# Patient Record
Sex: Female | Born: 1990 | Race: Black or African American | Hispanic: No | Marital: Single | Smoking: Former smoker
Health system: Southern US, Community
[De-identification: ages and names within clinical notes are randomized; demographics above are authoritative.]

## PROBLEM LIST (undated history)

## (undated) ENCOUNTER — Ambulatory Visit: Admission: EM | Payer: Self-pay

## (undated) DIAGNOSIS — R112 Nausea with vomiting, unspecified: Secondary | ICD-10-CM

## (undated) DIAGNOSIS — I10 Essential (primary) hypertension: Secondary | ICD-10-CM

## (undated) DIAGNOSIS — Z8489 Family history of other specified conditions: Secondary | ICD-10-CM

## (undated) DIAGNOSIS — T8859XA Other complications of anesthesia, initial encounter: Secondary | ICD-10-CM

## (undated) DIAGNOSIS — R51 Headache: Secondary | ICD-10-CM

## (undated) DIAGNOSIS — R9431 Abnormal electrocardiogram [ECG] [EKG]: Secondary | ICD-10-CM

## (undated) DIAGNOSIS — I4891 Unspecified atrial fibrillation: Secondary | ICD-10-CM

## (undated) DIAGNOSIS — Z803 Family history of malignant neoplasm of breast: Secondary | ICD-10-CM

## (undated) DIAGNOSIS — K589 Irritable bowel syndrome without diarrhea: Secondary | ICD-10-CM

## (undated) DIAGNOSIS — Z9889 Other specified postprocedural states: Secondary | ICD-10-CM

## (undated) HISTORY — DX: Morbid (severe) obesity due to excess calories: E66.01

## (undated) HISTORY — PX: TONSILLECTOMY: SUR1361

## (undated) HISTORY — DX: Family history of malignant neoplasm of breast: Z80.3

## (undated) HISTORY — PX: CHOLECYSTECTOMY: SHX55

## (undated) HISTORY — DX: Headache: R51

## (undated) HISTORY — PX: HERNIA REPAIR: SHX51

---

## 2016-12-30 ENCOUNTER — Emergency Department (HOSPITAL_COMMUNITY): Payer: Managed Care, Other (non HMO)

## 2016-12-30 ENCOUNTER — Emergency Department (HOSPITAL_COMMUNITY)
Admission: EM | Admit: 2016-12-30 | Discharge: 2016-12-30 | Disposition: A | Discharge status: Home / Self Care | Payer: Managed Care, Other (non HMO) | Attending: Emergency Medicine | Admitting: Emergency Medicine

## 2016-12-30 ENCOUNTER — Encounter (HOSPITAL_COMMUNITY): Payer: Self-pay | Admitting: *Deleted

## 2016-12-30 DIAGNOSIS — I10 Essential (primary) hypertension: Secondary | ICD-10-CM | POA: Diagnosis not present

## 2016-12-30 DIAGNOSIS — R072 Precordial pain: Secondary | ICD-10-CM | POA: Diagnosis present

## 2016-12-30 DIAGNOSIS — R0602 Shortness of breath: Secondary | ICD-10-CM

## 2016-12-30 DIAGNOSIS — F17200 Nicotine dependence, unspecified, uncomplicated: Secondary | ICD-10-CM | POA: Diagnosis not present

## 2016-12-30 HISTORY — DX: Essential (primary) hypertension: I10

## 2016-12-30 LAB — URINALYSIS, ROUTINE W REFLEX MICROSCOPIC
Bilirubin Urine: NEGATIVE
GLUCOSE, UA: NEGATIVE mg/dL
HGB URINE DIPSTICK: NEGATIVE
Ketones, ur: NEGATIVE mg/dL
LEUKOCYTES UA: NEGATIVE
Nitrite: NEGATIVE
PH: 6 (ref 5.0–8.0)
Protein, ur: NEGATIVE mg/dL
SPECIFIC GRAVITY, URINE: 1.023 (ref 1.005–1.030)

## 2016-12-30 LAB — COMPREHENSIVE METABOLIC PANEL
ALT: 19 U/L (ref 14–54)
AST: 22 U/L (ref 15–41)
Albumin: 3.4 g/dL — ABNORMAL LOW (ref 3.5–5.0)
Alkaline Phosphatase: 70 U/L (ref 38–126)
Anion gap: 9 (ref 5–15)
BILIRUBIN TOTAL: 0.4 mg/dL (ref 0.3–1.2)
BUN: 10 mg/dL (ref 6–20)
CHLORIDE: 104 mmol/L (ref 101–111)
CO2: 28 mmol/L (ref 22–32)
CREATININE: 0.76 mg/dL (ref 0.44–1.00)
Calcium: 9.1 mg/dL (ref 8.9–10.3)
GFR calc Af Amer: >60 mL/min (ref >60)
GFR calc non Af Amer: >60 mL/min (ref >60)
Glucose, Bld: 89 mg/dL (ref 65–99)
Potassium: 4.2 mmol/L (ref 3.5–5.1)
Sodium: 141 mmol/L (ref 135–145)
Total Protein: 7 g/dL (ref 6.5–8.1)

## 2016-12-30 LAB — CBC
HEMATOCRIT: 34.3 % — AB (ref 36.0–46.0)
HEMOGLOBIN: 10.8 g/dL — AB (ref 12.0–15.0)
MCH: 19.3 pg — AB (ref 26.0–34.0)
MCHC: 31.5 g/dL (ref 30.0–36.0)
MCV: 61.4 fL — AB (ref 78.0–100.0)
PLATELETS: 242 10*3/uL (ref 150–400)
RBC: 5.59 MIL/uL — AB (ref 3.87–5.11)
RDW: 16.7 % — ABNORMAL HIGH (ref 11.5–15.5)
WBC: 11.4 10*3/uL — ABNORMAL HIGH (ref 4.0–10.5)

## 2016-12-30 LAB — PROTIME-INR
INR: 1.16
PROTHROMBIN TIME: 14.9 s (ref 11.4–15.2)

## 2016-12-30 LAB — I-STAT TROPONIN, ED: Troponin i, poc: 0 ng/mL (ref 0.00–0.08)

## 2016-12-30 LAB — POC URINE PREG, ED: Preg Test, Ur: NEGATIVE

## 2016-12-30 LAB — APTT: APTT: 32 s (ref 24–36)

## 2016-12-30 LAB — D-DIMER, QUANTITATIVE: D-Dimer, Quant: 0.49 ug/mL-FEU (ref 0.00–0.50)

## 2016-12-30 MED ORDER — NAPROXEN 500 MG PO TABS: 500.0000 mg | ORAL_TABLET | Freq: Two times a day (BID) | ORAL | 0 refills | Status: DC

## 2016-12-30 MED ORDER — ACETAMINOPHEN 325 MG PO TABS
650.0000 mg | ORAL_TABLET | Freq: Once | ORAL | Status: AC
  Administered 2016-12-30: 650 mg via ORAL
  Filled 2016-12-30: qty 2

## 2016-12-30 NOTE — Discharge Instructions (Signed)
Please speak to your doctor about looking for other sources of your chest pain. Perhaps a rheumatologist..Marland Kitchen

## 2016-12-30 NOTE — ED Notes (Signed)
Her doctors are in [place]  No record here

## 2016-12-30 NOTE — ED Triage Notes (Signed)
The pt was sent here for a high d-dimer  That started one month ago  She is having chest pain and sob.  Leg pain one week ago.  She had an elevated d-dimer one month nago and earlier today  lmp last week

## 2016-12-30 NOTE — ED Provider Notes (Signed)
[facility] DEPT Provider Note   CSN: [id number] Arrival date & time: 12/30/16  1541     History   Chief Complaint Chief Complaint  Patient presents with  . Chest Pain    HPI Julie King is a 26 y.o. female.  Julie King is a 26 y.o. Female with a history of hypertension who presents to the emergency department complaining of 2 days of substernal chest pain and shortness of breath. She reports she has had this several times in the past and has been ongoing intermittently for at least a year. She had a d-dimer checked by her primary care provider yesterday and was told it was elevated. She was told to come to the emergency department. She tells me she's had elevated d-dimers before and has had previous CT angiograms of her chest. This was most recently done at an outside facility where we do not have records. She also had a CT angiogram and echo performed at [place] last year that showed no evidence of a PE. Denies ever being diagnosed with a pulmonary embolism. She seen a pulmonologist before and was told she has groundglass opacities. She is a smoker. She denies history of PE or DVT. She reports having some mild right calf pain 3 days ago that has since resolved. She denies history of blood clotting disorder such as factor V Leiden, protein C or S deficiency. She denies endogenous estrogen use, history of cancer or recent long travel. She denies recent surgeries. She denies fevers, hemoptysis, abdominal pain, nausea, vomiting, diarrhea, urinary symptoms, rashes, leg swelling, numbness, tingling or weakness.   The history is provided by the patient, medical records and a friend. No language interpreter was used.  Chest Pain   Associated symptoms include shortness of breath. Pertinent negatives include no abdominal pain, no back pain, no cough, no fever, no headaches, no nausea, no numbness, no palpitations, no vomiting and no weakness.    Past Medical History:    Diagnosis Date  . Hypertension     There are no active problems to display for this patient.   History reviewed. No pertinent surgical history.  OB History    No data available       Home Medications    Prior to Admission medications   Medication Sig Start Date End Date Taking? Authorizing Provider  naproxen (NAPROSYN) 500 MG tablet Take 1 tablet (500 mg total) by mouth 2 (two) times daily with a meal. 12/30/16   Waynetta Pean, PA-C    Family History No family history on file.  Social History Social History  Substance Use Topics  . Smoking status: Current Every Day Smoker  . Smokeless tobacco: Never Used  . Alcohol use No     Allergies   Patient has no known allergies.   Review of Systems Review of Systems  Constitutional: Negative for chills and fever.  HENT: Negative for congestion and sore throat.   Eyes: Negative for visual disturbance.  Respiratory: Positive for shortness of breath. Negative for cough, chest tightness and wheezing.   Cardiovascular: Positive for chest pain. Negative for palpitations and leg swelling.  Gastrointestinal: Negative for abdominal pain, diarrhea, nausea and vomiting.  Genitourinary: Negative for dysuria.  Musculoskeletal: Negative for back pain and neck pain.  Skin: Negative for rash.  Neurological: Negative for syncope, weakness, light-headedness, numbness and headaches.     Physical Exam Updated Vital Signs BP (!) 122/59 (BP Location: Right Arm)   Pulse 82   Temp 98.5 F (  36.9 C)   Resp 18   Ht 5' (1.524 m)   Wt 135.2 kg   LMP 12/26/2016   SpO2 100%   BMI 58.20 kg/m   Physical Exam  Constitutional: She is oriented to person, place, and time. She appears well-developed and well-nourished. No distress.  Nontoxic-appearing. Obese female.  HENT:  Head: Normocephalic and atraumatic.  Mouth/Throat: Oropharynx is clear and moist.  Eyes: Conjunctivae are normal. Pupils are equal, round, and reactive to light. Right  eye exhibits no discharge. Left eye exhibits no discharge.  Neck: Neck supple.  Cardiovascular: Normal rate, regular rhythm, normal heart sounds and intact distal pulses.  Exam reveals no gallop and no friction rub.   No murmur heard. Bilateral radial and posterior tibialis pulses are equal and intact.  Pulmonary/Chest: Effort normal and breath sounds normal. No respiratory distress. She has no wheezes. She has no rales. She exhibits tenderness.  Lungs are clear to ascultation bilaterally. Symmetric chest expansion bilaterally. No increased work of breathing. No rales or rhonchi.  Left chest wall is TTP and somewhat reproduces her chest pain.   Abdominal: Soft. There is no tenderness. There is no guarding.  Musculoskeletal: Normal range of motion. She exhibits no edema or tenderness.  No lower extremity edema or tenderness.  Lymphadenopathy:    She has no cervical adenopathy.  Neurological: She is alert and oriented to person, place, and time. No sensory deficit. Coordination normal.  Skin: Skin is warm and dry. Capillary refill takes less than 2 seconds. No rash noted. She is not diaphoretic. No erythema. No pallor.  Psychiatric: She has a normal mood and affect. Her behavior is normal.  Nursing note and vitals reviewed.    ED Treatments / Results  Labs (all labs ordered are listed, but only abnormal results are displayed) Labs Reviewed  COMPREHENSIVE METABOLIC PANEL - Abnormal; Notable for the following:       Result Value   Albumin 3.4 (*)    All other components within normal limits  CBC - Abnormal; Notable for the following:    WBC 11.4 (*)    RBC 5.59 (*)    Hemoglobin 10.8 (*)    HCT 34.3 (*)    MCV 61.4 (*)    MCH 19.3 (*)    RDW 16.7 (*)    All other components within normal limits  URINALYSIS, ROUTINE W REFLEX MICROSCOPIC  D-DIMER, QUANTITATIVE (NOT AT [facility])  PROTIME-INR  APTT  POC URINE PREG, ED  I-STAT TROPOININ, ED    EKG  EKG  Interpretation  Date/Time:  Friday Dec 30 2016 20:49:18 EDT Ventricular Rate:  91 PR Interval:    QRS Duration: 97 QT Interval:  364 QTC Calculation: 448 R Axis:   72 Text Interpretation:  Sinus rhythm Borderline T abnormalities, inferior leads No significant change since last tracing Confirmed by Winfred Leeds  MD, SAM [phone]) on 12/30/2016 10:43:03 PM       Radiology Dg Chest 2 View  Result Date: 12/30/2016 CLINICAL DATA:  Elevated D-dimer.  Shortness of breath and cough. EXAM: CHEST  2 VIEW COMPARISON:  None. FINDINGS: The heart size and mediastinal contours are within normal limits. Both lungs are clear. The visualized skeletal structures are unremarkable. IMPRESSION: No active cardiopulmonary disease. Electronically Signed   By: Dorise Bullion III M.D   On: 12/30/2016 21:24    Procedures Procedures (including critical care time)  Medications Ordered in ED Medications  acetaminophen (TYLENOL) tablet 650 mg (not administered)     Initial Impression /  Assessment and Plan / ED Course  I have reviewed the triage vital signs and the nursing notes.  Pertinent labs & imaging results that were available during my care of the patient were reviewed by me and considered in my medical decision making (see chart for details).    This  is a 26 y.o. Female with a history of hypertension who presents to the emergency department complaining of 2 days of substernal chest pain and shortness of breath. She reports she has had this several times in the past and has been ongoing intermittently for at least a year. She had a d-dimer checked by her primary care provider yesterday and was told it was elevated. She was told to come to the emergency department. She tells me she's had elevated d-dimers before and has had previous CT angiograms of her chest. This was most recently done at an outside facility where we do not have records. She also had a CT angiogram and echo performed at [place] last year that showed no evidence of a PE. Denies ever being diagnosed with a pulmonary embolism.  She denies any risk factors for a PE.  On exam the patient is afebrile nontoxic appearing. She has no tachypnea, hypoxia or tachycardia on exam. Lungs are clear to auscultation bilaterally. She has some left-sided chest wall tenderness to palpation which somewhat reproduces her pain. She has no lower extremity edema or tenderness. EKG is without acute findings. No evidence of STEMI. Troponin is not elevated. I see no need for repeat troponins patient has been having this pain for more than 24 hours. I also have low suspicion for ACS. Pregnancy test is negative. CMP is unremarkable. CBC is remarkable for a white count of 11,000 and a hgb of 10.8. D-dimer here is nonelevated. Chest x-ray is unremarkable. I reviewed records from [facility] from last year which showed a CT angiogram without evidence of PE. Patient also reports she had a CT Angiogram of her chest this past March which showed no evidence of a PE. Here her d-dimer is not elevated. She has no PE risk factors. She describes a pleuritic-like pain and has pain with palpation of her chest. I question if patient has a rheumatological disorder that has been causing her symptoms of chest pain and shortness of breath for over the past year. Patient reports she has not had workup for this in the past. I would expect this to be her next step for her primary care doctor. Possible referral to rheumatology. I discussed that I did not feel a CT angiogram of her chest was necessary at this time and that the risk with radiation outweighs its benefit. Patient agrees with this plan. Naproxen for pain at home. I discussed strict and specific return precautions with the patient. I advised the patient to follow-up with their primary care provider this week. I advised the patient to return to the emergency department with new or worsening symptoms or new  concerns. The patient verbalized understanding and agreement with plan.    This patient was discussed with Dr. Winfred Leeds who agrees with assessment and plan.   Final Clinical Impressions(s) / ED Diagnoses   Final diagnoses:  Precordial pain  Shortness of breath    New Prescriptions New Prescriptions   NAPROXEN (NAPROSYN) 500 MG TABLET    Take 1 tablet (500 mg total) by mouth 2 (two) times daily with a meal.     Waynetta Pean, PA-C 12/30/16 2243  Orlie Dakin, MD 12/31/16 [phone]

## 2016-12-30 NOTE — ED Notes (Signed)
PT states understanding of care given, follow up care, and medication prescribed. PT ambulated from ED to car with a steady gait. 

## 2017-01-22 ENCOUNTER — Emergency Department (HOSPITAL_COMMUNITY): Payer: Managed Care, Other (non HMO)

## 2017-01-22 ENCOUNTER — Encounter (HOSPITAL_COMMUNITY): Payer: Self-pay

## 2017-01-22 ENCOUNTER — Emergency Department (HOSPITAL_COMMUNITY)
Admission: EM | Admit: 2017-01-22 | Discharge: 2017-01-22 | Disposition: A | Discharge status: Home / Self Care | Payer: Managed Care, Other (non HMO) | Attending: Emergency Medicine | Admitting: Emergency Medicine

## 2017-01-22 DIAGNOSIS — F17210 Nicotine dependence, cigarettes, uncomplicated: Secondary | ICD-10-CM | POA: Insufficient documentation

## 2017-01-22 DIAGNOSIS — I10 Essential (primary) hypertension: Secondary | ICD-10-CM | POA: Diagnosis not present

## 2017-01-22 DIAGNOSIS — R51 Headache: Secondary | ICD-10-CM | POA: Insufficient documentation

## 2017-01-22 DIAGNOSIS — R519 Headache, unspecified: Secondary | ICD-10-CM

## 2017-01-22 LAB — CBC WITH DIFFERENTIAL/PLATELET
BASOS ABS: 0 10*3/uL (ref 0.0–0.1)
Basophils Relative: 0 %
EOS ABS: 0.5 10*3/uL (ref 0.0–0.7)
Eosinophils Relative: 4 %
HEMATOCRIT: 33.2 % — AB (ref 36.0–46.0)
Hemoglobin: 10.5 g/dL — ABNORMAL LOW (ref 12.0–15.0)
LYMPHS ABS: 3.5 10*3/uL (ref 0.7–4.0)
Lymphocytes Relative: 30 %
MCH: 19.4 pg — ABNORMAL LOW (ref 26.0–34.0)
MCHC: 31.6 g/dL (ref 30.0–36.0)
MCV: 61.5 fL — ABNORMAL LOW (ref 78.0–100.0)
MONO ABS: 0.6 10*3/uL (ref 0.1–1.0)
Monocytes Relative: 5 %
NEUTROS ABS: 7 10*3/uL (ref 1.7–7.7)
Neutrophils Relative %: 61 %
PLATELETS: 215 10*3/uL (ref 150–400)
RBC: 5.4 MIL/uL — AB (ref 3.87–5.11)
RDW: 16.3 % — AB (ref 11.5–15.5)
WBC: 11.6 10*3/uL — AB (ref 4.0–10.5)

## 2017-01-22 LAB — I-STAT BETA HCG BLOOD, ED (MC, WL, AP ONLY): I-stat hCG, quantitative: <5 m[IU]/mL (ref <5)

## 2017-01-22 LAB — BASIC METABOLIC PANEL
Anion gap: 5 (ref 5–15)
BUN: 10 mg/dL (ref 6–20)
CALCIUM: 9 mg/dL (ref 8.9–10.3)
CO2: 28 mmol/L (ref 22–32)
CREATININE: 0.72 mg/dL (ref 0.44–1.00)
Chloride: 106 mmol/L (ref 101–111)
GFR calc non Af Amer: >60 mL/min (ref >60)
Glucose, Bld: 102 mg/dL — ABNORMAL HIGH (ref 65–99)
Potassium: 4.3 mmol/L (ref 3.5–5.1)
SODIUM: 139 mmol/L (ref 135–145)

## 2017-01-22 MED ORDER — METOCLOPRAMIDE HCL 5 MG/ML IJ SOLN
10.0000 mg | Freq: Once | INTRAMUSCULAR | Status: AC
  Administered 2017-01-22: 10 mg via INTRAVENOUS
  Filled 2017-01-22: qty 2

## 2017-01-22 MED ORDER — DIPHENHYDRAMINE HCL 50 MG/ML IJ SOLN
25.0000 mg | Freq: Once | INTRAMUSCULAR | Status: AC
  Administered 2017-01-22: 25 mg via INTRAVENOUS
  Filled 2017-01-22: qty 1

## 2017-01-22 MED ORDER — SODIUM CHLORIDE 0.9 % IV BOLUS (SEPSIS)
1000.0000 mL | Freq: Once | INTRAVENOUS | Status: AC
  Administered 2017-01-22: 1000 mL via INTRAVENOUS

## 2017-01-22 NOTE — ED Provider Notes (Signed)
[facility] DEPT Provider Note   CSN: [id number] Arrival date & time: 01/22/17  0255     History   Chief Complaint Chief Complaint  Patient presents with  . Headache    HPI Julie King is a 26 y.o. female.  The history is provided by the patient and medical records.  Headache      26 y.o. F with hx of HTN Presenting to the ED with headache. Patient reports this is been ongoing for about 10 days now. States headache is localized to the top left of her head. States pain is pulsatile and throbbing in nature. She reports associated dizziness that is worse when she comes to complete stop after walking around or driving her car. She's not had any nausea or vomiting. She's not had any numbness or weakness of her arms or legs but does report some tingling in her hands and feet bilaterally. She also reports some "memory issues". States lately she has been very forgetful, specifically forgetting that she is cooking and will burn her food in the kitchen.  States sometimes when talking she is having trouble "getting her words out".  States she has had migraines in the past, but has never been fully diagnosed with them. States usually with headache she does not have these other associated issues. States she has been taking Excedrin, Tylenol, and Motrin which will "take the edge off" but her headache has never fully resolved.  She is not currently on anti-coagulation.  No recent head trauma.  Past Medical History:  Diagnosis Date  . Hypertension     There are no active problems to display for this patient.   History reviewed. No pertinent surgical history.  OB History    No data available       Home Medications    Prior to Admission medications   Medication Sig Start Date End Date Taking? Authorizing Provider  naproxen (NAPROSYN) 500 MG tablet Take 1 tablet (500 mg total) by mouth 2 (two) times daily with a meal. 12/30/16   Waynetta Pean, PA-C    Family History History  reviewed. No pertinent family history.  Social History Social History  Substance Use Topics  . Smoking status: Current Every Day Smoker  . Smokeless tobacco: Never Used  . Alcohol use No     Allergies   Patient has no known allergies.   Review of Systems Review of Systems  Neurological: Positive for dizziness and headaches.       +Memory issues  All other systems reviewed and are negative.    Physical Exam Updated Vital Signs BP 133/71   Pulse 74   Temp 98.7 F (37.1 C) (Oral)   Resp 18   Ht 5\' 7"  (1.702 m)   Wt 133.8 kg (295 lb)   LMP 12/26/2016   SpO2 100%   BMI 46.20 kg/m   Physical Exam  Constitutional: She is oriented to person, place, and time. She appears well-developed and well-nourished. No distress.  HENT:  Head: Normocephalic and atraumatic.  Right Ear: External ear normal.  Left Ear: External ear normal.  Mouth/Throat: Oropharynx is clear and moist.  Eyes: Conjunctivae and EOM are normal. Pupils are equal, round, and reactive to light.  Neck: Normal range of motion and full passive range of motion without pain. Neck supple. No neck rigidity.  No rigidity, no meningismus  Cardiovascular: Normal rate, regular rhythm and normal heart sounds.   No murmur heard. Pulmonary/Chest: Effort normal and breath sounds normal. No respiratory distress. She  has no wheezes. She has no rhonchi.  Abdominal: Soft. Bowel sounds are normal. There is no tenderness. There is no rebound and no guarding.  Musculoskeletal: Normal range of motion. She exhibits no edema.  Neurological: She is alert and oriented to person, place, and time. She has normal strength. She displays no tremor. No cranial nerve deficit or sensory deficit. She displays no seizure activity.  AAOx3, answering questions and following commands appropriately; equal strength UE and LE bilaterally; CN grossly intact; moves all extremities appropriately without ataxia; normal finger to nose and heel to shin  bilaterally; normal rapid alternating movements; normal speech, no apparent expressive aphasia, no facial droop, gait steady  Skin: Skin is warm and dry. No rash noted. She is not diaphoretic.  Psychiatric: She has a normal mood and affect. Her behavior is normal. Thought content normal.  Nursing note and vitals reviewed.    ED Treatments / Results  Labs (all labs ordered are listed, but only abnormal results are displayed) Labs Reviewed  CBC WITH DIFFERENTIAL/PLATELET - Abnormal; Notable for the following:       Result Value   WBC 11.6 (*)    RBC 5.40 (*)    Hemoglobin 10.5 (*)    HCT 33.2 (*)    MCV 61.5 (*)    MCH 19.4 (*)    RDW 16.3 (*)    All other components within normal limits  BASIC METABOLIC PANEL - Abnormal; Notable for the following:    Glucose, Bld 102 (*)    All other components within normal limits  I-STAT BETA HCG BLOOD, ED ([facility], [facility], [facility] ONLY)    EKG  EKG Interpretation None       Radiology Ct Head Wo Contrast  Result Date: 01/22/2017 CLINICAL DATA:  Headache, memory issues, dizziness x 1.5 weeks EXAM: CT HEAD WITHOUT CONTRAST TECHNIQUE: Contiguous axial images were obtained from the base of the skull through the vertex without intravenous contrast. COMPARISON:  None. FINDINGS: Brain: No evidence of acute infarction, hemorrhage, hydrocephalus, extra-axial collection or mass lesion/mass effect. Vascular: No hyperdense vessel or unexpected calcification. Skull: Normal. Negative for fracture or focal lesion. Sinuses/Orbits: The visualized paranasal sinuses are essentially clear. The mastoid air cells are unopacified. Other: None. IMPRESSION: Normal head CT. Electronically Signed   By: Julian Hy M.D.   On: 01/22/2017 07:24    Procedures Procedures (including critical care time)  Medications Ordered in ED Medications  diphenhydrAMINE (BENADRYL) injection 25 mg (not administered)  metoCLOPramide (REGLAN) injection 10 mg (not administered)  sodium  chloride 0.9 % bolus 1,000 mL (not administered)     Initial Impression / Assessment and Plan / ED Course  I have reviewed the triage vital signs and the nursing notes.  Pertinent labs & imaging results that were available during my care of the patient were reviewed by me and considered in my medical decision making (see chart for details).  26 year old female here with headache, dizziness, some memory issues. On exam she is afebrile and nontoxic. Her neurologic exam is nonfocal. She has no signs or symptoms concerning for meningitis. She has no apparent word finding difficulties or trouble with memory recall she is able to give quite an elaborate history. She has been ambulatory with steady gait here.  We'll plan for basic labs, CT head. Migraine cocktail given.  Labwork is overall reassuring. CT head negative for acute findings. Headache resolved after migraine cocktail. Suspect this may be a complicated migraine. She remains neurologically intact here. Will have her follow-up  with outpatient neurology.  Discussed plan with patient, she acknowledged understanding and agreed with plan of care.  Return precautions given for new or worsening symptoms.  Final Clinical Impressions(s) / ED Diagnoses   Final diagnoses:  Nonintractable headache, unspecified chronicity pattern, unspecified headache type    New Prescriptions Discharge Medication List as of 01/22/2017  8:05 AM       Larene Pickett, PA-C 01/22/17 [place], April, MD 01/23/17 0004

## 2017-01-22 NOTE — Discharge Instructions (Signed)
Your lab work and head CT did not show anything emergent or concerning today. Follow-up with neurology if you continue having ongoing symptoms-- call their office to make an appt. Return to the ED for new or worsening symptoms.

## 2017-01-22 NOTE — ED Triage Notes (Signed)
Pt complaining of headache x 2 weeks. Pt states dizziness and lightheadedness x 2 weeks. Pt denies any hx of migraines. Pt denies any chest pain/SOB. Pt a/o x 4 at triage, NAD.

## 2017-02-15 ENCOUNTER — Emergency Department (HOSPITAL_COMMUNITY)
Admission: EM | Admit: 2017-02-15 | Discharge: 2017-02-15 | Disposition: A | Discharge status: Home / Self Care | Payer: Managed Care, Other (non HMO) | Attending: Emergency Medicine | Admitting: Emergency Medicine

## 2017-02-15 ENCOUNTER — Emergency Department (HOSPITAL_COMMUNITY)
Admission: EM | Admit: 2017-02-15 | Discharge: 2017-02-15 | Disposition: A | Discharge status: Home / Self Care | Payer: Managed Care, Other (non HMO) | Source: Home / Self Care

## 2017-02-15 ENCOUNTER — Encounter (HOSPITAL_COMMUNITY): Payer: Self-pay | Admitting: Emergency Medicine

## 2017-02-15 DIAGNOSIS — K0889 Other specified disorders of teeth and supporting structures: Secondary | ICD-10-CM

## 2017-02-15 DIAGNOSIS — I10 Essential (primary) hypertension: Secondary | ICD-10-CM | POA: Insufficient documentation

## 2017-02-15 DIAGNOSIS — Z5321 Procedure and treatment not carried out due to patient leaving prior to being seen by health care provider: Secondary | ICD-10-CM

## 2017-02-15 DIAGNOSIS — F17200 Nicotine dependence, unspecified, uncomplicated: Secondary | ICD-10-CM | POA: Diagnosis not present

## 2017-02-15 MED ORDER — PENICILLIN V POTASSIUM 500 MG PO TABS: 500.0000 mg | ORAL_TABLET | Freq: Four times a day (QID) | ORAL | 0 refills | Status: AC

## 2017-02-15 MED ORDER — HYDROCODONE-ACETAMINOPHEN 5-325 MG PO TABS: 1.0000 | ORAL_TABLET | ORAL | 0 refills | Status: DC | PRN

## 2017-02-15 MED ORDER — PENICILLIN V POTASSIUM 500 MG PO TABS
500.0000 mg | ORAL_TABLET | Freq: Once | ORAL | Status: AC
  Administered 2017-02-15: 500 mg via ORAL
  Filled 2017-02-15: qty 1

## 2017-02-15 NOTE — ED Notes (Signed)
Pt refused vitals stating she was in a hurry and needed to go

## 2017-02-15 NOTE — ED Triage Notes (Signed)
Pt is c/o dental pain  Pt states she has an abscessed tooth on the upper left   Pt states she has taken OTC medication without relief  Pt states the pain goes into her neck and left ear  Pt states it is hard to hear out of her left ear

## 2017-02-15 NOTE — ED Triage Notes (Signed)
Patient with dental pain on left lower jaw.  Face is slightly swollen, she states she has had the same dental infection for the last 5 years.  Patient does have a dentist.  She has taken Excedrin with no help in the pain.

## 2017-02-15 NOTE — Discharge Instructions (Signed)
You have a dental injury/infection. It is very important that you get evaluated by a dentist as soon as possible. Call when they open today to schedule an appointment. Take your full course of antibiotics. Read the instructions below.  Eat a soft or liquid diet and rinse your mouth out after meals with warm water. You should see a dentist or return here at once if you have increased swelling, increased pain or uncontrolled bleeding from the site of your injury.  SEEK MEDICAL CARE IF:  You have increased pain not controlled with medicines.  You have swelling around your tooth, in your face or neck.  You have bleeding which starts, continues, or gets worse.  You have a fever >101 If you are unable to open your mouth

## 2017-02-15 NOTE — ED Provider Notes (Signed)
[facility] DEPT Provider Note   CSN: [id number] Arrival date & time: 02/15/17  0231     History   Chief Complaint Chief Complaint  Patient presents with  . Dental Pain    HPI Julie King is a 26 y.o. female.  The history is provided by the patient and medical records. No language interpreter was used.  Dental Pain      Julie King is a 26 y.o. female who presents to the Emergency Department complaining of  gradually worsening, left-sided, lower dental pain which has been occurring off-and-on for the last 5 years, but acutely worsened last night. Pt describes their pain as throbbing. Pt has been taking excederin at home with minimal relief of pain. They are currently followed by dentistry and plan to call them in the morning to schedule an appointment.  Pt denies facial swelling, fever, chills, difficulty breathing, difficulty swallowing.   Past Medical History:  Diagnosis Date  . Hypertension     There are no active problems to display for this patient.   Past Surgical History:  Procedure Laterality Date  . CESAREAN SECTION    . CHOLECYSTECTOMY    . HERNIA REPAIR    . TONSILLECTOMY      OB History    No data available       Home Medications    Prior to Admission medications   Medication Sig Start Date End Date Taking? Authorizing Provider  albuterol (PROVENTIL HFA;VENTOLIN HFA) 108 (90 Base) MCG/ACT inhaler Inhale 1-2 puffs into the lungs every 6 (six) hours as needed for wheezing or shortness of breath.    [provider]  cholecalciferol (VITAMIN D) 1000 units tablet Take 1,000 Units by mouth daily.    [provider]  HYDROcodone-acetaminophen (NORCO/VICODIN) 5-325 MG tablet Take 1 tablet by mouth every 4 (four) hours as needed. 02/15/17   Ward, Ozella Almond, PA-C  penicillin v potassium (VEETID) 500 MG tablet Take 1 tablet (500 mg total) by mouth 4 (four) times daily. 02/15/17 02/22/17  Ward, Ozella Almond, PA-C    Family  History Family History  Problem Relation Age of Onset  . Hypertension Other   . Diabetes Other     Social History Social History  Substance Use Topics  . Smoking status: Current Every Day Smoker  . Smokeless tobacco: Never Used  . Alcohol use No     Allergies   Patient has no known allergies.   Review of Systems Review of Systems  Constitutional: Negative for chills and fever.  HENT: Positive for dental problem. Negative for facial swelling and trouble swallowing.   Respiratory: Negative for shortness of breath.      Physical Exam Updated Vital Signs BP (!) 150/92 (BP Location: Right Arm)   Pulse 92   Temp 98.4 F (36.9 C) (Oral)   Resp 18   Ht 5\' 7"  (1.702 m)   Wt 127 kg (280 lb)   LMP 02/07/2017 (Exact Date)   SpO2 100%   BMI 43.85 kg/m   Physical Exam  Constitutional: She appears well-developed and well-nourished. No distress.  HENT:  Head: Normocephalic and atraumatic.  Mouth/Throat:    Dental cavities and poor oral dentition noted. Pain along tooth as depicted in image. No abscess noted. Midline uvula. No trismus. OP moist and clear. No oropharyngeal erythema or edema. Neck supple with no tenderness. No facial edema.  Neck: Neck supple.  Cardiovascular: Normal rate, regular rhythm and normal heart sounds.   No murmur heard. Pulmonary/Chest: Effort normal and  breath sounds normal. No respiratory distress. She has no wheezes. She has no rales.  Musculoskeletal: Normal range of motion.  Neurological: She is alert.  Skin: Skin is warm and dry.  Nursing note and vitals reviewed.    ED Treatments / Results  Labs (all labs ordered are listed, but only abnormal results are displayed) Labs Reviewed - No data to display  EKG  EKG Interpretation None       Radiology No results found.  Procedures Procedures (including critical care time)  Medications Ordered in ED Medications  penicillin v potassium (VEETID) tablet 500 mg (not administered)      Initial Impression / Assessment and Plan / ED Course  I have reviewed the triage vital signs and the nursing notes.  Pertinent labs & imaging results that were available during my care of the patient were reviewed by me and considered in my medical decision making (see chart for details).    Julie King is a 26 y.o. female who presents to ED for dental pain. She will be seeing dentistry tomorrow but was not able to tolerate pain through the night, prompting her to come to ED. No abscess requiring immediate incision and drainage. Patient is afebrile, non toxic appearing, and swallowing secretions well. Exam not concerning for Ludwig's angina or pharyngeal abscess. Will treat with VK. Offered dental block for temporary relief but patient declined. She is driving, therefore no pain medication was given. I did give her rx for #2 norco to take when she got home for pain relief until her dentist appointment in the morning. Stressed importance of keeping her appointment tomorrow as well. Patient voices understanding and is agreeable to plan.   Final Clinical Impressions(s) / ED Diagnoses   Final diagnoses:  Dentalgia    New Prescriptions New Prescriptions   HYDROCODONE-ACETAMINOPHEN (NORCO/VICODIN) 5-325 MG TABLET    Take 1 tablet by mouth every 4 (four) hours as needed.   PENICILLIN V POTASSIUM (VEETID) 500 MG TABLET    Take 1 tablet (500 mg total) by mouth 4 (four) times daily.     Ward, Ozella Almond, PA-C 79/48/01 6553    Delora Fuel, MD 74/82/70 [phone]

## 2017-02-16 ENCOUNTER — Emergency Department (HOSPITAL_COMMUNITY)
Admission: EM | Admit: 2017-02-16 | Discharge: 2017-02-16 | Disposition: A | Discharge status: Home / Self Care | Payer: Managed Care, Other (non HMO) | Attending: Emergency Medicine | Admitting: Emergency Medicine

## 2017-02-16 ENCOUNTER — Emergency Department (HOSPITAL_COMMUNITY): Payer: Managed Care, Other (non HMO)

## 2017-02-16 ENCOUNTER — Encounter (HOSPITAL_COMMUNITY): Payer: Self-pay | Admitting: Emergency Medicine

## 2017-02-16 DIAGNOSIS — K0889 Other specified disorders of teeth and supporting structures: Secondary | ICD-10-CM | POA: Diagnosis present

## 2017-02-16 DIAGNOSIS — I10 Essential (primary) hypertension: Secondary | ICD-10-CM | POA: Insufficient documentation

## 2017-02-16 DIAGNOSIS — F17210 Nicotine dependence, cigarettes, uncomplicated: Secondary | ICD-10-CM | POA: Diagnosis not present

## 2017-02-16 DIAGNOSIS — Z7951 Long term (current) use of inhaled steroids: Secondary | ICD-10-CM | POA: Diagnosis not present

## 2017-02-16 NOTE — ED Triage Notes (Signed)
Pt acuity 3 due to neck swelling and difficulty swallowing

## 2017-02-16 NOTE — ED Notes (Signed)
Pt. Transported to xray 

## 2017-02-16 NOTE — Discharge Instructions (Signed)
Please obtain a primary care provider.  Please follow up with your dentist regarding your symptoms.  It may take a day or two for the antibiotics to work.  If you aren't feeling better or get significantly worse please seek additional medical evaluation.    You may have diarrhea from the antibiotics.  Please stay well hydrated and consider probiotics as they may decrease the severity of your diarrhea.  Please be aware that if you take any hormonal contraception (birth control pills, nexplanon, the ring, etc) that your birth control will not work while you are taking antibiotics and you need to use back up protection as directed on the birth control medication information insert.

## 2017-02-16 NOTE — ED Notes (Signed)
PA at the bedside.

## 2017-02-16 NOTE — ED Triage Notes (Signed)
Pt states she has had a dental infection for several years. Went to dentist yesterday for ongoing pain. Dentist gave her antibiotic. Pt woke up today with increased swelling in left lower jaw and into her neck. Pt states it is getting harder to swallow. Airway intact, no difficulty breathing

## 2017-02-16 NOTE — ED Notes (Signed)
Pt. Returned from xray 

## 2017-02-16 NOTE — ED Provider Notes (Signed)
[facility] DEPT Provider Note   CSN: [id number] Arrival date & time: 02/16/17  1041     History   Chief Complaint Chief Complaint  Patient presents with  . Dental Problem    HPI Julie King is a 26 y.o. female Who presents with facial swelling.  She was seen in ED yesterday early morning for Dental pain in her left lower jaw.  She was started on PEN VK and saw a dentist yesterday afternoon.  She reports that they didn't do anything.  She went back to the dentist this morning after she noticed the left side of her face swelling.  She reports she has taken 6 doses of PCN so far.  The dentist "didn't do anything except change my antibiotics to clindamycin."  She has not yet started taking her clindamycin.  She is concerned that she feels like it is hard to swallow and that her swelling is below her jaw line.  She is not drooling, no fevers at home.  No SOB.  HPI  Past Medical History:  Diagnosis Date  . Hypertension     There are no active problems to display for this patient.   Past Surgical History:  Procedure Laterality Date  . CESAREAN SECTION    . CHOLECYSTECTOMY    . HERNIA REPAIR    . TONSILLECTOMY      OB History    No data available       Home Medications    Prior to Admission medications   Medication Sig Start Date End Date Taking? Authorizing Provider  albuterol (PROVENTIL HFA;VENTOLIN HFA) 108 (90 Base) MCG/ACT inhaler Inhale 1-2 puffs into the lungs every 6 (six) hours as needed for wheezing or shortness of breath.    [provider]  cholecalciferol (VITAMIN D) 1000 units tablet Take 1,000 Units by mouth daily.    [provider]  HYDROcodone-acetaminophen (NORCO/VICODIN) 5-325 MG tablet Take 1 tablet by mouth every 4 (four) hours as needed. 02/15/17   Ward, Ozella Almond, PA-C  penicillin v potassium (VEETID) 500 MG tablet Take 1 tablet (500 mg total) by mouth 4 (four) times daily. 02/15/17 02/22/17  Ward, Ozella Almond, PA-C     Family History Family History  Problem Relation Age of Onset  . Hypertension Other   . Diabetes Other     Social History Social History  Substance Use Topics  . Smoking status: Current Every Day Smoker  . Smokeless tobacco: Never Used  . Alcohol use Yes     Allergies   Patient has no known allergies.   Review of Systems Review of Systems  Constitutional: Positive for chills. Negative for fatigue and fever.  HENT: Positive for dental problem, facial swelling and trouble swallowing. Negative for congestion, drooling, sore throat and voice change.   Respiratory: Negative for cough, chest tightness and shortness of breath.   Cardiovascular: Negative for chest pain.  Gastrointestinal: Negative for abdominal pain, diarrhea, nausea and vomiting.  Genitourinary: Negative for difficulty urinating and dysuria.  Musculoskeletal: Negative for arthralgias and myalgias.  Skin: Negative for color change, rash and wound.  Neurological: Negative for dizziness, light-headedness, numbness and headaches.     Physical Exam Updated Vital Signs BP 111/69 (BP Location: Left Arm)   Pulse 73   Temp 98.9 F (37.2 C) (Oral)   Resp 16   Ht 5\' 7"  (1.702 m)   Wt 122.5 kg (270 lb)   LMP 02/07/2017 (Exact Date)   SpO2 100%   BMI 42.29 kg/m  Physical Exam  Constitutional: She appears well-developed and well-nourished. No distress.  HENT:  Head: Atraumatic.  Right Ear: External ear normal.  Left Ear: External ear normal.  Nose: Nose normal.  Mouth/Throat: Oropharynx is clear and moist. No oropharyngeal exudate.  Mild left sided facial swelling that is TTP to left sided face around mandible.  No obvious abscess.  Area is indurated, not fluctuant.  No elevation to floor of mouth.  Patient is speaking clearly, uvula is symmetric.  No neck rigidity.  Trachea is midline, non tender.    Eyes: Conjunctivae are normal. Right eye exhibits no discharge. Left eye exhibits no discharge.  Neck:  Normal range of motion. Neck supple. No JVD present. No tracheal deviation present.  Cardiovascular: Normal rate, regular rhythm, normal heart sounds and intact distal pulses.  Exam reveals no friction rub.   No murmur heard. Pulmonary/Chest: Effort normal and breath sounds normal. No stridor. No respiratory distress.  Abdominal: Soft. She exhibits no distension. There is no tenderness.  Lymphadenopathy:    She has cervical adenopathy (Mild left submandibular).  Neurological: She is alert. She exhibits normal muscle tone.  Skin: Skin is warm and dry. She is not diaphoretic.  Psychiatric: Her behavior is normal.  Nursing note and vitals reviewed.    ED Treatments / Results  Labs (all labs ordered are listed, but only abnormal results are displayed) Labs Reviewed - No data to display  EKG  EKG Interpretation None       Radiology Dg Neck Soft Tissue  Result Date: 02/16/2017 CLINICAL DATA:  Left mandibular mass.  Difficulty swallowing. EXAM: NECK SOFT TISSUES - 1+ VIEW COMPARISON:  None. FINDINGS: The epiglottis and area epiglottic folds appear normal. Slightly prominent lymphoid tissue noted at the base of tongue. No abnormal prevertebral soft tissue swelling or worrisome air collections. The trachea appears normal. The cervical vertebral bodies appear normal. The lung apices are clear. IMPRESSION: Unremarkable soft tissue examination of the neck. Electronically Signed   By: Marijo Sanes M.D.   On: 02/16/2017 12:43    Procedures Procedures (including critical care time)  Medications Ordered in ED Medications - No data to display   Initial Impression / Assessment and Plan / ED Course  I have reviewed the triage vital signs and the nursing notes.  Pertinent labs & imaging results that were available during my care of the patient were reviewed by me and considered in my medical decision making (see chart for details).    Patient with toothache.  No gross abscess.  Exam  unconcerning for Ludwig's angina or deep space infection. Superficial swelling to left face with normal x-ray soft tissue.  No evidence of airway compromise.  Dentist this morning changed antibiotics to clindamycin which I feel is appropriate.  Urged patient to follow-up with dentist.     Final Clinical Impressions(s) / ED Diagnoses   Final diagnoses:  Pain, dental    New Prescriptions Discharge Medication List as of 02/16/2017  1:00 PM       Lorin Glass, PA-C 02/16/17 1322    Lajean Saver, MD 02/16/17 1427

## 2017-02-16 NOTE — ED Notes (Signed)
Pt given [facility] for Follow-up Care

## 2017-03-26 ENCOUNTER — Emergency Department (HOSPITAL_COMMUNITY): Payer: Managed Care, Other (non HMO)

## 2017-03-26 ENCOUNTER — Encounter (HOSPITAL_COMMUNITY): Payer: Self-pay | Admitting: Emergency Medicine

## 2017-03-26 ENCOUNTER — Emergency Department (HOSPITAL_COMMUNITY)
Admission: EM | Admit: 2017-03-26 | Discharge: 2017-03-26 | Disposition: A | Discharge status: Home / Self Care | Payer: Managed Care, Other (non HMO) | Attending: Emergency Medicine | Admitting: Emergency Medicine

## 2017-03-26 DIAGNOSIS — F17210 Nicotine dependence, cigarettes, uncomplicated: Secondary | ICD-10-CM | POA: Insufficient documentation

## 2017-03-26 DIAGNOSIS — I10 Essential (primary) hypertension: Secondary | ICD-10-CM | POA: Insufficient documentation

## 2017-03-26 DIAGNOSIS — N83202 Unspecified ovarian cyst, left side: Secondary | ICD-10-CM | POA: Diagnosis not present

## 2017-03-26 DIAGNOSIS — R102 Pelvic and perineal pain: Secondary | ICD-10-CM | POA: Diagnosis present

## 2017-03-26 DIAGNOSIS — N83209 Unspecified ovarian cyst, unspecified side: Secondary | ICD-10-CM

## 2017-03-26 HISTORY — DX: Irritable bowel syndrome, unspecified: K58.9

## 2017-03-26 LAB — CBC
HEMATOCRIT: 33.7 % — AB (ref 36.0–46.0)
Hemoglobin: 10.8 g/dL — ABNORMAL LOW (ref 12.0–15.0)
MCH: 19.6 pg — ABNORMAL LOW (ref 26.0–34.0)
MCHC: 32 g/dL (ref 30.0–36.0)
MCV: 61.2 fL — AB (ref 78.0–100.0)
Platelets: 195 10*3/uL (ref 150–400)
RBC: 5.51 MIL/uL — AB (ref 3.87–5.11)
RDW: 16.2 % — AB (ref 11.5–15.5)
WBC: 14.2 10*3/uL — AB (ref 4.0–10.5)

## 2017-03-26 LAB — COMPREHENSIVE METABOLIC PANEL
ALBUMIN: 3.6 g/dL (ref 3.5–5.0)
ALT: 18 U/L (ref 14–54)
AST: 31 U/L (ref 15–41)
Alkaline Phosphatase: 63 U/L (ref 38–126)
Anion gap: 9 (ref 5–15)
BUN: 10 mg/dL (ref 6–20)
CHLORIDE: 105 mmol/L (ref 101–111)
CO2: 24 mmol/L (ref 22–32)
CREATININE: 0.83 mg/dL (ref 0.44–1.00)
Calcium: 8.8 mg/dL — ABNORMAL LOW (ref 8.9–10.3)
GFR calc Af Amer: >60 mL/min (ref >60)
GFR calc non Af Amer: >60 mL/min (ref >60)
GLUCOSE: 90 mg/dL (ref 65–99)
POTASSIUM: 5 mmol/L (ref 3.5–5.1)
SODIUM: 138 mmol/L (ref 135–145)
Total Bilirubin: 0.6 mg/dL (ref 0.3–1.2)
Total Protein: 7.2 g/dL (ref 6.5–8.1)

## 2017-03-26 LAB — URINALYSIS, ROUTINE W REFLEX MICROSCOPIC
BACTERIA UA: NONE SEEN
GLUCOSE, UA: NEGATIVE mg/dL
Hgb urine dipstick: NEGATIVE
KETONES UR: NEGATIVE mg/dL
Leukocytes, UA: NEGATIVE
Nitrite: NEGATIVE
Protein, ur: 30 mg/dL — AB
Specific Gravity, Urine: 1.031 — ABNORMAL HIGH (ref 1.005–1.030)
pH: 7 (ref 5.0–8.0)

## 2017-03-26 LAB — LIPASE, BLOOD: Lipase: 33 U/L (ref 11–51)

## 2017-03-26 LAB — PREGNANCY, URINE: Preg Test, Ur: NEGATIVE

## 2017-03-26 MED ORDER — IBUPROFEN 800 MG PO TABS: 800.0000 mg | ORAL_TABLET | Freq: Three times a day (TID) | ORAL | 0 refills | Status: DC | PRN

## 2017-03-26 MED ORDER — SODIUM CHLORIDE 0.9 % IV BOLUS (SEPSIS)
1000.0000 mL | Freq: Once | INTRAVENOUS | Status: AC
  Administered 2017-03-26: 1000 mL via INTRAVENOUS

## 2017-03-26 MED ORDER — SODIUM CHLORIDE 0.9 % IV BOLUS (SEPSIS)
500.0000 mL | Freq: Once | INTRAVENOUS | Status: AC
  Administered 2017-03-26: 500 mL via INTRAVENOUS

## 2017-03-26 MED ORDER — KETOROLAC TROMETHAMINE 30 MG/ML IJ SOLN
30.0000 mg | Freq: Once | INTRAMUSCULAR | Status: AC
  Administered 2017-03-26: 30 mg via INTRAVENOUS
  Filled 2017-03-26: qty 1

## 2017-03-26 MED ORDER — ONDANSETRON HCL 4 MG/2ML IJ SOLN
4.0000 mg | Freq: Once | INTRAMUSCULAR | Status: AC
  Administered 2017-03-26: 4 mg via INTRAVENOUS
  Filled 2017-03-26: qty 2

## 2017-03-26 MED ORDER — IOPAMIDOL (ISOVUE-300) INJECTION 61%
INTRAVENOUS | Status: AC
  Administered 2017-03-26: 100 mL via INTRAVENOUS
  Filled 2017-03-26: qty 100

## 2017-03-26 NOTE — ED Provider Notes (Signed)
[facility] DEPT Provider Note   CSN: [id number] Arrival date & time: 03/26/17  1742     History   Chief Complaint Chief Complaint  Patient presents with  . Rectal Pain  . Dysuria  . Diarrhea  . Pelvic Pain    HPI Jennessa Trigo is a 26 y.o. female.  Patient complains of lower abdominal discomfort since this morning.  No vomiting some nausea   The history is provided by the patient. No language interpreter was used.  Abdominal Pain   This is a new problem. The current episode started 6 to 12 hours ago. The problem occurs constantly. The problem has not changed since onset.The pain is associated with an unknown factor. The pain is located in the RLQ and LUQ. The quality of the pain is aching. The pain is at a severity of 5/10. The pain is moderate. Pertinent negatives include anorexia, diarrhea, frequency, hematuria and headaches. Nothing aggravates the symptoms. Nothing relieves the symptoms.    Past Medical History:  Diagnosis Date  . Hypertension   . Irritable bowel syndrome     There are no active problems to display for this patient.   Past Surgical History:  Procedure Laterality Date  . CESAREAN SECTION    . CHOLECYSTECTOMY    . HERNIA REPAIR    . TONSILLECTOMY      OB History    No data available       Home Medications    Prior to Admission medications   Medication Sig Start Date End Date Taking? Authorizing Provider  albuterol (PROVENTIL HFA;VENTOLIN HFA) 108 (90 Base) MCG/ACT inhaler Inhale 1-2 puffs into the lungs every 6 (six) hours as needed for wheezing or shortness of breath.   Yes [provider]  HYDROcodone-acetaminophen (NORCO/VICODIN) 5-325 MG tablet Take 1 tablet by mouth every 4 (four) hours as needed. Patient not taking: Reported on 03/26/2017 02/15/17   Ward, Ozella Almond, PA-C  ibuprofen (ADVIL,MOTRIN) 800 MG tablet Take 1 tablet (800 mg total) by mouth every 8 (eight) hours as needed for moderate pain. 03/26/17   Milton Ferguson, MD    Family History Family History  Problem Relation Age of Onset  . Hypertension Other   . Diabetes Other     Social History Social History  Substance Use Topics  . Smoking status: Current Every Day Smoker    Packs/day: 0.50    Types: Cigarettes  . Smokeless tobacco: Never Used  . Alcohol use Yes     Allergies   Percocet [oxycodone-acetaminophen]   Review of Systems Review of Systems  Constitutional: Negative for appetite change and fatigue.  HENT: Negative for congestion, ear discharge and sinus pressure.   Eyes: Negative for discharge.  Respiratory: Negative for cough.   Cardiovascular: Negative for chest pain.  Gastrointestinal: Positive for abdominal pain. Negative for anorexia and diarrhea.  Genitourinary: Negative for frequency and hematuria.  Musculoskeletal: Negative for back pain.  Skin: Negative for rash.  Neurological: Negative for seizures and headaches.  Psychiatric/Behavioral: Negative for hallucinations.     Physical Exam Updated Vital Signs BP 98/62 (BP Location: Left Arm)   Pulse 85   Temp 98 F (36.7 C) (Oral)   Resp 20   LMP 03/10/2017 (Exact Date)   SpO2 97%   Physical Exam  Constitutional: She is oriented to person, place, and time. She appears well-developed.  HENT:  Head: Normocephalic.  Eyes: Conjunctivae and EOM are normal. No scleral icterus.  Neck: Neck supple. No thyromegaly present.  Cardiovascular: Normal  rate and regular rhythm.  Exam reveals no gallop and no friction rub.   No murmur heard. Pulmonary/Chest: No stridor. She has no wheezes. She has no rales. She exhibits no tenderness.  Abdominal: She exhibits no distension. There is tenderness. There is no rebound.  Musculoskeletal: Normal range of motion. She exhibits no edema.  Lymphadenopathy:    She has no cervical adenopathy.  Neurological: She is oriented to person, place, and time. She exhibits normal muscle tone. Coordination normal.  Skin: No rash  noted. No erythema.  Psychiatric: She has a normal mood and affect. Her behavior is normal.     ED Treatments / Results  Labs (all labs ordered are listed, but only abnormal results are displayed) Labs Reviewed  COMPREHENSIVE METABOLIC PANEL - Abnormal; Notable for the following:       Result Value   Calcium 8.8 (*)    All other components within normal limits  URINALYSIS, ROUTINE W REFLEX MICROSCOPIC - Abnormal; Notable for the following:    Specific Gravity, Urine 1.031 (*)    Bilirubin Urine SMALL (*)    Protein, ur 30 (*)    Squamous Epithelial / LPF 0-5 (*)    All other components within normal limits  CBC - Abnormal; Notable for the following:    WBC 14.2 (*)    RBC 5.51 (*)    Hemoglobin 10.8 (*)    HCT 33.7 (*)    MCV 61.2 (*)    MCH 19.6 (*)    RDW 16.2 (*)    All other components within normal limits  LIPASE, BLOOD  PREGNANCY, URINE    EKG  EKG Interpretation None       Radiology Ct Abdomen Pelvis W Contrast  Result Date: 03/26/2017 CLINICAL DATA:  26 year female with rectal and pelvic pain. An episode of diarrhea. EXAM: CT ABDOMEN AND PELVIS WITH CONTRAST TECHNIQUE: Multidetector CT imaging of the abdomen and pelvis was performed using the standard protocol following bolus administration of intravenous contrast. CONTRAST:  <See Chart> ISOVUE-300 IOPAMIDOL (ISOVUE-300) INJECTION 61% COMPARISON:  None. FINDINGS: Lower chest: The visualized lung bases are clear. No intra-abdominal free air. Small amount of slightly high attenuating fluid within the pelvis, likely hemorrhagic fluid or blood product. Hepatobiliary: There is apparent mild diffuse fatty infiltration of the liver. There is mild intrahepatic biliary ductal dilatation, likely post cholecystectomy changes. Pancreas: Unremarkable. No pancreatic ductal dilatation or surrounding inflammatory changes. Spleen: Normal in size without focal abnormality. Adrenals/Urinary Tract: The adrenal glands, kidneys,  and visualized ureters are unremarkable. The urinary bladder is collapsed. Stomach/Bowel: Stomach is within normal limits. Appendix appears normal. No evidence of bowel wall thickening, distention, or inflammatory changes. Vascular/Lymphatic: No significant vascular findings are present. No enlarged abdominal or pelvic lymph nodes. Reproductive: The uterus is anteverted and grossly unremarkable. There is a 3.8 x 3.1 cm high attenuating ovoid lesion in the left hemipelvis (series 2, image 69) likely a hemorrhagic cyst or corpus luteum. Other etiologies are not excluded. Further evaluation with pelvic ultrasound is recommended. The patient has a negative pregnancy test and therefore an ectopic pregnancy is not a concern. Other: None Musculoskeletal: No acute or significant osseous findings. IMPRESSION: 1. Small complex fluid within the pelvis, likely related to rupture of a left ovarian hemorrhagic cyst/corpus luteum. Further evaluation with pelvic ultrasound recommended. 2. No bowel obstruction or active inflammation.  Normal appendix. Electronically Signed   By: Anner Crete M.D.   On: 03/26/2017 21:52    Procedures Procedures (including critical  care time)  Medications Ordered in ED Medications  ketorolac (TORADOL) 30 MG/ML injection 30 mg (30 mg Intravenous Given 03/26/17 1832)  ondansetron (ZOFRAN) injection 4 mg (4 mg Intravenous Given 03/26/17 1832)  sodium chloride 0.9 % bolus 500 mL (0 mLs Intravenous Stopped 03/26/17 2024)  sodium chloride 0.9 % bolus 1,000 mL (0 mLs Intravenous Stopped 03/26/17 2157)  iopamidol (ISOVUE-300) 61 % injection (100 mLs Intravenous Contrast Given 03/26/17 2109)     Initial Impression / Assessment and Plan / ED Course  I have reviewed the triage vital signs and the nursing notes.  Pertinent labs & imaging results that were available during my care of the patient were reviewed by me and considered in my medical decision making (see chart for details).      Patient has a ruptured ovarian cyst. She'll be sent home with Motrin and referred to OB/GYN  Final Clinical Impressions(s) / ED Diagnoses   Final diagnoses:  Cyst of ovary, unspecified laterality    New Prescriptions New Prescriptions   IBUPROFEN (ADVIL,MOTRIN) 800 MG TABLET    Take 1 tablet (800 mg total) by mouth every 8 (eight) hours as needed for moderate pain.     Milton Ferguson, MD 03/26/17 2217

## 2017-03-26 NOTE — ED Notes (Signed)
Patient transported to CT 

## 2017-03-26 NOTE — ED Triage Notes (Signed)
Pt c/o rectal and pelvic pain. Pt states she had an episode of diarrhea today. Recently dx with IBS and patient states she has had mucous in stools. Pt denies vomiting / nausea. Pt states she has pelvic burning with urination.

## 2017-03-26 NOTE — Discharge Instructions (Signed)
Follow up with the [facility] in 1-2 weeks

## 2017-03-28 ENCOUNTER — Encounter (HOSPITAL_COMMUNITY): Payer: Self-pay | Admitting: *Deleted

## 2017-03-28 ENCOUNTER — Inpatient Hospital Stay (HOSPITAL_COMMUNITY)
Admission: AD | Admit: 2017-03-28 | Discharge: 2017-03-28 | Disposition: A | Discharge status: Home / Self Care | Payer: Managed Care, Other (non HMO) | Source: Ambulatory Visit | Attending: Family Medicine | Admitting: Family Medicine

## 2017-03-28 DIAGNOSIS — I10 Essential (primary) hypertension: Secondary | ICD-10-CM | POA: Insufficient documentation

## 2017-03-28 DIAGNOSIS — K589 Irritable bowel syndrome without diarrhea: Secondary | ICD-10-CM | POA: Insufficient documentation

## 2017-03-28 DIAGNOSIS — R109 Unspecified abdominal pain: Secondary | ICD-10-CM | POA: Diagnosis present

## 2017-03-28 DIAGNOSIS — Z885 Allergy status to narcotic agent status: Secondary | ICD-10-CM | POA: Diagnosis not present

## 2017-03-28 DIAGNOSIS — N730 Acute parametritis and pelvic cellulitis: Secondary | ICD-10-CM | POA: Diagnosis not present

## 2017-03-28 DIAGNOSIS — F17210 Nicotine dependence, cigarettes, uncomplicated: Secondary | ICD-10-CM | POA: Insufficient documentation

## 2017-03-28 DIAGNOSIS — N739 Female pelvic inflammatory disease, unspecified: Secondary | ICD-10-CM | POA: Insufficient documentation

## 2017-03-28 LAB — URINALYSIS, ROUTINE W REFLEX MICROSCOPIC
BILIRUBIN URINE: NEGATIVE
Glucose, UA: NEGATIVE mg/dL
HGB URINE DIPSTICK: NEGATIVE
Ketones, ur: NEGATIVE mg/dL
LEUKOCYTES UA: NEGATIVE
NITRITE: NEGATIVE
PH: 5 (ref 5.0–8.0)
Protein, ur: NEGATIVE mg/dL
SPECIFIC GRAVITY, URINE: 1.025 (ref 1.005–1.030)

## 2017-03-28 LAB — WET PREP, GENITAL
Clue Cells Wet Prep HPF POC: NONE SEEN
SPERM: NONE SEEN
Trich, Wet Prep: NONE SEEN
WBC WET PREP: NONE SEEN

## 2017-03-28 MED ORDER — DOXYCYCLINE HYCLATE 100 MG PO CAPS: 100.0000 mg | ORAL_CAPSULE | Freq: Two times a day (BID) | ORAL | 0 refills | Status: DC

## 2017-03-28 MED ORDER — TRAMADOL HCL 50 MG PO TABS: 50.0000 mg | ORAL_TABLET | Freq: Four times a day (QID) | ORAL | 0 refills | Status: DC | PRN

## 2017-03-28 MED ORDER — METRONIDAZOLE 500 MG PO TABS: 500.0000 mg | ORAL_TABLET | Freq: Two times a day (BID) | ORAL | 0 refills | Status: DC

## 2017-03-28 MED ORDER — KETOROLAC TROMETHAMINE 30 MG/ML IJ SOLN
30.0000 mg | Freq: Once | INTRAMUSCULAR | Status: AC
  Administered 2017-03-28: 30 mg via INTRAMUSCULAR
  Filled 2017-03-28: qty 1

## 2017-03-28 MED ORDER — CEFTRIAXONE SODIUM 250 MG IJ SOLR
250.0000 mg | Freq: Once | INTRAMUSCULAR | Status: AC
  Administered 2017-03-28: 250 mg via INTRAMUSCULAR
  Filled 2017-03-28: qty 250

## 2017-03-28 NOTE — Progress Notes (Signed)
Pt wants to use the number [phone] for results/future reference.

## 2017-03-28 NOTE — Discharge Instructions (Signed)
Pelvic Inflammatory Disease Pelvic inflammatory disease (PID) refers to an infection in some or all of the female organs. The infection can be in the uterus, ovaries, fallopian tubes, or the surrounding tissues in the pelvis. PID can cause abdominal or pelvic pain that comes on suddenly (acute pelvic pain). PID is a serious infection because it can lead to lasting (chronic) pelvic pain or the inability to have children (infertility). What are the causes? This condition is most often caused by an infection that is spread during sexual contact. However, the infection can also be caused by the normal bacteria that are found in the vaginal tissues if these bacteria travel upward into the reproductive organs. PID can also occur following:  The birth of a baby.  A miscarriage.  An abortion.  Major pelvic surgery.  The use of an intrauterine device (IUD).  A sexual assault.  What increases the risk? This condition is more likely to develop in women who:  Are younger than 26 years of age.  Are sexually active at [facility] age.  Use nonbarrier contraception.  Have multiple sexual partners.  Have sex with someone who has symptoms of an STD (sexually transmitted disease).  Use oral contraception.  At times, certain behaviors can also increase the possibility of getting PID, such as:  Using a vaginal douche.  Having an IUD in place.  What are the signs or symptoms? Symptoms of this condition include:  Abdominal or pelvic pain.  Fever.  Chills.  Abnormal vaginal discharge.  Abnormal uterine bleeding.  Unusual pain shortly after the end of a menstrual period.  Painful urination.  Pain with sexual intercourse.  Nausea and vomiting.  How is this diagnosed? To diagnose this condition, your health care provider will do a physical exam and take your medical history. A pelvic exam typically reveals great tenderness in the uterus and the surrounding pelvic tissues. You may also  have tests, such as:  Lab tests, including a pregnancy test, blood tests, and urine test.  Culture tests of the vagina and cervix to check for an STD.  Ultrasound.  A laparoscopic procedure to look inside the pelvis.  Examining vaginal secretions under a microscope.  How is this treated? Treatment for this condition may involve one or more approaches.  Antibiotic medicines may be prescribed to be taken by mouth.  Sexual partners may need to be treated if the infection is caused by an STD.  For more severe cases, hospitalization may be needed to give antibiotics directly into a vein through an IV tube.  Surgery may be needed if other treatments do not help, but this is rare.  It may take weeks until you are completely well. If you are diagnosed with PID, you should also be checked for human immunodeficiency virus (HIV). Your health care provider may test you for infection again 3 months after treatment. You should not have unprotected sex. Follow these instructions at home:  Take over-the-counter and prescription medicines only as told by your health care provider.  If you were prescribed an antibiotic medicine, take it as told by your health care provider. Do not stop taking the antibiotic even if you start to feel better.  Do not have sexual intercourse until treatment is completed or as told by your health care provider. If PID is confirmed, your recent sexual partners will need treatment, especially if you had unprotected sex.  Keep all follow-up visits as told by your health care provider. This is important. Contact a health care  provider if: °· You have increased or abnormal vaginal discharge. °· Your pain does not improve. °· You vomit. °· You have a fever. °· You cannot tolerate your medicines. °· Your partner has an STD. °· You have pain when you urinate. °Get help right away if: °· You have increased abdominal or pelvic pain. °· You have chills. °· Your symptoms are not  better in 72 hours even with treatment. °This information is not intended to replace advice given to you by your health care provider. Make sure you discuss any questions you have with your health care provider. °Document Released: 08/15/2005 Document Revised: 01/21/2016 Document Reviewed: 09/22/2014 °Elsevier Interactive Patient Education © 2018 [employer] ° °

## 2017-03-28 NOTE — MAU Note (Addendum)
Pt was at the emergency room two days ago and states she has a ruptured cyst says it felt better yesterday but now it feels worse.  Pain on the top and bottom of abdomen. It hurts to drive and walk.  States her pain is a 7/10.  Feels like her abdomen is distended and firm.

## 2017-03-28 NOTE — MAU Note (Signed)
Urine in lab 

## 2017-03-28 NOTE — MAU Provider Note (Signed)
History     CSN: [id number]  Arrival date and time: 03/28/17 1539   First Provider Initiated Contact with Patient 03/28/17 1625      Chief Complaint  Patient presents with  . Abdominal Pain   Nonpregnant female here with abdominal pain. Pain started 3 days ago. At that time it was mostly in the upper right abdomen and right shoulder. One day later the pain moved to the lower abdomen and rectum. Describes as sharp and intermittent. Was seen at ED and CT of abdomen/pelvis showed 3 cm left hemorrhagic cyst or CLC. Pain improved yesterday then became worse today. She used Ibuprofen and had not relief. Denies vaginal discharge. No new partner in 2 years (same sex), however she reports they broke up once and the partner was going to have sex with another person but the pt is unsure if this occurred. No STD hx.    Past Medical History:  Diagnosis Date  . Hypertension   . Irritable bowel syndrome     Past Surgical History:  Procedure Laterality Date  . CESAREAN SECTION    . CHOLECYSTECTOMY    . HERNIA REPAIR    . TONSILLECTOMY      Family History  Problem Relation Age of Onset  . Hypertension Other   . Diabetes Other     Social History  Substance Use Topics  . Smoking status: Current Every Day Smoker    Packs/day: 0.50    Types: Cigarettes  . Smokeless tobacco: Never Used  . Alcohol use Yes    Allergies:  Allergies  Allergen Reactions  . Percocet [Oxycodone-Acetaminophen] Hives and Itching    Prescriptions Prior to Admission  Medication Sig Dispense Refill Last Dose  . albuterol (PROVENTIL HFA;VENTOLIN HFA) 108 (90 Base) MCG/ACT inhaler Inhale 1-2 puffs into the lungs every 6 (six) hours as needed for wheezing or shortness of breath.   Past Month at Unknown time  . Cholecalciferol (VITAMIN D PO) Take 2 capsules by mouth daily.   03/27/2017 at Unknown time  . ibuprofen (ADVIL,MOTRIN) 800 MG tablet Take 1 tablet (800 mg total) by mouth every 8 (eight) hours as needed for  moderate pain. 21 tablet 0 03/28/2017 at Unknown time  . ranitidine (ZANTAC) 150 MG tablet Take 150 mg by mouth 2 (two) times daily as needed for heartburn.   Past Week at Unknown time  . HYDROcodone-acetaminophen (NORCO/VICODIN) 5-325 MG tablet Take 1 tablet by mouth every 4 (four) hours as needed. (Patient not taking: Reported on 03/26/2017) 2 tablet 0 Not Taking at Unknown time    Review of Systems  Constitutional: Negative for fever.  Gastrointestinal: Positive for abdominal pain and nausea. Negative for constipation, diarrhea and vomiting.  Genitourinary: Negative for dysuria, hematuria, urgency and vaginal discharge.   Physical Exam   Blood pressure (!) 146/78, pulse 98, temperature 97.9 F (36.6 C), temperature source Oral, resp. rate 17, last menstrual period 03/10/2017, SpO2 100 %.  Physical Exam  Nursing note and vitals reviewed. Constitutional: She is oriented to person, place, and time. She appears well-developed and well-nourished. No distress.  HENT:  Head: Normocephalic and atraumatic.  Neck: Normal range of motion.  Respiratory: Effort normal. No respiratory distress.  GI: Soft. She exhibits no distension and no mass. There is generalized tenderness. There is no rigidity, no rebound and no guarding.  Genitourinary:  Genitourinary Comments: External: no lesions or erythema Vagina: rugated, pink, moist, scant thin white discharge Uterus: non enlarged, anteverted, + tender, ++ CMT Adnexae: no masses, +  tenderness left, + tenderness right   Musculoskeletal: Normal range of motion.  Neurological: She is alert and oriented to person, place, and time.  Skin: Skin is warm and dry.  Psychiatric: She has a normal mood and affect.   Results for orders placed or performed during the hospital encounter of 03/28/17 (from the past 24 hour(s))  Urinalysis, Routine w reflex microscopic     Status: Abnormal   Collection Time: 03/28/17  3:40 PM  Result Value Ref Range   Color, Urine  YELLOW YELLOW   APPearance HAZY (A) CLEAR   Specific Gravity, Urine 1.025 1.005 - 1.030   pH 5.0 5.0 - 8.0   Glucose, UA NEGATIVE NEGATIVE mg/dL   Hgb urine dipstick NEGATIVE NEGATIVE   Bilirubin Urine NEGATIVE NEGATIVE   Ketones, ur NEGATIVE NEGATIVE mg/dL   Protein, ur NEGATIVE NEGATIVE mg/dL   Nitrite NEGATIVE NEGATIVE   Leukocytes, UA NEGATIVE NEGATIVE   RBC / HPF 0-5 0 - 5 RBC/hpf   WBC, UA 0-5 0 - 5 WBC/hpf   Bacteria, UA RARE (A) NONE SEEN   Squamous Epithelial / LPF 0-5 (A) NONE SEEN   Mucous PRESENT   Wet prep, genital     Status: Abnormal   Collection Time: 03/28/17  4:40 PM  Result Value Ref Range   Yeast Wet Prep HPF POC PRESENT (A) NONE SEEN   Trich, Wet Prep NONE SEEN NONE SEEN   Clue Cells Wet Prep HPF POC NONE SEEN NONE SEEN   WBC, Wet Prep HPF POC NONE SEEN NONE SEEN   Sperm NONE SEEN     MAU Course  Procedures Toradol- declined narcotic (doesn't have ride) Rocephin  MDM Labs ordered and reviewed. Modest improvement of pain. No evidence of acute abdominal process. Will treat PID. Cultures pending. Stable for discharge home.  Assessment and Plan   1. PID (acute pelvic inflammatory disease)    Discharge home Rx Flagyl Rx Doxy Rx Ultram Follow with [facility] as previously scheduled  Allergies as of 03/28/2017      Reactions   Percocet [oxycodone-acetaminophen] Hives, Itching      Medication List    STOP taking these medications   HYDROcodone-acetaminophen 5-325 MG tablet Commonly known as:  NORCO/VICODIN     TAKE these medications   albuterol 108 (90 Base) MCG/ACT inhaler Commonly known as:  PROVENTIL HFA;VENTOLIN HFA Inhale 1-2 puffs into the lungs every 6 (six) hours as needed for wheezing or shortness of breath.   doxycycline 100 MG capsule Commonly known as:  VIBRAMYCIN Take 1 capsule (100 mg total) by mouth 2 (two) times daily.   ibuprofen 800 MG tablet Commonly known as:  ADVIL,MOTRIN Take 1 tablet (800 mg total) by mouth every 8 (eight)  hours as needed for moderate pain.   metroNIDAZOLE 500 MG tablet Commonly known as:  FLAGYL Take 1 tablet (500 mg total) by mouth 2 (two) times daily.   ranitidine 150 MG tablet Commonly known as:  ZANTAC Take 150 mg by mouth 2 (two) times daily as needed for heartburn.   traMADol 50 MG tablet Commonly known as:  ULTRAM Take 1 tablet (50 mg total) by mouth every 6 (six) hours as needed.   VITAMIN D PO Take 2 capsules by mouth daily.      Julianne Handler, CNM 03/28/2017, 4:33 PM

## 2017-03-31 LAB — GC/CHLAMYDIA PROBE AMP (~~LOC~~) NOT AT ARMC
Chlamydia: NEGATIVE
Neisseria Gonorrhea: NEGATIVE

## 2017-04-07 ENCOUNTER — Encounter: Payer: Managed Care, Other (non HMO) | Admitting: Family Medicine

## 2017-04-12 ENCOUNTER — Emergency Department (HOSPITAL_COMMUNITY): Payer: Managed Care, Other (non HMO)

## 2017-04-12 ENCOUNTER — Encounter (HOSPITAL_COMMUNITY): Payer: Self-pay

## 2017-04-12 ENCOUNTER — Emergency Department (HOSPITAL_COMMUNITY)
Admission: EM | Admit: 2017-04-12 | Discharge: 2017-04-12 | Disposition: A | Discharge status: Home / Self Care | Payer: Managed Care, Other (non HMO) | Attending: Emergency Medicine | Admitting: Emergency Medicine

## 2017-04-12 DIAGNOSIS — I10 Essential (primary) hypertension: Secondary | ICD-10-CM | POA: Diagnosis not present

## 2017-04-12 DIAGNOSIS — R079 Chest pain, unspecified: Secondary | ICD-10-CM | POA: Diagnosis not present

## 2017-04-12 DIAGNOSIS — F17210 Nicotine dependence, cigarettes, uncomplicated: Secondary | ICD-10-CM | POA: Insufficient documentation

## 2017-04-12 DIAGNOSIS — R002 Palpitations: Secondary | ICD-10-CM

## 2017-04-12 LAB — CBC WITH DIFFERENTIAL/PLATELET
BASOS PCT: 0 %
Basophils Absolute: 0 10*3/uL (ref 0.0–0.1)
Eosinophils Absolute: 0.5 10*3/uL (ref 0.0–0.7)
Eosinophils Relative: 4 %
HCT: 32.5 % — ABNORMAL LOW (ref 36.0–46.0)
Hemoglobin: 10.4 g/dL — ABNORMAL LOW (ref 12.0–15.0)
LYMPHS ABS: 3.1 10*3/uL (ref 0.7–4.0)
Lymphocytes Relative: 27 %
MCH: 19.4 pg — AB (ref 26.0–34.0)
MCHC: 32 g/dL (ref 30.0–36.0)
MCV: 60.6 fL — ABNORMAL LOW (ref 78.0–100.0)
MONO ABS: 0.6 10*3/uL (ref 0.1–1.0)
Monocytes Relative: 5 %
NEUTROS ABS: 7.2 10*3/uL (ref 1.7–7.7)
Neutrophils Relative %: 64 %
PLATELETS: 196 10*3/uL (ref 150–400)
RBC: 5.36 MIL/uL — ABNORMAL HIGH (ref 3.87–5.11)
RDW: 15.5 % (ref 11.5–15.5)
WBC: 11.4 10*3/uL — ABNORMAL HIGH (ref 4.0–10.5)

## 2017-04-12 LAB — BASIC METABOLIC PANEL
Anion gap: 8 (ref 5–15)
BUN: 8 mg/dL (ref 6–20)
CO2: 24 mmol/L (ref 22–32)
CREATININE: 0.69 mg/dL (ref 0.44–1.00)
Calcium: 8.8 mg/dL — ABNORMAL LOW (ref 8.9–10.3)
Chloride: 109 mmol/L (ref 101–111)
GFR calc Af Amer: >60 mL/min (ref >60)
Glucose, Bld: 112 mg/dL — ABNORMAL HIGH (ref 65–99)
Potassium: 3.6 mmol/L (ref 3.5–5.1)
Sodium: 141 mmol/L (ref 135–145)

## 2017-04-12 LAB — I-STAT TROPONIN, ED: TROPONIN I, POC: 0 ng/mL (ref 0.00–0.08)

## 2017-04-12 LAB — I-STAT BETA HCG BLOOD, ED (MC, WL, AP ONLY): I-stat hCG, quantitative: <5 m[IU]/mL (ref <5)

## 2017-04-12 LAB — D-DIMER, QUANTITATIVE: D-Dimer, Quant: 1.08 ug/mL-FEU — ABNORMAL HIGH (ref 0.00–0.50)

## 2017-04-12 MED ORDER — IOPAMIDOL (ISOVUE-370) INJECTION 76%
INTRAVENOUS | Status: AC
  Administered 2017-04-12: 75 mL via INTRAVENOUS
  Filled 2017-04-12: qty 100

## 2017-04-12 NOTE — ED Notes (Signed)
Patient transported to CT 

## 2017-04-12 NOTE — ED Triage Notes (Signed)
[facility]- pt coming from home c/o chest pressure since Monday. Pt had PICC line placed last Thursday for colonoscopy and began having palpitations. Pt reports CP started Monday and has gradually gotten worse. PICC line still in place.

## 2017-04-12 NOTE — Discharge Instructions (Addendum)
Your work-up today was negative. I recommend that you follow-up with your primary care doctor or cardiology.  Follow up for central line removal.  Return here for any new/worsening symptoms.

## 2017-04-12 NOTE — ED Provider Notes (Signed)
5:19 PM Patient signed out to me at shift change. Patient emergency department with chest pain, workup was initiated by PA Quincy Carnes. Patient had elevated d-dimer, CT angiogram was pending at time of sign out.  Patient CT angios negative. Discussed other possibilities for chest pain including musculoskeletal, pericarditis, costochondritis. She is low risk for ACS, EKG and negative troponin here in emergency department. In with a PICC line in the right arm, requesting it to be taken out. A To her that she will need to follow-up with her doctor to get that out. She was a guest under impression that we were going to pull it out here. I explained to her that we do not do that, and that I am not even sure if they will need to access it anymore. We will change the dressing which could appears to be old and dirty. I will have her call her doctor tomorrow. Patient agrees to the plan.  Vitals:   04/12/17 1545 04/12/17 1600 04/12/17 1615 04/12/17 1630  BP: 106/70 97/65 (!) 95/56 (!) 94/50  Pulse: 80 78 83 80  Resp: 19 13 (!) 21 20  Temp:      TempSrc:      SpO2: 99% 100% 100% 98%      Jeannett Senior, PA-C 04/12/17 1724    Lacretia Leigh, MD 04/13/17 2203

## 2017-04-12 NOTE — ED Provider Notes (Signed)
[facility] DEPT Provider Note   CSN: [id number] Arrival date & time: 04/12/17  1234     History   Chief Complaint Chief Complaint  Patient presents with  . Chest Pain    HPI Julie King is a 26 y.o. female.  The history is provided by the patient and medical records.  Chest Pain      26 year old female with history of hypertension and IBS, presenting to the ED with chest pain and palpitations. Patient reports last Thursday (6 days ago) she had a PICC line placed as she was due for colonoscopy.  Reports she gets too dehydrated from the prep so PICC line is needed for continued IVF during this period.  States immediately after the PICC line was placed she began having some palpitations in the parking lot. States she called the doctor on call and was told to go to the ED, but she decided to go home. States over the weekend she developed some chest pain. States is localized to the center of her chest with some radiation to the back. Reports increased pain with deep breathing, movement, coughing. No pain into the left arm or jaw. States pain is somewhat constant, associated with intermittent shortness of breath. No diaphoresis, nausea, or vomiting. Patient reports some cardiac "issues" in the past. States she has seen a cardiologist several years ago, has been trying to follow-up but has had to cancel her appointments. She does have family history on her maternal grandmother side. Patient is a daily smoker.  Past Medical History:  Diagnosis Date  . Hypertension   . Irritable bowel syndrome     There are no active problems to display for this patient.   Past Surgical History:  Procedure Laterality Date  . CESAREAN SECTION    . CHOLECYSTECTOMY    . HERNIA REPAIR    . TONSILLECTOMY      OB History    Gravida Para Term Preterm AB Living   1 1 1     1    SAB TAB Ectopic Multiple Live Births           1       Home Medications    Prior to Admission medications     Medication Sig Start Date End Date Taking? Authorizing Provider  albuterol (PROVENTIL HFA;VENTOLIN HFA) 108 (90 Base) MCG/ACT inhaler Inhale 1-2 puffs into the lungs every 6 (six) hours as needed for wheezing or shortness of breath.    [provider]  Cholecalciferol (VITAMIN D PO) Take 2 capsules by mouth daily.    [provider]  doxycycline (VIBRAMYCIN) 100 MG capsule Take 1 capsule (100 mg total) by mouth 2 (two) times daily. 03/28/17   Julianne Handler, CNM  ibuprofen (ADVIL,MOTRIN) 800 MG tablet Take 1 tablet (800 mg total) by mouth every 8 (eight) hours as needed for moderate pain. 03/26/17   Milton Ferguson, MD  metroNIDAZOLE (FLAGYL) 500 MG tablet Take 1 tablet (500 mg total) by mouth 2 (two) times daily. 03/28/17   Julianne Handler, CNM  ranitidine (ZANTAC) 150 MG tablet Take 150 mg by mouth 2 (two) times daily as needed for heartburn.    [provider]  traMADol (ULTRAM) 50 MG tablet Take 1 tablet (50 mg total) by mouth every 6 (six) hours as needed. 03/28/17 03/28/18  Julianne Handler, CNM    Family History Family History  Problem Relation Age of Onset  . Hypertension Other   . Diabetes Other     Social History Social History  Substance Use Topics  . Smoking status: Current Every Day Smoker    Packs/day: 0.50    Types: Cigarettes  . Smokeless tobacco: Never Used  . Alcohol use Yes     Allergies   Percocet [oxycodone-acetaminophen]   Review of Systems Review of Systems  Cardiovascular: Positive for chest pain.     Physical Exam Updated Vital Signs BP 115/62 (BP Location: Left Arm)   Pulse 98   Temp 98.6 F (37 C) (Oral)   Resp 16   LMP 04/09/2017 (Within Days)   SpO2 100%   Physical Exam  Constitutional: She is oriented to person, place, and time. She appears well-developed and well-nourished.  HENT:  Head: Normocephalic and atraumatic.  Mouth/Throat: Oropharynx is clear and moist.  Eyes: Pupils are equal, round, and reactive  to light. Conjunctivae and EOM are normal.  Neck: Normal range of motion.  Cardiovascular: Normal rate, regular rhythm and normal heart sounds.   Pulmonary/Chest: Effort normal and breath sounds normal.  No acute distress, speaking in full sentences, chest wall non-tender  Abdominal: Soft. Bowel sounds are normal.  Musculoskeletal: Normal range of motion.  Right upper arm with PICC line in place; bandage and dressing appears dirty (does not appear to have been changed since placement); no drainage or erythema noted, no warmth to touch  Neurological: She is alert and oriented to person, place, and time.  Skin: Skin is warm and dry.  Psychiatric: She has a normal mood and affect.  Nursing note and vitals reviewed.    ED Treatments / Results  Labs (all labs ordered are listed, but only abnormal results are displayed) Labs Reviewed  CBC WITH DIFFERENTIAL/PLATELET - Abnormal; Notable for the following:       Result Value   WBC 11.4 (*)    RBC 5.36 (*)    Hemoglobin 10.4 (*)    HCT 32.5 (*)    MCV 60.6 (*)    MCH 19.4 (*)    All other components within normal limits  BASIC METABOLIC PANEL - Abnormal; Notable for the following:    Glucose, Bld 112 (*)    Calcium 8.8 (*)    All other components within normal limits  D-DIMER, QUANTITATIVE (NOT AT [facility]) - Abnormal; Notable for the following:    D-Dimer, Quant 1.08 (*)    All other components within normal limits  I-STAT TROPONIN, ED  I-STAT BETA HCG BLOOD, ED ([facility], [facility], [facility] ONLY)    EKG  EKG Interpretation  Date/Time:  Wednesday April 12 2017 12:56:30 EDT Ventricular Rate:  95 PR Interval:    QRS Duration: 100 QT Interval:  340 QTC Calculation: 428 R Axis:   54 Text Interpretation:  Sinus rhythm Nonspecific T abnormalities, inferior leads similar to previous tracing from 5/18 Confirmed by Virgel Manifold [phone]) on 04/12/2017 1:35:20 PM       Radiology Dg Chest 2 View  Result Date: 04/12/2017 CLINICAL DATA:  26 y/o  F;  chest pain and shortness of breath. EXAM: CHEST  2 VIEW COMPARISON:  12/30/2016 chest radiograph FINDINGS: Stable heart size and mediastinal contours are within normal limits. Both lungs are clear. The visualized skeletal structures are unremarkable. Right PICC line catheter tip projects over lower SVC. IMPRESSION: No active cardiopulmonary disease. Electronically Signed   By: Kristine Garbe M.D.   On: 04/12/2017 14:54    Procedures Procedures (including critical care time)  Medications Ordered in ED Medications - No data to display   Initial Impression / Assessment and Plan /  ED Course  I have reviewed the triage vital signs and the nursing notes.  Pertinent labs & imaging results that were available during my care of the patient were reviewed by me and considered in my medical decision making (see chart for details).  26 year old female here with chest pain and palpitations. Recent PICC line placed for IV hydration during colonoscopy prep. Reports chest pain developed a few days after. She is afebrile and nontoxic. PICC line in right upper extremity with dirty bandage that does not appear to have been changed, however no drainage or erythema of the site.  Heart rate is within normal limits. Normal sinus rhythm. EKG nonischemic, some T-wave changes that are very similar to prior EKG.  Lab work overall reassuring, troponin negative.  D-dimer is elevated at 1.08. Chest x-ray is clear.  Will plan for CTA of chest to evaluate for PE.  4:02 PM CTA pending.  Care signed out to oncoming provider.  If scan is negative, feel patient can be discharged home to follow-up with PCP.  Final Clinical Impressions(s) / ED Diagnoses   Final diagnoses:  Chest pain, unspecified type  Palpitations    New Prescriptions New Prescriptions   No medications on file     Kathryne Hitch 04/12/17 1602    Virgel Manifold, MD 04/13/17 [phone]

## 2017-04-20 ENCOUNTER — Encounter (HOSPITAL_COMMUNITY): Payer: Self-pay | Admitting: Nurse Practitioner

## 2017-04-20 ENCOUNTER — Emergency Department (HOSPITAL_COMMUNITY)
Admission: EM | Admit: 2017-04-20 | Discharge: 2017-04-21 | Disposition: A | Discharge status: Home / Self Care | Payer: Managed Care, Other (non HMO) | Attending: Emergency Medicine | Admitting: Emergency Medicine

## 2017-04-20 ENCOUNTER — Emergency Department (HOSPITAL_COMMUNITY): Payer: Managed Care, Other (non HMO)

## 2017-04-20 DIAGNOSIS — I10 Essential (primary) hypertension: Secondary | ICD-10-CM | POA: Diagnosis not present

## 2017-04-20 DIAGNOSIS — R079 Chest pain, unspecified: Secondary | ICD-10-CM

## 2017-04-20 DIAGNOSIS — F17210 Nicotine dependence, cigarettes, uncomplicated: Secondary | ICD-10-CM | POA: Diagnosis not present

## 2017-04-20 DIAGNOSIS — Z79899 Other long term (current) drug therapy: Secondary | ICD-10-CM | POA: Insufficient documentation

## 2017-04-20 DIAGNOSIS — I4891 Unspecified atrial fibrillation: Secondary | ICD-10-CM | POA: Insufficient documentation

## 2017-04-20 DIAGNOSIS — R0789 Other chest pain: Secondary | ICD-10-CM | POA: Diagnosis present

## 2017-04-20 DIAGNOSIS — R42 Dizziness and giddiness: Secondary | ICD-10-CM | POA: Insufficient documentation

## 2017-04-20 DIAGNOSIS — Z7982 Long term (current) use of aspirin: Secondary | ICD-10-CM | POA: Diagnosis not present

## 2017-04-20 DIAGNOSIS — R002 Palpitations: Secondary | ICD-10-CM | POA: Insufficient documentation

## 2017-04-20 HISTORY — DX: Abnormal electrocardiogram (ECG) (EKG): R94.31

## 2017-04-20 LAB — CBC
HCT: 33.8 % — ABNORMAL LOW (ref 36.0–46.0)
Hemoglobin: 10.7 g/dL — ABNORMAL LOW (ref 12.0–15.0)
MCH: 19.1 pg — ABNORMAL LOW (ref 26.0–34.0)
MCHC: 31.7 g/dL (ref 30.0–36.0)
MCV: 60.2 fL — ABNORMAL LOW (ref 78.0–100.0)
Platelets: 189 10*3/uL (ref 150–400)
RBC: 5.61 MIL/uL — ABNORMAL HIGH (ref 3.87–5.11)
RDW: 15.7 % — ABNORMAL HIGH (ref 11.5–15.5)
WBC: 12.1 10*3/uL — ABNORMAL HIGH (ref 4.0–10.5)

## 2017-04-20 LAB — BASIC METABOLIC PANEL WITH GFR
Anion gap: 9 (ref 5–15)
BUN: 8 mg/dL (ref 6–20)
CO2: 22 mmol/L (ref 22–32)
Calcium: 8.7 mg/dL — ABNORMAL LOW (ref 8.9–10.3)
Chloride: 104 mmol/L (ref 101–111)
Creatinine, Ser: 0.65 mg/dL (ref 0.44–1.00)
GFR calc Af Amer: >60 mL/min
GFR calc non Af Amer: >60 mL/min
Glucose, Bld: 87 mg/dL (ref 65–99)
Potassium: 3.9 mmol/L (ref 3.5–5.1)
Sodium: 135 mmol/L (ref 135–145)

## 2017-04-20 LAB — RAPID URINE DRUG SCREEN, HOSP PERFORMED
Amphetamines: NOT DETECTED
BARBITURATES: NOT DETECTED
BENZODIAZEPINES: NOT DETECTED
Cocaine: NOT DETECTED
Opiates: NOT DETECTED
TETRAHYDROCANNABINOL: NOT DETECTED

## 2017-04-20 LAB — I-STAT TROPONIN, ED: TROPONIN I, POC: 0 ng/mL (ref 0.00–0.08)

## 2017-04-20 LAB — I-STAT BETA HCG BLOOD, ED (MC, WL, AP ONLY)

## 2017-04-20 LAB — MAGNESIUM: MAGNESIUM: 1.8 mg/dL (ref 1.7–2.4)

## 2017-04-20 MED ORDER — METOPROLOL TARTRATE 5 MG/5ML IV SOLN: 5.0000 mg | INTRAVENOUS | Status: DC | PRN

## 2017-04-20 MED ORDER — METOPROLOL TARTRATE 25 MG PO TABS
25.0000 mg | ORAL_TABLET | Freq: Once | ORAL | Status: AC
  Administered 2017-04-20: 25 mg via ORAL
  Filled 2017-04-20: qty 1

## 2017-04-20 MED ORDER — ASPIRIN 81 MG PO CHEW
324.0000 mg | CHEWABLE_TABLET | Freq: Once | ORAL | Status: AC
Start: 2017-04-20 — End: 2017-04-20
  Administered 2017-04-20: 324 mg via ORAL
  Filled 2017-04-20: qty 4

## 2017-04-20 NOTE — ED Provider Notes (Signed)
[facility] DEPT Provider Note   CSN: [id number] Arrival date & time: 04/20/17  2030     History   Chief Complaint Chief Complaint  Patient presents with  . Shortness of Breath    HPI Julie King is a 26 y.o. female.  HPI Pt comes in with cc of palpitations. Pt has hx of IBS, Abnormal EKG (prolonged QT, short PR, TWI). She reports that 2 hours ago she started having sudden palpitations. Pt is having chest pressure and feeling dizzy. Pt reports that she has had similar symptoms in the past on multiple occasion, but they typically resolve in a minute or sometimes seconds. The current  Symptoms are constant.  Pt has no hx of PE, DVT and denies any exogenous hormone (testosterone / estrogen) use, long distance travels or surgery in the past 6 weeks, active cancer, recent immobilization. Pt denies any drug use, heavy caffeine use. Pt has no family hx of arrhythmia.   Past Medical History:  Diagnosis Date  . Hypertension   . Irritable bowel syndrome   . QT prolongation   . T wave inversion in EKG     There are no active problems to display for this patient.   Past Surgical History:  Procedure Laterality Date  . CESAREAN SECTION    . CHOLECYSTECTOMY    . HERNIA REPAIR    . TONSILLECTOMY      OB History    Gravida Para Term Preterm AB Living   1 1 1     1    SAB TAB Ectopic Multiple Live Births           1       Home Medications    Prior to Admission medications   Medication Sig Start Date End Date Taking? Authorizing Provider  aspirin EC 325 MG tablet Take 1 tablet (325 mg total) by mouth daily. 04/21/17   Varney Biles, MD  doxycycline (VIBRAMYCIN) 100 MG capsule Take 1 capsule (100 mg total) by mouth 2 (two) times daily. Patient not taking: Reported on 04/12/2017 03/28/17   Julianne Handler, CNM  ibuprofen (ADVIL,MOTRIN) 800 MG tablet Take 1 tablet (800 mg total) by mouth every 8 (eight) hours as needed for moderate pain. Patient not taking: Reported on  04/20/2017 03/26/17   Milton Ferguson, MD  metoprolol tartrate (LOPRESSOR) 25 MG tablet Take 1 tablet (25 mg total) by mouth 2 (two) times daily. 04/21/17   Varney Biles, MD  metroNIDAZOLE (FLAGYL) 500 MG tablet Take 1 tablet (500 mg total) by mouth 2 (two) times daily. Patient not taking: Reported on 04/12/2017 03/28/17   Julianne Handler, CNM  traMADol (ULTRAM) 50 MG tablet Take 1 tablet (50 mg total) by mouth every 6 (six) hours as needed. Patient not taking: Reported on 04/20/2017 03/28/17 03/28/18  Julianne Handler, CNM    Family History Family History  Problem Relation Age of Onset  . Hypertension Other   . Diabetes Other     Social History Social History  Substance Use Topics  . Smoking status: Current Every Day Smoker    Packs/day: 0.50    Types: Cigarettes  . Smokeless tobacco: Never Used  . Alcohol use Yes     Comment: rare     Allergies   Percocet [oxycodone-acetaminophen]   Review of Systems Review of Systems  Constitutional: Positive for activity change.  Cardiovascular: Positive for chest pain and palpitations.  All other systems reviewed and are negative.    Physical Exam Updated Vital Signs BP 108/80  Pulse (!) 125   Temp 98.2 F (36.8 C) (Oral)   Resp (!) 24   Ht 5\' 7"  (1.702 m)   Wt 131.5 kg (290 lb)   LMP 04/09/2017 (Within Days)   SpO2 100%   BMI 45.42 kg/m   Physical Exam  Constitutional: She is oriented to person, place, and time. She appears well-developed and well-nourished.  HENT:  Head: Normocephalic and atraumatic.  Eyes: Pupils are equal, round, and reactive to light. EOM are normal.  Neck: Neck supple.  Cardiovascular: Normal heart sounds.   No murmur heard. Tachycardia, irregular  Pulmonary/Chest: Effort normal. No respiratory distress.  Abdominal: Soft. She exhibits no distension. There is no tenderness. There is no rebound and no guarding.  Musculoskeletal: She exhibits no edema or tenderness.  Neurological: She is alert and  oriented to person, place, and time.  Skin: Skin is warm and dry.  Nursing note and vitals reviewed.    ED Treatments / Results  Labs (all labs ordered are listed, but only abnormal results are displayed) Labs Reviewed  BASIC METABOLIC PANEL - Abnormal; Notable for the following:       Result Value   Calcium 8.7 (*)    All other components within normal limits  CBC - Abnormal; Notable for the following:    WBC 12.1 (*)    RBC 5.61 (*)    Hemoglobin 10.7 (*)    HCT 33.8 (*)    MCV 60.2 (*)    MCH 19.1 (*)    RDW 15.7 (*)    All other components within normal limits  MAGNESIUM  RAPID URINE DRUG SCREEN, HOSP PERFORMED  MAGNESIUM  TSH  I-STAT TROPONIN, ED  I-STAT BETA HCG BLOOD, ED ([facility], [facility], [facility] ONLY)    EKG  EKG Interpretation  Date/Time:  Thursday April 20 2017 20:44:05 EDT Ventricular Rate:  104 PR Interval:    QRS Duration: 88 QT Interval:  340 QTC Calculation: 447 R Axis:   73 Text Interpretation:  Atrial fibrillation with rapid ventricular response Nonspecific T wave abnormality Abnormal ECG afib is new Confirmed by Varney Biles [phone]) on 04/20/2017 8:47:55 PM       Radiology Dg Chest 2 View  Result Date: 04/20/2017 CLINICAL DATA:  27 y/o  F; 2 hours of shortness of breath. EXAM: CHEST  2 VIEW COMPARISON:  04/12/2017 chest radiograph FINDINGS: Stable heart size and mediastinal contours are within normal limits. Right PICC line tip projects over lower SVC. Both lungs are clear. The visualized skeletal structures are unremarkable. IMPRESSION: No active cardiopulmonary disease. Electronically Signed   By: Kristine Garbe M.D.   On: 04/20/2017 21:35    Procedures Procedures (including critical care time)  Medications Ordered in ED Medications  metoprolol tartrate (LOPRESSOR) tablet 25 mg (25 mg Oral Given 04/20/17 2245)  aspirin chewable tablet 324 mg (324 mg Oral Given 04/20/17 2245)     Initial Impression / Assessment and Plan / ED Course  I  have reviewed the triage vital signs and the nursing notes.  Pertinent labs & imaging results that were available during my care of the patient were reviewed by me and considered in my medical decision making (see chart for details).  Clinical Course as of Apr 21 34  Fri Apr 21, 2017  0031 Pt's HR is still fluctuating. At rest her HR is in the 80s and 90s, but when she walks, her heart rate jumps up to 120s - and that's when she is symptomatic. We gave oral metoprolol  already. We discussed with the patient that from medical perspective, we would like her to be admitted for symptomatic atrial fibrillation. Pt however has 3 kids, and doesn't think she can stay. She asked me the if it is safe for her to go home. I informed her that overall AF in a young and otherwise healthy patient doesn't pose any imminent danger, however, in her case the heart rate can still jump up and cause her to get dizzy or faint, or with new meds her BP can drop and she can faint - so there are some potential risks by going home.  Pt took 15 minutes to discuss her options with her significant other and she decided to go home.  I will start her on metoprolol 25 mg bid. Strict ER return precautions have been discussed, and patient is agreeing with the plan and is comfortable with the workup done and the recommendations from the ER.  Pt advised not to drive or engage in exertional activities.  [AN]    Clinical Course User Index [AN] Varney Biles, MD    Pt has symptomatic AF. She seems to have been having intermittent episodes for a long while now, but they are intermittent and short acting. I spoke with Cardiology, they dont think pt is a good candidate of EM cardioversion given the intermittent episodes.  No PE risk factors and recent neg CT PE. She has had some QTc prolongation and short PR interval hx - but doesn't seem that is directly related.. Labs ordered. Metoprolol ivp 5 mg ordered.   CHA2DS2/VAS Stroke  Risk Points: 0    Final Clinical Impressions(s) / ED Diagnoses   Final diagnoses:  New onset a-fib ([place])  Chest pain, unspecified type    New Prescriptions New Prescriptions   ASPIRIN EC 325 MG TABLET    Take 1 tablet (325 mg total) by mouth daily.   METOPROLOL TARTRATE (LOPRESSOR) 25 MG TABLET    Take 1 tablet (25 mg total) by mouth 2 (two) times daily.     Varney Biles, MD 04/21/17 [phone]

## 2017-04-20 NOTE — ED Notes (Signed)
IV team came and assessed PICC line states okay to use

## 2017-04-20 NOTE — ED Triage Notes (Signed)
Per EMS pt was sitting in car and had an episode of shortness of breath and palpitations- notes she has had this episode before but this was the longest ever lasting approximately 45 min so she called EMS. Pt noted to be in a.fib rate 90-160bpm- no history of such. Pt has appointment for holter monitor tomorrow. Pt denies pain, nausea.

## 2017-04-20 NOTE — ED Notes (Signed)
Pt to xray

## 2017-04-21 ENCOUNTER — Telehealth (HOSPITAL_COMMUNITY): Payer: Self-pay | Admitting: *Deleted

## 2017-04-21 MED ORDER — METOPROLOL TARTRATE 25 MG PO TABS: 25.0000 mg | ORAL_TABLET | Freq: Two times a day (BID) | ORAL | 0 refills | Status: DC

## 2017-04-21 MED ORDER — ASPIRIN EC 325 MG PO TBEC
325.0000 mg | DELAYED_RELEASE_TABLET | Freq: Every day | ORAL | 0 refills | Status: DC
Start: 2017-04-21 — End: 2018-05-21

## 2017-04-21 NOTE — Discharge Instructions (Signed)
Please see the afib team next week.  Start taking the medicine prescribed.  If you start having chest pain, dizziness, fainting, shortness of breath - return to the ER.

## 2017-04-21 NOTE — ED Notes (Addendum)
Pt verbalizes understanding of d/c instructions. Pt being taken home by family member. Encouraged pt to follow up and take prescribed medications. Pt A&O and ambulatory at discharge.

## 2017-04-21 NOTE — Telephone Encounter (Signed)
Attempted to contact pt x 2.  Phone line rings until silent, no ans. No machine.  Tried to contact pt mother since listed on contacts.  LM on vcml on mothers line

## 2017-04-21 NOTE — Telephone Encounter (Signed)
Pt cld back and was given an appt for Tuesday at 9:00 am. Pt asked if she should return to the ER over the weekend should her heart rate race like last night.  She had rates in the 170s per pt but recorded in ER at 125.  Pt was advised should she start to "race" become dizzy or feel tightness in her chest then she should return to ER over weekend.  Pt understood and will continue medications.  She was given directions to afib clinic.  Pt understood

## 2017-04-21 NOTE — ED Notes (Signed)
Pt ambulatory to restroom

## 2017-04-23 ENCOUNTER — Emergency Department (HOSPITAL_COMMUNITY)
Admission: EM | Admit: 2017-04-23 | Discharge: 2017-04-23 | Disposition: A | Discharge status: Home / Self Care | Payer: Managed Care, Other (non HMO) | Attending: Emergency Medicine | Admitting: Emergency Medicine

## 2017-04-23 ENCOUNTER — Telehealth: Payer: Self-pay | Admitting: Physician Assistant

## 2017-04-23 ENCOUNTER — Encounter (HOSPITAL_COMMUNITY): Payer: Self-pay | Admitting: Nurse Practitioner

## 2017-04-23 DIAGNOSIS — I10 Essential (primary) hypertension: Secondary | ICD-10-CM | POA: Diagnosis not present

## 2017-04-23 DIAGNOSIS — Z885 Allergy status to narcotic agent status: Secondary | ICD-10-CM | POA: Insufficient documentation

## 2017-04-23 DIAGNOSIS — Z79899 Other long term (current) drug therapy: Secondary | ICD-10-CM | POA: Diagnosis not present

## 2017-04-23 DIAGNOSIS — Z7982 Long term (current) use of aspirin: Secondary | ICD-10-CM | POA: Insufficient documentation

## 2017-04-23 DIAGNOSIS — F17210 Nicotine dependence, cigarettes, uncomplicated: Secondary | ICD-10-CM | POA: Diagnosis not present

## 2017-04-23 DIAGNOSIS — R002 Palpitations: Secondary | ICD-10-CM | POA: Diagnosis not present

## 2017-04-23 HISTORY — DX: Unspecified atrial fibrillation: I48.91

## 2017-04-23 NOTE — ED Notes (Signed)
Pt wants blood draw from pic line.

## 2017-04-23 NOTE — ED Triage Notes (Signed)
Pt presents with c/o palpitations. The palpitations have been intermittent since she was discharged from the emergency department 2 days ago for new onset atrial fibrillation. She reports weakness and chest pain. She has an appointment with a-fib clinic on Tuesday.

## 2017-04-23 NOTE — Telephone Encounter (Signed)
Pt called because she is feeling worse again. She was in the ER with afib 08/23, was referred to the Afib clinic. Has appt 08/28  Felt ok yesterday, but today feels worse. She is tired and weak. She is SOB with exertion. She is feeling  She has a PICC line, is supposed to get it out tomorrow. It was put in because she gets very dehydrated when she preps for a colonoscopy.  She has to give her daughter meds for seizures, so has constraints there also.  Advised pt that the afib is probably causing her sx, requested she go to the ER.  Pt reluctant, but says will probably go.  Rosaria Ferries, Hershal Coria 04/23/2017 10:57 AM Beeper [phone]

## 2017-04-23 NOTE — ED Notes (Signed)
Pt reports feeling weak and having palpitations at approximately 1000hrs this date. Pt states she is feeling better and does not want to stay. Spoke with EDP about seeing the pt prior to her leaving.

## 2017-04-23 NOTE — ED Notes (Signed)
Pt requests labs drawn from PICC line

## 2017-04-25 ENCOUNTER — Encounter (HOSPITAL_COMMUNITY): Payer: Self-pay | Admitting: Nurse Practitioner

## 2017-04-25 ENCOUNTER — Ambulatory Visit (HOSPITAL_COMMUNITY)
Admission: RE | Admit: 2017-04-25 | Discharge: 2017-04-25 | Disposition: A | Discharge status: Home / Self Care | Payer: Managed Care, Other (non HMO) | Source: Ambulatory Visit | Attending: Nurse Practitioner | Admitting: Nurse Practitioner

## 2017-04-25 VITALS — BP 124/70 | HR 113 | Ht 67.0 in | Wt 294.8 lb

## 2017-04-25 DIAGNOSIS — Z8249 Family history of ischemic heart disease and other diseases of the circulatory system: Secondary | ICD-10-CM | POA: Diagnosis not present

## 2017-04-25 DIAGNOSIS — I10 Essential (primary) hypertension: Secondary | ICD-10-CM | POA: Diagnosis not present

## 2017-04-25 DIAGNOSIS — F17210 Nicotine dependence, cigarettes, uncomplicated: Secondary | ICD-10-CM | POA: Insufficient documentation

## 2017-04-25 DIAGNOSIS — Z7982 Long term (current) use of aspirin: Secondary | ICD-10-CM | POA: Diagnosis not present

## 2017-04-25 DIAGNOSIS — Z885 Allergy status to narcotic agent status: Secondary | ICD-10-CM | POA: Insufficient documentation

## 2017-04-25 DIAGNOSIS — I480 Paroxysmal atrial fibrillation: Secondary | ICD-10-CM

## 2017-04-25 DIAGNOSIS — Z9049 Acquired absence of other specified parts of digestive tract: Secondary | ICD-10-CM | POA: Insufficient documentation

## 2017-04-25 DIAGNOSIS — Z833 Family history of diabetes mellitus: Secondary | ICD-10-CM | POA: Diagnosis not present

## 2017-04-25 DIAGNOSIS — Z9889 Other specified postprocedural states: Secondary | ICD-10-CM | POA: Insufficient documentation

## 2017-04-25 DIAGNOSIS — K589 Irritable bowel syndrome without diarrhea: Secondary | ICD-10-CM | POA: Diagnosis not present

## 2017-04-25 DIAGNOSIS — Z6841 Body Mass Index (BMI) 40.0 and over, adult: Secondary | ICD-10-CM | POA: Diagnosis not present

## 2017-04-25 MED ORDER — DILTIAZEM HCL 30 MG PO TABS: ORAL_TABLET | ORAL | 1 refills | Status: DC

## 2017-04-25 NOTE — Patient Instructions (Signed)
Your physician has recommended you make the following change in your medication:  1)Cardizem 30mg  -- take 1 tablet every 4 hours AS NEEDED for AFIB rapid heart rate over 100 as long as top number if blood pressure over 100.

## 2017-04-25 NOTE — Progress Notes (Signed)
Primary Care Physician: System, Pcp Not In Referring Physician: [facility] ER    Julie King is a 26 y.o. female with a h/o afib with visits to [facility] ER  8/23 and 8/26, found to be in afib. H/o HTN, IBS, morbid obesity, tobacco abuse. She is in SR today.   Gives h/o snoring, morning H/A's, daytime somnolence. Had rectal bleeding 3 weeks ago and due to easy  dehydration form prep has pic line inserted. Colonoscopy was benign without signs of bleeding.  Denies alcohol or excessive caffeine. Has chadsvasc score of 1 for female. On baby asa daily. On metoprolol since ER visit. Has felt a few fast beats since  then.  Today, she denies symptoms of palpitations, chest pain, shortness of breath, orthopnea, PND, lower extremity edema, dizziness, presyncope, syncope, or neurologic sequela. The patient is tolerating medications without difficulties and is otherwise without complaint today.   Past Medical History:  Diagnosis Date  . Atrial fibrillation ([place])   . Hypertension   . Irritable bowel syndrome   . QT prolongation   . T wave inversion in EKG    Past Surgical History:  Procedure Laterality Date  . CESAREAN SECTION    . CHOLECYSTECTOMY    . HERNIA REPAIR    . TONSILLECTOMY      Current Outpatient Prescriptions  Medication Sig Dispense Refill  . albuterol (PROVENTIL HFA;VENTOLIN HFA) 108 (90 Base) MCG/ACT inhaler Inhale into the lungs every 6 (six) hours as needed for wheezing or shortness of breath.    Marland Kitchen aspirin EC 325 MG tablet Take 1 tablet (325 mg total) by mouth daily. 30 tablet 0  . metoprolol tartrate (LOPRESSOR) 25 MG tablet Take 1 tablet (25 mg total) by mouth 2 (two) times daily. 20 tablet 0  . diltiazem (CARDIZEM) 30 MG tablet Take 1 tablet every 4 hours AS NEEDED for AFIB rapid heart rate 45 tablet 1  . ibuprofen (ADVIL,MOTRIN) 800 MG tablet Take 1 tablet (800 mg total) by mouth every 8 (eight) hours as needed for moderate pain. (Patient not taking: Reported on 04/25/2017) 21  tablet 0  . traMADol (ULTRAM) 50 MG tablet Take 1 tablet (50 mg total) by mouth every 6 (six) hours as needed. (Patient not taking: Reported on 04/25/2017) 20 tablet 0   No current facility-administered medications for this encounter.     Allergies  Allergen Reactions  . Percocet [Oxycodone-Acetaminophen] Hives and Itching    Social History   Social History  . Marital status: Single    Spouse name: N/A  . Number of children: N/A  . Years of education: N/A   Occupational History  . Not on file.   Social History Main Topics  . Smoking status: Current Every Day Smoker    Packs/day: 0.50    Types: Cigarettes  . Smokeless tobacco: Never Used  . Alcohol use Yes     Comment: rare  . Drug use: No  . Sexual activity: Not on file   Other Topics Concern  . Not on file   Social History Narrative  . No narrative on file    Family History  Problem Relation Age of Onset  . Hypertension Other   . Diabetes Other     ROS- All systems are reviewed and negative except as per the HPI above  Physical Exam: Vitals:   04/25/17 0922  BP: 124/70  Pulse: (!) 113  Weight: 294 lb 12.8 oz (133.7 kg)  Height: 5\' 7"  (1.702 m)   Wt Readings from  Last 3 Encounters:  04/25/17 294 lb 12.8 oz (133.7 kg)  04/23/17 280 lb (127 kg)  04/20/17 290 lb (131.5 kg)    Labs: Lab Results  Component Value Date   NA 135 04/20/2017   K 3.9 04/20/2017   CL 104 04/20/2017   CO2 22 04/20/2017   GLUCOSE 87 04/20/2017   BUN 8 04/20/2017   CREATININE 0.65 04/20/2017   CALCIUM 8.7 (L) 04/20/2017   MG 1.8 04/20/2017   Lab Results  Component Value Date   INR 1.16 12/30/2016   No results found for: CHOL, HDL, LDLCALC, TRIG   GEN- The patient is well appearing, alert and oriented x 3 today.   Head- normocephalic, atraumatic Eyes-  Sclera clear, conjunctiva pink Ears- hearing intact Oropharynx- clear Neck- supple, no JVP Lymph- no cervical lymphadenopathy Lungs- Clear to ausculation  bilaterally, normal work of breathing Heart- Regular rate and rhythm, no murmurs, rubs or gallops, PMI not laterally displaced GI- soft, NT, ND, + BS Extremities- no clubbing, cyanosis, or edema MS- no significant deformity or atrophy Skin- no rash or lesion Psych- euthymic mood, full affect Neuro- strength and sensation are intact  EKG- Sinus tach at 113 bpm, Pr int 122 ms, qrs int 88 ms, qtc 452 ms Epic records reviewed    Assessment and Plan: 1. Paroxymal afib General eduction re afib Continue metoprolol as prescribed Will rx 30 mg cardizem as needed for a HR above 100 in afib and a systolic BP of at least 865. Echo ordered  2. Lifestyle issues Pt states that she has gained to her current weight since pregnancy 5 years ago Weight loss encouraged Exercise encouraged  Snores with apnea spells Sleep study ordered Encouraged to stop smoking    F/u in afib clinic in 3 weeks  Butch Penny C. Carroll, [facility] [address] [place], [place] [address] [phone]

## 2017-04-25 NOTE — ED Provider Notes (Signed)
[facility] DEPT Provider Note   CSN: [id number] Arrival date & time: 04/23/17  1729     History   Chief Complaint Chief Complaint  Patient presents with  . Palpitations    HPI Julie King is a 26 y.o. female.  HPI Patient was seen on 04/20/17 and diagnosed with new onset atrial fibrillation. Patient was started on metoprolol and given follow-up atrial fibrillation clinic. The patient presents with episodic palpitations starting this morning. Associated with mild fatigue. She currently is asymptomatic. She denies any chest pain or shortness of breath. Neurologic signs remained stable. She is currently in normal sinus rhythm. She does not want any further workup and would like to be discharged to follow-up in the atrial fibrillation clinic in 2 days. Past Medical History:  Diagnosis Date  . Atrial fibrillation ([place])   . Hypertension   . Irritable bowel syndrome   . QT prolongation   . T wave inversion in EKG     There are no active problems to display for this patient.   Past Surgical History:  Procedure Laterality Date  . CESAREAN SECTION    . CHOLECYSTECTOMY    . HERNIA REPAIR    . TONSILLECTOMY      OB History    Gravida Para Term Preterm AB Living   1 1 1     1    SAB TAB Ectopic Multiple Live Births           1       Home Medications    Prior to Admission medications   Medication Sig Start Date End Date Taking? Authorizing Provider  albuterol (PROVENTIL HFA;VENTOLIN HFA) 108 (90 Base) MCG/ACT inhaler Inhale into the lungs every 6 (six) hours as needed for wheezing or shortness of breath.   Yes [provider]  aspirin EC 325 MG tablet Take 1 tablet (325 mg total) by mouth daily. 04/21/17  Yes Varney Biles, MD  metoprolol tartrate (LOPRESSOR) 25 MG tablet Take 1 tablet (25 mg total) by mouth 2 (two) times daily. 04/21/17  Yes Varney Biles, MD  diltiazem (CARDIZEM) 30 MG tablet Take 1 tablet every 4 hours AS NEEDED for AFIB rapid heart rate  04/25/17   Sherran Needs, NP  ibuprofen (ADVIL,MOTRIN) 800 MG tablet Take 1 tablet (800 mg total) by mouth every 8 (eight) hours as needed for moderate pain. Patient not taking: Reported on 04/25/2017 03/26/17   Milton Ferguson, MD  traMADol (ULTRAM) 50 MG tablet Take 1 tablet (50 mg total) by mouth every 6 (six) hours as needed. Patient not taking: Reported on 04/25/2017 03/28/17 03/28/18  Julianne Handler, CNM    Family History Family History  Problem Relation Age of Onset  . Hypertension Other   . Diabetes Other     Social History Social History  Substance Use Topics  . Smoking status: Current Every Day Smoker    Packs/day: 0.50    Types: Cigarettes  . Smokeless tobacco: Never Used  . Alcohol use Yes     Comment: rare     Allergies   Percocet [oxycodone-acetaminophen]   Review of Systems Review of Systems  Constitutional: Positive for fatigue. Negative for chills and fever.  Respiratory: Negative for cough and shortness of breath.   Cardiovascular: Positive for palpitations. Negative for chest pain and leg swelling.  Gastrointestinal: Negative for abdominal pain, diarrhea, nausea and vomiting.  Musculoskeletal: Negative for back pain, myalgias, neck pain and neck stiffness.  Skin: Negative for rash and wound.  Neurological: Negative for  dizziness, weakness, light-headedness, numbness and headaches.  All other systems reviewed and are negative.    Physical Exam Updated Vital Signs BP 122/81   Pulse 79   Temp 98.2 F (36.8 C) (Oral)   Resp 11   Ht 5\' 7"  (1.702 m)   Wt 127 kg (280 lb)   LMP 04/09/2017 (Within Days)   SpO2 100%   BMI 43.85 kg/m   Physical Exam  Constitutional: She is oriented to person, place, and time. She appears well-developed and well-nourished. No distress.  HENT:  Head: Normocephalic and atraumatic.  Mouth/Throat: Oropharynx is clear and moist. No oropharyngeal exudate.  Eyes: Pupils are equal, round, and reactive to light. EOM are  normal.  Neck: Normal range of motion. Neck supple. No thyromegaly present.  Cardiovascular: Normal rate and regular rhythm.  Exam reveals no gallop and no friction rub.   No murmur heard. Pulmonary/Chest: Effort normal and breath sounds normal. No respiratory distress. She has no wheezes. She has no rales. She exhibits no tenderness.  Abdominal: Soft. Bowel sounds are normal. There is no tenderness. There is no rebound and no guarding.  Musculoskeletal: Normal range of motion. She exhibits no edema or tenderness.  No lower extremity swelling, asymmetry or tenderness.  Neurological: She is alert and oriented to person, place, and time.  Skin: Skin is warm and dry. No rash noted. No erythema.  Psychiatric: She has a normal mood and affect. Her behavior is normal.  Nursing note and vitals reviewed.    ED Treatments / Results  Labs (all labs ordered are listed, but only abnormal results are displayed) Labs Reviewed - No data to display  EKG  EKG Interpretation  Date/Time:  Sunday April 23 2017 17:36:29 EDT Ventricular Rate:  98 PR Interval:  126 QRS Duration: 90 QT Interval:  352 QTC Calculation: 449 R Axis:   37 Text Interpretation:  Normal sinus rhythm Nonspecific T wave abnormality Abnormal ECG Confirmed by Lita Mains  MD, Kolton Kienle ([id number]) on 04/23/2017 7:33:41 PM Also confirmed by Lita Mains  MD, Nathanial Arrighi ([id number]), editor Radene Gunning [phone])  on 04/24/2017 8:02:43 AM       Radiology No results found.  Procedures Procedures (including critical care time)  Medications Ordered in ED Medications - No data to display   Initial Impression / Assessment and Plan / ED Course  I have reviewed the triage vital signs and the nursing notes.  Pertinent labs & imaging results that were available during my care of the patient were reviewed by me and considered in my medical decision making (see chart for details).     Patient with recent extensive workup for new onset atrial  fibrillation. She currently is asymptomatic. She does not want further workup at this time. Has close follow-up in atrial fibrillation clinic. She's been given strict return precautions has voiced understanding.  Final Clinical Impressions(s) / ED Diagnoses   Final diagnoses:  Palpitations    New Prescriptions Discharge Medication List as of 04/23/2017  7:57 PM       Julianne Rice, MD 04/25/17 1559

## 2017-04-28 ENCOUNTER — Telehealth: Payer: Self-pay | Admitting: *Deleted

## 2017-04-28 DIAGNOSIS — I4891 Unspecified atrial fibrillation: Secondary | ICD-10-CM

## 2017-04-28 NOTE — Telephone Encounter (Signed)
Informed patient of upcoming sleep study and patient understanding was verbalized. Patient understands her sleep study is scheduled for Tuesday June 20 2017. Patient understands her sleep study will be done at [facility] sleep lab. Patient understands she will receive a sleep packet in a week or so. Patient understands to call if she does not receive the sleep packet in a timely manner. Patient agrees with treatment and thanked me for call.

## 2017-04-28 NOTE — Telephone Encounter (Signed)
-----   Message from Juluis Mire, RN sent at 04/25/2017 11:06 AM EDT ----- Regarding: sleep study Pt needs sleep study for afib. Pt will be expecting call. Thanks Stacy :)

## 2017-05-02 ENCOUNTER — Ambulatory Visit (HOSPITAL_COMMUNITY)
Admission: RE | Admit: 2017-05-02 | Discharge: 2017-05-02 | Disposition: A | Discharge status: Home / Self Care | Payer: Managed Care, Other (non HMO) | Source: Ambulatory Visit | Attending: Nurse Practitioner | Admitting: Nurse Practitioner

## 2017-05-02 DIAGNOSIS — I480 Paroxysmal atrial fibrillation: Secondary | ICD-10-CM

## 2017-05-02 DIAGNOSIS — I4891 Unspecified atrial fibrillation: Secondary | ICD-10-CM | POA: Diagnosis present

## 2017-05-02 NOTE — Progress Notes (Signed)
  Echocardiogram 2D Echocardiogram has been performed.  Julie King 05/02/2017, 12:15 PM

## 2017-05-13 ENCOUNTER — Telehealth: Payer: Self-pay | Admitting: Physician Assistant

## 2017-05-13 NOTE — Telephone Encounter (Signed)
Patient called answering service. Pt reports feeling generally poor today, also has noticed increased palpitations like she is in atrial fib, HR 90s, BP also lower than usual 90/78. She checked this at [facility] so validity not clear. She reports feeling more SOB than usual as well. It is unclear to me why this 26 year old has developed paroxysmal atrial fib; I am concerned with her increased symptoms, recent abnormal CBC with microcytic anemia and leukocytosis that there is more significant underlying picture contributing to her increased arrhythmia burden. In light of increased sx I advised her to proceed to ER for further evaluation. She is currently near [facility] and plans to proceed there.  Serenity Batley PA-C

## 2017-05-16 ENCOUNTER — Ambulatory Visit (HOSPITAL_COMMUNITY)
Admission: RE | Admit: 2017-05-16 | Discharge: 2017-05-16 | Disposition: A | Discharge status: Home / Self Care | Payer: Managed Care, Other (non HMO) | Source: Ambulatory Visit | Attending: Nurse Practitioner | Admitting: Nurse Practitioner

## 2017-05-16 VITALS — BP 112/80 | HR 93 | Ht 67.0 in | Wt 292.4 lb

## 2017-05-16 DIAGNOSIS — Z79899 Other long term (current) drug therapy: Secondary | ICD-10-CM | POA: Diagnosis not present

## 2017-05-16 DIAGNOSIS — I10 Essential (primary) hypertension: Secondary | ICD-10-CM | POA: Insufficient documentation

## 2017-05-16 DIAGNOSIS — I480 Paroxysmal atrial fibrillation: Secondary | ICD-10-CM | POA: Diagnosis not present

## 2017-05-16 DIAGNOSIS — Z6841 Body Mass Index (BMI) 40.0 and over, adult: Secondary | ICD-10-CM | POA: Insufficient documentation

## 2017-05-16 DIAGNOSIS — K589 Irritable bowel syndrome without diarrhea: Secondary | ICD-10-CM | POA: Diagnosis not present

## 2017-05-16 DIAGNOSIS — Z885 Allergy status to narcotic agent status: Secondary | ICD-10-CM | POA: Insufficient documentation

## 2017-05-16 DIAGNOSIS — Z7982 Long term (current) use of aspirin: Secondary | ICD-10-CM | POA: Diagnosis not present

## 2017-05-16 DIAGNOSIS — I4891 Unspecified atrial fibrillation: Secondary | ICD-10-CM | POA: Diagnosis present

## 2017-05-16 DIAGNOSIS — F17210 Nicotine dependence, cigarettes, uncomplicated: Secondary | ICD-10-CM | POA: Insufficient documentation

## 2017-05-16 DIAGNOSIS — R0683 Snoring: Secondary | ICD-10-CM | POA: Insufficient documentation

## 2017-05-16 NOTE — Progress Notes (Signed)
Primary Care Physician: System, Pcp Not In Referring Physician: [facility]    Julie King is a 26 y.o. female with a h/o afib with visits to [facility] ER  8/23 and 8/26, found to be in afib. H/o HTN, IBS, morbid obesity, tobacco abuse. She is in SR today.   Gives h/o snoring, morning H/A's, daytime somnolence. Had rectal bleeding 3 weeks ago and due to easy  dehydration form prep has pic line inserted for Colonoscopy, which was benign without signs of bleeding.  Denies alcohol or excessive caffeine. Has chadsvasc score of 1 for female. On baby asa daily. On metoprolol since ER visit. Has felt a few fast beats since then.  Was seen initially in [facility] 8/26. Lifestyle measures discussed with pt, sleep study pending. Also strongly encouraged weight loss and exercise. Pt did get back to the gym and has altered her diet and has lost 6 lbs. She still has days that she does not feel well and may be due to afib. We discussed event monitor to help evaluate those symptoms. She is anemic and PCP is working that up. Echo done and essentially normal.  Today, she denies symptoms of palpitations, chest pain, shortness of breath, orthopnea, PND, lower extremity edema, dizziness, presyncope, syncope, or neurologic sequela. The patient is tolerating medications without difficulties and is otherwise without complaint today.   Past Medical History:  Diagnosis Date  . Atrial fibrillation ([place])   . Hypertension   . Irritable bowel syndrome   . QT prolongation   . T wave inversion in EKG    Past Surgical History:  Procedure Laterality Date  . CESAREAN SECTION    . CHOLECYSTECTOMY    . HERNIA REPAIR    . TONSILLECTOMY      Current Outpatient Prescriptions  Medication Sig Dispense Refill  . albuterol (PROVENTIL HFA;VENTOLIN HFA) 108 (90 Base) MCG/ACT inhaler Inhale into the lungs every 6 (six) hours as needed for wheezing or shortness of breath.    Marland Kitchen aspirin EC 325 MG tablet Take 1 tablet (325 mg total) by  mouth daily. 30 tablet 0  . diltiazem (CARDIZEM) 30 MG tablet Take 1 tablet every 4 hours AS NEEDED for AFIB rapid heart rate 45 tablet 1  . ibuprofen (ADVIL,MOTRIN) 800 MG tablet Take 1 tablet (800 mg total) by mouth every 8 (eight) hours as needed for moderate pain. 21 tablet 0  . metoprolol tartrate (LOPRESSOR) 25 MG tablet Take 1 tablet (25 mg total) by mouth 2 (two) times daily. 20 tablet 0  . traMADol (ULTRAM) 50 MG tablet Take 1 tablet (50 mg total) by mouth every 6 (six) hours as needed. 20 tablet 0   No current facility-administered medications for this encounter.     Allergies  Allergen Reactions  . Percocet [Oxycodone-Acetaminophen] Hives and Itching    Social History   Social History  . Marital status: Single    Spouse name: N/A  . Number of children: N/A  . Years of education: N/A   Occupational History  . Not on file.   Social History Main Topics  . Smoking status: Current Every Day Smoker    Packs/day: 0.50    Types: Cigarettes  . Smokeless tobacco: Never Used  . Alcohol use Yes     Comment: rare  . Drug use: No  . Sexual activity: Not on file   Other Topics Concern  . Not on file   Social History Narrative  . No narrative on file  Family History  Problem Relation Age of Onset  . Hypertension Other   . Diabetes Other     ROS- All systems are reviewed and negative except as per the HPI above  Physical Exam: Vitals:   05/16/17 0926  BP: 112/80  Pulse: 93  Weight: 292 lb 6.4 oz (132.6 kg)  Height: 5\' 7"  (1.702 m)   Wt Readings from Last 3 Encounters:  05/16/17 292 lb 6.4 oz (132.6 kg)  04/25/17 294 lb 12.8 oz (133.7 kg)  04/23/17 280 lb (127 kg)    Labs: Lab Results  Component Value Date   NA 135 04/20/2017   K 3.9 04/20/2017   CL 104 04/20/2017   CO2 22 04/20/2017   GLUCOSE 87 04/20/2017   BUN 8 04/20/2017   CREATININE 0.65 04/20/2017   CALCIUM 8.7 (L) 04/20/2017   MG 1.8 04/20/2017   Lab Results  Component Value Date   INR  1.16 12/30/2016   No results found for: CHOL, HDL, LDLCALC, TRIG   GEN- The patient is well appearing, alert and oriented x 3 today.   Head- normocephalic, atraumatic Eyes-  Sclera clear, conjunctiva pink Ears- hearing intact Oropharynx- clear Neck- supple, no JVP Lymph- no cervical lymphadenopathy Lungs- Clear to ausculation bilaterally, normal work of breathing Heart- Regular rate and rhythm, no murmurs, rubs or gallops, PMI not laterally displaced GI- soft, NT, ND, + BS Extremities- no clubbing, cyanosis, or edema MS- no significant deformity or atrophy Skin- no rash or lesion Psych- euthymic mood, full affect Neuro- strength and sensation are intact  EKG- NSR at 93 bpm, Pr int 128 ms, qrs int 88 ms, qtc 440 ms Epic records reviewed Echo-- Left ventricle: The cavity size was normal. Wall thickness was   normal. Systolic function was normal. The estimated ejection   fraction was in the range of 55% to 60%. Wall motion was normal;   there were no regional wall motion abnormalities. Left   ventricular diastolic function parameters were normal.  Impressions:  - Normal LV systolic and diastolic function.    Assessment and Plan: 1. Paroxymal afib General eduction re afib Continue metoprolol as prescribed 30 day event monitor for intermittent palpitations Will rx 30 mg cardizem as needed for a HR above 100 in afib and a systolic BP of at least 361. Echo reviewed with pt  2. Lifestyle issues Pt states that she has gained to her current weight since pregnancy 5 years ago Weight loss encouraged She has lost 6 lbs, congratulated pt on her weight loss efforts and encouraged her to continue efforts She is going to the gym several times a week Snores with apnea spells Sleep study pending Encouraged to stop smoking    F/u in afib clinic after monitor in 6-7 weks  Niralya Ohanian C. Daleisa Halperin, [facility] [address] [place], [place]  [address] [phone]

## 2017-05-31 ENCOUNTER — Other Ambulatory Visit (HOSPITAL_COMMUNITY): Payer: Self-pay | Admitting: Nurse Practitioner

## 2017-05-31 ENCOUNTER — Ambulatory Visit (INDEPENDENT_AMBULATORY_CARE_PROVIDER_SITE_OTHER): Payer: Managed Care, Other (non HMO)

## 2017-05-31 DIAGNOSIS — I480 Paroxysmal atrial fibrillation: Secondary | ICD-10-CM

## 2017-05-31 DIAGNOSIS — R9431 Abnormal electrocardiogram [ECG] [EKG]: Secondary | ICD-10-CM | POA: Diagnosis not present

## 2017-06-16 ENCOUNTER — Telehealth: Payer: Self-pay | Admitting: *Deleted

## 2017-06-16 NOTE — Telephone Encounter (Signed)
x

## 2017-06-19 ENCOUNTER — Emergency Department (HOSPITAL_COMMUNITY)
Admission: EM | Admit: 2017-06-19 | Discharge: 2017-06-19 | Disposition: A | Discharge status: Home / Self Care | Payer: Managed Care, Other (non HMO) | Attending: Emergency Medicine | Admitting: Emergency Medicine

## 2017-06-19 ENCOUNTER — Emergency Department (HOSPITAL_COMMUNITY): Payer: Managed Care, Other (non HMO)

## 2017-06-19 ENCOUNTER — Encounter (HOSPITAL_COMMUNITY): Payer: Self-pay

## 2017-06-19 DIAGNOSIS — F17210 Nicotine dependence, cigarettes, uncomplicated: Secondary | ICD-10-CM | POA: Diagnosis not present

## 2017-06-19 DIAGNOSIS — Z79899 Other long term (current) drug therapy: Secondary | ICD-10-CM | POA: Diagnosis not present

## 2017-06-19 DIAGNOSIS — R002 Palpitations: Secondary | ICD-10-CM | POA: Diagnosis present

## 2017-06-19 DIAGNOSIS — I10 Essential (primary) hypertension: Secondary | ICD-10-CM | POA: Diagnosis not present

## 2017-06-19 DIAGNOSIS — Z7982 Long term (current) use of aspirin: Secondary | ICD-10-CM | POA: Insufficient documentation

## 2017-06-19 LAB — CBC WITH DIFFERENTIAL/PLATELET
BASOS PCT: 0 %
Basophils Absolute: 0 10*3/uL (ref 0.0–0.1)
EOS ABS: 0.5 10*3/uL (ref 0.0–0.7)
Eosinophils Relative: 4 %
HCT: 35.9 % — ABNORMAL LOW (ref 36.0–46.0)
Hemoglobin: 11.4 g/dL — ABNORMAL LOW (ref 12.0–15.0)
Lymphocytes Relative: 23 %
Lymphs Abs: 2.7 10*3/uL (ref 0.7–4.0)
MCH: 19.5 pg — AB (ref 26.0–34.0)
MCHC: 31.8 g/dL (ref 30.0–36.0)
MCV: 61.5 fL — ABNORMAL LOW (ref 78.0–100.0)
MONO ABS: 0.6 10*3/uL (ref 0.1–1.0)
Monocytes Relative: 5 %
NEUTROS PCT: 68 %
Neutro Abs: 7.9 10*3/uL — ABNORMAL HIGH (ref 1.7–7.7)
PLATELETS: 206 10*3/uL (ref 150–400)
RBC: 5.84 MIL/uL — AB (ref 3.87–5.11)
RDW: 15.9 % — ABNORMAL HIGH (ref 11.5–15.5)
WBC: 11.7 10*3/uL — AB (ref 4.0–10.5)

## 2017-06-19 LAB — BASIC METABOLIC PANEL
Anion gap: 9 (ref 5–15)
BUN: 7 mg/dL (ref 6–20)
CO2: 24 mmol/L (ref 22–32)
CREATININE: 0.75 mg/dL (ref 0.44–1.00)
Calcium: 8.7 mg/dL — ABNORMAL LOW (ref 8.9–10.3)
Chloride: 102 mmol/L (ref 101–111)
Glucose, Bld: 86 mg/dL (ref 65–99)
Potassium: 4 mmol/L (ref 3.5–5.1)
Sodium: 135 mmol/L (ref 135–145)

## 2017-06-19 LAB — I-STAT TROPONIN, ED: TROPONIN I, POC: 0.01 ng/mL (ref 0.00–0.08)

## 2017-06-19 NOTE — ED Provider Notes (Addendum)
[facility] EMERGENCY DEPARTMENT Provider Note   CSN: [id number] Arrival date & time: 06/19/17  0732     History   Chief Complaint Chief Complaint  Patient presents with  . Palpitations    HPI Julie King is a 26 y.o. female.  HPI  26 year old female with a history of paroxysmal atrial fibrillation presents with palpitations and chest pressure.  She has been having the chest pressure for about 2 or 3 days.  This is constant.  She has on and off brief episodes of palpitations about every hour or so at home.  Sometimes these come with lightheadedness.  Sometimes there is lightheadedness other times.  She also has been having a mild headache that usually occurs about 3 PM over the last 2 days.  Goes away with Excedrin.  No shortness of breath, cough, fever, or weakness.  She has been taking her Toprol and aspirin as prescribed.  She is currently wearing a heart monitor and she is to follow back up in the A. fib clinic at the beginning of November.  No new leg swelling.  Past Medical History:  Diagnosis Date  . Atrial fibrillation ([place])   . Hypertension   . Irritable bowel syndrome   . QT prolongation   . T wave inversion in EKG     There are no active problems to display for this patient.   Past Surgical History:  Procedure Laterality Date  . CESAREAN SECTION    . CHOLECYSTECTOMY    . HERNIA REPAIR    . TONSILLECTOMY      OB History    Gravida Para Term Preterm AB Living   1 1 1     1    SAB TAB Ectopic Multiple Live Births           1       Home Medications    Prior to Admission medications   Medication Sig Start Date End Date Taking? Authorizing Provider  albuterol (PROVENTIL HFA;VENTOLIN HFA) 108 (90 Base) MCG/ACT inhaler Inhale into the lungs every 6 (six) hours as needed for wheezing or shortness of breath.    [provider]  aspirin EC 325 MG tablet Take 1 tablet (325 mg total) by mouth daily. 04/21/17   Varney Biles, MD    diltiazem (CARDIZEM) 30 MG tablet Take 1 tablet every 4 hours AS NEEDED for AFIB rapid heart rate 04/25/17   Sherran Needs, NP  ibuprofen (ADVIL,MOTRIN) 800 MG tablet Take 1 tablet (800 mg total) by mouth every 8 (eight) hours as needed for moderate pain. 03/26/17   Milton Ferguson, MD  metoprolol tartrate (LOPRESSOR) 25 MG tablet Take 1 tablet (25 mg total) by mouth 2 (two) times daily. 04/21/17   Varney Biles, MD  traMADol (ULTRAM) 50 MG tablet Take 1 tablet (50 mg total) by mouth every 6 (six) hours as needed. 03/28/17 03/28/18  Julianne Handler, CNM    Family History Family History  Problem Relation Age of Onset  . Hypertension Other   . Diabetes Other     Social History Social History  Substance Use Topics  . Smoking status: Current Every Day Smoker    Packs/day: 0.50    Types: Cigarettes  . Smokeless tobacco: Never Used  . Alcohol use Yes     Comment: rare     Allergies   Percocet [oxycodone-acetaminophen]   Review of Systems Review of Systems  Constitutional: Negative for fever.  Respiratory: Negative for shortness of breath.   Cardiovascular:  Positive for chest pain and palpitations.  Gastrointestinal: Negative for abdominal pain and vomiting.  Neurological: Positive for light-headedness and headaches.  All other systems reviewed and are negative.    Physical Exam Updated Vital Signs BP 131/69 (BP Location: Right Arm)   Pulse (!) 102   Temp 98.7 F (37.1 C) (Oral)   Resp 18   LMP 06/12/2017 (Within Days)   SpO2 100%   Physical Exam  Constitutional: She is oriented to person, place, and time. She appears well-developed and well-nourished. No distress.  obese  HENT:  Head: Normocephalic and atraumatic.  Right Ear: External ear normal.  Left Ear: External ear normal.  Nose: Nose normal.  Eyes: Right eye exhibits no discharge. Left eye exhibits no discharge.  Cardiovascular: Normal rate, regular rhythm and normal heart sounds.   Pulses:      Radial  pulses are 2+ on the right side, and 2+ on the left side.  Pulmonary/Chest: Effort normal and breath sounds normal.  Abdominal: Soft. There is no tenderness.  Musculoskeletal: She exhibits no edema.  Neurological: She is alert and oriented to person, place, and time.  Skin: Skin is warm and dry. She is not diaphoretic.  Nursing note and vitals reviewed.    ED Treatments / Results  Labs (all labs ordered are listed, but only abnormal results are displayed) Labs Reviewed  BASIC METABOLIC PANEL - Abnormal; Notable for the following:       Result Value   Calcium 8.7 (*)    All other components within normal limits  CBC WITH DIFFERENTIAL/PLATELET - Abnormal; Notable for the following:    WBC 11.7 (*)    RBC 5.84 (*)    Hemoglobin 11.4 (*)    HCT 35.9 (*)    MCV 61.5 (*)    MCH 19.5 (*)    RDW 15.9 (*)    All other components within normal limits  I-STAT TROPONIN, ED    EKG  EKG Interpretation  Date/Time:  Monday June 19 2017 07:39:24 EDT Ventricular Rate:  93 PR Interval:  130 QRS Duration: 86 QT Interval:  366 QTC Calculation: 455 R Axis:   54 Text Interpretation:  Normal sinus rhythm Nonspecific ST and T wave abnormality Abnormal ECG no significant change since Sept 2018 Confirmed by Sherwood Gambler [phone]) on 06/19/2017 8:09:02 AM       Radiology Dg Chest 2 View  Result Date: 06/19/2017 CLINICAL DATA:  Chest pressure. EXAM: CHEST  2 VIEW COMPARISON:  04/20/2017 FINDINGS: The heart size and mediastinal contours are within normal limits. Both lungs are clear. The visualized skeletal structures are unremarkable. IMPRESSION: Normal chest. Electronically Signed   By: Lorriane Shire M.D.   On: 06/19/2017 08:55    Procedures Procedures (including critical care time)  Medications Ordered in ED Medications - No data to display   Initial Impression / Assessment and Plan / ED Course  I have reviewed the triage vital signs and the nursing notes.  Pertinent labs &  imaging results that were available during my care of the patient were reviewed by me and considered in my medical decision making (see chart for details).     No significant arrhythmia while in the ED.  Her electrolytes are benign besides mildly low calcium.  Her white blood cell count is mildly elevated but this appears chronic.  I have discussed there are no emergent findings currently and she is to call her A. fib doctor today for further outpatient management.  I doubt ACS, PE,  dissection. Negative troponin after 48+ hours of chest pressure. Return precautions.  Final Clinical Impressions(s) / ED Diagnoses   Final diagnoses:  Heart palpitations    New Prescriptions New Prescriptions   No medications on file     Sherwood Gambler, MD 06/19/17 [place], MD 06/19/17 [phone]

## 2017-06-19 NOTE — ED Notes (Signed)
Patient transported to X-ray 

## 2017-06-19 NOTE — ED Notes (Signed)
Pt ambulated to waiting room without difficulty or c/o dizziness.

## 2017-06-19 NOTE — ED Notes (Signed)
ED Provider at bedside. 

## 2017-06-19 NOTE — ED Triage Notes (Signed)
Pt states she has hx of afib. Has monitor in place on her chest. She also reports chest pressure X2 days. EKG shows NSR. Vitals stable, skin warm and dry.

## 2017-06-20 ENCOUNTER — Emergency Department (HOSPITAL_COMMUNITY): Payer: Managed Care, Other (non HMO)

## 2017-06-20 ENCOUNTER — Encounter (HOSPITAL_BASED_OUTPATIENT_CLINIC_OR_DEPARTMENT_OTHER): Payer: Managed Care, Other (non HMO)

## 2017-06-20 ENCOUNTER — Emergency Department (HOSPITAL_COMMUNITY)
Admission: EM | Admit: 2017-06-20 | Discharge: 2017-06-20 | Disposition: A | Discharge status: Home / Self Care | Payer: Managed Care, Other (non HMO) | Attending: Emergency Medicine | Admitting: Emergency Medicine

## 2017-06-20 DIAGNOSIS — Z7982 Long term (current) use of aspirin: Secondary | ICD-10-CM | POA: Diagnosis not present

## 2017-06-20 DIAGNOSIS — Z9049 Acquired absence of other specified parts of digestive tract: Secondary | ICD-10-CM | POA: Diagnosis not present

## 2017-06-20 DIAGNOSIS — F17210 Nicotine dependence, cigarettes, uncomplicated: Secondary | ICD-10-CM | POA: Diagnosis not present

## 2017-06-20 DIAGNOSIS — I10 Essential (primary) hypertension: Secondary | ICD-10-CM | POA: Diagnosis not present

## 2017-06-20 DIAGNOSIS — R253 Fasciculation: Secondary | ICD-10-CM | POA: Insufficient documentation

## 2017-06-20 DIAGNOSIS — R079 Chest pain, unspecified: Secondary | ICD-10-CM | POA: Insufficient documentation

## 2017-06-20 DIAGNOSIS — G514 Facial myokymia: Secondary | ICD-10-CM

## 2017-06-20 DIAGNOSIS — Z79899 Other long term (current) drug therapy: Secondary | ICD-10-CM | POA: Insufficient documentation

## 2017-06-20 LAB — POCT I-STAT TROPONIN I
TROPONIN I, POC: 0 ng/mL (ref 0.00–0.08)
Troponin i, poc: 0 ng/mL (ref 0.00–0.08)

## 2017-06-20 LAB — BASIC METABOLIC PANEL
Anion gap: 8 (ref 5–15)
BUN: 9 mg/dL (ref 6–20)
CALCIUM: 9.4 mg/dL (ref 8.9–10.3)
CO2: 26 mmol/L (ref 22–32)
CREATININE: 0.79 mg/dL (ref 0.44–1.00)
Chloride: 104 mmol/L (ref 101–111)
GFR calc non Af Amer: >60 mL/min (ref >60)
GLUCOSE: 92 mg/dL (ref 65–99)
Potassium: 4.1 mmol/L (ref 3.5–5.1)
Sodium: 138 mmol/L (ref 135–145)

## 2017-06-20 LAB — CBC
HCT: 34.7 % — ABNORMAL LOW (ref 36.0–46.0)
Hemoglobin: 11 g/dL — ABNORMAL LOW (ref 12.0–15.0)
MCH: 19.4 pg — AB (ref 26.0–34.0)
MCHC: 31.7 g/dL (ref 30.0–36.0)
MCV: 61.1 fL — AB (ref 78.0–100.0)
PLATELETS: 227 10*3/uL (ref 150–400)
RBC: 5.68 MIL/uL — ABNORMAL HIGH (ref 3.87–5.11)
RDW: 16 % — AB (ref 11.5–15.5)
WBC: 13 10*3/uL — ABNORMAL HIGH (ref 4.0–10.5)

## 2017-06-20 NOTE — ED Provider Notes (Signed)
[facility] DEPT Provider Note   CSN: [id number] Arrival date & time: 06/20/17  1522     History   Chief Complaint Chief Complaint  Patient presents with  . Chest Pain  . Weakness    HPI Julie King is a 26 y.o. female.  HPI  Palpitations, chest heaviness yesterday Today had sharp pain 130PM under left breast and began to have mouth twitching that started around the same time, still having sharp chest pains and twitching of upper lip Lightheadedness for a few days Shortness of breath when laying down, no shortness of breath now Sharp chest pain lasts about 8sec then eases off then returns.  Is severe.  Catches off guard. On a heart monitor for last month.  Reports similar episodes of chest pain for the last few years, biggest concern and difference today is the lip twitching  Reports weakness, initially reports in both arms then reports worse in bilateral legs No numbness, but reports tingling in shoulder yesterday and now in face, around mouth with twitching of upper lip Earlier felt liek vision was blurry, n odouble vision or visual field changes Feeling weak all over  Haven't started any new medications Has had headaches, none today but has been having them more. They do not wake her from sleep. Feeling off for the last few days  Not on estrogen Not on blood thinners Grandma, great aunt, grandma had heart disease. Cousin had stent put in in 38s.    Past Medical History:  Diagnosis Date  . Atrial fibrillation ([place])   . Hypertension   . Irritable bowel syndrome   . QT prolongation   . T wave inversion in EKG     There are no active problems to display for this patient.   Past Surgical History:  Procedure Laterality Date  . CESAREAN SECTION    . CHOLECYSTECTOMY    . HERNIA REPAIR    . TONSILLECTOMY      OB History    Gravida Para Term Preterm AB Living   1 1 1     1    SAB TAB Ectopic Multiple Live Births           1        Home Medications    Prior to Admission medications   Medication Sig Start Date End Date Taking? Authorizing Provider  albuterol (PROVENTIL HFA;VENTOLIN HFA) 108 (90 Base) MCG/ACT inhaler Inhale into the lungs every 6 (six) hours as needed for wheezing or shortness of breath.   Yes [provider]  aspirin EC 325 MG tablet Take 1 tablet (325 mg total) by mouth daily. 04/21/17  Yes Varney Biles, MD  Aspirin-Acetaminophen-Caffeine (EXCEDRIN PO) Take 2 tablets by mouth 2 (two) times daily as needed.   Yes [provider]  diltiazem (CARDIZEM) 30 MG tablet Take 1 tablet every 4 hours AS NEEDED for AFIB rapid heart rate 04/25/17  Yes Sherran Needs, NP  ibuprofen (ADVIL,MOTRIN) 800 MG tablet Take 1 tablet (800 mg total) by mouth every 8 (eight) hours as needed for moderate pain. 03/26/17  Yes Milton Ferguson, MD  metoprolol tartrate (LOPRESSOR) 25 MG tablet Take 1 tablet (25 mg total) by mouth 2 (two) times daily. 04/21/17  Yes Nanavati, Ankit, MD  traMADol (ULTRAM) 50 MG tablet Take 1 tablet (50 mg total) by mouth every 6 (six) hours as needed. Patient not taking: Reported on 06/20/2017 03/28/17 03/28/18  Julianne Handler, CNM    Family History Family History  Problem  Relation Age of Onset  . Hypertension Other   . Diabetes Other     Social History Social History  Substance Use Topics  . Smoking status: Current Every Day Smoker    Packs/day: 0.50    Types: Cigarettes  . Smokeless tobacco: Never Used  . Alcohol use Yes     Comment: rare     Allergies   Percocet [oxycodone-acetaminophen]   Review of Systems Review of Systems  Constitutional: Positive for fatigue. Negative for fever.  Respiratory: Negative for cough and shortness of breath (for a few days when laying down).   Cardiovascular: Positive for chest pain.  Gastrointestinal: Negative for abdominal pain, nausea and vomiting.  Genitourinary: Negative for difficulty urinating and dysuria.    Musculoskeletal: Positive for arthralgias. Negative for back pain.  Skin: Negative for rash.  Neurological: Positive for weakness, numbness (tingling) and headaches. Negative for tremors, facial asymmetry and speech difficulty.     Physical Exam Updated Vital Signs BP 117/72 (BP Location: Left Arm)   Pulse 94   Temp 98.1 F (36.7 C) (Oral)   Resp 18   Ht 5\' 7"  (1.702 m)   Wt 127 kg (280 lb)   LMP 06/12/2017 (Within Days)   SpO2 100%   BMI 43.85 kg/m   Physical Exam  Constitutional: She is oriented to person, place, and time. She appears well-developed and well-nourished. No distress.  Very slight twitching of upper lip  HENT:  Head: Normocephalic and atraumatic.  Eyes: Conjunctivae and EOM are normal.  Neck: Normal range of motion.  Cardiovascular: Normal rate, regular rhythm, normal heart sounds and intact distal pulses.  Exam reveals no gallop and no friction rub.   No murmur heard. Pulmonary/Chest: Effort normal and breath sounds normal. No respiratory distress. She has no wheezes. She has no rales.  Abdominal: Soft. She exhibits no distension. There is no tenderness. There is no guarding.  Musculoskeletal: She exhibits no edema or tenderness.  Neurological: She is alert and oriented to person, place, and time. She has normal strength. No cranial nerve deficit or sensory deficit. Coordination (finger to nose and heel to shin) and gait normal. GCS eye subscore is 4. GCS verbal subscore is 5. GCS motor subscore is 6.  Skin: Skin is warm and dry. No rash noted. She is not diaphoretic. No erythema.  Nursing note and vitals reviewed.    ED Treatments / Results  Labs (all labs ordered are listed, but only abnormal results are displayed) Labs Reviewed  CBC - Abnormal; Notable for the following:       Result Value   WBC 13.0 (*)    RBC 5.68 (*)    Hemoglobin 11.0 (*)    HCT 34.7 (*)    MCV 61.1 (*)    MCH 19.4 (*)    RDW 16.0 (*)    All other components within normal  limits  BASIC METABOLIC PANEL  I-STAT TROPONIN, ED  POCT I-STAT TROPONIN I  I-STAT TROPONIN, ED  POCT I-STAT TROPONIN I    EKG  EKG Interpretation  Date/Time:  Tuesday June 20 2017 15:32:09 EDT Ventricular Rate:  89 PR Interval:    QRS Duration: 93 QT Interval:  354 QTC Calculation: 431 R Axis:   46 Text Interpretation:  Sinus rhythm Borderline T wave abnormalities No significant change since last tracing Confirmed by Gareth Morgan [phone]) on 06/21/2017 12:36:32 AM       Radiology Dg Chest 2 View  Result Date: 06/20/2017 CLINICAL DATA:  Left-sided chest  pain smoking history for 10 years EXAM: CHEST  2 VIEW COMPARISON:  Chest x-ray of 06/19/2017 FINDINGS: No active infiltrate or effusion is seen. Mediastinal and hilar contours are unremarkable. The heart is within normal limits in size. Battery pack overlies the left anterior chest. IMPRESSION: No active cardiopulmonary disease. Electronically Signed   By: Ivar Drape M.D.   On: 06/20/2017 16:01   Dg Chest 2 View  Result Date: 06/19/2017 CLINICAL DATA:  Chest pressure. EXAM: CHEST  2 VIEW COMPARISON:  04/20/2017 FINDINGS: The heart size and mediastinal contours are within normal limits. Both lungs are clear. The visualized skeletal structures are unremarkable. IMPRESSION: Normal chest. Electronically Signed   By: Lorriane Shire M.D.   On: 06/19/2017 08:55    Procedures Procedures (including critical care time)  Medications Ordered in ED Medications - No data to display   Initial Impression / Assessment and Plan / ED Course  I have reviewed the triage vital signs and the nursing notes.  Pertinent labs & imaging results that were available during my care of the patient were reviewed by me and considered in my medical decision making (see chart for details).     26 year old female with a history of atrial fibrillation presents with concern for chest pain and sensation of lip twitching.  Regarding chest pain, this is  not patient's largest concern today, reports she has had similar episodes over the last few years.  She was seen yesterday at [facility].  EKG shows no significant findings, chest patient in normal sinus rhythm.  Chest x-ray shows no evidence of pneumonia, new with pneumothorax or widened mediastinum.  Patient has good pulses bilaterally, have low suspicion for dissection by history, physical exam and imaging.  Patient is PERC negative and low risk wells and have low suspicion for pulmonary embolus.  Delta troponins negative and she is low risk heart score.  She has chest tenderness on exam, pain may represent musculoskeletal.  Recommend following up with cardiology regarding her chest pain and follow up regarding her monitor.    Regarding slight facial twitching noted on exam, she has no neurologic deficits, doubt CVA.  Reports headaches nearly daily but none today, doubt SAH, SDH, meningitis, no red flags of waking up with headaches/vomiting.  Recommend following up with primary care physician regarding frequent headaches for further outpatient evaluation.  The lip twitching is very slight, not consistent with significant dystonic reaction, or seizure like activity.  She has normal calcium, potassium.  Possibly secondary to caffeine use, stress. Recommend PCP follow up, stress reduction, follow up with Cardiology and discussed reasons to return to ED. Patient discharged in stable condition with understanding of reasons to return.   Final Clinical Impressions(s) / ED Diagnoses   Final diagnoses:  Chest pain, unspecified type  Facial twitching    New Prescriptions Discharge Medication List as of 06/20/2017  9:07 PM       Gareth Morgan, MD 06/21/17 0041

## 2017-06-20 NOTE — ED Triage Notes (Signed)
Pt c/o of "chest discomfort" that started two days ago on the left side. Also c/o mouth twitching, dizziness, lightheaded and "weak to stand". Pt states she goes in and out of has afib, a heart monitor is on her chest for about a month. VSS. Pt states she was at [facility] yesterday for chest discomfort but not the twitching.

## 2017-06-26 ENCOUNTER — Telehealth: Payer: Self-pay | Admitting: *Deleted

## 2017-06-26 NOTE — Telephone Encounter (Signed)
Patient rescheduled for December 10th.

## 2017-06-26 NOTE — Telephone Encounter (Signed)
-----   Message from Almyra Free sent at 06/19/2017  2:38 PM EDT ----- Regarding: Patient has not returned call-need to reschedule Patient has not returned my call to give the additional insurance info needed to submit for pre-cert. I would recommend rescheduling. Thank you, Amy

## 2017-07-12 ENCOUNTER — Ambulatory Visit (HOSPITAL_COMMUNITY): Payer: Managed Care, Other (non HMO) | Admitting: Nurse Practitioner

## 2017-07-14 NOTE — Telephone Encounter (Signed)
Late entry: Informed patient of upcoming sleep study and patient understanding was verbalized. Patient understands her sleep study is scheduled for Monday August 07 2017. Patient understands her sleep study will be done at [facility] sleep lab. Patient understands she will receive a sleep packet in a week or so. Patient understands to call if she does not receive the sleep packet in a timely manner. Patient agrees with treatment and thanked me for call.

## 2017-07-17 ENCOUNTER — Encounter (HOSPITAL_COMMUNITY): Payer: Self-pay | Admitting: *Deleted

## 2017-07-21 ENCOUNTER — Other Ambulatory Visit: Payer: Self-pay

## 2017-07-21 ENCOUNTER — Encounter (HOSPITAL_COMMUNITY): Payer: Self-pay

## 2017-07-21 ENCOUNTER — Emergency Department (HOSPITAL_COMMUNITY): Payer: Medicaid Other

## 2017-07-21 ENCOUNTER — Emergency Department (HOSPITAL_COMMUNITY)
Admission: EM | Admit: 2017-07-21 | Discharge: 2017-07-21 | Disposition: A | Discharge status: Home / Self Care | Payer: Medicaid Other | Attending: Emergency Medicine | Admitting: Emergency Medicine

## 2017-07-21 DIAGNOSIS — Z79899 Other long term (current) drug therapy: Secondary | ICD-10-CM | POA: Diagnosis not present

## 2017-07-21 DIAGNOSIS — Z7982 Long term (current) use of aspirin: Secondary | ICD-10-CM | POA: Insufficient documentation

## 2017-07-21 DIAGNOSIS — R51 Headache: Secondary | ICD-10-CM | POA: Diagnosis present

## 2017-07-21 DIAGNOSIS — R519 Headache, unspecified: Secondary | ICD-10-CM

## 2017-07-21 DIAGNOSIS — I10 Essential (primary) hypertension: Secondary | ICD-10-CM | POA: Diagnosis not present

## 2017-07-21 DIAGNOSIS — F17210 Nicotine dependence, cigarettes, uncomplicated: Secondary | ICD-10-CM | POA: Insufficient documentation

## 2017-07-21 LAB — I-STAT CHEM 8, ED
BUN: 11 mg/dL (ref 6–20)
CHLORIDE: 105 mmol/L (ref 101–111)
CREATININE: 0.7 mg/dL (ref 0.44–1.00)
Calcium, Ion: 1.24 mmol/L (ref 1.15–1.40)
GLUCOSE: 83 mg/dL (ref 65–99)
HCT: 38 % (ref 36.0–46.0)
Hemoglobin: 12.9 g/dL (ref 12.0–15.0)
POTASSIUM: 3.5 mmol/L (ref 3.5–5.1)
Sodium: 142 mmol/L (ref 135–145)
TCO2: 25 mmol/L (ref 22–32)

## 2017-07-21 LAB — I-STAT BETA HCG BLOOD, ED (MC, WL, AP ONLY): I-stat hCG, quantitative: <5 m[IU]/mL (ref <5)

## 2017-07-21 MED ORDER — IOPAMIDOL (ISOVUE-300) INJECTION 61%
75.0000 mL | Freq: Once | INTRAVENOUS | Status: AC | PRN
  Administered 2017-07-21: 75 mL via INTRAVENOUS

## 2017-07-21 MED ORDER — IOPAMIDOL (ISOVUE-300) INJECTION 61%
INTRAVENOUS | Status: AC
  Filled 2017-07-21: qty 75

## 2017-07-21 MED ORDER — MAGNESIUM SULFATE 2 GM/50ML IV SOLN
2.0000 g | Freq: Once | INTRAVENOUS | Status: AC
  Administered 2017-07-21: 2 g via INTRAVENOUS
  Filled 2017-07-21: qty 50

## 2017-07-21 MED ORDER — SODIUM CHLORIDE 0.9 % IV BOLUS (SEPSIS)
1000.0000 mL | Freq: Once | INTRAVENOUS | Status: AC
  Administered 2017-07-21: 1000 mL via INTRAVENOUS

## 2017-07-21 NOTE — ED Notes (Signed)
ED Provider at bedside. 

## 2017-07-21 NOTE — ED Provider Notes (Signed)
Patient care assumed from [facility], PA-C at shift change. Please see her note for further.  Briefly, patient presented to the emergency department complaining of headache ongoing for 1 month.  She was sent by primary care for possible neuro imaging. Plan at shift change was for the patient to receive a CT venogram and to follow-up on test results.  Previous provider team commended a lumbar puncture for the patient and she refused.  They question possible pseudotumor cerebri.  Outpatient referral to neurology was put in by previous provider prior to shift change. Plan is for follow up with outpatient neurology unless emergent findings on CT.   Later, I was called to bedside by RN staff who report the patient wanted to leave.  She just completed her CT scan and we are waiting this to be read by radiology at this time.  I explained that I would like her to wait until this has been read by the neuroradiologist so we can discuss findings and plan.  Initially, she is hesitant and tells me that she still needs to leave the hospital.  She tells me she needs to leave because her girlfriend is upset with her.  I again explained the importance of her staying so we can discuss test results and plan using all information that we have.  She agrees to stay for results of this head CT.    5:45 PM -About 15 minutes later I was informed by RN staff that the patient had left prior to discharge. I did not speak to her again and I was informed after she left. Outpatient neurology referral was placed by previous provider. At this time CT scan has not been read by radiology.   6:21 PM - CT V is unremarkable. Outpatient neurology referral was placed by previous provider. Patient left prior to results.      Waynetta Pean, PA-C 07/21/17 Newt Minion, MD 07/21/17 2213

## 2017-07-21 NOTE — ED Notes (Signed)
RN attempted IV access x1.

## 2017-07-21 NOTE — ED Provider Notes (Cosign Needed)
[place] DEPT Provider Note   CSN: [id number] Arrival date & time: 07/21/17  1337     History   Chief Complaint Chief Complaint  Patient presents with  . Headache    HPI   Blood pressure (!) 170/88, pulse 92, temperature 98.6 F (37 C), temperature source Oral, resp. rate 18, last menstrual period 07/03/2017, SpO2 100 %.  Julie King is a 26 y.o. female complaining of global and frontal headache onset 1 month ago associated with intermittently blurred vision, difficulty finding words, irritability and forgetfulness.  She has been taking ibuprofen and acetaminophen at home with some relief, she rates her pain at 6 out of 10, she states the headache has been constant with for 1 month but it waxes and wanes in severity.  It is not as good as it ever been today.  She was seen by her primary care physician and advised to present to the emergency department for possible neuro imaging.  She has a history of atrial fibrillation and she takes metoprolol for that regularly but she discloses that she does not take her aspirin at all for anticoagulation.  Past Medical History:  Diagnosis Date  . Atrial fibrillation ([place])   . Hypertension   . Irritable bowel syndrome   . QT prolongation   . T wave inversion in EKG     There are no active problems to display for this patient.   Past Surgical History:  Procedure Laterality Date  . CESAREAN SECTION    . CHOLECYSTECTOMY    . HERNIA REPAIR    . TONSILLECTOMY      OB History    Gravida Para Term Preterm AB Living   1 1 1     1    SAB TAB Ectopic Multiple Live Births           1       Home Medications    Prior to Admission medications   Medication Sig Start Date End Date Taking? Authorizing Provider  Aspirin-Acetaminophen-Caffeine (EXCEDRIN PO) Take 2 tablets by mouth 2 (two) times daily as needed (migraine).    Yes [provider]  diltiazem (CARDIZEM) 30 MG tablet Take 1 tablet  every 4 hours AS NEEDED for AFIB rapid heart rate 04/25/17  Yes Sherran Needs, NP  metoprolol tartrate (LOPRESSOR) 25 MG tablet Take 1 tablet (25 mg total) by mouth 2 (two) times daily. 04/21/17  Yes Varney Biles, MD  albuterol (PROVENTIL HFA;VENTOLIN HFA) 108 (90 Base) MCG/ACT inhaler Inhale into the lungs every 6 (six) hours as needed for wheezing or shortness of breath.    [provider]  aspirin EC 325 MG tablet Take 1 tablet (325 mg total) by mouth daily. Patient not taking: Reported on 07/21/2017 04/21/17   Varney Biles, MD  ibuprofen (ADVIL,MOTRIN) 800 MG tablet Take 1 tablet (800 mg total) by mouth every 8 (eight) hours as needed for moderate pain. Patient not taking: Reported on 07/21/2017 03/26/17   Milton Ferguson, MD  traMADol (ULTRAM) 50 MG tablet Take 1 tablet (50 mg total) by mouth every 6 (six) hours as needed. Patient not taking: Reported on 06/20/2017 03/28/17 03/28/18  Julianne Handler, CNM    Family History Family History  Problem Relation Age of Onset  . Hypertension Other   . Diabetes Other     Social History Social History   Tobacco Use  . Smoking status: Current Every Day Smoker    Packs/day: 0.50    Types: Cigarettes  .  Smokeless tobacco: Never Used  Substance Use Topics  . Alcohol use: Yes    Comment: rare  . Drug use: No     Allergies   Percocet [oxycodone-acetaminophen]   Review of Systems Review of Systems  A complete review of systems was obtained and all systems are negative except as noted in the HPI and PMH.   Physical Exam Updated Vital Signs BP (!) 146/86   Pulse 88   Temp 98.6 F (37 C) (Oral)   Resp 18   LMP 07/03/2017 (Exact Date)   SpO2 100%   Physical Exam  Constitutional: She is oriented to person, place, and time. She appears well-developed and well-nourished.  HENT:  Head: Normocephalic and atraumatic.  Mouth/Throat: Oropharynx is clear and moist.  Eyes: Conjunctivae and EOM are normal. Pupils are equal,  round, and reactive to light.  No TTP of maxillary or frontal sinuses  No TTP or induration of temporal arteries bilaterally  Neck: Normal range of motion. Neck supple.  FROM to C-spine. Pt can touch chin to chest without discomfort. No TTP of midline cervical spine.   Cardiovascular: Normal rate, regular rhythm and intact distal pulses.  Pulmonary/Chest: Effort normal and breath sounds normal. No respiratory distress. She has no wheezes. She has no rales. She exhibits no tenderness.  Abdominal: Soft. Bowel sounds are normal. There is no tenderness.  Musculoskeletal: Normal range of motion. She exhibits no edema or tenderness.  Neurological: She is alert and oriented to person, place, and time. No cranial nerve deficit.  II-Visual fields grossly intact. III/IV/VI-Extraocular movements intact.  Pupils reactive bilaterally. V/VII-Smile symmetric, equal eyebrow raise,  facial sensation intact VIII- Hearing grossly intact IX/X-Normal gag XI-bilateral shoulder shrug XII-midline tongue extension Motor: 5/5 bilaterally with normal tone and bulk Cerebellar: Normal finger-to-nose  and normal heel-to-shin test.   Romberg negative Ambulates with a coordinated gait   Nursing note and vitals reviewed.    ED Treatments / Results  Labs (all labs ordered are listed, but only abnormal results are displayed) Labs Reviewed  I-STAT CHEM 8, ED  I-STAT BETA HCG BLOOD, ED ([facility], [facility], [facility] ONLY)    EKG  EKG Interpretation None       Radiology No results found.  Procedures Procedures (including critical care time)  Medications Ordered in ED Medications  iopamidol (ISOVUE-300) 61 % injection (not administered)  sodium chloride 0.9 % bolus 1,000 mL (0 mLs Intravenous Stopped 07/21/17 1623)  magnesium sulfate IVPB 2 g 50 mL (0 g Intravenous Stopped 07/21/17 1623)     Initial Impression / Assessment and Plan / ED Course  I have reviewed the triage vital signs and the nursing  notes.  Pertinent labs & imaging results that were available during my care of the patient were reviewed by me and considered in my medical decision making (see chart for details).    Vitals:   07/21/17 1347 07/21/17 1400 07/21/17 1415 07/21/17 1430  BP: (!) 170/88 (!) 149/91  (!) 146/86  Pulse: 92 93 73 88  Resp: 18     Temp: 98.6 F (37 C)     TempSrc: Oral     SpO2: 100% 100% 100% 100%    Medications  iopamidol (ISOVUE-300) 61 % injection (not administered)  sodium chloride 0.9 % bolus 1,000 mL (0 mLs Intravenous Stopped 07/21/17 1623)  magnesium sulfate IVPB 2 g 50 mL (0 g Intravenous Stopped 07/21/17 1623)    Susanne Baumgarner is 26 y.o. female presenting with headache x1 month, nonfocal neurologic exam.  Patient is also reporting blurred vision and some confusion and dizziness with forgetfulness.  Will obtain CT and CT venogram for to evaluate for obesity would consider ideopathic intracranial hypertension.   I have counseled this patient that a LP is needed to assess her intracerebral pressure.  This patient is adamant that she does not want an LP she had a very negative experience with a LP in the remote past, I have had an extensive discussion with her on the risks and benefits of proceeding forward and that vision changes in pseudotumor are permanent.  I have offered her interventional radiology LP and she is declined this, she states that she would like her girlfriend to come to the ED but she cannot come here because she has a car, I tried to suggest that she take a cab or call a friend but that is not possible.  This patient is of sound mind, not clinically intoxicated and has capacity for medical decision making, she can meaningfully discussed the pros and cons of proceeding forward or foregoing LP and she understands the risks of not obtaining LP today.  I will try to expedite an appointment with neurology so they can evaluate her.    Case signed out to PA  Dansie at shift  change. Plan to f/u CTV  Final Clinical Impressions(s) / ED Diagnoses   Final diagnoses:  None    ED Discharge Orders        Ordered    Ambulatory referral to Neurology    Comments:  An appointment is requested in approximately: 72 hours. R/o pseudotumor cerebri, Pt refusing LP in ED   07/21/17 1615       Ridgely Anastacio, Charna Elizabeth 07/21/17 1617    Alvon Nygaard, Elmyra Ricks, PA-C 07/21/17 1624

## 2017-07-21 NOTE — ED Notes (Signed)
While RN was assisting another pt, Pt reported to another nurse that she was leaving. Rica Mote, RN removed pt's IV and informed her that she would be leaving AMA.  At 1700, when PA was at bedside, the PA had informed patient of the risks of leaving AMA and encouraged patient to stay until her results were available.

## 2017-07-21 NOTE — ED Triage Notes (Signed)
She c/o headache at top of head area x 1-2 months. She also reports random episodes of "my lips jerking; and I've been forgetful and stuff". "My doctor told me to come here probably for a CT scan".

## 2017-07-21 NOTE — ED Notes (Signed)
Pt is requesting to leave. RN stated she would talk to the PA. PA made aware

## 2017-07-23 ENCOUNTER — Emergency Department (HOSPITAL_COMMUNITY)
Admission: EM | Admit: 2017-07-23 | Discharge: 2017-07-23 | Disposition: A | Discharge status: Home / Self Care | Payer: Medicaid Other | Attending: Emergency Medicine | Admitting: Emergency Medicine

## 2017-07-23 ENCOUNTER — Encounter (HOSPITAL_COMMUNITY): Payer: Self-pay | Admitting: Emergency Medicine

## 2017-07-23 DIAGNOSIS — I10 Essential (primary) hypertension: Secondary | ICD-10-CM | POA: Diagnosis not present

## 2017-07-23 DIAGNOSIS — R51 Headache: Secondary | ICD-10-CM | POA: Insufficient documentation

## 2017-07-23 DIAGNOSIS — Z9049 Acquired absence of other specified parts of digestive tract: Secondary | ICD-10-CM | POA: Insufficient documentation

## 2017-07-23 DIAGNOSIS — F17210 Nicotine dependence, cigarettes, uncomplicated: Secondary | ICD-10-CM | POA: Diagnosis not present

## 2017-07-23 DIAGNOSIS — Z79899 Other long term (current) drug therapy: Secondary | ICD-10-CM | POA: Diagnosis not present

## 2017-07-23 DIAGNOSIS — R519 Headache, unspecified: Secondary | ICD-10-CM

## 2017-07-23 MED ORDER — KETOROLAC TROMETHAMINE 60 MG/2ML IM SOLN
30.0000 mg | Freq: Once | INTRAMUSCULAR | Status: AC
  Administered 2017-07-23: 30 mg via INTRAMUSCULAR
  Filled 2017-07-23: qty 2

## 2017-07-23 MED ORDER — ACETAZOLAMIDE ER 500 MG PO CP12: 500.0000 mg | ORAL_CAPSULE | Freq: Two times a day (BID) | ORAL | 0 refills | Status: DC

## 2017-07-23 NOTE — ED Triage Notes (Signed)
Patient c/o bad headache for couple days. States she was supposed to have a spinal tap done but refused and was told she could come back at any time.

## 2017-07-23 NOTE — ED Provider Notes (Signed)
[place] DEPT Provider Note  CSN: [id number] Arrival date & time: 07/23/17 6948  Chief Complaint(s) Headache  HPI Julie King is a 26 y.o. female with a history of A. fib on metoprolol who is noncompliant with her medication here for daily headaches for several weeks.  Patient was seen 2 days ago for same presentation and ruled out for venous sinus thrombosis or intracranial mass with a CT venogram.  At that time there was concern for idiopathic intracranial hypertension and recommended a lumbar puncture which the patient declined.  Patient was referred to neurology for further workup and evaluation.  She reports that headaches persist and are minimally alleviated by Excedrin.  No aggravating factors.  She does report prior history of migraine headaches as a child but these do not feel similar to those headaches.  She is denying any current visual loss but does endorse intermittent blurry vision.  No focal deficits.  No recent fevers or infections.  Onset has been gradual and throbbing in nature.  In addition patient is also endorsing feeling like her heart jumps every few minutes.  She denies any chest pain, shortness of breath, lower extremity swelling.  HPI  Past Medical History Past Medical History:  Diagnosis Date  . Atrial fibrillation ([place])   . Hypertension   . Irritable bowel syndrome   . QT prolongation   . T wave inversion in EKG    There are no active problems to display for this patient.  Home Medication(s) Prior to Admission medications   Medication Sig Start Date End Date Taking? Authorizing Provider  acetaZOLAMIDE (DIAMOX SEQUELS) 500 MG capsule Take 1 capsule (500 mg total) by mouth 2 (two) times daily. 07/23/17 08/22/17  Fatima Blank, MD  albuterol (PROVENTIL HFA;VENTOLIN HFA) 108 (90 Base) MCG/ACT inhaler Inhale into the lungs every 6 (six) hours as needed for wheezing or shortness of breath.    [provider]    aspirin EC 325 MG tablet Take 1 tablet (325 mg total) by mouth daily. Patient not taking: Reported on 07/21/2017 04/21/17   Varney Biles, MD  Aspirin-Acetaminophen-Caffeine (EXCEDRIN PO) Take 2 tablets by mouth 2 (two) times daily as needed (migraine).     [provider]  diltiazem (CARDIZEM) 30 MG tablet Take 1 tablet every 4 hours AS NEEDED for AFIB rapid heart rate 04/25/17   Sherran Needs, NP  ibuprofen (ADVIL,MOTRIN) 800 MG tablet Take 1 tablet (800 mg total) by mouth every 8 (eight) hours as needed for moderate pain. Patient not taking: Reported on 07/21/2017 03/26/17   Milton Ferguson, MD  metoprolol tartrate (LOPRESSOR) 25 MG tablet Take 1 tablet (25 mg total) by mouth 2 (two) times daily. 04/21/17   Varney Biles, MD  traMADol (ULTRAM) 50 MG tablet Take 1 tablet (50 mg total) by mouth every 6 (six) hours as needed. Patient not taking: Reported on 06/20/2017 03/28/17 03/28/18  Julianne Handler, CNM  Past Surgical History Past Surgical History:  Procedure Laterality Date  . CESAREAN SECTION    . CHOLECYSTECTOMY    . HERNIA REPAIR    . TONSILLECTOMY     Family History Family History  Problem Relation Age of Onset  . Hypertension Other   . Diabetes Other     Social History Social History   Tobacco Use  . Smoking status: Current Every Day Smoker    Packs/day: 0.50    Types: Cigarettes  . Smokeless tobacco: Never Used  Substance Use Topics  . Alcohol use: Yes    Comment: rare  . Drug use: No   Allergies Percocet [oxycodone-acetaminophen]  Review of Systems Review of Systems All other systems are reviewed and are negative for acute change except as noted in the HPI  Physical Exam Vital Signs  I have reviewed the triage vital signs BP 126/77 (BP Location: Right Arm)   Pulse 80   Temp 98.5 F (36.9 C) (Oral)   Resp 17   LMP  07/03/2017 (Exact Date) Comment: neg hcg  SpO2 95%   Physical Exam  Constitutional: She is oriented to person, place, and time. She appears well-developed and well-nourished. No distress.  Obese  HENT:  Head: Normocephalic and atraumatic.  Nose: Nose normal.  Eyes: Conjunctivae and EOM are normal. Pupils are equal, round, and reactive to light. Right eye exhibits no discharge. Left eye exhibits no discharge. No scleral icterus.  Fundoscopic exam:      The right eye shows no papilledema.       The left eye shows no papilledema.  Neck: Normal range of motion. Neck supple.  Cardiovascular: Normal rate and regular rhythm. Exam reveals no gallop and no friction rub.  No murmur heard. Pulmonary/Chest: Effort normal and breath sounds normal. No stridor. No respiratory distress. She has no rales.  Abdominal: Soft. She exhibits no distension. There is no tenderness.  Musculoskeletal: She exhibits no edema or tenderness.  Neurological: She is alert and oriented to person, place, and time.  Mental Status:  Alert and oriented to person, place, and time.  Attention and concentration normal.  Speech clear.  Recent memory is intact  Cranial Nerves:  II Visual Fields: Intact to confrontation. Visual fields intact. III, IV, VI: Pupils equal and reactive to light and near. Full eye movement without nystagmus  V Facial Sensation: Normal. No weakness of masticatory muscles  VII: No facial weakness or asymmetry  VIII Auditory Acuity: Grossly normal  IX/X: The uvula is midline; the palate elevates symmetrically  XI: Normal sternocleidomastoid and trapezius strength  XII: The tongue is midline. No atrophy or fasciculations.   Motor System: Muscle Strength: 5/5 and symmetric in the upper and lower extremities. No pronation or drift.  Muscle Tone: Tone and muscle bulk are normal in the upper and lower extremities.   Reflexes: DTRs: 1+ and symmetrical in all four extremities. No Clonus Coordination:  Intact finger-to-nose, heel-to-shin. No tremor.  Sensation: Intact to light touch, and pinprick. Negative Romberg test.  Gait: Routine gait normal.   Skin: Skin is warm and dry. No rash noted. She is not diaphoretic. No erythema.  Psychiatric: She has a normal mood and affect.  Vitals reviewed.   ED Results and Treatments Labs (all labs ordered are listed, but only abnormal results are displayed) Labs Reviewed - No data to display  EKG  EKG Interpretation  Date/Time:  "Sunday July 23 2017 09:47:45 EST Ventricular Rate:  68 PR Interval:    QRS Duration: 98 QT Interval:  376 QTC Calculation: 400 R Axis:   70 Text Interpretation:  Sinus rhythm No significant change since last tracing Confirmed by Cardama, Pedro ([id number]) on 07/23/2017 9:57:12 AM      Radiology No results found. Pertinent labs & imaging results that were available during my care of the patient were reviewed by me and considered in my medical decision making (see chart for details).  Medications Ordered in ED Medications  ketorolac (TORADOL) injection 30 mg (30 mg Intramuscular Given 07/23/17 0941)                                                                                                                                    Procedures Procedures  (including critical care time)  Medical Decision Making / ED Course I have reviewed the nursing notes for this encounter and the patient's prior records (if available in EHR or on provided paperwork).    Patient here with daily persistent headache.  She is a young female in obese so idiopathic intracranial hypertension is a possibility.  Patient is declining LP if interventional radiology is not able to perform it.  Do not feel that this is an emergent reason to have interventional radiology come in given her lack of vision loss or neurologic  deficits.  Will start the patient on Diamox.  She already has follow-up with neurology.  Recommend she keep the appointment.  Given the patient's palpitations, EKG was performed which revealed normal sinus rhythm.  No PVCs or PACs noted however with pulse check I did note a skipped beat.  EKG nonischemic.  The patient appears reasonably screened and/or stabilized for discharge and I doubt any other medical condition or other EMC requiring further screening, evaluation, or treatment in the ED at this time prior to discharge.  The patient is safe for discharge with strict return precautions.   Final Clinical Impression(s) / ED Diagnoses Final diagnoses:  Daily headache    Disposition: Discharge  Condition: Good  I have discussed the results, Dx and Tx plan with the patient who expressed understanding and agree(s) with the plan. Discharge instructions discussed at great length. The patient was given strict return precautions who verbalized understanding of the instructions. No further questions at time of discharge.    ED Discharge Orders        Ordered    acetaZOLAMIDE (DIAMOX SEQUELS) 500 MG capsule  2 times daily     11" /25/18 0957       Follow Up: Neurology  Go to  As scheduled to follow-up for evaluation of idiopathic intracranial hypertension     This chart was dictated using voice recognition software.  Despite best efforts to proofread,  errors can occur which can change the documentation meaning.   Fatima Blank, MD  07/23/17 0958  

## 2017-07-25 ENCOUNTER — Ambulatory Visit: Payer: Medicaid Other | Admitting: Neurology

## 2017-07-25 ENCOUNTER — Encounter: Payer: Self-pay | Admitting: Neurology

## 2017-07-25 ENCOUNTER — Other Ambulatory Visit: Payer: Self-pay

## 2017-07-25 DIAGNOSIS — R51 Headache: Secondary | ICD-10-CM

## 2017-07-25 DIAGNOSIS — R519 Headache, unspecified: Secondary | ICD-10-CM

## 2017-07-25 DIAGNOSIS — G441 Vascular headache, not elsewhere classified: Secondary | ICD-10-CM | POA: Diagnosis not present

## 2017-07-25 HISTORY — DX: Headache, unspecified: R51.9

## 2017-07-25 HISTORY — DX: Morbid (severe) obesity due to excess calories: E66.01

## 2017-07-25 NOTE — Progress Notes (Signed)
Reason for visit: Headache  Referring physician:   [name] is a 26 y.o. female  History of present illness:  Julie King is a 26 year old right-handed black female with a history of atrial fibrillation, prolonged QT interval, and headache.  The patient began having headaches about 1 year ago, the headaches were initially were intermittent, but the headaches have become daily over the last 1 month.  The headaches are on the top of the head associated with a pressure sensation, unassociated with photophobia or phonophobia.  The patient does note pulsatile tinnitus, she denies any significant neck pain or neck stiffness with exception of some discomfort in the right side of her neck.  The patient has some forgetfulness, dizziness, and blurring of vision.  The patient may have discomfort behind the eyes.  She feels weak all over, she denies any numbness of the extremities or body.  The patient denies any balance issues or difficulty controlling the bowels of the bladder.  She will occasionally have some double vision.  She has been seen through the emergency room on several occasions, she had a CT scan of the brain in May 2018 for a headache lasting about 10 days.  The patient was in the emergency room on 23rd and on 23 July 2017.  A CT venogram of the head was unremarkable.  The patient did not wish to undergo a lumbar puncture.  Pseudotumor cerebri was suspected.  The patient was placed on Diamox 500 mg twice daily, she just started the medication yesterday.  She returns to this office for an evaluation.  She denies a family history of headache.  Past Medical History:  Diagnosis Date  . Atrial fibrillation ([place])   . Hypertension   . Irritable bowel syndrome   . QT prolongation   . T wave inversion in EKG     Past Surgical History:  Procedure Laterality Date  . CESAREAN SECTION    . CHOLECYSTECTOMY    . HERNIA REPAIR    . TONSILLECTOMY      Family History  Problem  Relation Age of Onset  . Hypertension Other   . Diabetes Other     Social history:  reports that she has been smoking cigarettes.  She has been smoking about 0.50 packs per day. she has never used smokeless tobacco. She reports that she drinks alcohol. She reports that she does not use drugs.  Medications:  Prior to Admission medications   Medication Sig Start Date End Date Taking? Authorizing Provider  acetaZOLAMIDE (DIAMOX SEQUELS) 500 MG capsule Take 1 capsule (500 mg total) by mouth 2 (two) times daily. 07/23/17 08/22/17 Yes Cardama, Grayce Sessions, MD  albuterol (PROVENTIL HFA;VENTOLIN HFA) 108 (90 Base) MCG/ACT inhaler Inhale into the lungs every 6 (six) hours as needed for wheezing or shortness of breath.   Yes [provider]  aspirin EC 325 MG tablet Take 1 tablet (325 mg total) by mouth daily. 04/21/17  Yes Varney Biles, MD  Aspirin-Acetaminophen-Caffeine (EXCEDRIN PO) Take 2 tablets by mouth 2 (two) times daily as needed (migraine).    Yes [provider]  diltiazem (CARDIZEM) 30 MG tablet Take 1 tablet every 4 hours AS NEEDED for AFIB rapid heart rate 04/25/17  Yes Sherran Needs, NP  ibuprofen (ADVIL,MOTRIN) 800 MG tablet Take 1 tablet (800 mg total) by mouth every 8 (eight) hours as needed for moderate pain. 03/26/17  Yes Milton Ferguson, MD  metoprolol tartrate (LOPRESSOR) 25 MG tablet Take 1 tablet (25  mg total) by mouth 2 (two) times daily. 04/21/17  Yes Nanavati, Ankit, MD  traMADol (ULTRAM) 50 MG tablet Take 1 tablet (50 mg total) by mouth every 6 (six) hours as needed. 03/28/17 03/28/18 Yes Julianne Handler, CNM      Allergies  Allergen Reactions  . Percocet [Oxycodone-Acetaminophen] Hives and Itching    ROS:  Out of a complete 14 system review of symptoms, the patient complains only of the following symptoms, and all other reviewed systems are negative.  Weight gain, fatigue Chest pain, palpitations of the heart Ringing in the ears Moles Blurred  vision, eye pain Shortness of breath Anemia, lymph node enlargement Increased thirst Memory loss, confusion, headache, dizziness, tremor  Blood pressure (!) 145/86, pulse 99, height 5\' 7"  (1.702 m), weight 298 lb 8 oz (135.4 kg), last menstrual period 07/03/2017.  Physical Exam  General: The patient is alert and cooperative at the time of the examination.  The patient is morbidly obese.  Eyes: Pupils are equal, round, and reactive to light. Discs are flat bilaterally.  Venous pulsations were not observed.  Neck: The neck is supple, no carotid bruits are noted.  Respiratory: The respiratory examination is clear.  Cardiovascular: The cardiovascular examination reveals a regular rate and rhythm, no obvious murmurs or rubs are noted.   Neuromuscular: Range of movement of the cervical spine was full, no crepitus is noted in the temporomandibular joints.  Skin: Extremities are without significant edema.  Neurologic Exam  Mental status: The patient is alert and oriented x 3 at the time of the examination. The patient has apparent normal recent and remote memory, with an apparently normal attention span and concentration ability.  Cranial nerves: Facial symmetry is present. There is good sensation of the face to pinprick and soft touch bilaterally. The strength of the facial muscles and the muscles to head turning and shoulder shrug are normal bilaterally. Speech is well enunciated, no aphasia or dysarthria is noted. Extraocular movements are full. Visual fields are full. The tongue is midline, and the patient has symmetric elevation of the soft palate. No obvious hearing deficits are noted.  Motor: The motor testing reveals 5 over 5 strength of all 4 extremities. Good symmetric motor tone is noted throughout.  Sensory: Sensory testing is intact to pinprick, soft touch, vibration sensation, and position sense on all 4 extremities. No evidence of extinction is noted.  Coordination:  Cerebellar testing reveals good finger-nose-finger and heel-to-shin bilaterally.  Gait and station: Gait is normal. Tandem gait is normal. Romberg is negative. No drift is seen.  Reflexes: Deep tendon reflexes are symmetric and normal bilaterally. Toes are downgoing bilaterally.   CT head 01/22/17:  IMPRESSION: Normal head CT.   CT venogram head 07/21/17:  IMPRESSION: 1. Patent dural venous sinuses without evidence of thrombus. 2. Unremarkable CT appearance of the brain. No evidence of acute intracranial abnormality.  Assessment/Plan:  1.  Daily headache  2.  Morbid obesity  The patient has had headaches off and on for the last year, she now has daily headaches over the last month.  The clinical examination does not show evidence of papilledema, but the patient does report pulsatile tinnitus with her headache.  The patient will undergo MRI of the brain, we will consider lumbar puncture if this is unremarkable.  The patient is on Diamox at this time.  She will follow-up in 3 months.  Jill Alexanders MD 07/25/2017 8:54 AM  [facility] [address] [place], [place] [address]  Phone [phone] Fax [phone]  

## 2017-07-25 NOTE — Patient Instructions (Signed)
    We will check MRI of the brain. 

## 2017-07-26 ENCOUNTER — Telehealth: Payer: Self-pay | Admitting: Neurology

## 2017-07-26 ENCOUNTER — Emergency Department (HOSPITAL_COMMUNITY)
Admission: EM | Admit: 2017-07-26 | Discharge: 2017-07-26 | Disposition: A | Discharge status: Home / Self Care | Payer: Medicaid Other | Attending: Emergency Medicine | Admitting: Emergency Medicine

## 2017-07-26 ENCOUNTER — Encounter (HOSPITAL_COMMUNITY): Payer: Self-pay | Admitting: Emergency Medicine

## 2017-07-26 DIAGNOSIS — R55 Syncope and collapse: Secondary | ICD-10-CM | POA: Insufficient documentation

## 2017-07-26 DIAGNOSIS — Z5321 Procedure and treatment not carried out due to patient leaving prior to being seen by health care provider: Secondary | ICD-10-CM | POA: Insufficient documentation

## 2017-07-26 LAB — CBC
HCT: 36.6 % (ref 36.0–46.0)
Hemoglobin: 12.1 g/dL (ref 12.0–15.0)
MCH: 20 pg — AB (ref 26.0–34.0)
MCHC: 33.1 g/dL (ref 30.0–36.0)
MCV: 60.4 fL — AB (ref 78.0–100.0)
PLATELETS: 207 10*3/uL (ref 150–400)
RBC: 6.06 MIL/uL — AB (ref 3.87–5.11)
RDW: 16.5 % — ABNORMAL HIGH (ref 11.5–15.5)
WBC: 14.2 10*3/uL — ABNORMAL HIGH (ref 4.0–10.5)

## 2017-07-26 LAB — I-STAT BETA HCG BLOOD, ED (MC, WL, AP ONLY): I-stat hCG, quantitative: <5 m[IU]/mL (ref <5)

## 2017-07-26 LAB — BASIC METABOLIC PANEL
Anion gap: 8 (ref 5–15)
BUN: 15 mg/dL (ref 6–20)
CHLORIDE: 110 mmol/L (ref 101–111)
CO2: 19 mmol/L — AB (ref 22–32)
CREATININE: 0.76 mg/dL (ref 0.44–1.00)
Calcium: 9.3 mg/dL (ref 8.9–10.3)
GFR calc non Af Amer: >60 mL/min (ref >60)
Glucose, Bld: 102 mg/dL — ABNORMAL HIGH (ref 65–99)
Potassium: 3.8 mmol/L (ref 3.5–5.1)
Sodium: 137 mmol/L (ref 135–145)

## 2017-07-26 LAB — CBG MONITORING, ED: Glucose-Capillary: 115 mg/dL — ABNORMAL HIGH (ref 65–99)

## 2017-07-26 NOTE — ED Triage Notes (Signed)
patient reports that she remembers walking out of kitchen being dizzy and lightheaded then next thing she knows she woke up on the floor.  Patient sees neurologist for intracranial pressure and was told when called them to go to ED. Patient reports headache now with intermittent blurred vision, nausea without vomiting.

## 2017-07-26 NOTE — Telephone Encounter (Signed)
I called the patient.  The patient had an episode of dizziness and may have had a fainting event.  It is possible that the Diamox that she is on may have been part of the reason for this.  If she continues to have dizziness, we may need to stop the medication, MRI of the brain has been ordered and we may get a spinal tap after that.  The clinical examination did not show evidence of papilledema.

## 2017-07-26 NOTE — Telephone Encounter (Signed)
Pt called a moment ago asking if there were an RN available to speak with to determine if she should go to the hospital because she woke up on the floor and has been suffering from head aches.  Pt was advised to utilize her ED and that a message would be forwarded to the RN for Dr Jannifer Franklin, pt said she would be in route to the hospital shortly

## 2017-07-31 ENCOUNTER — Telehealth: Payer: Self-pay | Admitting: Neurology

## 2017-07-31 ENCOUNTER — Telehealth: Payer: Self-pay | Admitting: *Deleted

## 2017-07-31 NOTE — Telephone Encounter (Signed)
Reached out to patient to inform her that Per the pre-cert dept. her insurance [employer] will not pay for her in lab sleep study. Patient said she understands and that she will call back when she gets other insurance to schedule another study.

## 2017-07-31 NOTE — Telephone Encounter (Signed)
Patient called in this evening stating that she still has daily headache, and top of the head, pressure-like headache, some relief with hand pressure on the top of her head, intermittent Nausea, no vomiting, no photophobia, no phonophobia.  Excedrin and ibuprofen helped in the past, but now not helping anymore.  Patient also complains of forgetfulness, lightheadedness, blurred vision, tingling at the top of the head, sometimes mouth twitching.  She was recently took off Diamox 500 mg twice daily 5 days ago due to episode of dizziness and possible LOC.  However, she noticed some worsening of headache after taking off Diamox, and her headache seems got better after starting Diamox.    However, she found out today that her Medicaid insurance has been expired, and she has no insurance coverage for MRI or LP as previously scheduled by Dr. Jannifer Franklin.  She also has no money to buy medication at pharmacy at this moment.  I recommended her to restart Diamox on a low-dose 250 mg twice daily for headache.  Okay to take 500 mg tonight for first initial dose.  I asked patient to call office and discussed with Dr. Jannifer Franklin in a.m. regarding further headache workup and medication management.  She expressed understanding and appreciation.  Rosalin Hawking, MD PhD Stroke Neurology 07/31/2017 10:15 PM

## 2017-08-01 MED ORDER — TOPIRAMATE 25 MG PO TABS: ORAL_TABLET | ORAL | 3 refills | Status: DC

## 2017-08-01 NOTE — Telephone Encounter (Signed)
Called and spoke with pt. She would like me to cx rx sent to [facility] and resend to [facility]. She will print goodrx.com coupoun for rx topiramate 25mg  (90tabs) and bring to [facility] for cost less than 10 dollars per website.  Called and spoke with Dalend at [facility] and he will cx rx sent there.

## 2017-08-01 NOTE — Telephone Encounter (Signed)
I called the patient.  The patient no longer has any insurance, she indicates that she cannot afford the MRI, lumbar puncture, and cannot afford medication.  I will try to switch her to generic Topamax which may be cheaper than Diamox.  We will see if we can find other ways to get the medication cheaper.

## 2017-08-01 NOTE — Addendum Note (Signed)
Addended by: Rossie Muskrat L on: 08/01/2017 09:09 AM   Modules accepted: Orders

## 2017-08-01 NOTE — Addendum Note (Signed)
Addended by: Kathrynn Ducking on: 08/01/2017 08:30 AM   Modules accepted: Orders

## 2017-08-03 ENCOUNTER — Emergency Department (HOSPITAL_COMMUNITY)
Admission: EM | Admit: 2017-08-03 | Discharge: 2017-08-03 | Disposition: A | Discharge status: Home / Self Care | Payer: Medicaid Other | Attending: Emergency Medicine | Admitting: Emergency Medicine

## 2017-08-03 ENCOUNTER — Other Ambulatory Visit: Payer: Self-pay

## 2017-08-03 ENCOUNTER — Encounter (HOSPITAL_COMMUNITY): Payer: Self-pay | Admitting: Nurse Practitioner

## 2017-08-03 DIAGNOSIS — F17210 Nicotine dependence, cigarettes, uncomplicated: Secondary | ICD-10-CM | POA: Diagnosis not present

## 2017-08-03 DIAGNOSIS — I10 Essential (primary) hypertension: Secondary | ICD-10-CM | POA: Insufficient documentation

## 2017-08-03 DIAGNOSIS — Z79899 Other long term (current) drug therapy: Secondary | ICD-10-CM | POA: Diagnosis not present

## 2017-08-03 DIAGNOSIS — I480 Paroxysmal atrial fibrillation: Secondary | ICD-10-CM | POA: Diagnosis not present

## 2017-08-03 LAB — CBC
HEMATOCRIT: 34.2 % — AB (ref 36.0–46.0)
Hemoglobin: 11 g/dL — ABNORMAL LOW (ref 12.0–15.0)
MCH: 19.6 pg — AB (ref 26.0–34.0)
MCHC: 32.2 g/dL (ref 30.0–36.0)
MCV: 60.9 fL — AB (ref 78.0–100.0)
Platelets: 218 10*3/uL (ref 150–400)
RBC: 5.62 MIL/uL — AB (ref 3.87–5.11)
RDW: 16 % — ABNORMAL HIGH (ref 11.5–15.5)
WBC: 10.9 10*3/uL — ABNORMAL HIGH (ref 4.0–10.5)

## 2017-08-03 LAB — BASIC METABOLIC PANEL
Anion gap: 6 (ref 5–15)
BUN: 9 mg/dL (ref 6–20)
CHLORIDE: 107 mmol/L (ref 101–111)
CO2: 27 mmol/L (ref 22–32)
Calcium: 9.1 mg/dL (ref 8.9–10.3)
Creatinine, Ser: 0.68 mg/dL (ref 0.44–1.00)
GFR calc non Af Amer: >60 mL/min (ref >60)
Glucose, Bld: 94 mg/dL (ref 65–99)
POTASSIUM: 4.1 mmol/L (ref 3.5–5.1)
SODIUM: 140 mmol/L (ref 135–145)

## 2017-08-03 LAB — I-STAT BETA HCG BLOOD, ED (MC, WL, AP ONLY): I-stat hCG, quantitative: <5 m[IU]/mL (ref <5)

## 2017-08-03 LAB — I-STAT TROPONIN, ED: TROPONIN I, POC: 0 ng/mL (ref 0.00–0.08)

## 2017-08-03 MED ORDER — METOPROLOL TARTRATE 25 MG PO TABS
12.5000 mg | ORAL_TABLET | Freq: Once | ORAL | Status: AC
  Administered 2017-08-03: 12.5 mg via ORAL
  Filled 2017-08-03: qty 1

## 2017-08-03 NOTE — ED Notes (Signed)
Patient is being discharged home. Discharge instructions were given to the patient

## 2017-08-03 NOTE — Discharge Instructions (Addendum)
Take 12.5 mg (half of your current dose) metoprolol twice daily. Continue Cardizem as needed.  It is very important that you follow up with the cardiologist! I have sent a referral over. If you do not hear from them tomorrow, give the clinic a call to inquire about appointment.   Return to ER for new or worsening symptoms, any additional concerns.

## 2017-08-03 NOTE — ED Triage Notes (Signed)
Patient reports she is still feeling the heart palpitations and it has been 2 days. She was seen here on several occassions per patient and given the dx of a-fib. Per patient she was referred to the a-fib clinic however she has to renew her medicaid and have not contacted the clinic as she knew her medicaid was not active. She reports her medicaid is in the process of being renewed. Denies any shortness of breath or dizziness. Main concern is still feeling the palpitations. Patient is able to speak in complete sentences and is ambulatory to the room from triage without difficulty.

## 2017-08-03 NOTE — ED Provider Notes (Signed)
[facility] DEPT Provider Note   CSN: [id number] Arrival date & time: 08/03/17  4268     History   Chief Complaint Chief Complaint  Patient presents with  . Atrial Fibrillation    HPI Julie King is a 26 y.o. female.  The history is provided by the patient and medical records. No language interpreter was used.  Atrial Fibrillation  Associated symptoms include chest pain (Heaviness).   Julie King is a 26 y.o. female  with a PMH of atrial fibrillation who presents to the Emergency Department complaining of intermittent palpitations x 2 days. She was seen by a. Fib clinic shortly after diagnosis in August and started on Metoprolol BID and cardizem PRN. She states that she has been trying to take metoprolol, however when she takes it, she feels very tired and weak, therefore, she has not been taking it twice daily as she should, but does take it as least once daily, sometimes twice. She states that he BP has been running 120's/70's before taking medication. She endorses chest heaviness, denies shortness of breath. She states that she has been unable to schedule follow up appointment with cardiology as she is in the process of her medicaid being renewed, but she has been in touch with them and is working on Print production planner sorted out.    Past Medical History:  Diagnosis Date  . Atrial fibrillation ([place])   . Headache 07/25/2017  . Hypertension   . Irritable bowel syndrome   . Morbidly obese ([place]) 07/25/2017  . QT prolongation   . T wave inversion in EKG     Patient Active Problem List   Diagnosis Date Noted  . Headache 07/25/2017  . Morbidly obese ([place]) 07/25/2017    Past Surgical History:  Procedure Laterality Date  . CESAREAN SECTION    . CHOLECYSTECTOMY    . HERNIA REPAIR    . TONSILLECTOMY      OB History    Gravida Para Term Preterm AB Living   1 1 1     1    SAB TAB Ectopic Multiple Live Births           1       Home  Medications    Prior to Admission medications   Medication Sig Start Date End Date Taking? Authorizing Provider  albuterol (PROVENTIL HFA;VENTOLIN HFA) 108 (90 Base) MCG/ACT inhaler Inhale into the lungs every 6 (six) hours as needed for wheezing or shortness of breath.   Yes [provider]  aspirin EC 325 MG tablet Take 1 tablet (325 mg total) by mouth daily. 04/21/17  Yes Varney Biles, MD  aspirin-acetaminophen-caffeine (EXCEDRIN EXTRA STRENGTH) [phone] MG tablet Take 2 tablets by mouth every 4 (four) hours as needed for headache or migraine.   Yes [provider]  diltiazem (CARDIZEM) 30 MG tablet Take 1 tablet every 4 hours AS NEEDED for AFIB rapid heart rate 04/25/17  Yes Sherran Needs, NP  metoprolol tartrate (LOPRESSOR) 25 MG tablet Take 1 tablet (25 mg total) by mouth 2 (two) times daily. Patient taking differently: Take 25 mg by mouth daily.  04/21/17  Yes Varney Biles, MD  acetaZOLAMIDE (DIAMOX SEQUELS) 500 MG capsule Take 1 capsule (500 mg total) by mouth 2 (two) times daily. Patient not taking: Reported on 08/03/2017 07/23/17 08/22/17  Fatima Blank, MD  ibuprofen (ADVIL,MOTRIN) 800 MG tablet Take 1 tablet (800 mg total) by mouth every 8 (eight) hours as needed for moderate pain. Patient not taking:  Reported on 08/03/2017 03/26/17   Milton Ferguson, MD  topiramate (TOPAMAX) 25 MG tablet Take one tablet at night for one week, then take 2 tablets at night for one week, then take 3 tablets at night. Patient not taking: Reported on 08/03/2017 08/01/17   Kathrynn Ducking, MD  traMADol (ULTRAM) 50 MG tablet Take 1 tablet (50 mg total) by mouth every 6 (six) hours as needed. Patient not taking: Reported on 08/03/2017 03/28/17 03/28/18  Julianne Handler, CNM    Family History Family History  Problem Relation Age of Onset  . Hypertension Other   . Diabetes Other   . Crohn's disease Mother   . Hypertension Mother   . Thyroid disease Father   . Diabetes  Father     Social History Social History   Tobacco Use  . Smoking status: Current Every Day Smoker    Packs/day: 0.50    Types: Cigarettes  . Smokeless tobacco: Never Used  Substance Use Topics  . Alcohol use: Yes    Comment: rare  . Drug use: No     Allergies   Percocet [oxycodone-acetaminophen]   Review of Systems Review of Systems  Cardiovascular: Positive for chest pain (Heaviness) and palpitations. Negative for leg swelling.  All other systems reviewed and are negative.    Physical Exam Updated Vital Signs BP 127/82 (BP Location: Left Arm)   Pulse 74   Temp 98.5 F (36.9 C) (Oral)   Resp (!) 22   Ht 5\' 7"  (1.702 m)   Wt 135.2 kg (298 lb)   SpO2 100%   BMI 46.67 kg/m   Physical Exam  Constitutional: She is oriented to person, place, and time. She appears well-developed and well-nourished. No distress.  HENT:  Head: Normocephalic and atraumatic.  Cardiovascular: Normal heart sounds.  No murmur heard. Regular rate and rhythm on exam.  Pulmonary/Chest: Effort normal and breath sounds normal. No stridor. No respiratory distress. She has no wheezes. She has no rales.  Abdominal: Soft. She exhibits no distension. There is no tenderness.  Musculoskeletal: She exhibits no edema.  Neurological: She is alert and oriented to person, place, and time.  Skin: Skin is warm and dry.  Nursing note and vitals reviewed.    ED Treatments / Results  Labs (all labs ordered are listed, but only abnormal results are displayed) Labs Reviewed  CBC - Abnormal; Notable for the following components:      Result Value   WBC 10.9 (*)    RBC 5.62 (*)    Hemoglobin 11.0 (*)    HCT 34.2 (*)    MCV 60.9 (*)    MCH 19.6 (*)    RDW 16.0 (*)    All other components within normal limits  BASIC METABOLIC PANEL  I-STAT BETA HCG BLOOD, ED ([facility], [facility], [facility] ONLY)  I-STAT TROPONIN, ED    EKG  EKG Interpretation  Date/Time:  Thursday August 03 2017 07:57:26 EST Ventricular Rate:    81 PR Interval:    QRS Duration: 96 QT Interval:  371 QTC Calculation: 431 R Axis:   48 Text Interpretation:  Sinus rhythm Borderline T abnormalities, anterior leads No acute changes Confirmed by Jola Schmidt [phone]) on 08/03/2017 8:54:48 AM       Radiology No results found.  Procedures Procedures (including critical care time)  Medications Ordered in ED Medications  metoprolol tartrate (LOPRESSOR) tablet 12.5 mg (12.5 mg Oral Given 08/03/17 0930)     Initial Impression / Assessment and Plan / ED Course  I have reviewed the triage vital signs and the nursing notes.  Pertinent labs & imaging results that were available during my care of the patient were reviewed by me and considered in my medical decision making (see chart for details).    Julie King is a 26 y.o. female who presents to ED for palpitations x 2 days. Hx of a.fib recently diagnosed in August and had one follow up visit with cardiology clinic where she was started on 25 mg metoprolol BID and Cardizem PRN. Trop negative. Labs reassuring. EKG in NSR today. RRR on exam. Intermittently will have palpitations which correlate with ectopy noted on tele strip. Patient notes that she has had trouble with medication compliance. Her metoprolol has made her feel very weak and tired, therefore she has not been taking it BID. Will try to decrease dose to 12.5 mg BID to aid with this and continue Cardizem PRN. Patient in difficult position as she ultimately needs cardiology outpatient follow up for these issues, however recently lost her medicaid. Will place another referral to a.fib clinic and discussed the importance of follow up with patient at length. Evaluation does not show pathology that would require ongoing emergent intervention or inpatient treatment. Return precautions discussed and all questions answered.  Patient discussed with Dr. Venora Maples who agrees with treatment plan.   Final Clinical Impressions(s) / ED Diagnoses    Final diagnoses:  Paroxysmal atrial fibrillation [facility])    ED Discharge Orders        Ordered    Amb Referral to AFIB Clinic    Comments:  Ectopy and palpitations in ED today. Hx of a.fib. Possible medication management.   08/03/17 [place], Ozella Almond, PA-C 08/03/17 1055    Jola Schmidt, MD 08/03/17 [phone]

## 2017-08-05 ENCOUNTER — Ambulatory Visit
Admit: 2017-08-05 | Discharge: 2017-08-05 | Disposition: A | Discharge status: Home / Self Care | Payer: Medicaid Other | Attending: Neurology | Admitting: Neurology

## 2017-08-05 DIAGNOSIS — G441 Vascular headache, not elsewhere classified: Secondary | ICD-10-CM | POA: Diagnosis not present

## 2017-08-05 MED ORDER — GADOBENATE DIMEGLUMINE 529 MG/ML IV SOLN: 20.0000 mL | Freq: Once | INTRAVENOUS | Status: DC | PRN

## 2017-08-06 ENCOUNTER — Telehealth: Payer: Self-pay | Admitting: Neurology

## 2017-08-06 DIAGNOSIS — G4489 Other headache syndrome: Secondary | ICD-10-CM

## 2017-08-06 NOTE — Telephone Encounter (Signed)
  I called the patient.  MRI of the brain is unremarkable.  I will schedule the lumbar puncture as we discussed in the office visit.  The lumbar puncture will determine whether or not she has pseudotumor cerebri.  MRI brain 08/05/17:  IMPRESSION: Unremarkable MRI brain w/wo contrast. Incidental low lying cerebellar tonsils with slight crowding of craniovertebral junction noted.

## 2017-08-07 ENCOUNTER — Encounter (HOSPITAL_BASED_OUTPATIENT_CLINIC_OR_DEPARTMENT_OTHER): Payer: Managed Care, Other (non HMO)

## 2017-08-10 ENCOUNTER — Telehealth: Payer: Self-pay | Admitting: Neurology

## 2017-08-10 NOTE — Telephone Encounter (Signed)
I called the patient.  The MRI of the brain does show some slightly low-lying cerebellar tonsils, this is not likely to be clinically significant, likely not the cause of her headaches.

## 2017-08-10 NOTE — Telephone Encounter (Signed)
Pt is asking for a call back to discuss the results of her MRI from my chart, please call

## 2017-08-17 NOTE — Telephone Encounter (Signed)
error 

## 2017-08-25 ENCOUNTER — Inpatient Hospital Stay
Admission: RE | Admit: 2017-08-25 | Discharge: 2017-08-25 | Disposition: A | Discharge status: Home / Self Care | Payer: Medicaid Other | Source: Ambulatory Visit | Attending: Neurology | Admitting: Neurology

## 2017-08-25 NOTE — Discharge Instructions (Signed)

## 2017-09-01 ENCOUNTER — Other Ambulatory Visit (HOSPITAL_COMMUNITY): Payer: Self-pay | Admitting: *Deleted

## 2017-09-01 MED ORDER — METOPROLOL TARTRATE 25 MG PO TABS: 12.5000 mg | ORAL_TABLET | Freq: Two times a day (BID) | ORAL | 3 refills | Status: DC

## 2017-09-23 ENCOUNTER — Emergency Department (HOSPITAL_COMMUNITY): Payer: Medicaid Other

## 2017-09-23 ENCOUNTER — Encounter (HOSPITAL_COMMUNITY): Payer: Self-pay | Admitting: Emergency Medicine

## 2017-09-23 ENCOUNTER — Emergency Department (HOSPITAL_COMMUNITY)
Admission: EM | Admit: 2017-09-23 | Discharge: 2017-09-23 | Disposition: A | Discharge status: Home / Self Care | Payer: Medicaid Other | Attending: Emergency Medicine | Admitting: Emergency Medicine

## 2017-09-23 DIAGNOSIS — Z7982 Long term (current) use of aspirin: Secondary | ICD-10-CM | POA: Insufficient documentation

## 2017-09-23 DIAGNOSIS — J069 Acute upper respiratory infection, unspecified: Secondary | ICD-10-CM | POA: Insufficient documentation

## 2017-09-23 DIAGNOSIS — Z79899 Other long term (current) drug therapy: Secondary | ICD-10-CM | POA: Insufficient documentation

## 2017-09-23 DIAGNOSIS — I10 Essential (primary) hypertension: Secondary | ICD-10-CM | POA: Insufficient documentation

## 2017-09-23 DIAGNOSIS — F17210 Nicotine dependence, cigarettes, uncomplicated: Secondary | ICD-10-CM | POA: Insufficient documentation

## 2017-09-23 DIAGNOSIS — B9789 Other viral agents as the cause of diseases classified elsewhere: Secondary | ICD-10-CM | POA: Insufficient documentation

## 2017-09-23 NOTE — ED Triage Notes (Signed)
Patient c/o cough that is dry, congestion, and body aches since Monday. 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 Took Theraflu, and other OTC cold meds that arent helping.

## 2017-09-23 NOTE — ED Provider Notes (Signed)
[facility] DEPT Provider Note   CSN: [id number] Arrival date & time: 09/23/17  7124     History   Chief Complaint Chief Complaint  Patient presents with  . Cough  . Generalized Body Aches    HPI Julie King is a 27 y.o. female.  27 year old female presents with 3 days of URI symptoms consisting of nonproductive cough and congestion with mild sore throat as well as bilateral ear fullness.  Denies any fever or chills.  No vomiting or diarrhea.  Has been using over-the-counter medications without relief.  Does have positive sick exposures.  Nothing makes her symptoms better      Past Medical History:  Diagnosis Date  . Atrial fibrillation ([place])   . Headache 07/25/2017  . Hypertension   . Irritable bowel syndrome   . Morbidly obese ([place]) 07/25/2017  . QT prolongation   . T wave inversion in EKG     Patient Active Problem List   Diagnosis Date Noted  . Headache 07/25/2017  . Morbidly obese ([place]) 07/25/2017    Past Surgical History:  Procedure Laterality Date  . CESAREAN SECTION    . CHOLECYSTECTOMY    . HERNIA REPAIR    . TONSILLECTOMY      OB History    Gravida Para Term Preterm AB Living   1 1 1     1    SAB TAB Ectopic Multiple Live Births           1       Home Medications    Prior to Admission medications   Medication Sig Start Date End Date Taking? Authorizing Provider  acetaZOLAMIDE (DIAMOX SEQUELS) 500 MG capsule Take 1 capsule (500 mg total) by mouth 2 (two) times daily. Patient not taking: Reported on 08/03/2017 07/23/17 08/22/17  Fatima Blank, MD  albuterol (PROVENTIL HFA;VENTOLIN HFA) 108 (90 Base) MCG/ACT inhaler Inhale into the lungs every 6 (six) hours as needed for wheezing or shortness of breath.    [provider]  aspirin EC 325 MG tablet Take 1 tablet (325 mg total) by mouth daily. 04/21/17   Varney Biles, MD  aspirin-acetaminophen-caffeine (EXCEDRIN EXTRA STRENGTH) [phone] MG  tablet Take 2 tablets by mouth every 4 (four) hours as needed for headache or migraine.    [provider]  diltiazem (CARDIZEM) 30 MG tablet Take 1 tablet every 4 hours AS NEEDED for AFIB rapid heart rate 04/25/17   Sherran Needs, NP  ibuprofen (ADVIL,MOTRIN) 800 MG tablet Take 1 tablet (800 mg total) by mouth every 8 (eight) hours as needed for moderate pain. Patient not taking: Reported on 08/03/2017 03/26/17   Milton Ferguson, MD  metoprolol tartrate (LOPRESSOR) 25 MG tablet Take 0.5 tablets (12.5 mg total) by mouth 2 (two) times daily. 09/01/17   Sherran Needs, NP  topiramate (TOPAMAX) 25 MG tablet Take one tablet at night for one week, then take 2 tablets at night for one week, then take 3 tablets at night. Patient not taking: Reported on 08/03/2017 08/01/17   Kathrynn Ducking, MD  traMADol (ULTRAM) 50 MG tablet Take 1 tablet (50 mg total) by mouth every 6 (six) hours as needed. Patient not taking: Reported on 08/03/2017 03/28/17 03/28/18  Julianne Handler, CNM    Family History Family History  Problem Relation Age of Onset  . Hypertension Other   . Diabetes Other   . Crohn's disease Mother   . Hypertension Mother   . Thyroid disease Father   .  Diabetes Father     Social History Social History   Tobacco Use  . Smoking status: Current Every Day Smoker    Packs/day: 0.50    Types: Cigarettes  . Smokeless tobacco: Never Used  Substance Use Topics  . Alcohol use: Yes    Comment: rare  . Drug use: No     Allergies   Percocet [oxycodone-acetaminophen]   Review of Systems Review of Systems  All other systems reviewed and are negative.    Physical Exam Updated Vital Signs BP (!) 142/85 (BP Location: Left Arm)   Pulse 90   Temp 98.7 F (37.1 C) (Oral)   Resp 18   LMP 09/01/2017   SpO2 100%   Physical Exam  Constitutional: She is oriented to person, place, and time. She appears well-developed and well-nourished.  Non-toxic appearance. No distress.  HENT:    Head: Normocephalic and atraumatic.  Eyes: Conjunctivae, EOM and lids are normal. Pupils are equal, round, and reactive to light.  Neck: Normal range of motion. Neck supple. No tracheal deviation present. No thyroid mass present.  Cardiovascular: Normal rate, regular rhythm and normal heart sounds. Exam reveals no gallop.  No murmur heard. Pulmonary/Chest: Effort normal and breath sounds normal. No stridor. No respiratory distress. She has no decreased breath sounds. She has no wheezes. She has no rhonchi. She has no rales.  Abdominal: Soft. Normal appearance and bowel sounds are normal. She exhibits no distension. There is no tenderness. There is no rebound and no CVA tenderness.  Musculoskeletal: Normal range of motion. She exhibits no edema or tenderness.  Neurological: She is alert and oriented to person, place, and time. She has normal strength. No cranial nerve deficit or sensory deficit. GCS eye subscore is 4. GCS verbal subscore is 5. GCS motor subscore is 6.  Skin: Skin is warm and dry. No abrasion and no rash noted.  Psychiatric: She has a normal mood and affect. Her speech is normal and behavior is normal.  Nursing note and vitals reviewed.    ED Treatments / Results  Labs (all labs ordered are listed, but only abnormal results are displayed) Labs Reviewed - No data to display  EKG  EKG Interpretation None       Radiology No results found.  Procedures Procedures (including critical care time)  Medications Ordered in ED Medications - No data to display   Initial Impression / Assessment and Plan / ED Course  I have reviewed the triage vital signs and the nursing notes.  Pertinent labs & imaging results that were available during my care of the patient were reviewed by me and considered in my medical decision making (see chart for details).     Patient's chest x-ray per radiology without acute infiltrates.  Suspect URI and return precautions given  Final  Clinical Impressions(s) / ED Diagnoses   Final diagnoses:  None    ED Discharge Orders    None       Lacretia Leigh, MD 09/23/17 [phone]

## 2017-09-23 NOTE — Discharge Instructions (Signed)
Your chest x-ray today was negative.  Continue with over-the-counter medications and return here for any problems

## 2017-10-03 ENCOUNTER — Other Ambulatory Visit: Payer: Self-pay

## 2017-10-03 ENCOUNTER — Emergency Department (HOSPITAL_COMMUNITY)
Admission: EM | Admit: 2017-10-03 | Discharge: 2017-10-03 | Disposition: A | Discharge status: Home / Self Care | Payer: Self-pay | Attending: Emergency Medicine | Admitting: Emergency Medicine

## 2017-10-03 ENCOUNTER — Encounter (HOSPITAL_COMMUNITY): Payer: Self-pay | Admitting: Emergency Medicine

## 2017-10-03 ENCOUNTER — Emergency Department (HOSPITAL_COMMUNITY): Payer: Self-pay

## 2017-10-03 DIAGNOSIS — J069 Acute upper respiratory infection, unspecified: Secondary | ICD-10-CM | POA: Insufficient documentation

## 2017-10-03 DIAGNOSIS — Z7982 Long term (current) use of aspirin: Secondary | ICD-10-CM | POA: Insufficient documentation

## 2017-10-03 DIAGNOSIS — F17210 Nicotine dependence, cigarettes, uncomplicated: Secondary | ICD-10-CM | POA: Insufficient documentation

## 2017-10-03 DIAGNOSIS — B9789 Other viral agents as the cause of diseases classified elsewhere: Secondary | ICD-10-CM | POA: Insufficient documentation

## 2017-10-03 DIAGNOSIS — Z79899 Other long term (current) drug therapy: Secondary | ICD-10-CM | POA: Insufficient documentation

## 2017-10-03 DIAGNOSIS — I10 Essential (primary) hypertension: Secondary | ICD-10-CM | POA: Insufficient documentation

## 2017-10-03 DIAGNOSIS — R079 Chest pain, unspecified: Secondary | ICD-10-CM

## 2017-10-03 DIAGNOSIS — R0789 Other chest pain: Secondary | ICD-10-CM | POA: Insufficient documentation

## 2017-10-03 LAB — CBC WITH DIFFERENTIAL/PLATELET
Basophils Absolute: 0 10*3/uL (ref 0.0–0.1)
Basophils Relative: 0 %
EOS ABS: 0.4 10*3/uL (ref 0.0–0.7)
Eosinophils Relative: 3 %
HEMATOCRIT: 34 % — AB (ref 36.0–46.0)
HEMOGLOBIN: 10.8 g/dL — AB (ref 12.0–15.0)
LYMPHS ABS: 3.5 10*3/uL (ref 0.7–4.0)
Lymphocytes Relative: 25 %
MCH: 19.3 pg — AB (ref 26.0–34.0)
MCHC: 31.8 g/dL (ref 30.0–36.0)
MCV: 60.6 fL — ABNORMAL LOW (ref 78.0–100.0)
MONO ABS: 0.7 10*3/uL (ref 0.1–1.0)
MONOS PCT: 5 %
NEUTROS PCT: 67 %
Neutro Abs: 9.2 10*3/uL — ABNORMAL HIGH (ref 1.7–7.7)
Platelets: 219 10*3/uL (ref 150–400)
RBC: 5.61 MIL/uL — ABNORMAL HIGH (ref 3.87–5.11)
RDW: 16.2 % — AB (ref 11.5–15.5)
WBC: 13.8 10*3/uL — ABNORMAL HIGH (ref 4.0–10.5)

## 2017-10-03 LAB — BASIC METABOLIC PANEL
Anion gap: 11 (ref 5–15)
BUN: 11 mg/dL (ref 6–20)
CALCIUM: 8.9 mg/dL (ref 8.9–10.3)
CHLORIDE: 106 mmol/L (ref 101–111)
CO2: 22 mmol/L (ref 22–32)
CREATININE: 0.68 mg/dL (ref 0.44–1.00)
GFR calc non Af Amer: >60 mL/min (ref >60)
Glucose, Bld: 90 mg/dL (ref 65–99)
Potassium: 4.4 mmol/L (ref 3.5–5.1)
SODIUM: 139 mmol/L (ref 135–145)

## 2017-10-03 LAB — I-STAT TROPONIN, ED: Troponin i, poc: 0 ng/mL (ref 0.00–0.08)

## 2017-10-03 LAB — RAPID STREP SCREEN (MED CTR MEBANE ONLY): Streptococcus, Group A Screen (Direct): NEGATIVE

## 2017-10-03 LAB — INFLUENZA PANEL BY PCR (TYPE A & B)
Influenza A By PCR: NEGATIVE
Influenza B By PCR: NEGATIVE

## 2017-10-03 MED ORDER — OSELTAMIVIR PHOSPHATE 75 MG PO CAPS: 75.0000 mg | ORAL_CAPSULE | Freq: Two times a day (BID) | ORAL | 0 refills | Status: DC

## 2017-10-03 MED ORDER — IBUPROFEN 400 MG PO TABS
400.0000 mg | ORAL_TABLET | Freq: Once | ORAL | Status: AC | PRN
  Administered 2017-10-03: 400 mg via ORAL
  Filled 2017-10-03: qty 1

## 2017-10-03 NOTE — ED Notes (Signed)
E-signature is not working at this time

## 2017-10-03 NOTE — ED Provider Notes (Signed)
[facility] EMERGENCY DEPARTMENT Provider Note   CSN: [id number] Arrival date & time: 10/03/17  0865     History   Chief Complaint Chief Complaint  Patient presents with  . URI  . Chest Pain    HPI Julie King is a 27 y.o. female past medical history significant for A. fib, hypertension, morbid obesity, IBS, abnormal findings on EKG T wave inversions and QT prolongation presenting with sudden onset myalgias, fever high of 101.2 at home, chills, headache and sore throat.  She states that she had just gotten over an upper respiratory infection a week ago and had not been coughing for the past week. 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    She later reports sharp substernal chest pain that increased in intensity while she was in the emergency department started progressively last night.  Denies any radiation, pleuritic component, cough, hemoptysis, history of DVT/PE, prolonged immobilization, recent surgery, leg pain or swelling, estrogen use.  No nausea, vomiting, abdominal pain.  HPI  Past Medical History:  Diagnosis Date  . Atrial fibrillation ([place])   . Headache 07/25/2017  . Hypertension   . Irritable bowel syndrome   . Morbidly obese ([place]) 07/25/2017  . QT prolongation   . T wave inversion in EKG     Patient Active Problem List   Diagnosis Date Noted  . Headache 07/25/2017  . Morbidly obese ([place]) 07/25/2017    Past Surgical History:  Procedure Laterality Date  . CESAREAN SECTION    . CHOLECYSTECTOMY    . HERNIA REPAIR    . TONSILLECTOMY      OB History    Gravida Para Term Preterm AB Living   1 1 1     1    SAB TAB Ectopic Multiple Live Births           1       Home Medications    Prior to Admission medications   Medication Sig Start Date End Date Taking? Authorizing Provider  acetaZOLAMIDE (DIAMOX SEQUELS) 500 MG capsule Take 1 capsule (500 mg total) by mouth 2 (two) times daily. Patient not  taking: Reported on 08/03/2017 07/23/17 08/22/17  Fatima Blank, MD  albuterol (PROVENTIL HFA;VENTOLIN HFA) 108 (90 Base) MCG/ACT inhaler Inhale into the lungs every 6 (six) hours as needed for wheezing or shortness of breath.    [provider]  aspirin EC 325 MG tablet Take 1 tablet (325 mg total) by mouth daily. 04/21/17   Varney Biles, MD  aspirin-acetaminophen-caffeine (EXCEDRIN EXTRA STRENGTH) [phone] MG tablet Take 2 tablets by mouth every 4 (four) hours as needed for headache or migraine.    [provider]  diltiazem (CARDIZEM) 30 MG tablet Take 1 tablet every 4 hours AS NEEDED for AFIB rapid heart rate 04/25/17   Sherran Needs, NP  ibuprofen (ADVIL,MOTRIN) 800 MG tablet Take 1 tablet (800 mg total) by mouth every 8 (eight) hours as needed for moderate pain. Patient not taking: Reported on 08/03/2017 03/26/17   Milton Ferguson, MD  metoprolol tartrate (LOPRESSOR) 25 MG tablet Take 0.5 tablets (12.5 mg total) by mouth 2 (two) times daily. 09/01/17   Sherran Needs, NP  oseltamivir (TAMIFLU) 75 MG capsule Take 1 capsule (75 mg total) by mouth every 12 (twelve) hours. 10/03/17   Avie Echevaria B, PA-C  topiramate (TOPAMAX) 25 MG tablet Take one tablet at night for one week, then take 2 tablets at night for one week, then take  3 tablets at night. Patient not taking: Reported on 08/03/2017 08/01/17   Kathrynn Ducking, MD  traMADol (ULTRAM) 50 MG tablet Take 1 tablet (50 mg total) by mouth every 6 (six) hours as needed. Patient not taking: Reported on 08/03/2017 03/28/17 03/28/18  Julianne Handler, CNM    Family History Family History  Problem Relation Age of Onset  . Hypertension Other   . Diabetes Other   . Crohn's disease Mother   . Hypertension Mother   . Thyroid disease Father   . Diabetes Father     Social History Social History   Tobacco Use  . Smoking status: Current Every Day Smoker    Packs/day: 0.50    Types: Cigarettes  . Smokeless tobacco:  Never Used  Substance Use Topics  . Alcohol use: Yes    Comment: rare  . Drug use: No     Allergies   Percocet [oxycodone-acetaminophen]   Review of Systems Review of Systems  Constitutional: Positive for chills, fatigue and fever.  HENT: Positive for congestion and sore throat. Negative for tinnitus, trouble swallowing and voice change.   Eyes: Negative for photophobia, pain, redness and visual disturbance.  Respiratory: Negative for cough, choking, chest tightness, shortness of breath, wheezing and stridor.   Cardiovascular: Positive for chest pain. Negative for palpitations and leg swelling.  Gastrointestinal: Negative for abdominal distention, abdominal pain, diarrhea, nausea and vomiting.  Genitourinary: Negative for difficulty urinating, dysuria, flank pain, frequency, hematuria and pelvic pain.  Musculoskeletal: Positive for myalgias. Negative for arthralgias, back pain, gait problem, joint swelling, neck pain and neck stiffness.  Skin: Negative for color change, pallor, rash and wound.  Neurological: Positive for headaches. Negative for dizziness, facial asymmetry, speech difficulty, weakness, light-headedness and numbness.     Physical Exam Updated Vital Signs BP 117/75 (BP Location: Left Arm)   Pulse 89   Temp 98.2 F (36.8 C) (Oral)   Resp 19   LMP 09/29/2017   SpO2 99%   Physical Exam  Constitutional: She is oriented to person, place, and time. She appears well-developed and well-nourished.  Non-toxic appearance. She does not appear ill. No distress.  Afebrile, nontoxic-appearing, sitting comfortably in chair in no acute distress.  HENT:  Head: Normocephalic and atraumatic.  Eyes: Conjunctivae and EOM are normal.  Neck: Normal range of motion. Neck supple.  Cardiovascular: Normal rate and regular rhythm.  No murmur heard. Pulmonary/Chest: Effort normal and breath sounds normal. No accessory muscle usage or stridor. No tachypnea. No respiratory distress.    Abdominal: She exhibits no distension.  Musculoskeletal: Normal range of motion.       Right lower leg: Normal. She exhibits no tenderness and no edema.       Left lower leg: Normal. She exhibits no tenderness and no edema.  Lymphadenopathy:    She has cervical adenopathy.  Neurological: She is alert and oriented to person, place, and time.  Skin: Skin is warm and dry. No rash noted. She is not diaphoretic. No erythema. No pallor.  Psychiatric: She has a normal mood and affect.  Nursing note and vitals reviewed.    ED Treatments / Results  Labs (all labs ordered are listed, but only abnormal results are displayed) Labs Reviewed  CBC WITH DIFFERENTIAL/PLATELET - Abnormal; Notable for the following components:      Result Value   WBC 13.8 (*)    RBC 5.61 (*)    Hemoglobin 10.8 (*)    HCT 34.0 (*)    MCV 60.6 (*)  MCH 19.3 (*)    RDW 16.2 (*)    Neutro Abs 9.2 (*)    All other components within normal limits  RAPID STREP SCREEN (NOT AT [facility])  CULTURE, GROUP A STREP [facility])  BASIC METABOLIC PANEL  INFLUENZA PANEL BY PCR (TYPE A & B)  I-STAT TROPONIN, ED    EKG  EKG Interpretation None       Radiology Dg Chest 2 View  Result Date: 10/03/2017 CLINICAL DATA:  Chest pain, shortness of breath. EXAM: CHEST  2 VIEW COMPARISON:  Radiographs of September 23, 2017. FINDINGS: The heart size and mediastinal contours are within normal limits. Both lungs are clear. No pneumothorax or pleural effusion is noted. The visualized skeletal structures are unremarkable. IMPRESSION: No active cardiopulmonary disease. Electronically Signed   By: Marijo Conception, M.D.   On: 10/03/2017 13:58    Procedures Procedures (including critical care time)  Medications Ordered in ED Medications  ibuprofen (ADVIL,MOTRIN) tablet 400 mg (400 mg Oral Given 10/03/17 1028)     Initial Impression / Assessment and Plan / ED Course  I have reviewed the triage vital signs and the nursing notes.  Pertinent  labs & imaging results that were available during my care of the patient were reviewed by me and considered in my medical decision making (see chart for details).    In the setting of progressively worsening chest pain and no cough with previous abnormal findings on EKG and history of A. fib ordered EKG and cardiac workup.  EKG normal sinus rhythm and unremarkable, normal chest x-ray, negative troponin, leukocytosis, labs otherwise unremarkable.  Patient is well-appearing, nontoxic afebrile with normal vital signs and stable.  She improved while in the emergency department and was ready to go home.  Will discharge home with Tamiflu given sudden onset yesterday of flulike symptoms and known ill contacts. Influenza PCR pending.  Discharge home with symptomatic relief and close follow-up with PCP.  Discussed strict return precautions and advised to return to the emergency department if experiencing any new or worsening symptoms. Instructions were understood and patient agreed with discharge plan.  Final Clinical Impressions(s) / ED Diagnoses   Final diagnoses:  Viral upper respiratory tract infection  Chest pain, unspecified type     Emeline General, PA-C 10/03/17 1750    Long, Wonda [facility], MD 10/03/17 [phone]

## 2017-10-03 NOTE — Discharge Instructions (Signed)
As discussed, your chest x-ray did not show any signs of pneumonia today.  With your sudden onset of symptoms yesterday you are within the window for Tamiflu and have elected to get the prescription.  Start Tamiflu today for the next 5 days. Make sure that you drink plenty of fluids, keeping your urine clear and get some rest. Follow-up with your primary care provider.  Return if symptoms worsen, chest pain, shortness of breath, palpitations, nausea, vomiting or other new concerning symptoms in the meantime.

## 2017-10-03 NOTE — ED Notes (Addendum)
PT reports that a doctor in [place]  told her that she has URI and and told her to take some OTC beds. She reports that she does not feel better She denies cough Temp temp is 98.5

## 2017-10-03 NOTE — ED Notes (Signed)
Patient transported to X-ray 

## 2017-10-03 NOTE — ED Notes (Signed)
D/c reviewed with patient. No further questions at this time 

## 2017-10-05 ENCOUNTER — Emergency Department (HOSPITAL_COMMUNITY): Payer: Medicaid Other

## 2017-10-05 ENCOUNTER — Encounter (HOSPITAL_COMMUNITY): Payer: Self-pay | Admitting: Emergency Medicine

## 2017-10-05 ENCOUNTER — Emergency Department (HOSPITAL_COMMUNITY)
Admission: EM | Admit: 2017-10-05 | Discharge: 2017-10-05 | Disposition: A | Discharge status: Home / Self Care | Payer: Medicaid Other | Attending: Emergency Medicine | Admitting: Emergency Medicine

## 2017-10-05 ENCOUNTER — Other Ambulatory Visit: Payer: Self-pay

## 2017-10-05 DIAGNOSIS — Z5321 Procedure and treatment not carried out due to patient leaving prior to being seen by health care provider: Secondary | ICD-10-CM | POA: Insufficient documentation

## 2017-10-05 DIAGNOSIS — R0981 Nasal congestion: Secondary | ICD-10-CM | POA: Insufficient documentation

## 2017-10-05 DIAGNOSIS — Z79899 Other long term (current) drug therapy: Secondary | ICD-10-CM | POA: Insufficient documentation

## 2017-10-05 DIAGNOSIS — F17210 Nicotine dependence, cigarettes, uncomplicated: Secondary | ICD-10-CM | POA: Insufficient documentation

## 2017-10-05 DIAGNOSIS — Z7982 Long term (current) use of aspirin: Secondary | ICD-10-CM | POA: Insufficient documentation

## 2017-10-05 DIAGNOSIS — I10 Essential (primary) hypertension: Secondary | ICD-10-CM | POA: Insufficient documentation

## 2017-10-05 DIAGNOSIS — J069 Acute upper respiratory infection, unspecified: Secondary | ICD-10-CM | POA: Insufficient documentation

## 2017-10-05 LAB — COMPREHENSIVE METABOLIC PANEL
ALT: 13 U/L — AB (ref 14–54)
ANION GAP: 8 (ref 5–15)
AST: 16 U/L (ref 15–41)
Albumin: 3.5 g/dL (ref 3.5–5.0)
Alkaline Phosphatase: 70 U/L (ref 38–126)
BUN: 11 mg/dL (ref 6–20)
CHLORIDE: 107 mmol/L (ref 101–111)
CO2: 23 mmol/L (ref 22–32)
CREATININE: 0.7 mg/dL (ref 0.44–1.00)
Calcium: 8.8 mg/dL — ABNORMAL LOW (ref 8.9–10.3)
Glucose, Bld: 113 mg/dL — ABNORMAL HIGH (ref 65–99)
POTASSIUM: 3.9 mmol/L (ref 3.5–5.1)
Sodium: 138 mmol/L (ref 135–145)
Total Bilirubin: 0.2 mg/dL — ABNORMAL LOW (ref 0.3–1.2)
Total Protein: 7.3 g/dL (ref 6.5–8.1)

## 2017-10-05 LAB — INFLUENZA PANEL BY PCR (TYPE A & B)
INFLAPCR: NEGATIVE
INFLBPCR: NEGATIVE

## 2017-10-05 LAB — URINALYSIS, ROUTINE W REFLEX MICROSCOPIC
Bilirubin Urine: NEGATIVE
GLUCOSE, UA: NEGATIVE mg/dL
Hgb urine dipstick: NEGATIVE
Ketones, ur: NEGATIVE mg/dL
LEUKOCYTES UA: NEGATIVE
Nitrite: NEGATIVE
PROTEIN: NEGATIVE mg/dL
Specific Gravity, Urine: 1.027 (ref 1.005–1.030)
pH: 6 (ref 5.0–8.0)

## 2017-10-05 LAB — CBC
HCT: 34.3 % — ABNORMAL LOW (ref 36.0–46.0)
Hemoglobin: 10.8 g/dL — ABNORMAL LOW (ref 12.0–15.0)
MCH: 19.1 pg — ABNORMAL LOW (ref 26.0–34.0)
MCHC: 31.5 g/dL (ref 30.0–36.0)
MCV: 60.6 fL — AB (ref 78.0–100.0)
PLATELETS: 230 10*3/uL (ref 150–400)
RBC: 5.66 MIL/uL — ABNORMAL HIGH (ref 3.87–5.11)
RDW: 16.2 % — ABNORMAL HIGH (ref 11.5–15.5)
WBC: 12.1 10*3/uL — AB (ref 4.0–10.5)

## 2017-10-05 LAB — I-STAT BETA HCG BLOOD, ED (MC, WL, AP ONLY)

## 2017-10-05 LAB — CULTURE, GROUP A STREP (THRC)

## 2017-10-05 LAB — RAPID STREP SCREEN (MED CTR MEBANE ONLY): Streptococcus, Group A Screen (Direct): NEGATIVE

## 2017-10-05 MED ORDER — KETOROLAC TROMETHAMINE 60 MG/2ML IM SOLN
60.0000 mg | Freq: Once | INTRAMUSCULAR | Status: AC
  Administered 2017-10-05: 60 mg via INTRAMUSCULAR
  Filled 2017-10-05 (×2): qty 2

## 2017-10-05 NOTE — ED Notes (Signed)
Bed: WTR7 Expected date:  Expected time:  Means of arrival:  Comments: 

## 2017-10-05 NOTE — ED Provider Notes (Signed)
[facility] DEPT Provider Note   CSN: [id number] Arrival date & time: 10/05/17  0820     History   Chief Complaint Chief Complaint  Patient presents with  . Neck Pain  . Nasal Congestion  . Sore Throat  . Generalized Body Aches    HPI Julie King is a 27 y.o. female.  Patient is a 27 year old female with a prior history of obesity, hypertension, QT prolongation and atrial fibrillation who presents with flulike symptoms.  She states about 2 weeks ago she had URI symptoms which resolved.  Then 2-3 days ago she started having flulike symptoms with headache, nausea, runny nose and congestion and diffuse myalgias.  She has had a fever up to 101.  She was seen in the emergency department 2 days ago for the symptoms and started on Tamiflu.  She states yesterday she developed pain in her neck.  She states it hurts when she moves her neck around.  It does not radiate down her back.  There is no radiation to her arms.  She states it just feels sore.  She has some intermittent headaches mostly in the front part of her head.  No vision changes or photophobia.  She is been using over-the-counter medicines with some improvement in symptoms.      Past Medical History:  Diagnosis Date  . Atrial fibrillation ([place])   . Headache 07/25/2017  . Hypertension   . Irritable bowel syndrome   . Morbidly obese ([place]) 07/25/2017  . QT prolongation   . T wave inversion in EKG     Patient Active Problem List   Diagnosis Date Noted  . Headache 07/25/2017  . Morbidly obese ([place]) 07/25/2017    Past Surgical History:  Procedure Laterality Date  . CESAREAN SECTION    . CHOLECYSTECTOMY    . HERNIA REPAIR    . TONSILLECTOMY      OB History    Gravida Para Term Preterm AB Living   1 1 1     1    SAB TAB Ectopic Multiple Live Births           1       Home Medications    Prior to Admission medications   Medication Sig Start Date End Date Taking? Authorizing  Provider  acetaZOLAMIDE (DIAMOX SEQUELS) 500 MG capsule Take 1 capsule (500 mg total) by mouth 2 (two) times daily. Patient not taking: Reported on 08/03/2017 07/23/17 08/22/17  Fatima Blank, MD  albuterol (PROVENTIL HFA;VENTOLIN HFA) 108 (90 Base) MCG/ACT inhaler Inhale into the lungs every 6 (six) hours as needed for wheezing or shortness of breath.    [provider]  aspirin EC 325 MG tablet Take 1 tablet (325 mg total) by mouth daily. 04/21/17   Varney Biles, MD  aspirin-acetaminophen-caffeine (EXCEDRIN EXTRA STRENGTH) [phone] MG tablet Take 2 tablets by mouth every 4 (four) hours as needed for headache or migraine.    [provider]  diltiazem (CARDIZEM) 30 MG tablet Take 1 tablet every 4 hours AS NEEDED for AFIB rapid heart rate 04/25/17   Sherran Needs, NP  ibuprofen (ADVIL,MOTRIN) 800 MG tablet Take 1 tablet (800 mg total) by mouth every 8 (eight) hours as needed for moderate pain. Patient not taking: Reported on 08/03/2017 03/26/17   Milton Ferguson, MD  metoprolol tartrate (LOPRESSOR) 25 MG tablet Take 0.5 tablets (12.5 mg total) by mouth 2 (two) times daily. 09/01/17   Sherran Needs, NP  topiramate (TOPAMAX) 25 MG  tablet Take one tablet at night for one week, then take 2 tablets at night for one week, then take 3 tablets at night. Patient not taking: Reported on 08/03/2017 08/01/17   Kathrynn Ducking, MD  traMADol (ULTRAM) 50 MG tablet Take 1 tablet (50 mg total) by mouth every 6 (six) hours as needed. Patient not taking: Reported on 08/03/2017 03/28/17 03/28/18  Julianne Handler, CNM    Family History Family History  Problem Relation Age of Onset  . Hypertension Other   . Diabetes Other   . Crohn's disease Mother   . Hypertension Mother   . Thyroid disease Father   . Diabetes Father     Social History Social History   Tobacco Use  . Smoking status: Current Every Day Smoker    Packs/day: 0.50    Types: Cigarettes  . Smokeless tobacco: Never  Used  Substance Use Topics  . Alcohol use: Yes    Comment: rare  . Drug use: No     Allergies   Percocet [oxycodone-acetaminophen]   Review of Systems Review of Systems  Constitutional: Positive for chills, fatigue and fever. Negative for diaphoresis.  HENT: Positive for congestion and rhinorrhea. Negative for sneezing.   Eyes: Negative.   Respiratory: Positive for cough. Negative for chest tightness and shortness of breath.   Cardiovascular: Negative for chest pain and leg swelling.  Gastrointestinal: Positive for nausea. Negative for abdominal pain, blood in stool, diarrhea and vomiting.  Genitourinary: Negative for difficulty urinating, flank pain, frequency and hematuria.  Musculoskeletal: Positive for myalgias and neck pain. Negative for arthralgias and back pain.  Skin: Negative for rash.  Neurological: Positive for headaches. Negative for dizziness, speech difficulty, weakness and numbness.     Physical Exam Updated Vital Signs BP 129/79 (BP Location: Right Arm)   Pulse 87   Temp 98.5 F (36.9 C) (Oral)   Resp 18   Ht 5\' 7"  (1.702 m)   Wt 131.5 kg (290 lb)   LMP 10/02/2017   SpO2 100%   BMI 45.42 kg/m   Physical Exam  Constitutional: She is oriented to person, place, and time. She appears well-developed and well-nourished.  HENT:  Head: Normocephalic and atraumatic.  Right Ear: External ear normal.  Left Ear: External ear normal.  Nose: Nose normal.  Mouth/Throat: Oropharynx is clear and moist.  Eyes: Pupils are equal, round, and reactive to light.  Neck: Normal range of motion. Neck supple.  Patient has some tenderness over the anterior neck where the lymph nodes are slightly enlarged.  She also has some tenderness to the paraspinal muscles bilaterally, no meningismus  Cardiovascular: Normal rate, regular rhythm and normal heart sounds.  Pulmonary/Chest: Effort normal and breath sounds normal. No respiratory distress. She has no wheezes. She has no rales.  She exhibits no tenderness.  Abdominal: Soft. Bowel sounds are normal. There is no tenderness. There is no rebound and no guarding.  Musculoskeletal: Normal range of motion. She exhibits no edema.  Lymphadenopathy:    She has no cervical adenopathy.  Neurological: She is alert and oriented to person, place, and time.  Skin: Skin is warm and dry. No rash noted.  Psychiatric: She has a normal mood and affect.     ED Treatments / Results  Labs (all labs ordered are listed, but only abnormal results are displayed) Labs Reviewed  INFLUENZA PANEL BY PCR (TYPE A & B)    EKG  EKG Interpretation None       Radiology Dg Chest 2  View  Result Date: 10/05/2017 CLINICAL DATA:  Fever and chest pain EXAM: CHEST  2 VIEW COMPARISON:  10/03/2017 FINDINGS: The heart size and mediastinal contours are within normal limits. Both lungs are clear. The visualized skeletal structures are unremarkable. IMPRESSION: No active cardiopulmonary disease. Electronically Signed   By: Inez Catalina M.D.   On: 10/05/2017 09:51   Dg Chest 2 View  Result Date: 10/03/2017 CLINICAL DATA:  Chest pain, shortness of breath. EXAM: CHEST  2 VIEW COMPARISON:  Radiographs of September 23, 2017. FINDINGS: The heart size and mediastinal contours are within normal limits. Both lungs are clear. No pneumothorax or pleural effusion is noted. The visualized skeletal structures are unremarkable. IMPRESSION: No active cardiopulmonary disease. Electronically Signed   By: Marijo Conception, M.D.   On: 10/03/2017 13:58    Procedures Procedures (including critical care time)  Medications Ordered in ED Medications  ketorolac (TORADOL) injection 60 mg (60 mg Intramuscular Given 10/05/17 0947)     Initial Impression / Assessment and Plan / ED Course  I have reviewed the triage vital signs and the nursing notes.  Pertinent labs & imaging results that were available during my care of the patient were reviewed by me and considered in my medical  decision making (see chart for details).     Patient is a 27 year old who presents with URI symptoms.  She has some pain in her neck as well.  This vastly improved with Toradol.  She does not have any true meningismus.  I have a low suspicion for meningitis.  However I did tell the patient that the only way we could fully assess for meningitis is to do a lumbar puncture.  At this point she is refusing the lumbar puncture.  She is able to move her head around fully.  She does not have any pain going down her spine.  No neurologic deficits.  I feel this is likely musculoskeletal in nature.  She was discharged home in good condition.  She was advised in symptomatic care.  Return precautions were given.  Her influenza test was negative.  She has been given his prescription for Tamiflu 2 days ago but was unable to fill it due to cost.  I feel that she can use over-the-counter medicines for symptomatic relief.  Final Clinical Impressions(s) / ED Diagnoses   Final diagnoses:  Viral upper respiratory tract infection    ED Discharge Orders    None       Malvin Johns, MD 10/05/17 1105

## 2017-10-05 NOTE — ED Triage Notes (Signed)
Patient complaining of congestion, neck pain, general body aches, and sore throat. Patient was at the ED Saturday, Patient states that she could not afford her tamiflu and that she is bad.

## 2017-10-05 NOTE — ED Triage Notes (Signed)
Patient complaining of congestion, neck pain, general body aches, and sore throat. Patient was at the ED Saturday, Patient states that she could not afford her tamiflu and that she is miserable.

## 2017-10-05 NOTE — ED Notes (Signed)
Patient called and no answer. 

## 2017-10-07 LAB — CULTURE, GROUP A STREP (THRC)

## 2017-10-14 ENCOUNTER — Emergency Department (HOSPITAL_COMMUNITY)
Admission: EM | Admit: 2017-10-14 | Discharge: 2017-10-14 | Disposition: A | Discharge status: Home / Self Care | Payer: Medicaid Other | Attending: Emergency Medicine | Admitting: Emergency Medicine

## 2017-10-14 ENCOUNTER — Encounter (HOSPITAL_COMMUNITY): Payer: Self-pay | Admitting: Emergency Medicine

## 2017-10-14 ENCOUNTER — Emergency Department (HOSPITAL_COMMUNITY): Payer: Medicaid Other

## 2017-10-14 DIAGNOSIS — I10 Essential (primary) hypertension: Secondary | ICD-10-CM | POA: Insufficient documentation

## 2017-10-14 DIAGNOSIS — Z79899 Other long term (current) drug therapy: Secondary | ICD-10-CM | POA: Insufficient documentation

## 2017-10-14 DIAGNOSIS — D500 Iron deficiency anemia secondary to blood loss (chronic): Secondary | ICD-10-CM | POA: Insufficient documentation

## 2017-10-14 DIAGNOSIS — F17210 Nicotine dependence, cigarettes, uncomplicated: Secondary | ICD-10-CM | POA: Insufficient documentation

## 2017-10-14 DIAGNOSIS — Z7982 Long term (current) use of aspirin: Secondary | ICD-10-CM | POA: Insufficient documentation

## 2017-10-14 DIAGNOSIS — R102 Pelvic and perineal pain: Secondary | ICD-10-CM

## 2017-10-14 LAB — URINALYSIS, ROUTINE W REFLEX MICROSCOPIC
Bilirubin Urine: NEGATIVE
GLUCOSE, UA: NEGATIVE mg/dL
Hgb urine dipstick: NEGATIVE
Ketones, ur: NEGATIVE mg/dL
LEUKOCYTES UA: NEGATIVE
Nitrite: NEGATIVE
PROTEIN: NEGATIVE mg/dL
Specific Gravity, Urine: 1.013 (ref 1.005–1.030)
pH: 6 (ref 5.0–8.0)

## 2017-10-14 LAB — COMPREHENSIVE METABOLIC PANEL
ALBUMIN: 3.5 g/dL (ref 3.5–5.0)
ALT: 12 U/L — ABNORMAL LOW (ref 14–54)
ANION GAP: 10 (ref 5–15)
AST: 35 U/L (ref 15–41)
Alkaline Phosphatase: 76 U/L (ref 38–126)
BILIRUBIN TOTAL: 1.2 mg/dL (ref 0.3–1.2)
BUN: 8 mg/dL (ref 6–20)
CHLORIDE: 106 mmol/L (ref 101–111)
CO2: 25 mmol/L (ref 22–32)
Calcium: 9 mg/dL (ref 8.9–10.3)
Creatinine, Ser: 0.88 mg/dL (ref 0.44–1.00)
GFR calc Af Amer: >60 mL/min (ref >60)
GFR calc non Af Amer: >60 mL/min (ref >60)
GLUCOSE: 82 mg/dL (ref 65–99)
POTASSIUM: 4.8 mmol/L (ref 3.5–5.1)
Sodium: 141 mmol/L (ref 135–145)
TOTAL PROTEIN: 7.3 g/dL (ref 6.5–8.1)

## 2017-10-14 LAB — CBC
HCT: 33.2 % — ABNORMAL LOW (ref 36.0–46.0)
HEMOGLOBIN: 10.6 g/dL — AB (ref 12.0–15.0)
MCH: 19.3 pg — ABNORMAL LOW (ref 26.0–34.0)
MCHC: 31.9 g/dL (ref 30.0–36.0)
MCV: 60.5 fL — ABNORMAL LOW (ref 78.0–100.0)
PLATELETS: 261 10*3/uL (ref 150–400)
RBC: 5.49 MIL/uL — AB (ref 3.87–5.11)
RDW: 16 % — ABNORMAL HIGH (ref 11.5–15.5)
WBC: 13 10*3/uL — AB (ref 4.0–10.5)

## 2017-10-14 LAB — LIPASE, BLOOD: LIPASE: 27 U/L (ref 11–51)

## 2017-10-14 LAB — I-STAT BETA HCG BLOOD, ED (MC, WL, AP ONLY)

## 2017-10-14 MED ORDER — IBUPROFEN 200 MG PO TABS
400.0000 mg | ORAL_TABLET | Freq: Once | ORAL | Status: AC
  Administered 2017-10-14: 400 mg via ORAL
  Filled 2017-10-14: qty 2

## 2017-10-14 NOTE — ED Triage Notes (Signed)
Patient here from home with complaints of right lower abdominal pain for weeks. Reports increase in the last couple of days. Nausea no vomiting. Denies pregnancy. LMP 10/07/16.

## 2017-10-14 NOTE — Discharge Instructions (Signed)
The testing did not show any serious causes for pelvic discomfort.  Continue to treat your pain, with ibuprofen 400 mg 3 times a day with meals, as needed.  The testing indicated that you are anemic, with a low iron count.  This is probably because of heavy periods.  Try to eat foods which contain more iron, or take 1 or 2 iron pills a day for 1 month.  Anemia can explain weakness and tiredness.  If you continue to have pain follow-up with a gynecologist or primary care doctor for further evaluation and treatment.  It would be a good idea to see 1 of them for a blood count in 2-4 weeks as well.

## 2017-10-14 NOTE — ED Notes (Signed)
Pt provided with heat pack for pain relief

## 2017-10-14 NOTE — ED Notes (Signed)
Pt out of room for diagnostic procedure...will update vitals upon rtn. [employer]

## 2017-10-14 NOTE — ED Provider Notes (Signed)
[facility] DEPT Provider Note   CSN: [id number] Arrival date & time: 10/14/17  1002     History   Chief Complaint Chief Complaint  Patient presents with  . Abdominal Pain  . Nausea    HPI Julie King is a 27 y.o. female.  She complains of right lower pelvic pain, typical of usual pain with "ovarian cyst," for several weeks.  Her menses occur once a month, and she typically bleeds for 2 weeks.  Last menses began 10/02/17.  She has nausea without vomiting.  She denies dysuria urinary frequency, fever, chills, back pain, weakness or dizziness.  She does not recall when she has seen a gynecologist about ovarian cysts.  She was recently in the emergency department for upper respiratory infection, several times.  She has not seen her PCP, recently.  There are no other known modifying factors.  HPI  Past Medical History:  Diagnosis Date  . Atrial fibrillation ([place])   . Headache 07/25/2017  . Hypertension   . Irritable bowel syndrome   . Morbidly obese ([place]) 07/25/2017  . QT prolongation   . T wave inversion in EKG     Patient Active Problem List   Diagnosis Date Noted  . Headache 07/25/2017  . Morbidly obese ([place]) 07/25/2017    Past Surgical History:  Procedure Laterality Date  . CESAREAN SECTION    . CHOLECYSTECTOMY    . HERNIA REPAIR    . TONSILLECTOMY      OB History    Gravida Para Term Preterm AB Living   1 1 1     1    SAB TAB Ectopic Multiple Live Births           1       Home Medications    Prior to Admission medications   Medication Sig Start Date End Date Taking? Authorizing Provider  albuterol (PROVENTIL HFA;VENTOLIN HFA) 108 (90 Base) MCG/ACT inhaler Inhale into the lungs every 6 (six) hours as needed for wheezing or shortness of breath.   Yes [provider]  aspirin EC 325 MG tablet Take 1 tablet (325 mg total) by mouth daily. 04/21/17  Yes Varney Biles, MD  aspirin-acetaminophen-caffeine (EXCEDRIN EXTRA  STRENGTH) [phone] MG tablet Take 2 tablets by mouth every 4 (four) hours as needed for headache or migraine.   Yes [provider]  diltiazem (CARDIZEM) 30 MG tablet Take 1 tablet every 4 hours AS NEEDED for AFIB rapid heart rate 04/25/17  Yes Sherran Needs, NP  metoprolol tartrate (LOPRESSOR) 25 MG tablet Take 0.5 tablets (12.5 mg total) by mouth 2 (two) times daily. 09/01/17  Yes Sherran Needs, NP  acetaZOLAMIDE (DIAMOX SEQUELS) 500 MG capsule Take 1 capsule (500 mg total) by mouth 2 (two) times daily. Patient not taking: Reported on 08/03/2017 07/23/17 08/22/17  Fatima Blank, MD  ibuprofen (ADVIL,MOTRIN) 800 MG tablet Take 1 tablet (800 mg total) by mouth every 8 (eight) hours as needed for moderate pain. Patient not taking: Reported on 08/03/2017 03/26/17   Milton Ferguson, MD  topiramate (TOPAMAX) 25 MG tablet Take one tablet at night for one week, then take 2 tablets at night for one week, then take 3 tablets at night. Patient not taking: Reported on 08/03/2017 08/01/17   Kathrynn Ducking, MD  traMADol (ULTRAM) 50 MG tablet Take 1 tablet (50 mg total) by mouth every 6 (six) hours as needed. Patient not taking: Reported on 08/03/2017 03/28/17 03/28/18  Julianne Handler, CNM  Family History Family History  Problem Relation Age of Onset  . Hypertension Other   . Diabetes Other   . Crohn's disease Mother   . Hypertension Mother   . Thyroid disease Father   . Diabetes Father     Social History Social History   Tobacco Use  . Smoking status: Current Every Day Smoker    Packs/day: 0.50    Types: Cigarettes  . Smokeless tobacco: Never Used  Substance Use Topics  . Alcohol use: Yes    Comment: rare  . Drug use: No     Allergies   Percocet [oxycodone-acetaminophen]   Review of Systems Review of Systems  All other systems reviewed and are negative.    Physical Exam Updated Vital Signs BP 121/74 (BP Location: Left Arm)   Pulse 75   Temp 98.7 F (37.1  C) (Oral)   Resp 16   LMP 10/02/2017   SpO2 100%   Physical Exam  Constitutional: She is oriented to person, place, and time. She appears well-developed. She does not appear ill.  Morbidly obese  HENT:  Head: Normocephalic and atraumatic.  Eyes: Conjunctivae and EOM are normal. Pupils are equal, round, and reactive to light.  Neck: Normal range of motion and phonation normal. Neck supple.  Cardiovascular: Normal rate and regular rhythm.  Pulmonary/Chest: Effort normal and breath sounds normal. She exhibits no tenderness.  Abdominal: Soft. She exhibits no distension. There is no tenderness. There is no guarding.  Genitourinary:  Genitourinary Comments: No costovertebral angle tenderness with percussion  Musculoskeletal: Normal range of motion.  Neurological: She is alert and oriented to person, place, and time. She exhibits normal muscle tone.  Skin: Skin is warm and dry.  Psychiatric: She has a normal mood and affect. Her behavior is normal. Judgment and thought content normal.  Nursing note and vitals reviewed.    ED Treatments / Results  Labs (all labs ordered are listed, but only abnormal results are displayed) Labs Reviewed  COMPREHENSIVE METABOLIC PANEL - Abnormal; Notable for the following components:      Result Value   ALT 12 (*)    All other components within normal limits  CBC - Abnormal; Notable for the following components:   WBC 13.0 (*)    RBC 5.49 (*)    Hemoglobin 10.6 (*)    HCT 33.2 (*)    MCV 60.5 (*)    MCH 19.3 (*)    RDW 16.0 (*)    All other components within normal limits  LIPASE, BLOOD  URINALYSIS, ROUTINE W REFLEX MICROSCOPIC  I-STAT BETA HCG BLOOD, ED ([facility], [facility], [facility] ONLY)    EKG  EKG Interpretation None       Radiology US Transvaginal Non-ob  Result Date: 10/14/2017 CLINICAL DATA:  Right lower quadrant pain. EXAM: TRANSABDOMINAL AND TRANSVAGINAL ULTRASOUND OF PELVIS TECHNIQUE: Both transabdominal and transvaginal ultrasound  examinations of the pelvis were performed. Transabdominal technique was performed for global imaging of the pelvis including uterus, ovaries, adnexal regions, and pelvic cul-de-sac. It was necessary to proceed with endovaginal exam following the transabdominal exam to visualize the endometrium and ovaries. COMPARISON:  CT scan March 26, 2017 FINDINGS: Uterus Measurements: Uterus measures 11 x 5.4 x 6.9 cm. Two small masses are seen in the fibroid measuring 1.9 x 1.2 x 1.6 cm and 1.8 x 1.5 x 1.9 cm, consistent with fibroids. Endometrium Thickness: 13 mm.  No focal abnormality visualized. Right ovary Measurements: 5.5 x 3.8 x 3.5 cm. Contains a dominant follicle measuring  2.7 cm. Left ovary Measurements: 4.7 x 1.9 x 3.1 cm. Normal appearance/no adnexal mass. Other findings There is a paraovarian cyst on the left measuring up to 1.8 cm, simple in appearance. IMPRESSION: 1. No suspicious abnormalities identified to explain the patient's symptoms. 2. Two small fibroids in the uterus. 3. Dominant follicle measuring 2.7 cm on the right. 1.8 cm simple left paraovarian cyst of no significance. Electronically Signed   By: Dorise Bullion III M.D   On: 10/14/2017 13:24   US Pelvis Complete  Result Date: 10/14/2017 CLINICAL DATA:  Right lower quadrant pain. EXAM: TRANSABDOMINAL AND TRANSVAGINAL ULTRASOUND OF PELVIS TECHNIQUE: Both transabdominal and transvaginal ultrasound examinations of the pelvis were performed. Transabdominal technique was performed for global imaging of the pelvis including uterus, ovaries, adnexal regions, and pelvic cul-de-sac. It was necessary to proceed with endovaginal exam following the transabdominal exam to visualize the endometrium and ovaries. COMPARISON:  CT scan March 26, 2017 FINDINGS: Uterus Measurements: Uterus measures 11 x 5.4 x 6.9 cm. Two small masses are seen in the fibroid measuring 1.9 x 1.2 x 1.6 cm and 1.8 x 1.5 x 1.9 cm, consistent with fibroids. Endometrium Thickness: 13 mm.  No  focal abnormality visualized. Right ovary Measurements: 5.5 x 3.8 x 3.5 cm. Contains a dominant follicle measuring 2.7 cm. Left ovary Measurements: 4.7 x 1.9 x 3.1 cm. Normal appearance/no adnexal mass. Other findings There is a paraovarian cyst on the left measuring up to 1.8 cm, simple in appearance. IMPRESSION: 1. No suspicious abnormalities identified to explain the patient's symptoms. 2. Two small fibroids in the uterus. 3. Dominant follicle measuring 2.7 cm on the right. 1.8 cm simple left paraovarian cyst of no significance. Electronically Signed   By: Dorise Bullion III M.D   On: 10/14/2017 13:24    Procedures Procedures (including critical care time)  Medications Ordered in ED Medications  ibuprofen (ADVIL,MOTRIN) tablet 400 mg (400 mg Oral Given 10/14/17 1116)     Initial Impression / Assessment and Plan / ED Course  I have reviewed the triage vital signs and the nursing notes.  Pertinent labs & imaging results that were available during my care of the patient were reviewed by me and considered in my medical decision making (see chart for details).  Clinical Course as of Oct 14 1533  Sat Oct 14, 2017  1531 CO2: 25 [EW]    Clinical Course User Index [EW] Daleen Bo, MD     Patient Vitals for the past 24 hrs:  BP Temp Temp src Pulse Resp SpO2  10/14/17 1500 121/74 - - 75 16 100 %  10/14/17 1258 125/73 - - 79 18 98 %  10/14/17 1010 (!) 150/93 98.7 F (37.1 C) Oral 92 18 98 %    3:30 PM Reevaluation with update and discussion. After initial assessment and treatment, an updated evaluation reveals she remains comfortable and has no further complaints.  Findings discussed with patient and all questions were answered. Daleen Bo      Final Clinical Impressions(s) / ED Diagnoses   Final diagnoses:  Pelvic pain  Iron deficiency anemia due to chronic blood loss   Nonspecific pain.  Pelvic ultrasound shows small benign left ovarian cyst, and right follicular cyst.   Also present are small uterine fibroids which appear benign.  Screening labs including urinalysis and blood work are reassuring.  Nonspecific mild white blood cell count elevation, persistent low hemoglobin 52.8, normal metabolic panel.  Nursing Notes Reviewed/ Care Coordinated Applicable Imaging Reviewed Interpretation of  Laboratory Data incorporated into ED treatment  The patient appears reasonably screened and/or stabilized for discharge and I doubt any other medical condition or other [facility] requiring further screening, evaluation, or treatment in the ED at this time prior to discharge.  Plan: Home Medications-ibuprofen PRN pain; Home Treatments-rest, fluids; return here if the recommended treatment, does not improve the symptoms; Recommended follow up-PCP follow-up as needed and for hemoglobin testing in 2 weeks.   ED Discharge Orders    None       Daleen Bo, MD 10/14/17 1535

## 2017-10-16 ENCOUNTER — Emergency Department (HOSPITAL_COMMUNITY)
Admission: EM | Admit: 2017-10-16 | Discharge: 2017-10-16 | Discharge status: Absent without leave | Payer: Medicaid Other

## 2017-10-16 ENCOUNTER — Other Ambulatory Visit: Payer: Self-pay

## 2017-10-16 ENCOUNTER — Inpatient Hospital Stay (HOSPITAL_COMMUNITY)
Admission: AD | Admit: 2017-10-16 | Discharge: 2017-10-16 | Disposition: A | Discharge status: Home / Self Care | Payer: Medicaid Other | Source: Ambulatory Visit | Attending: Obstetrics and Gynecology | Admitting: Obstetrics and Gynecology

## 2017-10-16 ENCOUNTER — Encounter (HOSPITAL_COMMUNITY): Payer: Self-pay | Admitting: *Deleted

## 2017-10-16 DIAGNOSIS — R630 Anorexia: Secondary | ICD-10-CM | POA: Insufficient documentation

## 2017-10-16 DIAGNOSIS — F17210 Nicotine dependence, cigarettes, uncomplicated: Secondary | ICD-10-CM | POA: Insufficient documentation

## 2017-10-16 DIAGNOSIS — Z885 Allergy status to narcotic agent status: Secondary | ICD-10-CM | POA: Insufficient documentation

## 2017-10-16 DIAGNOSIS — Z7982 Long term (current) use of aspirin: Secondary | ICD-10-CM | POA: Insufficient documentation

## 2017-10-16 DIAGNOSIS — I10 Essential (primary) hypertension: Secondary | ICD-10-CM | POA: Insufficient documentation

## 2017-10-16 DIAGNOSIS — N83299 Other ovarian cyst, unspecified side: Secondary | ICD-10-CM | POA: Insufficient documentation

## 2017-10-16 DIAGNOSIS — D259 Leiomyoma of uterus, unspecified: Secondary | ICD-10-CM | POA: Insufficient documentation

## 2017-10-16 DIAGNOSIS — R1030 Lower abdominal pain, unspecified: Secondary | ICD-10-CM | POA: Insufficient documentation

## 2017-10-16 DIAGNOSIS — Z79899 Other long term (current) drug therapy: Secondary | ICD-10-CM | POA: Insufficient documentation

## 2017-10-16 DIAGNOSIS — K589 Irritable bowel syndrome without diarrhea: Secondary | ICD-10-CM | POA: Insufficient documentation

## 2017-10-16 DIAGNOSIS — I4891 Unspecified atrial fibrillation: Secondary | ICD-10-CM | POA: Insufficient documentation

## 2017-10-16 DIAGNOSIS — Z9049 Acquired absence of other specified parts of digestive tract: Secondary | ICD-10-CM | POA: Insufficient documentation

## 2017-10-16 LAB — URINALYSIS, ROUTINE W REFLEX MICROSCOPIC
BILIRUBIN URINE: NEGATIVE
Glucose, UA: NEGATIVE mg/dL
Hgb urine dipstick: NEGATIVE
KETONES UR: NEGATIVE mg/dL
Leukocytes, UA: NEGATIVE
NITRITE: NEGATIVE
PH: 6 (ref 5.0–8.0)
Protein, ur: NEGATIVE mg/dL
Specific Gravity, Urine: 1.014 (ref 1.005–1.030)

## 2017-10-16 NOTE — MAU Provider Note (Signed)
History     CSN: [id number]  Arrival date and time: 10/16/17 1832   None     Chief Complaint  Patient presents with  . Abdominal Pain  . Anorexia   27 yo non-pregnant patient presents with lower abdominal pain x 3 days, says pain is like ovarian cysts she has had in the past. Was seen for the same in Ed 2 days ago and discharged home in stable condition. [place] 2 days ago showed simple cysts and 2 small fibroids, does not fully explain symptoms. Patient has been constipated over the past 1 week and feels bloated.    Past Medical History:  Diagnosis Date  . Atrial fibrillation ([place])   . Headache 07/25/2017  . Hypertension   . Irritable bowel syndrome   . Morbidly obese ([place]) 07/25/2017  . QT prolongation   . T wave inversion in EKG     Past Surgical History:  Procedure Laterality Date  . CESAREAN SECTION    . CHOLECYSTECTOMY    . HERNIA REPAIR    . TONSILLECTOMY      Family History  Problem Relation Age of Onset  . Hypertension Other   . Diabetes Other   . Crohn's disease Mother   . Hypertension Mother   . Thyroid disease Father   . Diabetes Father     Social History   Tobacco Use  . Smoking status: Current Every Day Smoker    Packs/day: 0.50    Types: Cigarettes  . Smokeless tobacco: Never Used  Substance Use Topics  . Alcohol use: Yes    Comment: rare  . Drug use: No    Allergies:  Allergies  Allergen Reactions  . Percocet [Oxycodone-Acetaminophen] Hives and Itching    Medications Prior to Admission  Medication Sig Dispense Refill Last Dose  . acetaZOLAMIDE (DIAMOX SEQUELS) 500 MG capsule Take 1 capsule (500 mg total) by mouth 2 (two) times daily. (Patient not taking: Reported on 08/03/2017) 60 capsule 0 Not Taking at Unknown time  . albuterol (PROVENTIL HFA;VENTOLIN HFA) 108 (90 Base) MCG/ACT inhaler Inhale into the lungs every 6 (six) hours as needed for wheezing or shortness of breath.   Past Week at Unknown time  . aspirin EC 325 MG tablet Take 1  tablet (325 mg total) by mouth daily. 30 tablet 0 10/13/2017 at Unknown time  . aspirin-acetaminophen-caffeine (EXCEDRIN EXTRA STRENGTH) 250-250-65 MG tablet Take 2 tablets by mouth every 4 (four) hours as needed for headache or migraine.   10/13/2017 at Unknown time  . diltiazem (CARDIZEM) 30 MG tablet Take 1 tablet every 4 hours AS NEEDED for AFIB rapid heart rate 45 tablet 1 Past Month at Unknown time  . ibuprofen (ADVIL,MOTRIN) 800 MG tablet Take 1 tablet (800 mg total) by mouth every 8 (eight) hours as needed for moderate pain. (Patient not taking: Reported on 08/03/2017) 21 tablet 0 Not Taking at Unknown time  . metoprolol tartrate (LOPRESSOR) 25 MG tablet Take 0.5 tablets (12.5 mg total) by mouth 2 (two) times daily. 30 tablet 3 10/14/2017 at 0800  . topiramate (TOPAMAX) 25 MG tablet Take one tablet at night for one week, then take 2 tablets at night for one week, then take 3 tablets at night. (Patient not taking: Reported on 08/03/2017) 90 tablet 3 Not Taking at Unknown time  . traMADol (ULTRAM) 50 MG tablet Take 1 tablet (50 mg total) by mouth every 6 (six) hours as needed. (Patient not taking: Reported on 08/03/2017) 20 tablet 0 Not Taking at  Unknown time    Review of Systems  Constitutional: Negative for activity change and appetite change.  HENT: Negative for congestion and ear discharge.   Eyes: Negative for discharge and itching.  Respiratory: Negative for apnea and chest tightness.   Gastrointestinal: Positive for abdominal distention and abdominal pain.  Endocrine: Negative for cold intolerance and heat intolerance.  Genitourinary: Negative for difficulty urinating and dysuria.  Neurological: Negative for dizziness and facial asymmetry.  Hematological: Negative for adenopathy. Does not bruise/bleed easily.   Physical Exam   Blood pressure 136/73, pulse 91, temperature 99 F (37.2 C), temperature source Oral, resp. rate 19, weight (!) 139.4 kg (307 lb 4 oz), last menstrual period  10/02/2017, SpO2 100 %.  Physical Exam  Constitutional: She is oriented to person, place, and time. She appears well-developed and well-nourished.  HENT:  Head: Normocephalic and atraumatic.  Eyes: Conjunctivae are normal. Pupils are equal, round, and reactive to light.  Neck: Normal range of motion. Neck supple.  Cardiovascular: Normal rate and intact distal pulses.  Respiratory: Effort normal. No respiratory distress.  GI: Soft. She exhibits no distension. There is no tenderness. There is no rebound and no guarding.  Musculoskeletal: Normal range of motion. She exhibits no edema.  Neurological: She is alert and oriented to person, place, and time.  Skin: Skin is warm and dry.  Psychiatric: She has a normal mood and affect. Her behavior is normal.    MAU Course  Procedures  MDM Patient is very well appearing on exam. Her abdominal exam is benign. I suspect constipation is the culprit for her abdomina pain. I offered her miralax cleanout but she declined. She says her pain is like previous ovarian cysts. We have recent US that does show 2 small, simple cysts. I advised tylenol and ibuprofen for the pain.   Assessment and Plan  1. Abdominal pain- follow up outpatient. Discharge home in stable condition.  [employer] 10/16/2017, 7:04 PM

## 2017-10-16 NOTE — Discharge Instructions (Signed)

## 2017-10-16 NOTE — MAU Note (Signed)
Having really really bad severe abd pain.  Is getting significantly worse.  Stomach is bloated.  Not preg. Loss of appetite.  Constipated.  Was told the pain is coming from her cysts.

## 2017-11-02 ENCOUNTER — Other Ambulatory Visit: Payer: Self-pay | Admitting: Student

## 2017-11-02 DIAGNOSIS — R234 Changes in skin texture: Secondary | ICD-10-CM

## 2017-11-03 ENCOUNTER — Other Ambulatory Visit: Payer: Self-pay | Admitting: Student

## 2017-11-06 ENCOUNTER — Ambulatory Visit
Admission: RE | Admit: 2017-11-06 | Discharge: 2017-11-06 | Disposition: A | Discharge status: Home / Self Care | Payer: Self-pay | Source: Ambulatory Visit | Attending: Student | Admitting: Student

## 2017-11-06 ENCOUNTER — Other Ambulatory Visit: Payer: Self-pay | Admitting: Physician Assistant

## 2017-11-06 ENCOUNTER — Ambulatory Visit
Admission: RE | Admit: 2017-11-06 | Discharge: 2017-11-06 | Disposition: A | Discharge status: Home / Self Care | Payer: No Typology Code available for payment source | Source: Ambulatory Visit | Attending: Physician Assistant | Admitting: Physician Assistant

## 2017-11-06 ENCOUNTER — Ambulatory Visit
Admission: RE | Admit: 2017-11-06 | Discharge: 2017-11-06 | Disposition: A | Discharge status: Home / Self Care | Payer: Medicaid Other | Source: Ambulatory Visit | Attending: Student | Admitting: Student

## 2017-11-06 ENCOUNTER — Other Ambulatory Visit: Payer: Self-pay | Admitting: Family Medicine

## 2017-11-06 ENCOUNTER — Other Ambulatory Visit: Payer: Self-pay | Admitting: Student

## 2017-11-06 ENCOUNTER — Other Ambulatory Visit: Payer: Self-pay | Admitting: *Deleted

## 2017-11-06 DIAGNOSIS — R234 Changes in skin texture: Secondary | ICD-10-CM

## 2017-11-07 ENCOUNTER — Telehealth (HOSPITAL_COMMUNITY): Payer: Self-pay | Admitting: *Deleted

## 2017-11-07 NOTE — Telephone Encounter (Signed)
Telephoned patient at home number and left message to return call to [facility] 

## 2017-11-09 ENCOUNTER — Ambulatory Visit (HOSPITAL_COMMUNITY)
Admission: RE | Admit: 2017-11-09 | Discharge: 2017-11-09 | Disposition: A | Discharge status: Home / Self Care | Payer: No Typology Code available for payment source | Source: Ambulatory Visit | Attending: Obstetrics and Gynecology | Admitting: Obstetrics and Gynecology

## 2017-11-09 ENCOUNTER — Encounter (HOSPITAL_COMMUNITY): Payer: Self-pay

## 2017-11-09 VITALS — BP 112/80 | Ht 67.0 in

## 2017-11-09 DIAGNOSIS — N6323 Unspecified lump in the left breast, lower outer quadrant: Secondary | ICD-10-CM

## 2017-11-09 DIAGNOSIS — Z1239 Encounter for other screening for malignant neoplasm of breast: Secondary | ICD-10-CM

## 2017-11-09 DIAGNOSIS — N6311 Unspecified lump in the right breast, upper outer quadrant: Secondary | ICD-10-CM

## 2017-11-09 NOTE — Progress Notes (Signed)
Patient referred to [facility] by the [facility] due to recommending a right breast biopsy. Diagnostic mammogram completed 11/06/2017.  Patient complained of a right breast lump x 1 week that is painful. Patient states the pain comes and goes. Patient rates the pain at a 6 out of 10.   Patient complained of a constant left breast pain, redness, and an increase of left breast size over past week. Patient rates the pain at a 8 out of 10.  Pap Smear: Pap smear not completed today. Last Pap smear was in January 2019 at [facility] and normal per patient. Per patient has no history of an abnormal Pap smear. No Pap smear results are in Epic.  Physical exam: Breasts Breasts symmetrical. No skin abnormalities bilateral breasts. No nipple retraction bilateral breasts. No nipple discharge bilateral breasts. No lymphadenopathy. Palpated a lump within the left breast at 5 o'clock 4 cm from the nipple. Palpated a lump within the right breast at 10 o'clock 4 cm from the nipple. Complaints of left outer breast tenderness on exam. Referred patient to the [facility] for a right breast biopsy per recommendation. Appointment scheduled for Friday, November 09, 2017 at 1445.        Pelvic/Bimanual No Pap smear completed today since last Pap smear was in January 2019 per patient. Pap smear not indicated per [facility]CP guidelines.   Smoking History: Patient is a current smoker. Discussed smoking cessation with patient. Referred to the [facility] Quitline and gave resources to free smoking cessation classes at [facility].  Patient Navigation: Patient education provided. Access to services provided for patient through [facility] program.   Breast and Cervical Cancer Risk Assessment: Patient's maternal grandmother and maternal aunt have a history of  breast cancer. Patient has no known genetic mutations or history of radiation treatment to the chest before age 33. Patient  has no history of cervical dysplasia, immunocompromised, or DES exposure in-utero.

## 2017-11-09 NOTE — Patient Instructions (Signed)
Explained breast self awareness with [name]. Patient did not need a Pap smear today due to last Pap smear was in January 2019 per patient. Let her know [facility] will cover Pap smears every 3 years unless has a history of abnormal Pap smears. Referred patient to the [facility] for a right breast biopsy per recommendation. Appointment scheduled for Friday, November 09, 2017 at 1445. Discussed smoking cessation with patient. Referred to the [facility] Quitline and gave resources to free smoking cessation classes at [facility]. Julie King verbalized understanding.  Julie King, Arvil Ch[place], RN 9:18 AM

## 2017-11-10 ENCOUNTER — Encounter (HOSPITAL_COMMUNITY): Payer: Self-pay | Admitting: *Deleted

## 2017-11-10 ENCOUNTER — Ambulatory Visit
Admission: RE | Admit: 2017-11-10 | Discharge: 2017-11-10 | Disposition: A | Discharge status: Home / Self Care | Payer: No Typology Code available for payment source | Source: Ambulatory Visit | Attending: Physician Assistant | Admitting: Physician Assistant

## 2017-11-10 DIAGNOSIS — R234 Changes in skin texture: Secondary | ICD-10-CM

## 2017-11-14 ENCOUNTER — Ambulatory Visit: Payer: Medicaid Other | Admitting: Adult Health

## 2017-11-20 ENCOUNTER — Encounter (HOSPITAL_COMMUNITY): Payer: Self-pay

## 2017-11-20 ENCOUNTER — Other Ambulatory Visit: Payer: Self-pay

## 2017-11-20 ENCOUNTER — Emergency Department (HOSPITAL_COMMUNITY)
Admission: EM | Admit: 2017-11-20 | Discharge: 2017-11-20 | Disposition: A | Discharge status: Home / Self Care | Payer: No Typology Code available for payment source | Attending: Emergency Medicine | Admitting: Emergency Medicine

## 2017-11-20 DIAGNOSIS — Z7982 Long term (current) use of aspirin: Secondary | ICD-10-CM | POA: Insufficient documentation

## 2017-11-20 DIAGNOSIS — M79622 Pain in left upper arm: Secondary | ICD-10-CM | POA: Insufficient documentation

## 2017-11-20 DIAGNOSIS — Z79899 Other long term (current) drug therapy: Secondary | ICD-10-CM | POA: Insufficient documentation

## 2017-11-20 DIAGNOSIS — I10 Essential (primary) hypertension: Secondary | ICD-10-CM | POA: Insufficient documentation

## 2017-11-20 DIAGNOSIS — F17210 Nicotine dependence, cigarettes, uncomplicated: Secondary | ICD-10-CM | POA: Insufficient documentation

## 2017-11-20 DIAGNOSIS — I4891 Unspecified atrial fibrillation: Secondary | ICD-10-CM | POA: Insufficient documentation

## 2017-11-20 NOTE — ED Triage Notes (Signed)
Patient c/o left axillary swelling and left arm pain x 3-4 weeks. Patient states she had a mammogram completed because she saw dimpling of the left breast as well. Patient states mammogram just showed density. Patient also reports that her left breast is larger.

## 2017-11-20 NOTE — Discharge Instructions (Addendum)
You can take advil/ibuprofen every 6 hours as needed for pain. Continue applying warm compresses if this provides you with relief. Follow up with your primary care or the breast center if symptoms persist. Return to the ER for fever, or new or concerning symptoms.

## 2017-11-20 NOTE — ED Provider Notes (Signed)
[facility] DEPT Provider Note   CSN: [id number] Arrival date & time: 11/20/17  0801     History   Chief Complaint Chief Complaint  Patient presents with  . axillary swelling    left    HPI Julie King is a 27 y.o. female presenting to the ED with left axillary pain began a few weeks ago.  Patient was recently evaluated by her PCP, as well as had a bilateral mammogram done on 11/03/2017.  She then had follow-up biopsy done on 11/10/2017 revealed fibroadenoma of the right breast.  She reports today with left axillary pain and swelling that radiates down her left arm.  She states a couple weeks ago her PCP prescribed her some medication, which she is unable to identify, and recommendations for warm compresses.  She states the pain is been intermittent since that time, with improvement with warm compresses.  Denies fever or chills, drainage, color change, or other complaints.  The history is provided by the patient.    Past Medical History:  Diagnosis Date  . Atrial fibrillation ([place])   . Headache 07/25/2017  . Hypertension   . Irritable bowel syndrome   . Morbidly obese ([place]) 07/25/2017  . QT prolongation   . T wave inversion in EKG     Patient Active Problem List   Diagnosis Date Noted  . Headache 07/25/2017  . Morbidly obese ([place]) 07/25/2017    Past Surgical History:  Procedure Laterality Date  . CESAREAN SECTION    . CHOLECYSTECTOMY    . HERNIA REPAIR    . TONSILLECTOMY       OB History    Gravida  1   Para  1   Term  1   Preterm      AB      Living  1     SAB      TAB      Ectopic      Multiple      Live Births  1            Home Medications    Prior to Admission medications   Medication Sig Start Date End Date Taking? Authorizing Provider  albuterol (PROVENTIL HFA;VENTOLIN HFA) 108 (90 Base) MCG/ACT inhaler Inhale 2 puffs into the lungs every 6 (six) hours as needed for wheezing or shortness of breath.     Yes [provider]  aspirin EC 325 MG tablet Take 1 tablet (325 mg total) by mouth daily. 04/21/17  Yes Varney Biles, MD  aspirin-acetaminophen-caffeine (EXCEDRIN EXTRA STRENGTH) [phone] MG tablet Take 2 tablets by mouth every 4 (four) hours as needed for headache or migraine.   Yes [provider]  diltiazem (CARDIZEM) 30 MG tablet Take 1 tablet every 4 hours AS NEEDED for AFIB rapid heart rate Patient taking differently: Take 30 mg by mouth every 4 (four) hours as needed.  04/25/17  Yes Sherran Needs, NP  ibuprofen (ADVIL,MOTRIN) 800 MG tablet Take 1 tablet (800 mg total) by mouth every 8 (eight) hours as needed for moderate pain. 03/26/17  Yes Milton Ferguson, MD  metoprolol tartrate (LOPRESSOR) 25 MG tablet Take 0.5 tablets (12.5 mg total) by mouth 2 (two) times daily. 09/01/17  Yes Sherran Needs, NP    Family History Family History  Problem Relation Age of Onset  . Hypertension Other   . Diabetes Other   . Crohn's disease Mother   . Hypertension Mother   . Thyroid disease Father   .  Diabetes Father   . Breast cancer Maternal Aunt        late 20 early 36s  . Breast cancer Maternal Grandmother        late 22s early 25    Social History Social History   Tobacco Use  . Smoking status: Current Every Day Smoker    Packs/day: 0.25    Types: Cigarettes  . Smokeless tobacco: Never Used  Substance Use Topics  . Alcohol use: Yes    Comment: rare  . Drug use: No     Allergies   Percocet [oxycodone-acetaminophen] and Pineapple   Review of Systems Review of Systems  Constitutional: Negative for chills and fever.  Musculoskeletal: Positive for myalgias.  Skin: Negative for color change.  All other systems reviewed and are negative.    Physical Exam Updated Vital Signs BP 136/75 (BP Location: Right Arm)   Pulse 99   Temp 98.6 F (37 C) (Oral)   Resp 16   Ht 5\' 7"  (1.702 m)   Wt (!) 138.8 kg (306 lb)   LMP 10/26/2017 (Exact Date)   SpO2  99%   BMI 47.93 kg/m   Physical Exam  Constitutional: She appears well-developed and well-nourished. No distress.  Morbidly obese, well-appearing  HENT:  Head: Normocephalic and atraumatic.  Eyes: Conjunctivae are normal.  Cardiovascular: Normal rate, regular rhythm, normal heart sounds and intact distal pulses.  Pulmonary/Chest: Effort normal and breath sounds normal.  Abdominal: Soft.  Musculoskeletal:  Left axilla with mild tenderness.  No erythema, edema, or warmth appreciated.  No wounds or purulent drainage.  No Palpable lymphadenopathy. Normal ROM.  Neurological: She is alert.  Skin: Skin is warm.  Psychiatric: She has a normal mood and affect. Her behavior is normal.  Nursing note and vitals reviewed.    ED Treatments / Results  Labs (all labs ordered are listed, but only abnormal results are displayed) Labs Reviewed - No data to display  EKG None  Radiology No results found.  Procedures Procedures (including critical care time) EMERGENCY DEPARTMENT US SOFT TISSUE INTERPRETATION "Study: Limited Soft Tissue Ultrasound"  INDICATIONS: Pain Multiple views of the body part were obtained in real-time with a multi-frequency linear probe  PERFORMED BY: Myself IMAGES ARCHIVED?: Yes SIDE:Left BODY PART:Axilla INTERPRETATION:  Normal soft tissue ultrasound     Medications Ordered in ED Medications - No data to display   Initial Impression / Assessment and Plan / ED Course  I have reviewed the triage vital signs and the nursing notes.  Pertinent labs & imaging results that were available during my care of the patient were reviewed by me and considered in my medical decision making (see chart for details).     Pt presenting with left axillary pain, intermittent for multiple weeks.  Recent diagnostic mammogram performed, w biopsy of R breast showing fibroadenoma. Exam without skin changes, masses, or palpable adenopathy.  Bedside ultrasound performed, without  evidence of cellulitis or fluid collection. Recommend pt treat symptomatically, and follow up with PCP vs breast clinic if symptoms persist. Return precautions discussed.  Discussed results, findings, treatment and follow up. Patient advised of return precautions. Patient verbalized understanding and agreed with plan.  Final Clinical Impressions(s) / ED Diagnoses   Final diagnoses:  Left axillary pain    ED Discharge Orders    None       Robinson, [place] N, PA-C 11/20/17 1025    Dorie Rank, MD 12/04/17 [phone]

## 2017-11-20 NOTE — ED Notes (Signed)
This nurse attempted to vitalize patient and discharge patient, but patient was found not to be in the room. Patient is not in bathroom or lobby. No patient belongings found in room. Patient eloped. EDPA notified.

## 2017-11-22 ENCOUNTER — Encounter: Payer: Medicaid Other | Admitting: Obstetrics and Gynecology

## 2017-11-30 ENCOUNTER — Ambulatory Visit: Payer: Self-pay | Admitting: Interventional Cardiology

## 2017-12-01 ENCOUNTER — Encounter: Payer: Self-pay | Admitting: Interventional Cardiology

## 2017-12-14 ENCOUNTER — Encounter (HOSPITAL_COMMUNITY): Payer: Self-pay

## 2017-12-14 ENCOUNTER — Other Ambulatory Visit: Payer: Self-pay

## 2017-12-14 ENCOUNTER — Emergency Department (HOSPITAL_COMMUNITY)
Admission: EM | Admit: 2017-12-14 | Discharge: 2017-12-14 | Disposition: A | Discharge status: Home / Self Care | Payer: No Typology Code available for payment source | Attending: Emergency Medicine | Admitting: Emergency Medicine

## 2017-12-14 DIAGNOSIS — I10 Essential (primary) hypertension: Secondary | ICD-10-CM | POA: Insufficient documentation

## 2017-12-14 DIAGNOSIS — Z79899 Other long term (current) drug therapy: Secondary | ICD-10-CM | POA: Insufficient documentation

## 2017-12-14 DIAGNOSIS — Z8679 Personal history of other diseases of the circulatory system: Secondary | ICD-10-CM | POA: Insufficient documentation

## 2017-12-14 DIAGNOSIS — H9202 Otalgia, left ear: Secondary | ICD-10-CM | POA: Insufficient documentation

## 2017-12-14 DIAGNOSIS — K0889 Other specified disorders of teeth and supporting structures: Secondary | ICD-10-CM

## 2017-12-14 DIAGNOSIS — F17210 Nicotine dependence, cigarettes, uncomplicated: Secondary | ICD-10-CM | POA: Insufficient documentation

## 2017-12-14 DIAGNOSIS — Z7982 Long term (current) use of aspirin: Secondary | ICD-10-CM | POA: Insufficient documentation

## 2017-12-14 MED ORDER — CLINDAMYCIN HCL 150 MG PO CAPS: 450.0000 mg | ORAL_CAPSULE | Freq: Three times a day (TID) | ORAL | 0 refills | Status: DC

## 2017-12-14 MED ORDER — NAPROXEN 500 MG PO TABS: 500.0000 mg | ORAL_TABLET | Freq: Two times a day (BID) | ORAL | 0 refills | Status: DC

## 2017-12-14 NOTE — ED Provider Notes (Signed)
[facility] EMERGENCY DEPARTMENT Provider Note   CSN: [id number] Arrival date & time: 12/14/17  0800     History   Chief Complaint Chief Complaint  Patient presents with  . Dental Problem    HPI Julie King is a 27 y.o. female with a hx of tobacco abuse, afib, and HTN who presents to the ED with complaint of dental discomfort/facial swelling that started about 1 week ago. Patient states that she has discomfort in the left lower dental region which is radiating to her L ear. She reports she feels as if her left face is swollen, however her family members have informed her they have not noticed a change. She has had problems with her teeth intermittently for 5 years, due to this she had some leftover abx which she started. She has had about 7 days of Penicillin VK which has not seemed to improve her symptoms. Denies neck stiffness, change in her voice, inability to swallow, fever, or chills.   HPI  Past Medical History:  Diagnosis Date  . Atrial fibrillation ([place])   . Headache 07/25/2017  . Hypertension   . Irritable bowel syndrome   . Morbidly obese ([place]) 07/25/2017  . QT prolongation   . T wave inversion in EKG     Patient Active Problem List   Diagnosis Date Noted  . Headache 07/25/2017  . Morbidly obese ([place]) 07/25/2017    Past Surgical History:  Procedure Laterality Date  . CESAREAN SECTION    . CHOLECYSTECTOMY    . HERNIA REPAIR    . TONSILLECTOMY       OB History    Gravida  1   Para  1   Term  1   Preterm      AB      Living  1     SAB      TAB      Ectopic      Multiple      Live Births  1            Home Medications    Prior to Admission medications   Medication Sig Start Date End Date Taking? Authorizing Provider  albuterol (PROVENTIL HFA;VENTOLIN HFA) 108 (90 Base) MCG/ACT inhaler Inhale 2 puffs into the lungs every 6 (six) hours as needed for wheezing or shortness of breath.     [provider]    aspirin EC 325 MG tablet Take 1 tablet (325 mg total) by mouth daily. 04/21/17   Varney Biles, MD  aspirin-acetaminophen-caffeine (EXCEDRIN EXTRA STRENGTH) [phone] MG tablet Take 2 tablets by mouth every 4 (four) hours as needed for headache or migraine.    [provider]  diltiazem (CARDIZEM) 30 MG tablet Take 1 tablet every 4 hours AS NEEDED for AFIB rapid heart rate Patient taking differently: Take 30 mg by mouth every 4 (four) hours as needed.  04/25/17   Sherran Needs, NP  ibuprofen (ADVIL,MOTRIN) 800 MG tablet Take 1 tablet (800 mg total) by mouth every 8 (eight) hours as needed for moderate pain. 03/26/17   Milton Ferguson, MD  metoprolol tartrate (LOPRESSOR) 25 MG tablet Take 0.5 tablets (12.5 mg total) by mouth 2 (two) times daily. 09/01/17   Sherran Needs, NP    Family History Family History  Problem Relation Age of Onset  . Hypertension Other   . Diabetes Other   . Crohn's disease Mother   . Hypertension Mother   . Thyroid disease Father   .  Diabetes Father   . Breast cancer Maternal Aunt        late 82 early 41s  . Breast cancer Maternal Grandmother        late 64s early 16    Social History Social History   Tobacco Use  . Smoking status: Current Every Day Smoker    Packs/day: 0.25    Types: Cigarettes  . Smokeless tobacco: Never Used  Substance Use Topics  . Alcohol use: Yes    Comment: rare  . Drug use: No     Allergies   Percocet [oxycodone-acetaminophen] and Pineapple   Review of Systems Review of Systems  Constitutional: Negative for chills and fever.  HENT: Positive for dental problem, ear pain and facial swelling. Negative for drooling and voice change.   Respiratory: Negative for shortness of breath.      Physical Exam Updated Vital Signs BP (!) 147/78 (BP Location: Right Arm)   Pulse 85   Temp 98.2 F (36.8 C) (Oral)   Resp 20   Ht 5\' 7"  (1.702 m)   Wt 136.1 kg (300 lb)   SpO2 100%   BMI 46.99 kg/m   Physical Exam   Constitutional: She appears well-developed and well-nourished.  Non-toxic appearance. No distress.  HENT:  Head: Normocephalic and atraumatic.  Right Ear: Tympanic membrane is not perforated, not erythematous, not retracted and not bulging.  Left Ear: Tympanic membrane is not perforated, not erythematous, not retracted and not bulging.  Nose: Nose normal.  Mouth/Throat: Uvula is midline and oropharynx is clear and moist. No uvula swelling. No oropharyngeal exudate, posterior oropharyngeal erythema or tonsillar abscesses.    Patient is tolerating her own secretions without difficulty.  No trismus.  No drooling.  No hot potato voice.  Submandibular compartment is soft.  No appreciable facial swelling on exam.  No external induration or fluctuance.  No external erythema or warmth.  Eyes: Conjunctivae are normal. Right eye exhibits no discharge. Left eye exhibits no discharge.  Neck: Normal range of motion. Neck supple.  Cardiovascular: Normal rate and regular rhythm.  Pulmonary/Chest: Effort normal and breath sounds normal. No respiratory distress.  Lymphadenopathy:    She has no cervical adenopathy.  Neurological: She is alert.  Psychiatric: She has a normal mood and affect. Her behavior is normal. Thought content normal.  Nursing note and vitals reviewed.    ED Treatments / Results  Labs (all labs ordered are listed, but only abnormal results are displayed) Labs Reviewed - No data to display  EKG None  Radiology No results found.  Procedures Procedures (including critical care time)  Medications Ordered in ED Medications - No data to display   Initial Impression / Assessment and Plan / ED Course  I have reviewed the triage vital signs and the nursing notes.  Pertinent labs & imaging results that were available during my care of the patient were reviewed by me and considered in my medical decision making (see chart for details).    Patient presents with complaints of  dental pain and facial swelling. Patient is nontoxic appearing, vitals WNL other than elevated BP, do not suspect HTN emergency, patient aware of need for recheck.  No gross abscess.  Exam unconcerning for Ludwig's angina or spread of infection at this time. No appreciable facial swelling on my exam. Given patient has had persistent sxs despite leftover Pen VK will prescribe Clindamycin. Will additionally given prescription for Naproxen for pain/swelling. Urged patient to follow-up with dentist, dental resources were provided.  Discussed treatment plan and need for follow up as well as return precautions. Provided opportunity for questions, patient confirmed understanding and is agreeable to plan.  Findings and plan of care discussed with supervising physician Dr. Tamera Punt who is in agreement.    Final Clinical Impressions(s) / ED Diagnoses   Final diagnoses:  Pain, dental    ED Discharge Orders        Ordered    clindamycin (CLEOCIN) 150 MG capsule  3 times daily     12/14/17 1004    naproxen (NAPROSYN) 500 MG tablet  2 times daily     12/14/17 [address], [place] R, PA-C 12/14/17 1416    Malvin Johns, MD 12/14/17 1517

## 2017-12-14 NOTE — ED Notes (Signed)
Declined W/C at D/C and was escorted to lobby by RN. 

## 2017-12-14 NOTE — ED Triage Notes (Signed)
PT reports a 5 year HX of dental infections . Pt is currently taking PCN that she did not finish on nher last infection.

## 2017-12-14 NOTE — Discharge Instructions (Signed)
Call one of the dentists offices provided to schedule an appointment for re-evaluation and further management within the next 48 hours.   I have prescribed you Clindamycin which is an antibiotic to treat the infection and Naproxen which is an anti-inflammatory medicine to treat the pain.   Please take all of your antibiotics until finished. You may develop abdominal discomfort or diarrhea from the antibiotic.  You may help offset this with probiotics which you can buy at the store (ask your pharmacist if unable to find) or get probiotics in the form of eating yogurt. Do not eat or take the probiotics until 2 hours after your antibiotic. If you are unable to tolerate these side effects follow-up with your primary care provider or return to the emergency department.   If you begin to experience any blistering, rashes, swelling, or difficulty breathing seek medical care for evaluation of potentially more serious side effects.   Be sure to eat something when taking the Naproxen as it can cause stomach upset and at worst stomach bleeding. Do not take additional non steroidal anti-inflammatory medicines such as Ibuprofen, Aleve, or Advil while taking Naproxen. You may supplement with Tylenol per over the counter dosing.   Please be aware that this medications may interact with other medications you are taking, please be sure to discuss your medication list with your pharmacist. If you are taking birth control the antibiotic will deactivate your birth control for 2 weeks.   If you start to experience and new or worsening symptoms return to the emergency department. If you start to experience fever, chills, neck stiffness/pain, inability to open your mouth, change in your voice,or inability to move your neck come back to the emergency department immediately.    Additionally your blood pressure was elevated in the emergency department today.  Please have this rechecked by your primary care provider within 1  month. Vitals:   12/14/17 0857  BP: (!) 147/78  Pulse: 85  Resp: 20  Temp: 98.2 F (36.8 C)  SpO2: 100%

## 2017-12-14 NOTE — ED Triage Notes (Signed)
Pt reports left lower dental infection x 4-5 years. Taking old prescription at this time x several days but facial swelling is worse.

## 2017-12-20 ENCOUNTER — Emergency Department (HOSPITAL_COMMUNITY)
Admission: EM | Admit: 2017-12-20 | Discharge: 2017-12-20 | Discharge status: Against Medical Advice | Payer: Self-pay | Attending: Emergency Medicine | Admitting: Emergency Medicine

## 2017-12-20 DIAGNOSIS — Z5321 Procedure and treatment not carried out due to patient leaving prior to being seen by health care provider: Secondary | ICD-10-CM | POA: Insufficient documentation

## 2018-01-02 ENCOUNTER — Emergency Department (HOSPITAL_COMMUNITY)
Admission: EM | Admit: 2018-01-02 | Discharge: 2018-01-02 | Discharge status: Against Medical Advice | Payer: Self-pay | Attending: Emergency Medicine | Admitting: Emergency Medicine

## 2018-01-02 ENCOUNTER — Emergency Department (HOSPITAL_COMMUNITY): Payer: Self-pay

## 2018-01-02 ENCOUNTER — Other Ambulatory Visit: Payer: Self-pay

## 2018-01-02 ENCOUNTER — Encounter (HOSPITAL_COMMUNITY): Payer: Self-pay | Admitting: Emergency Medicine

## 2018-01-02 DIAGNOSIS — Z5321 Procedure and treatment not carried out due to patient leaving prior to being seen by health care provider: Secondary | ICD-10-CM | POA: Insufficient documentation

## 2018-01-02 DIAGNOSIS — R05 Cough: Secondary | ICD-10-CM | POA: Insufficient documentation

## 2018-01-02 LAB — COMPREHENSIVE METABOLIC PANEL
ALK PHOS: 72 U/L (ref 38–126)
ALT: 13 U/L — AB (ref 14–54)
AST: 21 U/L (ref 15–41)
Albumin: 3.5 g/dL (ref 3.5–5.0)
Anion gap: 10 (ref 5–15)
BUN: 11 mg/dL (ref 6–20)
CALCIUM: 9.3 mg/dL (ref 8.9–10.3)
CO2: 20 mmol/L — AB (ref 22–32)
CREATININE: 0.73 mg/dL (ref 0.44–1.00)
Chloride: 112 mmol/L — ABNORMAL HIGH (ref 101–111)
GFR calc non Af Amer: >60 mL/min (ref >60)
GLUCOSE: 81 mg/dL (ref 65–99)
Potassium: 4.9 mmol/L (ref 3.5–5.1)
SODIUM: 142 mmol/L (ref 135–145)
Total Bilirubin: 0.7 mg/dL (ref 0.3–1.2)
Total Protein: 6.9 g/dL (ref 6.5–8.1)

## 2018-01-02 LAB — CBC WITH DIFFERENTIAL/PLATELET
BASOS PCT: 0 %
Basophils Absolute: 0 10*3/uL (ref 0.0–0.1)
EOS ABS: 0.4 10*3/uL (ref 0.0–0.7)
Eosinophils Relative: 3 %
HEMATOCRIT: 37 % (ref 36.0–46.0)
Hemoglobin: 11.6 g/dL — ABNORMAL LOW (ref 12.0–15.0)
Lymphocytes Relative: 28 %
Lymphs Abs: 4 10*3/uL (ref 0.7–4.0)
MCH: 19.4 pg — ABNORMAL LOW (ref 26.0–34.0)
MCHC: 31.4 g/dL (ref 30.0–36.0)
MCV: 61.8 fL — ABNORMAL LOW (ref 78.0–100.0)
MONO ABS: 0.4 10*3/uL (ref 0.1–1.0)
Monocytes Relative: 3 %
NEUTROS ABS: 9.5 10*3/uL — AB (ref 1.7–7.7)
Neutrophils Relative %: 66 %
PLATELETS: 237 10*3/uL (ref 150–400)
RBC: 5.99 MIL/uL — AB (ref 3.87–5.11)
RDW: 16.2 % — ABNORMAL HIGH (ref 11.5–15.5)
WBC: 14.3 10*3/uL — ABNORMAL HIGH (ref 4.0–10.5)

## 2018-01-02 LAB — URINALYSIS, ROUTINE W REFLEX MICROSCOPIC
Bilirubin Urine: NEGATIVE
Glucose, UA: NEGATIVE mg/dL
Hgb urine dipstick: NEGATIVE
KETONES UR: NEGATIVE mg/dL
LEUKOCYTES UA: NEGATIVE
NITRITE: NEGATIVE
PROTEIN: NEGATIVE mg/dL
Specific Gravity, Urine: 1.021 (ref 1.005–1.030)
pH: 7 (ref 5.0–8.0)

## 2018-01-02 LAB — I-STAT BETA HCG BLOOD, ED (MC, WL, AP ONLY): I-stat hCG, quantitative: <5 m[IU]/mL (ref <5)

## 2018-01-02 LAB — I-STAT CG4 LACTIC ACID, ED: Lactic Acid, Venous: 1.63 mmol/L (ref 0.5–1.9)

## 2018-01-02 NOTE — ED Triage Notes (Signed)
Patient complains of sputum that is sometimes blood tinged and sometimes grey that started a few weeks ago. Patient states she went to [facility] but left due to wait time.

## 2018-01-02 NOTE — ED Notes (Signed)
Pt decided she does not want to stay. This tech encouraged her to stay.

## 2018-01-02 NOTE — ED Notes (Signed)
Patient transported to X-ray 

## 2018-02-18 ENCOUNTER — Other Ambulatory Visit: Payer: Self-pay

## 2018-02-18 ENCOUNTER — Emergency Department (HOSPITAL_COMMUNITY)
Admission: EM | Admit: 2018-02-18 | Discharge: 2018-02-18 | Disposition: A | Discharge status: Home / Self Care | Payer: Medicaid Other | Attending: Emergency Medicine | Admitting: Emergency Medicine

## 2018-02-18 ENCOUNTER — Encounter (HOSPITAL_COMMUNITY): Payer: Self-pay | Admitting: Emergency Medicine

## 2018-02-18 DIAGNOSIS — I10 Essential (primary) hypertension: Secondary | ICD-10-CM | POA: Insufficient documentation

## 2018-02-18 DIAGNOSIS — I4891 Unspecified atrial fibrillation: Secondary | ICD-10-CM | POA: Insufficient documentation

## 2018-02-18 DIAGNOSIS — K0889 Other specified disorders of teeth and supporting structures: Secondary | ICD-10-CM | POA: Insufficient documentation

## 2018-02-18 DIAGNOSIS — F17210 Nicotine dependence, cigarettes, uncomplicated: Secondary | ICD-10-CM | POA: Insufficient documentation

## 2018-02-18 DIAGNOSIS — K047 Periapical abscess without sinus: Secondary | ICD-10-CM

## 2018-02-18 DIAGNOSIS — Z79899 Other long term (current) drug therapy: Secondary | ICD-10-CM | POA: Insufficient documentation

## 2018-02-18 MED ORDER — CLINDAMYCIN HCL 150 MG PO CAPS: 300.0000 mg | ORAL_CAPSULE | Freq: Three times a day (TID) | ORAL | 0 refills | Status: AC

## 2018-02-18 MED ORDER — CLINDAMYCIN HCL 150 MG PO CAPS
300.0000 mg | ORAL_CAPSULE | Freq: Once | ORAL | Status: AC
  Administered 2018-02-18: 300 mg via ORAL
  Filled 2018-02-18: qty 2

## 2018-02-18 MED ORDER — CHLORHEXIDINE GLUCONATE 0.12 % MT SOLN: 15.0000 mL | Freq: Two times a day (BID) | OROMUCOSAL | 0 refills | Status: DC

## 2018-02-18 NOTE — ED Triage Notes (Signed)
Pt states left lower dental pain for several days with a  Lump. No pain up into the cheek.

## 2018-02-18 NOTE — ED Notes (Signed)
E-Signature not available. Patient verbalizes understanding of discharge instructions and new medications. Has no further questions at this time.

## 2018-02-18 NOTE — Discharge Instructions (Addendum)
Please take all of your antibiotics until finished!   You may develop abdominal discomfort or diarrhea from the antibiotic.  You may help offset this with probiotics which you can buy or get in yogurt. Do not eat  or take the probiotics until 2 hours after your antibiotic.   Apply warm compresses to jaw throughout the day. Alternate 600 mg of ibuprofen and [phone] mg of Tylenol every 3 hours as needed for pain. Do not exceed 4000 mg of Tylenol daily.  You may also use warm water salt gargles, Orajel, or other over-the-counter dental pain remedies. Use Peridex mouthwash twice daily. Followup with a dentist is very important for ongoing evaluation and management of recurrent dental pain. Return to emergency department for emergent changing or worsening symptoms such as fever, worsening facial swelling, difficulty breathing or swallowing, throat tightness, or vision changes.

## 2018-02-18 NOTE — ED Provider Notes (Signed)
[facility] EMERGENCY DEPARTMENT Provider Note   CSN: [id number] Arrival date & time: 02/18/18  1834     History   Chief Complaint Chief Complaint  Patient presents with  . Dental Pain    HPI Julie King is a 27 y.o. female with history of A. fib, headache, hypertension, IBS, obesity, and QT prolongation presents for evaluation of acute onset, progressively worsening left mandibular dental pain for 3 days. States she has a long-standing history of intermittent dental pain and has not been able to follow-up with a dentist due to cost.  Pain is constant, throbbing in nature and radiates up to the left side of the face to the ear and forehead.  Pain worsens with eating, exposure to hot or cold foods, or exposure to air.  Last night she noticed worsening pressure when she went to bed.  This morning she awoke to find left lower facial swelling.  Denies significant difficulty swallowing, no drooling.  She denies fevers or chills.  She has tried Excedrin with some relief of her pain.  The history is provided by the patient.    Past Medical History:  Diagnosis Date  . Atrial fibrillation ([place])   . Headache 07/25/2017  . Hypertension   . Irritable bowel syndrome   . Morbidly obese ([place]) 07/25/2017  . QT prolongation   . T wave inversion in EKG     Patient Active Problem List   Diagnosis Date Noted  . Headache 07/25/2017  . Morbidly obese ([place]) 07/25/2017    Past Surgical History:  Procedure Laterality Date  . CESAREAN SECTION    . CHOLECYSTECTOMY    . HERNIA REPAIR    . TONSILLECTOMY       OB History    Gravida  1   Para  1   Term  1   Preterm      AB      Living  1     SAB      TAB      Ectopic      Multiple      Live Births  1            Home Medications    Prior to Admission medications   Medication Sig Start Date End Date Taking? Authorizing Provider  albuterol (PROVENTIL HFA;VENTOLIN HFA) 108 (90 Base) MCG/ACT inhaler  Inhale 2 puffs into the lungs every 6 (six) hours as needed for wheezing or shortness of breath.     [provider]  aspirin EC 325 MG tablet Take 1 tablet (325 mg total) by mouth daily. 04/21/17   Varney Biles, MD  aspirin-acetaminophen-caffeine (EXCEDRIN EXTRA STRENGTH) [phone] MG tablet Take 2 tablets by mouth every 4 (four) hours as needed for headache or migraine.    [provider]  chlorhexidine (PERIDEX) 0.12 % solution Use as directed 15 mLs in the mouth or throat 2 (two) times daily. 02/18/18   Nils Flack, Paytin Ramakrishnan A, PA-C  clindamycin (CLEOCIN) 150 MG capsule Take 2 capsules (300 mg total) by mouth 3 (three) times daily for 7 days. 02/18/18 02/25/18  Rodell Perna A, PA-C  ibuprofen (ADVIL,MOTRIN) 800 MG tablet Take 1 tablet (800 mg total) by mouth every 8 (eight) hours as needed for moderate pain. 03/26/17   Milton Ferguson, MD  metoprolol tartrate (LOPRESSOR) 25 MG tablet Take 0.5 tablets (12.5 mg total) by mouth 2 (two) times daily. 09/01/17   Sherran Needs, NP  naproxen (NAPROSYN) 500 MG tablet Take 1 tablet (  500 mg total) by mouth 2 (two) times daily. 12/14/17   Petrucelli, Glynda Jaeger, PA-C    Family History Family History  Problem Relation Age of Onset  . Hypertension Other   . Diabetes Other   . Crohn's disease Mother   . Hypertension Mother   . Thyroid disease Father   . Diabetes Father   . Breast cancer Maternal Aunt        late 5 early 64s  . Breast cancer Maternal Grandmother        late 45s early 46    Social History Social History   Tobacco Use  . Smoking status: Current Every Day Smoker    Packs/day: 0.25    Types: Cigarettes  . Smokeless tobacco: Never Used  Substance Use Topics  . Alcohol use: Yes    Comment: rare  . Drug use: No     Allergies   Percocet [oxycodone-acetaminophen] and Pineapple   Review of Systems Review of Systems  Constitutional: Negative for chills and fever.  HENT: Positive for dental problem, ear pain and facial  swelling.      Physical Exam Updated Vital Signs BP (!) 153/82 (BP Location: Right Wrist)   Pulse 78   Temp 99 F (37.2 C)   Resp 17   LMP 01/18/2018   SpO2 100%   Physical Exam  Constitutional: She appears well-developed and well-nourished. No distress.  HENT:  Head: Normocephalic and atraumatic.  Left mandibular first and second molars with significant decay and dentin exposed.  There is very mild swelling of the vestibular mucosa.  There is tenderness to percussion of the aforementioned teeth and there is some fluctuance of the buccal mucosa.  No trismus.  Tolerating secretions without difficulty. Dentition appears to be stable. No trismus. Mouth opening to at least 3 finger widths. No swelling or tenderness to the submental or regions. No swelling or tenderness into the soft tissues of the neck.    Eyes: Pupils are equal, round, and reactive to light. Conjunctivae and EOM are normal. Right eye exhibits no discharge. Left eye exhibits no discharge.  Neck: Normal range of motion and full passive range of motion without pain. Neck supple. No JVD present. No tracheal deviation present.  Cardiovascular: Normal rate, regular rhythm and normal heart sounds.  Pulmonary/Chest: Effort normal and breath sounds normal.  Abdominal: Soft. Bowel sounds are normal. She exhibits no distension. There is no tenderness.  Musculoskeletal: She exhibits no edema.  Neurological: She is alert.  Skin: Skin is warm and dry. No erythema.  Psychiatric: She has a normal mood and affect. Her behavior is normal.  Nursing note and vitals reviewed.    ED Treatments / Results  Labs (all labs ordered are listed, but only abnormal results are displayed) Labs Reviewed - No data to display  EKG None  Radiology No results found.  Procedures Procedures (including critical care time)  Medications Ordered in ED Medications  clindamycin (CLEOCIN) capsule 300 mg (300 mg Oral Given 02/18/18 2010)      Initial Impression / Assessment and Plan / ED Course  I have reviewed the triage vital signs and the nursing notes.  Pertinent labs & imaging results that were available during my care of the patient were reviewed by me and considered in my medical decision making (see chart for details).     Patient presents for evaluation of dental pain for 3 days.  She is afebrile, hypertensive in the ED but appears to be at her baseline and  is in a fair amount of pain.  Did instruct the patient to follow-up with her PCP for reevaluation of her blood pressure remains elevated.  She does have mild left mandibular facial swelling but is tolerating secretions without difficulty.  No difficulty breathing or swallowing at this time.  No evidence of Ludwig's angina or spread of infection to soft tissue.  She does have an area of fluctuance that would be amenable to drainage but she refuses at this time.  I had a long discussion regarding the risks and benefits of drainage and she understands that she risks worsening condition by refusing this procedure.  She did have improvement with a course of clindamycin the last time she presented for dental pain.  Emphasized the importance of follow-up with a dentist for further evaluation.  Encouraged alternating ibuprofen and Tylenol for pain control and discussed appropriate use of these medications.  Will discharge with course of clindamycin.  Patient understands to return for any worsening signs or symptoms including worsening facial swelling, throat tightness, drooling, or shortness of breath.  Patient and patient's friend verbalized understanding of and agreement with plan and patient stable for discharge home at this time.  Final Clinical Impressions(s) / ED Diagnoses   Final diagnoses:  Pain, dental  Dental abscess    ED Discharge Orders        Ordered    clindamycin (CLEOCIN) 150 MG capsule  3 times daily     02/18/18 2004    chlorhexidine (PERIDEX) 0.12 %  solution  2 times daily     02/18/18 2004       Debroah Baller 02/18/18 2022    Quintella Reichert, MD 02/21/18 1238

## 2018-02-25 ENCOUNTER — Encounter (HOSPITAL_COMMUNITY): Payer: Self-pay | Admitting: Emergency Medicine

## 2018-02-25 ENCOUNTER — Emergency Department (HOSPITAL_COMMUNITY)
Admission: EM | Admit: 2018-02-25 | Discharge: 2018-02-26 | Disposition: A | Discharge status: Home / Self Care | Payer: Medicaid Other | Attending: Emergency Medicine | Admitting: Emergency Medicine

## 2018-02-25 DIAGNOSIS — R200 Anesthesia of skin: Secondary | ICD-10-CM | POA: Insufficient documentation

## 2018-02-25 DIAGNOSIS — F17210 Nicotine dependence, cigarettes, uncomplicated: Secondary | ICD-10-CM | POA: Insufficient documentation

## 2018-02-25 DIAGNOSIS — I10 Essential (primary) hypertension: Secondary | ICD-10-CM | POA: Insufficient documentation

## 2018-02-25 DIAGNOSIS — Z79899 Other long term (current) drug therapy: Secondary | ICD-10-CM | POA: Insufficient documentation

## 2018-02-25 DIAGNOSIS — Z7982 Long term (current) use of aspirin: Secondary | ICD-10-CM | POA: Insufficient documentation

## 2018-02-25 NOTE — ED Triage Notes (Signed)
Pt c/o numbness to lower jaw x 3 days, pt denies injury, no neuro deficits.Pt states prior to numbness area was "tingling"

## 2018-02-25 NOTE — ED Provider Notes (Signed)
[facility] EMERGENCY DEPARTMENT Provider Note   CSN: [id number] Arrival date & time: 02/25/18  2025     History   Chief Complaint Chief Complaint  Patient presents with  . Numbness    HPI Julie King is a 27 y.o. female.  Patient with a history of atrial fibrillation on Metoprolol and ASA, presents with concern for facial numbness that started 2-3 days ago. She states symptoms started out as a tingling sensation in the right chin and became more like a numbness that has been persistent. She also describes a periodic sharp, shooting, "electric shock" that goes through the area, sometimes extending to the right ear and sometimes extending to the anterolateral neck. No facial swelling, dental pain, fever, difficulty swallowing or difficulty speaking. No hearing changes. No headache. No recent fever or illness. No numbness to extremities.   The history is provided by the patient. No language interpreter was used.    Past Medical History:  Diagnosis Date  . Atrial fibrillation ([place])   . Headache 07/25/2017  . Hypertension   . Irritable bowel syndrome   . Morbidly obese ([place]) 07/25/2017  . QT prolongation   . T wave inversion in EKG     Patient Active Problem List   Diagnosis Date Noted  . Headache 07/25/2017  . Morbidly obese ([place]) 07/25/2017    Past Surgical History:  Procedure Laterality Date  . CESAREAN SECTION    . CHOLECYSTECTOMY    . HERNIA REPAIR    . TONSILLECTOMY       OB History    Gravida  1   Para  1   Term  1   Preterm      AB      Living  1     SAB      TAB      Ectopic      Multiple      Live Births  1            Home Medications    Prior to Admission medications   Medication Sig Start Date End Date Taking? Authorizing Provider  albuterol (PROVENTIL HFA;VENTOLIN HFA) 108 (90 Base) MCG/ACT inhaler Inhale 2 puffs into the lungs every 6 (six) hours as needed for wheezing or shortness of breath.      [provider]  aspirin EC 325 MG tablet Take 1 tablet (325 mg total) by mouth daily. 04/21/17   Varney Biles, MD  aspirin-acetaminophen-caffeine (EXCEDRIN EXTRA STRENGTH) [phone] MG tablet Take 2 tablets by mouth every 4 (four) hours as needed for headache or migraine.    [provider]  chlorhexidine (PERIDEX) 0.12 % solution Use as directed 15 mLs in the mouth or throat 2 (two) times daily. 02/18/18   Nils Flack, Mina A, PA-C  clindamycin (CLEOCIN) 150 MG capsule Take 2 capsules (300 mg total) by mouth 3 (three) times daily for 7 days. 02/18/18 02/25/18  Rodell Perna A, PA-C  ibuprofen (ADVIL,MOTRIN) 800 MG tablet Take 1 tablet (800 mg total) by mouth every 8 (eight) hours as needed for moderate pain. 03/26/17   Milton Ferguson, MD  metoprolol tartrate (LOPRESSOR) 25 MG tablet Take 0.5 tablets (12.5 mg total) by mouth 2 (two) times daily. 09/01/17   Sherran Needs, NP  naproxen (NAPROSYN) 500 MG tablet Take 1 tablet (500 mg total) by mouth 2 (two) times daily. 12/14/17   Petrucelli, Glynda Jaeger, PA-C    Family History Family History  Problem Relation Age of Onset  .  Hypertension Other   . Diabetes Other   . Crohn's disease Mother   . Hypertension Mother   . Thyroid disease Father   . Diabetes Father   . Breast cancer Maternal Aunt        late 69 early 76s  . Breast cancer Maternal Grandmother        late 51s early 37    Social History Social History   Tobacco Use  . Smoking status: Current Every Day Smoker    Packs/day: 0.25    Types: Cigarettes  . Smokeless tobacco: Never Used  Substance Use Topics  . Alcohol use: Yes    Comment: rare  . Drug use: No     Allergies   Percocet [oxycodone-acetaminophen] and Pineapple   Review of Systems Review of Systems  Constitutional: Negative for fever.  HENT: Negative.  Negative for dental problem, ear pain, facial swelling, hearing loss, sore throat, tinnitus and trouble swallowing.   Respiratory: Negative.     Cardiovascular: Negative.   Gastrointestinal: Negative.   Musculoskeletal: Negative.   Skin: Negative.   Neurological: Positive for numbness. Negative for facial asymmetry, speech difficulty, weakness and headaches.       See HPI.     Physical Exam Updated Vital Signs BP 133/82   Pulse 86   Temp 98.5 F (36.9 C) (Oral)   Resp 16   Ht 5\' 7"  (1.702 m)   Wt 136.1 kg (300 lb)   LMP 02/25/2018   SpO2 100%   BMI 46.99 kg/m   Physical Exam  Constitutional: She is oriented to person, place, and time. She appears well-developed and well-nourished.  HENT:  No facial swelling or discoloration. Numbness to small area of right chin. No dental abnormalities. TM's are clear bilaterally.   Eyes: Pupils are equal, round, and reactive to light. Conjunctivae are normal.  Neck: Normal range of motion.  Pulmonary/Chest: Effort normal.  Neurological: She is alert and oriented to person, place, and time. She has normal strength and normal reflexes. No sensory deficit. Coordination normal. GCS eye subscore is 4. GCS verbal subscore is 5. GCS motor subscore is 6.  CN's 3-12 intact.   Skin: Skin is warm and dry.     ED Treatments / Results  Labs (all labs ordered are listed, but only abnormal results are displayed) Labs Reviewed - No data to display  EKG None  Radiology No results found.  Procedures Procedures (including critical care time)  Medications Ordered in ED Medications - No data to display   Initial Impression / Assessment and Plan / ED Course  I have reviewed the triage vital signs and the nursing notes.  Pertinent labs & imaging results that were available during my care of the patient were reviewed by me and considered in my medical decision making (see chart for details).     Patient presents with concern for development of facial numbness without weakness for 2-3 days. The area involves a small 3 cm diameter area to right chin. No facial asymmetry. No neurologic  deficits with the exception of decreased sensation limited to this area.   Consider trigeminal neuralgia, however, the pain radiates to the neck which makes this less likely. Consider CVA, but there are no other neurologic deficits to suggest stroke.   She is felt appropriate for discharge home. She has been seen by Dr. Jannifer Franklin of neurology. She can follow up with him or with her primary care physician for recheck.  Final Clinical Impressions(s) / ED Diagnoses  Final diagnoses:  None   1. Facial numbness  ED Discharge Orders    None       Charlann Lange, PA-C 02/26/18 0006    Fatima Blank, MD 02/27/18 [phone]

## 2018-02-26 NOTE — Discharge Instructions (Addendum)
Your symptoms are not felt to represent stroke or other acute event. You can be discharged home and should follow up with either Dr. Jannifer Franklin or your primary care doctor for recheck and any further outpatient evaluation needed. Return here with any worsening symptoms or new concerns.

## 2018-02-27 ENCOUNTER — Emergency Department (HOSPITAL_COMMUNITY): Payer: Self-pay

## 2018-02-27 ENCOUNTER — Encounter (HOSPITAL_COMMUNITY): Payer: Self-pay | Admitting: Emergency Medicine

## 2018-02-27 ENCOUNTER — Emergency Department (HOSPITAL_COMMUNITY)
Admission: EM | Admit: 2018-02-27 | Discharge: 2018-02-27 | Disposition: A | Discharge status: Home / Self Care | Payer: Self-pay | Attending: Emergency Medicine | Admitting: Emergency Medicine

## 2018-02-27 DIAGNOSIS — Z79899 Other long term (current) drug therapy: Secondary | ICD-10-CM | POA: Insufficient documentation

## 2018-02-27 DIAGNOSIS — G4489 Other headache syndrome: Secondary | ICD-10-CM | POA: Insufficient documentation

## 2018-02-27 DIAGNOSIS — I4891 Unspecified atrial fibrillation: Secondary | ICD-10-CM | POA: Insufficient documentation

## 2018-02-27 DIAGNOSIS — I10 Essential (primary) hypertension: Secondary | ICD-10-CM | POA: Insufficient documentation

## 2018-02-27 DIAGNOSIS — Z7982 Long term (current) use of aspirin: Secondary | ICD-10-CM | POA: Insufficient documentation

## 2018-02-27 DIAGNOSIS — R200 Anesthesia of skin: Secondary | ICD-10-CM | POA: Insufficient documentation

## 2018-02-27 DIAGNOSIS — F17210 Nicotine dependence, cigarettes, uncomplicated: Secondary | ICD-10-CM | POA: Insufficient documentation

## 2018-02-27 LAB — COMPREHENSIVE METABOLIC PANEL
ALBUMIN: 3.6 g/dL (ref 3.5–5.0)
ALT: 15 U/L (ref 0–44)
AST: 15 U/L (ref 15–41)
Alkaline Phosphatase: 68 U/L (ref 38–126)
Anion gap: 8 (ref 5–15)
BUN: 10 mg/dL (ref 6–20)
CHLORIDE: 108 mmol/L (ref 98–111)
CO2: 26 mmol/L (ref 22–32)
CREATININE: 0.72 mg/dL (ref 0.44–1.00)
Calcium: 9 mg/dL (ref 8.9–10.3)
GFR calc Af Amer: >60 mL/min (ref >60)
GFR calc non Af Amer: >60 mL/min (ref >60)
GLUCOSE: 95 mg/dL (ref 70–99)
Potassium: 3.6 mmol/L (ref 3.5–5.1)
SODIUM: 142 mmol/L (ref 135–145)
Total Bilirubin: 0.2 mg/dL — ABNORMAL LOW (ref 0.3–1.2)
Total Protein: 7.1 g/dL (ref 6.5–8.1)

## 2018-02-27 LAB — CBC WITH DIFFERENTIAL/PLATELET
Basophils Absolute: 0 10*3/uL (ref 0.0–0.1)
Basophils Relative: 0 %
EOS ABS: 0.3 10*3/uL (ref 0.0–0.7)
Eosinophils Relative: 3 %
HCT: 33.7 % — ABNORMAL LOW (ref 36.0–46.0)
Hemoglobin: 10.5 g/dL — ABNORMAL LOW (ref 12.0–15.0)
LYMPHS ABS: 3.2 10*3/uL (ref 0.7–4.0)
Lymphocytes Relative: 30 %
MCH: 19.3 pg — ABNORMAL LOW (ref 26.0–34.0)
MCHC: 31.2 g/dL (ref 30.0–36.0)
MCV: 61.9 fL — AB (ref 78.0–100.0)
Monocytes Absolute: 0.5 10*3/uL (ref 0.1–1.0)
Monocytes Relative: 5 %
NEUTROS PCT: 62 %
Neutro Abs: 6.6 10*3/uL (ref 1.7–7.7)
PLATELETS: 230 10*3/uL (ref 150–400)
RBC: 5.44 MIL/uL — AB (ref 3.87–5.11)
RDW: 16.3 % — ABNORMAL HIGH (ref 11.5–15.5)
WBC: 10.7 10*3/uL — AB (ref 4.0–10.5)

## 2018-02-27 MED ORDER — KETOROLAC TROMETHAMINE 30 MG/ML IJ SOLN
30.0000 mg | Freq: Once | INTRAMUSCULAR | Status: AC
  Administered 2018-02-27: 30 mg via INTRAVENOUS
  Filled 2018-02-27: qty 1

## 2018-02-27 MED ORDER — METOCLOPRAMIDE HCL 5 MG/ML IJ SOLN
10.0000 mg | Freq: Once | INTRAMUSCULAR | Status: AC
  Administered 2018-02-27: 10 mg via INTRAVENOUS
  Filled 2018-02-27: qty 2

## 2018-02-27 MED ORDER — METHYLPREDNISOLONE SODIUM SUCC 125 MG IJ SOLR
125.0000 mg | Freq: Once | INTRAMUSCULAR | Status: AC
  Administered 2018-02-27: 125 mg via INTRAVENOUS
  Filled 2018-02-27: qty 2

## 2018-02-27 NOTE — ED Notes (Signed)
Pt told this writer she wanted to go home. This Probation officer let [place] know

## 2018-02-27 NOTE — ED Provider Notes (Signed)
[facility] DEPT Provider Note   CSN: [id number] Arrival date & time: 02/27/18  1809     History   Chief Complaint Chief Complaint  Patient presents with  . Numbness  . Headache    HPI Julie King is a 27 y.o. female.  HPI Patient with history of chronic headaches presents with right-sided headache and right lower facial numbness for the past 4 days.  No photophobia, nausea or vomiting.  No focal weakness.  No visual changes.  No speech changes.  Patient has a history of atrial fibrillation and is not on any anticoagulants.  States she has had episodic palpitations but denies current palpitations. Past Medical History:  Diagnosis Date  . Atrial fibrillation ([place])   . Headache 07/25/2017  . Hypertension   . Irritable bowel syndrome   . Morbidly obese ([place]) 07/25/2017  . QT prolongation   . T wave inversion in EKG     Patient Active Problem List   Diagnosis Date Noted  . Headache 07/25/2017  . Morbidly obese ([place]) 07/25/2017    Past Surgical History:  Procedure Laterality Date  . CESAREAN SECTION    . CHOLECYSTECTOMY    . HERNIA REPAIR    . TONSILLECTOMY       OB History    Gravida  1   Para  1   Term  1   Preterm      AB      Living  1     SAB      TAB      Ectopic      Multiple      Live Births  1            Home Medications    Prior to Admission medications   Medication Sig Start Date End Date Taking? Authorizing Provider  albuterol (PROVENTIL HFA;VENTOLIN HFA) 108 (90 Base) MCG/ACT inhaler Inhale 2 puffs into the lungs every 6 (six) hours as needed for wheezing or shortness of breath.     [provider]  aspirin EC 325 MG tablet Take 1 tablet (325 mg total) by mouth daily. 04/21/17   Varney Biles, MD  aspirin-acetaminophen-caffeine (EXCEDRIN EXTRA STRENGTH) [phone] MG tablet Take 2 tablets by mouth every 4 (four) hours as needed for headache or migraine.    [provider]    chlorhexidine (PERIDEX) 0.12 % solution Use as directed 15 mLs in the mouth or throat 2 (two) times daily. 02/18/18   Fawze, Mina A, PA-C  ibuprofen (ADVIL,MOTRIN) 800 MG tablet Take 1 tablet (800 mg total) by mouth every 8 (eight) hours as needed for moderate pain. 03/26/17   Milton Ferguson, MD  metoprolol tartrate (LOPRESSOR) 25 MG tablet Take 0.5 tablets (12.5 mg total) by mouth 2 (two) times daily. 09/01/17   Sherran Needs, NP  naproxen (NAPROSYN) 500 MG tablet Take 1 tablet (500 mg total) by mouth 2 (two) times daily. 12/14/17   Petrucelli, Glynda Jaeger, PA-C    Family History Family History  Problem Relation Age of Onset  . Hypertension Other   . Diabetes Other   . Crohn's disease Mother   . Hypertension Mother   . Thyroid disease Father   . Diabetes Father   . Breast cancer Maternal Aunt        late 23 early 70s  . Breast cancer Maternal Grandmother        late 74s early 25    Social History Social History   Tobacco Use  .  Smoking status: Current Every Day Smoker    Packs/day: 0.25    Types: Cigarettes  . Smokeless tobacco: Never Used  Substance Use Topics  . Alcohol use: Yes    Comment: rare  . Drug use: No     Allergies   Percocet [oxycodone-acetaminophen] and Pineapple   Review of Systems Review of Systems  Constitutional: Negative for chills and fever.  HENT: Negative for congestion, facial swelling, sinus pressure and trouble swallowing.   Eyes: Negative for photophobia and visual disturbance.  Respiratory: Negative for cough and shortness of breath.   Cardiovascular: Positive for palpitations. Negative for chest pain and leg swelling.  Gastrointestinal: Negative for abdominal pain, constipation, diarrhea, nausea and vomiting.  Genitourinary: Negative for dysuria and frequency.  Musculoskeletal: Negative for back pain, gait problem, myalgias, neck pain and neck stiffness.  Skin: Negative for rash and wound.  Neurological: Positive for numbness and  headaches. Negative for dizziness, syncope, speech difficulty, weakness and light-headedness.  All other systems reviewed and are negative.    Physical Exam Updated Vital Signs BP (!) 115/53   Pulse 75   Temp 98.5 F (36.9 C) (Oral)   Resp 18   LMP 02/25/2018   SpO2 99%   Physical Exam  Constitutional: She is oriented to person, place, and time. She appears well-developed and well-nourished. No distress.  HENT:  Head: Normocephalic and atraumatic.  Mouth/Throat: Oropharynx is clear and moist.  Patient with decreased sensation to light touch to the right lower face from the chin to the lower lip.  No facial asymmetry.  Eyes: Pupils are equal, round, and reactive to light. EOM are normal.  Neck: Normal range of motion. Neck supple.  No posterior midline cervical tenderness to palpation.  No meningismus.  Cardiovascular: Normal rate and regular rhythm. Exam reveals no gallop and no friction rub.  No murmur heard. Pulmonary/Chest: Effort normal and breath sounds normal. No stridor. No respiratory distress. She has no wheezes. She has no rales. She exhibits no tenderness.  Abdominal: Soft. Bowel sounds are normal. There is no tenderness. There is no rebound and no guarding.  Musculoskeletal: Normal range of motion. She exhibits no edema or tenderness.  No lower extremity swelling, asymmetry or tenderness.  Neurological: She is alert and oriented to person, place, and time.  Moves all extremities without focal deficit.  Sensation fully intact.  Skin: Skin is warm and dry. Capillary refill takes less than 2 seconds. No rash noted. She is not diaphoretic. No erythema.  Psychiatric: She has a normal mood and affect. Her behavior is normal.  Nursing note and vitals reviewed.    ED Treatments / Results  Labs (all labs ordered are listed, but only abnormal results are displayed) Labs Reviewed  CBC WITH DIFFERENTIAL/PLATELET - Abnormal; Notable for the following components:      Result  Value   WBC 10.7 (*)    RBC 5.44 (*)    Hemoglobin 10.5 (*)    HCT 33.7 (*)    MCV 61.9 (*)    MCH 19.3 (*)    RDW 16.3 (*)    All other components within normal limits  COMPREHENSIVE METABOLIC PANEL - Abnormal; Notable for the following components:   Total Bilirubin 0.2 (*)    All other components within normal limits    EKG EKG Interpretation  Date/Time:  Tuesday February 27 2018 20:05:32 EDT Ventricular Rate:  76 PR Interval:    QRS Duration: 89 QT Interval:  390 QTC Calculation: 439 R Axis:  56 Text Interpretation:  Sinus rhythm Confirmed by Julianne Rice [phone]) on 02/27/2018 9:21:33 PM   Radiology No results found.  Procedures Procedures (including critical care time)  Medications Ordered in ED Medications  ketorolac (TORADOL) 30 MG/ML injection 30 mg (30 mg Intravenous Given 02/27/18 2028)  metoCLOPramide (REGLAN) injection 10 mg (10 mg Intravenous Given 02/27/18 2028)  methylPREDNISolone sodium succinate (SOLU-MEDROL) 125 mg/2 mL injection 125 mg (125 mg Intravenous Given 02/27/18 2028)     Initial Impression / Assessment and Plan / ED Course  I have reviewed the triage vital signs and the nursing notes.  Pertinent labs & imaging results that were available during my care of the patient were reviewed by me and considered in my medical decision making (see chart for details).     Patient was unable to get MRI in the emergency department at [facility].  She is asking to be discharged home to follow-up with her neurologist or in the emergency department tomorrow.  EKG is normal sinus rhythm.  Patient's only neurologic symptoms are mild sensory decrease in the right lower face.  She understands need to return immediately for any worsening of her symptoms or concerns.  Final Clinical Impressions(s) / ED Diagnoses   Final diagnoses:  Numbness  Other headache syndrome    ED Discharge Orders    None       Julianne Rice, MD 02/27/18 2121

## 2018-02-27 NOTE — Discharge Instructions (Signed)
Make an appointment with your neurologist all up in the emergency department for persistent symptoms.

## 2018-02-27 NOTE — ED Triage Notes (Signed)
Patient here from home with complaints of numbness to right side of face. Headache.

## 2018-02-27 NOTE — ED Notes (Signed)
Patient verbalized understanding of discharge instructions, no questions. Patient ambulated out of ED with steady gait in no distress.  

## 2018-02-28 ENCOUNTER — Telehealth: Payer: Self-pay | Admitting: Neurology

## 2018-02-28 DIAGNOSIS — G4489 Other headache syndrome: Secondary | ICD-10-CM

## 2018-02-28 DIAGNOSIS — R200 Anesthesia of skin: Secondary | ICD-10-CM

## 2018-02-28 NOTE — Telephone Encounter (Signed)
Pt states she went to ER last night for numbness in her face and head aches.  She states they were able to perform a MRI there but she was told to contact her Doctor about having one scheduled.  Pt last saw Dr Jannifer Franklin November of 2018 .  Please call

## 2018-02-28 NOTE — Telephone Encounter (Signed)
The patient was seen in the emergency room yesterday for headache and visual numbness.  The patient will be set up for revisit sometime in the near future.  The patient did have MRI of the brain done in December 2018 which was unremarkable.

## 2018-03-05 ENCOUNTER — Telehealth: Payer: Self-pay | Admitting: Neurology

## 2018-03-05 NOTE — Telephone Encounter (Signed)
Called, LVM for pt offering 03/29/18 at 730am for work in appt with Dr. Jannifer Franklin. Did advise he was out of the office the next couple weeks and that was first available

## 2018-03-05 NOTE — Telephone Encounter (Signed)
Per Dr. Felecia Shelling- he reviewed ED d/c note and ok to order MRI brain w/o. Placed order in [place].

## 2018-03-05 NOTE — Telephone Encounter (Signed)
self pay order sent to gi. They will reach out to the pt to schedule.

## 2018-03-05 NOTE — Telephone Encounter (Signed)
Pt called back. She declined appt with Dr. Jannifer Franklin on 03/21/18 and 8/1/1 stating she needed appt asap. Advised Dr. Jannifer Franklin out of the office for two weeks. I will have to speak with WID.  She is having some chest discomfort this morning. She went to [facility] 02/27/18 and recommended she be transferred to [facility] but she declined at the time d/t having her daughter. She was told to follow up the next day with neurologist. They recommended she have MRI. She is wondering if MRI brain can be ordered without her being seen. She will have to pay out of pocket for this test. I advised I will speak with Dr. Felecia Shelling and call her back to let her know.

## 2018-03-05 NOTE — Telephone Encounter (Signed)
Noted  

## 2018-03-05 NOTE — Addendum Note (Signed)
Addended by: Rossie Muskrat L on: 03/05/2018 11:04 AM   Modules accepted: Orders

## 2018-03-10 ENCOUNTER — Other Ambulatory Visit: Payer: Medicaid Other

## 2018-03-13 ENCOUNTER — Emergency Department (HOSPITAL_COMMUNITY)
Admission: EM | Admit: 2018-03-13 | Discharge: 2018-03-13 | Disposition: A | Discharge status: Home / Self Care | Payer: Medicaid Other | Attending: Emergency Medicine | Admitting: Emergency Medicine

## 2018-03-13 ENCOUNTER — Encounter (HOSPITAL_COMMUNITY): Payer: Self-pay | Admitting: Family Medicine

## 2018-03-13 DIAGNOSIS — R600 Localized edema: Secondary | ICD-10-CM | POA: Insufficient documentation

## 2018-03-13 DIAGNOSIS — Z5321 Procedure and treatment not carried out due to patient leaving prior to being seen by health care provider: Secondary | ICD-10-CM | POA: Insufficient documentation

## 2018-03-13 NOTE — ED Notes (Signed)
Called Pt in lobby for vital recheck, no response x1.

## 2018-03-13 NOTE — ED Notes (Signed)
Called Pt in lobby no response in lobby x2.

## 2018-03-13 NOTE — ED Triage Notes (Signed)
Patient was seen on July 2, at [facility] for lower right sided facial numbness. Patient was unable to have an MRI at [facility], instead she was being going to transferred to [facility] ED for a MRI. Patient was discharged at her request due to having her daughter with her. Now, she has facial swelling to the area.

## 2018-03-15 ENCOUNTER — Other Ambulatory Visit: Payer: Self-pay

## 2018-03-15 ENCOUNTER — Encounter (HOSPITAL_COMMUNITY): Payer: Self-pay

## 2018-03-15 ENCOUNTER — Emergency Department (HOSPITAL_COMMUNITY)
Admission: EM | Admit: 2018-03-15 | Discharge: 2018-03-16 | Disposition: A | Discharge status: Home / Self Care | Payer: Medicaid Other | Attending: Emergency Medicine | Admitting: Emergency Medicine

## 2018-03-15 ENCOUNTER — Encounter (HOSPITAL_COMMUNITY): Payer: Self-pay | Admitting: Emergency Medicine

## 2018-03-15 ENCOUNTER — Emergency Department (HOSPITAL_COMMUNITY)
Admission: EM | Admit: 2018-03-15 | Discharge: 2018-03-15 | Disposition: A | Discharge status: Home / Self Care | Payer: Medicaid Other | Attending: Emergency Medicine | Admitting: Emergency Medicine

## 2018-03-15 DIAGNOSIS — Z7982 Long term (current) use of aspirin: Secondary | ICD-10-CM | POA: Insufficient documentation

## 2018-03-15 DIAGNOSIS — I10 Essential (primary) hypertension: Secondary | ICD-10-CM | POA: Insufficient documentation

## 2018-03-15 DIAGNOSIS — R600 Localized edema: Secondary | ICD-10-CM | POA: Insufficient documentation

## 2018-03-15 DIAGNOSIS — Z79899 Other long term (current) drug therapy: Secondary | ICD-10-CM | POA: Insufficient documentation

## 2018-03-15 DIAGNOSIS — R220 Localized swelling, mass and lump, head: Secondary | ICD-10-CM | POA: Insufficient documentation

## 2018-03-15 DIAGNOSIS — R202 Paresthesia of skin: Secondary | ICD-10-CM | POA: Insufficient documentation

## 2018-03-15 DIAGNOSIS — K0889 Other specified disorders of teeth and supporting structures: Secondary | ICD-10-CM | POA: Insufficient documentation

## 2018-03-15 DIAGNOSIS — F17210 Nicotine dependence, cigarettes, uncomplicated: Secondary | ICD-10-CM | POA: Insufficient documentation

## 2018-03-15 DIAGNOSIS — Z5321 Procedure and treatment not carried out due to patient leaving prior to being seen by health care provider: Secondary | ICD-10-CM | POA: Insufficient documentation

## 2018-03-15 DIAGNOSIS — R51 Headache: Secondary | ICD-10-CM | POA: Insufficient documentation

## 2018-03-15 DIAGNOSIS — R509 Fever, unspecified: Secondary | ICD-10-CM | POA: Insufficient documentation

## 2018-03-15 MED ORDER — CLINDAMYCIN HCL 150 MG PO CAPS: 450.0000 mg | ORAL_CAPSULE | Freq: Three times a day (TID) | ORAL | 0 refills | Status: DC

## 2018-03-15 MED ORDER — KETOROLAC TROMETHAMINE 15 MG/ML IJ SOLN
15.0000 mg | Freq: Once | INTRAMUSCULAR | Status: AC
  Administered 2018-03-15: 15 mg via INTRAMUSCULAR
  Filled 2018-03-15: qty 1

## 2018-03-15 NOTE — ED Triage Notes (Signed)
Pt reports facial swelling on the R lower side, states pain is in the gum line and not the tooth.

## 2018-03-15 NOTE — ED Provider Notes (Signed)
Patient placed in Quick Look pathway, seen and evaluated   Chief Complaint: facial swelling and pain  HPI:   Julie King is a 27 y.o. female who presents to the ED with right side facial pain and swelling that started 3 days ago and has gotten worse.   ROS: ENT, dental pain, facial swelling  Physical Exam:   Gen: No distress  Neuro: Awake and Alert  Skin: Warm and dry  ENT: swelling and tenderness to the gum surrounding the first lower right molar, facial swelling noted on the right.  Neck: anterior cervical nodes enlarged right.        Initiation of care has begun. The patient has been counseled on the process, plan, and necessity for staying for the completion/evaluation, and the remainder of the medical screening examination    Ashley Murrain, NP 03/15/18 1259    Carmin Muskrat, MD 03/15/18 [phone]

## 2018-03-15 NOTE — Discharge Instructions (Addendum)
You were seen in the emergency department for facial swelling and pain.  I suspect this is from an infection.  Take antibiotics as prescribed.  To maximize pain control he can take 600 mg of ibuprofen +500 to 1000 mg of acetaminophen (Tylenol) every 6-8 hours.  Apply moist heat and massage slightly to help with infection.  Return to the ER for worsening swelling, pain, fevers, chills, difficulty opening jaw, shortness of breath.  Follow-up with neurology as we discussed for outpatient MRI.

## 2018-03-15 NOTE — ED Triage Notes (Signed)
Pt c/o dental pain. Seen today for the same. Reports she can't afford the prescription that was given to her earlier, states "y'all need to do something about this pain".

## 2018-03-15 NOTE — ED Provider Notes (Signed)
[facility] EMERGENCY DEPARTMENT Provider Note   CSN: [id number] Arrival date & time: 03/15/18  1243     History   Chief Complaint Chief Complaint  Patient presents with  . Facial Swelling    HPI Julie King is a 27 y.o. female with history of atrial fibrillation on metoprolol, aspirin, headaches, hypertension, QT prolongation, T wave inversions, IBS is here for swelling and pain to the right lower jaw that began 2 days ago.  Associated with some tingling.  Has checked temperature and it has been 99.  Taking ibuprofen which helps temporarily.  No trauma, trismus.  States when triage PA palpated her gumline and inner cheek in this area it was also painful.  Chart review shows patient was here on 7/2 for headache and right face numbness.  States that the numbness has resolved since.  She was supposed to get an MRI but unable to be performed at [facility] due to size, recommended transfer to [facility] but she declined.  Patient states since she had a scheduled appointment with neurology for MRI but could not make it due to car issues.  She does plan on rescheduling.  Currently she denies headache, loss of sensation to face, vision changes, numbness or weakness to extremities. HPI  Past Medical History:  Diagnosis Date  . Atrial fibrillation ([place])   . Headache 07/25/2017  . Hypertension   . Irritable bowel syndrome   . Morbidly obese ([place]) 07/25/2017  . QT prolongation   . T wave inversion in EKG     Patient Active Problem List   Diagnosis Date Noted  . Headache 07/25/2017  . Morbidly obese ([place]) 07/25/2017    Past Surgical History:  Procedure Laterality Date  . CESAREAN SECTION    . CHOLECYSTECTOMY    . HERNIA REPAIR    . TONSILLECTOMY       OB History    Gravida  1   Para  1   Term  1   Preterm      AB      Living  1     SAB      TAB      Ectopic      Multiple      Live Births  1            Home Medications    Prior to  Admission medications   Medication Sig Start Date End Date Taking? Authorizing Provider  albuterol (PROVENTIL HFA;VENTOLIN HFA) 108 (90 Base) MCG/ACT inhaler Inhale 2 puffs into the lungs every 6 (six) hours as needed for wheezing or shortness of breath.     [provider]  aspirin EC 325 MG tablet Take 1 tablet (325 mg total) by mouth daily. 04/21/17   Varney Biles, MD  aspirin-acetaminophen-caffeine (EXCEDRIN EXTRA STRENGTH) [phone] MG tablet Take 2 tablets by mouth every 4 (four) hours as needed for headache or migraine.    [provider]  chlorhexidine (PERIDEX) 0.12 % solution Use as directed 15 mLs in the mouth or throat 2 (two) times daily. 02/18/18   Nils Flack, Mina A, PA-C  clindamycin (CLEOCIN) 150 MG capsule Take 3 capsules (450 mg total) by mouth 3 (three) times daily for 7 days. 03/15/18 03/22/18  Kinnie Feil, PA-C  ibuprofen (ADVIL,MOTRIN) 800 MG tablet Take 1 tablet (800 mg total) by mouth every 8 (eight) hours as needed for moderate pain. 03/26/17   Milton Ferguson, MD  metoprolol tartrate (LOPRESSOR) 25 MG tablet Take 0.5  tablets (12.5 mg total) by mouth 2 (two) times daily. 09/01/17   Sherran Needs, NP  naproxen (NAPROSYN) 500 MG tablet Take 1 tablet (500 mg total) by mouth 2 (two) times daily. 12/14/17   Petrucelli, Glynda Jaeger, PA-C    Family History Family History  Problem Relation Age of Onset  . Hypertension Other   . Diabetes Other   . Crohn's disease Mother   . Hypertension Mother   . Thyroid disease Father   . Diabetes Father   . Breast cancer Maternal Aunt        late 26 early 75s  . Breast cancer Maternal Grandmother        late 14s early 62    Social History Social History   Tobacco Use  . Smoking status: Current Every Day Smoker    Packs/day: 0.25    Types: Cigarettes  . Smokeless tobacco: Never Used  Substance Use Topics  . Alcohol use: Yes    Comment: Twice every 6 months   . Drug use: No     Allergies   Percocet  [oxycodone-acetaminophen] and Pineapple   Review of Systems Review of Systems  HENT: Positive for dental problem (gumline pain) and facial swelling.        Facial pain  All other systems reviewed and are negative.    Physical Exam Updated Vital Signs BP 137/87 (BP Location: Right Arm)   Pulse (!) 104   Temp 99 F (37.2 C) (Oral)   Resp 18   Ht 5\' 7"  (1.702 m)   Wt 131.5 kg (290 lb)   LMP 02/25/2018   SpO2 97%   BMI 45.42 kg/m   Physical Exam  Constitutional: She is oriented to person, place, and time. She appears well-developed and well-nourished. No distress.  NAD.  HENT:  Head: Normocephalic and atraumatic.  Right Ear: External ear normal.  Left Ear: External ear normal.  Nose: Nose normal.  Poor dentition, moderate plaque build up along gum line throughout. Mild focal area of edema, exquisite tenderness, minimal fluctuance to the right anterior lower cheek.  Gumline and inner cheek also tender and mildly erythematous.  No induration, erythema, trismus, dental abscess, sublingual edema or tenderness.  Normal tongue protrusion.  Oropharynx and tonsils and uvula easily visualized without edema, exudates.  Tolerating secretions.  Eyes: Conjunctivae and EOM are normal. No scleral icterus.  Neck: Normal range of motion. Neck supple.  Cardiovascular: Normal rate, regular rhythm and normal heart sounds.  No murmur heard. Pulmonary/Chest: Effort normal and breath sounds normal. She has no wheezes.  Musculoskeletal: Normal range of motion. She exhibits no deformity.  Neurological: She is alert and oriented to person, place, and time.  Skin: Skin is warm and dry. Capillary refill takes less than 2 seconds.  Psychiatric: She has a normal mood and affect. Her behavior is normal. Judgment and thought content normal.  Nursing note and vitals reviewed.    ED Treatments / Results  Labs (all labs ordered are listed, but only abnormal results are displayed) Labs Reviewed - No data  to display  EKG None  Radiology No results found.  Procedures Procedures (including critical care time)  Medications Ordered in ED Medications - No data to display   Initial Impression / Assessment and Plan / ED Course  I have reviewed the triage vital signs and the nursing notes.  Pertinent labs & imaging results that were available during my care of the patient were reviewed by me and considered in my medical  decision making (see chart for details).    Exam most consistent with early facial abscess versus cellulitis.  Area is warm, tender, swollen.  In setting of poor dental hygiene.  No associated constitutional symptoms, trismus, sublingual edema or tenderness.  Exam is non-concerning for Ludwig's, deep neck infection.  Will discharge with antibiotics, moist heat, anti-inflammatories.  Recommended follow-up with PCP in 2 to 3 days for recheck if symptoms not improving.  Discussed return precautions.  Encouraged patient to follow-up with neurology for outpatient MRI that was recommended to her 2 weeks ago.  Final Clinical Impressions(s) / ED Diagnoses   Final diagnoses:  Facial swelling    ED Discharge Orders        Ordered    clindamycin (CLEOCIN) 150 MG capsule  3 times daily     03/15/18 1351       Arlean Hopping 03/15/18 1408    Julianne Rice, MD 03/15/18 2134

## 2018-03-15 NOTE — ED Triage Notes (Signed)
Patient complains of right sided jaw pain and swelling x3 days. Denies dyspnea. Patient alert, oriented, and in no apparent distress at this time.

## 2018-03-15 NOTE — ED Notes (Signed)
Patient called 2x to reassess vital signs.

## 2018-03-15 NOTE — ED Notes (Signed)
Patient was seen leaving ED after asking about wait time. Unsure if patient would be returning patient didn't say anything to staff.

## 2018-03-16 ENCOUNTER — Emergency Department (HOSPITAL_COMMUNITY)
Admission: EM | Admit: 2018-03-16 | Discharge: 2018-03-16 | Disposition: A | Discharge status: Home / Self Care | Payer: Medicaid Other | Attending: Emergency Medicine | Admitting: Emergency Medicine

## 2018-03-16 ENCOUNTER — Encounter (HOSPITAL_COMMUNITY): Payer: Self-pay

## 2018-03-16 ENCOUNTER — Other Ambulatory Visit: Payer: Self-pay

## 2018-03-16 DIAGNOSIS — K05219 Aggressive periodontitis, localized, unspecified severity: Secondary | ICD-10-CM | POA: Insufficient documentation

## 2018-03-16 MED ORDER — CLINDAMYCIN HCL 300 MG PO CAPS: 300.0000 mg | ORAL_CAPSULE | Freq: Three times a day (TID) | ORAL | 0 refills | Status: DC

## 2018-03-16 MED ORDER — BUPIVACAINE HCL (PF) 0.25 % IJ SOLN
5.0000 mL | Freq: Once | INTRAMUSCULAR | Status: AC
  Administered 2018-03-16: 5 mL
  Filled 2018-03-16: qty 30

## 2018-03-16 MED ORDER — CLINDAMYCIN HCL 300 MG PO CAPS
300.0000 mg | ORAL_CAPSULE | Freq: Once | ORAL | Status: AC
  Administered 2018-03-16: 300 mg via ORAL
  Filled 2018-03-16: qty 1

## 2018-03-16 MED ORDER — HYDROCODONE-ACETAMINOPHEN 5-325 MG PO TABS
1.0000 | ORAL_TABLET | Freq: Once | ORAL | Status: AC
  Administered 2018-03-16: 1 via ORAL
  Filled 2018-03-16: qty 1

## 2018-03-16 MED ORDER — CLINDAMYCIN HCL 150 MG PO CAPS: 450.0000 mg | ORAL_CAPSULE | Freq: Three times a day (TID) | ORAL | 0 refills | Status: DC

## 2018-03-16 MED ORDER — NAPROXEN 500 MG PO TABS: 500.0000 mg | ORAL_TABLET | Freq: Two times a day (BID) | ORAL | 0 refills | Status: DC

## 2018-03-16 MED ORDER — CHLORHEXIDINE GLUCONATE 0.12 % MT SOLN: 15.0000 mL | Freq: Two times a day (BID) | OROMUCOSAL | 0 refills | Status: DC

## 2018-03-16 MED ORDER — DIPHENHYDRAMINE HCL 25 MG PO CAPS
25.0000 mg | ORAL_CAPSULE | Freq: Once | ORAL | Status: AC
  Administered 2018-03-16: 25 mg via ORAL
  Filled 2018-03-16: qty 1

## 2018-03-16 NOTE — ED Provider Notes (Signed)
[facility] DEPT Provider Note   CSN: [id number] Arrival date & time: 03/16/18  1105     History   Chief Complaint Chief Complaint  Patient presents with  . Dental Pain  . Facial Swelling    HPI Julie King is a 27 y.o. female who presents to the ED via EMS for facial swelling and pain. Patient was evaluated yesterday and given Rx for Clindamycin and ibuprofen. Patient went back to the ED early this am with increased pain but stated she had not started the Clindamycin. Patient states she is having continued pain of her right cheek.  She is having continued swelling.  She took to 800 mg tablets of ibuprofen without improvement of symptoms.  She has not had any of the clindamycin that was prescribed.  She denies any new changes since being evaluated earlier today, including difficulty breathing or shortness of breath.  She denies fevers, chills, nausea, vomiting, abdominal pain. Last took ibuprofen at 8 am. Patient states she can no afford the antibiotic.      HPI  Past Medical History:  Diagnosis Date  . Atrial fibrillation ([place])   . Headache 07/25/2017  . Hypertension   . Irritable bowel syndrome   . Morbidly obese ([place]) 07/25/2017  . QT prolongation   . T wave inversion in EKG     Patient Active Problem List   Diagnosis Date Noted  . Headache 07/25/2017  . Morbidly obese ([place]) 07/25/2017    Past Surgical History:  Procedure Laterality Date  . CESAREAN SECTION    . CHOLECYSTECTOMY    . HERNIA REPAIR    . TONSILLECTOMY       OB History    Gravida  1   Para  1   Term  1   Preterm      AB      Living  1     SAB      TAB      Ectopic      Multiple      Live Births  1            Home Medications    Prior to Admission medications   Medication Sig Start Date End Date Taking? Authorizing Provider  albuterol (PROVENTIL HFA;VENTOLIN HFA) 108 (90 Base) MCG/ACT inhaler Inhale 2 puffs into the lungs every 6  (six) hours as needed for wheezing or shortness of breath.    Yes [provider]  aspirin EC 325 MG tablet Take 1 tablet (325 mg total) by mouth daily. 04/21/17  Yes Varney Biles, MD  aspirin-acetaminophen-caffeine (EXCEDRIN EXTRA STRENGTH) [phone] MG tablet Take 2 tablets by mouth every 4 (four) hours as needed for headache or migraine.   Yes [provider]  metoprolol tartrate (LOPRESSOR) 25 MG tablet Take 0.5 tablets (12.5 mg total) by mouth 2 (two) times daily. 09/01/17  Yes Sherran Needs, NP  chlorhexidine (PERIDEX) 0.12 % solution Use as directed 15 mLs in the mouth or throat 2 (two) times daily. 03/16/18   Ashley Murrain, NP  clindamycin (CLEOCIN) 300 MG capsule Take 1 capsule (300 mg total) by mouth 3 (three) times daily. 03/16/18   Ashley Murrain, NP  naproxen (NAPROSYN) 500 MG tablet Take 1 tablet (500 mg total) by mouth 2 (two) times daily. 03/16/18   Ashley Murrain, NP    Family History Family History  Problem Relation Age of Onset  . Hypertension Other   . Diabetes Other   .  Crohn's disease Mother   . Hypertension Mother   . Thyroid disease Father   . Diabetes Father   . Breast cancer Maternal Aunt        late 59 early 45s  . Breast cancer Maternal Grandmother        late 61s early 39    Social History Social History   Tobacco Use  . Smoking status: Current Every Day Smoker    Packs/day: 0.25    Types: Cigarettes  . Smokeless tobacco: Never Used  Substance Use Topics  . Alcohol use: Yes    Comment: Twice every 6 months   . Drug use: No     Allergies   Percocet [oxycodone-acetaminophen] and Pineapple   Review of Systems Review of Systems  Constitutional: Negative for chills and fever.  HENT: Positive for dental problem and facial swelling.   Eyes: Negative for visual disturbance.  Respiratory: Negative for cough.   Cardiovascular: Negative for chest pain.  Gastrointestinal: Negative for abdominal pain, nausea and vomiting.    Neurological: Negative for headaches.  Hematological: Positive for adenopathy.  Psychiatric/Behavioral: Negative for confusion.     Physical Exam Updated Vital Signs BP (!) 169/109   Pulse (!) 102   Temp 99.4 F (37.4 C) (Oral)   Resp 15   Ht 5\' 7"  (1.702 m)   Wt 131.5 kg (290 lb)   LMP 02/25/2018   SpO2 98%   BMI 45.42 kg/m   Physical Exam  Constitutional: She is oriented to person, place, and time. She appears well-developed and well-nourished. No distress.  HENT:  Mouth/Throat: Uvula is midline.  Abscess to the gingival area surrounding the right lower first molar. Tender on exam.  Eyes: Conjunctivae and EOM are normal.  Neck: Normal range of motion. Neck supple.  Cardiovascular: Tachycardia present.  Pulmonary/Chest: Effort normal.  Abdominal: Soft. There is no tenderness.  Musculoskeletal: Normal range of motion.  Lymphadenopathy:    She has cervical adenopathy.  Neurological: She is alert and oriented to person, place, and time. No cranial nerve deficit.  Skin: Skin is warm and dry.  Psychiatric: Her mood appears anxious.  Nursing note and vitals reviewed.    ED Treatments / Results  Labs (all labs ordered are listed, but only abnormal results are displayed) Labs Reviewed - No data to display  Radiology No results found.  Procedures .Marland KitchenIncision and Drainage Date/Time: 03/16/2018 1:11 PM Performed by: Ashley Murrain, NP Authorized by: Ashley Murrain, NP   Consent:    Consent obtained:  Verbal   Consent given by:  Patient   Risks discussed:  Incomplete drainage and pain   Alternatives discussed:  Alternative treatment Location:    Type:  Abscess   Location:  Mouth   Mouth location:  Vestibule of mouth Anesthesia (see MAR for exact dosages):    Anesthesia method:  Topical application and local infiltration   Topical anesthetic:  Benzocaine gel   Local anesthetic:  Bupivacaine 0.25% w/o epi Procedure type:    Complexity:  Complex Procedure details:     Needle aspiration: no     Incision types:  Single straight   Incision depth:  Submucosal   Scalpel blade:  11   Wound management:  Probed and deloculated   Drainage:  Purulent   Drainage amount:  Moderate   Wound treatment:  Wound left open   Packing materials:  None Post-procedure details:    Patient tolerance of procedure:  Tolerated well, no immediate complications   (including critical  care time)  Medications Ordered in ED Medications  bupivacaine (PF) (MARCAINE) 0.25 % injection 5 mL (5 mLs Infiltration Given 03/16/18 1302)  clindamycin (CLEOCIN) capsule 300 mg (300 mg Oral Given 03/16/18 1307)     Initial Impression / Assessment and Plan / ED Course  I have reviewed the triage vital signs and the nursing notes. 27 y.o. female here with oral abscess stable for d/c with improvement with I&D. Will start antibiotics and she will f/u with dentist. Encouraged patient to brush with a soft tooth brush 3 times a day and use the medications as directed. Patient agrees with plan.  Final Clinical Impressions(s) / ED Diagnoses   Final diagnoses:  Gingival abscess    ED Discharge Orders        Ordered    clindamycin (CLEOCIN) 300 MG capsule  3 times daily     03/16/18 1306    naproxen (NAPROSYN) 500 MG tablet  2 times daily     03/16/18 1306    chlorhexidine (PERIDEX) 0.12 % solution  2 times daily     03/16/18 1306       Janit Bern [place], [place] 03/16/18 1321    Jola Schmidt, MD 03/16/18 1400

## 2018-03-16 NOTE — Discharge Instructions (Addendum)
Take antibiotics as prescribed.  Take the entire course, even if your symptoms improve. Continue using anti-inflammatories to help with pain and swelling. Use ice to help with swelling. You can apply for an orange card at the following website to help with medication assistance:   [email or web address] Follow-up with [place] and wellness as needed for follow-up.   Return to the emergency room with any new, worsening, or concerning symptoms.

## 2018-03-16 NOTE — ED Notes (Signed)
Pt ready for discharge VS documented 

## 2018-03-16 NOTE — ED Notes (Addendum)
Pt verbalizes understanding of d/c instructions. Pt received prescriptions. Pt ambulatory at d/c with all belongings and with family.  Pt asked to wait until 0100 to leave to monitor for adverse reaction of medication.

## 2018-03-16 NOTE — ED Triage Notes (Signed)
Per EMS- Patient c/o dental pain and facial swelling. Patient was seen at [facility] x 2 early this AM. Patient states she could not afford the antibiotics that were prescribed. Patient was given a prescription coupon card.

## 2018-03-16 NOTE — ED Notes (Signed)
Bed: WTR6 Expected date:  Expected time:  Means of arrival:  Comments: 

## 2018-03-16 NOTE — Discharge Instructions (Signed)
We have drained the abscess today. It is important to brush your teeth with a soft tooth brush 3 times a day and use the Peridex as directed. Follow up with a dentist for a check up and recheck of the infection in a couple weeks. Follow up sooner if symptoms are not improving.

## 2018-03-16 NOTE — ED Notes (Signed)
See EDP assessment 

## 2018-03-16 NOTE — ED Provider Notes (Signed)
[facility] EMERGENCY DEPARTMENT Provider Note   CSN: [id number] Arrival date & time: 03/15/18  2302     History   Chief Complaint Chief Complaint  Patient presents with  . Dental Pain    HPI Julie King is a 27 y.o. female presenting for evaluation of dental and facial pain.  Patient states she is having continued pain of her right cheek.  She is having continued swelling.  She was seen here earlier today, is unable to afford the medication.  She took to 800 mg tablets of ibuprofen without improvement of symptoms.  She has not had any of the clindamycin that was prescribed.  She denies any new changes since being evaluated earlier today, including difficulty breathing or shortness of breath.  She denies fevers, chills, nausea, vomiting, abdominal pain.  HPI  Past Medical History:  Diagnosis Date  . Atrial fibrillation ([place])   . Headache 07/25/2017  . Hypertension   . Irritable bowel syndrome   . Morbidly obese ([place]) 07/25/2017  . QT prolongation   . T wave inversion in EKG     Patient Active Problem List   Diagnosis Date Noted  . Headache 07/25/2017  . Morbidly obese ([place]) 07/25/2017    Past Surgical History:  Procedure Laterality Date  . CESAREAN SECTION    . CHOLECYSTECTOMY    . HERNIA REPAIR    . TONSILLECTOMY       OB History    Gravida  1   Para  1   Term  1   Preterm      AB      Living  1     SAB      TAB      Ectopic      Multiple      Live Births  1            Home Medications    Prior to Admission medications   Medication Sig Start Date End Date Taking? Authorizing Provider  albuterol (PROVENTIL HFA;VENTOLIN HFA) 108 (90 Base) MCG/ACT inhaler Inhale 2 puffs into the lungs every 6 (six) hours as needed for wheezing or shortness of breath.     [provider]  aspirin EC 325 MG tablet Take 1 tablet (325 mg total) by mouth daily. 04/21/17   Varney Biles, MD  aspirin-acetaminophen-caffeine  (EXCEDRIN EXTRA STRENGTH) [phone] MG tablet Take 2 tablets by mouth every 4 (four) hours as needed for headache or migraine.    [provider]  chlorhexidine (PERIDEX) 0.12 % solution Use as directed 15 mLs in the mouth or throat 2 (two) times daily. 02/18/18   Nils Flack, Mina A, PA-C  clindamycin (CLEOCIN) 150 MG capsule Take 3 capsules (450 mg total) by mouth 3 (three) times daily for 7 days. 03/16/18 03/23/18  Moussa Wiegand, PA-C  ibuprofen (ADVIL,MOTRIN) 800 MG tablet Take 1 tablet (800 mg total) by mouth every 8 (eight) hours as needed for moderate pain. 03/26/17   Milton Ferguson, MD  metoprolol tartrate (LOPRESSOR) 25 MG tablet Take 0.5 tablets (12.5 mg total) by mouth 2 (two) times daily. 09/01/17   Sherran Needs, NP  naproxen (NAPROSYN) 500 MG tablet Take 1 tablet (500 mg total) by mouth 2 (two) times daily. 12/14/17   Petrucelli, Glynda Jaeger, PA-C    Family History Family History  Problem Relation Age of Onset  . Hypertension Other   . Diabetes Other   . Crohn's disease Mother   . Hypertension Mother   .  Thyroid disease Father   . Diabetes Father   . Breast cancer Maternal Aunt        late 75 early 19s  . Breast cancer Maternal Grandmother        late 26s early 26    Social History Social History   Tobacco Use  . Smoking status: Current Every Day Smoker    Packs/day: 0.25    Types: Cigarettes  . Smokeless tobacco: Never Used  Substance Use Topics  . Alcohol use: Yes    Comment: Twice every 6 months   . Drug use: No     Allergies   Percocet [oxycodone-acetaminophen] and Pineapple   Review of Systems Review of Systems  HENT: Positive for dental problem and facial swelling.   All other systems reviewed and are negative.    Physical Exam Updated Vital Signs BP (!) 158/106 (BP Location: Right Arm)   Pulse 87   Temp 98.9 F (37.2 C) (Oral)   Resp 20   LMP 02/25/2018   SpO2 96%   Physical Exam  Constitutional: She is oriented to person, place, and  time. She appears well-developed and well-nourished. No distress.  Appears uncomfortable due to pain, in NAD  HENT:  Head: Normocephalic and atraumatic.  R sided facial swelling without extension past the mandible. Overall poor dentition and gum edema.  No pain under the tongue.  No trismus.  Handling secretions easily.  No obvious oral abscess.  Cheek is tender.  Eyes: Pupils are equal, round, and reactive to light. EOM are normal.  EOMI and PERRLA.  No pain behind the eye or bulging of the eye  Neck: Normal range of motion.  Cardiovascular: Normal rate, regular rhythm and intact distal pulses.  Pulmonary/Chest: Effort normal and breath sounds normal. No respiratory distress. She has no wheezes.  Abdominal: She exhibits no distension.  Musculoskeletal: Normal range of motion.  Neurological: She is alert and oriented to person, place, and time.  Skin: Skin is warm. No rash noted.  Psychiatric: She has a normal mood and affect.  Nursing note and vitals reviewed.    ED Treatments / Results  Labs (all labs ordered are listed, but only abnormal results are displayed) Labs Reviewed - No data to display  EKG None  Radiology No results found.  Procedures Procedures (including critical care time)  Medications Ordered in ED Medications  ketorolac (TORADOL) 15 MG/ML injection 15 mg (15 mg Intramuscular Given 03/15/18 2359)  HYDROcodone-acetaminophen (NORCO/VICODIN) 5-325 MG per tablet 1 tablet (1 tablet Oral Given 03/16/18 0036)  diphenhydrAMINE (BENADRYL) capsule 25 mg (25 mg Oral Given 03/16/18 0036)     Initial Impression / Assessment and Plan / ED Course  I have reviewed the triage vital signs and the nursing notes.  Pertinent labs & imaging results that were available during my care of the patient were reviewed by me and considered in my medical decision making (see chart for details).     Patient presenting for further evaluation of facial pain and swelling.  Physical exam  shows patient is afebrile not tachycardic.  She appears in comfortable due to pain, but otherwise no acute distress.  Per chart review, patient seen earlier today, discharged on clindamycin.  In reviewing physical exam, symptoms appear consistent with current exam.  Doubt Ludwig's.  Doubt orbital cellulitis.  Patient states she was unable to get the clindamycin filled due to cost, states the medication is going to cost over $200.  Patient states that she needs something for pain,  as it is hurting a lot.  Will try Toradol and reassess.  Patient reports little to no improvement with Toradol.  Will give single dose of Norco.  Benadryl given concurrently, as patient reports itching with Percocet.  Discussed risks of taking Norco if she had symptoms with Percocet, patient states she understands but wants to try this.  Patient provided a coupon to help afford medication.  Given resources to apply for an orange card.  Stressed importance of taking antibiotics.  Stressed continued use of NSAIDs for pain and swelling.  At this time, patient proceed for discharge.  Return precautions given.  Patient states she understands and agrees to plan.  Final Clinical Impressions(s) / ED Diagnoses   Final diagnoses:  Facial swelling    ED Discharge Orders        Ordered    clindamycin (CLEOCIN) 150 MG capsule  3 times daily     03/16/18 0029       Franchot Heidelberg, PA-C 03/16/18 0140    Ezequiel Essex, MD 03/16/18 0700

## 2018-04-04 ENCOUNTER — Ambulatory Visit (HOSPITAL_COMMUNITY)
Admission: EM | Admit: 2018-04-04 | Discharge: 2018-04-04 | Disposition: A | Discharge status: Home / Self Care | Payer: Self-pay | Attending: Family Medicine | Admitting: Family Medicine

## 2018-04-04 ENCOUNTER — Ambulatory Visit (INDEPENDENT_AMBULATORY_CARE_PROVIDER_SITE_OTHER): Payer: Self-pay

## 2018-04-04 ENCOUNTER — Encounter (HOSPITAL_COMMUNITY): Payer: Self-pay | Admitting: Emergency Medicine

## 2018-04-04 ENCOUNTER — Other Ambulatory Visit: Payer: Self-pay

## 2018-04-04 DIAGNOSIS — M791 Myalgia, unspecified site: Secondary | ICD-10-CM

## 2018-04-04 DIAGNOSIS — R0789 Other chest pain: Secondary | ICD-10-CM

## 2018-04-04 MED ORDER — NAPROXEN 500 MG PO TABS: 500.0000 mg | ORAL_TABLET | Freq: Two times a day (BID) | ORAL | 0 refills | Status: DC

## 2018-04-04 MED ORDER — CYCLOBENZAPRINE HCL 10 MG PO TABS: 10.0000 mg | ORAL_TABLET | Freq: Two times a day (BID) | ORAL | 0 refills | Status: DC | PRN

## 2018-04-04 NOTE — ED Provider Notes (Signed)
[facility]    CSN: [id number] Arrival date & time: 04/04/18  1122     History   Chief Complaint Chief Complaint  Patient presents with  . Muscle Pain    HPI Julie King is a 27 y.o. female.   Patient is a 27 year old female with past medical history of A. fib, hypertension, QT prolongation, T wave inversion, obesity.  She presents with 1 week of intermittent left chest heaviness with pain in left ribs radiating into back and left shoulder.  This pain is worse when she moves, breathes, laughs, coughs.  She denies any injury or heavy lifting.  She has had some shortness of breath with lying on her left side and on her stomach.  She denies any recent illness with cough, congestion, fever. She denies any palpitations.  She denies any calf pain or swelling.  She denies any recent long distance traveling or hormone replacement.   ROS per HPI     Past Medical History:  Diagnosis Date  . Atrial fibrillation ([place])   . Headache 07/25/2017  . Hypertension   . Irritable bowel syndrome   . Morbidly obese ([place] ) 07/25/2017  . QT prolongation   . T wave inversion in EKG     Patient Active Problem List   Diagnosis Date Noted  . Headache 07/25/2017  . Morbidly obese ([place]) 07/25/2017    Past Surgical History:  Procedure Laterality Date  . CESAREAN SECTION    . CHOLECYSTECTOMY    . HERNIA REPAIR    . TONSILLECTOMY      OB History    Gravida  1   Para  1   Term  1   Preterm      AB      Living  1     SAB      TAB      Ectopic      Multiple      Live Births  1            Home Medications    Prior to Admission medications   Medication Sig Start Date End Date Taking? Authorizing Provider  aspirin EC 325 MG tablet Take 1 tablet (325 mg total) by mouth daily. 04/21/17  Yes Varney Biles, MD  metoprolol tartrate (LOPRESSOR) 25 MG tablet Take 0.5 tablets (12.5 mg total) by mouth 2 (two) times daily. 09/01/17  Yes Sherran Needs, NP  albuterol  (PROVENTIL HFA;VENTOLIN HFA) 108 (90 Base) MCG/ACT inhaler Inhale 2 puffs into the lungs every 6 (six) hours as needed for wheezing or shortness of breath.     [provider]  aspirin-acetaminophen-caffeine (EXCEDRIN EXTRA STRENGTH) [phone] MG tablet Take 2 tablets by mouth every 4 (four) hours as needed for headache or migraine.    [provider]  chlorhexidine (PERIDEX) 0.12 % solution Use as directed 15 mLs in the mouth or throat 2 (two) times daily. 03/16/18   Ashley Murrain, NP  clindamycin (CLEOCIN) 300 MG capsule Take 1 capsule (300 mg total) by mouth 3 (three) times daily. 03/16/18   Ashley Murrain, NP  cyclobenzaprine (FLEXERIL) 10 MG tablet Take 1 tablet (10 mg total) by mouth 2 (two) times daily as needed for muscle spasms. 04/04/18   Loura Halt A, NP  naproxen (NAPROSYN) 500 MG tablet Take 1 tablet (500 mg total) by mouth 2 (two) times daily. 04/04/18   Orvan July, NP    Family History Family History  Problem Relation Age of Onset  .  Hypertension Other   . Diabetes Other   . Crohn's disease Mother   . Hypertension Mother   . Thyroid disease Father   . Diabetes Father   . Breast cancer Maternal Aunt        late 104 early 46s  . Breast cancer Maternal Grandmother        late 31s early 64    Social History Social History   Tobacco Use  . Smoking status: Current Every Day Smoker    Packs/day: 0.25    Types: Cigarettes  . Smokeless tobacco: Never Used  Substance Use Topics  . Alcohol use: Yes    Comment: Twice every 6 months   . Drug use: No     Allergies   Percocet [oxycodone-acetaminophen] and Pineapple   Review of Systems Review of Systems   Physical Exam Triage Vital Signs ED Triage Vitals [04/04/18 1128]  Enc Vitals Group     BP (!) 141/86     Pulse Rate 97     Resp      Temp 98.7 F (37.1 C)     Temp Source Oral     SpO2 100 %     Weight      Height      Head Circumference      Peak Flow      Pain Score 6     Pain Loc       Pain Edu?      Excl. in [place]?    No data found.  Updated Vital Signs BP (!) 141/86 (BP Location: Left Wrist)   Pulse 97   Temp 98.7 F (37.1 C) (Oral)   LMP 03/17/2018 (Approximate)   SpO2 100%   Visual Acuity Right Eye Distance:   Left Eye Distance:   Bilateral Distance:    Right Eye Near:   Left Eye Near:    Bilateral Near:     Physical Exam  Constitutional: She is oriented to person, place, and time. She appears well-developed and well-nourished.  HENT:  Head: Normocephalic and atraumatic.  Eyes: Pupils are equal, round, and reactive to light.  Neck: Normal range of motion.  Cardiovascular: Normal rate, regular rhythm and normal heart sounds.  Pulmonary/Chest: Effort normal and breath sounds normal. No respiratory distress.  Musculoskeletal: She exhibits tenderness. She exhibits no edema or deformity.  Tenderness to palpation of chest wall and trapezius muscle.  Limited range of motion of left arm due to pain and tightening of muscles.  Lymphadenopathy:    She has no cervical adenopathy.  Neurological: She is alert and oriented to person, place, and time.  Skin: Skin is warm and dry. Capillary refill takes less than 2 seconds.  Psychiatric: She has a normal mood and affect.  Nursing note and vitals reviewed.    UC Treatments / Results  Labs (all labs ordered are listed, but only abnormal results are displayed) Labs Reviewed - No data to display  EKG None  Radiology Dg Chest 2 View  Result Date: 04/04/2018 CLINICAL DATA:  Left-sided chest pain for the past week. EXAM: CHEST - 2 VIEW COMPARISON:  Chest x-ray dated Jan 02, 2018. FINDINGS: The heart size and mediastinal contours are within normal limits. Both lungs are clear. The visualized skeletal structures are unremarkable. IMPRESSION: No active cardiopulmonary disease. Electronically Signed   By: Titus Dubin M.D.   On: 04/04/2018 12:39    Procedures Procedures (including critical care time)  Medications  Ordered in UC Medications - No data to  display  Initial Impression / Assessment and Plan / UC Course  I have reviewed the triage vital signs and the nursing notes.  Pertinent labs & imaging results that were available during my care of the patient were reviewed by me and considered in my medical decision making (see chart for details).    Non concerning for PE, ACS, PNA.  EKG normal sinus rhythm.  Chest x-ray normal.  Physical exam reassuring.  Likely musculoskeletal chest pain will try muscle relaxer and naproxen to see if this helps.  Return precautions given.  She develops any worsening symptoms to include more severe chest pain, shortness of breath, dizziness she needs to go to the ER.  Final Clinical Impressions(s) / UC Diagnoses   Final diagnoses:  Muscle pain     Discharge Instructions     It was nice meeting you!!  Your x-ray and EKG were completely normal.  This is most likely musculoskeletal pain.  We will give you a small dose of muscle relaxant and Naproxen for pain and inflammation. If you develop any worsening or concerning symptoms please go to the ER.    ED Prescriptions    Medication Sig Dispense Auth. Provider   cyclobenzaprine (FLEXERIL) 10 MG tablet Take 1 tablet (10 mg total) by mouth 2 (two) times daily as needed for muscle spasms. 20 tablet Aleni Andrus A, NP   naproxen (NAPROSYN) 500 MG tablet Take 1 tablet (500 mg total) by mouth 2 (two) times daily. 30 tablet Loura Halt A, NP     Controlled Substance Prescriptions [place] Controlled Substance Registry consulted? Not Applicable   Orvan July, NP 04/04/18 1525

## 2018-04-04 NOTE — ED Triage Notes (Signed)
Pt complains of pain in her left lower chest, internally behind her ribs and in her left upper back and shoulder.  She states she only feels it when she laughs or coughs.

## 2018-04-04 NOTE — Discharge Instructions (Signed)
It was nice meeting you!!  Your x-ray and EKG were completely normal.  This is most likely musculoskeletal pain.  We will give you a small dose of muscle relaxant and Naproxen for pain and inflammation. If you develop any worsening or concerning symptoms please go to the ER.

## 2018-04-13 ENCOUNTER — Other Ambulatory Visit: Payer: Self-pay | Admitting: Hematology & Oncology

## 2018-04-13 DIAGNOSIS — R590 Localized enlarged lymph nodes: Secondary | ICD-10-CM

## 2018-04-16 ENCOUNTER — Ambulatory Visit
Admission: RE | Admit: 2018-04-16 | Discharge: 2018-04-16 | Disposition: A | Discharge status: Home / Self Care | Payer: Self-pay | Source: Ambulatory Visit | Attending: Hematology & Oncology | Admitting: Hematology & Oncology

## 2018-04-16 DIAGNOSIS — R590 Localized enlarged lymph nodes: Secondary | ICD-10-CM

## 2018-04-16 MED ORDER — IOPAMIDOL (ISOVUE-300) INJECTION 61%
125.0000 mL | Freq: Once | INTRAVENOUS | Status: AC | PRN
Start: 2018-04-16 — End: 2018-04-16
  Administered 2018-04-16: 125 mL via INTRAVENOUS

## 2018-05-14 ENCOUNTER — Emergency Department (HOSPITAL_COMMUNITY): Payer: Medicaid Other

## 2018-05-14 ENCOUNTER — Encounter (HOSPITAL_COMMUNITY): Payer: Self-pay | Admitting: Emergency Medicine

## 2018-05-14 ENCOUNTER — Emergency Department (HOSPITAL_COMMUNITY)
Admission: EM | Admit: 2018-05-14 | Discharge: 2018-05-14 | Disposition: A | Discharge status: Home / Self Care | Payer: Medicaid Other | Attending: Emergency Medicine | Admitting: Emergency Medicine

## 2018-05-14 ENCOUNTER — Other Ambulatory Visit: Payer: Self-pay

## 2018-05-14 DIAGNOSIS — M546 Pain in thoracic spine: Secondary | ICD-10-CM | POA: Insufficient documentation

## 2018-05-14 DIAGNOSIS — Z79899 Other long term (current) drug therapy: Secondary | ICD-10-CM | POA: Insufficient documentation

## 2018-05-14 DIAGNOSIS — F17210 Nicotine dependence, cigarettes, uncomplicated: Secondary | ICD-10-CM | POA: Insufficient documentation

## 2018-05-14 DIAGNOSIS — I10 Essential (primary) hypertension: Secondary | ICD-10-CM | POA: Insufficient documentation

## 2018-05-14 LAB — CBC
HEMATOCRIT: 37.2 % (ref 36.0–46.0)
Hemoglobin: 11.2 g/dL — ABNORMAL LOW (ref 12.0–15.0)
MCH: 19.2 pg — ABNORMAL LOW (ref 26.0–34.0)
MCHC: 30.1 g/dL (ref 30.0–36.0)
MCV: 63.8 fL — ABNORMAL LOW (ref 78.0–100.0)
Platelets: 220 10*3/uL (ref 150–400)
RBC: 5.83 MIL/uL — AB (ref 3.87–5.11)
RDW: 17.7 % — ABNORMAL HIGH (ref 11.5–15.5)
WBC: 13.9 10*3/uL — AB (ref 4.0–10.5)

## 2018-05-14 LAB — BASIC METABOLIC PANEL
Anion gap: 10 (ref 5–15)
BUN: 14 mg/dL (ref 6–20)
CHLORIDE: 107 mmol/L (ref 98–111)
CO2: 22 mmol/L (ref 22–32)
Calcium: 8.9 mg/dL (ref 8.9–10.3)
Creatinine, Ser: 0.73 mg/dL (ref 0.44–1.00)
GFR calc Af Amer: >60 mL/min (ref >60)
GFR calc non Af Amer: >60 mL/min (ref >60)
Glucose, Bld: 111 mg/dL — ABNORMAL HIGH (ref 70–99)
Potassium: 3.8 mmol/L (ref 3.5–5.1)
Sodium: 139 mmol/L (ref 135–145)

## 2018-05-14 LAB — I-STAT TROPONIN, ED: TROPONIN I, POC: 0 ng/mL (ref 0.00–0.08)

## 2018-05-14 LAB — I-STAT BETA HCG BLOOD, ED (MC, WL, AP ONLY): I-stat hCG, quantitative: <5 m[IU]/mL (ref <5)

## 2018-05-14 LAB — URINALYSIS, ROUTINE W REFLEX MICROSCOPIC
BILIRUBIN URINE: NEGATIVE
Glucose, UA: NEGATIVE mg/dL
Hgb urine dipstick: NEGATIVE
KETONES UR: NEGATIVE mg/dL
Leukocytes, UA: NEGATIVE
NITRITE: NEGATIVE
PH: 6 (ref 5.0–8.0)
PROTEIN: NEGATIVE mg/dL
Specific Gravity, Urine: 1.032 — ABNORMAL HIGH (ref 1.005–1.030)

## 2018-05-14 MED ORDER — LIDOCAINE 5 % EX PTCH: 1.0000 | MEDICATED_PATCH | CUTANEOUS | 0 refills | Status: DC

## 2018-05-14 MED ORDER — SODIUM CHLORIDE 0.9 % IV BOLUS: 1000.0000 mL | Freq: Once | INTRAVENOUS | Status: DC

## 2018-05-14 MED ORDER — METHOCARBAMOL 500 MG PO TABS: 500.0000 mg | ORAL_TABLET | Freq: Two times a day (BID) | ORAL | 0 refills | Status: DC

## 2018-05-14 NOTE — Discharge Instructions (Addendum)
Expect your soreness to increase over the next 2-3 days. Take it easy, but do not lay around too much as this may make any stiffness worse.  Antiinflammatory medications: Take 600 mg of ibuprofen every 6 hours or 440 mg (over the counter dose) to 500 mg (prescription dose) of naproxen every 12 hours for the next 3 days. After this time, these medications may be used as needed for pain. Take these medications with food to avoid upset stomach. Choose only one of these medications, do not take them together. Acetaminophen (generic for Tylenol): Should you continue to have additional pain while taking the ibuprofen or naproxen, you may add in acetaminophen as needed. Your daily total maximum amount of acetaminophen from all sources should be limited to 4000mg /day for persons without liver problems, or 2000mg /day for those with liver problems. Muscle relaxer: Robaxin is a muscle relaxer and may help loosen stiff muscles. Do not take the Robaxin while driving or performing other dangerous activities.  Lidocaine patches: These are available via either prescription or over-the-counter. The over-the-counter option may be more economical one and are likely just as effective. There are multiple over-the-counter brands, such as Salonpas. Exercises: Be sure to perform the attached exercises starting with three times a week and working up to performing them daily. This is an essential part of preventing long term problems.  Follow up: Follow up with a primary care provider for any future management of these complaints. Be sure to follow up within 7-10 days. Return: Return to the ED should symptoms worsen.  Due to circumstances, we were unable to finish your work-up.  You are welcome to return at any time.

## 2018-05-14 NOTE — ED Provider Notes (Signed)
[facility] EMERGENCY DEPARTMENT Provider Note   CSN: [id number] Arrival date & time: 05/14/18  0243     History   Chief Complaint Chief Complaint  Patient presents with  . Back Pain  . Rash  . Shortness of Breath    HPI Julie King is a 27 y.o. female.  HPI   Julie King is a 27 y.o. female, with a history of A. fib, HTN, and morbid obesity, presenting to the ED with right mid back pain noted upon waking the morning of September 15.  Pain is sharp, moderate to severe, nonradiating.  Worse with deep inspiration, palpation, and movement.  Accompanied by nausea and occasional shortness of breath due to pain.  Denies fever/chills, vomiting, diarrhea, urinary symptoms, abdominal pain, chest pain, syncope, known injury, or any other complaints.   Past Medical History:  Diagnosis Date  . Atrial fibrillation ([place])   . Headache 07/25/2017  . Hypertension   . Irritable bowel syndrome   . Morbidly obese ([place]) 07/25/2017  . QT prolongation   . T wave inversion in EKG     Patient Active Problem List   Diagnosis Date Noted  . Headache 07/25/2017  . Morbidly obese ([place]) 07/25/2017    Past Surgical History:  Procedure Laterality Date  . CESAREAN SECTION    . CHOLECYSTECTOMY    . HERNIA REPAIR    . TONSILLECTOMY       OB History    Gravida  1   Para  1   Term  1   Preterm      AB      Living  1     SAB      TAB      Ectopic      Multiple      Live Births  1            Home Medications    Prior to Admission medications   Medication Sig Start Date End Date Taking? Authorizing Provider  albuterol (PROVENTIL HFA;VENTOLIN HFA) 108 (90 Base) MCG/ACT inhaler Inhale 2 puffs into the lungs every 6 (six) hours as needed for wheezing or shortness of breath.     [provider]  aspirin EC 325 MG tablet Take 1 tablet (325 mg total) by mouth daily. 04/21/17   Varney Biles, MD  aspirin-acetaminophen-caffeine (EXCEDRIN  EXTRA STRENGTH) [phone] MG tablet Take 2 tablets by mouth every 4 (four) hours as needed for headache or migraine.    [provider]  chlorhexidine (PERIDEX) 0.12 % solution Use as directed 15 mLs in the mouth or throat 2 (two) times daily. 03/16/18   Ashley Murrain, NP  clindamycin (CLEOCIN) 300 MG capsule Take 1 capsule (300 mg total) by mouth 3 (three) times daily. 03/16/18   Ashley Murrain, NP  cyclobenzaprine (FLEXERIL) 10 MG tablet Take 1 tablet (10 mg total) by mouth 2 (two) times daily as needed for muscle spasms. 04/04/18   Bast, Tressia Miners A, NP  lidocaine (LIDODERM) 5 % Place 1 patch onto the skin daily. Remove & Discard patch within 12 hours or as directed by MD 05/14/18   Joy, Shawn C, PA-C  methocarbamol (ROBAXIN) 500 MG tablet Take 1 tablet (500 mg total) by mouth 2 (two) times daily. 05/14/18   Joy, Shawn C, PA-C  metoprolol tartrate (LOPRESSOR) 25 MG tablet Take 0.5 tablets (12.5 mg total) by mouth 2 (two) times daily. 09/01/17   Sherran Needs, NP  naproxen (NAPROSYN) 500 MG tablet  Take 1 tablet (500 mg total) by mouth 2 (two) times daily. 04/04/18   Orvan July, NP    Family History Family History  Problem Relation Age of Onset  . Hypertension Other   . Diabetes Other   . Crohn's disease Mother   . Hypertension Mother   . Thyroid disease Father   . Diabetes Father   . Breast cancer Maternal Aunt        late 33 early 32s  . Breast cancer Maternal Grandmother        late 66s early 85    Social History Social History   Tobacco Use  . Smoking status: Current Every Day Smoker    Packs/day: 0.25    Types: Cigarettes  . Smokeless tobacco: Never Used  Substance Use Topics  . Alcohol use: Yes    Comment: Twice every 6 months   . Drug use: No     Allergies   Percocet [oxycodone-acetaminophen] and Pineapple   Review of Systems Review of Systems  Constitutional: Negative for chills, diaphoresis and fever.  Respiratory: Positive for shortness of breath. Negative  for cough.   Cardiovascular: Negative for chest pain, palpitations and leg swelling.  Gastrointestinal: Positive for nausea. Negative for abdominal pain, blood in stool, diarrhea and vomiting.  Musculoskeletal: Positive for back pain.  Neurological: Negative for weakness.  All other systems reviewed and are negative.    Physical Exam Updated Vital Signs BP (!) 133/96   Pulse (!) 106   Temp 98.7 F (37.1 C)   Resp 18   LMP 04/17/2018   SpO2 98%   Physical Exam  Constitutional: She appears well-developed and well-nourished. No distress.  HENT:  Head: Normocephalic and atraumatic.  Eyes: Conjunctivae are normal.  Neck: Neck supple.  Cardiovascular: Normal rate, regular rhythm, normal heart sounds and intact distal pulses.  Not tachycardic upon my exam.  Pulmonary/Chest: Effort normal and breath sounds normal. No respiratory distress.      No increased work of breathing.  Speaks in full sentences without difficulty.  Abdominal: Soft. There is no tenderness. There is no guarding.  Musculoskeletal: She exhibits no edema.  Lymphadenopathy:    She has no cervical adenopathy.  Neurological: She is alert.  Skin: Skin is warm and dry. She is not diaphoretic.  Psychiatric: She has a normal mood and affect. Her behavior is normal.  Nursing note and vitals reviewed.    ED Treatments / Results  Labs (all labs ordered are listed, but only abnormal results are displayed) Labs Reviewed  BASIC METABOLIC PANEL - Abnormal; Notable for the following components:      Result Value   Glucose, Bld 111 (*)    All other components within normal limits  CBC - Abnormal; Notable for the following components:   WBC 13.9 (*)    RBC 5.83 (*)    Hemoglobin 11.2 (*)    MCV 63.8 (*)    MCH 19.2 (*)    RDW 17.7 (*)    All other components within normal limits  URINALYSIS, ROUTINE W REFLEX MICROSCOPIC - Abnormal; Notable for the following components:   Specific Gravity, Urine 1.032 (*)    All  other components within normal limits  I-STAT BETA HCG BLOOD, ED ([facility], [facility], [facility] ONLY)  I-STAT TROPONIN, ED  CBG MONITORING, ED    EKG EKG Interpretation  Date/Time:  Monday May 14 2018 02:54:18 EDT Ventricular Rate:  97 PR Interval:  130 QRS Duration: 90 QT Interval:  360 QTC Calculation:  457 R Axis:   63 Text Interpretation:  Normal sinus rhythm Septal infarct , age undetermined Abnormal ECG No significant change since last tracing Confirmed by Pryor Curia [phone]) on 05/14/2018 5:11:42 AM   Radiology Dg Chest 2 View  Result Date: 05/14/2018 CLINICAL DATA:  Shortness of breath and flank pain tonight. History of systemic lymphadenopathy. EXAM: CHEST - 2 VIEW COMPARISON:  CT chest April 16, 2018 FINDINGS: Cardiomediastinal silhouette is normal. No pleural effusions or focal consolidations. Trachea projects midline and there is no pneumothorax. Soft tissue planes and included osseous structures are non-suspicious. Mild RIGHT AC osteoarthrosis. Large body habitus. IMPRESSION: Negative. Electronically Signed   By: Elon Alas M.D.   On: 05/14/2018 03:26    Procedures Procedures (including critical care time)  Medications Ordered in ED Medications  sodium chloride 0.9 % bolus 1,000 mL (has no administration in time range)     Initial Impression / Assessment and Plan / ED Course  I have reviewed the triage vital signs and the nursing notes.  Pertinent labs & imaging results that were available during my care of the patient were reviewed by me and considered in my medical decision making (see chart for details).     Patient presents with right thoracic back pain.  Due to the way the patient described the onset of the pain, the way it feels, and the patient's initially documented tachycardia, I had a more in-depth work-up ordered.  However, during her ED course, patient stated, "I do not think I need all that.  I think I am fine."  She requested discharge.  Stated she would  follow-up with her PCP.  Patient is nontoxic appearing, afebrile, not tachycardic on my exam, not tachypneic, not hypotensive, maintains excellent SPO2 on room air, and is in no apparent distress.  The patient was given instructions for home care as well as return precautions. Patient voices understanding of these instructions, accepts the plan, and is comfortable with discharge.   Vitals:   05/14/18 0249 05/14/18 0457 05/14/18 0458  BP: (!) 133/96 109/70   Pulse: (!) 106  73  Resp: 18    Temp: 98.7 F (37.1 C)    SpO2: 98%  100%     Final Clinical Impressions(s) / ED Diagnoses   Final diagnoses:  Acute right-sided thoracic back pain    ED Discharge Orders         Ordered    methocarbamol (ROBAXIN) 500 MG tablet  2 times daily     05/14/18 0501    lidocaine (LIDODERM) 5 %  Every 24 hours     05/14/18 0501           Lorayne Bender, PA-C 05/14/18 [place], Delice Bison, DO 05/14/18 [phone]

## 2018-05-14 NOTE — ED Triage Notes (Addendum)
C/o R sided thoracic back pain since yesterday that is worse when lying down or deep inspiration.  Generalized rash x 1 month.  Pt also reports intermittent SOB, increased thirst, and decreased urination.

## 2018-05-14 NOTE — ED Notes (Signed)
Pt stated she had to go and get her child ready for school, provider notified and bedside.  Pt educated on risks and promised to come back should she need to.  Reviewed prescription.

## 2018-05-21 ENCOUNTER — Encounter (HOSPITAL_COMMUNITY): Payer: Self-pay | Admitting: Emergency Medicine

## 2018-05-21 ENCOUNTER — Ambulatory Visit (HOSPITAL_COMMUNITY)
Admission: EM | Admit: 2018-05-21 | Discharge: 2018-05-21 | Disposition: A | Discharge status: Home / Self Care | Payer: Medicaid Other | Attending: Family Medicine | Admitting: Family Medicine

## 2018-05-21 DIAGNOSIS — K0889 Other specified disorders of teeth and supporting structures: Secondary | ICD-10-CM

## 2018-05-21 MED ORDER — IBUPROFEN 800 MG PO TABS: 800.0000 mg | ORAL_TABLET | Freq: Three times a day (TID) | ORAL | 0 refills | Status: DC

## 2018-05-21 MED ORDER — AMOXICILLIN-POT CLAVULANATE 875-125 MG PO TABS: 1.0000 | ORAL_TABLET | Freq: Two times a day (BID) | ORAL | 0 refills | Status: AC

## 2018-05-21 NOTE — ED Triage Notes (Signed)
Pt c/o jaw and tooth pain on left side, states it comes and goes.

## 2018-05-21 NOTE — Discharge Instructions (Signed)
Ice application, ibuprofen for pain control.  Complete course of antibiotics.   Please follow up with dentist for definitive treatment.

## 2018-05-21 NOTE — ED Provider Notes (Signed)
[facility]    CSN: [id number] Arrival date & time: 05/21/18  [place]     History   Chief Complaint Chief Complaint  Patient presents with  . Dental Pain    HPI Julie King is a 27 y.o. female.   Julie King presents with complaints of left lower dental pain which has recurred, worse since last night. No sleep last night. No drainage or poor taste in mouth. States just got insurance again therefore will be able to see a dentist, has not seen yet. Pain 10/10. Has been taking excedrin which has not helped. Mild facial swelling. States had similar approximately a month ago and antibiotics helped. Hx of afib, htn, ibs, headache.      Past Medical History:  Diagnosis Date  . Atrial fibrillation ([place])   . Headache 07/25/2017  . Hypertension   . Irritable bowel syndrome   . Morbidly obese ([place]) 07/25/2017  . QT prolongation   . T wave inversion in EKG     Patient Active Problem List   Diagnosis Date Noted  . Headache 07/25/2017  . Morbidly obese ([place]) 07/25/2017    Past Surgical History:  Procedure Laterality Date  . CESAREAN SECTION    . CHOLECYSTECTOMY    . HERNIA REPAIR    . TONSILLECTOMY      OB History    Gravida  1   Para  1   Term  1   Preterm      AB      Living  1     SAB      TAB      Ectopic      Multiple      Live Births  1            Home Medications    Prior to Admission medications   Medication Sig Start Date End Date Taking? Authorizing Provider  albuterol (PROVENTIL HFA;VENTOLIN HFA) 108 (90 Base) MCG/ACT inhaler Inhale 2 puffs into the lungs every 6 (six) hours as needed for wheezing or shortness of breath.     [provider]  amoxicillin-clavulanate (AUGMENTIN) 875-125 MG tablet Take 1 tablet by mouth every 12 (twelve) hours for 7 days. 05/21/18 05/28/18  Zigmund Gottron, NP  aspirin-acetaminophen-caffeine (EXCEDRIN EXTRA STRENGTH) [phone] MG tablet Take 2 tablets by mouth every 4 (four) hours as  needed for headache or migraine.    [provider]  ibuprofen (ADVIL,MOTRIN) 800 MG tablet Take 1 tablet (800 mg total) by mouth 3 (three) times daily. 05/21/18   Zigmund Gottron, NP    Family History Family History  Problem Relation Age of Onset  . Hypertension Other   . Diabetes Other   . Crohn's disease Mother   . Hypertension Mother   . Thyroid disease Father   . Diabetes Father   . Breast cancer Maternal Aunt        late 73 early 58s  . Breast cancer Maternal Grandmother        late 56s early 48    Social History Social History   Tobacco Use  . Smoking status: Current Every Day Smoker    Packs/day: 0.25    Types: Cigarettes  . Smokeless tobacco: Never Used  Substance Use Topics  . Alcohol use: Yes    Comment: Twice every 6 months   . Drug use: No     Allergies   Percocet [oxycodone-acetaminophen] and Pineapple   Review of Systems Review of Systems   Physical Exam  Triage Vital Signs ED Triage Vitals [05/21/18 1817]  Enc Vitals Group     BP (!) 155/87     Pulse Rate 99     Resp 18     Temp 99.1 F (37.3 C)     Temp src      SpO2 100 %     Weight      Height      Head Circumference      Peak Flow      Pain Score      Pain Loc      Pain Edu?      Excl. in [place]?    No data found.  Updated Vital Signs BP (!) 155/87   Pulse 99   Temp 99.1 F (37.3 C)   Resp 18   LMP 04/20/2018   SpO2 100%    Physical Exam  Constitutional: She is oriented to person, place, and time. She appears well-developed and well-nourished. No distress.  HENT:  Mouth/Throat: No trismus in the jaw. No uvula swelling.  Broken tooth noted #18 with significant tenderness at jaw line underlying; no visible abscess  Cardiovascular: Normal rate, regular rhythm and normal heart sounds.  Pulmonary/Chest: Effort normal and breath sounds normal.  Neurological: She is alert and oriented to person, place, and time.  Skin: Skin is warm and dry.     UC Treatments /  Results  Labs (all labs ordered are listed, but only abnormal results are displayed) Labs Reviewed - No data to display  EKG None  Radiology No results found.  Procedures Procedures (including critical care time)  Medications Ordered in UC Medications - No data to display  Initial Impression / Assessment and Plan / UC Course  I have reviewed the triage vital signs and the nursing notes.  Pertinent labs & imaging results that were available during my care of the patient were reviewed by me and considered in my medical decision making (see chart for details).     Consistent with dental pain. No red flag findings. Non toxic. Antibiotics, nsaids provided. Follow up with dentist. Patient verbalized understanding and agreeable to plan.    Final Clinical Impressions(s) / UC Diagnoses   Final diagnoses:  Pain, dental     Discharge Instructions     Ice application, ibuprofen for pain control.  Complete course of antibiotics.   Please follow up with dentist for definitive treatment.    ED Prescriptions    Medication Sig Dispense Auth. Provider   amoxicillin-clavulanate (AUGMENTIN) 875-125 MG tablet Take 1 tablet by mouth every 12 (twelve) hours for 7 days. 14 tablet Augusto Gamble B, NP   ibuprofen (ADVIL,MOTRIN) 800 MG tablet Take 1 tablet (800 mg total) by mouth 3 (three) times daily. 21 tablet Zigmund Gottron, NP     Controlled Substance Prescriptions [place] Controlled Substance Registry consulted? Not Applicable   Zigmund Gottron, NP 05/21/18 [phone]

## 2018-08-29 DIAGNOSIS — I4891 Unspecified atrial fibrillation: Secondary | ICD-10-CM

## 2018-08-29 HISTORY — DX: Unspecified atrial fibrillation: I48.91

## 2018-09-05 ENCOUNTER — Ambulatory Visit (INDEPENDENT_AMBULATORY_CARE_PROVIDER_SITE_OTHER): Payer: Self-pay | Admitting: Pulmonary Disease

## 2018-09-05 ENCOUNTER — Encounter: Payer: Self-pay | Admitting: Pulmonary Disease

## 2018-09-05 VITALS — BP 126/80 | HR 115 | Temp 98.5°F | Ht 67.0 in | Wt 291.2 lb

## 2018-09-05 DIAGNOSIS — I4891 Unspecified atrial fibrillation: Secondary | ICD-10-CM

## 2018-09-05 DIAGNOSIS — J418 Mixed simple and mucopurulent chronic bronchitis: Secondary | ICD-10-CM

## 2018-09-05 MED ORDER — ALBUTEROL SULFATE HFA 108 (90 BASE) MCG/ACT IN AERS: 2.0000 | INHALATION_SPRAY | Freq: Four times a day (QID) | RESPIRATORY_TRACT | 6 refills | Status: DC | PRN

## 2018-09-05 NOTE — Patient Instructions (Addendum)
Obstructive Lung Disease May be related to undiagnosed asthma. --ORDER Pulmonary function tests to evaluate for obstructive and restrictive lung abnormalities --REFILL albuterol inhaler  Atrial fibrillation --Continue ASA daily --Will refer to Cardiology

## 2018-09-05 NOTE — Progress Notes (Signed)
Synopsis: Referred in 08/2018 for cough and hx abnormal CT with GGO  Subjective:   PATIENT ID: Julie King GENDER: female DOB: December 02, 1990, MRN: [id number]   HPI  Chief Complaint  Patient presents with  . Consult    persistent cough - hx ground glass    [name] is a 28 year old female with BMI 45.6, atrial fibrillation who presents as a new consultation with cough and history of abnormal chest imagingreportedly demonstrating groundglass opacities.  For the last few years, she has had a chronic cough that has gradually worsened in the last month. Cough occurs throughout the day and night.  She describes it as a dry, hacking cough. Sometimes productive thin secretions but had single episode of hemoptysis a few months ago.  Aggravated with episodes of bronchitis which she has three times a year, laying down and sometimes laughing. Has been treated with inhalers, steroids and antibiotics. Unsure if they help her cough.   Previously had PFT[facility] at [facility] which she "failed" and was prescribed inhalers but did not fill due to insurance.   No childhood asthma. Born to term.   Social History:  Started smoking 10 years ago.  Currently smoking 1/2 ppd. 5-pack-year smoking history  Environmental exposures: Exposed to outdoor and indoor grilling and smoker daily until winter  I have personally reviewed patient's past medical/family/social history, allergies, current medications.  Past Medical History:  Diagnosis Date  . Atrial fibrillation ([place])   . Headache 07/25/2017  . Hypertension   . Irritable bowel syndrome   . Morbidly obese ([place]) 07/25/2017  . QT prolongation   . T wave inversion in EKG      Family History  Problem Relation Age of Onset  . Hypertension Other   . Diabetes Other   . Crohn's disease Mother   . Hypertension Mother   . Thyroid disease Father   . Diabetes Father   . Breast cancer Maternal Aunt        late 12 early 16s  . Breast cancer Maternal  Grandmother        late 11s early 16     Social History   Occupational History  . Occupation: Barrister's clerk apartment  Tobacco Use  . Smoking status: Current Every Day Smoker    Packs/day: 0.50    Years: 10.00    Pack years: 5.00    Types: Cigarettes  . Smokeless tobacco: Never Used  . Tobacco comment: wants to quit but states very difficult Jan 2020  Substance and Sexual Activity  . Alcohol use: Yes    Comment: Twice every 6 months   . Drug use: No  . Sexual activity: Not Currently    Comment: with female    Allergies  Allergen Reactions  . Percocet [Oxycodone-Acetaminophen] Hives, Itching and Nausea Only  . Pineapple Itching    Fresh pineapples; pineapples from a can is okay     Outpatient Medications Prior to Visit  Medication Sig Dispense Refill  . aspirin-acetaminophen-caffeine (EXCEDRIN EXTRA STRENGTH) 250-250-65 MG tablet Take 2 tablets by mouth every 4 (four) hours as needed for headache or migraine. As needed for headaches    . ibuprofen (ADVIL,MOTRIN) 800 MG tablet Take 1 tablet (800 mg total) by mouth 3 (three) times daily. 21 tablet 0  . albuterol (PROVENTIL HFA;VENTOLIN HFA) 108 (90 Base) MCG/ACT inhaler Inhale 2 puffs into the lungs every 6 (six) hours as needed for wheezing or shortness of breath.      No facility-administered medications prior  to visit.     Review of Systems  Constitutional: Negative for chills, diaphoresis, fever, malaise/fatigue and weight loss.  HENT: Negative for congestion, sinus pain and sore throat.   Respiratory: Positive for cough, sputum production, shortness of breath and wheezing. Negative for hemoptysis.   Cardiovascular: Positive for leg swelling (Mild). Negative for chest pain, orthopnea and PND.  Gastrointestinal: Negative for abdominal pain, heartburn and nausea.  Genitourinary: Negative for frequency.  Musculoskeletal: Negative for myalgias.  Skin: Negative for rash.  Neurological: Negative for dizziness, weakness and  headaches.  Endo/Heme/Allergies: Does not bruise/bleed easily.     Objective:   Vitals:   09/05/18 1034  BP: 126/80  Pulse: (!) 115  Temp: 98.5 F (36.9 C)  SpO2: 100%  Weight: 291 lb 3.2 oz (132.1 kg)  Height: 5\' 7"  (1.702 m)   SpO2: 100 % O2 Device: None (Room air)  Physical Exam General: BMI 41.6, well-appearing, no acute distress HENT: [place], AT Eyes: EOMI, no scleral icterus Respiratory: Clear to auscultation bilaterally.  No crackles, wheezing or rales Cardiovascular: RRR, -M/R/G, no JVD GI: BS+, soft, nontender Extremities:-Edema,-tenderness Neuro: AAO x4, CNII-XII grossly intact Skin: Intact, no rashes or bruising Psych: Normal mood, normal affect  Chest imaging: CT chest 04/16/2018-normal lung fields without evidence of groundglass opacity, infiltrate, effusion or edema.  No adenopathy noted  CXR 05/14/2018-normal x-ray.  No acute cardio abnormality  PFT: PFT 2017- Ratio 77, FEV1 3.72 (88%), FVC 3.72 (88%)  Imaging, labs and test noted above have been reviewed independently by me.    Assessment & Plan:   Chronic bronchitis Obstructive lung disease Atrial fibrillation  Discussion: 28 year old female with obstructive lung disease likely asthma who presents for asthma management.  Has had difficulty filling her prescriptions due to financial hardship.  She has a child with special needs with seizures and often does not prioritize her own health issues.  Obstructive Lung Disease May be related to undiagnosed asthma. --ORDER Pulmonary function tests to evaluate for obstructive and restrictive lung abnormalities --REFILL albuterol inhaler  Atrial fibrillation --Continue ASA daily --Will refer to Cardiology    Orders Placed This Encounter  Procedures  . Ambulatory referral to Cardiology    Referral Priority:   Routine    Referral Type:   Consultation    Referral Reason:   Specialty Services Required    Requested Specialty:   Cardiology    Number of  Visits Requested:   1  . AMB Referral to [facility]    Referral Priority:   Routine    Referral Type:   Consultation    Referral Reason:   [facility]    Number of Visits Requested:   1  . Pulmonary Function Test    Standing Status:   Future    Standing Expiration Date:   09/06/2019    Scheduling Instructions:     Schedule as soon as possible    Order Specific Question:   Where should this test be performed?    Answer:   [facility]    Order Specific Question:   Full PFT: includes the following: basic spirometry, spirometry pre & post bronchodilator, diffusion capacity (DLCO), lung volumes    Answer:   Full PFT    Order Specific Question:   MIP/MEP    Answer:   No    Order Specific Question:   6 minute walk    Answer:   No    Order Specific Question:   ABG    Answer:  No    Order Specific Question:   Diffusion capacity (DLCO)    Answer:   Yes    Order Specific Question:   Lung volumes    Answer:   Yes    Order Specific Question:   Methacholine challenge    Answer:   No   Meds ordered this encounter  Medications  . albuterol (PROVENTIL HFA;VENTOLIN HFA) 108 (90 Base) MCG/ACT inhaler    Sig: Inhale 2 puffs into the lungs every 6 (six) hours as needed for wheezing or shortness of breath.    Dispense:  1 Inhaler    Refill:  6    Return in about 3 months (around 12/05/2018).  Sarai January Rodman Pickle, MD [facility] 09/05/2018 8:10 PM  Personal pager: [phone] If unanswered, please page CCM On-call: [phone]

## 2018-09-06 ENCOUNTER — Telehealth: Payer: Self-pay | Admitting: Pulmonary Disease

## 2018-09-06 MED ORDER — PREDNISONE 10 MG PO TABS: ORAL_TABLET | ORAL | 0 refills | Status: DC

## 2018-09-06 NOTE — Telephone Encounter (Signed)
Pulmonary Telephone Encounter I called and spoke with patient. Symptoms concerning for asthma exacerbation. On the phone she is speaking in full sentences with intermittent cough and wheeze.  Asthma exacerbation --ORDER Prednisone as follows: 60 x3days >>50mg  x3d >>40mg  x3d >>30mg  x3d >>20mg  x3d >>10mg  x3d >>STOP  --RESCHEDULE PFTs WITH FOLLOW-UP with NP in 2 weeks  Patient was advised to present to ED for evaluation if symptoms progressed despite therapy.  Rodman Pickle, M.D. [facility] Pulmonary/Critical Care Medicine Pager: [phone] After hours pager: [phone]

## 2018-09-06 NOTE — Telephone Encounter (Signed)
Dr Loanne Drilling is in the office today , can you touch base with her

## 2018-09-06 NOTE — Telephone Encounter (Signed)
Previous message routed to staff.

## 2018-09-06 NOTE — Telephone Encounter (Signed)
Called and left message for Patient to call back to reschedule her PFT (scheduled for 09/06/18) and NP appointment for 2 weeks.  Prednisone taper sent to requested pharmacy. Patient previously talk with Dr. Loanne Drilling.  Per Dr. Loanne Drilling       09/06/18 11:48 AM  Note    Pulmonary Telephone Encounter I called and spoke with patient. Symptoms concerning for asthma exacerbation. On the phone she is speaking in full sentences with intermittent cough and wheeze.  Asthma exacerbation --ORDER Prednisone as follows: 60 x3days >>50mg  x3d >>40mg  x3d >>30mg  x3d >>20mg  x3d >>10mg  x3d >>STOP  --RESCHEDULE PFTs WITH FOLLOW-UP with NP in 2 weeks  Patient was advised to present to ED for evaluation if symptoms progressed despite therapy.  Rodman Pickle, M.D.

## 2018-09-06 NOTE — Telephone Encounter (Signed)
Called and spoke to pt.  Pt states this morning she developed increased sob with exertion and laying flat.  Pt states she has taken 4 puffs of albuterol with 20 minutes with no relief in sob.  Pt also reports of wheezing & dry cough at times prod with yellow mucus- cough and wheezing were addressed with Dr. Loanne Drilling yesterday at [place] per pt. Pt also reports of left shoulder pain that developed last night. Pt states pain increases with sob.  Pt has declined appt. Pt is requesting recommendations.   TP please advise. Thanks

## 2018-09-06 NOTE — Addendum Note (Signed)
Addended by: Elton Sin on: 09/06/2018 12:30 PM   Modules accepted: Orders

## 2018-09-06 NOTE — Telephone Encounter (Signed)
Dr. Ellison - please advise. Thanks. 

## 2018-09-10 ENCOUNTER — Telehealth: Payer: Self-pay

## 2018-09-10 ENCOUNTER — Telehealth: Payer: Self-pay | Admitting: Pulmonary Disease

## 2018-09-10 ENCOUNTER — Ambulatory Visit (INDEPENDENT_AMBULATORY_CARE_PROVIDER_SITE_OTHER): Payer: Self-pay | Admitting: Pulmonary Disease

## 2018-09-10 DIAGNOSIS — J418 Mixed simple and mucopurulent chronic bronchitis: Secondary | ICD-10-CM

## 2018-09-10 LAB — PULMONARY FUNCTION TEST
DL/VA % PRED: 101 %
DL/VA: 5.23 ml/min/mmHg/L
DLCO UNC % PRED: 96 %
DLCO UNC: 27.28 ml/min/mmHg
FEF 25-75 POST: 1.43 L/s
FEF 25-75 Pre: 2.65 L/sec
FEF2575-%Change-Post: -45 %
FEF2575-%Pred-Post: 40 %
FEF2575-%Pred-Pre: 74 %
FEV1-%Change-Post: -11 %
FEV1-%Pred-Post: 90 %
FEV1-%Pred-Pre: 102 %
FEV1-Post: 2.78 L
FEV1-Pre: 3.14 L
FEV1FVC-%CHANGE-POST: 7 %
FEV1FVC-%Pred-Pre: 89 %
FEV6-%CHANGE-POST: -17 %
FEV6-%PRED-PRE: 114 %
FEV6-%Pred-Post: 94 %
FEV6-Post: 3.36 L
FEV6-Pre: 4.07 L
FEV6FVC-%PRED-POST: 100 %
FEV6FVC-%Pred-Pre: 100 %
FVC-%Change-Post: -17 %
FVC-%Pred-Post: 93 %
FVC-%Pred-Pre: 113 %
FVC-Post: 3.36 L
FVC-Pre: 4.07 L
POST FEV6/FVC RATIO: 100 %
Post FEV1/FVC ratio: 83 %
Pre FEV1/FVC ratio: 77 %
Pre FEV6/FVC Ratio: 100 %
RV % pred: 288 %
RV: 4.25 L
TLC % PRED: 148 %
TLC: 8.15 L

## 2018-09-10 NOTE — Progress Notes (Signed)
Please contact patient regarding results: noted in telephone encounter.

## 2018-09-10 NOTE — Telephone Encounter (Signed)
Attempted to contact patient re: requested referral to [facility] for financial assistance was denied.  Patient was inquiring if there was any financial assistance available to assist with CT imaging or other costly testing that may be needed.  Will attempt to call her back tomorrow.  Left message that we called.  The referral denial info states:  Unfortunately, our Care Management Team will not be able to assist but she may call [phone] if she needs further clarification.

## 2018-09-10 NOTE — Telephone Encounter (Signed)
Staff - please contact patient regarding the following results and order methacholine challenge:  PFTs demonstrate mild obstructive lung defect with no significant bronchodilator effect. No evidence of asthma on this test however this can sometimes be missed especially since albuterol was taken the day of the test.   Recommend: Methacholine challenge Continue albuterol inhaler as needed  Delani Kohli Rodman Pickle, MD [place] [facility] Critical [facility] 09/10/2018 10:21 PM  Personal pager: [phone] If unanswered, please page CCM On-call: [phone]

## 2018-09-10 NOTE — Progress Notes (Signed)
Patient completed PFT today.   Pt had a hard time with pre spiro. Pt had hard time blowing her breath all the way out to meet the requirements. Pt was able to pass two tests out of multiply attempts for pre spiro, and only one test for pleth and DLCO. Patient stated she had chest tightness, SOB and dizziness during the test.

## 2018-09-11 ENCOUNTER — Telehealth: Payer: Self-pay

## 2018-09-11 ENCOUNTER — Other Ambulatory Visit: Payer: Self-pay

## 2018-09-11 DIAGNOSIS — J418 Mixed simple and mucopurulent chronic bronchitis: Secondary | ICD-10-CM

## 2018-09-11 NOTE — Telephone Encounter (Signed)
Contacted patient per Dr. Cordelia Pen instructions as follows:  Staff - please contact patient regarding the following results and order methacholine challenge:  PFTs demonstrate mild obstructive lung defect with no significant bronchodilator effect. No evidence of asthma on this test however this can sometimes be missed especially since albuterol was taken the day of the test.  Recommend: Methacholine challenge Continue albuterol inhaler as needed  Patient notified of above information and agreed to ordering methacholine challenge.  Order placed and hospital to contact patient to schedule.  Patient is still having symptoms, using albuterol.  Advised to call us if she needs sooner follow up.  Also, patient has 'lost insurance' and [facility] request for financial assistance was placed.  Response from [facility] was that they did not accept patient for financial assistance. Advised patient when getting tests at hospital to talk with them about financial arrangements and self-pay status.  Patient acknowledged understanding of above information.

## 2018-09-12 NOTE — Progress Notes (Signed)
See phone note dated 09/11/2018  

## 2018-09-25 ENCOUNTER — Encounter (HOSPITAL_COMMUNITY): Payer: Medicaid Other

## 2018-10-01 ENCOUNTER — Encounter: Payer: Self-pay | Admitting: Cardiology

## 2018-10-02 ENCOUNTER — Ambulatory Visit: Payer: Self-pay | Admitting: Cardiology

## 2018-10-03 ENCOUNTER — Encounter: Payer: Self-pay | Admitting: Cardiology

## 2018-10-29 ENCOUNTER — Encounter (HOSPITAL_COMMUNITY): Payer: Self-pay

## 2018-10-29 ENCOUNTER — Other Ambulatory Visit: Payer: Self-pay

## 2018-10-29 ENCOUNTER — Ambulatory Visit (HOSPITAL_COMMUNITY)
Admission: EM | Admit: 2018-10-29 | Discharge: 2018-10-29 | Disposition: A | Discharge status: Home / Self Care | Payer: Medicaid Other | Attending: Internal Medicine | Admitting: Internal Medicine

## 2018-10-29 DIAGNOSIS — R05 Cough: Secondary | ICD-10-CM

## 2018-10-29 DIAGNOSIS — B9789 Other viral agents as the cause of diseases classified elsewhere: Secondary | ICD-10-CM

## 2018-10-29 DIAGNOSIS — J069 Acute upper respiratory infection, unspecified: Secondary | ICD-10-CM

## 2018-10-29 MED ORDER — PSEUDOEPH-BROMPHEN-DM 30-2-10 MG/5ML PO SYRP: 5.0000 mL | ORAL_SOLUTION | Freq: Four times a day (QID) | ORAL | 0 refills | Status: DC | PRN

## 2018-10-29 MED ORDER — CETIRIZINE HCL 10 MG PO CAPS: 10.0000 mg | ORAL_CAPSULE | Freq: Every day | ORAL | 0 refills | Status: DC

## 2018-10-29 MED ORDER — IBUPROFEN 600 MG PO TABS: 600.0000 mg | ORAL_TABLET | Freq: Four times a day (QID) | ORAL | 0 refills | Status: DC | PRN

## 2018-10-29 NOTE — Discharge Instructions (Signed)
You likely having a viral upper respiratory infection. We recommended symptom control. I expect your symptoms to start improving in the next 1-2 weeks.   1. Take a daily allergy pill/anti-histamine like Zyrtec, Claritin, or Store brand consistently for 2 weeks  2. For congestion you may try an oral decongestant like Mucinex or sudafed. You may also try intranasal flonase nasal spray or saline irrigations (neti pot, sinus cleanse)  3. For your sore throat you may try cepacol lozenges, salt water gargles, throat spray. Treatment of congestion may also help your sore throat.  4. For cough you may try Cough syrup provided or over the counter delsym, Robitussen, Mucinex DM  5. Take Tylenol or Ibuprofen to help with pain/inflammation  6. Stay hydrated, drink plenty of fluids to keep throat coated and less irritated  Honey Tea For cough/sore throat try using a honey-based tea. Use 3 teaspoons of honey with juice squeezed from half lemon. Place shaved pieces of ginger into 1/2-1 cup of water and warm over stove top. Then mix the ingredients and repeat every 4 hours as needed.

## 2018-10-29 NOTE — ED Triage Notes (Signed)
Pt coughing , headaches, chest congestion, chills, and body aches and fever. X 3 days

## 2018-10-31 NOTE — ED Provider Notes (Signed)
[place]    CSN: [id number] Arrival date & time: 10/29/18  1500     History   Chief Complaint Chief Complaint  Patient presents with  . Influenza    HPI Julie King is a 28 y.o. female history of A. fib, previous tonsillectomy, hypertension presenting today for evaluation of URI symptoms.  Patient states that she has had cough, congestion, chills, body aches and fevers.  She is also had some sneezing.  Symptoms initially began with sore throat but this has improved.  She has tried Alka-Seltzer cold and flu over-the-counter with minimal relief.  Fevers have mainly been subjective.  Denies any nausea vomiting or diarrhea.  HPI  Past Medical History:  Diagnosis Date  . Atrial fibrillation ([place])   . Headache 07/25/2017  . Hypertension   . Irritable bowel syndrome   . Morbidly obese ([place]) 07/25/2017  . QT prolongation   . T wave inversion in EKG     Patient Active Problem List   Diagnosis Date Noted  . Headache 07/25/2017  . Morbidly obese ([place]) 07/25/2017    Past Surgical History:  Procedure Laterality Date  . CESAREAN SECTION    . CHOLECYSTECTOMY    . HERNIA REPAIR    . TONSILLECTOMY      OB History    Gravida  1   Para  1   Term  1   Preterm      AB      Living  1     SAB      TAB      Ectopic      Multiple      Live Births  1            Home Medications    Prior to Admission medications   Medication Sig Start Date End Date Taking? Authorizing Provider  albuterol (PROVENTIL HFA;VENTOLIN HFA) 108 (90 Base) MCG/ACT inhaler Inhale 2 puffs into the lungs every 6 (six) hours as needed for wheezing or shortness of breath. 09/05/18   Margaretha Seeds, MD  aspirin-acetaminophen-caffeine (EXCEDRIN EXTRA STRENGTH) [phone] MG tablet Take 2 tablets by mouth every 4 (four) hours as needed for headache or migraine. As needed for headaches    [provider]  brompheniramine-pseudoephedrine-DM 30-2-10 MG/5ML syrup Take 5 mLs  by mouth 4 (four) times daily as needed. 10/29/18   Bebe Moncure C, PA-C  Cetirizine HCl 10 MG CAPS Take 1 capsule (10 mg total) by mouth daily for 10 days. 10/29/18 11/08/18  Odester Nilson C, PA-C  ibuprofen (ADVIL,MOTRIN) 600 MG tablet Take 1 tablet (600 mg total) by mouth every 6 (six) hours as needed. 10/29/18   Tysheka Fanguy C, PA-C  predniSONE (DELTASONE) 10 MG tablet 60mg x3day,50mg x3day,40mg x3day,30mg x3day,20mg x3day,10mg x3day,stop 09/06/18   Margaretha Seeds, MD    Family History Family History  Problem Relation Age of Onset  . Hypertension Other   . Diabetes Other   . Crohn's disease Mother   . Hypertension Mother   . Thyroid disease Father   . Diabetes Father   . Breast cancer Maternal Aunt        late 24 early 48s  . Breast cancer Maternal Grandmother        late 48s early 64    Social History Social History   Tobacco Use  . Smoking status: Current Every Day Smoker    Packs/day: 0.50    Years: 10.00    Pack years: 5.00    Types: Cigarettes  . Smokeless tobacco: Never  Used  . Tobacco comment: wants to quit but states very difficult Jan 2020  Substance Use Topics  . Alcohol use: Yes    Comment: Twice every 6 months   . Drug use: No     Allergies   Percocet [oxycodone-acetaminophen] and Pineapple   Review of Systems Review of Systems  Constitutional: Positive for chills and fever. Negative for activity change, appetite change and fatigue.  HENT: Positive for congestion, rhinorrhea and sore throat. Negative for ear pain, sinus pressure and trouble swallowing.   Eyes: Negative for discharge and redness.  Respiratory: Positive for cough and shortness of breath. Negative for chest tightness.   Cardiovascular: Negative for chest pain.  Gastrointestinal: Negative for abdominal pain, diarrhea, nausea and vomiting.  Musculoskeletal: Negative for myalgias.  Skin: Negative for rash.  Neurological: Positive for headaches. Negative for dizziness and light-headedness.      Physical Exam Triage Vital Signs ED Triage Vitals  Enc Vitals Group     BP 10/29/18 1537 129/73     Pulse Rate 10/29/18 1537 (!) 102     Resp 10/29/18 1537 18     Temp 10/29/18 1537 98.2 F (36.8 C)     Temp src --      SpO2 10/29/18 1537 100 %     Weight 10/29/18 1540 275 lb (124.7 kg)     Height --      Head Circumference --      Peak Flow --      Pain Score 10/29/18 1540 8     Pain Loc --      Pain Edu? --      Excl. in [place]? --    No data found.  Updated Vital Signs BP 129/73 (BP Location: Right Arm)   Pulse (!) 102   Temp 98.2 F (36.8 C)   Resp 18   Wt 275 lb (124.7 kg)   SpO2 100%   BMI 43.07 kg/m   Visual Acuity Right Eye Distance:   Left Eye Distance:   Bilateral Distance:    Right Eye Near:   Left Eye Near:    Bilateral Near:     Physical Exam Vitals signs and nursing note reviewed.  Constitutional:      General: She is not in acute distress.    Appearance: She is well-developed.  HENT:     Head: Normocephalic and atraumatic.     Ears:     Comments: Bilateral ears without tenderness to palpation of external auricle, tragus and mastoid, EAC's without erythema or swelling, TM's with good bony landmarks and cone of light. Non erythematous.     Mouth/Throat:     Comments: Oral mucosa pink and moist, no tonsillar enlargement or exudate. Posterior pharynx patent and nonerythematous, no uvula deviation or swelling. Normal phonation.  Eyes:     Conjunctiva/sclera: Conjunctivae normal.  Neck:     Musculoskeletal: Neck supple.  Cardiovascular:     Rate and Rhythm: Normal rate and regular rhythm.     Heart sounds: No murmur.  Pulmonary:     Effort: Pulmonary effort is normal. No respiratory distress.     Breath sounds: Normal breath sounds.     Comments: Breathing comfortably at rest, CTABL, no wheezing, rales or other adventitious sounds auscultated  Abdominal:     Palpations: Abdomen is soft.     Tenderness: There is no abdominal tenderness.   Skin:    General: Skin is warm and dry.  Neurological:     Mental Status: She  is alert.      UC Treatments / Results  Labs (all labs ordered are listed, but only abnormal results are displayed) Labs Reviewed - No data to display  EKG None  Radiology No results found.  Procedures Procedures (including critical care time)  Medications Ordered in UC Medications - No data to display  Initial Impression / Assessment and Plan / UC Course  I have reviewed the triage vital signs and the nursing notes.  Pertinent labs & imaging results that were available during my care of the patient were reviewed by me and considered in my medical decision making (see chart for details).     URI symptoms x3 days, vital signs stable in clinic today, lungs clear, likely viral URI, possible influenza fevers earlier in the course, outside of Tamiflu window.  Will recommend symptomatic and supportive care with continued monitoring.  Recommendations below, rest, push fluids.Discussed strict return precautions. Patient verbalized understanding and is agreeable with plan.  Final Clinical Impressions(s) / UC Diagnoses   Final diagnoses:  Viral URI with cough     Discharge Instructions     You likely having a viral upper respiratory infection. We recommended symptom control. I expect your symptoms to start improving in the next 1-2 weeks.   1. Take a daily allergy pill/anti-histamine like Zyrtec, Claritin, or Store brand consistently for 2 weeks  2. For congestion you may try an oral decongestant like Mucinex or sudafed. You may also try intranasal flonase nasal spray or saline irrigations (neti pot, sinus cleanse)  3. For your sore throat you may try cepacol lozenges, salt water gargles, throat spray. Treatment of congestion may also help your sore throat.  4. For cough you may try Cough syrup provided or over the counter delsym, Robitussen, Mucinex DM  5. Take Tylenol or Ibuprofen to help with  pain/inflammation  6. Stay hydrated, drink plenty of fluids to keep throat coated and less irritated  Honey Tea For cough/sore throat try using a honey-based tea. Use 3 teaspoons of honey with juice squeezed from half lemon. Place shaved pieces of ginger into 1/2-1 cup of water and warm over stove top. Then mix the ingredients and repeat every 4 hours as needed.   ED Prescriptions    Medication Sig Dispense Auth. Provider   brompheniramine-pseudoephedrine-DM 30-2-10 MG/5ML syrup Take 5 mLs by mouth 4 (four) times daily as needed. 120 mL Sheena Simonis C, PA-C   Cetirizine HCl 10 MG CAPS Take 1 capsule (10 mg total) by mouth daily for 10 days. 10 capsule Niza Soderholm C, PA-C   ibuprofen (ADVIL,MOTRIN) 600 MG tablet Take 1 tablet (600 mg total) by mouth every 6 (six) hours as needed. 30 tablet Haleemah Buckalew, [place] C, PA-C     Controlled Substance Prescriptions [place] Controlled Substance Registry consulted? Not Applicable   Janith Lima, [place] 10/31/18 [phone]

## 2018-12-05 ENCOUNTER — Ambulatory Visit (INDEPENDENT_AMBULATORY_CARE_PROVIDER_SITE_OTHER): Payer: Medicaid Other | Admitting: Primary Care

## 2018-12-05 ENCOUNTER — Other Ambulatory Visit: Payer: Self-pay

## 2018-12-05 ENCOUNTER — Encounter: Payer: Self-pay | Admitting: Primary Care

## 2018-12-05 ENCOUNTER — Ambulatory Visit: Payer: Medicaid Other | Admitting: Pulmonary Disease

## 2018-12-05 DIAGNOSIS — R0609 Other forms of dyspnea: Secondary | ICD-10-CM

## 2018-12-05 MED ORDER — ALBUTEROL SULFATE HFA 108 (90 BASE) MCG/ACT IN AERS: 2.0000 | INHALATION_SPRAY | Freq: Four times a day (QID) | RESPIRATORY_TRACT | 5 refills | Status: DC | PRN

## 2018-12-05 NOTE — Progress Notes (Signed)
Virtual Visit via Telephone Note  I connected with Julie King on 12/05/18 at 10:00 AM EDT by telephone and verified that I am speaking with the correct person using two identifiers.   I discussed the limitations, risks, security and privacy concerns of performing an evaluation and management service by telephone and the availability of in person appointments. I also discussed with the patient that there may be a patient responsible charge related to this service. The patient expressed understanding and agreed to proceed.   History of Present Illness: 28 year old female, current smoker 0.5 ppd (5 pack year hx). PMH significant for morbid obesity (BMI 45), afib, obstructive lung disease. Patient of Dr. Loanne Drilling, seen for initial consult on 09/05/18 for cough. Ordered for PFTs. Continues Ventolin hfa and given RX for prednisone taper.   12/05/2018 Called patient today for 3 month follow up visit. Per Dr. Loanne Drilling, PFTs showed mild obstructive lung defect with no BD response. No evidence of asthma but patient took Albuterol hfa morning of test. Ordered for methacholine change. Gets short of breath with activity. Using rescue inaher as needed. She has been trying to do some conditioning at home but finds it hard. Hasn't had the urge to smoke in the last two days. Unsure if she is pregnant. OTC test negative. Plans on calling primary care.   Observations/Objective:  Observations: - No shortness of breath, wheezing or cough  Testing Reviewed: > PFT 2020- FVC 3.36 (93), FEV1 2.78 (90%), ratio 83. Negative BD response. Normal DLCO. Normal flow volume loop, slight curvature > PFT 2017- Ratio 77, FEV1 3.72 (88%), FVC 3.72 (88%) > CT chest 04/16/2018-normal lung fields without evidence of groundglass opacity, infiltrate, effusion or edema.  No adenopathy noted > CXR 05/14/2018-normal x-ray.  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  - Needs methacholine challenge - Continue Albuterol hfa q6 hours prn sob/wheezing  - Discussed weight loss and increasing physical activity - Strongly encouraged smoking cessation - Consider FENO and respiratory allergy panel at next visit   Menstrual change: - OTC pregnancy test negative - She plans on following up with PCP - If positive will need to reconsider methacholine test  - Ok to continue Albuterol rescue inhaler   Follow Up Instructions:   3-4 months with Dr. Loanne Drilling  I discussed the assessment and treatment plan with the patient. The patient was provided an opportunity to ask questions and all were answered. The patient agreed with the plan and demonstrated an understanding of the instructions.   The patient was advised to call back or seek an in-person evaluation if the symptoms worsen or if the condition fails to improve as anticipated.  I provided 20 minutes of non-face-to-face time during this encounter.   Martyn Ehrich, NP

## 2018-12-05 NOTE — Patient Instructions (Signed)
  Awaiting additional testing: Needs methacholine challenge to rule in/out asthma  Recommendations: - STOP smoking!!! :)) - Ok to continue to use Albuterol rescue inahler - take 2 puffs every 6 hours as needed for shortness of breath/wheezing - Work on weight loss and increasing physical exercise - Consider attending either weight watchers or [facility] weight management program (bothg great options!!) - Lets [place] know if you're testing with your primary care comes back positive  Follow up: 3-4 months with Dr. Loanne Drilling or sooner if symptoms worsen

## 2019-02-17 IMAGING — CR DG NECK SOFT TISSUE
2 series · 2 of 2 positions shown · non-contrast
Comparison: None.

CLINICAL DATA: Left mandibular mass.  Difficulty swallowing.

EXAM:
NECK SOFT TISSUES - 1+ VIEW

[neck lat]
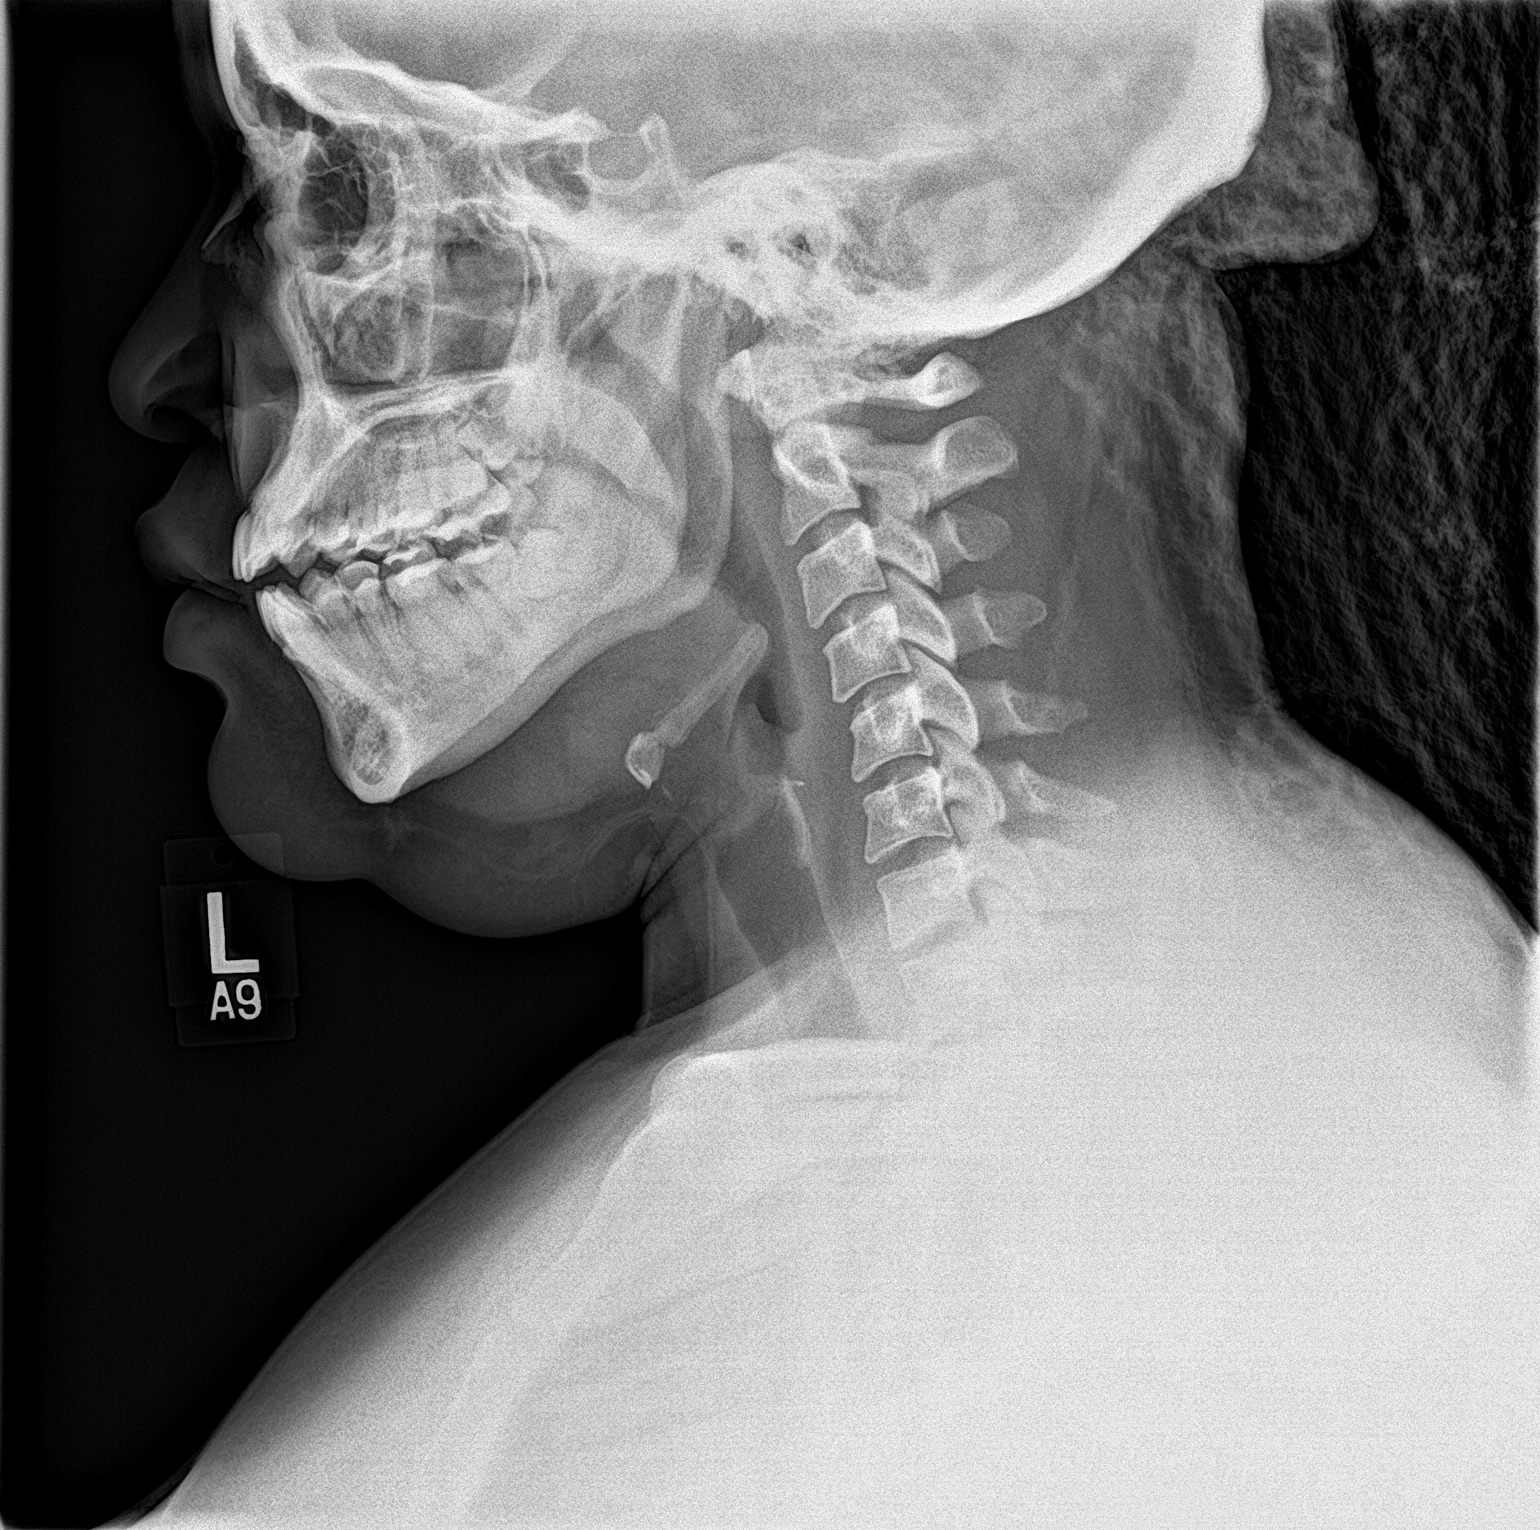

[neck ap]
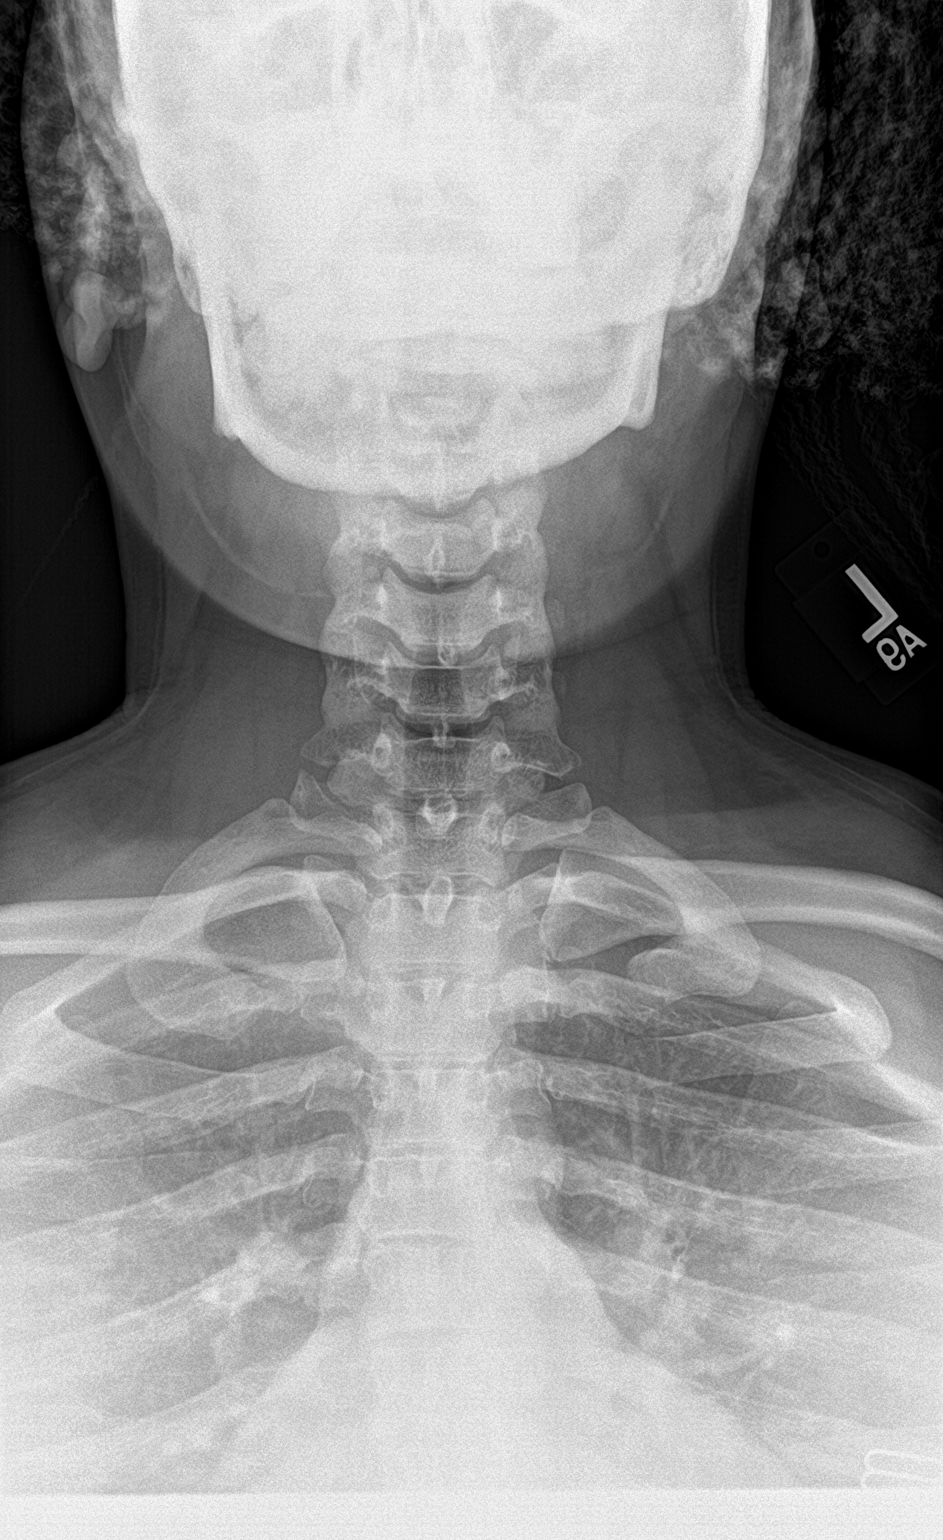

[2 of 2 positions shown; findings below may reference images not displayed]

FINDINGS: The epiglottis and area epiglottic folds appear normal. Slightly
prominent lymphoid tissue noted at the base of tongue. No abnormal
prevertebral soft tissue swelling or worrisome air collections. The
trachea appears normal. The cervical vertebral bodies appear normal.
The lung apices are clear.
IMPRESSION: Unremarkable soft tissue examination of the neck.

## 2019-04-28 NOTE — ED Provider Notes (Signed)
 ------------------------------------------------------------------------------- Attestation signed by Cindy Alm Fess Sr., DO at 04/29/2019 12:06 AM I have personally viewed the imaging studies performed. I have reviewed the nursing documentation on past medical history, family history, and social history.  I have personally seen and examined the patient, and discussed the plan of care with the resident.  I have reviewed the documentation of the resident and agree.   28 yo obese AA female with h/o Afib presents with chest fluttering.  Onset this 1230.  Pt reports intermittent brief fluttering.  No f/c, cough, SOB, n/v/d, abd pain.  She states she has Afib, but hasn't taken Metoprolol  or ASA for 1 yr due to financial issues.  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     PE reassuring.  HR 80s regular.  Lungs CTAB.  Abd soft, NT, ND, no g/r/m.  Ext: no c/c/e, no ttp.  No lightheadedness or presyncope.    EKG with sinus rhythm at 83.  Axis normal.  Intervals normal (QTc is 446).  Chambers ok. Nonspecific TW changes.     Pt has HEAR of 1.  Plan for HEAR pathway.  Doubt PE - PERC neg, AD, pneumothorax, pna or other emergent issue.  Signout given to APP in CDU.     Electronically signed by: Alm Fess Cindy Sr., DO 04/28/19 1556 -------------------------------------------------------------------------------   History   Chief Complaint  Patient presents with  . Chest Pain   Pt is a 28 y/o W  With a past medical history of asthma, gall stones, MDD,  and atrial fibrillation, that presented to the ED for chest discomfort. Patient stated discomfort in the chest started the morning of her arrival to the ED when she was cooking. Pt stated she felt her heart pop a couple of times and shortly after felt dizzy, weak, and nausea. She called her primary care physician at the [facility] who suggested that she present to the ED for evaluation. She does not endorse a fall nor LOC. Pt stated  she had a previous episode similar last year in which she was diagnosed with atrial fibrillation and given metoprolol  and aspirin  for rate control and prophylaxis. She states she has not been able to be compliant with medications because she is no longer on Medicaid. Patient currently states she's in no pain but feels chest discomfort with no alleviating or exacerbating factors. She states that she has not taken anything for the discomfort.  She reports having mild shortness of breath.     Past Medical History:  Diagnosis Date  . Asthma    patient is not aware of this  . Gallstones   . MDD (major depressive disorder)   . Pulmonary infiltrate   . QT prolongation   . T wave inversion in EKG     Past Surgical History:  Procedure Laterality Date  . CESAREAN SECTION  11/25/2011  . HERNIA REPAIR  2000  . LAPAROSCOPIC CHOLECYSTECTOMY W/ CHOLANGIOGRAPHY N/A 11/08/2014   Procedure: CHOLECYSTECTOMY LAPAROSCOPIC W/ CHOLANGIOGRAM;  Surgeon: Lynwood Leigh Rosabel MADISON, MD;  Location: [facility] MAIN OR;  Service: General;  Laterality: N/A;  add-on    Family History  Problem Relation Age of Onset  . Diabetes type II Mother   . Hypertension Mother   . Inflammatory bowel disease Mother        Crohn's  . Diabetes type II Father   . Hypertension Father   . Thyroid  cancer Father   . Breast cancer Other   . Rheum arthritis Neg Hx   .  Lupus Neg Hx   . Ankylosing spondylitis Neg Hx   . Clotting disorder Neg Hx   . Psoriasis Neg Hx     Social History   Tobacco Use  . Smoking status: Current Some Day Smoker    Packs/day: 0.50    Types: Cigarettes  . Smokeless tobacco: Current User  Substance Use Topics  . Alcohol use: Yes    Comment: socially  . Drug use: No     Review of Systems  Constitutional: Negative.   HENT: Negative.   Eyes: Negative.   Respiratory: Positive for shortness of breath.        Mild shortness of breath. Patient SpO2 sat at 97%  Cardiovascular: Positive for chest pain.        Chest discomfort  Gastrointestinal: Negative.   Endocrine: Negative.   Genitourinary: Negative.   Musculoskeletal: Negative.   Skin: Negative.   Allergic/Immunologic: Negative.   Neurological: Negative.   Hematological: Negative.   Psychiatric/Behavioral: Negative.      Physical Exam   ED Triage Vitals  BP 04/28/19 1355 134/89  MAP (mmHg) 04/28/19 1355 100  Pulse 04/28/19 1355 74  Resp 04/28/19 1355 15  Temp 04/28/19 1400 99.2 F (37.3 C)  SpO2 04/28/19 1355 97 %  Weight --     Physical Exam Constitutional:      Appearance: She is obese.  HENT:     Head: Normocephalic and atraumatic.  Cardiovascular:     Rate and Rhythm: Normal rate and regular rhythm.  Neurological:     Mental Status: She is alert.       ED Course  Procedures   MDM MDM Pt is a 28 y/o woman with a past medical history of asthma, MDD, and atrial fibrilation that presented to the ED for chest discomfort.  Pt BMP, CBC, and troponin were obtained. Please note the results below. Pt had a HEAR score of one.  Labs Reviewed  CBC - Abnormal; Notable for the following components:      Result Value   WBC 11.8 (*)    RBC 5.76 (*)    Hemoglobin 11.0 (*)    Hematocrit 35.8 (*)    MCV 62.3 (*)    MCH 19.1 (*)    MCHC 30.6 (*)    RDW 16.3 (*)    All other components within normal limits  BASIC METABOLIC PANEL - Abnormal; Notable for the following components:   Glucose 100 (*)    All other components within normal limits  DIFF ONLY - Abnormal; Notable for the following components:   WBC 13.0 (*)    Seg Absolute 7.9 (*)    Eosinophil Absolute 0.8 (*)    All other components within normal limits  TROPONIN I - Normal  TROPONIN I   I had a low suspicion of DKA given the patients clinical presentation and blood glucose.   AP of the chest revealed the following. A PA and lateral chest XR was ordered for further evaluation XR CHEST AP  PORTABLE  Preliminary Result    Mild streaky opacities of the  right base which may represent atelectasis or aspiration/pneumonia. Upright PA and lateral chest radiographs could further evaluate.       XR CHEST PA AND LATERAL    (Results Pending)   CDU was consulted. Patient currently observed by CDU.   Plan of care was discussed with the CDU . Please review their notes for further MDM  Clinical Impression:  1. Chest discomfort  ED Disposition    None       Electronically signed by: Angela Reginia Cumins, MD Resident 04/28/19 [phone]    Electronically signed by: Angela Reginia Cumins, MD Resident 04/28/19 1744    Electronically signed by: Alm Fess Masneri Sr., DO 04/29/19 0006

## 2019-08-26 ENCOUNTER — Ambulatory Visit (HOSPITAL_COMMUNITY)
Admission: EM | Admit: 2019-08-26 | Discharge: 2019-08-26 | Disposition: A | Discharge status: Home / Self Care | Payer: Medicaid Other | Attending: Family Medicine | Admitting: Family Medicine

## 2019-08-26 ENCOUNTER — Encounter (HOSPITAL_COMMUNITY): Payer: Self-pay | Admitting: Emergency Medicine

## 2019-08-26 ENCOUNTER — Other Ambulatory Visit: Payer: Self-pay

## 2019-08-26 DIAGNOSIS — Z3A15 15 weeks gestation of pregnancy: Secondary | ICD-10-CM

## 2019-08-26 DIAGNOSIS — J069 Acute upper respiratory infection, unspecified: Secondary | ICD-10-CM | POA: Insufficient documentation

## 2019-08-26 DIAGNOSIS — Z20822 Contact with and (suspected) exposure to covid-19: Secondary | ICD-10-CM

## 2019-08-26 DIAGNOSIS — U071 COVID-19: Secondary | ICD-10-CM | POA: Diagnosis not present

## 2019-08-26 DIAGNOSIS — Z20828 Contact with and (suspected) exposure to other viral communicable diseases: Secondary | ICD-10-CM

## 2019-08-26 NOTE — Discharge Instructions (Addendum)
Your Covid test will be available within the next couple of days.  Check for results on MyChart Rest and drink plenty of fluids Be careful not to spread infection to your family.  Wear a mask.  Wash hands frequently Call your OB/GYN when you get your Covid test results

## 2019-08-26 NOTE — ED Triage Notes (Signed)
PT reports headache, congestion, cough. She has been exposed to someone who is COVID positive. Headache has been present for 3-4 days.   She is [redacted] weeks pregnant.

## 2019-08-26 NOTE — ED Provider Notes (Signed)
[place]    CSN: [id number] Arrival date & time: 08/26/19  [id number]      History   Chief Complaint Chief Complaint  Patient presents with  . Headache  . Nasal Congestion    HPI Julie King is a 28 y.o. female.   HPI  Patient states she is [redacted] weeks pregnant.  She states that she is sick with an upper respiratory infection.  She was exposed to somebody who had COVID-19.  She is worried about having COVID-19.  She has been having headache, congestion, and cough.  No fever or chills.  No body aches.  No nausea vomiting or diarrhea.  No shortness of breath.  No chest pain.  No rash.  No loss of taste or smell.  Her appetite is good.  Her weight is stable.  The baby is moving.  Past Medical History:  Diagnosis Date  . Atrial fibrillation ([place])   . Headache 07/25/2017  . Hypertension   . Irritable bowel syndrome   . Morbidly obese ([place]) 07/25/2017  . QT prolongation   . T wave inversion in EKG     Patient Active Problem List   Diagnosis Date Noted  . Headache 07/25/2017  . Morbidly obese ([place]) 07/25/2017    Past Surgical History:  Procedure Laterality Date  . CESAREAN SECTION    . CHOLECYSTECTOMY    . HERNIA REPAIR    . TONSILLECTOMY      OB History    Gravida  2   Para  1   Term  1   Preterm      AB      Living  1     SAB      TAB      Ectopic      Multiple      Live Births  1            Home Medications    Prior to Admission medications   Medication Sig Start Date End Date Taking? Authorizing Provider  albuterol (PROVENTIL HFA;VENTOLIN HFA) 108 (90 Base) MCG/ACT inhaler Inhale 2 puffs into the lungs every 6 (six) hours as needed for wheezing or shortness of breath. 12/05/18   Martyn Ehrich, NP  Cetirizine HCl 10 MG CAPS Take 1 capsule (10 mg total) by mouth daily for 10 days. 10/29/18 11/08/18  Wieters, Elesa Hacker, PA-C    Family History Family History  Problem Relation Age of Onset  . Hypertension Other   . Diabetes  Other   . Crohn's disease Mother   . Hypertension Mother   . Thyroid disease Father   . Diabetes Father   . Breast cancer Maternal Aunt        late 46 early 50s  . Breast cancer Maternal Grandmother        late 58s early 52    Social History Social History   Tobacco Use  . Smoking status: Current Every Day Smoker    Packs/day: 0.50    Years: 10.00    Pack years: 5.00    Types: Cigarettes  . Smokeless tobacco: Never Used  . Tobacco comment: wants to quit but states very difficult Jan 2020  Substance Use Topics  . Alcohol use: Yes    Comment: Twice every 6 months   . Drug use: No     Allergies   Percocet [oxycodone-acetaminophen] and Pineapple   Review of Systems Review of Systems  Constitutional: Negative for chills and fever.  HENT: Positive for congestion.  Negative for hearing loss.   Eyes: Negative for pain.  Respiratory: Positive for cough. Negative for shortness of breath.   Cardiovascular: Negative for chest pain and leg swelling.  Gastrointestinal: Negative for abdominal pain, constipation and diarrhea.  Genitourinary: Negative for dysuria and frequency.  Musculoskeletal: Negative for myalgias.  Neurological: Positive for headaches. Negative for dizziness and seizures.  Psychiatric/Behavioral: The patient is not nervous/anxious.      Physical Exam Triage Vital Signs ED Triage Vitals  Enc Vitals Group     BP 08/26/19 1032 (!) 116/52     Pulse Rate 08/26/19 1032 88     Resp 08/26/19 1032 18     Temp 08/26/19 1032 98.5 F (36.9 C)     Temp Source 08/26/19 1032 Oral     SpO2 08/26/19 1032 100 %     Weight --      Height --      Head Circumference --      Peak Flow --      Pain Score 08/26/19 1035 3     Pain Loc --      Pain Edu? --      Excl. in [place]? --    No data found.  Updated Vital Signs BP (!) 116/52   Pulse 88   Temp 98.5 F (36.9 C) (Oral)   Resp 18   SpO2 100%       Physical Exam Constitutional:      General: She is not in acute  distress.    Appearance: She is well-developed.     Comments: Overweight  HENT:     Head: Normocephalic and atraumatic.     Nose: Nose normal.     Mouth/Throat:     Mouth: Mucous membranes are moist.  Eyes:     Extraocular Movements: Extraocular movements intact.     Conjunctiva/sclera: Conjunctivae normal.     Pupils: Pupils are equal, round, and reactive to light.  Cardiovascular:     Rate and Rhythm: Normal rate and regular rhythm.     Heart sounds: Murmur present.  Pulmonary:     Effort: Pulmonary effort is normal. No respiratory distress.     Breath sounds: Normal breath sounds.     Comments: Lungs are clear Abdominal:     General: There is no distension.     Palpations: Abdomen is soft.     Tenderness: There is no abdominal tenderness.  Musculoskeletal:        General: Normal range of motion.     Cervical back: Normal range of motion and neck supple.  Skin:    General: Skin is warm and dry.  Neurological:     General: No focal deficit present.     Mental Status: She is alert.  Psychiatric:        Mood and Affect: Mood normal.        Behavior: Behavior normal.      UC Treatments / Results  Labs (all labs ordered are listed, but only abnormal results are displayed) Labs Reviewed  NOVEL CORONAVIRUS, NAA (HOSP ORDER, SEND-OUT TO REF LAB; TAT 18-24 HRS)    EKG   Radiology No results found.  Procedures Procedures (including critical care time)  Medications Ordered in UC Medications - No data to display  Initial Impression / Assessment and Plan / UC Course  I have reviewed the triage vital signs and the nursing notes.  Pertinent labs & imaging results that were available during my care of the patient were reviewed  by me and considered in my medical decision making (see chart for details).     Discussed coronavirus.  Treatment.  Over-the-counter medications.  Spread.  How to get test results. Final Clinical Impressions(s) / UC Diagnoses   Final  diagnoses:  Viral upper respiratory tract infection  Close exposure to COVID-19 virus     Discharge Instructions     Your Covid test will be available within the next couple of days.  Check for results on MyChart Rest and drink plenty of fluids Be careful not to spread infection to your family.  Wear a mask.  Wash hands frequently Call your OB/GYN when you get your Covid test results    ED Prescriptions    None     PDMP not reviewed this encounter.   Raylene Everts, MD 08/26/19 2156

## 2019-08-27 LAB — NOVEL CORONAVIRUS, NAA (HOSP ORDER, SEND-OUT TO REF LAB; TAT 18-24 HRS): SARS-CoV-2, NAA: DETECTED — AB

## 2019-08-28 ENCOUNTER — Telehealth: Payer: Self-pay | Admitting: Emergency Medicine

## 2019-08-28 NOTE — Telephone Encounter (Signed)

## 2019-08-29 ENCOUNTER — Encounter (HOSPITAL_COMMUNITY): Payer: Self-pay

## 2019-08-29 ENCOUNTER — Encounter (HOSPITAL_COMMUNITY): Payer: Self-pay | Admitting: Emergency Medicine

## 2019-08-29 ENCOUNTER — Emergency Department (HOSPITAL_COMMUNITY)
Admission: EM | Admit: 2019-08-29 | Discharge: 2019-08-29 | Discharge status: Against Medical Advice | Payer: Medicaid Other | Attending: Emergency Medicine | Admitting: Emergency Medicine

## 2019-08-29 ENCOUNTER — Other Ambulatory Visit: Payer: Self-pay

## 2019-08-29 ENCOUNTER — Ambulatory Visit (HOSPITAL_COMMUNITY)
Admission: EM | Admit: 2019-08-29 | Discharge: 2019-08-29 | Disposition: A | Discharge status: Home / Self Care | Payer: Medicaid Other | Attending: Family Medicine | Admitting: Family Medicine

## 2019-08-29 DIAGNOSIS — U071 COVID-19: Secondary | ICD-10-CM

## 2019-08-29 DIAGNOSIS — Z5321 Procedure and treatment not carried out due to patient leaving prior to being seen by health care provider: Secondary | ICD-10-CM | POA: Diagnosis not present

## 2019-08-29 DIAGNOSIS — Z3A00 Weeks of gestation of pregnancy not specified: Secondary | ICD-10-CM

## 2019-08-29 DIAGNOSIS — R0602 Shortness of breath: Secondary | ICD-10-CM

## 2019-08-29 NOTE — ED Triage Notes (Signed)
Pt states she is having SOB and pressure in her chest x 1 day. Pt states she needs to sleep sitting down because of the SOB. Pt states her PCP told her to have an xray done to ruled out pneumonia. Pt is [redacted] weeks pregnant.

## 2019-08-29 NOTE — Discharge Instructions (Addendum)
If you become more short of breath, or feel more weak or dizzy you MUST go to the ER Drink lots of fluids Tylenol for pain or fever COVID causes shortness of breath.  Cough.  Body aches.  This is expected Call for worsening symptoms A chest x ray will only be done if you need to be admitted, have low oxygen, or worsening problems

## 2019-08-29 NOTE — ED Triage Notes (Signed)
Pt. Stated, I have chest pain and SOB  And Im also COVID . COVID test was positive yesterday. I started having symptoms last Saturday, went to UC on Monday.  Im [redacted] weeks pregnant. Pregnancy has been confirmed Dr. In [place] Allegra

## 2019-08-29 NOTE — ED Provider Notes (Addendum)
[place]    CSN: [id number] Arrival date & time: 08/29/19  1721      History   Chief Complaint Chief Complaint  Patient presents with  . Shortness of Breath    + COVID    HPI Julie King is a 28 y.o. female.   HPI  Patient is here for shortness of breath.  She was seen on 08/26/2019 for headache and upper respiratory infection.  Coronavirus test was positive.  She is back because she is pregnant, positive coronavirus, and having increasing shortness of breath.  This has her feeling anxious.  She states that she called to her primary care office today.  They told her to go to the emergency room.  She went to the emergency room and there was too long to wait.  She decided to come here instead.  She states that she was told by her primary care provider that she needed a chest x-ray.  This is not documented in the primary care note.  She spoke with an LPN at the office.  She states she had to sleep sitting up last night because she was unable to lie flat with the shortness of breath. On arrival the patient is in no acute distress.  Her vital signs are stable.  Oxygenation level is 100%.  She is afebrile.  Normal pulse, respiration, and blood pressure. She states pregnancy is going well.  Baby is moving.  Past Medical History:  Diagnosis Date  . Atrial fibrillation ([place])   . Headache 07/25/2017  . Hypertension   . Irritable bowel syndrome   . Morbidly obese ([place]) 07/25/2017  . QT prolongation   . T wave inversion in EKG     Patient Active Problem List   Diagnosis Date Noted  . Headache 07/25/2017  . Morbidly obese ([place]) 07/25/2017    Past Surgical History:  Procedure Laterality Date  . CESAREAN SECTION    . CHOLECYSTECTOMY    . HERNIA REPAIR    . TONSILLECTOMY      OB History    Gravida  2   Para  1   Term  1   Preterm      AB      Living  1     SAB      TAB      Ectopic      Multiple      Live Births  1            Home  Medications    Prior to Admission medications   Medication Sig Start Date End Date Taking? Authorizing Provider  albuterol (PROVENTIL HFA;VENTOLIN HFA) 108 (90 Base) MCG/ACT inhaler Inhale 2 puffs into the lungs every 6 (six) hours as needed for wheezing or shortness of breath. 12/05/18   Martyn Ehrich, NP  Cetirizine HCl 10 MG CAPS Take 1 capsule (10 mg total) by mouth daily for 10 days. 10/29/18 11/08/18  Wieters, Elesa Hacker, PA-C    Family History Family History  Problem Relation Age of Onset  . Hypertension Other   . Diabetes Other   . Crohn's disease Mother   . Hypertension Mother   . Thyroid disease Father   . Diabetes Father   . Breast cancer Maternal Aunt        late 37 early 65s  . Breast cancer Maternal Grandmother        late 72s early 70    Social History Social History   Tobacco Use  .  Smoking status: Current Every Day Smoker    Packs/day: 0.50    Years: 10.00    Pack years: 5.00    Types: Cigarettes  . Smokeless tobacco: Never Used  . Tobacco comment: wants to quit but states very difficult Jan 2020  Substance Use Topics  . Alcohol use: Yes    Comment: Twice every 6 months   . Drug use: No     Allergies   Percocet [oxycodone-acetaminophen] and Pineapple   Review of Systems Review of Systems  Constitutional: Negative for chills and fever.  HENT: Negative for congestion and hearing loss.   Eyes: Negative for pain.  Respiratory: Positive for cough and shortness of breath.   Cardiovascular: Negative for chest pain and leg swelling.  Gastrointestinal: Negative for abdominal pain, constipation and diarrhea.  Genitourinary: Negative for dysuria and frequency.  Musculoskeletal: Negative for myalgias.  Neurological: Negative for dizziness, seizures and headaches.  Psychiatric/Behavioral: Positive for sleep disturbance. The patient is not nervous/anxious.      Physical Exam Triage Vital Signs ED Triage Vitals  Enc Vitals Group     BP 08/29/19 1822  122/67     Pulse Rate 08/29/19 1822 86     Resp 08/29/19 1822 18     Temp 08/29/19 1822 98.1 F (36.7 C)     Temp Source 08/29/19 1822 Oral     SpO2 08/29/19 1822 100 %     Weight --      Height --      Head Circumference --      Peak Flow --      Pain Score 08/29/19 1819 8     Pain Loc --      Pain Edu? --      Excl. in [place]? --    No data found.  Updated Vital Signs BP 122/67 (BP Location: Left Arm)   Pulse 86   Temp 98.1 F (36.7 C) (Oral)   Resp 18   LMP 06/02/2019   SpO2 100%       Physical Exam Constitutional:      General: She is not in acute distress.    Appearance: She is well-developed. She is obese.     Comments: Speaks in full sentences  HENT:     Head: Normocephalic and atraumatic.  Eyes:     Conjunctiva/sclera: Conjunctivae normal.     Pupils: Pupils are equal, round, and reactive to light.  Cardiovascular:     Rate and Rhythm: Normal rate and regular rhythm.     Heart sounds: Normal heart sounds.  Pulmonary:     Effort: Pulmonary effort is normal. No respiratory distress.     Breath sounds: No decreased breath sounds, wheezing, rhonchi or rales.  Chest:     Chest wall: No tenderness.  Abdominal:     General: There is no distension.     Palpations: Abdomen is soft.  Musculoskeletal:        General: Normal range of motion.     Cervical back: Normal range of motion.  Skin:    General: Skin is warm and dry.  Neurological:     General: No focal deficit present.     Mental Status: She is alert.      UC Treatments / Results  Labs (all labs ordered are listed, but only abnormal results are displayed) Labs Reviewed - No data to display  EKG- sinus tachycardia, no ST or T wave changes, normal intervals: NO STEMI   Radiology No results found.  Procedures Procedures (including critical care time)  Medications Ordered in UC Medications - No data to display  Initial Impression / Assessment and Plan / UC Course  I have reviewed the triage  vital signs and the nursing notes.  Pertinent labs & imaging results that were available during my care of the patient were reviewed by me and considered in my medical decision making (see chart for details).     I explained to the patient that she was not going to be getting an x-ray.  It is not medically indicated.  Her vital signs are stable.  Oxygen is good.  She does get more short of breath when she gets animated with conversation, but is not frankly dyspneic.  I explained to her that she has coronavirus, shortness of breath is part of the disease process and is to be expected.  We discussed that if she becomes more short of breath to the point where she is having difficulty breathing, weak, or dizzy, she would need to go to the emergency room. Final Clinical Impressions(s) / UC Diagnoses   Final diagnoses:  [id number] virus infection     Discharge Instructions     If you become more short of breath, or feel more weak or dizzy you MUST go to the ER Drink lots of fluids Tylenol for pain or fever COVID causes shortness of breath.  Cough.  Body aches.  This is expected Call for worsening symptoms A chest x ray will only be done if you need to be admitted, have low oxygen, or worsening problems   ED Prescriptions    None     PDMP not reviewed this encounter.   Raylene Everts, MD 08/29/19 1929    Raylene Everts, MD 09/26/19 [phone]

## 2019-09-24 ENCOUNTER — Other Ambulatory Visit: Payer: Self-pay

## 2019-09-24 ENCOUNTER — Inpatient Hospital Stay (HOSPITAL_COMMUNITY)
Admission: EM | Admit: 2019-09-24 | Discharge: 2019-09-24 | Disposition: A | Discharge status: Home / Self Care | Payer: BC Managed Care – PPO | Attending: Obstetrics & Gynecology | Admitting: Obstetrics & Gynecology

## 2019-09-24 ENCOUNTER — Encounter (HOSPITAL_COMMUNITY): Payer: Self-pay | Admitting: Obstetrics & Gynecology

## 2019-09-24 DIAGNOSIS — Z885 Allergy status to narcotic agent status: Secondary | ICD-10-CM | POA: Diagnosis not present

## 2019-09-24 DIAGNOSIS — Z87891 Personal history of nicotine dependence: Secondary | ICD-10-CM | POA: Diagnosis not present

## 2019-09-24 DIAGNOSIS — O26892 Other specified pregnancy related conditions, second trimester: Secondary | ICD-10-CM | POA: Insufficient documentation

## 2019-09-24 DIAGNOSIS — R319 Hematuria, unspecified: Secondary | ICD-10-CM | POA: Insufficient documentation

## 2019-09-24 DIAGNOSIS — O99212 Obesity complicating pregnancy, second trimester: Secondary | ICD-10-CM | POA: Insufficient documentation

## 2019-09-24 DIAGNOSIS — Z3492 Encounter for supervision of normal pregnancy, unspecified, second trimester: Secondary | ICD-10-CM

## 2019-09-24 DIAGNOSIS — R109 Unspecified abdominal pain: Secondary | ICD-10-CM | POA: Insufficient documentation

## 2019-09-24 DIAGNOSIS — Z3A18 18 weeks gestation of pregnancy: Secondary | ICD-10-CM | POA: Diagnosis not present

## 2019-09-24 LAB — WET PREP, GENITAL
Clue Cells Wet Prep HPF POC: NONE SEEN
Sperm: NONE SEEN
Trich, Wet Prep: NONE SEEN
WBC, Wet Prep HPF POC: NONE SEEN
Yeast Wet Prep HPF POC: NONE SEEN

## 2019-09-24 LAB — URINALYSIS, ROUTINE W REFLEX MICROSCOPIC
Bacteria, UA: NONE SEEN
Bilirubin Urine: NEGATIVE
Glucose, UA: NEGATIVE mg/dL
Hgb urine dipstick: NEGATIVE
Ketones, ur: NEGATIVE mg/dL
Leukocytes,Ua: NEGATIVE
Nitrite: NEGATIVE
Protein, ur: 30 mg/dL — AB
Specific Gravity, Urine: 1.03 (ref 1.005–1.030)
pH: 6 (ref 5.0–8.0)

## 2019-09-24 NOTE — MAU Provider Note (Signed)
History     CSN: [id number]  Arrival date and time: 09/24/19 [id number]   First Provider Initiated Contact with Patient 09/24/19 [phone]      Chief Complaint  Patient presents with  . Abdominal Pain   HPI Julie King is a 29 y.o. G2P1001 at [redacted]w[redacted]d who presents to MAU with chief complaint of abdominal pain. This is a new problem, onset 09/23/2019. Her pain is suprapubic, does not radiate and she rates her pain as 7/10. She denies aggravating or alleviating factors. She has not taken medication or tried other treatments for this complaint. She declines pain medication in MAU as she reports that Tylenol consistently fails to alleviate her pain.She denies vaginal bleeding, abdominal tenderness, abnormal vaginal discharge, fever or recent illness.  Patient also endorses tailbone pain, which she rates as 4/10. This is a new problem. She denies history of low back pain, injury, recent fall.  OB History    Gravida  2   Para  1   Term  1   Preterm      AB      Living  1     SAB      TAB      Ectopic      Multiple      Live Births  1           Past Medical History:  Diagnosis Date  . Atrial fibrillation ([place])   . Headache 07/25/2017  . Hypertension   . Irritable bowel syndrome   . Morbidly obese ([place]) 07/25/2017  . QT prolongation   . T wave inversion in EKG     Past Surgical History:  Procedure Laterality Date  . CESAREAN SECTION    . CHOLECYSTECTOMY    . HERNIA REPAIR    . TONSILLECTOMY      Family History  Problem Relation Age of Onset  . Hypertension Other   . Diabetes Other   . Crohn's disease Mother   . Hypertension Mother   . Thyroid disease Father   . Diabetes Father   . Breast cancer Maternal Aunt        late 21 early 78s  . Breast cancer Maternal Grandmother        late 40s early 32    Social History   Tobacco Use  . Smoking status: Former Smoker    Packs/day: 0.50    Years: 10.00    Pack years: 5.00    Types: Cigarettes  . Smokeless  tobacco: Never Used  . Tobacco comment: wants to quit but states very difficult Jan 2020  Substance Use Topics  . Alcohol use: Not Currently    Comment: Twice every 6 months   . Drug use: No    Allergies:  Allergies  Allergen Reactions  . Percocet [Oxycodone-Acetaminophen] Hives, Itching and Nausea Only  . Pineapple Itching    Fresh pineapples; pineapples from a can is okay    Medications Prior to Admission  Medication Sig Dispense Refill Last Dose  . albuterol (PROVENTIL HFA;VENTOLIN HFA) 108 (90 Base) MCG/ACT inhaler Inhale 2 puffs into the lungs every 6 (six) hours as needed for wheezing or shortness of breath. 1 Inhaler 5 Past Month at Unknown time  . progesterone (ENDOMETRIN) 100 MG vaginal insert Place 100 mg vaginally 2 (two) times daily.   09/23/2019 at Unknown time  . progesterone 200 MG SUPP Place 200 mg vaginally at bedtime.     . Progesterone Micronized (PROGESTERONE PO) Take by mouth.   09/23/2019  at Unknown time  . Cetirizine HCl 10 MG CAPS Take 1 capsule (10 mg total) by mouth daily for 10 days. 10 capsule 0     Review of Systems  Constitutional: Negative for chills, fatigue and fever.  Respiratory: Negative for shortness of breath.   Gastrointestinal: Positive for abdominal pain.  Genitourinary: Negative for difficulty urinating, dysuria, flank pain, vaginal bleeding, vaginal discharge and vaginal pain.  Musculoskeletal: Negative for back pain.  Neurological: Negative for dizziness.  All other systems reviewed and are negative.  Physical Exam   Blood pressure (!) 101/55, pulse 88, temperature 98.7 F (37.1 C), temperature source Oral, resp. rate 19, height 5\' 7"  (1.702 m), weight 136.1 kg, last menstrual period 06/02/2019, SpO2 100 %.  Physical Exam  Nursing note and vitals reviewed. Constitutional: She is oriented to person, place, and time. She appears well-developed and well-nourished.  Cardiovascular: Normal rate.  Respiratory: Effort normal and breath  sounds normal.  GI: Soft. Bowel sounds are normal. She exhibits no distension. There is no abdominal tenderness. There is no rebound, no guarding and no CVA tenderness.  Genitourinary:    Genitourinary Comments: Cultures collected via blind swab   Musculoskeletal:        General: Normal range of motion.  Neurological: She is alert and oriented to person, place, and time.  Skin: Skin is warm and dry.  Psychiatric: She has a normal mood and affect. Her behavior is normal. Judgment and thought content normal.    MAU Course/MDM  Procedures  Patient Vitals for the past 24 hrs:  BP Temp Temp src Pulse Resp SpO2 Height Weight  09/24/19 0750 (!) 127/47 97.9 F (36.6 C) Oral 100 18 100 % -- --  09/24/19 0631 (!) 101/55 -- -- 88 -- -- -- --  09/24/19 0619 (!) 104/46 98.7 F (37.1 C) Oral 91 19 100 % -- --  09/24/19 0538 (!) 117/56 97.7 F (36.5 C) Oral (!) 108 -- 100 % 5\' 7"  (1.702 m) 136.1 kg   Results for orders placed or performed during the hospital encounter of 09/24/19 (from the past 24 hour(s))  Urinalysis, Routine w reflex microscopic     Status: Abnormal   Collection Time: 09/24/19  6:52 AM  Result Value Ref Range   Color, Urine AMBER (A) YELLOW   APPearance CLEAR CLEAR   Specific Gravity, Urine 1.030 1.005 - 1.030   pH 6.0 5.0 - 8.0   Glucose, UA NEGATIVE NEGATIVE mg/dL   Hgb urine dipstick NEGATIVE NEGATIVE   Bilirubin Urine NEGATIVE NEGATIVE   Ketones, ur NEGATIVE NEGATIVE mg/dL   Protein, ur 30 (A) NEGATIVE mg/dL   Nitrite NEGATIVE NEGATIVE   Leukocytes,Ua NEGATIVE NEGATIVE   RBC / HPF 11-20 0 - 5 RBC/hpf   WBC, UA 0-5 0 - 5 WBC/hpf   Bacteria, UA NONE SEEN NONE SEEN   Squamous Epithelial / LPF 0-5 0 - 5   Mucus PRESENT   Wet prep, genital     Status: None   Collection Time: 09/24/19  7:39 AM   Specimen: PATH Cytology Cervicovaginal Ancillary Only  Result Value Ref Range   Yeast Wet Prep HPF POC NONE SEEN NONE SEEN   Trich, Wet Prep NONE SEEN NONE SEEN   Clue  Cells Wet Prep HPF POC NONE SEEN NONE SEEN   WBC, Wet Prep HPF POC NONE SEEN NONE SEEN   Sperm NONE SEEN    Assessment and Plan  --29 y.o. G2P1001 at [redacted]w[redacted]d  --FHT 145 by Doppler --Hematuria, urine  culture in work --Discharge home in stable condition   Darlina Rumpf, [place] 09/24/2019, 9:18 AM

## 2019-09-24 NOTE — MAU Note (Addendum)
. .  Julie King is a 29 y.o. at [redacted]w[redacted]d here in MAU reporting: Intermittent abdominal cramping and tightening since 09/23/19. No vaginal bleeding no abnormal discharge. Pt also reports tailbone pain.   Pain score: abdominal:7/10;   Tailbone 4/10  FHR: Doppler 145

## 2019-09-24 NOTE — Discharge Instructions (Signed)
Abdominal Pain During Pregnancy  Belly (abdominal) pain is common during pregnancy. There are many possible causes. Most of the time, it is not a serious problem. Other times, it can be a sign that something is wrong with the pregnancy. Always tell your doctor if you have belly pain. Follow these instructions at home:  Do not have sex or put anything in your vagina until your pain goes away completely.  Get plenty of rest until your pain gets better.  Drink enough fluid to keep your pee (urine) pale yellow.  Take over-the-counter and prescription medicines only as told by your doctor.  Keep all follow-up visits as told by your doctor. This is important. Contact a doctor if:  Your pain continues or gets worse after resting.  You have lower belly pain that: ? Comes and goes at regular times. ? Spreads to your back. ? Feels like menstrual cramps.  You have pain or burning when you pee (urinate). Get help right away if:  You have a fever or chills.  You have vaginal bleeding.  You are leaking fluid from your vagina.  You are passing tissue from your vagina.  You throw up (vomit) for more than 24 hours.  You have watery poop (diarrhea) for more than 24 hours.  Your baby is moving less than usual.  You feel very weak or faint.  You have shortness of breath.  You have very bad pain in your upper belly. Summary  Belly (abdominal) pain is common during pregnancy. There are many possible causes.  If you have belly pain during pregnancy, tell your doctor right away.  Keep all follow-up visits as told by your doctor. This is important. This information is not intended to replace advice given to you by your health care provider. Make sure you discuss any questions you have with your health care provider. Document Revised: 12/03/2018 Document Reviewed: 11/17/2016 Elsevier Patient Education  2020 Elsevier Inc.  

## 2019-09-25 LAB — GC/CHLAMYDIA PROBE AMP (~~LOC~~) NOT AT ARMC
Chlamydia: NEGATIVE
Comment: NEGATIVE
Comment: NORMAL
Neisseria Gonorrhea: NEGATIVE

## 2019-09-25 LAB — CULTURE, OB URINE: Culture: NO GROWTH

## 2019-10-08 ENCOUNTER — Inpatient Hospital Stay (HOSPITAL_COMMUNITY)
Admission: AD | Admit: 2019-10-08 | Discharge: 2019-10-08 | Disposition: A | Discharge status: Home / Self Care | Payer: BC Managed Care – PPO | Attending: Obstetrics and Gynecology | Admitting: Obstetrics and Gynecology

## 2019-10-08 ENCOUNTER — Other Ambulatory Visit: Payer: Self-pay

## 2019-10-08 ENCOUNTER — Encounter (HOSPITAL_COMMUNITY): Payer: Self-pay | Admitting: Obstetrics and Gynecology

## 2019-10-08 DIAGNOSIS — O26892 Other specified pregnancy related conditions, second trimester: Secondary | ICD-10-CM | POA: Insufficient documentation

## 2019-10-08 DIAGNOSIS — Z3A20 20 weeks gestation of pregnancy: Secondary | ICD-10-CM | POA: Diagnosis not present

## 2019-10-08 DIAGNOSIS — O99412 Diseases of the circulatory system complicating pregnancy, second trimester: Secondary | ICD-10-CM | POA: Insufficient documentation

## 2019-10-08 DIAGNOSIS — Z0371 Encounter for suspected problem with amniotic cavity and membrane ruled out: Secondary | ICD-10-CM

## 2019-10-08 DIAGNOSIS — K589 Irritable bowel syndrome without diarrhea: Secondary | ICD-10-CM | POA: Diagnosis not present

## 2019-10-08 DIAGNOSIS — O99212 Obesity complicating pregnancy, second trimester: Secondary | ICD-10-CM | POA: Insufficient documentation

## 2019-10-08 DIAGNOSIS — Z79899 Other long term (current) drug therapy: Secondary | ICD-10-CM | POA: Insufficient documentation

## 2019-10-08 DIAGNOSIS — Z8249 Family history of ischemic heart disease and other diseases of the circulatory system: Secondary | ICD-10-CM | POA: Insufficient documentation

## 2019-10-08 DIAGNOSIS — Z79890 Hormone replacement therapy (postmenopausal): Secondary | ICD-10-CM | POA: Diagnosis not present

## 2019-10-08 DIAGNOSIS — Z87891 Personal history of nicotine dependence: Secondary | ICD-10-CM | POA: Insufficient documentation

## 2019-10-08 DIAGNOSIS — I4891 Unspecified atrial fibrillation: Secondary | ICD-10-CM | POA: Diagnosis not present

## 2019-10-08 DIAGNOSIS — O10912 Unspecified pre-existing hypertension complicating pregnancy, second trimester: Secondary | ICD-10-CM | POA: Diagnosis not present

## 2019-10-08 DIAGNOSIS — N898 Other specified noninflammatory disorders of vagina: Secondary | ICD-10-CM | POA: Insufficient documentation

## 2019-10-08 LAB — WET PREP, GENITAL
Clue Cells Wet Prep HPF POC: NONE SEEN
Sperm: NONE SEEN
Trich, Wet Prep: NONE SEEN
WBC, Wet Prep HPF POC: NONE SEEN
Yeast Wet Prep HPF POC: NONE SEEN

## 2019-10-08 LAB — URINALYSIS, ROUTINE W REFLEX MICROSCOPIC
Bacteria, UA: NONE SEEN
Bilirubin Urine: NEGATIVE
Glucose, UA: NEGATIVE mg/dL
Hgb urine dipstick: NEGATIVE
Ketones, ur: NEGATIVE mg/dL
Leukocytes,Ua: NEGATIVE
Nitrite: NEGATIVE
Protein, ur: 30 mg/dL — AB
Specific Gravity, Urine: 1.032 — ABNORMAL HIGH (ref 1.005–1.030)
pH: 5 (ref 5.0–8.0)

## 2019-10-08 LAB — POCT FERN TEST: POCT Fern Test: NEGATIVE

## 2019-10-08 MED ORDER — TERCONAZOLE 0.4 % VA CREA: 1.0000 | TOPICAL_CREAM | Freq: Every day | VAGINAL | 0 refills | Status: AC

## 2019-10-08 NOTE — MAU Note (Signed)
Has had some wet spots  In underwear since last night.  Has had period cramping.

## 2019-10-08 NOTE — MAU Provider Note (Addendum)
History     CSN: [id number]  Arrival date and time: 10/08/19 1656   First Provider Initiated Contact with Patient 10/08/19 1847      Chief Complaint  Patient presents with  . Abdominal Pain  . Rupture of Membranes   HPI  Ms.  Julie King is a 29 y.o. year old G34P1001 female at [redacted]w[redacted]d weeks gestation who presents to MAU reporting wet spots in her underwear last night and period like cramping. She reports when she take Tylenol, the cramping gets better. She denies VB. She is a [facility] pregnancy being managed by [facility]. She is trying to transfer her care to [facility], but has not been called by [facility] to get scheduled. She uses vaginal and oral progesterone daily. She denies urinating on herself.  Past Medical History:  Diagnosis Date  . Atrial fibrillation ([place])   . Headache 07/25/2017  . Hypertension   . Irritable bowel syndrome   . Morbidly obese ([place]) 07/25/2017  . QT prolongation   . T wave inversion in EKG     Past Surgical History:  Procedure Laterality Date  . CESAREAN SECTION    . CHOLECYSTECTOMY    . HERNIA REPAIR    . TONSILLECTOMY      Family History  Problem Relation Age of Onset  . Hypertension Other   . Diabetes Other   . Crohn's disease Mother   . Hypertension Mother   . Thyroid disease Father   . Diabetes Father   . Breast cancer Maternal Aunt        late 89 early 58s  . Breast cancer Maternal Grandmother        late 17s early 55    Social History   Tobacco Use  . Smoking status: Former Smoker    Packs/day: 0.50    Years: 10.00    Pack years: 5.00    Types: Cigarettes  . Smokeless tobacco: Never Used  . Tobacco comment: wants to quit but states very difficult Jan 2020  Substance Use Topics  . Alcohol use: Not Currently  . Drug use: No    Allergies:  Allergies  Allergen Reactions  . Percocet [Oxycodone-Acetaminophen] Hives, Itching and Nausea Only  . Pineapple Itching    Fresh pineapples; pineapples from a can is okay    Medications  Prior to Admission  Medication Sig Dispense Refill Last Dose  . Prenatal Vit-Fe Fumarate-FA (PRENATAL MULTIVITAMIN) TABS tablet Take 1 tablet by mouth daily at 12 noon.     . progesterone 200 MG SUPP Place 200 mg vaginally at bedtime.   10/07/2019 at Unknown time  . Progesterone Micronized (PROGESTERONE PO) Take by mouth.   10/07/2019 at Unknown time  . albuterol (PROVENTIL HFA;VENTOLIN HFA) 108 (90 Base) MCG/ACT inhaler Inhale 2 puffs into the lungs every 6 (six) hours as needed for wheezing or shortness of breath. 1 Inhaler 5 More than a month at Unknown time  . Cetirizine HCl 10 MG CAPS Take 1 capsule (10 mg total) by mouth daily for 10 days. 10 capsule 0   . progesterone (ENDOMETRIN) 100 MG vaginal insert Place 100 mg vaginally 2 (two) times daily.       Review of Systems  Constitutional: Negative.   HENT: Negative.   Eyes: Negative.   Respiratory: Negative.   Cardiovascular: Negative.   Gastrointestinal: Negative.   Endocrine: Negative.   Genitourinary: Positive for pelvic pain (abdominal cramping; "like a period") and vaginal discharge.  Musculoskeletal: Negative.   Skin: Negative.   Allergic/Immunologic:  Negative.   Neurological: Negative.   Hematological: Negative.   Psychiatric/Behavioral: Negative.    Physical Exam   Blood pressure 124/66, pulse (!) 105, temperature 98.7 F (37.1 C), temperature source Oral, resp. rate 18, height 5\' 7"  (1.702 m), weight 135 kg, last menstrual period 06/02/2019, SpO2 100 %.  Physical Exam  Nursing note and vitals reviewed. Constitutional: She is oriented to person, place, and time. She appears well-developed and well-nourished.  HENT:  Head: Normocephalic and atraumatic.  Eyes: Pupils are equal, round, and reactive to light.  Cardiovascular: Normal rate.  Respiratory: Effort normal.  GI: Soft.  Genitourinary:    Genitourinary Comments: Uterus: gravid, S>D, SE: cervix is smooth, pink, no lesions, small amt of watery and chunky, white  vaginal d/c -- WP, GC/CT done, closed/long/firm, no CMT or friability, no adnexal tenderness    Musculoskeletal:        General: Normal range of motion.     Cervical back: Normal range of motion.  Neurological: She is alert and oriented to person, place, and time.  Skin: Skin is warm and dry.  Psychiatric: She has a normal mood and affect. Her behavior is normal. Judgment and thought content normal.   FHTs by doppler: 150 bpm  MAU Course  Procedures  MDM CCUA Wet Prep GC/CT -- Results pending   Results for orders placed or performed during the hospital encounter of 10/08/19 (from the past 24 hour(s))  Urinalysis, Routine w reflex microscopic     Status: Abnormal   Collection Time: 10/08/19  5:31 PM  Result Value Ref Range   Color, Urine YELLOW YELLOW   APPearance CLEAR CLEAR   Specific Gravity, Urine 1.032 (H) 1.005 - 1.030   pH 5.0 5.0 - 8.0   Glucose, UA NEGATIVE NEGATIVE mg/dL   Hgb urine dipstick NEGATIVE NEGATIVE   Bilirubin Urine NEGATIVE NEGATIVE   Ketones, ur NEGATIVE NEGATIVE mg/dL   Protein, ur 30 (A) NEGATIVE mg/dL   Nitrite NEGATIVE NEGATIVE   Leukocytes,Ua NEGATIVE NEGATIVE   RBC / HPF 0-5 0 - 5 RBC/hpf   WBC, UA 0-5 0 - 5 WBC/hpf   Bacteria, UA NONE SEEN NONE SEEN   Squamous Epithelial / LPF 0-5 0 - 5   Mucus PRESENT     Assessment and Plan  No leakage of amniotic fluid into vagina - Reassurance that the leaking she felt was not from leaking amniotic fluid - Advised to return to MAU, if she thinks she leaks again  Vaginal discharge  - Rx for Terazol cream vaginally hs x 7 days - Advised that d/c could possibly be from progesterone nightly inserts, but exam is inconclusive - Information provided on vaginal yeast   - Discharge patient - Keep scheduled appt on 10/15/2019 with [facility] dept for [facility] - Patient verbalized an understanding of the plan of care and agrees.    Laury Deep, MSN,CNM 10/08/2019, 6:47 PM

## 2019-10-09 LAB — GC/CHLAMYDIA PROBE AMP (~~LOC~~) NOT AT ARMC
Chlamydia: NEGATIVE
Comment: NEGATIVE
Comment: NORMAL
Neisseria Gonorrhea: NEGATIVE

## 2019-11-01 ENCOUNTER — Inpatient Hospital Stay (HOSPITAL_COMMUNITY)
Admission: AD | Admit: 2019-11-01 | Discharge: 2019-11-01 | Disposition: A | Discharge status: Home / Self Care | Payer: BC Managed Care – PPO | Attending: Obstetrics & Gynecology | Admitting: Obstetrics & Gynecology

## 2019-11-01 ENCOUNTER — Other Ambulatory Visit: Payer: Self-pay

## 2019-11-01 ENCOUNTER — Encounter (HOSPITAL_COMMUNITY): Payer: Self-pay | Admitting: Obstetrics & Gynecology

## 2019-11-01 DIAGNOSIS — R030 Elevated blood-pressure reading, without diagnosis of hypertension: Secondary | ICD-10-CM | POA: Diagnosis present

## 2019-11-01 DIAGNOSIS — O368120 Decreased fetal movements, second trimester, not applicable or unspecified: Secondary | ICD-10-CM | POA: Insufficient documentation

## 2019-11-01 DIAGNOSIS — Z3A23 23 weeks gestation of pregnancy: Secondary | ICD-10-CM | POA: Diagnosis not present

## 2019-11-01 DIAGNOSIS — O10912 Unspecified pre-existing hypertension complicating pregnancy, second trimester: Secondary | ICD-10-CM

## 2019-11-01 DIAGNOSIS — O10012 Pre-existing essential hypertension complicating pregnancy, second trimester: Secondary | ICD-10-CM | POA: Diagnosis not present

## 2019-11-01 DIAGNOSIS — Z87891 Personal history of nicotine dependence: Secondary | ICD-10-CM | POA: Insufficient documentation

## 2019-11-01 DIAGNOSIS — Z885 Allergy status to narcotic agent status: Secondary | ICD-10-CM | POA: Diagnosis not present

## 2019-11-01 LAB — CBC WITH DIFFERENTIAL/PLATELET
Abs Immature Granulocytes: 0.06 10*3/uL (ref 0.00–0.07)
Basophils Absolute: 0.1 10*3/uL (ref 0.0–0.1)
Basophils Relative: 0 %
Eosinophils Absolute: 0.2 10*3/uL (ref 0.0–0.5)
Eosinophils Relative: 2 %
HCT: 31.5 % — ABNORMAL LOW (ref 36.0–46.0)
Hemoglobin: 10 g/dL — ABNORMAL LOW (ref 12.0–15.0)
Immature Granulocytes: 1 %
Lymphocytes Relative: 24 %
Lymphs Abs: 2.8 10*3/uL (ref 0.7–4.0)
MCH: 20.2 pg — ABNORMAL LOW (ref 26.0–34.0)
MCHC: 31.7 g/dL (ref 30.0–36.0)
MCV: 63.5 fL — ABNORMAL LOW (ref 80.0–100.0)
Monocytes Absolute: 0.7 10*3/uL (ref 0.1–1.0)
Monocytes Relative: 6 %
Neutro Abs: 7.7 10*3/uL (ref 1.7–7.7)
Neutrophils Relative %: 67 %
Platelets: 217 10*3/uL (ref 150–400)
RBC: 4.96 MIL/uL (ref 3.87–5.11)
RDW: 16.6 % — ABNORMAL HIGH (ref 11.5–15.5)
WBC Morphology: >10
WBC: 11.5 10*3/uL — ABNORMAL HIGH (ref 4.0–10.5)
nRBC: 0.4 % — ABNORMAL HIGH (ref 0.0–0.2)

## 2019-11-01 LAB — URINALYSIS, ROUTINE W REFLEX MICROSCOPIC
Bilirubin Urine: NEGATIVE
Glucose, UA: NEGATIVE mg/dL
Hgb urine dipstick: NEGATIVE
Ketones, ur: NEGATIVE mg/dL
Leukocytes,Ua: NEGATIVE
Nitrite: NEGATIVE
Protein, ur: NEGATIVE mg/dL
Specific Gravity, Urine: 1.02 (ref 1.005–1.030)
pH: 6 (ref 5.0–8.0)

## 2019-11-01 LAB — COMPREHENSIVE METABOLIC PANEL
ALT: 24 U/L (ref 0–44)
AST: 16 U/L (ref 15–41)
Albumin: 2.8 g/dL — ABNORMAL LOW (ref 3.5–5.0)
Alkaline Phosphatase: 61 U/L (ref 38–126)
Anion gap: 8 (ref 5–15)
BUN: <5 mg/dL — ABNORMAL LOW (ref 6–20)
CO2: 21 mmol/L — ABNORMAL LOW (ref 22–32)
Calcium: 9 mg/dL (ref 8.9–10.3)
Chloride: 108 mmol/L (ref 98–111)
Creatinine, Ser: 0.51 mg/dL (ref 0.44–1.00)
GFR calc Af Amer: >60 mL/min (ref >60)
GFR calc non Af Amer: >60 mL/min (ref >60)
Glucose, Bld: 81 mg/dL (ref 70–99)
Potassium: 3.7 mmol/L (ref 3.5–5.1)
Sodium: 137 mmol/L (ref 135–145)
Total Bilirubin: 0.5 mg/dL (ref 0.3–1.2)
Total Protein: 6.5 g/dL (ref 6.5–8.1)

## 2019-11-01 LAB — PROTEIN / CREATININE RATIO, URINE
Creatinine, Urine: 227.91 mg/dL
Protein Creatinine Ratio: 0.08 mg/mg{Cre} (ref 0.00–0.15)
Total Protein, Urine: 18 mg/dL

## 2019-11-01 MED ORDER — LABETALOL HCL 100 MG PO TABS: 200.0000 mg | ORAL_TABLET | Freq: Two times a day (BID) | ORAL | Status: DC

## 2019-11-01 MED ORDER — ACETAMINOPHEN 325 MG PO TABS
650.0000 mg | ORAL_TABLET | Freq: Four times a day (QID) | ORAL | Status: DC | PRN
  Administered 2019-11-01: 16:00:00 650 mg via ORAL
  Filled 2019-11-01: qty 2

## 2019-11-01 MED ORDER — ASPIRIN 81 MG PO CHEW: 81.0000 mg | CHEWABLE_TABLET | Freq: Every day | ORAL | Status: DC

## 2019-11-01 NOTE — Discharge Instructions (Signed)

## 2019-11-01 NOTE — MAU Provider Note (Signed)
Patient Julie King is a 29 y.o. G2P1001 at [redacted]w[redacted]d here with complaints of elevated BP at home (160/110), swelling of her feet and decreased fetal movements. She called the OB practice she goes to in [place]- and they said to come to MAU for evaluation and check fetal movements. When she did not feel 10 movements in 2 hours, she came in for evaluation.   She denies vaginal bleeding, LOF, vaginal discharge, contractions or other OB-GYN complaints.   She has A-fib and QT inversion and QT prolongation. She has had these problems for a while. She is not on medicine. Per patient, cardiologist does not want her to start medicine, and she is waiting for a call for Holter monitor and echo. She denies any tachycardia, SOB, palpitations or other concerns.   History     CSN: [id number]  Arrival date and time: 11/01/19 1254   First Provider Initiated Contact with Patient 11/01/19 1401      Chief Complaint  Patient presents with  . Decreased Fetal Movement  . Headache  . Hypertension  . Foot Swelling   Headache  This is a new problem. The current episode started today. The pain does not radiate. The quality of the pain is described as throbbing. The pain is at a severity of 3/10. Pertinent negatives include no abdominal pain, blurred vision, coughing, dizziness, vomiting or weakness.   Patient reports that she has had swelling in her feet; she does not have any RUQ pain, blurry vision, floating spots. She has a small HA which was not bothering her this morning.  She initially checked her blood pressure this morning after she noticed that her feet were swelling. She used two different cuffs, one wrist cuff (165/102). She waited 25 minutes and then she took it again: 152/89    OB History    Gravida  2   Para  1   Term  1   Preterm      AB      Living  1     SAB      TAB      Ectopic      Multiple      Live Births  1           Past Medical History:  Diagnosis Date   . Atrial fibrillation ([place])   . Headache 07/25/2017  . Hypertension   . Irritable bowel syndrome   . Morbidly obese ([place]) 07/25/2017  . QT prolongation   . T wave inversion in EKG     Past Surgical History:  Procedure Laterality Date  . CESAREAN SECTION    . CHOLECYSTECTOMY    . HERNIA REPAIR    . TONSILLECTOMY      Family History  Problem Relation Age of Onset  . Hypertension Other   . Diabetes Other   . Crohn's disease Mother   . Hypertension Mother   . Thyroid disease Father   . Diabetes Father   . Breast cancer Maternal Aunt        late 23 early 69s  . Breast cancer Maternal Grandmother        late 64s early 25    Social History   Tobacco Use  . Smoking status: Former Smoker    Packs/day: 0.50    Years: 10.00    Pack years: 5.00    Types: Cigarettes  . Smokeless tobacco: Never Used  . Tobacco comment: wants to quit but states very difficult Jan 2020  Substance Use Topics  . Alcohol use: Not Currently  . Drug use: No    Allergies:  Allergies  Allergen Reactions  . Percocet [Oxycodone-Acetaminophen] Hives, Itching and Nausea Only  . Pineapple Itching    Fresh pineapples; pineapples from a can is okay    Medications Prior to Admission  Medication Sig Dispense Refill Last Dose  . Prenatal Vit-Fe Fumarate-FA (PRENATAL MULTIVITAMIN) TABS tablet Take 1 tablet by mouth daily at 12 noon.   11/01/2019 at Unknown time  . progesterone (ENDOMETRIN) 100 MG vaginal insert Place 100 mg vaginally 2 (two) times daily.   10/31/2019 at Unknown time  . progesterone 200 MG SUPP Place 200 mg vaginally at bedtime.   10/31/2019 at Unknown time  . Progesterone Micronized (PROGESTERONE PO) Take by mouth.   10/31/2019 at Unknown time  . albuterol (PROVENTIL HFA;VENTOLIN HFA) 108 (90 Base) MCG/ACT inhaler Inhale 2 puffs into the lungs every 6 (six) hours as needed for wheezing or shortness of breath. 1 Inhaler 5 More than a month at Unknown time  . Cetirizine HCl 10 MG CAPS Take 1  capsule (10 mg total) by mouth daily for 10 days. 10 capsule 0     Review of Systems  Constitutional: Negative.   HENT: Negative.   Eyes: Negative for blurred vision.  Respiratory: Negative.  Negative for cough.   Cardiovascular: Negative.   Gastrointestinal: Negative.  Negative for abdominal pain and vomiting.  Genitourinary: Negative.   Musculoskeletal: Negative.   Neurological: Positive for headaches. Negative for dizziness and weakness.  Hematological: Negative.   Psychiatric/Behavioral: Negative.    Physical Exam   Blood pressure 134/68, pulse 95, temperature 99.1 F (37.3 C), temperature source Oral, resp. rate 18, weight 133.9 kg, last menstrual period 06/02/2019, SpO2 98 %.  Physical Exam  Constitutional: She is oriented to person, place, and time. She appears well-developed and well-nourished.  HENT:  Head: Normocephalic.  Respiratory: Effort normal.  GI: Soft.  Musculoskeletal:        General: Normal range of motion.     Cervical back: Normal range of motion.  Neurological: She is alert and oriented to person, place, and time.    MAU Course  Procedures  MDM -NST: 140 bpm, mod var, present acel, neg decels, no contractions. She feels strong movements while in MAU.  -CBC and CMP are normal.  -PCR is normal  1556: HA now a 6/10; will try tylenol 650 (patient request) and PO hydration.   HA is resolved; patient desires discharge.    Assessment and Plan   1. Chronic hypertension in obstetric context in second trimester   -discussed with Dr. Harolyn Rutherford, who recommends discharge and start on 200 mg BID of labetalol.  -keep appt at [facility] on 11-05-2019 -will send message to [name] (patient's preferred site) to start high risk prenatal care.  -follow up with cardiologist as scheduled.   Julie King 11/01/2019, 2:06 PM

## 2019-11-01 NOTE — MAU Note (Signed)
Sent in by DR, BP was elevated. Feet have swelling a little bit.  Was instructed to do kick counts, only getting 4/hr instead of the 10, now he only moves when "she makes him move". Small HA, denies visual changes, epigastric pain.

## 2019-11-02 ENCOUNTER — Other Ambulatory Visit: Payer: Self-pay | Admitting: Certified Nurse Midwife

## 2019-11-02 DIAGNOSIS — O10912 Unspecified pre-existing hypertension complicating pregnancy, second trimester: Secondary | ICD-10-CM

## 2019-11-02 MED ORDER — LABETALOL HCL 200 MG PO TABS: 200.0000 mg | ORAL_TABLET | Freq: Two times a day (BID) | ORAL | 3 refills | Status: DC

## 2019-11-28 ENCOUNTER — Ambulatory Visit (INDEPENDENT_AMBULATORY_CARE_PROVIDER_SITE_OTHER): Payer: BC Managed Care – PPO | Admitting: Obstetrics and Gynecology

## 2019-11-28 ENCOUNTER — Encounter: Payer: Self-pay | Admitting: Obstetrics and Gynecology

## 2019-11-28 ENCOUNTER — Other Ambulatory Visit (HOSPITAL_COMMUNITY)
Admission: RE | Admit: 2019-11-28 | Discharge: 2019-11-28 | Disposition: A | Discharge status: Home / Self Care | Payer: BC Managed Care – PPO | Source: Ambulatory Visit | Attending: Obstetrics and Gynecology | Admitting: Obstetrics and Gynecology

## 2019-11-28 ENCOUNTER — Other Ambulatory Visit: Payer: Self-pay

## 2019-11-28 DIAGNOSIS — Z3482 Encounter for supervision of other normal pregnancy, second trimester: Secondary | ICD-10-CM | POA: Diagnosis not present

## 2019-11-28 DIAGNOSIS — Z3480 Encounter for supervision of other normal pregnancy, unspecified trimester: Secondary | ICD-10-CM

## 2019-11-28 DIAGNOSIS — O34219 Maternal care for unspecified type scar from previous cesarean delivery: Secondary | ICD-10-CM | POA: Insufficient documentation

## 2019-11-28 DIAGNOSIS — Z3481 Encounter for supervision of other normal pregnancy, first trimester: Secondary | ICD-10-CM | POA: Diagnosis not present

## 2019-11-28 DIAGNOSIS — O99013 Anemia complicating pregnancy, third trimester: Secondary | ICD-10-CM | POA: Insufficient documentation

## 2019-11-28 DIAGNOSIS — O10919 Unspecified pre-existing hypertension complicating pregnancy, unspecified trimester: Secondary | ICD-10-CM

## 2019-11-28 DIAGNOSIS — Z8679 Personal history of other diseases of the circulatory system: Secondary | ICD-10-CM

## 2019-11-28 DIAGNOSIS — O99210 Obesity complicating pregnancy, unspecified trimester: Secondary | ICD-10-CM | POA: Insufficient documentation

## 2019-11-28 NOTE — Patient Instructions (Signed)
Third Trimester of Pregnancy The third trimester is from week 28 through week 40 (months 7 through 9). The third trimester is a time when the unborn baby (fetus) is growing rapidly. At the end of the ninth month, the fetus is about 20 inches in length and weighs 6-10 pounds. Body changes during your third trimester Your body will continue to go through many changes during pregnancy. The changes vary from woman to woman. During the third trimester:  Your weight will continue to increase. You can expect to gain 25-35 pounds (11-16 kg) by the end of the pregnancy.  You may begin to get stretch marks on your hips, abdomen, and breasts.  You may urinate more often because the fetus is moving lower into your pelvis and pressing on your bladder.  You may develop or continue to have heartburn. This is caused by increased hormones that slow down muscles in the digestive tract.  You may develop or continue to have constipation because increased hormones slow digestion and cause the muscles that push waste through your intestines to relax.  You may develop hemorrhoids. These are swollen veins (varicose veins) in the rectum that can itch or be painful.  You may develop swollen, bulging veins (varicose veins) in your legs.  You may have increased body aches in the pelvis, back, or thighs. This is due to weight gain and increased hormones that are relaxing your joints.  You may have changes in your hair. These can include thickening of your hair, rapid growth, and changes in texture. Some women also have hair loss during or after pregnancy, or hair that feels dry or thin. Your hair will most likely return to normal after your baby is born.  Your breasts will continue to grow and they will continue to become tender. A yellow fluid (colostrum) may leak from your breasts. This is the first milk you are producing for your baby.  Your belly button may stick out.  You may notice more swelling in your  hands, face, or ankles.  You may have increased tingling or numbness in your hands, arms, and legs. The skin on your belly may also feel numb.  You may feel short of breath because of your expanding uterus.  You may have more problems sleeping. This can be caused by the size of your belly, increased need to urinate, and an increase in your body's metabolism.  You may notice the fetus "dropping," or moving lower in your abdomen (lightening).  You may have increased vaginal discharge.  You may notice your joints feel loose and you may have pain around your pelvic bone. What to expect at prenatal visits You will have prenatal exams every 2 weeks until week 36. Then you will have weekly prenatal exams. During a routine prenatal visit:  You will be weighed to make sure you and the baby are growing normally.  Your blood pressure will be taken.  Your abdomen will be measured to track your baby's growth.  The fetal heartbeat will be listened to.  Any test results from the previous visit will be discussed.  You may have a cervical check near your due date to see if your cervix has softened or thinned (effaced).  You will be tested for Group B streptococcus. This happens between 35 and 37 weeks. Your health care provider may ask you:  What your birth plan is.  How you are feeling.  If you are feeling the baby move.  If you have had any  abnormal symptoms, such as leaking fluid, bleeding, severe headaches, or abdominal cramping.  If you are using any tobacco products, including cigarettes, chewing tobacco, and electronic cigarettes.  If you have any questions. Other tests or screenings that may be performed during your third trimester include:  Blood tests that check for low iron levels (anemia).  Fetal testing to check the health, activity level, and growth of the fetus. Testing is done if you have certain medical conditions or if there are problems during the  pregnancy.  Nonstress test (NST). This test checks the health of your baby to make sure there are no signs of problems, such as the baby not getting enough oxygen. During this test, a belt is placed around your belly. The baby is made to move, and its heart rate is monitored during movement. What is false labor? False labor is a condition in which you feel small, irregular tightenings of the muscles in the womb (contractions) that usually go away with rest, changing position, or drinking water. These are called Braxton Hicks contractions. Contractions may last for hours, days, or even weeks before true labor sets in. If contractions come at regular intervals, become more frequent, increase in intensity, or become painful, you should see your health care provider. What are the signs of labor?  Abdominal cramps.  Regular contractions that start at 10 minutes apart and become stronger and more frequent with time.  Contractions that start on the top of the uterus and spread down to the lower abdomen and back.  Increased pelvic pressure and dull back pain.  A watery or bloody mucus discharge that comes from the vagina.  Leaking of amniotic fluid. This is also known as your "water breaking." It could be a slow trickle or a gush. Let your health care provider know if it has a color or strange odor. If you have any of these signs, call your health care provider right away, even if it is before your due date. Follow these instructions at home: Medicines  Follow your health care provider's instructions regarding medicine use. Specific medicines may be either safe or unsafe to take during pregnancy.  Take a prenatal vitamin that contains at least 600 micrograms (mcg) of folic acid.  If you develop constipation, try taking a stool softener if your health care provider approves. Eating and drinking   Eat a balanced diet that includes fresh fruits and vegetables, whole grains, good sources of protein  such as meat, eggs, or tofu, and low-fat dairy. Your health care provider will help you determine the amount of weight gain that is right for you.  Avoid raw meat and uncooked cheese. These carry germs that can cause birth defects in the baby.  If you have low calcium intake from food, talk to your health care provider about whether you should take a daily calcium supplement.  Eat four or five small meals rather than three large meals a day.  Limit foods that are high in fat and processed sugars, such as fried and sweet foods.  To prevent constipation: ? Drink enough fluid to keep your urine clear or pale yellow. ? Eat foods that are high in fiber, such as fresh fruits and vegetables, whole grains, and beans. Activity  Exercise only as directed by your health care provider. Most women can continue their usual exercise routine during pregnancy. Try to exercise for 30 minutes at least 5 days a week. Stop exercising if you experience uterine contractions.  Avoid heavy lifting.    Do not exercise in extreme heat or humidity, or at high altitudes.  Wear low-heel, comfortable shoes.  Practice good posture.  You may continue to have sex unless your health care provider tells you otherwise. Relieving pain and discomfort  Take frequent breaks and rest with your legs elevated if you have leg cramps or low back pain.  Take warm sitz baths to soothe any pain or discomfort caused by hemorrhoids. Use hemorrhoid cream if your health care provider approves.  Wear a good support bra to prevent discomfort from breast tenderness.  If you develop varicose veins: ? Wear support pantyhose or compression stockings as told by your healthcare provider. ? Elevate your feet for 15 minutes, 3-4 times a day. Prenatal care  Write down your questions. Take them to your prenatal visits.  Keep all your prenatal visits as told by your health care provider. This is important. Safety  Wear your seat belt at  all times when driving.  Make a list of emergency phone numbers, including numbers for family, friends, the hospital, and police and fire departments. General instructions  Avoid cat litter boxes and soil used by cats. These carry germs that can cause birth defects in the baby. If you have a cat, ask someone to clean the litter box for you.  Do not travel far distances unless it is absolutely necessary and only with the approval of your health care provider.  Do not use hot tubs, steam rooms, or saunas.  Do not drink alcohol.  Do not use any products that contain nicotine or tobacco, such as cigarettes and e-cigarettes. If you need help quitting, ask your health care provider.  Do not use any medicinal herbs or unprescribed drugs. These chemicals affect the formation and growth of the baby.  Do not douche or use tampons or scented sanitary pads.  Do not cross your legs for long periods of time.  To prepare for the arrival of your baby: ? Take prenatal classes to understand, practice, and ask questions about labor and delivery. ? Make a trial run to the hospital. ? Visit the hospital and tour the maternity area. ? Arrange for maternity or paternity leave through employers. ? Arrange for family and friends to take care of pets while you are in the hospital. ? Purchase a rear-facing car seat and make sure you know how to install it in your car. ? Pack your hospital bag. ? Prepare the baby's nursery. Make sure to remove all pillows and stuffed animals from the baby's crib to prevent suffocation.  Visit your dentist if you have not gone during your pregnancy. Use a soft toothbrush to brush your teeth and be gentle when you floss. Contact a health care provider if:  You are unsure if you are in labor or if your water has broken.  You become dizzy.  You have mild pelvic cramps, pelvic pressure, or nagging pain in your abdominal area.  You have lower back pain.  You have persistent  nausea, vomiting, or diarrhea.  You have an unusual or bad smelling vaginal discharge.  You have pain when you urinate. Get help right away if:  Your water breaks before 37 weeks.  You have regular contractions less than 5 minutes apart before 37 weeks.  You have a fever.  You are leaking fluid from your vagina.  You have spotting or bleeding from your vagina.  You have severe abdominal pain or cramping.  You have rapid weight loss or weight gain.  You   have shortness of breath with chest pain.  You notice sudden or extreme swelling of your face, hands, ankles, feet, or legs.  Your baby makes fewer than 10 movements in 2 hours.  You have severe headaches that do not go away when you take medicine.  You have vision changes. Summary  The third trimester is from week 28 through week 40, months 7 through 9. The third trimester is a time when the unborn baby (fetus) is growing rapidly.  During the third trimester, your discomfort may increase as you and your baby continue to gain weight. You may have abdominal, leg, and back pain, sleeping problems, and an increased need to urinate.  During the third trimester your breasts will keep growing and they will continue to become tender. A yellow fluid (colostrum) may leak from your breasts. This is the first milk you are producing for your baby.  False labor is a condition in which you feel small, irregular tightenings of the muscles in the womb (contractions) that eventually go away. These are called Braxton Hicks contractions. Contractions may last for hours, days, or even weeks before true labor sets in.  Signs of labor can include: abdominal cramps; regular contractions that start at 10 minutes apart and become stronger and more frequent with time; watery or bloody mucus discharge that comes from the vagina; increased pelvic pressure and dull back pain; and leaking of amniotic fluid. This information is not intended to replace advice  given to you by your health care provider. Make sure you discuss any questions you have with your health care provider. Document Revised: 12/06/2018 Document Reviewed: 09/20/2016 Elsevier Patient Education  2020 [employer]   Contraception Choices Contraception, also called birth control, refers to methods or devices that prevent pregnancy. Hormonal methods Contraceptive implant  A contraceptive implant is a thin, plastic tube that contains a hormone. It is inserted into the upper part of the arm. It can remain in place for up to 3 years. Progestin-only injections Progestin-only injections are injections of progestin, a synthetic form of the hormone progesterone. They are given every 3 months by a health care provider. Birth control pills  Birth control pills are pills that contain hormones that prevent pregnancy. They must be taken once a day, preferably at the same time each day. Birth control patch  The birth control patch contains hormones that prevent pregnancy. It is placed on the skin and must be changed once a week for three weeks and removed on the fourth week. A prescription is needed to use this method of contraception. Vaginal ring  A vaginal ring contains hormones that prevent pregnancy. It is placed in the vagina for three weeks and removed on the fourth week. After that, the process is repeated with a new ring. A prescription is needed to use this method of contraception. Emergency contraceptive Emergency contraceptives prevent pregnancy after unprotected sex. They come in pill form and can be taken up to 5 days after sex. They work best the sooner they are taken after having sex. Most emergency contraceptives are available without a prescription. This method should not be used as your only form of birth control. Barrier methods Female condom  A female condom is a thin sheath that is worn over the penis during sex. Condoms keep sperm from going inside a woman's body. They can  be used with a spermicide to increase their effectiveness. They should be disposed after a single use. Female condom  A female condom is a soft,   loose-fitting sheath that is put into the vagina before sex. The condom keeps sperm from going inside a woman's body. They should be disposed after a single use. Diaphragm  A diaphragm is a soft, dome-shaped barrier. It is inserted into the vagina before sex, along with a spermicide. The diaphragm blocks sperm from entering the uterus, and the spermicide kills sperm. A diaphragm should be left in the vagina for 6-8 hours after sex and removed within 24 hours. A diaphragm is prescribed and fitted by a health care provider. A diaphragm should be replaced every 1-2 years, after giving birth, after gaining more than 15 lb (6.8 kg), and after pelvic surgery. Cervical cap  A cervical cap is a round, soft latex or plastic cup that fits over the cervix. It is inserted into the vagina before sex, along with spermicide. It blocks sperm from entering the uterus. The cap should be left in place for 6-8 hours after sex and removed within 48 hours. A cervical cap must be prescribed and fitted by a health care provider. It should be replaced every 2 years. Sponge  A sponge is a soft, circular piece of polyurethane foam with spermicide on it. The sponge helps block sperm from entering the uterus, and the spermicide kills sperm. To use it, you make it wet and then insert it into the vagina. It should be inserted before sex, left in for at least 6 hours after sex, and removed and thrown away within 30 hours. Spermicides Spermicides are chemicals that kill or block sperm from entering the cervix and uterus. They can come as a cream, jelly, suppository, foam, or tablet. A spermicide should be inserted into the vagina with an applicator at least 10-15 minutes before sex to allow time for it to work. The process must be repeated every time you have sex. Spermicides do not require  a prescription. Intrauterine contraception Intrauterine device (IUD) An IUD is a T-shaped device that is put in a woman's uterus. There are two types:  Hormone IUD.This type contains progestin, a synthetic form of the hormone progesterone. This type can stay in place for 3-5 years.  Copper IUD.This type is wrapped in copper wire. It can stay in place for 10 years.  Permanent methods of contraception Female tubal ligation In this method, a woman's fallopian tubes are sealed, tied, or blocked during surgery to prevent eggs from traveling to the uterus. Hysteroscopic sterilization In this method, a small, flexible insert is placed into each fallopian tube. The inserts cause scar tissue to form in the fallopian tubes and block them, so sperm cannot reach an egg. The procedure takes about 3 months to be effective. Another form of birth control must be used during those 3 months. Female sterilization This is a procedure to tie off the tubes that carry sperm (vasectomy). After the procedure, the man can still ejaculate fluid (semen). Natural planning methods Natural family planning In this method, a couple does not have sex on days when the woman could become pregnant. Calendar method This means keeping track of the length of each menstrual cycle, identifying the days when pregnancy can happen, and not having sex on those days. Ovulation method In this method, a couple avoids sex during ovulation. Symptothermal method This method involves not having sex during ovulation. The woman typically checks for ovulation by watching changes in her temperature and in the consistency of cervical mucus. Post-ovulation method In this method, a couple waits to have sex until after ovulation. Summary    Contraception, also called birth control, means methods or devices that prevent pregnancy.  Hormonal methods of contraception include implants, injections, pills, patches, vaginal rings, and emergency  contraceptives.  Barrier methods of contraception can include female condoms, female condoms, diaphragms, cervical caps, sponges, and spermicides.  There are two types of IUDs (intrauterine devices). An IUD can be put in a woman's uterus to prevent pregnancy for 3-5 years.  Permanent sterilization can be done through a procedure for males, females, or both.  Natural family planning methods involve not having sex on days when the woman could become pregnant. This information is not intended to replace advice given to you by your health care provider. Make sure you discuss any questions you have with your health care provider. Document Revised: 08/17/2017 Document Reviewed: 09/17/2016 Elsevier Patient Education  2020 [employer].   Postpartum Baby Blues The postpartum period begins right after the birth of a baby. During this time, there is often a lot of joy and excitement. It is also a time of many changes in the life of the parents. No matter how many times a mother gives birth, each child brings new challenges to the family, including different ways of relating to one another. It is common to have feelings of excitement along with confusing changes in moods, emotions, and thoughts. You may feel happy one minute and sad or stressed the next. These feelings of sadness usually happen in the period right after you have your baby, and they go away within a week or two. This is called the "baby blues." What are the causes? There is no known cause of baby blues. It is likely caused by a combination of factors. However, changes in hormone levels after childbirth are believed to trigger some of the symptoms. Other factors that can play a role in these mood changes include:  Lack of sleep.  Stressful life events, such as poverty, caring for a loved one, or death of a loved one.  Genetics. What are the signs or symptoms? Symptoms of this condition include:  Brief changes in mood, such as going  from extreme happiness to sadness.  Decreased concentration.  Difficulty sleeping.  Crying spells and tearfulness.  Loss of appetite.  Irritability.  Anxiety. If the symptoms of baby blues last for more than 2 weeks or become more severe, you may have postpartum depression. How is this diagnosed? This condition is diagnosed based on an evaluation of your symptoms. There are no medical or lab tests that lead to a diagnosis, but there are various questionnaires that a health care provider may use to identify women with the baby blues or postpartum depression. How is this treated? Treatment is not needed for this condition. The baby blues usually go away on their own in 1-2 weeks. Social support is often all that is needed. You will be encouraged to get adequate sleep and rest. Follow these instructions at home: Lifestyle      Get as much rest as you can. Take a nap when the baby sleeps.  Exercise regularly as told by your health care provider. Some women find yoga and walking to be helpful.  Eat a balanced and nourishing diet. This includes plenty of fruits and vegetables, whole grains, and lean proteins.  Do little things that you enjoy. Have a cup of tea, take a bubble bath, read your favorite magazine, or listen to your favorite music.  Avoid alcohol.  Ask for help with household chores, cooking, grocery shopping, or running errands.  Do not try to do everything yourself. Consider hiring a postpartum doula to help. This is a professional who specializes in providing support to new mothers.  Try not to make any major life changes during pregnancy or right after giving birth. This can add stress. General instructions  Talk to people close to you about how you are feeling. Get support from your partner, family members, friends, or other new moms. You may want to join a support group.  Find ways to cope with stress. This may include: ? Writing your thoughts and feelings in a  journal. ? Spending time outside. ? Spending time with people who make you laugh.  Try to stay positive in how you think. Think about the things you are grateful for.  Take over-the-counter and prescription medicines only as told by your health care provider.  Let your health care provider know if you have any concerns.  Keep all postpartum visits as told by your health care provider. This is important. Contact a health care provider if:  Your baby blues do not go away after 2 weeks. Get help right away if:  You have thoughts of taking your own life (suicidal thoughts).  You think you may harm the baby or other people.  You see or hear things that are not there (hallucinations). Summary  After giving birth, you may feel happy one minute and sad or stressed the next. Feelings of sadness that happen right after the baby is born and go away after a week or two are called the "baby blues."  You can manage the baby blues by getting enough rest, eating a healthy diet, exercising, spending time with supportive people, and finding ways to cope with stress.  If feelings of sadness and stress last longer than 2 weeks or get in the way of caring for your baby, talk to your health care provider. This may mean you have postpartum depression. This information is not intended to replace advice given to you by your health care provider. Make sure you discuss any questions you have with your health care provider. Document Revised: 12/07/2018 Document Reviewed: 10/11/2016 Elsevier Patient Education  [employer].   Breastfeeding  Choosing to breastfeed is one of the best decisions you can make for yourself and your baby. A change in hormones during pregnancy causes your breasts to make breast milk in your milk-producing glands. Hormones prevent breast milk from being released before your baby is born. They also prompt milk flow after birth. Once breastfeeding has begun, thoughts of your baby,  as well as his or her sucking or crying, can stimulate the release of milk from your milk-producing glands. Benefits of breastfeeding Research shows that breastfeeding offers many health benefits for infants and mothers. It also offers a cost-free and convenient way to feed your baby. For your baby  Your first milk (colostrum) helps your baby's digestive system to function better.  Special cells in your milk (antibodies) help your baby to fight off infections.  Breastfed babies are less likely to develop asthma, allergies, obesity, or type 2 diabetes. They are also at lower risk for sudden infant death syndrome (SIDS).  Nutrients in breast milk are better able to meet your baby's needs compared to infant formula.  Breast milk improves your baby's brain development. For you  Breastfeeding helps to create a very special bond between you and your baby.  Breastfeeding is convenient. Breast milk costs nothing and is always available at the correct temperature.  Breastfeeding  helps to burn calories. It helps you to lose the weight that you gained during pregnancy.  Breastfeeding makes your uterus return faster to its size before pregnancy. It also slows bleeding (lochia) after you give birth.  Breastfeeding helps to lower your risk of developing type 2 diabetes, osteoporosis, rheumatoid arthritis, cardiovascular disease, and breast, ovarian, uterine, and endometrial cancer later in life. Breastfeeding basics Starting breastfeeding  Find a comfortable place to sit or lie down, with your neck and back well-supported.  Place a pillow or a rolled-up blanket under your baby to bring him or her to the level of your breast (if you are seated). Nursing pillows are specially designed to help support your arms and your baby while you breastfeed.  Make sure that your baby's tummy (abdomen) is facing your abdomen.  Gently massage your breast. With your fingertips, massage from the outer edges of your  breast inward toward the nipple. This encourages milk flow. If your milk flows slowly, you may need to continue this action during the feeding.  Support your breast with 4 fingers underneath and your thumb above your nipple (make the letter "C" with your hand). Make sure your fingers are well away from your nipple and your baby's mouth.  Stroke your baby's lips gently with your finger or nipple.  When your baby's mouth is open wide enough, quickly bring your baby to your breast, placing your entire nipple and as much of the areola as possible into your baby's mouth. The areola is the colored area around your nipple. ? More areola should be visible above your baby's upper lip than below the lower lip. ? Your baby's lips should be opened and extended outward (flanged) to ensure an adequate, comfortable latch. ? Your baby's tongue should be between his or her lower gum and your breast.  Make sure that your baby's mouth is correctly positioned around your nipple (latched). Your baby's lips should create a seal on your breast and be turned out (everted).  It is common for your baby to suck about 2-3 minutes in order to start the flow of breast milk. Latching Teaching your baby how to latch onto your breast properly is very important. An improper latch can cause nipple pain, decreased milk supply, and poor weight gain in your baby. Also, if your baby is not latched onto your nipple properly, he or she may swallow some air during feeding. This can make your baby fussy. Burping your baby when you switch breasts during the feeding can help to get rid of the air. However, teaching your baby to latch on properly is still the best way to prevent fussiness from swallowing air while breastfeeding. Signs that your baby has successfully latched onto your nipple  Silent tugging or silent sucking, without causing you pain. Infant's lips should be extended outward (flanged).  Swallowing heard between every 3-4  sucks once your milk has started to flow (after your let-down milk reflex occurs).  Muscle movement above and in front of his or her ears while sucking. Signs that your baby has not successfully latched onto your nipple  Sucking sounds or smacking sounds from your baby while breastfeeding.  Nipple pain. If you think your baby has not latched on correctly, slip your finger into the corner of your baby's mouth to break the suction and place it between your baby's gums. Attempt to start breastfeeding again. Signs of successful breastfeeding Signs from your baby  Your baby will gradually decrease the number of sucks  or will completely stop sucking.  Your baby will fall asleep.  Your baby's body will relax.  Your baby will retain a small amount of milk in his or her mouth.  Your baby will let go of your breast by himself or herself. Signs from you  Breasts that have increased in firmness, weight, and size 1-3 hours after feeding.  Breasts that are softer immediately after breastfeeding.  Increased milk volume, as well as a change in milk consistency and color by the fifth day of breastfeeding.  Nipples that are not sore, cracked, or bleeding. Signs that your baby is getting enough milk  Wetting at least 1-2 diapers during the first 24 hours after birth.  Wetting at least 5-6 diapers every 24 hours for the first week after birth. The urine should be clear or pale yellow by the age of 5 days.  Wetting 6-8 diapers every 24 hours as your baby continues to grow and develop.  At least 3 stools in a 24-hour period by the age of 5 days. The stool should be soft and yellow.  At least 3 stools in a 24-hour period by the age of 7 days. The stool should be seedy and yellow.  No loss of weight greater than 10% of birth weight during the first 3 days of life.  Average weight gain of 4-7 oz (113-198 g) per week after the age of 4 days.  Consistent daily weight gain by the age of 5 days,  without weight loss after the age of 2 weeks. After a feeding, your baby may spit up a small amount of milk. This is normal. Breastfeeding frequency and duration Frequent feeding will help you make more milk and can prevent sore nipples and extremely full breasts (breast engorgement). Breastfeed when you feel the need to reduce the fullness of your breasts or when your baby shows signs of hunger. This is called "breastfeeding on demand." Signs that your baby is hungry include:  Increased alertness, activity, or restlessness.  Movement of the head from side to side.  Opening of the mouth when the corner of the mouth or cheek is stroked (rooting).  Increased sucking sounds, smacking lips, cooing, sighing, or squeaking.  Hand-to-mouth movements and sucking on fingers or hands.  Fussing or crying. Avoid introducing a pacifier to your baby in the first 4-6 weeks after your baby is born. After this time, you may choose to use a pacifier. Research has shown that pacifier use during the first year of a baby's life decreases the risk of sudden infant death syndrome (SIDS). Allow your baby to feed on each breast as long as he or she wants. When your baby unlatches or falls asleep while feeding from the first breast, offer the second breast. Because newborns are often sleepy in the first few weeks of life, you may need to awaken your baby to get him or her to feed. Breastfeeding times will vary from baby to baby. However, the following rules can serve as a guide to help you make sure that your baby is properly fed:  Newborns (babies 61 weeks of age or younger) may breastfeed every 1-3 hours.  Newborns should not go without breastfeeding for longer than 3 hours during the day or 5 hours during the night.  You should breastfeed your baby a minimum of 8 times in a 24-hour period. Breast milk pumping     Pumping and storing breast milk allows you to make sure that your baby is exclusively fed  your  breast milk, even at times when you are unable to breastfeed. This is especially important if you go back to work while you are still breastfeeding, or if you are not able to be present during feedings. Your lactation consultant can help you find a method of pumping that works best for you and give you guidelines about how long it is safe to store breast milk. Caring for your breasts while you breastfeed Nipples can become dry, cracked, and sore while breastfeeding. The following recommendations can help keep your breasts moisturized and healthy:  Avoid using soap on your nipples.  Wear a supportive bra designed especially for nursing. Avoid wearing underwire-style bras or extremely tight bras (sports bras).  Air-dry your nipples for 3-4 minutes after each feeding.  Use only cotton bra pads to absorb leaked breast milk. Leaking of breast milk between feedings is normal.  Use lanolin on your nipples after breastfeeding. Lanolin helps to maintain your skin's normal moisture barrier. Pure lanolin is not harmful (not toxic) to your baby. You may also hand express a few drops of breast milk and gently massage that milk into your nipples and allow the milk to air-dry. In the first few weeks after giving birth, some women experience breast engorgement. Engorgement can make your breasts feel heavy, warm, and tender to the touch. Engorgement peaks within 3-5 days after you give birth. The following recommendations can help to ease engorgement:  Completely empty your breasts while breastfeeding or pumping. You may want to start by applying warm, moist heat (in the shower or with warm, water-soaked hand towels) just before feeding or pumping. This increases circulation and helps the milk flow. If your baby does not completely empty your breasts while breastfeeding, pump any extra milk after he or she is finished.  Apply ice packs to your breasts immediately after breastfeeding or pumping, unless this is too  uncomfortable for you. To do this: ? Put ice in a plastic bag. ? Place a towel between your skin and the bag. ? Leave the ice on for 20 minutes, 2-3 times a day.  Make sure that your baby is latched on and positioned properly while breastfeeding. If engorgement persists after 48 hours of following these recommendations, contact your health care provider or a Science writer. Overall health care recommendations while breastfeeding  Eat 3 healthy meals and 3 snacks every day. Well-nourished mothers who are breastfeeding need an additional 450-500 calories a day. You can meet this requirement by increasing the amount of a balanced diet that you eat.  Drink enough water to keep your urine pale yellow or clear.  Rest often, relax, and continue to take your prenatal vitamins to prevent fatigue, stress, and low vitamin and mineral levels in your body (nutrient deficiencies).  Do not use any products that contain nicotine or tobacco, such as cigarettes and e-cigarettes. Your baby may be harmed by chemicals from cigarettes that pass into breast milk and exposure to secondhand smoke. If you need help quitting, ask your health care provider.  Avoid alcohol.  Do not use illegal drugs or marijuana.  Talk with your health care provider before taking any medicines. These include over-the-counter and prescription medicines as well as vitamins and herbal supplements. Some medicines that may be harmful to your baby can pass through breast milk.  It is possible to become pregnant while breastfeeding. If birth control is desired, ask your health care provider about options that will be safe while breastfeeding your baby. Where to  find more information: [employer]: www.llli.org Contact a health care provider if:  You feel like you want to stop breastfeeding or have become frustrated with breastfeeding.  Your nipples are cracked or bleeding.  Your breasts are red, tender, or  warm.  You have: ? Painful breasts or nipples. ? A swollen area on either breast. ? A fever or chills. ? Nausea or vomiting. ? Drainage other than breast milk from your nipples.  Your breasts do not become full before feedings by the fifth day after you give birth.  You feel sad and depressed.  Your baby is: ? Too sleepy to eat well. ? Having trouble sleeping. ? More than 42 week old and wetting fewer than 6 diapers in a 24-hour period. ? Not gaining weight by 49 days of age.  Your baby has fewer than 3 stools in a 24-hour period.  Your baby's skin or the white parts of his or her eyes become yellow. Get help right away if:  Your baby is overly tired (lethargic) and does not want to wake up and feed.  Your baby develops an unexplained fever. Summary  Breastfeeding offers many health benefits for infant and mothers.  Try to breastfeed your infant when he or she shows early signs of hunger.  Gently tickle or stroke your baby's lips with your finger or nipple to allow the baby to open his or her mouth. Bring the baby to your breast. Make sure that much of the areola is in your baby's mouth. Offer one side and burp the baby before you offer the other side.  Talk with your health care provider or lactation consultant if you have questions or you face problems as you breastfeed. This information is not intended to replace advice given to you by your health care provider. Make sure you discuss any questions you have with your health care provider. Document Revised: 11/09/2017 Document Reviewed: 09/16/2016 Elsevier Patient Education  [place].

## 2019-11-28 NOTE — Progress Notes (Signed)
   Subjective:    Julie King is a G2P1001 [redacted]w[redacted]d being seen today for her first obstetrical visit.  Patient is transferring care from [facility]. Some record in Care everywhere. Her obstetrical history is significant for obesity and CHTN and previous c-section. Patient with history of A-fib not currently on medication Patient does intend to breast feed. Pregnancy history fully reviewed.  Patient reports no complaints.  Vitals:   11/28/19 1021  BP: 120/73  Pulse: 98  Weight: 297 lb 1.6 oz (134.8 kg)    HISTORY: OB History  Gravida Para Term Preterm AB Living  2 1 1     1   SAB TAB Ectopic Multiple Live Births          1    # Outcome Date GA Lbr Len/2nd Weight Sex Delivery Anes PTL Lv  2 Current           1 Term 2013    F CS-LTranv   LIV   Past Medical History:  Diagnosis Date  . Atrial fibrillation ([place])   . Headache 07/25/2017  . Hypertension   . Irritable bowel syndrome   . Morbidly obese ([place]) 07/25/2017  . QT prolongation   . T wave inversion in EKG    Past Surgical History:  Procedure Laterality Date  . CESAREAN SECTION    . CHOLECYSTECTOMY    . HERNIA REPAIR    . TONSILLECTOMY     Family History  Problem Relation Age of Onset  . Hypertension Other   . Diabetes Other   . Crohn's disease Mother   . Hypertension Mother   . Thyroid disease Father   . Diabetes Father   . Breast cancer Maternal Aunt        late 76 early 19s  . Breast cancer Maternal Grandmother        late 21s early 36     Exam    Uterus:  Fundal Height: 35 cm  Pelvic Exam:       Assessment:    Pregnancy: G2P1001 Patient Active Problem List   Diagnosis Date Noted  . Supervision of other normal pregnancy, antepartum 11/28/2019  . Anemia in pregnancy, third trimester 11/28/2019  . Previous cesarean delivery affecting pregnancy 11/28/2019  . Chronic hypertension affecting pregnancy 11/28/2019  . History of atrial fibrillation 11/28/2019  . Maternal obesity affecting  pregnancy, antepartum 11/28/2019  . No leakage of amniotic fluid into vagina 10/08/2019  . Vaginal discharge 10/08/2019  . Headache 07/25/2017  . Morbidly obese ([place]) 07/25/2017        Plan:     Initial labs and third trimester labs reviewed. Prenatal vitamins. Problem list reviewed and updated.  Ultrasound discussed; fetal survey: results reviewed. Follow up growth ordered Continue ASA Continue monitoring BP- current not on any antihypertensive Patient with previous elevative c-section due to suspected macrosomia. Patient desires TOLAC- consent signed Patient is in a same-sex relationship and declines contraception Patient scheduled to meet cardiologist next week due to history of A-fib  Follow up in 2 weeks. 50% of 30 min visit spent on counseling and coordination of care.     Aloura Matsuoka 11/28/2019

## 2019-11-28 NOTE — Progress Notes (Signed)
NOB transferred from [facility] in [place]  This is a surrogate pregnancy for a friend  Pt c/o BH's and watery vaginal discharge.

## 2019-12-02 LAB — CERVICOVAGINAL ANCILLARY ONLY
Bacterial Vaginitis (gardnerella): NEGATIVE
Candida Glabrata: NEGATIVE
Candida Vaginitis: NEGATIVE
Comment: NEGATIVE
Comment: NEGATIVE
Comment: NEGATIVE

## 2019-12-12 ENCOUNTER — Encounter: Payer: Self-pay | Admitting: Obstetrics and Gynecology

## 2019-12-12 ENCOUNTER — Telehealth (INDEPENDENT_AMBULATORY_CARE_PROVIDER_SITE_OTHER): Payer: BC Managed Care – PPO | Admitting: Obstetrics and Gynecology

## 2019-12-12 DIAGNOSIS — Z3480 Encounter for supervision of other normal pregnancy, unspecified trimester: Secondary | ICD-10-CM

## 2019-12-12 DIAGNOSIS — D649 Anemia, unspecified: Secondary | ICD-10-CM

## 2019-12-12 DIAGNOSIS — Z3A29 29 weeks gestation of pregnancy: Secondary | ICD-10-CM

## 2019-12-12 DIAGNOSIS — O99213 Obesity complicating pregnancy, third trimester: Secondary | ICD-10-CM

## 2019-12-12 DIAGNOSIS — E669 Obesity, unspecified: Secondary | ICD-10-CM

## 2019-12-12 DIAGNOSIS — O10913 Unspecified pre-existing hypertension complicating pregnancy, third trimester: Secondary | ICD-10-CM

## 2019-12-12 DIAGNOSIS — O99210 Obesity complicating pregnancy, unspecified trimester: Secondary | ICD-10-CM

## 2019-12-12 DIAGNOSIS — O10919 Unspecified pre-existing hypertension complicating pregnancy, unspecified trimester: Secondary | ICD-10-CM

## 2019-12-12 DIAGNOSIS — O99013 Anemia complicating pregnancy, third trimester: Secondary | ICD-10-CM

## 2019-12-12 DIAGNOSIS — Z8679 Personal history of other diseases of the circulatory system: Secondary | ICD-10-CM

## 2019-12-12 DIAGNOSIS — O34219 Maternal care for unspecified type scar from previous cesarean delivery: Secondary | ICD-10-CM

## 2019-12-12 NOTE — Progress Notes (Signed)
   OBSTETRICS PRENATAL VIRTUAL VISIT ENCOUNTER NOTE  Provider location: [facility] for [place] at [place]   I connected with Julie King on 12/12/19 at  9:15 AM EDT by MyChart Video Encounter at home and verified that I am speaking with the correct person using two identifiers.   I discussed the limitations, risks, security and privacy concerns of performing an evaluation and management service virtually and the availability of in person appointments. I also discussed with the patient that there may be a patient responsible charge related to this service. The patient expressed understanding and agreed to proceed. Subjective:  Julie King is a 29 y.o. G2P1001 at [redacted]w[redacted]d being seen today for ongoing prenatal care.  She is currently monitored for the following issues for this high-risk pregnancy and has Headache; Morbidly obese ([place]); No leakage of amniotic fluid into vagina; Vaginal discharge; Supervision of other normal pregnancy, antepartum; Anemia in pregnancy, third trimester; Previous cesarean delivery affecting pregnancy; Chronic hypertension affecting pregnancy; History of atrial fibrillation; and Maternal obesity affecting pregnancy, antepartum on their problem list.  Patient reports no complaints.  Contractions: Irritability. Vag. Bleeding: None.  Movement: Present. Denies any leaking of fluid.   The following portions of the patient's history were reviewed and updated as appropriate: allergies, current medications, past family history, past medical history, past social history, past surgical history and problem list.   Objective:  There were no vitals filed for this visit.  Fetal Status:     Movement: Present     General:  Alert, oriented and cooperative. Patient is in no acute distress.  Respiratory: Normal respiratory effort, no problems with respiration noted  Mental Status: Normal mood and affect. Normal behavior. Normal judgment and thought content.  Rest of physical  exam deferred due to type of encounter  Imaging: No results found.  Assessment and Plan:  Pregnancy: G2P1001 at [redacted]w[redacted]d 1. Supervision of other normal pregnancy, antepartum Patient is doing well without complaints  2. Chronic hypertension affecting pregnancy Continue ASA Patient states she has 2 BP cuff at home which are inacurate. Will have patient come in for cuff calibration at next visit  3. Previous cesarean delivery affecting pregnancy Desires TOLAC  4. Maternal obesity affecting pregnancy, antepartum   5. Anemia in pregnancy, third trimester Continue iron supplements  6. History of atrial fibrillation Patient seen by cardiologist. Awaiting holter monitor results  Preterm labor symptoms and general obstetric precautions including but not limited to vaginal bleeding, contractions, leaking of fluid and fetal movement were reviewed in detail with the patient. I discussed the assessment and treatment plan with the patient. The patient was provided an opportunity to ask questions and all were answered. The patient agreed with the plan and demonstrated an understanding of the instructions. The patient was advised to call back or seek an in-person office evaluation/go to [facility] at [facility] for any urgent or concerning symptoms. Please refer to After Visit Summary for other counseling recommendations.   I provided 11 minutes of face-to-face time during this encounter.  No follow-ups on file.  Future Appointments  Date Time Provider [place]  12/13/2019 10:30 AM WH-MFC [place] 5 WH-MFCUS MFC-US    Lauretta Sallas, MD Center for [employer], [place]

## 2019-12-12 NOTE — Progress Notes (Signed)
Virtual ROB   CC: None

## 2019-12-13 ENCOUNTER — Other Ambulatory Visit: Payer: Self-pay

## 2019-12-13 ENCOUNTER — Other Ambulatory Visit (HOSPITAL_COMMUNITY): Payer: Self-pay | Admitting: *Deleted

## 2019-12-13 ENCOUNTER — Other Ambulatory Visit: Payer: Self-pay | Admitting: Obstetrics and Gynecology

## 2019-12-13 ENCOUNTER — Ambulatory Visit (HOSPITAL_COMMUNITY)
Admission: RE | Admit: 2019-12-13 | Discharge: 2019-12-13 | Disposition: A | Discharge status: Home / Self Care | Payer: BC Managed Care – PPO | Source: Ambulatory Visit | Attending: Obstetrics and Gynecology | Admitting: Obstetrics and Gynecology

## 2019-12-13 DIAGNOSIS — E669 Obesity, unspecified: Secondary | ICD-10-CM

## 2019-12-13 DIAGNOSIS — O10012 Pre-existing essential hypertension complicating pregnancy, second trimester: Secondary | ICD-10-CM | POA: Diagnosis not present

## 2019-12-13 DIAGNOSIS — Z363 Encounter for antenatal screening for malformations: Secondary | ICD-10-CM

## 2019-12-13 DIAGNOSIS — Z3480 Encounter for supervision of other normal pregnancy, unspecified trimester: Secondary | ICD-10-CM

## 2019-12-13 DIAGNOSIS — O99212 Obesity complicating pregnancy, second trimester: Secondary | ICD-10-CM

## 2019-12-13 DIAGNOSIS — O10919 Unspecified pre-existing hypertension complicating pregnancy, unspecified trimester: Secondary | ICD-10-CM

## 2019-12-13 DIAGNOSIS — Z3A29 29 weeks gestation of pregnancy: Secondary | ICD-10-CM

## 2019-12-16 ENCOUNTER — Telehealth: Payer: Self-pay

## 2019-12-16 NOTE — Telephone Encounter (Signed)
Pt called c/o discomfort possible contractions /BH contractions Pt notes since 10:30am 15-20 mins apart not consistent No VB or leaking Pt made aware of signs of labor and comfort measures and to monitor sx's and if no relief to contact the office.  Pt agreeable and voiced understanding I also will send pt fetal kick count sheet in mychart.

## 2019-12-18 ENCOUNTER — Telehealth: Payer: Self-pay

## 2019-12-18 ENCOUNTER — Other Ambulatory Visit: Payer: Self-pay

## 2019-12-18 DIAGNOSIS — R110 Nausea: Secondary | ICD-10-CM

## 2019-12-18 MED ORDER — PROMETHAZINE HCL 25 MG PO TABS: 25.0000 mg | ORAL_TABLET | Freq: Four times a day (QID) | ORAL | 0 refills | Status: DC | PRN

## 2019-12-18 NOTE — Telephone Encounter (Signed)
Return call to pt regarding Nausea and fatigue/ shaky. Pt reports sx's past few days now. Pt made aware of nausea comfort measures  Made pt aware HGB was a little low and she should take her iron as directed  Pt notes she has not been consistent with iron tablets.  Pt noted feeling a little better after a meal.  Pt does not complain of cold sx's or COVID-19 sx's Pt made aware to eat small frequent meals, stop PNV until she is feeling better Pt asked if she wanted Rx to help with nausea. Pt would like Rx Pt made aware to monitor sx's and if no relief she may report to MAU.

## 2019-12-26 ENCOUNTER — Ambulatory Visit (INDEPENDENT_AMBULATORY_CARE_PROVIDER_SITE_OTHER): Payer: BC Managed Care – PPO | Admitting: Obstetrics and Gynecology

## 2019-12-26 ENCOUNTER — Other Ambulatory Visit: Payer: Self-pay

## 2019-12-26 ENCOUNTER — Encounter: Payer: Self-pay | Admitting: Obstetrics and Gynecology

## 2019-12-26 VITALS — BP 113/76 | HR 80 | Wt 297.0 lb

## 2019-12-26 DIAGNOSIS — Z3A31 31 weeks gestation of pregnancy: Secondary | ICD-10-CM

## 2019-12-26 DIAGNOSIS — Z23 Encounter for immunization: Secondary | ICD-10-CM

## 2019-12-26 DIAGNOSIS — O99210 Obesity complicating pregnancy, unspecified trimester: Secondary | ICD-10-CM

## 2019-12-26 DIAGNOSIS — O99013 Anemia complicating pregnancy, third trimester: Secondary | ICD-10-CM

## 2019-12-26 DIAGNOSIS — Z3480 Encounter for supervision of other normal pregnancy, unspecified trimester: Secondary | ICD-10-CM

## 2019-12-26 DIAGNOSIS — O99213 Obesity complicating pregnancy, third trimester: Secondary | ICD-10-CM

## 2019-12-26 DIAGNOSIS — O10913 Unspecified pre-existing hypertension complicating pregnancy, third trimester: Secondary | ICD-10-CM

## 2019-12-26 DIAGNOSIS — O34219 Maternal care for unspecified type scar from previous cesarean delivery: Secondary | ICD-10-CM

## 2019-12-26 DIAGNOSIS — O10919 Unspecified pre-existing hypertension complicating pregnancy, unspecified trimester: Secondary | ICD-10-CM

## 2019-12-26 NOTE — Progress Notes (Signed)
Pt presents for ROB c/o expelling mucus "glob."

## 2019-12-26 NOTE — Progress Notes (Signed)
   PRENATAL VISIT NOTE  Subjective:  Julie King is a 29 y.o. G2P1001 at [redacted]w[redacted]d being seen today for ongoing prenatal care.  She is currently monitored for the following issues for this high-risk pregnancy and has Headache; Morbidly obese ([place]); No leakage of amniotic fluid into vagina; Vaginal discharge; Supervision of other normal pregnancy, antepartum; Anemia in pregnancy, third trimester; Previous cesarean delivery affecting pregnancy; Chronic hypertension affecting pregnancy; History of atrial fibrillation; and Maternal obesity affecting pregnancy, antepartum on their problem list.  Patient reports no complaints.  Contractions: Irregular. Vag. Bleeding: None.  Movement: Present. Denies leaking of fluid.   The following portions of the patient's history were reviewed and updated as appropriate: allergies, current medications, past family history, past medical history, past social history, past surgical history and problem list.   Objective:   Vitals:   12/26/19 1006  BP: 113/76  Pulse: 80  Weight: 297 lb (134.7 kg)    Fetal Status: Fetal Heart Rate (bpm): 137 Fundal Height: 35 cm Movement: Present     General:  Alert, oriented and cooperative. Patient is in no acute distress.  Skin: Skin is warm and dry. No rash noted.   Cardiovascular: Normal heart rate noted  Respiratory: Normal respiratory effort, no problems with respiration noted  Abdomen: Soft, gravid, appropriate for gestational age.  Pain/Pressure: Present     Pelvic: Cervical exam deferred        Extremities: Normal range of motion.  Edema: None  Mental Status: Normal mood and affect. Normal behavior. Normal judgment and thought content.   Assessment and Plan:  Pregnancy: G2P1001 at [redacted]w[redacted]d 1. Supervision of other normal pregnancy, antepartum Patient is doing well without complaints Tdap today  2. Chronic hypertension affecting pregnancy Continue ASA FOllow up growth ultrasound 5/14  3. Anemia in pregnancy, third  trimester Continue iron supplement  4. Previous cesarean delivery affecting pregnancy Desires TOLAC  5. Maternal obesity affecting pregnancy, antepartum   Preterm labor symptoms and general obstetric precautions including but not limited to vaginal bleeding, contractions, leaking of fluid and fetal movement were reviewed in detail with the patient. Please refer to After Visit Summary for other counseling recommendations.   No follow-ups on file.  Future Appointments  Date Time Provider [place]  01/10/2020 12:45 PM [place] MFC-US  01/10/2020 12:45 PM [place] 5 WH-MFCUS MFC-US    Mora Bellman, MD

## 2019-12-26 NOTE — Patient Instructions (Signed)
AREA PEDIATRIC/FAMILY PRACTICE PHYSICIANS  [place] ([address]) . [facility] o Chambliss, MD; Eniola, MD; Hale, MD; Hensel, MD; McDiarmid, MD; McIntyer, MD; Neal, MD; Walden, MD o [address] [place], [place] [address] o [phone] o Mon-Fri 8:30-12:30, 1:30-5:00 o Providers come to see babies at [facility] o Accepting Medicaid . [facility] o Limited providers who accept newborns: Koirala, MD; Morrow, MD; Wolters, MD o [address], [place], [place] [address] o [phone] o Mon-Fri 8:00-5:30 o Babies seen by providers at [facility] o Does NOT accept Medicaid o Please call early in hospitalization for appointment (limited availability)  . [facility] o Mulberry, MD o [address] [place], [place] [address] o [phone] o Mon, Tue, Thur, Fri 8:30-5:00, Wed 10:00-7:00 (closed 1-2pm) o Babies seen by [facility] providers o Accepting Medicaid . [facility] o Rubin, MD o [address], [place], [place] [address] o [phone] o Mon-Fri 8:30-5:00, Sat 8:30-12:00 o Provider comes to see babies at [facility] o Accepting Medicaid o Must have been referred from current patients or contacted office prior to delivery . [facility] ([facility]) o Brown, MD; Chandler, MD; Ettefagh, MD; Grant, MD; Lester, MD; McCormick, MD; McQueen, MD; Prose, MD; Simha, MD; Stanley, MD; Stryffeler, NP; Tebben, NP o [address], [place], [place] [address] o [phone] o Mon, Tue, Thur, Fri 8:30-5:30, Wed 9:30-5:30, Sat 8:30-12:30 o Babies seen by [facility] providers o Accepting Medicaid o Only accepting infants of first-time parents or siblings of current patients o Hospital discharge coordinator will make follow-up appointment . Jack Amos o [address],  [place], [place]  [address] o [phone]   Fax - [phone] . [facility] o [address], [place], [place]  [address] o Phone - [phone]   Fax - [phone] . Shilpa Gosrani o [address], [place], [place]  [address] o [phone]  [place] ([address]) . [facility] o Bates, MD; Brassfield, MD; Cooper, Cox, MD; MD; Raczynski, MD; Dovico, MD; Ettefaugh, MD; Little, MD; Lowe, MD; Keiffer, MD; Melvin, MD; Sumner, MD; Williams, MD o [address], [place], [place] [address] o [phone] o Mon-Fri 8:30-5:00 (extended evenings Mon-Thur as needed), Sat-Sun 10:00-1:00 o Providers come to see babies at [facility] o Accepting Medicaid for families of first-time babies and families with all children in the household age 3 and under. Must register with office prior to making appointment (M-F only). . [facility] o Henson, NP; Knapp, MD; Lalonde, MD; Tysinger, PA o [address] [place], [place] [address] o [phone] o Mon-Fri 8:00-5:00 o Babies seen by providers at [facility] o Does NOT accept Medicaid/Commercial Insurance Only . [facility] ([facility]  o Artis, MD; Barnes, MD; Bratton, MD; Coccaro, MD; [place] Gardner, MD; Kramer, MD; [name], MD; Netherton, MD; Poleto, MD; Skinner, MD o [address] [place], [place] [address] o [phone] o Mon-Fri 8:30-5:30, Sat (Oct.-Mar.) 9:00-1:00 o Babies seen by providers at [facility] o Accepting Medicaid  [place] ([address]) . [facility] o Reid, MD; Warner, MD o [address], , [place] [address] o [phone] o Mon-Fri 8:30-5:00, Sat 8:30-12:00 o Providers come to see babies at [facility] o Does NOT accept Medicaid . [facility] o Becker, PA; Hagler, MD; Scifres, PA; Sun, MD; Swayne, MD o [address],  [place], [place] [address] o [phone] o Mon-Fri 8:00-5:00 o Babies seen by providers at [facility] o Does NOT accept Medicaid o Only accepting babies of parents who are patients o Please call early in hospitalization for appointment (limited availability) . [facility] o Clark, MD; Frye, MD; Kelleher, MD; Mack, NP; Miller, MD; O'Keller, MD; Patterson, NP; Pudlo, MD; Puzio, MD; Thomas, MD; Tucker, MD; Twiselton, MD o [address], [place], [place] [address] o [phone] o Mon-Fri 8:00-5:00, Sat 9:00-12:00 o Providers come to see babies at [facility] o Does NOT accept Medicaid  [place] ([address]) . [facility] o Limited providers accepting new patients: Brake, NP; Wharton, PA o [address], [place], [place] [address] o [phone] o Mon-Fri 8:00-5:00 o Babies seen by providers at [facility] o Does NOT accept Medicaid o Only accepting babies of parents who are patients o Please call early in hospitalization for appointment (limited availability) . [facility] o Gay, MD; Quinlan, MD o [address] [place], [place] [address] o [phone] (press 1 to schedule appointment) o Mon-Fri 8:00-5:00 o Providers come to see babies at [facility] o Does NOT accept Medicaid . [facility] o Mazer, MD o [address] [place], [place] [address] o [phone] o Mon-Fri 8:30-5:00 (lunch 12:30-1:00), extended hours by appointment only Wed 5:00-6:30 o Babies seen by [facility] providers o Accepting Medicaid . [facility] o Banks, MD; Jordan, MD; Koberlein, MD o [address], [place], [place] [address] o [phone] o Mon-Fri 8:00-5:00 o Babies seen by [facility] providers o Does NOT accept Medicaid . [facility] o Parker, MD; Hunter, MD; Wallace, DO o [address] [place], [place]  [address] o [phone] o Mon-Fri 8:00-5:00 o Babies seen by [facility] providers o Does NOT accept Medicaid . [facility] o Brandon, PA; Brecken, PA; Christy, NP; Dees, MD; DeClaire, MD; DeWeese, MD; Hansen, NP; Mills, NP; Parrish, NP; Smoot, NP; Summer, MD; Vapne, MD o [address] [place], [place] [address] o [phone] o Mon-Fri 8:30-5:00, Sat 10:00-1:00 o Providers come to see babies at [facility] o Does NOT accept Medicaid o Free prenatal information session Tuesdays at 4:45pm . [facility] o Bouska, MD; Gordon, PA; Jeffery, PA; Weber, PA o [address] [place] [address] o [phone] o Mon-Fri 7:30-5:30 o Babies seen by [facility] providers . [facility] o [address], [place], [place]  [address] o [phone]   Fax - [phone]  [place] ([address]) . [facility] o [place], [place] o [address] [place], [place] [address] o [phone] o Mon-Thur 8:00-6:00 o Providers come to see babies at [facility] o Accepting Medicaid . [facility] o Anderson, NP; Badger, MD; Beal, PA; Spencer, PA o [address] [place], [place] [address] o [phone] o Mon-Thur 7:30-7:30, Fri 7:30-4:30 o Babies seen by [facility] providers o Accepting Medicaid . [facility] o Agbuya, MD; Klett, NP; Romgoolam, MD o [address], [place], [place] [address] o [phone] o Mon-Fri 8:30-5:00, Sat 8:30-12:00 o Providers come to see babies at [facility] o Accepting Medicaid o Must have "Meet & Greet" appointment at office prior to delivery . [facility] o McCord,   MD; Wallace, MD; Wood, MD o [address], [place], [place] [address] o [phone] o Mon-Wed 8:00-6:00, Thur-Fri 8:00-5:00, Sat 9:00-12:00 o Providers come to  see babies at [facility] o Does NOT accept Medicaid o Only accepting siblings of current patients . [facility]  o [address], [place], [place]  [address] o [phone]   Fax - [phone] . [facility] o [address], [place], [place]  [address] o [phone]   Fax - [phone]  [place] ([address] & [address]) . [facility] o Cirigliano, DO; Matthews, DO o [address] [place], [place] [address] o [phone] o Mon-Fri 7:00-5:00 o Babies seen by [facility] providers o Does NOT accept Medicaid . [facility] o Briscoe, MD; Howley, PA; [place], PA o [address], [place], [place] [address] o [phone] o Mon-Fri 8:00-5:00 o Babies seen by [facility] providers o Accepting Medicaid . [facility] o Boyd, MD; Church, PA; Jones, NP; Osborn, PA o [address], [place], [place] [address] o [phone] o Mon-Fri 8:00-5:00 o Babies seen by providers at [facility] o Accepting Medicaid  [place] ([address]) . [facility] [place] o Wendling, DO o [address] [place], [place] [address] o [phone] o Mon-Fri 8:00-5:00 o Babies seen by [facility] providers o Does NOT accept Medicaid o Limited availability, please call early in hospitalization to schedule follow-up . [facility] o Calderon, PA; Cummings, MD; Dillard, MD; Martin, PA; Olson, MD; VanDeven, PA o [address], [place], [place] [address] o [phone] o Mon-Fri 8:30-5:00, Sat 9:00-12:00 o Babies seen by providers at [facility] o Accepting Medicaid o Please register online then schedule online or call office o [email or web address] . [facility] o Hunter, NP; Kumar, MD; Martin Rogers, PA o [address], [place], [place] [address] o [phone] o Mon-Fri 8:00-5:00 o Babies seen by providers at [facility] o Accepting Medicaid . [facility] o [place], MD; Kristi Fleenor, NP; West, MD o [address], [place], [place] [address] o [phone] o Mon-Fri 8:00-5:30, Sat&Sun by appointment (phones open at 8:30) o Babies seen by [facility] providers o Accepting Medicaid o Must be a first-time baby or sibling of current patient . [facility] [place]  o [address], [place], [place]  [address] o [phone]   Fax - [phone]  [place] ([address]) . [facility] o Brown, PA; Cowen, PA; Rice, MD; Helton, PA; Spry, MD o [address] [place], [place] [address] o [phone] o Mon-Thur 8:00-7:00, Fri 8:00-5:00, Sat 8:00-12:00, Sun 9:00-12:00 o Babies seen by [facility] providers o Accepting Medicaid . [facility] o Coe-Goins, MD; [name], MD; Pierre-Louis, MD o [address] [place], [place], [place] [address] o [phone] o Mon-Thur 8:00-5:00 o Babies seen by providers at [facility] o Accepting Medicaid . [facility] o Bratton, MD; Coe-Goins, MD; Hayes, MD; Lewis, MD; List, MD; Lott, MD; [name], MD; Moran, MD; O'Neal, MD; Pierre-Louis, MD; Pitonzo, MD; Scholer, MD; Spangle, MD o [address] [place],    [address] o [phone] o Mon-Fri 8:00-5:30, Sat (Oct.-Mar.) 9:00-1:00 o Babies seen by providers at [facility] o Accepting Medicaid o Must fill out new patient packet, available online at [email or web address] . [facility] ([facility] o Friddle, NP; Harris, NP; Kelly, NP; Logan, MD;  Melvin, PA; Poth, MD; Ramadoss, MD; Stanton, NP o [address], [place], [place] [address] o [phone] o Mon-Thur 8:00-5:30, Fri 8:00-5:00 o Babies seen by providers at [facility] o Accepting Medicaid  [place] ([address]) . [facility] o Dixon, PA; Idanha, MD; Pickard, MD; Tapia, PA o [address], [place], [place] [address] o [phone] o Mon-Fri 8:00-5:00 o Babies seen by providers at [facility] o Accepting Medicaid   [place] ([address]) . [facility] [place] o Masneri, DO; Meyers, MD; Nelson, PA o [address], [place], [place] [address] o [phone] o Mon-Fri 8:00-5:00 o Babies seen by providers at [facility] o Does NOT accept Medicaid o Limited appointment availability, please call early in hospitalization  . [facility] [place] o Kunedd, DO; McGowen, MD o [address], [place], [place] [address] o [phone] o Mon-Fri 8:00-5:00 o Babies seen by [facility] providers o Does NOT accept Medicaid . [facility] [place] o Cameron, MD; MacDonald, MD; Michaels, PA; Nayak, MD o [address] [place] [address], [place], [place] [address] o [phone] o Mon-Fri 8:00-5:00 o After hours clinic ([address] [place], [place] [address]) [phone] Mon-Fri 5:00-8:00, Sat 12:00-6:00, Sun 10:00-4:00 o Babies seen by [facility] providers o Accepting Medicaid . [facility] [place] o [address], [place], [place]  [address] o [phone]   Fax - [phone]  [place] ([address]) . [facility] [place] [facility] o Andy, MD o [address], [place], [place] [address] o [phone] o Mon-Fri 8:00-5:00 o Babies seen by [facility] providers o Does NOT accept Medicaid . [facility] [place] ([facility] [place]) o Eksir, MD o [address], [place], [place]  [address] o [phone] o Mon-Thur 8:00-7:00, Fri 8:00-5:00, Sat 8:00-12:00 o Babies seen by providers at [facility] o Accepting Medicaid - but does not have vaccinations in office (must be received elsewhere) o Limited availability, please call early in hospitalization  [place] ([address]) . [facility]  o Charlene Flemming, MD o [address], [place] [address] o [phone]  Fax [phone]   

## 2019-12-27 ENCOUNTER — Encounter (HOSPITAL_COMMUNITY): Payer: Self-pay | Admitting: Obstetrics & Gynecology

## 2019-12-27 ENCOUNTER — Other Ambulatory Visit: Payer: Self-pay

## 2019-12-27 ENCOUNTER — Inpatient Hospital Stay (HOSPITAL_COMMUNITY)
Admission: AD | Admit: 2019-12-27 | Discharge: 2019-12-27 | Disposition: A | Discharge status: Home / Self Care | Payer: BC Managed Care – PPO | Source: Ambulatory Visit | Attending: Obstetrics & Gynecology | Admitting: Obstetrics & Gynecology

## 2019-12-27 DIAGNOSIS — Z885 Allergy status to narcotic agent status: Secondary | ICD-10-CM | POA: Diagnosis not present

## 2019-12-27 DIAGNOSIS — R102 Pelvic and perineal pain: Secondary | ICD-10-CM | POA: Diagnosis not present

## 2019-12-27 DIAGNOSIS — O99413 Diseases of the circulatory system complicating pregnancy, third trimester: Secondary | ICD-10-CM | POA: Diagnosis not present

## 2019-12-27 DIAGNOSIS — O99013 Anemia complicating pregnancy, third trimester: Secondary | ICD-10-CM | POA: Insufficient documentation

## 2019-12-27 DIAGNOSIS — Z8249 Family history of ischemic heart disease and other diseases of the circulatory system: Secondary | ICD-10-CM | POA: Diagnosis not present

## 2019-12-27 DIAGNOSIS — Z8349 Family history of other endocrine, nutritional and metabolic diseases: Secondary | ICD-10-CM | POA: Insufficient documentation

## 2019-12-27 DIAGNOSIS — N898 Other specified noninflammatory disorders of vagina: Secondary | ICD-10-CM | POA: Diagnosis not present

## 2019-12-27 DIAGNOSIS — I4891 Unspecified atrial fibrillation: Secondary | ICD-10-CM | POA: Insufficient documentation

## 2019-12-27 DIAGNOSIS — Z91018 Allergy to other foods: Secondary | ICD-10-CM | POA: Insufficient documentation

## 2019-12-27 DIAGNOSIS — O26893 Other specified pregnancy related conditions, third trimester: Secondary | ICD-10-CM | POA: Insufficient documentation

## 2019-12-27 DIAGNOSIS — O99213 Obesity complicating pregnancy, third trimester: Secondary | ICD-10-CM | POA: Insufficient documentation

## 2019-12-27 DIAGNOSIS — Z87891 Personal history of nicotine dependence: Secondary | ICD-10-CM | POA: Insufficient documentation

## 2019-12-27 DIAGNOSIS — Z7982 Long term (current) use of aspirin: Secondary | ICD-10-CM | POA: Insufficient documentation

## 2019-12-27 DIAGNOSIS — O10913 Unspecified pre-existing hypertension complicating pregnancy, third trimester: Secondary | ICD-10-CM | POA: Diagnosis not present

## 2019-12-27 DIAGNOSIS — O10912 Unspecified pre-existing hypertension complicating pregnancy, second trimester: Secondary | ICD-10-CM

## 2019-12-27 DIAGNOSIS — O99210 Obesity complicating pregnancy, unspecified trimester: Secondary | ICD-10-CM

## 2019-12-27 DIAGNOSIS — Z8379 Family history of other diseases of the digestive system: Secondary | ICD-10-CM | POA: Insufficient documentation

## 2019-12-27 DIAGNOSIS — Z3A31 31 weeks gestation of pregnancy: Secondary | ICD-10-CM | POA: Diagnosis not present

## 2019-12-27 DIAGNOSIS — D649 Anemia, unspecified: Secondary | ICD-10-CM | POA: Insufficient documentation

## 2019-12-27 DIAGNOSIS — R109 Unspecified abdominal pain: Secondary | ICD-10-CM | POA: Diagnosis present

## 2019-12-27 DIAGNOSIS — Z833 Family history of diabetes mellitus: Secondary | ICD-10-CM | POA: Insufficient documentation

## 2019-12-27 DIAGNOSIS — O26891 Other specified pregnancy related conditions, first trimester: Secondary | ICD-10-CM

## 2019-12-27 DIAGNOSIS — O26899 Other specified pregnancy related conditions, unspecified trimester: Secondary | ICD-10-CM

## 2019-12-27 DIAGNOSIS — Z0371 Encounter for suspected problem with amniotic cavity and membrane ruled out: Secondary | ICD-10-CM

## 2019-12-27 DIAGNOSIS — O10919 Unspecified pre-existing hypertension complicating pregnancy, unspecified trimester: Secondary | ICD-10-CM

## 2019-12-27 DIAGNOSIS — O34219 Maternal care for unspecified type scar from previous cesarean delivery: Secondary | ICD-10-CM | POA: Diagnosis not present

## 2019-12-27 DIAGNOSIS — Z3480 Encounter for supervision of other normal pregnancy, unspecified trimester: Secondary | ICD-10-CM

## 2019-12-27 DIAGNOSIS — Z8679 Personal history of other diseases of the circulatory system: Secondary | ICD-10-CM

## 2019-12-27 DIAGNOSIS — O163 Unspecified maternal hypertension, third trimester: Secondary | ICD-10-CM

## 2019-12-27 LAB — COMPREHENSIVE METABOLIC PANEL
ALT: 15 U/L (ref 0–44)
AST: 14 U/L — ABNORMAL LOW (ref 15–41)
Albumin: 2.7 g/dL — ABNORMAL LOW (ref 3.5–5.0)
Alkaline Phosphatase: 72 U/L (ref 38–126)
Anion gap: 9 (ref 5–15)
BUN: 6 mg/dL (ref 6–20)
CO2: 21 mmol/L — ABNORMAL LOW (ref 22–32)
Calcium: 8.9 mg/dL (ref 8.9–10.3)
Chloride: 108 mmol/L (ref 98–111)
Creatinine, Ser: 0.51 mg/dL (ref 0.44–1.00)
GFR calc Af Amer: >60 mL/min (ref >60)
GFR calc non Af Amer: >60 mL/min (ref >60)
Glucose, Bld: 79 mg/dL (ref 70–99)
Potassium: 4.6 mmol/L (ref 3.5–5.1)
Sodium: 138 mmol/L (ref 135–145)
Total Bilirubin: 0.2 mg/dL — ABNORMAL LOW (ref 0.3–1.2)
Total Protein: 6.3 g/dL — ABNORMAL LOW (ref 6.5–8.1)

## 2019-12-27 LAB — URINALYSIS, ROUTINE W REFLEX MICROSCOPIC
Bilirubin Urine: NEGATIVE
Glucose, UA: NEGATIVE mg/dL
Hgb urine dipstick: NEGATIVE
Ketones, ur: NEGATIVE mg/dL
Leukocytes,Ua: NEGATIVE
Nitrite: NEGATIVE
Protein, ur: 30 mg/dL — AB
Specific Gravity, Urine: 1.034 — ABNORMAL HIGH (ref 1.005–1.030)
pH: 6 (ref 5.0–8.0)

## 2019-12-27 LAB — CBC
HCT: 31.2 % — ABNORMAL LOW (ref 36.0–46.0)
Hemoglobin: 9.7 g/dL — ABNORMAL LOW (ref 12.0–15.0)
MCH: 20 pg — ABNORMAL LOW (ref 26.0–34.0)
MCHC: 31.1 g/dL (ref 30.0–36.0)
MCV: 64.2 fL — ABNORMAL LOW (ref 80.0–100.0)
Platelets: 243 10*3/uL (ref 150–400)
RBC: 4.86 MIL/uL (ref 3.87–5.11)
RDW: 16.3 % — ABNORMAL HIGH (ref 11.5–15.5)
WBC: 10.2 10*3/uL (ref 4.0–10.5)
nRBC: 0 % (ref 0.0–0.2)

## 2019-12-27 LAB — PROTEIN / CREATININE RATIO, URINE
Creatinine, Urine: 286.31 mg/dL
Protein Creatinine Ratio: 0.18 mg/mg{Cre} — ABNORMAL HIGH (ref 0.00–0.15)
Total Protein, Urine: 52 mg/dL

## 2019-12-27 MED ORDER — LABETALOL HCL 5 MG/ML IV SOLN: 40.0000 mg | INTRAVENOUS | Status: DC | PRN

## 2019-12-27 MED ORDER — LABETALOL HCL 200 MG PO TABS: 200.0000 mg | ORAL_TABLET | Freq: Two times a day (BID) | ORAL | 0 refills | Status: DC

## 2019-12-27 MED ORDER — CYCLOBENZAPRINE HCL 10 MG PO TABS
10.0000 mg | ORAL_TABLET | Freq: Every day | ORAL | 0 refills | Status: DC | PRN
Start: 2019-12-27 — End: 2020-02-03

## 2019-12-27 MED ORDER — LABETALOL HCL 5 MG/ML IV SOLN
20.0000 mg | INTRAVENOUS | Status: DC | PRN
  Administered 2019-12-27: 20 mg via INTRAVENOUS
  Filled 2019-12-27: qty 4

## 2019-12-27 MED ORDER — HYDRALAZINE HCL 20 MG/ML IJ SOLN: 10.0000 mg | INTRAMUSCULAR | Status: DC | PRN

## 2019-12-27 MED ORDER — LABETALOL HCL 5 MG/ML IV SOLN: 80.0000 mg | INTRAVENOUS | Status: DC | PRN

## 2019-12-27 MED ORDER — FERROUS SULFATE 325 (65 FE) MG PO TABS: 325.0000 mg | ORAL_TABLET | Freq: Every day | ORAL | 3 refills | Status: DC

## 2019-12-27 MED ORDER — CYCLOBENZAPRINE HCL 5 MG PO TABS
10.0000 mg | ORAL_TABLET | Freq: Once | ORAL | Status: AC
  Administered 2019-12-27: 10 mg via ORAL
  Filled 2019-12-27: qty 2

## 2019-12-27 NOTE — MAU Provider Note (Signed)
Chief Complaint:  Hypertension and Abdominal Pain   First Provider Initiated Contact with Patient 12/27/19 1215      HPI: Julie King is a 29 y.o. G2P1001 at [redacted]w[redacted]d who presents to maternity admissions reporting what feels like menstrual cramps. Cramping started a few days ago. At first, cramping would come and go every few hours. Cramping became more consistent today and reports feeling this every 3-10 minutes. Reports bright red blood with wiping yesterday and not sure if this was from her urine or vagina. Reports some low back pain. Rates pain with cramping at 6/10. No upper abdominal pain. Denies vision changes, SOB, cough. Denies vaginal discharge or concern for STDs. She has taken Tylenol which has not helped with pain. Eating and drinking normally. She was previously prescribed Labetalol 200 mg BID but was told to stop taking this as her pressures were low normal in clinic. She called clinic this morning and was told to take 100 mg Labetalol and come to MAU for evaluation. She reports good fetal movement, denies LOF, vaginal bleeding, vaginal itching/burning, urinary symptoms, h/a, dizziness, n/v, or fever/chills.    Past Medical History: Past Medical History:  Diagnosis Date  . Atrial fibrillation ([place])   . Headache 07/25/2017  . Hypertension   . Irritable bowel syndrome   . Morbidly obese ([place]) 07/25/2017  . QT prolongation   . T wave inversion in EKG     Past obstetric history: OB History  Gravida Para Term Preterm AB Living  2 1 1     1   SAB TAB Ectopic Multiple Live Births          1    # Outcome Date GA Lbr Len/2nd Weight Sex Delivery Anes PTL Lv  2 Current           1 Term 2013    F CS-LTranv   LIV    Past Surgical History: Past Surgical History:  Procedure Laterality Date  . CESAREAN SECTION    . CHOLECYSTECTOMY    . HERNIA REPAIR    . TONSILLECTOMY      Family History: Family History  Problem Relation Age of Onset  . Hypertension Other   . Diabetes  Other   . Crohn's disease Mother   . Hypertension Mother   . Thyroid disease Father   . Diabetes Father   . Breast cancer Maternal Aunt        late 49 early 23s  . Breast cancer Maternal Grandmother        late 19s early 62    Social History: Social History   Tobacco Use  . Smoking status: Former Smoker    Packs/day: 0.50    Years: 10.00    Pack years: 5.00    Types: Cigarettes  . Smokeless tobacco: Never Used  . Tobacco comment: wants to quit but states very difficult Jan 2020  Substance Use Topics  . Alcohol use: Not Currently  . Drug use: No    Allergies:  Allergies  Allergen Reactions  . Percocet [Oxycodone-Acetaminophen] Hives, Itching and Nausea Only  . Pineapple Itching    Fresh pineapples; pineapples from a can is okay    Meds:  Medications Prior to Admission  Medication Sig Dispense Refill Last Dose  . aspirin EC 81 MG tablet Take 81 mg by mouth daily.   12/27/2019 at Unknown time  . Ferrous Sulfate (IRON PO) Take by mouth.   12/27/2019 at Unknown time  . Prenatal Vit-Fe Fumarate-FA (PRENATAL MULTIVITAMIN) TABS tablet  Take 1 tablet by mouth daily at 12 noon.   12/27/2019 at Unknown time  . progesterone 200 MG SUPP Place 200 mg vaginally at bedtime.   12/26/2019 at Unknown time  . Progesterone Micronized (PROGESTERONE PO) Take by mouth.   12/26/2019 at Unknown time  . [DISCONTINUED] labetalol (NORMODYNE) 200 MG tablet Take 1 tablet (200 mg total) by mouth 2 (two) times daily. (Patient taking differently: Take 200 mg by mouth 2 (two) times daily. Took 1/2 tab today per MD) 60 tablet 3 12/27/2019 at Unknown time  . albuterol (PROVENTIL HFA;VENTOLIN HFA) 108 (90 Base) MCG/ACT inhaler Inhale 2 puffs into the lungs every 6 (six) hours as needed for wheezing or shortness of breath. 1 Inhaler 5 More than a month at Unknown time  . Cetirizine HCl 10 MG CAPS Take 1 capsule (10 mg total) by mouth daily for 10 days. 10 capsule 0   . progesterone (ENDOMETRIN) 100 MG vaginal insert  Place 100 mg vaginally 2 (two) times daily.     . promethazine (PHENERGAN) 25 MG tablet Take 1 tablet (25 mg total) by mouth every 6 (six) hours as needed for nausea or vomiting. (Patient not taking: Reported on 12/26/2019) 30 tablet 0     ROS:  Review of Systems All other systems negative unless noted above in HPI.   I have reviewed patient's Past Medical Hx, Surgical Hx, Family Hx, Social Hx, medications and allergies.   Physical Exam   Patient Vitals for the past 24 hrs:  BP Temp Temp src Pulse Resp SpO2 Weight  12/27/19 1400 130/63 -- -- 93 -- -- --  12/27/19 1345 140/74 -- -- 89 -- -- --  12/27/19 1330 132/74 -- -- 95 -- -- --  12/27/19 1319 136/70 -- -- 93 -- -- --  12/27/19 1301 (!) 163/100 -- -- 91 -- -- --  12/27/19 1245 (!) 165/91 -- -- 94 -- 97 % --  12/27/19 1241 -- -- -- -- -- 100 % --  12/27/19 1235 -- -- -- -- -- 100 % --  12/27/19 1230 (!) 153/79 -- -- 95 -- 97 % --  12/27/19 1216 131/76 -- -- 95 -- 98 % --  12/27/19 1204 (!) 146/74 98.3 F (36.8 C) Oral 99 17 98 % --  12/27/19 1157 -- -- -- -- -- -- (!) 136.3 kg   Constitutional: Well-developed, well-nourished female in no acute distress.  Cardiovascular: normal rate Respiratory: normal effort GI: Abd soft, non-tender, gravid appropriate for gestational age.  MS: Extremities nontender, no edema, normal ROM Neurologic: Alert and oriented x 4.  GU: Neg CVAT. PELVIC EXAM: Cervix pink, visually closed, without lesion, scant white creamy discharge, vaginal walls and external genitalia normal, no vaginal bleeding noted Bimanual exam: Cervix 0/long/high, firm, anterior, neg CMT, uterus nontender, nonenlarged, adnexa without tenderness, enlargement, or mass  Dilation: Closed Effacement (%): Thick Station: -3 Presentation: Undeterminable Exam by:: Dr. Marice Potter BSUS: Vertex  FHT:  Baseline 125, moderate variability, accelerations present, no decelerations Contractions: None   Labs: Results for orders placed or  performed during the hospital encounter of 12/27/19 (from the past 24 hour(s))  Comprehensive metabolic panel     Status: Abnormal   Collection Time: 12/27/19 12:45 PM  Result Value Ref Range   Sodium 138 135 - 145 mmol/L   Potassium 4.6 3.5 - 5.1 mmol/L   Chloride 108 98 - 111 mmol/L   CO2 21 (L) 22 - 32 mmol/L   Glucose, Bld 79 70 - 99 mg/dL  BUN 6 6 - 20 mg/dL   Creatinine, Ser 0.51 0.44 - 1.00 mg/dL   Calcium 8.9 8.9 - 10.3 mg/dL   Total Protein 6.3 (L) 6.5 - 8.1 g/dL   Albumin 2.7 (L) 3.5 - 5.0 g/dL   AST 14 (L) 15 - 41 U/L   ALT 15 0 - 44 U/L   Alkaline Phosphatase 72 38 - 126 U/L   Total Bilirubin 0.2 (L) 0.3 - 1.2 mg/dL   GFR calc non Af Amer >60 >60 mL/min   GFR calc Af Amer >60 >60 mL/min   Anion gap 9 5 - 15  CBC     Status: Abnormal   Collection Time: 12/27/19 12:45 PM  Result Value Ref Range   WBC 10.2 4.0 - 10.5 K/uL   RBC 4.86 3.87 - 5.11 MIL/uL   Hemoglobin 9.7 (L) 12.0 - 15.0 g/dL   HCT 31.2 (L) 36.0 - 46.0 %   MCV 64.2 (L) 80.0 - 100.0 fL   MCH 20.0 (L) 26.0 - 34.0 pg   MCHC 31.1 30.0 - 36.0 g/dL   RDW 16.3 (H) 11.5 - 15.5 %   Platelets 243 150 - 400 K/uL   nRBC 0.0 0.0 - 0.2 %  Protein / creatinine ratio, urine     Status: Abnormal   Collection Time: 12/27/19 12:48 PM  Result Value Ref Range   Creatinine, Urine 286.31 mg/dL   Total Protein, Urine 52 mg/dL   Protein Creatinine Ratio 0.18 (H) 0.00 - 0.15 mg/mg[Cre]  Urinalysis, Routine w reflex microscopic     Status: Abnormal   Collection Time: 12/27/19 12:48 PM  Result Value Ref Range   Color, Urine YELLOW YELLOW   APPearance HAZY (A) CLEAR   Specific Gravity, Urine 1.034 (H) 1.005 - 1.030   pH 6.0 5.0 - 8.0   Glucose, UA NEGATIVE NEGATIVE mg/dL   Hgb urine dipstick NEGATIVE NEGATIVE   Bilirubin Urine NEGATIVE NEGATIVE   Ketones, ur NEGATIVE NEGATIVE mg/dL   Protein, ur 30 (A) NEGATIVE mg/dL   Nitrite NEGATIVE NEGATIVE   Leukocytes,Ua NEGATIVE NEGATIVE   RBC / HPF 0-5 0 - 5 RBC/hpf   WBC, UA  0-5 0 - 5 WBC/hpf   Bacteria, UA FEW (A) NONE SEEN   Squamous Epithelial / LPF 11-20 0 - 5   Mucus PRESENT    Ca Oxalate Crys, UA PRESENT       Imaging:  [place] MFM OB DETAIL +14 WK  Result Date: 12/13/2019 ----------------------------------------------------------------------  OBSTETRICS REPORT                       (Signed Final 12/13/2019 02:21 pm) ---------------------------------------------------------------------- Patient Info  ID #:       [id number]                          D.O.B.:  Jun 08, 1991 (28 yrs)  Name:       [facility]                 Visit Date: 12/13/2019 10:36 am ---------------------------------------------------------------------- Performed By  Performed By:     Hubert Azure          Ref. Address:     Faculty                    RDMS  Attending:        Johnell Comings MD         [facility]  Referred By:      Vickii Chafe                    CONSTANT MD ---------------------------------------------------------------------- Orders   #  Description                          Code         Ordered By   1  [place] MFM OB DETAIL +14 [place]              76811.01     PEGGY CONSTANT  ----------------------------------------------------------------------   #  Order #                    Accession #                 Episode #   1  [id number]                  [id number]                  [id number]  ---------------------------------------------------------------------- Indications   Hypertension - Chronic/Pre-existing (not       O10.019   taking prescribed labetalol)   Obesity complicating pregnancy, second         O99.212   trimester   [redacted] weeks gestation of pregnancy                Z3A.29   Encounter for antenatal screening for          Z36.3   malformations   History of cesarean delivery, currently        O44.219   pregnant  ---------------------------------------------------------------------- Vital Signs  Weight (lb): 297                                Height:        5'7"  BMI:         46.51 ---------------------------------------------------------------------- Fetal Evaluation  Num Of Fetuses:         1  Fetal Heart Rate(bpm):  154  Cardiac Activity:       Observed  Presentation:           Cephalic  Placenta:               Anterior  P. Cord Insertion:      Visualized, central  Amniotic Fluid  AFI FV:      Within normal limits  AFI Sum(cm)     %Tile       Largest Pocket(cm)  10.94           20          3.54  RUQ(cm)       RLQ(cm)       LUQ(cm)        LLQ(cm)  3.54          2.99          1.04           3.37 ---------------------------------------------------------------------- Biometry  BPD:      73.7  mm     G. Age:  29w 4d         42  %    CI:        69.71   %    70 - 86                                                          FL/HC:      18.7   %    19.6 - 20.8  HC:      281.7  mm     G. Age:  30w 6d         59  %    HC/AC:      1.01        0.99 - 1.21  AC:      277.8  mm     G. Age:  31w 6d         96  %    FL/BPD:     71.4   %    71 - 87  FL:       52.6  mm     G. Age:  28w 0d          7  %    FL/AC:      18.9   %    20 - 24  HUM:      49.8  mm     G. Age:  29w 2d         43  %  CER:      36.8  mm     G. Age:  31w 6d         83  %  LV:        3.9  mm  Est. FW:    1563  gm      3 lb 7 oz     72  % ---------------------------------------------------------------------- OB History  Gravidity:    2         Term:   1 ---------------------------------------------------------------------- Gestational Age  LMP:           27w 5d        Date:  06/02/19                 EDD:   03/08/20  Clinical EDD:  29w 3d                                        EDD:   02/25/20  U/S Today:     30w 1d                                        EDD:   02/20/20  Best:          29w 3d     Det. By:  Clinical EDD             EDD:   02/25/20 ---------------------------------------------------------------------- Anatomy  Cranium:               Appears normal  Aortic  Arch:            Appears normal  Cavum:                 Appears normal         Ductal Arch:            Not well visualized  Ventricles:            Appears normal         Diaphragm:              Appears normal  Choroid Plexus:        Not well visualized    Stomach:                Appears normal, left                                                                        sided  Cerebellum:            Appears normal         Abdomen:                Appears normal  Posterior Fossa:       Not well visualized    Abdominal Wall:         Not well visualized  Nuchal Fold:           Not applicable ([id number]    Cord Vessels:           Not well visualized                         wks GA)  Face:                  Appears normal         Kidneys:                Appear normal                         (orbits and profile)  Lips:                  Appears normal         Bladder:                Appears normal  Thoracic:              Appears normal         Spine:                  Appears normal  Heart:                 Appears normal         Upper Extremities:      Not well visualized                         (4CH, axis, and                         situs)  RVOT:  Appears normal         Lower Extremities:      Appears normal  LVOT:                  Appears normal  Other:  Fetus appears to be a female. Nasal bone visualized. Technically          difficult due to maternal habitus and fetal position. ---------------------------------------------------------------------- Comments  This patient was seen for a detailed fetal anatomy scan due  to maternal obesity and history of chronic hypertension that is  not currently treated with any medications.  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  She  had her prior prenatal ultrasounds performed with MFM at  [facility].  She denies any other significant past medical history and  denies any problems in her current pregnancy.  She has declined all screening tests for  fetal aneuploidy in  her current pregnancy.  She was informed that the fetal growth and amniotic fluid  level were appropriate for her gestational age.  The views of the fetal anatomy were limited today due to her  advanced gestational age.  The patient was informed that anomalies may be missed due  to technical limitations. If the fetus is in a suboptimal position  or maternal habitus is increased, visualization of the fetus in  the maternal uterus may be impaired.  Due to her history of chronic hypertension, a follow-up growth  scan was scheduled in 4 weeks. ----------------------------------------------------------------------                   Johnell Comings, MD Electronically Signed Final Report   12/13/2019 02:21 pm ----------------------------------------------------------------------   MAU Course/MDM: Orders Placed This Encounter  Procedures  . Comprehensive metabolic panel  . CBC  . Protein / creatinine ratio, urine  . Urinalysis, Routine w reflex microscopic  . Notify Physician  . Measure blood pressure  . Discharge patient    Meds ordered this encounter  Medications  . cyclobenzaprine (FLEXERIL) tablet 10 mg  . AND Linked Order Group   . labetalol (NORMODYNE) injection 20 mg   . labetalol (NORMODYNE) injection 40 mg   . labetalol (NORMODYNE) injection 80 mg   . hydrALAZINE (APRESOLINE) injection 10 mg  . labetalol (NORMODYNE) 200 MG tablet    Sig: Take 1 tablet (200 mg total) by mouth 2 (two) times daily.    Dispense:  90 tablet    Refill:  0  . cyclobenzaprine (FLEXERIL) 10 MG tablet    Sig: Take 1 tablet (10 mg total) by mouth daily as needed for muscle spasms (cramping).    Dispense:  30 tablet    Refill:  0  . ferrous sulfate 325 (65 FE) MG tablet    Sig: Take 1 tablet (325 mg total) by mouth daily.    Dispense:  30 tablet    Refill:  3     NST reviewed: Reactive as stated above. No ctx noted on TOCO. Patient presented for cramping and elevated blood pressure in third  trimester. Questionable history of cHTN and previously on Labetalol but not currently as described above. Did not have severe-range pressures at home but noted SBP's in 140-150 range. No symptoms of Pre-E and labs stable. Patient was given Flexeril for low back pain and mild cramping and reported improvement in symptoms. Cervix closed/thick/hi and vertex by BSUS. Patient did have 2 severe-range BP's 15 minutes apart in MAU and was given one dose  of IV Labetalol with improvement. D/w Dr. Harolyn Rutherford who reviewed chart as well. Likely cHTN as labs normal and patient asymptomatic. Will have patient restart Labetalol 200 mg BID and BP check/visit requested for Monday or Tuesday in clinic. Patient also with anemia of pregnancy and had iron on her PTA medications but reports she has not really been taking this. Discussed anemia and importance of increasing Hgb level. Flexeril prescribed to take PRN for low back pain and cramping.   Pt discharged with strict return precautions.  Assessment: 1. Elevated blood pressure affecting pregnancy in third trimester, antepartum   2. Anemia in pregnancy, third trimester   3. Previous cesarean delivery affecting pregnancy   4. Chronic hypertension affecting pregnancy   5. History of atrial fibrillation   6. Maternal obesity affecting pregnancy, antepartum   7. No leakage of amniotic fluid into vagina   8. Vaginal discharge   9. Pelvic cramping in antepartum period   10. Chronic hypertension in obstetric context in second trimester     Plan: Discharge home Labor precautions and fetal kick counts Follow-up [place] Follow up.   Specialty: Obstetrics and Gynecology Contact information: [address] [place] [phone]         Allergies as of 12/27/2019      Reactions   Percocet [oxycodone-acetaminophen] Hives, Itching, Nausea Only   Pineapple Itching   Fresh pineapples;  pineapples from a can is okay      Medication List    TAKE these medications   albuterol 108 (90 Base) MCG/ACT inhaler Commonly known as: VENTOLIN HFA Inhale 2 puffs into the lungs every 6 (six) hours as needed for wheezing or shortness of breath.   aspirin EC 81 MG tablet Take 81 mg by mouth daily.   Cetirizine HCl 10 MG Caps Take 1 capsule (10 mg total) by mouth daily for 10 days.   cyclobenzaprine 10 MG tablet Commonly known as: FLEXERIL Take 1 tablet (10 mg total) by mouth daily as needed for muscle spasms (cramping).   ferrous sulfate 325 (65 FE) MG tablet Take 1 tablet (325 mg total) by mouth daily. What changed:   medication strength  how much to take  when to take this   labetalol 200 MG tablet Commonly known as: NORMODYNE Take 1 tablet (200 mg total) by mouth 2 (two) times daily. What changed: additional instructions   prenatal multivitamin Tabs tablet Take 1 tablet by mouth daily at 12 noon.   progesterone 200 MG Supp Place 200 mg vaginally at bedtime.   progesterone 100 MG vaginal insert Commonly known as: ENDOMETRIN Place 100 mg vaginally 2 (two) times daily.   PROGESTERONE PO Take by mouth.   promethazine 25 MG tablet Commonly known as: PHENERGAN Take 1 tablet (25 mg total) by mouth every 6 (six) hours as needed for nausea or vomiting.       Barrington Ellison, MD [facility] Family Medicine Fellow, [facility] [place] [facility] 12/27/2019 2:07 PM

## 2019-12-27 NOTE — MAU Note (Signed)
Patient presents to MAU with complaints of abdominal cramping for past 4 days; now feels like they are 3-10 min apart. Got a new BP cuff yesterday and today it read 140s/90s.  Had a HA and seeing spots earlier today, but not now.  Pt also reports some bleeding last night when she wiped, but unsure of the source.  Pt questioned possible hemorrhoid? Reports good fetal movement.  Denies LOF.

## 2019-12-31 ENCOUNTER — Ambulatory Visit (INDEPENDENT_AMBULATORY_CARE_PROVIDER_SITE_OTHER): Payer: BC Managed Care – PPO

## 2019-12-31 ENCOUNTER — Other Ambulatory Visit: Payer: Self-pay

## 2019-12-31 VITALS — BP 117/75 | HR 93 | Wt 299.9 lb

## 2019-12-31 DIAGNOSIS — Z0130 Encounter for examination of blood pressure without abnormal findings: Secondary | ICD-10-CM | POA: Diagnosis not present

## 2019-12-31 NOTE — Progress Notes (Signed)
Subjective:  Julie King is a 29 y.o. female here for BP check.   Hypertension ROS: C/o intermittent headaches 4/10, auroras, cramping 6/10 and heaviness between her breast. taking medications as instructed, no medication side effects noted, no TIA's, no chest pain on exertion, no dyspnea on exertion and no swelling of ankles.    Objective:  BP 117/75   Pulse 93   Wt 299 lb 14.4 oz (136 kg)   LMP 06/02/2019   BMI 46.97 kg/m   Appearance alert, well appearing, and in no distress. General exam BP noted to be well controlled today in office.    Assessment:   Blood Pressure stable.   Plan: Per DR. Fair Current treatment plan is effective, no change in therapy. Patient to continue taking Flexeril, labetalol, Tylenol and go to MAU if sx gets worse or does not improve

## 2019-12-31 NOTE — Progress Notes (Addendum)
Patient seen and assessed by nursing staff during this encounter. I have reviewed the chart and agree with the documentation and plan. I have also made any necessary editorial changes. I actually saw patient in MAU on 4/30 and Pre-E labs negative. She was restarted on Labetalol which she has not yet taken today and BP is normal today regardless. She has not yet taken medication for headache as was recommended. Try Tylenol and Flexeril and if not improved, go to MAU. Take Labetalol as prescribed.   Chauncey Mann, MD 12/31/2019 10:37 AM

## 2020-01-09 ENCOUNTER — Encounter: Payer: Self-pay | Admitting: Obstetrics and Gynecology

## 2020-01-09 ENCOUNTER — Telehealth (INDEPENDENT_AMBULATORY_CARE_PROVIDER_SITE_OTHER): Payer: BC Managed Care – PPO | Admitting: Obstetrics and Gynecology

## 2020-01-09 DIAGNOSIS — O99213 Obesity complicating pregnancy, third trimester: Secondary | ICD-10-CM

## 2020-01-09 DIAGNOSIS — Z3480 Encounter for supervision of other normal pregnancy, unspecified trimester: Secondary | ICD-10-CM

## 2020-01-09 DIAGNOSIS — Z3A33 33 weeks gestation of pregnancy: Secondary | ICD-10-CM

## 2020-01-09 DIAGNOSIS — Z8679 Personal history of other diseases of the circulatory system: Secondary | ICD-10-CM

## 2020-01-09 DIAGNOSIS — O99013 Anemia complicating pregnancy, third trimester: Secondary | ICD-10-CM

## 2020-01-09 DIAGNOSIS — O34219 Maternal care for unspecified type scar from previous cesarean delivery: Secondary | ICD-10-CM

## 2020-01-09 DIAGNOSIS — O10919 Unspecified pre-existing hypertension complicating pregnancy, unspecified trimester: Secondary | ICD-10-CM

## 2020-01-09 DIAGNOSIS — O99210 Obesity complicating pregnancy, unspecified trimester: Secondary | ICD-10-CM

## 2020-01-09 DIAGNOSIS — O10913 Unspecified pre-existing hypertension complicating pregnancy, third trimester: Secondary | ICD-10-CM

## 2020-01-09 NOTE — Progress Notes (Signed)
   OBSTETRICS PRENATAL VIRTUAL VISIT ENCOUNTER NOTE  Provider location: [facility] [place]   I connected with Julie King on 01/09/20 at  8:45 AM EDT by MyChart Video Encounter at home and verified that I am speaking with the correct person using two identifiers.   I discussed the limitations, risks, security and privacy concerns of performing an evaluation and management service virtually and the availability of in person appointments. I also discussed with the patient that there may be a patient responsible charge related to this service. The patient expressed understanding and agreed to proceed. Subjective:  Julie King is a 29 y.o. G2P1001 at [redacted]w[redacted]d being seen today for ongoing prenatal care.  She is currently monitored for the following issues for this high-risk pregnancy and has Headache; Morbidly obese ([place]); No leakage of amniotic fluid into vagina; Vaginal discharge; Supervision of other normal pregnancy, antepartum; Anemia in pregnancy, third trimester; Previous cesarean delivery affecting pregnancy; Chronic hypertension affecting pregnancy; History of atrial fibrillation; and Maternal obesity affecting pregnancy, antepartum on their problem list.  Patient reports charlie horses, some discomfort in chest that feels like "she has smoked a pack of cigarettes", strong kicks from baby, concerned about low iron.. Discomfort in chest feels "heavy", not painful or stabbing. Occasionally has palpitations with this.  Contractions: Not present. Vag. Bleeding: None.  Movement: Present. Denies any leaking of fluid.   The following portions of the patient's history were reviewed and updated as appropriate: allergies, current medications, past family history, past medical history, past social history, past surgical history and problem list.   Objective:  There were no vitals filed for this visit.  Fetal Status:     Movement: Present     General:  Alert, oriented and  cooperative. Patient is in no acute distress.  Respiratory: Normal respiratory effort, no problems with respiration noted  Mental Status: Normal mood and affect. Normal behavior. Normal judgment and thought content.  Rest of physical exam deferred due to type of encounter  Imaging:   Assessment and Plan:  Pregnancy: G2P1001 at [redacted]w[redacted]d  1. Anemia in pregnancy, third trimester Cont PO iron   2. Previous cesarean delivery affecting pregnancy For TOLAC  3. Chronic hypertension affecting pregnancy Cont baby ASA On labetalol 200 mg BID  4. History of atrial fibrillation Sees cardiology soon Asked her to mention heaviness in chest to cardiologist, go to hospital if she has pain or shortness of breath  5. Maternal obesity affecting pregnancy, antepartum  6. Supervision of other normal pregnancy, antepartum  Preterm labor symptoms and general obstetric precautions including but not limited to vaginal bleeding, contractions, leaking of fluid and fetal movement were reviewed in detail with the patient. I discussed the assessment and treatment plan with the patient. The patient was provided an opportunity to ask questions and all were answered. The patient agreed with the plan and demonstrated an understanding of the instructions. The patient was advised to call back or seek an in-person office evaluation/go to MAU at [facility] for any urgent or concerning symptoms. Please refer to After Visit Summary for other counseling recommendations.   I provided 15 minutes of face-to-face time during this encounter.  Return in about 2 weeks (around 01/23/2020) for high OB, virtual.  Future Appointments  Date Time Provider [place]  01/10/2020 12:45 PM [facility] NURSE [facility]  01/10/2020 12:45 PM [facility] US5 [facility] [place] for [place], [place]

## 2020-01-09 NOTE — Progress Notes (Signed)
I connected with  Julie King on 01/09/20 by a video enabled telemedicine application and verified that I am speaking with the correct person using two identifiers.   I discussed the limitations of evaluation and management by telemedicine. The patient expressed understanding and agreed to proceed.  [facility], c/o intermittent heavy pressure and pain in stomach.

## 2020-01-10 ENCOUNTER — Other Ambulatory Visit: Payer: Self-pay

## 2020-01-10 ENCOUNTER — Ambulatory Visit: Payer: BC Managed Care – PPO | Admitting: *Deleted

## 2020-01-10 ENCOUNTER — Other Ambulatory Visit: Payer: Self-pay | Admitting: *Deleted

## 2020-01-10 ENCOUNTER — Ambulatory Visit (HOSPITAL_COMMUNITY): Payer: BC Managed Care – PPO | Attending: Obstetrics and Gynecology

## 2020-01-10 ENCOUNTER — Encounter: Payer: Self-pay | Admitting: *Deleted

## 2020-01-10 DIAGNOSIS — O34219 Maternal care for unspecified type scar from previous cesarean delivery: Secondary | ICD-10-CM

## 2020-01-10 DIAGNOSIS — Z0371 Encounter for suspected problem with amniotic cavity and membrane ruled out: Secondary | ICD-10-CM | POA: Insufficient documentation

## 2020-01-10 DIAGNOSIS — O99213 Obesity complicating pregnancy, third trimester: Secondary | ICD-10-CM | POA: Diagnosis not present

## 2020-01-10 DIAGNOSIS — E669 Obesity, unspecified: Secondary | ICD-10-CM

## 2020-01-10 DIAGNOSIS — O99210 Obesity complicating pregnancy, unspecified trimester: Secondary | ICD-10-CM | POA: Diagnosis present

## 2020-01-10 DIAGNOSIS — Z362 Encounter for other antenatal screening follow-up: Secondary | ICD-10-CM | POA: Diagnosis not present

## 2020-01-10 DIAGNOSIS — O10919 Unspecified pre-existing hypertension complicating pregnancy, unspecified trimester: Secondary | ICD-10-CM

## 2020-01-10 DIAGNOSIS — N898 Other specified noninflammatory disorders of vagina: Secondary | ICD-10-CM

## 2020-01-10 DIAGNOSIS — Z8679 Personal history of other diseases of the circulatory system: Secondary | ICD-10-CM | POA: Insufficient documentation

## 2020-01-10 DIAGNOSIS — O10013 Pre-existing essential hypertension complicating pregnancy, third trimester: Secondary | ICD-10-CM | POA: Diagnosis not present

## 2020-01-10 DIAGNOSIS — O99013 Anemia complicating pregnancy, third trimester: Secondary | ICD-10-CM

## 2020-01-10 DIAGNOSIS — Z3A33 33 weeks gestation of pregnancy: Secondary | ICD-10-CM

## 2020-01-20 ENCOUNTER — Inpatient Hospital Stay (HOSPITAL_COMMUNITY)
Admission: AD | Admit: 2020-01-20 | Discharge: 2020-01-20 | Disposition: A | Discharge status: Home / Self Care | Payer: BC Managed Care – PPO | Attending: Obstetrics and Gynecology | Admitting: Obstetrics and Gynecology

## 2020-01-20 ENCOUNTER — Other Ambulatory Visit: Payer: Self-pay

## 2020-01-20 ENCOUNTER — Encounter (HOSPITAL_COMMUNITY): Payer: Self-pay | Admitting: Obstetrics and Gynecology

## 2020-01-20 DIAGNOSIS — Z8249 Family history of ischemic heart disease and other diseases of the circulatory system: Secondary | ICD-10-CM | POA: Diagnosis not present

## 2020-01-20 DIAGNOSIS — Z7982 Long term (current) use of aspirin: Secondary | ICD-10-CM | POA: Insufficient documentation

## 2020-01-20 DIAGNOSIS — O99613 Diseases of the digestive system complicating pregnancy, third trimester: Secondary | ICD-10-CM | POA: Insufficient documentation

## 2020-01-20 DIAGNOSIS — O99413 Diseases of the circulatory system complicating pregnancy, third trimester: Secondary | ICD-10-CM | POA: Insufficient documentation

## 2020-01-20 DIAGNOSIS — O10913 Unspecified pre-existing hypertension complicating pregnancy, third trimester: Secondary | ICD-10-CM | POA: Insufficient documentation

## 2020-01-20 DIAGNOSIS — Z8379 Family history of other diseases of the digestive system: Secondary | ICD-10-CM | POA: Insufficient documentation

## 2020-01-20 DIAGNOSIS — O4703 False labor before 37 completed weeks of gestation, third trimester: Secondary | ICD-10-CM | POA: Diagnosis not present

## 2020-01-20 DIAGNOSIS — K589 Irritable bowel syndrome without diarrhea: Secondary | ICD-10-CM | POA: Insufficient documentation

## 2020-01-20 DIAGNOSIS — Z98891 History of uterine scar from previous surgery: Secondary | ICD-10-CM | POA: Diagnosis not present

## 2020-01-20 DIAGNOSIS — O479 False labor, unspecified: Secondary | ICD-10-CM

## 2020-01-20 DIAGNOSIS — O26893 Other specified pregnancy related conditions, third trimester: Secondary | ICD-10-CM | POA: Insufficient documentation

## 2020-01-20 DIAGNOSIS — I4891 Unspecified atrial fibrillation: Secondary | ICD-10-CM | POA: Insufficient documentation

## 2020-01-20 DIAGNOSIS — O99213 Obesity complicating pregnancy, third trimester: Secondary | ICD-10-CM | POA: Insufficient documentation

## 2020-01-20 DIAGNOSIS — Z87891 Personal history of nicotine dependence: Secondary | ICD-10-CM | POA: Insufficient documentation

## 2020-01-20 DIAGNOSIS — Z885 Allergy status to narcotic agent status: Secondary | ICD-10-CM | POA: Diagnosis not present

## 2020-01-20 DIAGNOSIS — Z79899 Other long term (current) drug therapy: Secondary | ICD-10-CM | POA: Insufficient documentation

## 2020-01-20 DIAGNOSIS — Z3A34 34 weeks gestation of pregnancy: Secondary | ICD-10-CM | POA: Diagnosis not present

## 2020-01-20 LAB — URINALYSIS, ROUTINE W REFLEX MICROSCOPIC
Bilirubin Urine: NEGATIVE
Glucose, UA: NEGATIVE mg/dL
Hgb urine dipstick: NEGATIVE
Ketones, ur: NEGATIVE mg/dL
Leukocytes,Ua: NEGATIVE
Nitrite: NEGATIVE
Protein, ur: 30 mg/dL — AB
Specific Gravity, Urine: 1.029 (ref 1.005–1.030)
pH: 6 (ref 5.0–8.0)

## 2020-01-20 NOTE — Progress Notes (Signed)
Discharge AVS reviewed with patinet, pt instructed to return for VB, LOF, more than 5 cxt/hr, decreased FM. Patient verbalized understanding and d/c home ambulatory.

## 2020-01-20 NOTE — MAU Note (Signed)
Patient c/o abdominal pain starting on Saturday and slowly progressing and causing her to be unable to sleep last night. Vaginal pressure starting yesterday. Denies VB and LOF, but does state that she has had increased vaginal discharge.

## 2020-01-20 NOTE — MAU Provider Note (Signed)
Chief Complaint:  Abdominal Pain and Pelvic Pain (pressure )   First Provider Initiated Contact with Patient 01/20/20 1059     HPI: Julie King is a 29 y.o. G2P1001 at [redacted]w[redacted]d who presents to maternity admissions reporting contractions and pelvic pressure.  Symptoms started yesterday.  Reports tightening of her abdomen every few minutes that have decreased since arriving to MAU.  Also reports pelvic pressure when she stands.  Has had some increased vaginal discharge.  Discharge is mucoid and white.  No odor to discharge and no itching or irritation.  Denies dysuria, vaginal bleeding, or leaking of fluid.  Good fetal movement.  Location: abdomen Quality: tightening Severity: 6/10 in pain scale Duration: 2 days Timing: intermittent Modifying factors: none Associated signs and symptoms: vaginal discharge  Pregnancy Course: [facility], wants TOLAC  Past Medical History:  Diagnosis Date  . Atrial fibrillation ([place])   . Headache 07/25/2017  . Hypertension   . Irritable bowel syndrome   . Morbidly obese ([place]) 07/25/2017  . QT prolongation   . T wave inversion in EKG    OB History  Gravida Para Term Preterm AB Living  2 1 1     1   SAB TAB Ectopic Multiple Live Births          1    # Outcome Date GA Lbr Len/2nd Weight Sex Delivery Anes PTL Lv  2 Current           1 Term 2013    F CS-LTranv   LIV   Past Surgical History:  Procedure Laterality Date  . CESAREAN SECTION    . CHOLECYSTECTOMY    . HERNIA REPAIR    . TONSILLECTOMY     Family History  Problem Relation Age of Onset  . Hypertension Other   . Diabetes Other   . Crohn's disease Mother   . Hypertension Mother   . Thyroid disease Father   . Diabetes Father   . Breast cancer Maternal Aunt        late 48 early 29s  . Breast cancer Maternal Grandmother        late 77s early 67   Social History   Tobacco Use  . Smoking status: Former Smoker    Packs/day: 0.50    Years: 10.00    Pack years: 5.00    Types: Cigarettes   . Smokeless tobacco: Never Used  . Tobacco comment: wants to quit but states very difficult Jan 2020  Substance Use Topics  . Alcohol use: Not Currently  . Drug use: No   Allergies  Allergen Reactions  . Percocet [Oxycodone-Acetaminophen] Hives, Itching and Nausea Only  . Pineapple Itching    Fresh pineapples; pineapples from a can is okay   Medications Prior to Admission  Medication Sig Dispense Refill Last Dose  . albuterol (PROVENTIL HFA;VENTOLIN HFA) 108 (90 Base) MCG/ACT inhaler Inhale 2 puffs into the lungs every 6 (six) hours as needed for wheezing or shortness of breath. 1 Inhaler 5   . aspirin EC 81 MG tablet Take 81 mg by mouth daily.     . Cetirizine HCl 10 MG CAPS Take 1 capsule (10 mg total) by mouth daily for 10 days. 10 capsule 0   . cyclobenzaprine (FLEXERIL) 10 MG tablet Take 1 tablet (10 mg total) by mouth daily as needed for muscle spasms (cramping). 30 tablet 0   . ferrous sulfate 325 (65 FE) MG tablet Take 1 tablet (325 mg total) by mouth daily. 30 tablet 3   .  labetalol (NORMODYNE) 200 MG tablet Take 1 tablet (200 mg total) by mouth 2 (two) times daily. 90 tablet 0   . Prenatal Vit-Fe Fumarate-FA (PRENATAL MULTIVITAMIN) TABS tablet Take 1 tablet by mouth daily at 12 noon.     . progesterone (ENDOMETRIN) 100 MG vaginal insert Place 100 mg vaginally 2 (two) times daily.     . progesterone 200 MG SUPP Place 200 mg vaginally at bedtime.     . Progesterone Micronized (PROGESTERONE PO) Take by mouth.     . promethazine (PHENERGAN) 25 MG tablet Take 1 tablet (25 mg total) by mouth every 6 (six) hours as needed for nausea or vomiting. (Patient not taking: Reported on 12/26/2019) 30 tablet 0     I have reviewed patient's Past Medical Hx, Surgical Hx, Family Hx, Social Hx, medications and allergies.   ROS:  Review of Systems  Constitutional: Negative.   Gastrointestinal: Positive for abdominal pain. Negative for constipation, diarrhea, nausea and vomiting.   Genitourinary: Positive for vaginal discharge. Negative for dysuria and vaginal bleeding.  Musculoskeletal: Positive for back pain.    Physical Exam   Patient Vitals for the past 24 hrs:  BP Temp Temp src Pulse Resp SpO2 Height Weight  01/20/20 0959 -- 98.5 F (36.9 C) Oral (!) 102 19 99 % 5\' 7"  (1.702 m) 134.3 kg  01/20/20 0955 126/67 -- -- (!) 107 -- 99 % -- --    Constitutional: Well-developed, well-nourished female in no acute distress.  Cardiovascular: normal rate & rhythm, no murmur Respiratory: normal effort, lung sounds clear throughout GI: Abd soft, non-tender, gravid appropriate for gestational age. Pos BS x 4 MS: Extremities nontender, no edema, normal ROM Neurologic: Alert and oriented x 4.  GU:   NEFG, no blood, no abnormal discharge   Dilation: Closed Effacement (%): Thick Cervical Position: Posterior Station: -3 Exam by:: Jorje Guild NP  NST:  Baseline: 130 bpm, Variability: Good {> 6 bpm), Accelerations: Reactive and Decelerations: Absent   Labs: Results for orders placed or performed during the hospital encounter of 01/20/20 (from the past 24 hour(s))  Urinalysis, Routine w reflex microscopic     Status: Abnormal   Collection Time: 01/20/20 10:34 AM  Result Value Ref Range   Color, Urine AMBER (A) YELLOW   APPearance CLEAR CLEAR   Specific Gravity, Urine 1.029 1.005 - 1.030   pH 6.0 5.0 - 8.0   Glucose, UA NEGATIVE NEGATIVE mg/dL   Hgb urine dipstick NEGATIVE NEGATIVE   Bilirubin Urine NEGATIVE NEGATIVE   Ketones, ur NEGATIVE NEGATIVE mg/dL   Protein, ur 30 (A) NEGATIVE mg/dL   Nitrite NEGATIVE NEGATIVE   Leukocytes,Ua NEGATIVE NEGATIVE   RBC / HPF 0-5 0 - 5 RBC/hpf   WBC, UA 0-5 0 - 5 WBC/hpf   Bacteria, UA RARE (A) NONE SEEN   Squamous Epithelial / LPF 0-5 0 - 5   Mucus PRESENT     Imaging:  No results found.  MAU Course: Orders Placed This Encounter  Procedures  . Urinalysis, Routine w reflex microscopic  . Discharge patient   No  orders of the defined types were placed in this encounter.   MDM: Reactive fetal tracing  No regular contractions on monitor. No abnormal discharge on exam. Cervix closed/thick.   U/a negative for signs of infection  Assessment: 1. Braxton Hicks contractions   2. [redacted] weeks gestation of pregnancy     Plan: Discharge home in stable condition.  PTL precautions    Allergies as of 01/20/2020  Reactions   Percocet [oxycodone-acetaminophen] Hives, Itching, Nausea Only   Pineapple Itching   Fresh pineapples; pineapples from a can is okay      Medication List    STOP taking these medications   promethazine 25 MG tablet Commonly known as: PHENERGAN     TAKE these medications   albuterol 108 (90 Base) MCG/ACT inhaler Commonly known as: VENTOLIN HFA Inhale 2 puffs into the lungs every 6 (six) hours as needed for wheezing or shortness of breath.   aspirin EC 81 MG tablet Take 81 mg by mouth daily.   Cetirizine HCl 10 MG Caps Take 1 capsule (10 mg total) by mouth daily for 10 days.   cyclobenzaprine 10 MG tablet Commonly known as: FLEXERIL Take 1 tablet (10 mg total) by mouth daily as needed for muscle spasms (cramping).   ferrous sulfate 325 (65 FE) MG tablet Take 1 tablet (325 mg total) by mouth daily.   labetalol 200 MG tablet Commonly known as: NORMODYNE Take 1 tablet (200 mg total) by mouth 2 (two) times daily.   prenatal multivitamin Tabs tablet Take 1 tablet by mouth daily at 12 noon.   progesterone 200 MG Supp Place 200 mg vaginally at bedtime.   progesterone 100 MG vaginal insert Commonly known as: ENDOMETRIN Place 100 mg vaginally 2 (two) times daily.   PROGESTERONE PO Take by mouth.       Jorje Guild, NP 01/20/2020 11:39 AM

## 2020-01-20 NOTE — Discharge Instructions (Signed)
Braxton Hicks Contractions °Contractions of the uterus can occur throughout pregnancy, but they are not always a sign that you are in labor. You may have practice contractions called Braxton Hicks contractions. These false labor contractions are sometimes confused with true labor. °What are Braxton Hicks contractions? °Braxton Hicks contractions are tightening movements that occur in the muscles of the uterus before labor. Unlike true labor contractions, these contractions do not result in opening (dilation) and thinning of the cervix. Toward the end of pregnancy (32-34 weeks), Braxton Hicks contractions can happen more often and may become stronger. These contractions are sometimes difficult to tell apart from true labor because they can be very uncomfortable. You should not feel embarrassed if you go to the hospital with false labor. °Sometimes, the only way to tell if you are in true labor is for your health care provider to look for changes in the cervix. The health care provider will do a physical exam and may monitor your contractions. If you are not in true labor, the exam should show that your cervix is not dilating and your water has not broken. °If there are no other health problems associated with your pregnancy, it is completely safe for you to be sent home with false labor. You may continue to have Braxton Hicks contractions until you go into true labor. °How to tell the difference between true labor and false labor °True labor °· Contractions last 30-70 seconds. °· Contractions become very regular. °· Discomfort is usually felt in the top of the uterus, and it spreads to the lower abdomen and low back. °· Contractions do not go away with walking. °· Contractions usually become more intense and increase in frequency. °· The cervix dilates and gets thinner. °False labor °· Contractions are usually shorter and not as strong as true labor contractions. °· Contractions are usually irregular. °· Contractions  are often felt in the front of the lower abdomen and in the groin. °· Contractions may go away when you walk around or change positions while lying down. °· Contractions get weaker and are shorter-lasting as time goes on. °· The cervix usually does not dilate or become thin. °Follow these instructions at home: ° °· Take over-the-counter and prescription medicines only as told by your health care provider. °· Keep up with your usual exercises and follow other instructions from your health care provider. °· Eat and drink lightly if you think you are going into labor. °· If Braxton Hicks contractions are making you uncomfortable: °? Change your position from lying down or resting to walking, or change from walking to resting. °? Sit and rest in a tub of warm water. °? Drink enough fluid to keep your urine pale yellow. Dehydration may cause these contractions. °? Do slow and deep breathing several times an hour. °· Keep all follow-up prenatal visits as told by your health care provider. This is important. °Contact a health care provider if: °· You have a fever. °· You have continuous pain in your abdomen. °Get help right away if: °· Your contractions become stronger, more regular, and closer together. °· You have fluid leaking or gushing from your vagina. °· You pass blood-tinged mucus (bloody show). °· You have bleeding from your vagina. °· You have low back pain that you never had before. °· You feel your baby’s head pushing down and causing pelvic pressure. °· Your baby is not moving inside you as much as it used to. °Summary °· Contractions that occur before labor are   called Braxton Hicks contractions, false labor, or practice contractions. °· Braxton Hicks contractions are usually shorter, weaker, farther apart, and less regular than true labor contractions. True labor contractions usually become progressively stronger and regular, and they become more frequent. °· Manage discomfort from Braxton Hicks contractions  by changing position, resting in a warm bath, drinking plenty of water, or practicing deep breathing. °This information is not intended to replace advice given to you by your health care provider. Make sure you discuss any questions you have with your health care provider. °Document Revised: 07/28/2017 Document Reviewed: 12/29/2016 °Elsevier Patient Education © 2020 [employer] ° °

## 2020-01-23 ENCOUNTER — Ambulatory Visit: Payer: BC Managed Care – PPO | Admitting: *Deleted

## 2020-01-23 ENCOUNTER — Ambulatory Visit: Payer: BC Managed Care – PPO

## 2020-01-23 ENCOUNTER — Encounter: Payer: Self-pay | Admitting: *Deleted

## 2020-01-23 ENCOUNTER — Ambulatory Visit (INDEPENDENT_AMBULATORY_CARE_PROVIDER_SITE_OTHER): Payer: BC Managed Care – PPO | Admitting: Obstetrics and Gynecology

## 2020-01-23 ENCOUNTER — Other Ambulatory Visit: Payer: Self-pay

## 2020-01-23 ENCOUNTER — Ambulatory Visit: Payer: BC Managed Care – PPO | Attending: Obstetrics and Gynecology

## 2020-01-23 ENCOUNTER — Encounter: Payer: Self-pay | Admitting: Obstetrics and Gynecology

## 2020-01-23 VITALS — BP 127/81 | HR 102 | Wt 303.0 lb

## 2020-01-23 DIAGNOSIS — O10919 Unspecified pre-existing hypertension complicating pregnancy, unspecified trimester: Secondary | ICD-10-CM | POA: Diagnosis not present

## 2020-01-23 DIAGNOSIS — Z3480 Encounter for supervision of other normal pregnancy, unspecified trimester: Secondary | ICD-10-CM

## 2020-01-23 DIAGNOSIS — Z8679 Personal history of other diseases of the circulatory system: Secondary | ICD-10-CM | POA: Insufficient documentation

## 2020-01-23 DIAGNOSIS — O34219 Maternal care for unspecified type scar from previous cesarean delivery: Secondary | ICD-10-CM

## 2020-01-23 DIAGNOSIS — Z3A33 33 weeks gestation of pregnancy: Secondary | ICD-10-CM

## 2020-01-23 DIAGNOSIS — Z3A35 35 weeks gestation of pregnancy: Secondary | ICD-10-CM | POA: Diagnosis not present

## 2020-01-23 DIAGNOSIS — O99013 Anemia complicating pregnancy, third trimester: Secondary | ICD-10-CM

## 2020-01-23 DIAGNOSIS — O99210 Obesity complicating pregnancy, unspecified trimester: Secondary | ICD-10-CM

## 2020-01-23 DIAGNOSIS — O10913 Unspecified pre-existing hypertension complicating pregnancy, third trimester: Secondary | ICD-10-CM

## 2020-01-23 DIAGNOSIS — O10013 Pre-existing essential hypertension complicating pregnancy, third trimester: Secondary | ICD-10-CM

## 2020-01-23 MED ORDER — LABETALOL HCL 200 MG PO TABS: 200.0000 mg | ORAL_TABLET | Freq: Three times a day (TID) | ORAL | 3 refills | Status: DC

## 2020-01-23 NOTE — Patient Instructions (Signed)
Third Trimester of Pregnancy The third trimester is from week 28 through week 40 (months 7 through 9). The third trimester is a time when the unborn baby (fetus) is growing rapidly. At the end of the ninth month, the fetus is about 20 inches in length and weighs 6-10 pounds. Body changes during your third trimester Your body will continue to go through many changes during pregnancy. The changes vary from woman to woman. During the third trimester:  Your weight will continue to increase. You can expect to gain 25-35 pounds (11-16 kg) by the end of the pregnancy.  You may begin to get stretch marks on your hips, abdomen, and breasts.  You may urinate more often because the fetus is moving lower into your pelvis and pressing on your bladder.  You may develop or continue to have heartburn. This is caused by increased hormones that slow down muscles in the digestive tract.  You may develop or continue to have constipation because increased hormones slow digestion and cause the muscles that push waste through your intestines to relax.  You may develop hemorrhoids. These are swollen veins (varicose veins) in the rectum that can itch or be painful.  You may develop swollen, bulging veins (varicose veins) in your legs.  You may have increased body aches in the pelvis, back, or thighs. This is due to weight gain and increased hormones that are relaxing your joints.  You may have changes in your hair. These can include thickening of your hair, rapid growth, and changes in texture. Some women also have hair loss during or after pregnancy, or hair that feels dry or thin. Your hair will most likely return to normal after your baby is born.  Your breasts will continue to grow and they will continue to become tender. A yellow fluid (colostrum) may leak from your breasts. This is the first milk you are producing for your baby.  Your belly button may stick out.  You may notice more swelling in your hands,  face, or ankles.  You may have increased tingling or numbness in your hands, arms, and legs. The skin on your belly may also feel numb.  You may feel short of breath because of your expanding uterus.  You may have more problems sleeping. This can be caused by the size of your belly, increased need to urinate, and an increase in your body's metabolism.  You may notice the fetus "dropping," or moving lower in your abdomen (lightening).  You may have increased vaginal discharge.  You may notice your joints feel loose and you may have pain around your pelvic bone. What to expect at prenatal visits You will have prenatal exams every 2 weeks until week 36. Then you will have weekly prenatal exams. During a routine prenatal visit:  You will be weighed to make sure you and the baby are growing normally.  Your blood pressure will be taken.  Your abdomen will be measured to track your baby's growth.  The fetal heartbeat will be listened to.  Any test results from the previous visit will be discussed.  You may have a cervical check near your due date to see if your cervix has softened or thinned (effaced).  You will be tested for Group B streptococcus. This happens between 35 and 37 weeks. Your health care provider may ask you:  What your birth plan is.  How you are feeling.  If you are feeling the baby move.  If you have had any abnormal   symptoms, such as leaking fluid, bleeding, severe headaches, or abdominal cramping.  If you are using any tobacco products, including cigarettes, chewing tobacco, and electronic cigarettes.  If you have any questions. Other tests or screenings that may be performed during your third trimester include:  Blood tests that check for low iron levels (anemia).  Fetal testing to check the health, activity level, and growth of the fetus. Testing is done if you have certain medical conditions or if there are problems during the pregnancy.  Nonstress test  (NST). This test checks the health of your baby to make sure there are no signs of problems, such as the baby not getting enough oxygen. During this test, a belt is placed around your belly. The baby is made to move, and its heart rate is monitored during movement. What is false labor? False labor is a condition in which you feel small, irregular tightenings of the muscles in the womb (contractions) that usually go away with rest, changing position, or drinking water. These are called Braxton Hicks contractions. Contractions may last for hours, days, or even weeks before true labor sets in. If contractions come at regular intervals, become more frequent, increase in intensity, or become painful, you should see your health care provider. What are the signs of labor?  Abdominal cramps.  Regular contractions that start at 10 minutes apart and become stronger and more frequent with time.  Contractions that start on the top of the uterus and spread down to the lower abdomen and back.  Increased pelvic pressure and dull back pain.  A watery or bloody mucus discharge that comes from the vagina.  Leaking of amniotic fluid. This is also known as your "water breaking." It could be a slow trickle or a gush. Let your health care provider know if it has a color or strange odor. If you have any of these signs, call your health care provider right away, even if it is before your due date. Follow these instructions at home: Medicines  Follow your health care provider's instructions regarding medicine use. Specific medicines may be either safe or unsafe to take during pregnancy.  Take a prenatal vitamin that contains at least 600 micrograms (mcg) of folic acid.  If you develop constipation, try taking a stool softener if your health care provider approves. Eating and drinking   Eat a balanced diet that includes fresh fruits and vegetables, whole grains, good sources of protein such as meat, eggs, or tofu,  and low-fat dairy. Your health care provider will help you determine the amount of weight gain that is right for you.  Avoid raw meat and uncooked cheese. These carry germs that can cause birth defects in the baby.  If you have low calcium intake from food, talk to your health care provider about whether you should take a daily calcium supplement.  Eat four or five small meals rather than three large meals a day.  Limit foods that are high in fat and processed sugars, such as fried and sweet foods.  To prevent constipation: ? Drink enough fluid to keep your urine clear or pale yellow. ? Eat foods that are high in fiber, such as fresh fruits and vegetables, whole grains, and beans. Activity  Exercise only as directed by your health care provider. Most women can continue their usual exercise routine during pregnancy. Try to exercise for 30 minutes at least 5 days a week. Stop exercising if you experience uterine contractions.  Avoid heavy lifting.  Do   not exercise in extreme heat or humidity, or at high altitudes.  Wear low-heel, comfortable shoes.  Practice good posture.  You may continue to have sex unless your health care provider tells you otherwise. Relieving pain and discomfort  Take frequent breaks and rest with your legs elevated if you have leg cramps or low back pain.  Take warm sitz baths to soothe any pain or discomfort caused by hemorrhoids. Use hemorrhoid cream if your health care provider approves.  Wear a good support bra to prevent discomfort from breast tenderness.  If you develop varicose veins: ? Wear support pantyhose or compression stockings as told by your healthcare provider. ? Elevate your feet for 15 minutes, 3-4 times a day. Prenatal care  Write down your questions. Take them to your prenatal visits.  Keep all your prenatal visits as told by your health care provider. This is important. Safety  Wear your seat belt at all times when driving.  Make  a list of emergency phone numbers, including numbers for family, friends, the hospital, and police and fire departments. General instructions  Avoid cat litter boxes and soil used by cats. These carry germs that can cause birth defects in the baby. If you have a cat, ask someone to clean the litter box for you.  Do not travel far distances unless it is absolutely necessary and only with the approval of your health care provider.  Do not use hot tubs, steam rooms, or saunas.  Do not drink alcohol.  Do not use any products that contain nicotine or tobacco, such as cigarettes and e-cigarettes. If you need help quitting, ask your health care provider.  Do not use any medicinal herbs or unprescribed drugs. These chemicals affect the formation and growth of the baby.  Do not douche or use tampons or scented sanitary pads.  Do not cross your legs for long periods of time.  To prepare for the arrival of your baby: ? Take prenatal classes to understand, practice, and ask questions about labor and delivery. ? Make a trial run to the hospital. ? Visit the hospital and tour the maternity area. ? Arrange for maternity or paternity leave through employers. ? Arrange for family and friends to take care of pets while you are in the hospital. ? Purchase a rear-facing car seat and make sure you know how to install it in your car. ? Pack your hospital bag. ? Prepare the baby's nursery. Make sure to remove all pillows and stuffed animals from the baby's crib to prevent suffocation.  Visit your dentist if you have not gone during your pregnancy. Use a soft toothbrush to brush your teeth and be gentle when you floss. Contact a health care provider if:  You are unsure if you are in labor or if your water has broken.  You become dizzy.  You have mild pelvic cramps, pelvic pressure, or nagging pain in your abdominal area.  You have lower back pain.  You have persistent nausea, vomiting, or  diarrhea.  You have an unusual or bad smelling vaginal discharge.  You have pain when you urinate. Get help right away if:  Your water breaks before 37 weeks.  You have regular contractions less than 5 minutes apart before 37 weeks.  You have a fever.  You are leaking fluid from your vagina.  You have spotting or bleeding from your vagina.  You have severe abdominal pain or cramping.  You have rapid weight loss or weight gain.  You have   shortness of breath with chest pain.  You notice sudden or extreme swelling of your face, hands, ankles, feet, or legs.  Your baby makes fewer than 10 movements in 2 hours.  You have severe headaches that do not go away when you take medicine.  You have vision changes. Summary  The third trimester is from week 28 through week 40, months 7 through 9. The third trimester is a time when the unborn baby (fetus) is growing rapidly.  During the third trimester, your discomfort may increase as you and your baby continue to gain weight. You may have abdominal, leg, and back pain, sleeping problems, and an increased need to urinate.  During the third trimester your breasts will keep growing and they will continue to become tender. A yellow fluid (colostrum) may leak from your breasts. This is the first milk you are producing for your baby.  False labor is a condition in which you feel small, irregular tightenings of the muscles in the womb (contractions) that eventually go away. These are called Braxton Hicks contractions. Contractions may last for hours, days, or even weeks before true labor sets in.  Signs of labor can include: abdominal cramps; regular contractions that start at 10 minutes apart and become stronger and more frequent with time; watery or bloody mucus discharge that comes from the vagina; increased pelvic pressure and dull back pain; and leaking of amniotic fluid. This information is not intended to replace advice given to you by your  health care provider. Make sure you discuss any questions you have with your health care provider. Document Revised: 12/06/2018 Document Reviewed: 09/20/2016 Elsevier Patient Education  2020 [employer]  

## 2020-01-23 NOTE — Progress Notes (Signed)
Decreased fetal movement and elevated bp starting yesterday pt stated that bp meds aren't working for her bp seemed to be ok after taking med but was very high this am before coming
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
## 2020-01-23 NOTE — Progress Notes (Signed)
Subjective:  Julie King is a 29 y.o. G2P1001 at [redacted]w[redacted]d being seen today for ongoing prenatal care.  She is currently monitored for the following issues for this high-risk pregnancy and has Headache; Morbidly obese ([place]); Supervision of other normal pregnancy, antepartum; Anemia in pregnancy, third trimester; Previous cesarean delivery affecting pregnancy; Chronic hypertension affecting pregnancy; History of atrial fibrillation; and Maternal obesity affecting pregnancy, antepartum on their problem list.  Patient reports elevated BP the other day at home. Denies HA or visual changes today..  Contractions: Irregular. Vag. Bleeding: Scant.  Movement: Present. Denies leaking of fluid.   The following portions of the patient's history were reviewed and updated as appropriate: allergies, current medications, past family history, past medical history, past social history, past surgical history and problem list. Problem list updated.  Objective:   Vitals:   01/23/20 0823  BP: 127/81  Pulse: (!) 102  Weight: (!) 303 lb (137.4 kg)    Fetal Status: Fetal Heart Rate (bpm): 155   Movement: Present     General:  Alert, oriented and cooperative. Patient is in no acute distress.  Skin: Skin is warm and dry. No rash noted.   Cardiovascular: Normal heart rate noted  Respiratory: Normal respiratory effort, no problems with respiration noted  Abdomen: Soft, gravid, appropriate for gestational age. Pain/Pressure: Present     Pelvic:  Cervical exam performed        Extremities: Normal range of motion.  Edema: Trace  Mental Status: Normal mood and affect. Normal behavior. Normal judgment and thought content.   Urinalysis:      Assessment and Plan:  Pregnancy: G2P1001 at [redacted]w[redacted]d  1. Supervision of other normal pregnancy, antepartum Stable GBS next visit  2. Chronic hypertension affecting pregnancy BP at home 130-140/90-100's Previously 120-130's/80's Will increase Labetalol to 200 tid BPP today Pt  instructed to continue to monitor her BP at home Will reevaluate response to increase in Labetalol at next OB visit Schedule IOL at that visit as well - labetalol (NORMODYNE) 200 MG tablet; Take 1 tablet (200 mg total) by mouth 3 (three) times daily.  Dispense: 90 tablet; Refill: 3  3. Previous cesarean delivery affecting pregnancy For TOLAC, consent already signed  Preterm labor symptoms and general obstetric precautions including but not limited to vaginal bleeding, contractions, leaking of fluid and fetal movement were reviewed in detail with the patient. Please refer to After Visit Summary for other counseling recommendations.  Return in about 1 week (around 01/30/2020) for face to face, MD only, OB visit.   Chancy Milroy, MD

## 2020-01-24 ENCOUNTER — Telehealth: Payer: Self-pay | Admitting: *Deleted

## 2020-01-24 DIAGNOSIS — Z3480 Encounter for supervision of other normal pregnancy, unspecified trimester: Secondary | ICD-10-CM

## 2020-01-24 NOTE — Telephone Encounter (Signed)
Pt called to office, LM stating her BP today is 142/91. Pt states that she has taken her first dose of medication.    Return call to pt. Pt states her retake is 139/81. Pt denies any symptoms at this time.  Pt advised to continue medication as prescribed and Monitor BP. If BP remains elevated and/or she becomes symptomatic she should be seen at the hospital for eval.  Pt states understanding.

## 2020-01-25 ENCOUNTER — Inpatient Hospital Stay (HOSPITAL_BASED_OUTPATIENT_CLINIC_OR_DEPARTMENT_OTHER): Payer: BC Managed Care – PPO

## 2020-01-25 ENCOUNTER — Inpatient Hospital Stay (HOSPITAL_COMMUNITY)
Admission: AD | Admit: 2020-01-25 | Discharge: 2020-01-25 | Disposition: A | Discharge status: Home / Self Care | Payer: BC Managed Care – PPO | Attending: Obstetrics and Gynecology | Admitting: Obstetrics and Gynecology

## 2020-01-25 ENCOUNTER — Encounter (HOSPITAL_COMMUNITY): Payer: Self-pay | Admitting: Obstetrics and Gynecology

## 2020-01-25 ENCOUNTER — Other Ambulatory Visit: Payer: Self-pay

## 2020-01-25 DIAGNOSIS — Z3689 Encounter for other specified antenatal screening: Secondary | ICD-10-CM | POA: Diagnosis not present

## 2020-01-25 DIAGNOSIS — N898 Other specified noninflammatory disorders of vagina: Secondary | ICD-10-CM

## 2020-01-25 DIAGNOSIS — O368190 Decreased fetal movements, unspecified trimester, not applicable or unspecified: Secondary | ICD-10-CM

## 2020-01-25 DIAGNOSIS — O99210 Obesity complicating pregnancy, unspecified trimester: Secondary | ICD-10-CM

## 2020-01-25 DIAGNOSIS — Z3A35 35 weeks gestation of pregnancy: Secondary | ICD-10-CM

## 2020-01-25 DIAGNOSIS — O368130 Decreased fetal movements, third trimester, not applicable or unspecified: Secondary | ICD-10-CM

## 2020-01-25 DIAGNOSIS — Z8349 Family history of other endocrine, nutritional and metabolic diseases: Secondary | ICD-10-CM | POA: Diagnosis not present

## 2020-01-25 DIAGNOSIS — Z8249 Family history of ischemic heart disease and other diseases of the circulatory system: Secondary | ICD-10-CM | POA: Insufficient documentation

## 2020-01-25 DIAGNOSIS — Z7982 Long term (current) use of aspirin: Secondary | ICD-10-CM | POA: Insufficient documentation

## 2020-01-25 DIAGNOSIS — O99213 Obesity complicating pregnancy, third trimester: Secondary | ICD-10-CM | POA: Insufficient documentation

## 2020-01-25 DIAGNOSIS — Z87891 Personal history of nicotine dependence: Secondary | ICD-10-CM | POA: Insufficient documentation

## 2020-01-25 DIAGNOSIS — O10919 Unspecified pre-existing hypertension complicating pregnancy, unspecified trimester: Secondary | ICD-10-CM

## 2020-01-25 DIAGNOSIS — Z79899 Other long term (current) drug therapy: Secondary | ICD-10-CM | POA: Insufficient documentation

## 2020-01-25 DIAGNOSIS — Z885 Allergy status to narcotic agent status: Secondary | ICD-10-CM | POA: Diagnosis not present

## 2020-01-25 DIAGNOSIS — O26893 Other specified pregnancy related conditions, third trimester: Secondary | ICD-10-CM | POA: Diagnosis not present

## 2020-01-25 DIAGNOSIS — Z833 Family history of diabetes mellitus: Secondary | ICD-10-CM | POA: Diagnosis not present

## 2020-01-25 DIAGNOSIS — O34219 Maternal care for unspecified type scar from previous cesarean delivery: Secondary | ICD-10-CM

## 2020-01-25 DIAGNOSIS — Z8379 Family history of other diseases of the digestive system: Secondary | ICD-10-CM | POA: Diagnosis not present

## 2020-01-25 DIAGNOSIS — O99013 Anemia complicating pregnancy, third trimester: Secondary | ICD-10-CM

## 2020-01-25 DIAGNOSIS — Z8679 Personal history of other diseases of the circulatory system: Secondary | ICD-10-CM

## 2020-01-25 LAB — POCT FERN TEST: POCT Fern Test: NEGATIVE

## 2020-01-25 LAB — AMNISURE RUPTURE OF MEMBRANE (ROM) NOT AT ARMC: Amnisure ROM: NEGATIVE

## 2020-01-25 NOTE — MAU Provider Note (Signed)
History   [id number]   Chief Complaint  Patient presents with  . Decreased Fetal Movement  . Rupture of Membranes    HPI Julie King is a 29 y.o. female  G2P1001 @35 .4 wks here with report of leaking clear fluid x3 today.  Leaking of fluid has continued. Pt reports no contractions. She denies vaginal bleeding. Last intercourse was not recent. She reports + fetal movement but less today. All other systems negative.    Patient's last menstrual period was 06/02/2019.  OB History  Gravida Para Term Preterm AB Living  2 1 1     1   SAB TAB Ectopic Multiple Live Births          1    # Outcome Date GA Lbr Len/2nd Weight Sex Delivery Anes PTL Lv  2 Current           1 Term 2013    F CS-LTranv   LIV    Past Medical History:  Diagnosis Date  . Atrial fibrillation ([place])   . Headache 07/25/2017  . Hypertension   . Irritable bowel syndrome   . Morbidly obese ([place]) 07/25/2017  . QT prolongation   . T wave inversion in EKG     Family History  Problem Relation Age of Onset  . Hypertension Other   . Diabetes Other   . Crohn's disease Mother   . Hypertension Mother   . Thyroid disease Father   . Diabetes Father   . Breast cancer Maternal Aunt        late 26 early 59s  . Breast cancer Maternal Grandmother        late 60s early 12    Social History   Socioeconomic History  . Marital status: Single    Spouse name: Not on file  . Number of children: Not on file  . Years of education: Not on file  . Highest education level: Not on file  Occupational History  . Occupation: Barrister's clerk apartment  Tobacco Use  . Smoking status: Former Smoker    Packs/day: 0.50    Years: 10.00    Pack years: 5.00    Types: Cigarettes  . Smokeless tobacco: Never Used  . Tobacco comment: wants to quit but states very difficult Jan 2020  Substance and Sexual Activity  . Alcohol use: Not Currently  . Drug use: No  . Sexual activity: Not Currently    Comment: with female  Other  Topics Concern  . Not on file  Social History Narrative   Lives with partner   Caffeine use: Coffee daily   Soda sometimes   Right handed    Has one daughter   Social Determinants of Health   Financial Resource Strain:   . Difficulty of Paying Living Expenses:   Food Insecurity:   . Worried About Charity fundraiser in the Last Year:   . Arboriculturist in the Last Year:   Transportation Needs:   . Film/video editor (Medical):   Marland Kitchen Lack of Transportation (Non-Medical):   Physical Activity:   . Days of Exercise per Week:   . Minutes of Exercise per Session:   Stress:   . Feeling of Stress :   Social Connections:   . Frequency of Communication with Friends and Family:   . Frequency of Social Gatherings with Friends and Family:   . Attends Religious Services:   . Active Member of Clubs or Organizations:   . Attends Archivist  Meetings:   Marland Kitchen Marital Status:     Allergies  Allergen Reactions  . Percocet [Oxycodone-Acetaminophen] Hives, Itching and Nausea Only  . Pineapple Itching    Fresh pineapples; pineapples from a can is okay    No current facility-administered medications on file prior to encounter.   Current Outpatient Medications on File Prior to Encounter  Medication Sig Dispense Refill  . albuterol (PROVENTIL HFA;VENTOLIN HFA) 108 (90 Base) MCG/ACT inhaler Inhale 2 puffs into the lungs every 6 (six) hours as needed for wheezing or shortness of breath. 1 Inhaler 5  . aspirin EC 81 MG tablet Take 81 mg by mouth daily.    . Cetirizine HCl 10 MG CAPS Take 1 capsule (10 mg total) by mouth daily for 10 days. 10 capsule 0  . cyclobenzaprine (FLEXERIL) 10 MG tablet Take 1 tablet (10 mg total) by mouth daily as needed for muscle spasms (cramping). 30 tablet 0  . ferrous sulfate 325 (65 FE) MG tablet Take 1 tablet (325 mg total) by mouth daily. 30 tablet 3  . labetalol (NORMODYNE) 200 MG tablet Take 1 tablet (200 mg total) by mouth 3 (three) times daily. 90  tablet 3  . Prenatal Vit-Fe Fumarate-FA (PRENATAL MULTIVITAMIN) TABS tablet Take 1 tablet by mouth daily at 12 noon.       Review of Systems  Gastrointestinal: Negative for abdominal pain.  Genitourinary: Positive for vaginal discharge. Negative for vaginal bleeding.     Physical Exam   Vitals:   01/25/20 1038 01/25/20 1047  BP:  118/60  Pulse:  (!) 102  Resp:  16  Temp:  98.8 F (37.1 C)  TempSrc:  Oral  SpO2:  98%  Weight: (!) 137.7 kg   Height: 5\' 7"  (1.702 m)     Physical Exam  Nursing note and vitals reviewed. Constitutional: She is oriented to person, place, and time. She appears well-developed and well-nourished. No distress.  HENT:  Head: Normocephalic and atraumatic.  Cardiovascular: Normal rate.  Respiratory: Effort normal. No respiratory distress.  GI: Soft. She exhibits no distension. There is no abdominal tenderness.  gravid  Genitourinary:    Genitourinary Comments: SSE: no pool, fern neg; thick white discharge   Musculoskeletal:        General: Normal range of motion.     Cervical back: Normal range of motion.  Neurological: She is alert and oriented to person, place, and time.  Skin: Skin is warm and dry.  Psychiatric: She has a normal mood and affect.  EFM: 125 bpm, mod variability, + accels, no decels Toco: rare  Results for orders placed or performed during the hospital encounter of 01/25/20 (from the past 24 hour(s))  Amnisure rupture of membrane (rom)not at [facility]     Status: None   Collection Time: 01/25/20 11:45 AM  Result Value Ref Range   Amnisure ROM NEGATIVE   POCT fern test     Status: None   Collection Time: 01/25/20 11:45 AM  Result Value Ref Range   POCT Fern Test Negative = intact amniotic membranes    MAU Course  Procedures  MDM Labs ordered and reviewed. No evidence of SROM. Only felt 3 FMs in almost 2 hrs while on EFM, NST reactive but will order BPP. BPP 8/8>10/10, AFI 15.5. Pt feeling good FM since [place], reassured on fetal  status. Stable for discharge home.   Assessment and Plan  [redacted] weeks gestation Reactive NST Vaginal discharge in pregnancy Discharge home Follow up at [facility] as scheduled PTL  precautions FMCs  Allergies as of 01/25/2020      Reactions   Percocet [oxycodone-acetaminophen] Hives, Itching, Nausea Only   Pineapple Itching   Fresh pineapples; pineapples from a can is okay      Medication List    TAKE these medications   albuterol 108 (90 Base) MCG/ACT inhaler Commonly known as: VENTOLIN HFA Inhale 2 puffs into the lungs every 6 (six) hours as needed for wheezing or shortness of breath.   aspirin EC 81 MG tablet Take 81 mg by mouth daily.   Cetirizine HCl 10 MG Caps Take 1 capsule (10 mg total) by mouth daily for 10 days.   cyclobenzaprine 10 MG tablet Commonly known as: FLEXERIL Take 1 tablet (10 mg total) by mouth daily as needed for muscle spasms (cramping).   ferrous sulfate 325 (65 FE) MG tablet Take 1 tablet (325 mg total) by mouth daily.   labetalol 200 MG tablet Commonly known as: NORMODYNE Take 1 tablet (200 mg total) by mouth 3 (three) times daily.   prenatal multivitamin Tabs tablet Take 1 tablet by mouth daily at 12 noon.      Julianne Handler, CNM 01/25/2020 11:28 AM

## 2020-01-25 NOTE — MAU Note (Signed)
Julie King is a 29 y.o. at [redacted]w[redacted]d here in MAU reporting: some LOF this AM, has been clear. Felt the fluid down towards her bottom and is still feeling leaking. States this happened a few weeks ago and they said her fluid was a little low on u/s. Denies pain or bleeding. Some DFM, felt a little bit earlier.   Onset of complaint: today  Pain score: 0/10  Vitals:   01/25/20 1047  BP: 118/60  Pulse: (!) 102  Resp: 16  Temp: 98.8 F (37.1 C)  SpO2: 98%     FHT: 133  Lab orders placed from triage: none

## 2020-01-25 NOTE — Discharge Instructions (Signed)
Fetal Movement Counts Patient Name: ________________________________________________ Patient Due Date: ____________________ What is a fetal movement count?  A fetal movement count is the number of times that you feel your baby move during a certain amount of time. This may also be called a fetal kick count. A fetal movement count is recommended for every pregnant woman. You may be asked to start counting fetal movements as early as week 28 of your pregnancy. Pay attention to when your baby is most active. You may notice your baby's sleep and wake cycles. You may also notice things that make your baby move more. You should do a fetal movement count:  When your baby is normally most active.  At the same time each day. A good time to count movements is while you are resting, after having something to eat and drink. How do I count fetal movements? 1. Find a quiet, comfortable area. Sit, or lie down on your side. 2. Write down the date, the start time and stop time, and the number of movements that you felt between those two times. Take this information with you to your health care visits. 3. Write down your start time when you feel the first movement. 4. Count kicks, flutters, swishes, rolls, and jabs. You should feel at least 10 movements. 5. You may stop counting after you have felt 10 movements, or if you have been counting for 2 hours. Write down the stop time. 6. If you do not feel 10 movements in 2 hours, contact your health care provider for further instructions. Your health care provider may want to do additional tests to assess your baby's well-being. Contact a health care provider if:  You feel fewer than 10 movements in 2 hours.  Your baby is not moving like he or she usually does. Date: ____________ Start time: ____________ Stop time: ____________ Movements: ____________ Date: ____________ Start time: ____________ Stop time: ____________ Movements: ____________ Date: ____________  Start time: ____________ Stop time: ____________ Movements: ____________ Date: ____________ Start time: ____________ Stop time: ____________ Movements: ____________ Date: ____________ Start time: ____________ Stop time: ____________ Movements: ____________ Date: ____________ Start time: ____________ Stop time: ____________ Movements: ____________ Date: ____________ Start time: ____________ Stop time: ____________ Movements: ____________ Date: ____________ Start time: ____________ Stop time: ____________ Movements: ____________ Date: ____________ Start time: ____________ Stop time: ____________ Movements: ____________ This information is not intended to replace advice given to you by your health care provider. Make sure you discuss any questions you have with your health care provider. Document Revised: 04/04/2019 Document Reviewed: 04/04/2019 Elsevier Patient Education  2020 Elsevier Inc. Braxton Hicks Contractions Contractions of the uterus can occur throughout pregnancy, but they are not always a sign that you are in labor. You may have practice contractions called Braxton Hicks contractions. These false labor contractions are sometimes confused with true labor. What are Braxton Hicks contractions? Braxton Hicks contractions are tightening movements that occur in the muscles of the uterus before labor. Unlike true labor contractions, these contractions do not result in opening (dilation) and thinning of the cervix. Toward the end of pregnancy (32-34 weeks), Braxton Hicks contractions can happen more often and may become stronger. These contractions are sometimes difficult to tell apart from true labor because they can be very uncomfortable. You should not feel embarrassed if you go to the hospital with false labor. Sometimes, the only way to tell if you are in true labor is for your health care provider to look for changes in the cervix. The health care provider   will do a physical exam and may  monitor your contractions. If you are not in true labor, the exam should show that your cervix is not dilating and your water has not broken. If there are no other health problems associated with your pregnancy, it is completely safe for you to be sent home with false labor. You may continue to have Braxton Hicks contractions until you go into true labor. How to tell the difference between true labor and false labor True labor  Contractions last 30-70 seconds.  Contractions become very regular.  Discomfort is usually felt in the top of the uterus, and it spreads to the lower abdomen and low back.  Contractions do not go away with walking.  Contractions usually become more intense and increase in frequency.  The cervix dilates and gets thinner. False labor  Contractions are usually shorter and not as strong as true labor contractions.  Contractions are usually irregular.  Contractions are often felt in the front of the lower abdomen and in the groin.  Contractions may go away when you walk around or change positions while lying down.  Contractions get weaker and are shorter-lasting as time goes on.  The cervix usually does not dilate or become thin. Follow these instructions at home:   Take over-the-counter and prescription medicines only as told by your health care provider.  Keep up with your usual exercises and follow other instructions from your health care provider.  Eat and drink lightly if you think you are going into labor.  If Braxton Hicks contractions are making you uncomfortable: ? Change your position from lying down or resting to walking, or change from walking to resting. ? Sit and rest in a tub of warm water. ? Drink enough fluid to keep your urine pale yellow. Dehydration may cause these contractions. ? Do slow and deep breathing several times an hour.  Keep all follow-up prenatal visits as told by your health care provider. This is important. Contact a  health care provider if:  You have a fever.  You have continuous pain in your abdomen. Get help right away if:  Your contractions become stronger, more regular, and closer together.  You have fluid leaking or gushing from your vagina.  You pass blood-tinged mucus (bloody show).  You have bleeding from your vagina.  You have low back pain that you never had before.  You feel your baby's head pushing down and causing pelvic pressure.  Your baby is not moving inside you as much as it used to. Summary  Contractions that occur before labor are called Braxton Hicks contractions, false labor, or practice contractions.  Braxton Hicks contractions are usually shorter, weaker, farther apart, and less regular than true labor contractions. True labor contractions usually become progressively stronger and regular, and they become more frequent.  Manage discomfort from Braxton Hicks contractions by changing position, resting in a warm bath, drinking plenty of water, or practicing deep breathing. This information is not intended to replace advice given to you by your health care provider. Make sure you discuss any questions you have with your health care provider. Document Revised: 07/28/2017 Document Reviewed: 12/29/2016 Elsevier Patient Education  2020 Elsevier Inc.  

## 2020-01-30 ENCOUNTER — Inpatient Hospital Stay (EMERGENCY_DEPARTMENT_HOSPITAL)
Admission: AD | Admit: 2020-01-30 | Discharge: 2020-01-30 | Disposition: A | Discharge status: Home / Self Care | Payer: BC Managed Care – PPO | Source: Home / Self Care | Attending: Obstetrics & Gynecology | Admitting: Obstetrics & Gynecology

## 2020-01-30 ENCOUNTER — Encounter: Payer: BC Managed Care – PPO | Admitting: Obstetrics and Gynecology

## 2020-01-30 ENCOUNTER — Encounter (HOSPITAL_COMMUNITY): Payer: Self-pay | Admitting: Obstetrics & Gynecology

## 2020-01-30 ENCOUNTER — Other Ambulatory Visit: Payer: Self-pay | Admitting: Advanced Practice Midwife

## 2020-01-30 ENCOUNTER — Other Ambulatory Visit: Payer: Self-pay

## 2020-01-30 ENCOUNTER — Telehealth: Payer: Self-pay

## 2020-01-30 DIAGNOSIS — O1403 Mild to moderate pre-eclampsia, third trimester: Secondary | ICD-10-CM | POA: Insufficient documentation

## 2020-01-30 DIAGNOSIS — Z3A36 36 weeks gestation of pregnancy: Secondary | ICD-10-CM

## 2020-01-30 DIAGNOSIS — O10913 Unspecified pre-existing hypertension complicating pregnancy, third trimester: Secondary | ICD-10-CM | POA: Insufficient documentation

## 2020-01-30 DIAGNOSIS — O113 Pre-existing hypertension with pre-eclampsia, third trimester: Secondary | ICD-10-CM

## 2020-01-30 DIAGNOSIS — R5383 Other fatigue: Secondary | ICD-10-CM | POA: Insufficient documentation

## 2020-01-30 DIAGNOSIS — O119 Pre-existing hypertension with pre-eclampsia, unspecified trimester: Secondary | ICD-10-CM

## 2020-01-30 DIAGNOSIS — R110 Nausea: Secondary | ICD-10-CM | POA: Insufficient documentation

## 2020-01-30 DIAGNOSIS — O368130 Decreased fetal movements, third trimester, not applicable or unspecified: Secondary | ICD-10-CM | POA: Insufficient documentation

## 2020-01-30 DIAGNOSIS — Z7982 Long term (current) use of aspirin: Secondary | ICD-10-CM | POA: Insufficient documentation

## 2020-01-30 DIAGNOSIS — Z79899 Other long term (current) drug therapy: Secondary | ICD-10-CM | POA: Insufficient documentation

## 2020-01-30 DIAGNOSIS — Z3689 Encounter for other specified antenatal screening: Secondary | ICD-10-CM

## 2020-01-30 DIAGNOSIS — Z885 Allergy status to narcotic agent status: Secondary | ICD-10-CM | POA: Insufficient documentation

## 2020-01-30 LAB — COMPREHENSIVE METABOLIC PANEL
ALT: 19 U/L (ref 0–44)
AST: 20 U/L (ref 15–41)
Albumin: 2.6 g/dL — ABNORMAL LOW (ref 3.5–5.0)
Alkaline Phosphatase: 106 U/L (ref 38–126)
Anion gap: 12 (ref 5–15)
BUN: 5 mg/dL — ABNORMAL LOW (ref 6–20)
CO2: 19 mmol/L — ABNORMAL LOW (ref 22–32)
Calcium: 8.7 mg/dL — ABNORMAL LOW (ref 8.9–10.3)
Chloride: 105 mmol/L (ref 98–111)
Creatinine, Ser: 0.6 mg/dL (ref 0.44–1.00)
GFR calc Af Amer: >60 mL/min (ref >60)
GFR calc non Af Amer: >60 mL/min (ref >60)
Glucose, Bld: 123 mg/dL — ABNORMAL HIGH (ref 70–99)
Potassium: 3.6 mmol/L (ref 3.5–5.1)
Sodium: 136 mmol/L (ref 135–145)
Total Bilirubin: 0.6 mg/dL (ref 0.3–1.2)
Total Protein: 6.5 g/dL (ref 6.5–8.1)

## 2020-01-30 LAB — CBC
HCT: 31.7 % — ABNORMAL LOW (ref 36.0–46.0)
Hemoglobin: 10 g/dL — ABNORMAL LOW (ref 12.0–15.0)
MCH: 19.8 pg — ABNORMAL LOW (ref 26.0–34.0)
MCHC: 31.5 g/dL (ref 30.0–36.0)
MCV: 62.9 fL — ABNORMAL LOW (ref 80.0–100.0)
Platelets: 284 10*3/uL (ref 150–400)
RBC: 5.04 MIL/uL (ref 3.87–5.11)
RDW: 15.5 % (ref 11.5–15.5)
WBC: 11.3 10*3/uL — ABNORMAL HIGH (ref 4.0–10.5)
nRBC: 0.2 % (ref 0.0–0.2)

## 2020-01-30 LAB — PROTEIN / CREATININE RATIO, URINE
Creatinine, Urine: 239.52 mg/dL
Protein Creatinine Ratio: 0.53 mg/mg{Cre} — ABNORMAL HIGH (ref 0.00–0.15)
Total Protein, Urine: 126 mg/dL

## 2020-01-30 MED ORDER — LABETALOL HCL 5 MG/ML IV SOLN: 80.0000 mg | INTRAVENOUS | Status: DC | PRN

## 2020-01-30 MED ORDER — LABETALOL HCL 200 MG PO TABS: 400.0000 mg | ORAL_TABLET | Freq: Two times a day (BID) | ORAL | 3 refills | Status: DC

## 2020-01-30 MED ORDER — HYDRALAZINE HCL 20 MG/ML IJ SOLN: 10.0000 mg | INTRAMUSCULAR | Status: DC | PRN

## 2020-01-30 MED ORDER — LABETALOL HCL 5 MG/ML IV SOLN: 20.0000 mg | INTRAVENOUS | Status: DC | PRN

## 2020-01-30 MED ORDER — LABETALOL HCL 5 MG/ML IV SOLN: 40.0000 mg | INTRAVENOUS | Status: DC | PRN

## 2020-01-30 NOTE — MAU Provider Note (Signed)
History     CSN: [id number]  Arrival date and time: 01/30/20 1010  First Provider Initiated Contact with Patient 01/30/20 1039      Chief Complaint  Patient presents with  . Hypertension  . Fatigue  . Decreased Fetal Movement   HPI Julie King is a 29 y.o. G2P1001 at [redacted]w[redacted]d who presents to MAU for evaluation of severe range blood pressures on her home cuff. She endorses BP of 167/74 last night. She called [facility] this morning and obtained a BP reading of 184/83 while on the phone with clinic staff and was advised to present to MAU.  Patient also reports fatigue, nausea and "feeling not like myself". She denies headache, visual disturbances, RUQ/epigastric pain, new onset swelling or weight gain. She denies vaginal bleeding, leaking of fluid, decreased fetal movement, fever, falls, or recent illness.   Patient is a gestational surrogate. The Intended Parents (IPs) are located in [place] and are engaged in her care.  She receives care with [facility].  OB History    Gravida  2   Para  1   Term  1   Preterm      AB      Living  1     SAB      TAB      Ectopic      Multiple      Live Births  1           Past Medical History:  Diagnosis Date  . Atrial fibrillation ([place])   . Headache 07/25/2017  . Hypertension   . Irritable bowel syndrome   . Morbidly obese ([place]) 07/25/2017  . QT prolongation   . T wave inversion in EKG     Past Surgical History:  Procedure Laterality Date  . CESAREAN SECTION    . CHOLECYSTECTOMY    . HERNIA REPAIR    . TONSILLECTOMY      Family History  Problem Relation Age of Onset  . Hypertension Other   . Diabetes Other   . Crohn's disease Mother   . Hypertension Mother   . Thyroid disease Father   . Diabetes Father   . Breast cancer Maternal Aunt        late 34 early 62s  . Breast cancer Maternal Grandmother        late 46s early 83    Social History   Tobacco Use  . Smoking status: Former Smoker     Packs/day: 0.50    Years: 10.00    Pack years: 5.00    Types: Cigarettes  . Smokeless tobacco: Never Used  . Tobacco comment: wants to quit but states very difficult Jan 2020  Substance Use Topics  . Alcohol use: Not Currently  . Drug use: No    Allergies:  Allergies  Allergen Reactions  . Percocet [Oxycodone-Acetaminophen] Hives, Itching and Nausea Only  . Pineapple Itching    Fresh pineapples; pineapples from a can is okay    Medications Prior to Admission  Medication Sig Dispense Refill Last Dose  . aspirin EC 81 MG tablet Take 81 mg by mouth daily.   01/30/2020 at Unknown time  . cyclobenzaprine (FLEXERIL) 10 MG tablet Take 1 tablet (10 mg total) by mouth daily as needed for muscle spasms (cramping). 30 tablet 0 Past Month at Unknown time  . ferrous sulfate 325 (65 FE) MG tablet Take 1 tablet (325 mg total) by mouth daily. 30 tablet 3 01/30/2020 at Unknown time  .  labetalol (NORMODYNE) 200 MG tablet Take 1 tablet (200 mg total) by mouth 3 (three) times daily. 90 tablet 3 01/30/2020 at Unknown time  . Prenatal Vit-Fe Fumarate-FA (PRENATAL MULTIVITAMIN) TABS tablet Take 1 tablet by mouth daily at 12 noon.   01/30/2020 at Unknown time  . albuterol (PROVENTIL HFA;VENTOLIN HFA) 108 (90 Base) MCG/ACT inhaler Inhale 2 puffs into the lungs every 6 (six) hours as needed for wheezing or shortness of breath. 1 Inhaler 5 More than a month at Unknown time  . Cetirizine HCl 10 MG CAPS Take 1 capsule (10 mg total) by mouth daily for 10 days. 10 capsule 0     Review of Systems  Constitutional: Positive for fatigue. Negative for chills and fever.  Eyes: Negative for photophobia and visual disturbance.  Gastrointestinal: Positive for nausea. Negative for abdominal pain.  Genitourinary: Negative for vaginal discharge.  Musculoskeletal: Negative for back pain.  Neurological: Negative for headaches.  All other systems reviewed and are negative.  Physical Exam   Blood pressure (!) 145/100, pulse  (!) 136, temperature 98.4 F (36.9 C), temperature source Oral, resp. rate 18, last menstrual period 06/02/2019, SpO2 100 %.  Physical Exam  Nursing note and vitals reviewed. Constitutional: She is oriented to person, place, and time. She appears well-developed and well-nourished.  Cardiovascular: Normal rate and normal heart sounds.  Respiratory: Effort normal and breath sounds normal. No respiratory distress. She has no decreased breath sounds.  GI: Soft. She exhibits no distension. There is no abdominal tenderness. There is no rebound, no guarding and no CVA tenderness.  Gravid  Neurological: She is alert and oriented to person, place, and time.  Skin: Skin is warm and dry.  Psychiatric: She has a normal mood and affect. Her behavior is normal. Judgment and thought content normal.    MAU Course/MDM  Procedures  --Reactive tracing: baseline 130, mod variability, positive accels, no decels --Toco: rare contractions --SVE: Closed/thick/anterior --No severe range blood pressures or severe features --P:Cr 0.53: Meets criteria for Superimposed Preeclampsia without Severe Features --15 min spent at bedside discussing criteria for return to MAU --HPI, treatments, lab results reviewed with Dr. Roselie Awkward. Plan made for IOL at 37.0 weeks on 02/04/20. Consistent with MFM guidance from their evaluation 01/23/2020. Desires TOLAC. Orders placed for low dose Pitocin until foley balloon can be placed.  Orders Placed This Encounter  Procedures  . Culture, beta strep (group b only)  . Protein / creatinine ratio, urine  . CBC  . Comprehensive metabolic panel  . Diet NPO time specified  . Measure blood pressure  . Notify Physician  . Measure blood pressure   Patient Vitals for the past 24 hrs:  BP Temp Temp src Pulse Resp SpO2 Weight  01/30/20 1230 121/63 -- -- 100 -- 99 % --  01/30/20 1215 111/67 -- -- 100 -- 99 % --  01/30/20 1200 (!) 104/55 -- -- (!) 106 -- 100 % --  01/30/20 1145 118/78 --  -- (!) 109 -- 100 % --  01/30/20 1130 (!) 103/56 -- -- (!) 124 -- 99 % --  01/30/20 1115 (!) 116/56 -- -- (!) 122 -- 99 % --  01/30/20 1100 127/72 -- -- (!) 122 -- 100 % --  01/30/20 1045 129/70 -- -- (!) 129 -- 99 % --  01/30/20 1041 (!) 145/100 98.4 F (36.9 C) Oral (!) 136 18 100 % --  01/30/20 1032 -- -- -- -- -- -- (!) 136.5 kg  01/30/20 1030 (!) 145/100 -- -- Marland Kitchen  122 -- 100 % --   Results for orders placed or performed during the hospital encounter of 01/30/20 (from the past 24 hour(s))  CBC     Status: Abnormal   Collection Time: 01/30/20 10:47 AM  Result Value Ref Range   WBC 11.3 (H) 4.0 - 10.5 K/uL   RBC 5.04 3.87 - 5.11 MIL/uL   Hemoglobin 10.0 (L) 12.0 - 15.0 g/dL   HCT 31.7 (L) 36.0 - 46.0 %   MCV 62.9 (L) 80.0 - 100.0 fL   MCH 19.8 (L) 26.0 - 34.0 pg   MCHC 31.5 30.0 - 36.0 g/dL   RDW 15.5 11.5 - 15.5 %   Platelets 284 150 - 400 K/uL   nRBC 0.2 0.0 - 0.2 %  Comprehensive metabolic panel     Status: Abnormal   Collection Time: 01/30/20 10:47 AM  Result Value Ref Range   Sodium 136 135 - 145 mmol/L   Potassium 3.6 3.5 - 5.1 mmol/L   Chloride 105 98 - 111 mmol/L   CO2 19 (L) 22 - 32 mmol/L   Glucose, Bld 123 (H) 70 - 99 mg/dL   BUN 5 (L) 6 - 20 mg/dL   Creatinine, Ser 0.60 0.44 - 1.00 mg/dL   Calcium 8.7 (L) 8.9 - 10.3 mg/dL   Total Protein 6.5 6.5 - 8.1 g/dL   Albumin 2.6 (L) 3.5 - 5.0 g/dL   AST 20 15 - 41 U/L   ALT 19 0 - 44 U/L   Alkaline Phosphatase 106 38 - 126 U/L   Total Bilirubin 0.6 0.3 - 1.2 mg/dL   GFR calc non Af Amer >60 >60 mL/min   GFR calc Af Amer >60 >60 mL/min   Anion gap 12 5 - 15  Protein / creatinine ratio, urine     Status: Abnormal   Collection Time: 01/30/20 10:52 AM  Result Value Ref Range   Creatinine, Urine 239.52 mg/dL   Total Protein, Urine 126 mg/dL   Protein Creatinine Ratio 0.53 (H) 0.00 - 0.15 mg/mg[Cre]   Meds ordered this encounter  Medications  . labetalol (NORMODYNE) 200 MG tablet    Sig: Take 2 tablets (400 mg  total) by mouth 2 (two) times daily.    Dispense:  30 tablet    Refill:  3    Order Specific Question:   Supervising Provider    Answer:   [place] Mode [id number]   Assessment and Plan  --29 y.o. G2P1001 at [redacted]w[redacted]d  --Superimposed Preeclampsia w/o Severe Features --New timing and dosage of Labetalol per Dr. Roselie Awkward --Discharge home in stable condition with strict precautions r/t severe features  F/U: --MFM BPP tomorrow --Message sent to scheduled IOL 02/04/20 at 37.0 weeks, orders placed  Darlina Rumpf, CNM 01/30/2020, 1:26 PM

## 2020-01-30 NOTE — MAU Note (Signed)
Pt presents to MAU due to elevated blood pressure at home. She has felt weak and tired since yesterday. Pt denies HA, epigastric pain, and visual changes. Pt has not felt baby move as much today.

## 2020-01-30 NOTE — Discharge Instructions (Signed)
Preeclampsia and Eclampsia Preeclampsia is a serious condition that may develop during pregnancy. This condition causes high blood pressure and increased protein in your urine along with other symptoms, such as headaches and vision changes. These symptoms may develop as the condition gets worse. Preeclampsia may occur at 20 weeks of pregnancy or later. Diagnosing and treating preeclampsia early is very important. If not treated early, it can cause serious problems for you and your baby. One problem it can lead to is eclampsia. Eclampsia is a condition that causes muscle jerking or shaking (convulsions or seizures) and other serious problems for the mother. During pregnancy, delivering your baby may be the best treatment for preeclampsia or eclampsia. For most women, preeclampsia and eclampsia symptoms go away after giving birth. In rare cases, a woman may develop preeclampsia after giving birth (postpartum preeclampsia). This usually occurs within 48 hours after childbirth but may occur up to 6 weeks after giving birth. What are the causes? The cause of preeclampsia is not known. What increases the risk? The following risk factors make you more likely to develop preeclampsia:  Being pregnant for the first time.  Having had preeclampsia during a past pregnancy.  Having a family history of preeclampsia.  Having high blood pressure.  Being pregnant with more than one baby.  Being 35 or older.  Being African-American.  Having kidney disease or diabetes.  Having medical conditions such as lupus or blood diseases.  Being very overweight (obese). What are the signs or symptoms? The most common symptoms are:  Severe headaches.  Vision problems, such as blurred or double vision.  Abdominal pain, especially upper abdominal pain. Other symptoms that may develop as the condition gets worse include:  Sudden weight gain.  Sudden swelling of the hands, face, legs, and feet.  Severe nausea  and vomiting.  Numbness in the face, arms, legs, and feet.  Dizziness.  Urinating less than usual.  Slurred speech.  Convulsions or seizures. How is this diagnosed? There are no screening tests for preeclampsia. Your health care provider will ask you about symptoms and check for signs of preeclampsia during your prenatal visits. You may also have tests that include:  Checking your blood pressure.  Urine tests to check for protein. Your health care provider will check for this at every prenatal visit.  Blood tests.  Monitoring your baby's heart rate.  Ultrasound. How is this treated? You and your health care provider will determine the treatment approach that is best for you. Treatment may include:  Having more frequent prenatal exams to check for signs of preeclampsia, if you have an increased risk for preeclampsia.  Medicine to lower your blood pressure.  Staying in the hospital, if your condition is severe. There, treatment will focus on controlling your blood pressure and the amount of fluids in your body (fluid retention).  Taking medicine (magnesium sulfate) to prevent seizures. This may be given as an injection or through an IV.  Taking a low-dose aspirin during your pregnancy.  Delivering your baby early. You may have your labor started with medicine (induced), or you may have a cesarean delivery. Follow these instructions at home: Eating and drinking   Drink enough fluid to keep your urine pale yellow.  Avoid caffeine. Lifestyle  Do not use any products that contain nicotine or tobacco, such as cigarettes and e-cigarettes. If you need help quitting, ask your health care provider.  Do not use alcohol or drugs.  Avoid stress as much as possible. Rest and get   plenty of sleep. General instructions  Take over-the-counter and prescription medicines only as told by your health care provider.  When lying down, lie on your left side. This keeps pressure off your  major blood vessels.  When sitting or lying down, raise (elevate) your feet. Try putting some pillows underneath your lower legs.  Exercise regularly. Ask your health care provider what kinds of exercise are best for you.  Keep all follow-up and prenatal visits as told by your health care provider. This is important. How is this prevented? There is no known way of preventing preeclampsia or eclampsia from developing. However, to lower your risk of complications and detect problems early:  Get regular prenatal care. Your health care provider may be able to diagnose and treat the condition early.  Maintain a healthy weight. Ask your health care provider for help managing weight gain during pregnancy.  Work with your health care provider to manage any long-term (chronic) health conditions you have, such as diabetes or kidney problems.  You may have tests of your blood pressure and kidney function after giving birth.  Your health care provider may have you take low-dose aspirin during your next pregnancy. Contact a health care provider if:  You have symptoms that your health care provider told you may require more treatment or monitoring, such as: ? Headaches. ? Nausea or vomiting. ? Abdominal pain. ? Dizziness. ? Light-headedness. Get help right away if:  You have severe: ? Abdominal pain. ? Headaches that do not get better. ? Dizziness. ? Vision problems. ? Confusion. ? Nausea or vomiting.  You have any of the following: ? A seizure. ? Sudden, rapid weight gain. ? Sudden swelling in your hands, ankles, or face. ? Trouble moving any part of your body. ? Numbness in any part of your body. ? Trouble speaking. ? Abnormal bleeding.  You faint. Summary  Preeclampsia is a serious condition that may develop during pregnancy.  This condition causes high blood pressure and increased protein in your urine along with other symptoms, such as headaches and vision  changes.  Diagnosing and treating preeclampsia early is very important. If not treated early, it can cause serious problems for you and your baby.  Get help right away if you have symptoms that your health care provider told you to watch for. This information is not intended to replace advice given to you by your health care provider. Make sure you discuss any questions you have with your health care provider. Document Revised: 04/17/2018 Document Reviewed: 03/21/2016 Elsevier Patient Education  2020 Elsevier Inc.  

## 2020-01-30 NOTE — Telephone Encounter (Signed)
TC to pt regarding elevated B/P reported from babyscripts of 167/74 last night.  Pt states she is not feeling well and having nausea  Denies any swelling, no visual changes and No HA's  Pt taking Aspirin 81 mg and Labetalol 200 mg.. Pt repeated B/P while on phone B/P was 184/83 P:104  Consulted with Dr.Ervin here in the office advised that pt report to MAU.   Pt made aware and voiced understanding.

## 2020-01-30 NOTE — Progress Notes (Signed)
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

## 2020-01-30 NOTE — Progress Notes (Signed)
Induction Assessment Scheduling Form: Fax to [facility]:  [phone] Route to [place]                                                                                   DOB:  05/31/1991                                                            MRN:  [id number]  Phone:  Home Phone [phone]  Mobile [phone]    Provider:  [facility] (Faculty Practice)  Admission Date/Time:  02/04/2020 GP:  [id number]     Gestational age on admission:  37.0                                               Estimated Date of Delivery: 02/25/20  Dating Criteria: [place] at 9 weeks  Filed Weights   01/30/20 1032  Weight: (!) 136.5 kg    GBS:  Unknown (Collected in MAU 01/30/2020) HIV:   Neg  Reason for induction:  Chronic Hypertension with Superimposed Preeclampsia (no severe features) Scheduling Provider Signature:  Darlina Rumpf, CNM     Method of induction(proposed):  Pitocin (Desires TOLAC)  Scheduling Provider Signature:  Darlina Rumpf, CNM                                            Today's Date:  01/30/2020

## 2020-01-31 ENCOUNTER — Ambulatory Visit: Payer: BC Managed Care – PPO

## 2020-01-31 ENCOUNTER — Inpatient Hospital Stay (HOSPITAL_COMMUNITY)
Admission: AD | Admit: 2020-01-31 | Discharge: 2020-02-03 | DRG: 788 | Disposition: A | Discharge status: Home / Self Care | Payer: BC Managed Care – PPO | Attending: Obstetrics and Gynecology | Admitting: Obstetrics and Gynecology

## 2020-01-31 ENCOUNTER — Inpatient Hospital Stay (HOSPITAL_COMMUNITY): Payer: BC Managed Care – PPO | Admitting: Anesthesiology

## 2020-01-31 ENCOUNTER — Encounter (HOSPITAL_COMMUNITY)
Admission: AD | Disposition: A | Discharge status: Home / Self Care | Payer: Self-pay | Source: Home / Self Care | Attending: Obstetrics and Gynecology

## 2020-01-31 ENCOUNTER — Encounter (HOSPITAL_COMMUNITY): Payer: Self-pay | Admitting: Obstetrics & Gynecology

## 2020-01-31 DIAGNOSIS — O328XX0 Maternal care for other malpresentation of fetus, not applicable or unspecified: Secondary | ICD-10-CM | POA: Diagnosis not present

## 2020-01-31 DIAGNOSIS — O9902 Anemia complicating childbirth: Secondary | ICD-10-CM | POA: Diagnosis present

## 2020-01-31 DIAGNOSIS — O1410 Severe pre-eclampsia, unspecified trimester: Secondary | ICD-10-CM | POA: Diagnosis present

## 2020-01-31 DIAGNOSIS — O10919 Unspecified pre-existing hypertension complicating pregnancy, unspecified trimester: Secondary | ICD-10-CM | POA: Diagnosis present

## 2020-01-31 DIAGNOSIS — O99214 Obesity complicating childbirth: Secondary | ICD-10-CM | POA: Diagnosis present

## 2020-01-31 DIAGNOSIS — Z3A36 36 weeks gestation of pregnancy: Secondary | ICD-10-CM

## 2020-01-31 DIAGNOSIS — O34219 Maternal care for unspecified type scar from previous cesarean delivery: Secondary | ICD-10-CM | POA: Diagnosis present

## 2020-01-31 DIAGNOSIS — D509 Iron deficiency anemia, unspecified: Secondary | ICD-10-CM | POA: Diagnosis present

## 2020-01-31 DIAGNOSIS — O1002 Pre-existing essential hypertension complicating childbirth: Secondary | ICD-10-CM | POA: Diagnosis present

## 2020-01-31 DIAGNOSIS — O99210 Obesity complicating pregnancy, unspecified trimester: Secondary | ICD-10-CM | POA: Diagnosis present

## 2020-01-31 DIAGNOSIS — Z87891 Personal history of nicotine dependence: Secondary | ICD-10-CM

## 2020-01-31 DIAGNOSIS — O34211 Maternal care for low transverse scar from previous cesarean delivery: Secondary | ICD-10-CM

## 2020-01-31 DIAGNOSIS — O119 Pre-existing hypertension with pre-eclampsia, unspecified trimester: Secondary | ICD-10-CM | POA: Diagnosis present

## 2020-01-31 DIAGNOSIS — O114 Pre-existing hypertension with pre-eclampsia, complicating childbirth: Principal | ICD-10-CM | POA: Diagnosis present

## 2020-01-31 DIAGNOSIS — Z20822 Contact with and (suspected) exposure to covid-19: Secondary | ICD-10-CM | POA: Diagnosis present

## 2020-01-31 DIAGNOSIS — O321XX0 Maternal care for breech presentation, not applicable or unspecified: Secondary | ICD-10-CM | POA: Diagnosis present

## 2020-01-31 DIAGNOSIS — Z3480 Encounter for supervision of other normal pregnancy, unspecified trimester: Secondary | ICD-10-CM

## 2020-01-31 DIAGNOSIS — O1414 Severe pre-eclampsia complicating childbirth: Secondary | ICD-10-CM

## 2020-01-31 DIAGNOSIS — O99013 Anemia complicating pregnancy, third trimester: Secondary | ICD-10-CM | POA: Diagnosis present

## 2020-01-31 DIAGNOSIS — O113 Pre-existing hypertension with pre-eclampsia, third trimester: Secondary | ICD-10-CM | POA: Diagnosis not present

## 2020-01-31 DIAGNOSIS — O10913 Unspecified pre-existing hypertension complicating pregnancy, third trimester: Secondary | ICD-10-CM | POA: Diagnosis not present

## 2020-01-31 LAB — PROTEIN / CREATININE RATIO, URINE
Creatinine, Urine: 307.96 mg/dL
Protein Creatinine Ratio: 0.32 mg/mg{Cre} — ABNORMAL HIGH (ref 0.00–0.15)
Total Protein, Urine: 99 mg/dL

## 2020-01-31 LAB — COMPREHENSIVE METABOLIC PANEL
ALT: 19 U/L (ref 0–44)
AST: 17 U/L (ref 15–41)
Albumin: 2.5 g/dL — ABNORMAL LOW (ref 3.5–5.0)
Alkaline Phosphatase: 117 U/L (ref 38–126)
Anion gap: 11 (ref 5–15)
BUN: 5 mg/dL — ABNORMAL LOW (ref 6–20)
CO2: 19 mmol/L — ABNORMAL LOW (ref 22–32)
Calcium: 8.7 mg/dL — ABNORMAL LOW (ref 8.9–10.3)
Chloride: 106 mmol/L (ref 98–111)
Creatinine, Ser: 0.67 mg/dL (ref 0.44–1.00)
GFR calc Af Amer: >60 mL/min (ref >60)
GFR calc non Af Amer: >60 mL/min (ref >60)
Glucose, Bld: 102 mg/dL — ABNORMAL HIGH (ref 70–99)
Potassium: 3.5 mmol/L (ref 3.5–5.1)
Sodium: 136 mmol/L (ref 135–145)
Total Bilirubin: 0.7 mg/dL (ref 0.3–1.2)
Total Protein: 6.5 g/dL (ref 6.5–8.1)

## 2020-01-31 LAB — CBC
HCT: 32 % — ABNORMAL LOW (ref 36.0–46.0)
Hemoglobin: 9.9 g/dL — ABNORMAL LOW (ref 12.0–15.0)
MCH: 19.6 pg — ABNORMAL LOW (ref 26.0–34.0)
MCHC: 30.9 g/dL (ref 30.0–36.0)
MCV: 63.5 fL — ABNORMAL LOW (ref 80.0–100.0)
Platelets: 296 10*3/uL (ref 150–400)
RBC: 5.04 MIL/uL (ref 3.87–5.11)
RDW: 15.5 % (ref 11.5–15.5)
WBC: 10.7 10*3/uL — ABNORMAL HIGH (ref 4.0–10.5)
nRBC: 0.2 % (ref 0.0–0.2)

## 2020-01-31 LAB — URINALYSIS, ROUTINE W REFLEX MICROSCOPIC
Bacteria, UA: NONE SEEN
Bilirubin Urine: NEGATIVE
Glucose, UA: NEGATIVE mg/dL
Hgb urine dipstick: NEGATIVE
Ketones, ur: NEGATIVE mg/dL
Leukocytes,Ua: NEGATIVE
Nitrite: NEGATIVE
Protein, ur: 100 mg/dL — AB
Specific Gravity, Urine: 1.027 (ref 1.005–1.030)
pH: 5 (ref 5.0–8.0)

## 2020-01-31 LAB — TYPE AND SCREEN
ABO/RH(D): A POS
Antibody Screen: NEGATIVE

## 2020-01-31 LAB — ABO/RH: ABO/RH(D): A POS

## 2020-01-31 LAB — SARS CORONAVIRUS 2 (TAT 6-24 HRS): SARS Coronavirus 2: NEGATIVE

## 2020-01-31 SURGERY — Surgical Case
Anesthesia: Spinal | Wound class: Clean Contaminated

## 2020-01-31 MED ORDER — LABETALOL HCL 5 MG/ML IV SOLN: 80.0000 mg | INTRAVENOUS | Status: DC | PRN

## 2020-01-31 MED ORDER — DIPHENHYDRAMINE HCL 50 MG/ML IJ SOLN
INTRAMUSCULAR | Status: AC
  Filled 2020-01-31: qty 1

## 2020-01-31 MED ORDER — HYDROMORPHONE HCL 2 MG PO TABS
2.0000 mg | ORAL_TABLET | Freq: Once | ORAL | Status: AC
  Administered 2020-01-31: 2 mg via ORAL
  Filled 2020-01-31 (×2): qty 1

## 2020-01-31 MED ORDER — OXYTOCIN-SODIUM CHLORIDE 30-0.9 UT/500ML-% IV SOLN
1.0000 m[IU]/min | INTRAVENOUS | Status: DC
  Administered 2020-01-31: 2 m[IU]/min via INTRAVENOUS
  Filled 2020-01-31: qty 500

## 2020-01-31 MED ORDER — LIDOCAINE HCL (PF) 1 % IJ SOLN: 30.0000 mL | INTRAMUSCULAR | Status: DC | PRN

## 2020-01-31 MED ORDER — LABETALOL HCL 5 MG/ML IV SOLN: 20.0000 mg | INTRAVENOUS | Status: DC | PRN

## 2020-01-31 MED ORDER — OXYTOCIN-SODIUM CHLORIDE 30-0.9 UT/500ML-% IV SOLN: 2.5000 [IU]/h | INTRAVENOUS | Status: DC

## 2020-01-31 MED ORDER — LABETALOL HCL 200 MG PO TABS
400.0000 mg | ORAL_TABLET | Freq: Two times a day (BID) | ORAL | Status: DC
  Administered 2020-01-31 – 2020-02-01 (×4): 400 mg via ORAL
  Filled 2020-01-31 (×3): qty 2
  Filled 2020-01-31: qty 4
  Filled 2020-01-31: qty 2

## 2020-01-31 MED ORDER — MAGNESIUM SULFATE 40 GM/1000ML IV SOLN
2.0000 g/h | INTRAVENOUS | Status: DC
  Administered 2020-01-31: 2 g/h via INTRAVENOUS
  Filled 2020-01-31: qty 1000

## 2020-01-31 MED ORDER — BUTALBITAL-APAP-CAFFEINE 50-325-40 MG PO TABS
1.0000 | ORAL_TABLET | ORAL | Status: DC | PRN
  Administered 2020-01-31: 1 via ORAL
  Filled 2020-01-31: qty 1

## 2020-01-31 MED ORDER — CALCIUM CARBONATE ANTACID 500 MG PO CHEW: 2.0000 | CHEWABLE_TABLET | ORAL | Status: DC | PRN

## 2020-01-31 MED ORDER — LACTATED RINGERS IV SOLN: 500.0000 mL | INTRAVENOUS | Status: DC | PRN

## 2020-01-31 MED ORDER — CYCLOBENZAPRINE HCL 10 MG PO TABS
10.0000 mg | ORAL_TABLET | Freq: Three times a day (TID) | ORAL | Status: DC | PRN
  Administered 2020-01-31: 10 mg via ORAL
  Filled 2020-01-31: qty 2

## 2020-01-31 MED ORDER — DEXAMETHASONE SODIUM PHOSPHATE 4 MG/ML IJ SOLN
INTRAMUSCULAR | Status: DC | PRN
  Administered 2020-01-31: 4 mg via INTRAVENOUS

## 2020-01-31 MED ORDER — DEXTROSE 5 % IV SOLN
INTRAVENOUS | Status: AC
  Filled 2020-01-31: qty 3000

## 2020-01-31 MED ORDER — ONDANSETRON HCL 4 MG/2ML IJ SOLN
INTRAMUSCULAR | Status: AC
  Filled 2020-01-31: qty 2

## 2020-01-31 MED ORDER — ONDANSETRON HCL 4 MG/2ML IJ SOLN: 4.0000 mg | Freq: Four times a day (QID) | INTRAMUSCULAR | Status: DC | PRN

## 2020-01-31 MED ORDER — DEXTROSE 5 % IV SOLN
3.0000 g | INTRAVENOUS | Status: AC
  Administered 2020-01-31: 3 g via INTRAVENOUS
  Filled 2020-01-31: qty 3000

## 2020-01-31 MED ORDER — SOD CITRATE-CITRIC ACID 500-334 MG/5ML PO SOLN
30.0000 mL | ORAL | Status: AC
  Administered 2020-01-31: 30 mL via ORAL

## 2020-01-31 MED ORDER — PHENYLEPHRINE 40 MCG/ML (10ML) SYRINGE FOR IV PUSH (FOR BLOOD PRESSURE SUPPORT)
PREFILLED_SYRINGE | INTRAVENOUS | Status: DC | PRN
  Administered 2020-01-31: 200 ug via INTRAVENOUS
  Administered 2020-01-31: 300 ug via INTRAVENOUS
  Administered 2020-01-31: 200 ug via INTRAVENOUS
  Administered 2020-01-31: 300 ug via INTRAVENOUS
  Administered 2020-01-31 (×2): 200 ug via INTRAVENOUS

## 2020-01-31 MED ORDER — HYDRALAZINE HCL 20 MG/ML IJ SOLN: 10.0000 mg | INTRAMUSCULAR | Status: DC | PRN

## 2020-01-31 MED ORDER — ONDANSETRON HCL 4 MG/2ML IJ SOLN
INTRAMUSCULAR | Status: DC | PRN
  Administered 2020-01-31: 4 mg via INTRAVENOUS

## 2020-01-31 MED ORDER — PRENATAL MULTIVITAMIN CH
1.0000 | ORAL_TABLET | Freq: Every day | ORAL | Status: DC
  Administered 2020-01-31: 1 via ORAL
  Filled 2020-01-31: qty 1

## 2020-01-31 MED ORDER — PHENYLEPHRINE HCL-NACL 20-0.9 MG/250ML-% IV SOLN
INTRAVENOUS | Status: DC | PRN
  Administered 2020-01-31: 60 ug/min via INTRAVENOUS

## 2020-01-31 MED ORDER — OXYTOCIN-SODIUM CHLORIDE 30-0.9 UT/500ML-% IV SOLN
INTRAVENOUS | Status: DC | PRN
  Administered 2020-01-31: 300 mL via INTRAVENOUS

## 2020-01-31 MED ORDER — DIPHENHYDRAMINE HCL 50 MG/ML IJ SOLN
INTRAMUSCULAR | Status: DC | PRN
  Administered 2020-01-31: 12.5 mg via INTRAVENOUS

## 2020-01-31 MED ORDER — SOD CITRATE-CITRIC ACID 500-334 MG/5ML PO SOLN
30.0000 mL | ORAL | Status: DC | PRN
  Filled 2020-01-31: qty 30

## 2020-01-31 MED ORDER — DEXAMETHASONE SODIUM PHOSPHATE 4 MG/ML IJ SOLN
INTRAMUSCULAR | Status: AC
  Filled 2020-01-31: qty 1

## 2020-01-31 MED ORDER — ZOLPIDEM TARTRATE 5 MG PO TABS
5.0000 mg | ORAL_TABLET | Freq: Every evening | ORAL | Status: DC | PRN
  Filled 2020-01-31: qty 1

## 2020-01-31 MED ORDER — TERBUTALINE SULFATE 1 MG/ML IJ SOLN: 0.2500 mg | Freq: Once | INTRAMUSCULAR | Status: DC | PRN

## 2020-01-31 MED ORDER — FENTANYL CITRATE (PF) 100 MCG/2ML IJ SOLN
INTRAMUSCULAR | Status: AC
  Filled 2020-01-31: qty 2

## 2020-01-31 MED ORDER — METOCLOPRAMIDE HCL 5 MG/ML IJ SOLN
10.0000 mg | Freq: Once | INTRAMUSCULAR | Status: AC
  Administered 2020-01-31: 10 mg via INTRAVENOUS
  Filled 2020-01-31: qty 2

## 2020-01-31 MED ORDER — MORPHINE SULFATE (PF) 0.5 MG/ML IJ SOLN
INTRAMUSCULAR | Status: AC
  Filled 2020-01-31: qty 10

## 2020-01-31 MED ORDER — LACTATED RINGERS IV SOLN: INTRAVENOUS | Status: DC

## 2020-01-31 MED ORDER — DOCUSATE SODIUM 100 MG PO CAPS
100.0000 mg | ORAL_CAPSULE | Freq: Every day | ORAL | Status: DC
  Administered 2020-01-31: 100 mg via ORAL
  Filled 2020-01-31: qty 1

## 2020-01-31 MED ORDER — PHENYLEPHRINE 40 MCG/ML (10ML) SYRINGE FOR IV PUSH (FOR BLOOD PRESSURE SUPPORT)
PREFILLED_SYRINGE | INTRAVENOUS | Status: AC
  Filled 2020-01-31: qty 20

## 2020-01-31 MED ORDER — PHENYLEPHRINE HCL-NACL 20-0.9 MG/250ML-% IV SOLN
INTRAVENOUS | Status: AC
  Filled 2020-01-31: qty 250

## 2020-01-31 MED ORDER — ZOLPIDEM TARTRATE 5 MG PO TABS: 5.0000 mg | ORAL_TABLET | Freq: Once | ORAL | Status: DC

## 2020-01-31 MED ORDER — OXYTOCIN-SODIUM CHLORIDE 30-0.9 UT/500ML-% IV SOLN
INTRAVENOUS | Status: AC
  Filled 2020-01-31: qty 1000

## 2020-01-31 MED ORDER — OXYTOCIN BOLUS FROM INFUSION: 500.0000 mL | Freq: Once | INTRAVENOUS | Status: DC

## 2020-01-31 MED ORDER — ACETAMINOPHEN 500 MG PO TABS
1000.0000 mg | ORAL_TABLET | Freq: Once | ORAL | Status: AC
  Administered 2020-01-31: 1000 mg via ORAL
  Filled 2020-01-31: qty 2

## 2020-01-31 MED ORDER — ONDANSETRON 4 MG PO TBDP
8.0000 mg | ORAL_TABLET | Freq: Once | ORAL | Status: AC
  Administered 2020-01-31: 8 mg via ORAL
  Filled 2020-01-31: qty 2

## 2020-01-31 MED ORDER — LABETALOL HCL 5 MG/ML IV SOLN: 40.0000 mg | INTRAVENOUS | Status: DC | PRN

## 2020-01-31 MED ORDER — MAGNESIUM SULFATE BOLUS VIA INFUSION
4.0000 g | Freq: Once | INTRAVENOUS | Status: AC
  Administered 2020-01-31: 4 g via INTRAVENOUS
  Filled 2020-01-31: qty 1000

## 2020-01-31 SURGICAL SUPPLY — 45 items
BENZOIN TINCTURE PRP APPL 2/3 (GAUZE/BANDAGES/DRESSINGS) ×3 IMPLANT
CHLORAPREP W/TINT 26ML (MISCELLANEOUS) ×3 IMPLANT
CLAMP CORD UMBIL (MISCELLANEOUS) IMPLANT
CLOSURE STERI STRIP 1/2 X4 (GAUZE/BANDAGES/DRESSINGS) ×2 IMPLANT
CLOSURE WOUND 1/2 X4 (GAUZE/BANDAGES/DRESSINGS) ×1
CLOTH BEACON ORANGE TIMEOUT ST (SAFETY) ×3 IMPLANT
DRAPE C SECTION CLR SCREEN (DRAPES) IMPLANT
DRSG OPSITE POSTOP 4X10 (GAUZE/BANDAGES/DRESSINGS) ×3 IMPLANT
ELECT REM PT RETURN 9FT ADLT (ELECTROSURGICAL) ×3
ELECTRODE REM PT RTRN 9FT ADLT (ELECTROSURGICAL) ×1 IMPLANT
EXTENDER TRAXI PANNICULUS (MISCELLANEOUS) ×1 IMPLANT
EXTRACTOR VACUUM M CUP 4 TUBE (SUCTIONS) IMPLANT
EXTRACTOR VACUUM M CUP 4' TUBE (SUCTIONS)
GAUZE SPONGE 4X4 12PLY STRL LF (GAUZE/BANDAGES/DRESSINGS) ×6 IMPLANT
GLOVE BIO SURGEON STRL SZ7.5 (GLOVE) ×3 IMPLANT
GLOVE BIOGEL PI IND STRL 7.0 (GLOVE) ×1 IMPLANT
GLOVE BIOGEL PI INDICATOR 7.0 (GLOVE) ×2
GOWN STRL REUS W/TWL 2XL LVL3 (GOWN DISPOSABLE) ×3 IMPLANT
GOWN STRL REUS W/TWL LRG LVL3 (GOWN DISPOSABLE) ×6 IMPLANT
HOVERMATT SINGLE USE (MISCELLANEOUS) ×3 IMPLANT
KIT ABG SYR 3ML LUER SLIP (SYRINGE) IMPLANT
NEEDLE HYPO 22GX1.5 SAFETY (NEEDLE) ×3 IMPLANT
NEEDLE HYPO 25X5/8 SAFETYGLIDE (NEEDLE) IMPLANT
NS IRRIG 1000ML POUR BTL (IV SOLUTION) ×3 IMPLANT
PACK C SECTION WH (CUSTOM PROCEDURE TRAY) ×3 IMPLANT
PAD ABD 7.5X8 STRL (GAUZE/BANDAGES/DRESSINGS) ×3 IMPLANT
PAD OB MATERNITY 4.3X12.25 (PERSONAL CARE ITEMS) ×3 IMPLANT
PENCIL SMOKE EVAC W/HOLSTER (ELECTROSURGICAL) ×3 IMPLANT
RTRCTR C-SECT PINK 25CM LRG (MISCELLANEOUS) ×3 IMPLANT
STRIP CLOSURE SKIN 1/2X4 (GAUZE/BANDAGES/DRESSINGS) ×2 IMPLANT
SUT CHROMIC 1 CTX 36 (SUTURE) ×6 IMPLANT
SUT VIC AB 1 CT1 36 (SUTURE) ×6 IMPLANT
SUT VIC AB 2-0 CT1 (SUTURE) ×3 IMPLANT
SUT VIC AB 2-0 CT1 27 (SUTURE) ×2
SUT VIC AB 2-0 CT1 TAPERPNT 27 (SUTURE) ×1 IMPLANT
SUT VIC AB 3-0 CT1 27 (SUTURE) ×4
SUT VIC AB 3-0 CT1 TAPERPNT 27 (SUTURE) ×2 IMPLANT
SUT VIC AB 3-0 SH 27 (SUTURE)
SUT VIC AB 3-0 SH 27X BRD (SUTURE) IMPLANT
SUT VIC AB 4-0 KS 27 (SUTURE) ×3 IMPLANT
SYR BULB IRRIGATION 50ML (SYRINGE) IMPLANT
TOWEL OR 17X24 6PK STRL BLUE (TOWEL DISPOSABLE) ×3 IMPLANT
TRAXI PANNICULUS EXTENDER (MISCELLANEOUS) ×2
TRAY FOLEY W/BAG SLVR 14FR LF (SET/KITS/TRAYS/PACK) ×3 IMPLANT
WATER STERILE IRR 1000ML POUR (IV SOLUTION) ×3 IMPLANT

## 2020-01-31 NOTE — Progress Notes (Signed)
Came to bedside to assess as RN having difficulty finding heart tones.  Requested [place] and baby was found to be in breech presentation. Discussed with patient and reviewed options of ECV vs repeat cesarean. She deliberated with her support persons and ultimately chose to have a repeat cesarean.   The risks of cesarean section discussed with the patient included but were not limited to: bleeding which may require transfusion or reoperation; infection which may require antibiotics; injury to bowel, bladder, ureters or other surrounding organs; injury to the fetus; need for additional procedures including hysterectomy in the event of a life-threatening hemorrhage; placental abnormalities with subsequent pregnancies, incisional problems, thromboembolic phenomenon and other postoperative/anesthesia complications. The patient concurred with the proposed plan, giving informed written consent for the procedure. Patient last ate at approximately 2 PM and has been on clear liquids since then. Anesthesia and OR aware. Preoperative prophylactic antibiotics and SCDs ordered on call to the OR. To OR when ready.   Dr. Rip Harbour notified and in agreement with plan.

## 2020-01-31 NOTE — MAU Provider Note (Addendum)
History     [id number]  Arrival date and time: 01/31/20 0547    Chief Complaint  Patient presents with  . Abdominal Pain     HPI Julie King is a 29 y.o. at [redacted]w[redacted]d by 13wk [place] with PMHx notable for recent diagnosis of PreE w/o SF, who presents for RUQ pain.   Seen in MAU yesterday afternoon for elevated BPs Diagnosed with cHTN with superimposed PreE w/o SF, plan made for IOL at 37w on 02/04/2020 Labetalol increased from 200>400mg  BID  Today reports she continues to not feel well Feels nausea, weak, shaky Also started to have a sharp pain in her RUQ that woke her from sleep The pain is sharp, shooting, and intermittent, not constant Lasts a few seconds and then goes away, happens maybe ever 10-15 minutes Has not noticed anything that makes it better or worse She took tylenol around 4:45 AM for the pain, does not feel like it helped at all  Denies headache, vision changes, chest pain, SOB, LE edema Has been compliant with increased labetalol dosing Denies vaginal bleeding, loss of fluid, contractions She is feeling baby move      OB History    Gravida  2   Para  1   Term  1   Preterm      AB      Living  1     SAB      TAB      Ectopic      Multiple      Live Births  1           Past Medical History:  Diagnosis Date  . Atrial fibrillation ([place])   . Headache 07/25/2017  . Hypertension   . Irritable bowel syndrome   . Morbidly obese ([place]) 07/25/2017  . QT prolongation   . T wave inversion in EKG     Past Surgical History:  Procedure Laterality Date  . CESAREAN SECTION    . CHOLECYSTECTOMY    . HERNIA REPAIR    . TONSILLECTOMY      Family History  Problem Relation Age of Onset  . Hypertension Other   . Diabetes Other   . Crohn's disease Mother   . Hypertension Mother   . Thyroid disease Father   . Diabetes Father   . Breast cancer Maternal Aunt        late 26 early 87s  . Breast cancer Maternal Grandmother        late 35s early  24    Social History   Socioeconomic History  . Marital status: Single    Spouse name: Not on file  . Number of children: Not on file  . Years of education: Not on file  . Highest education level: Not on file  Occupational History  . Occupation: Barrister's clerk apartment  Tobacco Use  . Smoking status: Former Smoker    Packs/day: 0.50    Years: 10.00    Pack years: 5.00    Types: Cigarettes  . Smokeless tobacco: Never Used  . Tobacco comment: wants to quit but states very difficult Jan 2020  Substance and Sexual Activity  . Alcohol use: Not Currently  . Drug use: No  . Sexual activity: Not Currently    Comment: with female  Other Topics Concern  . Not on file  Social History Narrative   Lives with partner   Caffeine use: Coffee daily   Soda sometimes   Right handed    Has one  daughter   Social Determinants of Health   Financial Resource Strain:   . Difficulty of Paying Living Expenses:   Food Insecurity:   . Worried About Charity fundraiser in the Last Year:   . Arboriculturist in the Last Year:   Transportation Needs:   . Film/video editor (Medical):   Marland Kitchen Lack of Transportation (Non-Medical):   Physical Activity:   . Days of Exercise per Week:   . Minutes of Exercise per Session:   Stress:   . Feeling of Stress :   Social Connections:   . Frequency of Communication with Friends and Family:   . Frequency of Social Gatherings with Friends and Family:   . Attends Religious Services:   . Active Member of Clubs or Organizations:   . Attends Archivist Meetings:   Marland Kitchen Marital Status:   Intimate Partner Violence:   . Fear of Current or Ex-Partner:   . Emotionally Abused:   Marland Kitchen Physically Abused:   . Sexually Abused:     Allergies  Allergen Reactions  . Percocet [Oxycodone-Acetaminophen] Hives, Itching and Nausea Only  . Pineapple Itching    Fresh pineapples; pineapples from a can is okay    No current facility-administered medications on file  prior to encounter.   Current Outpatient Medications on File Prior to Encounter  Medication Sig Dispense Refill  . aspirin EC 81 MG tablet Take 81 mg by mouth daily.    . cyclobenzaprine (FLEXERIL) 10 MG tablet Take 1 tablet (10 mg total) by mouth daily as needed for muscle spasms (cramping). 30 tablet 0  . ferrous sulfate 325 (65 FE) MG tablet Take 1 tablet (325 mg total) by mouth daily. 30 tablet 3  . labetalol (NORMODYNE) 200 MG tablet Take 2 tablets (400 mg total) by mouth 2 (two) times daily. 30 tablet 3  . Prenatal Vit-Fe Fumarate-FA (PRENATAL MULTIVITAMIN) TABS tablet Take 1 tablet by mouth daily at 12 noon.    Marland Kitchen albuterol (PROVENTIL HFA;VENTOLIN HFA) 108 (90 Base) MCG/ACT inhaler Inhale 2 puffs into the lungs every 6 (six) hours as needed for wheezing or shortness of breath. 1 Inhaler 5  . Cetirizine HCl 10 MG CAPS Take 1 capsule (10 mg total) by mouth daily for 10 days. 10 capsule 0     ROS Pertinent positives and negative per HPI, all others reviewed and negative  Physical Exam   BP 140/60   Pulse (!) 110   Temp 98.6 F (37 C)   Resp 18   Ht 5\' 7"  (1.702 m)   Wt 135.2 kg   LMP 06/02/2019   SpO2 100%   BMI 46.67 kg/m   Physical Exam  Vitals reviewed. Constitutional: She appears well-developed and well-nourished. No distress.  Eyes: No scleral icterus.  Respiratory: Effort normal. No respiratory distress.  GI: Soft. She exhibits no distension. There is abdominal tenderness. There is no rebound and no guarding.  Mild ttp of RUQ  Musculoskeletal:        General: No edema.  Neurological: She is alert. Coordination normal.  Skin: Skin is warm and dry. She is not diaphoretic.  Psychiatric: She has a normal mood and affect.    Cervical Exam  Deferred  Bedside Ultrasound Not done  FHT Baseline 125, moderate variability, +accels, -decels Toco: no ctx Cat: Cat I  Labs Results for orders placed or performed during the hospital encounter of 01/31/20 (from the  past 24 hour(s))  Urinalysis, Routine w reflex microscopic  Status: Abnormal   Collection Time: 01/31/20  6:10 AM  Result Value Ref Range   Color, Urine AMBER (A) YELLOW   APPearance HAZY (A) CLEAR   Specific Gravity, Urine 1.027 1.005 - 1.030   pH 5.0 5.0 - 8.0   Glucose, UA NEGATIVE NEGATIVE mg/dL   Hgb urine dipstick NEGATIVE NEGATIVE   Bilirubin Urine NEGATIVE NEGATIVE   Ketones, ur NEGATIVE NEGATIVE mg/dL   Protein, ur 100 (A) NEGATIVE mg/dL   Nitrite NEGATIVE NEGATIVE   Leukocytes,Ua NEGATIVE NEGATIVE   RBC / HPF 6-10 0 - 5 RBC/hpf   WBC, UA 0-5 0 - 5 WBC/hpf   Bacteria, UA NONE SEEN NONE SEEN   Squamous Epithelial / LPF 6-10 0 - 5   Mucus PRESENT   Protein / creatinine ratio, urine     Status: Abnormal   Collection Time: 01/31/20  6:10 AM  Result Value Ref Range   Creatinine, Urine 307.96 mg/dL   Total Protein, Urine 99 mg/dL   Protein Creatinine Ratio 0.32 (H) 0.00 - 0.15 mg/mg[Cre]  CBC     Status: Abnormal   Collection Time: 01/31/20  6:48 AM  Result Value Ref Range   WBC 10.7 (H) 4.0 - 10.5 K/uL   RBC 5.04 3.87 - 5.11 MIL/uL   Hemoglobin 9.9 (L) 12.0 - 15.0 g/dL   HCT 32.0 (L) 36.0 - 46.0 %   MCV 63.5 (L) 80.0 - 100.0 fL   MCH 19.6 (L) 26.0 - 34.0 pg   MCHC 30.9 30.0 - 36.0 g/dL   RDW 15.5 11.5 - 15.5 %   Platelets 296 150 - 400 K/uL   nRBC 0.2 0.0 - 0.2 %  Comprehensive metabolic panel     Status: Abnormal   Collection Time: 01/31/20  6:48 AM  Result Value Ref Range   Sodium 136 135 - 145 mmol/L   Potassium 3.5 3.5 - 5.1 mmol/L   Chloride 106 98 - 111 mmol/L   CO2 19 (L) 22 - 32 mmol/L   Glucose, Bld 102 (H) 70 - 99 mg/dL   BUN 5 (L) 6 - 20 mg/dL   Creatinine, Ser 0.67 0.44 - 1.00 mg/dL   Calcium 8.7 (L) 8.9 - 10.3 mg/dL   Total Protein 6.5 6.5 - 8.1 g/dL   Albumin 2.5 (L) 3.5 - 5.0 g/dL   AST 17 15 - 41 U/L   ALT 19 0 - 44 U/L   Alkaline Phosphatase 117 38 - 126 U/L   Total Bilirubin 0.7 0.3 - 1.2 mg/dL   GFR calc non Af Amer >60 >60 mL/min    GFR calc Af Amer >60 >60 mL/min   Anion gap 11 5 - 15    Imaging No results found.  MAU Course  Procedures  Lab Orders     CBC     Comprehensive metabolic panel     Urinalysis, Routine w reflex microscopic No orders of the defined types were placed in this encounter.  Imaging Orders  No imaging studies ordered today    MDM moderate  Assessment and Plan  #RUQ pain #CHTN w SI PreE  Patient representing for atypical but concerning symptom of RUQ pain. Grimacing in pain during my interview intermittently. Possible sign of severe PreE. BP's have been normal to mild range. Will repeat labs and treat pain with PO meds, pending these interventions will reassess.  #FWB FHT Cat I NST: Reactive  Signed out to Jorje Guild at change of shift.  8:03 AM   Clarnce Flock  Patient Vitals for the past 24 hrs:  BP Temp Pulse Resp SpO2 Height Weight  01/31/20 1055 -- -- 92 -- -- -- --  01/31/20 1054 (!) 148/68 -- 92 -- -- -- --  01/31/20 0846 121/74 -- 91 -- -- -- --  01/31/20 0830 137/78 -- 93 -- -- -- --  01/31/20 0816 125/70 -- 99 -- -- -- --  01/31/20 0801 (!) 147/90 -- (!) 107 -- -- -- --  01/31/20 0746 (!) 145/85 -- 96 -- -- -- --  01/31/20 0731 (!) 151/86 -- 100 -- -- -- --  01/31/20 0720 -- -- -- -- 99 % -- --  01/31/20 0716 140/72 -- (!) 107 -- -- -- --  01/31/20 0715 -- -- -- -- 100 % -- --  01/31/20 0700 130/81 -- (!) 113 -- -- -- --  01/31/20 0650 -- -- -- -- 100 % -- --  01/31/20 0645 122/80 -- (!) 130 -- 98 % -- --  01/31/20 0640 -- -- -- -- 99 % -- --  01/31/20 0629 140/60 -- (!) 110 -- -- -- --  01/31/20 0605 134/79 -- (!) 110 -- 100 % -- --  01/31/20 0558 -- 98.6 F (37 C) -- 18 -- 5\' 7"  (1.702 m) 135.2 kg     Patient reports continued RUQ pain after dilaudid. BPs stable & PEC labs normal.  Reviewed patient with Dr. Roselie Awkward who spoke with patient. Will give flexeril 10 mg PO.  Pt reports no improvement in pain after 1 hour & is no complaining of  headache of 6/10. Tylenol 1 gm PO ordered & Dr. Roselie Awkward updated with status.    A: 1. Chronic hypertension with superimposed preeclampsia   2. [redacted] weeks gestation of pregnancy    P: Observe on [facility] unit Dr. Roselie Awkward to place orders  Jorje Guild, NP

## 2020-01-31 NOTE — Progress Notes (Signed)
Labor Progress Note Julie King is a 29 y.o. G2P1001 at 26w3dpresented for IOL for cHTN with superimposed severe Pre-E> S: Met patient and discussed plan. Still has a headache.   O:  BP 138/74   Pulse 84   Temp 98.4 F (36.9 C) (Oral)   Resp 18   Ht _0  (1.702 m)   Wt 135.2 kg   LMP 06/02/2019   SpO2 99%   BMI 46.67 kg/m  EFM: 135, moderate variability, pos accels, no decels, reactive TOCO: None  CVE: Dilation: Closed Effacement (%): Thick Presentation: Vertex Exam by:: dr. fMarice Potter  A&P: 29y.o. G2P1001 391w3dere for IOL for cHTN with superimposed severe Pre-E. #Labor: Vertex by BSUS. Foley bulb placed manually and filled with 60 mL water; patient tolerated well. Will start Pit when patient finishes eating; max 6 while FB in place then max 20 unless IUPC in place. AROM PRN. Anticipate VBAC. #Pain: per patient request #FWB: Cat I #GBS pending; will follow and start PCN if making progress and close to delivery and still no GBS cx back  #cHTN with superimposed Severe Pre-E: Cont Labetalol 400 mg BID. Treating headache. Cont Mag. Pr/Cr 0.32, CMP WNL, Plt 296.  ChChauncey MannMD 2:34 PM

## 2020-01-31 NOTE — Progress Notes (Signed)
Labor Progress Note Julie King is a 29 y.o. G2P1001 at 66w3dpresented for IOL for cHTN with superimposed severe Pre-E> S: Met patient and discussed plan. Still has a headache; plans to sleep.   O:  BP (!) 141/67   Pulse 89   Temp 97.9 F (36.6 C) (Axillary)   Resp 16   Ht '5\' 7"'  (1.702 m)   Wt 135.2 kg   LMP 06/02/2019   SpO2 100%   BMI 46.67 kg/m  EFM: 135, moderate variability, pos accels, no decels, reactive TOCO: None   CVE: Dilation: Closed Effacement (%): Thick Presentation: Vertex Exam by:: k. jones RN    A&P: 29y.o. G2P1001 365w3dere for IOL for cHTN with superimposed severe Pre-E. Plan to give Ambien 19m14mor sleep.   #Labor: Vertex by BSUS. Foley bulb in place. Pit at 6, plan to increase to 20 once foley bulb removed. AROM PRN. Anticipate VBAC. #Pain: per patient request #FWB: Cat I #GBS pending; will follow and start PCN if making progress and close to delivery and still no GBS cx back  #cHTN with superimposed Severe Pre-E: Cont Labetalol 400 mg BID. Treating headache. Cont Mag. Pr/Cr 0.32, CMP WNL, Plt 296.  Julie King 8:59 PM

## 2020-01-31 NOTE — H&P (Signed)
Julie King is a 29 y.o. female presenting for admission for IOL with preeclampsia with severe features.G2P1001 [redacted]w[redacted]d She presented with acute RUQ pain and now has right side headache. Preeclampsia was diagnosed and IOL was for 37 weeks OB History    Gravida  2   Para  1   Term  1   Preterm      AB      Living  1     SAB      TAB      Ectopic      Multiple      Live Births  1          Past Medical History:  Diagnosis Date  . Atrial fibrillation ([place])   . Headache 07/25/2017  . Hypertension   . Irritable bowel syndrome   . Morbidly obese ([place]) 07/25/2017  . QT prolongation   . T wave inversion in EKG    Past Surgical History:  Procedure Laterality Date  . CESAREAN SECTION    . CHOLECYSTECTOMY    . HERNIA REPAIR    . TONSILLECTOMY     Family History: family history includes Breast cancer in her maternal aunt and maternal grandmother; Crohn's disease in her mother; Diabetes in her father and another family member; Hypertension in her mother and another family member; Thyroid disease in her father. Social History:  reports that she has quit smoking. Her smoking use included cigarettes. She has a 5.00 pack-year smoking history. She has never used smokeless tobacco. She reports previous alcohol use. She reports that she does not use drugs.     Maternal Diabetes: No Genetic Screening: Declined Maternal Ultrasounds/Referrals: Normal Fetal Ultrasounds or other Referrals:  None Maternal Substance Abuse:  No Significant Maternal Medications:  Meds include: Other: Labetalol Significant Maternal Lab Results:  None and Other:  Other Comments:  GBS pending  Review of Systems  Gastrointestinal: Positive for abdominal pain (RUQ).  Neurological: Positive for headaches.   History   Blood pressure 138/65, pulse 92, temperature 98.4 F (36.9 C), temperature source Oral, resp. rate 20, height 5\' 7"  (1.702 m), weight 135.2 kg, last menstrual period 06/02/2019, SpO2  99 %. Exam Physical Exam  Constitutional: She appears well-developed. No distress.  HENT:  Head: Normocephalic.  Eyes: Pupils are equal, round, and reactive to light.  Cardiovascular: Normal rate.  Respiratory: Effort normal.  GI:  Obese, Gravid  Musculoskeletal:        General: Normal range of motion.     Cervical back: Normal range of motion.  Neurological: She is alert.  Skin: Skin is warm and dry.  Psychiatric: She has a normal mood and affect. Her behavior is normal.    Prenatal labs: ABO, Rh:    Blood Type   A pos  Antibody  neg  Rubella  Immune  RPR   NR  HBsAg   Neg  HCVAb   HIV     NR  GBS  (For PCN allergy, check sensitivities)   Pap   Hgb Electro  AA  CF   SMA   Waterbirth  [ ]  Class [ ]  Consent [ ]  CNM visit   Antibody:   Rubella:   RPR:    HBsAg:    HIV:    GBS:     Assessment/Plan: CHTN, superimposed preeclampsia with severe features due to headache. Transfer to L&D for IOL, Dr. Rip Harbour aware.   Emeterio Reeve 01/31/2020, 1:07 PM

## 2020-01-31 NOTE — Anesthesia Preprocedure Evaluation (Signed)
Anesthesia Evaluation  Patient identified by MRN, date of birth, ID band Patient awake    Reviewed: Allergy & Precautions, NPO status , Patient's Chart, lab work & pertinent test results, reviewed documented beta blocker date and time   Airway Mallampati: III  TM Distance: >3 FB Neck ROM: Full    Dental no notable dental hx.    Pulmonary asthma , former smoker,    Pulmonary exam normal breath sounds clear to auscultation       Cardiovascular hypertension, Pt. on medications and Pt. on home beta blockers Normal cardiovascular exam+ dysrhythmias (not on anticoagulation) Atrial Fibrillation  Rhythm:Regular Rate:Normal     Neuro/Psych  Headaches, negative psych ROS   GI/Hepatic negative GI ROS, Neg liver ROS,   Endo/Other  Morbid obesity (BMI 47)  Renal/GU negative Renal ROS  negative genitourinary   Musculoskeletal negative musculoskeletal ROS (+)   Abdominal   Peds  Hematology  (+) Blood dyscrasia (Hgb 9.9), anemia ,   Anesthesia Other Findings IOL for cHTN with superimposed preE; however baby found to be transverse/breech, so presenting to OR for repeat C/S  Reproductive/Obstetrics (+) Pregnancy                             Anesthesia Physical Anesthesia Plan  ASA: III  Anesthesia Plan: Spinal   Post-op Pain Management:    Induction:   PONV Risk Score and Plan: Treatment may vary due to age or medical condition  Airway Management Planned: Natural Airway  Additional Equipment:   Intra-op Plan:   Post-operative Plan:   Informed Consent: I have reviewed the patients History and Physical, chart, labs and discussed the procedure including the risks, benefits and alternatives for the proposed anesthesia with the patient or authorized representative who has indicated his/her understanding and acceptance.     Dental advisory given  Plan Discussed with: CRNA  Anesthesia Plan  Comments:         Anesthesia Quick Evaluation

## 2020-01-31 NOTE — MAU Note (Addendum)
I was here yesterday and told I had preeclampsia. For IOL on Tues. This am awoke at 0400 with pain in RUOQ. Pain is sharp and intermittent.  Still feel like I did yesterday with weakness, nausea and just do not feel well. Denies VB or LOF

## 2020-02-01 ENCOUNTER — Encounter (HOSPITAL_COMMUNITY): Payer: Self-pay | Admitting: Obstetrics & Gynecology

## 2020-02-01 LAB — CULTURE, BETA STREP (GROUP B ONLY)

## 2020-02-01 LAB — CBC
HCT: 31.9 % — ABNORMAL LOW (ref 36.0–46.0)
Hemoglobin: 9.9 g/dL — ABNORMAL LOW (ref 12.0–15.0)
MCH: 19.8 pg — ABNORMAL LOW (ref 26.0–34.0)
MCHC: 31 g/dL (ref 30.0–36.0)
MCV: 63.9 fL — ABNORMAL LOW (ref 80.0–100.0)
Platelets: 266 10*3/uL (ref 150–400)
RBC: 4.99 MIL/uL (ref 3.87–5.11)
RDW: 15.5 % (ref 11.5–15.5)
WBC: 15.5 10*3/uL — ABNORMAL HIGH (ref 4.0–10.5)
nRBC: 0 % (ref 0.0–0.2)

## 2020-02-01 MED ORDER — KETOROLAC TROMETHAMINE 30 MG/ML IJ SOLN
30.0000 mg | Freq: Four times a day (QID) | INTRAMUSCULAR | Status: AC
  Administered 2020-02-01 (×4): 30 mg via INTRAVENOUS
  Filled 2020-02-01 (×4): qty 1

## 2020-02-01 MED ORDER — MISOPROSTOL 200 MCG PO TABS
400.0000 ug | ORAL_TABLET | Freq: Once | ORAL | Status: AC
  Administered 2020-02-01: 400 ug via BUCCAL

## 2020-02-01 MED ORDER — MISOPROSTOL 200 MCG PO TABS
ORAL_TABLET | ORAL | Status: AC
  Filled 2020-02-01: qty 2

## 2020-02-01 MED ORDER — MAGNESIUM SULFATE 40 GM/1000ML IV SOLN
2.0000 g/h | INTRAVENOUS | Status: AC
  Administered 2020-02-01 (×2): 2 g/h via INTRAVENOUS
  Filled 2020-02-01: qty 1000

## 2020-02-01 MED ORDER — SIMETHICONE 80 MG PO CHEW
80.0000 mg | CHEWABLE_TABLET | Freq: Three times a day (TID) | ORAL | Status: DC
  Administered 2020-02-01 – 2020-02-03 (×6): 80 mg via ORAL
  Filled 2020-02-01 (×5): qty 1

## 2020-02-01 MED ORDER — HYDROCODONE-ACETAMINOPHEN 5-325 MG PO TABS: 1.0000 | ORAL_TABLET | ORAL | Status: DC | PRN

## 2020-02-01 MED ORDER — ACETAMINOPHEN 325 MG PO TABS
650.0000 mg | ORAL_TABLET | Freq: Four times a day (QID) | ORAL | Status: DC | PRN
  Administered 2020-02-01 – 2020-02-03 (×5): 650 mg via ORAL
  Filled 2020-02-01 (×5): qty 2

## 2020-02-01 MED ORDER — SIMETHICONE 80 MG PO CHEW
80.0000 mg | CHEWABLE_TABLET | ORAL | Status: DC
  Administered 2020-02-01 – 2020-02-02 (×2): 80 mg via ORAL
  Filled 2020-02-01 (×2): qty 1

## 2020-02-01 MED ORDER — BUPIVACAINE IN DEXTROSE 0.75-8.25 % IT SOLN
INTRATHECAL | Status: DC | PRN
  Administered 2020-01-31: 1.6 mL via INTRATHECAL

## 2020-02-01 MED ORDER — OXYTOCIN-SODIUM CHLORIDE 30-0.9 UT/500ML-% IV SOLN: 2.5000 [IU]/h | INTRAVENOUS | Status: AC

## 2020-02-01 MED ORDER — FENTANYL CITRATE (PF) 100 MCG/2ML IJ SOLN
INTRAMUSCULAR | Status: AC
  Filled 2020-02-01: qty 2

## 2020-02-01 MED ORDER — FENTANYL CITRATE (PF) 100 MCG/2ML IJ SOLN
100.0000 ug | Freq: Once | INTRAMUSCULAR | Status: AC
  Administered 2020-02-01: 100 ug via INTRAVENOUS

## 2020-02-01 MED ORDER — LACTATED RINGERS IV SOLN: INTRAVENOUS | Status: DC

## 2020-02-01 MED ORDER — MENTHOL 3 MG MT LOZG: 1.0000 | LOZENGE | OROMUCOSAL | Status: DC | PRN

## 2020-02-01 MED ORDER — WITCH HAZEL-GLYCERIN EX PADS: 1.0000 "application " | MEDICATED_PAD | CUTANEOUS | Status: DC | PRN

## 2020-02-01 MED ORDER — KETOROLAC TROMETHAMINE 30 MG/ML IJ SOLN
INTRAMUSCULAR | Status: DC | PRN
Start: 2020-02-01 — End: 2020-02-01
  Administered 2020-02-01: 30 mg via INTRAVENOUS

## 2020-02-01 MED ORDER — DIBUCAINE (PERIANAL) 1 % EX OINT: 1.0000 "application " | TOPICAL_OINTMENT | CUTANEOUS | Status: DC | PRN

## 2020-02-01 MED ORDER — SIMETHICONE 80 MG PO CHEW: 80.0000 mg | CHEWABLE_TABLET | ORAL | Status: DC | PRN

## 2020-02-01 MED ORDER — METHYLERGONOVINE MALEATE 0.2 MG/ML IJ SOLN
INTRAMUSCULAR | Status: AC
  Filled 2020-02-01: qty 1

## 2020-02-01 MED ORDER — METHYLERGONOVINE MALEATE 0.2 MG/ML IJ SOLN
0.2000 mg | Freq: Once | INTRAMUSCULAR | Status: AC
  Administered 2020-02-01: 0.2 mg via INTRAMUSCULAR

## 2020-02-01 MED ORDER — SENNOSIDES-DOCUSATE SODIUM 8.6-50 MG PO TABS
2.0000 | ORAL_TABLET | ORAL | Status: DC
  Administered 2020-02-01 – 2020-02-02 (×2): 2 via ORAL
  Filled 2020-02-01 (×2): qty 2

## 2020-02-01 MED ORDER — MORPHINE SULFATE (PF) 0.5 MG/ML IJ SOLN
INTRAMUSCULAR | Status: DC | PRN
  Administered 2020-01-31: .15 mg via INTRATHECAL

## 2020-02-01 MED ORDER — FENTANYL CITRATE (PF) 100 MCG/2ML IJ SOLN
INTRAMUSCULAR | Status: DC | PRN
  Administered 2020-01-31: 15 ug via INTRATHECAL

## 2020-02-01 MED ORDER — KETOROLAC TROMETHAMINE 30 MG/ML IJ SOLN
INTRAMUSCULAR | Status: AC
  Filled 2020-02-01: qty 1

## 2020-02-01 MED ORDER — PRENATAL MULTIVITAMIN CH
1.0000 | ORAL_TABLET | Freq: Every day | ORAL | Status: DC
  Administered 2020-02-02: 1 via ORAL
  Filled 2020-02-01: qty 1

## 2020-02-01 MED ORDER — COCONUT OIL OIL: 1.0000 "application " | TOPICAL_OIL | Status: DC | PRN

## 2020-02-01 MED ORDER — DIPHENHYDRAMINE HCL 25 MG PO CAPS: 25.0000 mg | ORAL_CAPSULE | Freq: Four times a day (QID) | ORAL | Status: DC | PRN

## 2020-02-01 MED ORDER — ENOXAPARIN SODIUM 80 MG/0.8ML ~~LOC~~ SOLN
0.5000 mg/kg | SUBCUTANEOUS | Status: DC
  Administered 2020-02-02 – 2020-02-03 (×2): 70 mg via SUBCUTANEOUS
  Filled 2020-02-01 (×2): qty 0.8

## 2020-02-01 MED ORDER — IBUPROFEN 800 MG PO TABS
800.0000 mg | ORAL_TABLET | Freq: Three times a day (TID) | ORAL | Status: DC
  Administered 2020-02-02: 800 mg via ORAL
  Filled 2020-02-01: qty 1

## 2020-02-01 NOTE — Anesthesia Postprocedure Evaluation (Signed)
Anesthesia Post Note  Patient: Julie King  Procedure(s) Performed: CESAREAN SECTION (N/A )     Patient location during evaluation: PACU Anesthesia Type: Spinal Level of consciousness: oriented and awake and alert Pain management: pain level controlled Vital Signs Assessment: post-procedure vital signs reviewed and stable Respiratory status: spontaneous breathing, respiratory function stable and patient connected to nasal cannula oxygen Cardiovascular status: blood pressure returned to baseline and stable Postop Assessment: no headache, no backache and no apparent nausea or vomiting Anesthetic complications: no    Last Vitals:  Vitals:   02/01/20 0200 02/01/20 0215  BP: 112/77 113/64  Pulse: 84 90  Resp: 16 16  Temp:  37.2 C  SpO2: 100% 97%    Last Pain:  Vitals:   02/01/20 0215  TempSrc:   PainSc: 3    Pain Goal: Patients Stated Pain Goal: 0 (01/31/20 0602)  LLE Motor Response: Purposeful movement (02/01/20 0215) LLE Sensation: Increased (02/01/20 0215) RLE Motor Response: Purposeful movement (02/01/20 0215) RLE Sensation: Increased (02/01/20 0215)     Epidural/Spinal Function Cutaneous sensation: Tingles (02/01/20 0215), Patient able to flex knees: Yes (02/01/20 0215), Patient able to lift hips off bed: Yes (02/01/20 0215), Back pain beyond tenderness at insertion site: No (02/01/20 0215), Progressively worsening motor and/or sensory loss: No (02/01/20 0215), Bowel and/or bladder incontinence post epidural: No (02/01/20 0215)  [place]

## 2020-02-01 NOTE — Transfer of Care (Signed)
Immediate Anesthesia Transfer of Care Note  Patient: Julie King  Procedure(s) Performed: CESAREAN SECTION (N/A )  Patient Location: PACU  Anesthesia Type:Spinal  Level of Consciousness: awake, alert  and oriented  Airway & Oxygen Therapy: Patient Spontanous Breathing  Post-op Assessment: Report given to RN and Post -op Vital signs reviewed and stable  Post vital signs: Reviewed and stable  Last Vitals:  Vitals Value Taken Time  BP    Temp 36.6 C 02/01/20 0050  Pulse    Resp    SpO2      Last Pain:  Vitals:   02/01/20 0050  TempSrc: Oral  PainSc:       Patients Stated Pain Goal: 0 (53/74/82 7078)  Complications: No apparent anesthesia complications

## 2020-02-01 NOTE — Anesthesia Procedure Notes (Signed)
Spinal  Patient location during procedure: OR Start time: 02/01/2020 11:15 PM End time: 02/01/2020 11:22 PM Staffing Performed: anesthesiologist  Anesthesiologist: Freddrick March, MD Preanesthetic Checklist Completed: patient identified, IV checked, risks and benefits discussed, surgical consent, monitors and equipment checked, pre-op evaluation and timeout performed Spinal Block Patient position: sitting Prep: DuraPrep and site prepped and draped Patient monitoring: cardiac monitor, continuous pulse ox and blood pressure Approach: midline Location: L3-4 Injection technique: single-shot Needle Needle type: Pencan  Needle gauge: 24 G Needle length: 9 cm Assessment Sensory level: T6 Additional Notes Functioning IV was confirmed and monitors were applied. Sterile prep and drape, including hand hygiene and sterile gloves were used. The patient was positioned and the spine was prepped. The skin was anesthetized with lidocaine.  Free flow of clear CSF was obtained prior to injecting local anesthetic into the CSF.  The spinal needle aspirated freely following injection.  The needle was carefully withdrawn.  The patient tolerated the procedure well.

## 2020-02-01 NOTE — Progress Notes (Signed)
Called to bedside to assess bleeding.  Had previously been called by RN with note of some clots, given 437mcg buccal misoprostol.  On my exam had small clot on pad, lower uterine segment assessed as best I could with cervix only 1.5cm dilated but no significant clot burden felt. Soft BP's at present, will give dose of IM methergine. Total additional loss from clots 200cc at present. Also given dose of 150mcg IV fentanyl for pain after these procedures.

## 2020-02-01 NOTE — Discharge Summary (Signed)
Postpartum Discharge Summary      Patient Name: Julie King DOB: [birth year] MRN: [id number]  Date of admission: 01/31/2020 Delivery date:01/31/2020  Delivering provider: Chancy Milroy  Date of discharge: 02/03/2020  Admitting diagnosis: Preeclampsia, third trimester [O14.93] Chronic hypertension with superimposed pre-eclampsia [O11.9] Intrauterine pregnancy: [redacted]w[redacted]d    Secondary diagnosis:  Active Problems:   Morbidly obese ([place]   Supervision of other normal pregnancy, antepartum   Anemia in pregnancy, third trimester   Previous cesarean delivery affecting pregnancy   Chronic hypertension affecting pregnancy   Maternal obesity affecting pregnancy, antepartum   Chronic hypertension with superimposed preeclampsia   Severe preeclampsia   Chronic hypertension with superimposed pre-eclampsia  Additional problems: n/a    Discharge diagnosis: Preterm Pregnancy Delivered and CHTN with superimposed preeclampsia                                              Post partum procedures:n/a Augmentation: Pitocin and IP Foley Complications: None  Hospital course: Induction of Labor With Cesarean Section   29y.o. yo G2P1001 at [id number] admitted to the hospital 01/31/2020 for induction of labor. Patient had a labor course significant for: Patient initially admitted for induction and TOLAC secondary to pre-eclampsia with severe features by neurological symptoms. Induction started with foley bulb and pitocin due to history of prior cesarean. During induction patient was found to be breech by ultrasound, and after deliberation decided to have a repeat cesarean.. The patient went for cesarean section due to Malpresentation. Delivery details are as follows: Membrane Rupture Time/Date: 11:49 PM ,01/31/2020   Delivery Method:C-Section, Low Transverse  Details of operation can be found in separate operative Note.  Patient had an uncomplicated postpartum course. BP's postpartum were normal. She is  ambulating, tolerating a regular diet, passing flatus, and urinating well.  Patient is discharged home in stable condition on 02/03/20.      Newborn Data: Birth date:01/31/2020  Birth time:11:52 PM  Gender:Female  Living status:Living  Apgars:1 ,9  Weight:2950 g                                 Magnesium Sulfate received: Yes: Seizure prophylaxis BMZ received: No Rhophylac:N/A MMR:N/A T-DaP:Given prenatally Flu: No Transfusion:No  Physical exam  Vitals:   02/02/20 1604 02/02/20 1952 02/02/20 2303 02/03/20 0507  BP: 120/63 (!) 119/57 126/64 137/83  Pulse: 91 98 90 (!) 105  Resp: _0 Temp: 97.9 F (36.6 C)  98.7 F (37.1 C) 98.4 F (36.9 C)  TempSrc: Oral  Oral Oral  SpO2: 100% 99% 99% 100%  Weight:      Height:       General: alert, cooperative and no distress Lochia: appropriate Uterine Fundus: firm Incision: Dressing is clean, dry, and intact DVT Evaluation: No evidence of DVT seen on physical exam. Labs: Lab Results  Component Value Date   WBC 15.5 (H) 02/01/2020   HGB 9.9 (L) 02/01/2020   HCT 31.9 (L) 02/01/2020   MCV 63.9 (L) 02/01/2020   PLT 266 02/01/2020   CMP Latest Ref Rng & Units 01/31/2020  Glucose 70 - 99 mg/dL 102(H)  BUN 6 - 20 mg/dL 5(L)  Creatinine 0.44 - 1.00 mg/dL 0.67  Sodium 135 - 145 mmol/L 136  Potassium 3.5 - 5.1 mmol/L  3.5  Chloride 98 - 111 mmol/L 106  CO2 22 - 32 mmol/L 19(L)  Calcium 8.9 - 10.3 mg/dL 8.7(L)  Total Protein 6.5 - 8.1 g/dL 6.5  Total Bilirubin 0.3 - 1.2 mg/dL 0.7  Alkaline Phos 38 - 126 U/L 117  AST 15 - 41 U/L 17  ALT 0 - 44 U/L 19   Edinburgh Score: No flowsheet data found.   After visit meds:  Allergies as of 02/03/2020      Reactions   Percocet [oxycodone-acetaminophen] Hives, Itching, Nausea Only   Pineapple Itching   Fresh pineapples; pineapples from a can is okay      Medication List    STOP taking these medications   aspirin EC 81 MG tablet   Cetirizine HCl 10 MG Caps   cyclobenzaprine  10 MG tablet Commonly known as: FLEXERIL   ferrous sulfate 325 (65 FE) MG tablet   labetalol 200 MG tablet Commonly known as: NORMODYNE     TAKE these medications   albuterol 108 (90 Base) MCG/ACT inhaler Commonly known as: VENTOLIN HFA Inhale 2 puffs into the lungs every 6 (six) hours as needed for wheezing or shortness of breath.   ibuprofen 600 MG tablet Commonly known as: ADVIL Take 1 tablet (600 mg total) by mouth every 6 (six) hours as needed.   prenatal multivitamin Tabs tablet Take 1 tablet by mouth daily at 12 noon.   traMADol 50 MG tablet Commonly known as: ULTRAM Take 1 tablet (50 mg total) by mouth every 6 (six) hours.        Discharge home in stable condition Infant Feeding: No evidence of DVT seen on physical exam. Infant Disposition:Surrogate pregnancy, d/c to adoptive parents Discharge instruction: per After Visit Summary and Postpartum booklet. Activity: Advance as tolerated. Pelvic rest for 6 weeks.  Diet: routine diet Future Appointments: Future Appointments  Date Time Provider [place]  02/06/2020 11:00 AM [facility] NURSE [facility]  02/06/2020 11:00 AM [facility] US1 [facility] [place]   Follow up Visit: Follow-up [place]. Schedule an appointment as soon as possible for a visit in 1 week(s).   Specialty: Obstetrics and Gynecology Contact information: [address] [place] [phone]           Please schedule this patient for a In person postpartum visit in 6 weeks with the following provider: Any provider. Additional Postpartum F/U:Incision check 1 week and BP check 1 week  High risk pregnancy complicated by: HTN Delivery mode:  C-Section, Low Transverse  Anticipated Birth Control:  None   02/03/2020 Sloan Leiter, MD

## 2020-02-01 NOTE — Progress Notes (Addendum)
Subjective: Postpartum Day 1: Cesarean Delivery Patient reports incisional pain and tolerating PO.    Objective: Vital signs in last 24 hours: Temp:  [97.7 F (36.5 C)-99 F (37.2 C)] 99 F (37.2 C) (06/05 0235) Pulse Rate:  [81-109] 84 (06/05 0900) Resp:  [16-20] 17 (06/05 1059) BP: (82-142)/(53-77) 119/58 (06/05 1011) SpO2:  [96 %-100 %] 97 % (06/05 0900)  Physical Exam:  General: alert, cooperative and appears stated age Lochia: appropriate Uterine Fundus: firm Incision: healing well DVT Evaluation: No evidence of DVT seen on physical exam.  Recent Labs    01/31/20 0648 02/01/20 0628  HGB 9.9* 9.9*  HCT 32.0* 31.9*    Assessment/Plan: Status post Cesarean section. Doing well postoperatively.  Magnesium x 24 hours BP is well controlled today Discontinue foley Continue current care.  Julie King 02/01/2020, 11:41 AM

## 2020-02-01 NOTE — Op Note (Signed)
Cesarean Section Procedure Note  01/31/2020 - 02/01/2020  12:34 AM  PATIENT:  Julie King  29 y.o. female  PRE-OPERATIVE DIAGNOSIS:  Unscheduled Cesarean Section, pregnancy at [redacted] weeks gestation, pre-eclampsia with severe features, malpresentation: breech, history of one prior cesarean section  POST-OPERATIVE DIAGNOSIS: The same  PROCEDURE:  Procedure(s): CESAREAN SECTION (N/A)  SURGEON:  Surgeon(s) and Role:    * Chancy Milroy, MD - Primary    * Clarnce Flock, MD - Fellow  ASSISTANTS: Nile Dear, MS3   ANESTHESIA:   spinal  EBL:  Total I/O In: 1591.8 [P.O.:580; I.V.:1011.8] Out: 1829 [Urine:750; Blood:618]  BLOOD ADMINISTERED:none  DRAINS: none   LOCAL MEDICATIONS USED:  NONE  SPECIMEN:  No Specimen  DISPOSITION OF SPECIMEN:  N/A   Procedure Details  Patient initially admitted for induction and TOLAC secondary to pre-eclampsia with severe features by neurological symptoms. Induction started with foley bulb and pitocin due to history of prior cesarean. During induction patient was found to be breech by ultrasound, and after deliberation decided to have a repeat cesarean.  The risks, benefits, complications, treatment options, and expected outcomes were discussed with the patient.  The patient concurred with the proposed plan, giving informed consent.  The site of surgery properly noted/marked. The patient was taken to the operating room, identified as Julie King and the procedure verified as C-Section Delivery. A Time Out was held and the above information confirmed.  After induction of anesthesia, the patient was draped and prepped in the usual sterile manner. A Pfannenstiel incision was made and carried down through the subcutaneous tissue to the fascia. Fascial incision was made and extended transversely. The fascia was separated from the underlying rectus tissue superiorly and inferiorly. The peritoneum was identified and entered. Peritoneal incision  was extended longitudinally. A low transverse uterine incision was made. Initially the R leg was delivered and a hand was also protruding. The hand was replaced and multiple attempts made to delivery the L leg which were unsuccessful at which point the hysterotomy was extended with bandage scissors to the L side while still remaining in the lower uterine segment. Subsequently the second leg was delivered followed the rest of the infant in usual fashion. Delivered from breech back down presentation was a 2950 gram Female with Apgar scores of 1 at one minute and 9 at five minutes. After the umbilical cord was clamped and cut cord blood was obtained for evaluation. The placenta was removed intact and appeared normal. The uterine outline, tubes and ovaries appeared normal. The uterine incision was closed with running locked sutures of Chromic. An imbricating layer was placed, and several figure of eight stitches. Hemostasis was observed. Lavage was carried out until clear. The peritoneum and rectus muscles were then reapproximated with running sutures of Vicryl. The fascia was then reapproximated with running sutures of Vicryl. The subcuticular space was closed with running sutures of Vicryl. The skin was reapproximated with Vicryl.  Instrument, sponge, and needle counts were correct prior the abdominal closure and at the conclusion of the case.   Complications:  None; patient tolerated the procedure well.  COUNTS:  Correct  PLAN OF CARE: Routine post partum care  PATIENT DISPOSITION:  PACU - hemodynamically stable.   Delay start of Pharmacological VTE agent (>24hrs) due to surgical blood loss or risk of bleeding: no   Clarnce Flock, MD 02/01/2020 12:34 AM

## 2020-02-02 ENCOUNTER — Encounter (HOSPITAL_COMMUNITY): Payer: Self-pay | Admitting: Obstetrics & Gynecology

## 2020-02-02 LAB — RPR: RPR Ser Ql: NONREACTIVE

## 2020-02-02 MED ORDER — CALCIUM CARBONATE ANTACID 500 MG PO CHEW
1.0000 | CHEWABLE_TABLET | ORAL | Status: DC | PRN
  Administered 2020-02-02: 200 mg via ORAL
  Filled 2020-02-02: qty 1

## 2020-02-02 MED ORDER — TRAMADOL HCL 50 MG PO TABS
50.0000 mg | ORAL_TABLET | Freq: Four times a day (QID) | ORAL | Status: DC
  Administered 2020-02-02 – 2020-02-03 (×3): 50 mg via ORAL
  Filled 2020-02-02 (×3): qty 1

## 2020-02-02 MED ORDER — KETOROLAC TROMETHAMINE 30 MG/ML IJ SOLN: 30.0000 mg | Freq: Four times a day (QID) | INTRAMUSCULAR | Status: DC | PRN

## 2020-02-02 MED ORDER — PANTOPRAZOLE SODIUM 40 MG PO TBEC
40.0000 mg | DELAYED_RELEASE_TABLET | Freq: Every day | ORAL | Status: DC
  Administered 2020-02-02 – 2020-02-03 (×2): 40 mg via ORAL
  Filled 2020-02-02 (×2): qty 1

## 2020-02-02 MED ORDER — IBUPROFEN 800 MG PO TABS
800.0000 mg | ORAL_TABLET | Freq: Three times a day (TID) | ORAL | Status: DC
  Administered 2020-02-02 – 2020-02-03 (×3): 800 mg via ORAL
  Filled 2020-02-02 (×3): qty 1

## 2020-02-02 NOTE — Progress Notes (Signed)
Subjective: Postpartum Day 1: Cesarean Delivery Patient reports nausea.  Also with no flatus and diffuse abdominal pain and headache. BP are very low on Labetalol 400 bid  Objective: Vital signs in last 24 hours: Temp:  [98.6 F (37 C)] 98.6 F (37 C) (06/06 0026) Pulse Rate:  [78-99] 78 (06/06 0410) Resp:  [16-20] 20 (06/06 0410) BP: (93-119)/(45-67) 99/55 (06/06 0410) SpO2:  [96 %-100 %] 98 % (06/06 0410)  Physical Exam:  General: alert, cooperative and appears stated age Lochia: appropriate Uterine Fundus: firm Incision: no significant drainage DVT Evaluation: No evidence of DVT seen on physical exam.  Recent Labs    01/31/20 0648 02/01/20 0628  HGB 9.9* 9.9*  HCT 32.0* 31.9*    Assessment/Plan: Status post Cesarean section. Doing well postoperatively.  Up and walking today. Hold labetalol Add tramadol for pain--she does not like how the narcotics make her feel.  Julie King 02/02/2020, 8:04 AM

## 2020-02-03 ENCOUNTER — Encounter (HOSPITAL_COMMUNITY): Payer: Self-pay | Admitting: Obstetrics & Gynecology

## 2020-02-03 MED ORDER — IBUPROFEN 600 MG PO TABS
600.0000 mg | ORAL_TABLET | Freq: Four times a day (QID) | ORAL | 1 refills | Status: DC | PRN
Start: 2020-02-03 — End: 2020-11-11

## 2020-02-03 MED ORDER — TRAMADOL HCL 50 MG PO TABS: 50.0000 mg | ORAL_TABLET | Freq: Four times a day (QID) | ORAL | 0 refills | Status: DC

## 2020-02-03 NOTE — Discharge Instructions (Signed)
Cesarean Delivery, Care After This sheet gives you information about how to care for yourself after your procedure. Your health care provider may also give you more specific instructions. If you have problems or questions, contact your health care provider. What can I expect after the procedure? After the procedure, it is common to have:  A small amount of blood or clear fluid coming from the incision.  Some redness, swelling, and pain in your incision area.  Some abdominal pain and soreness.  Vaginal bleeding (lochia). Even though you did not have a vaginal delivery, you will still have vaginal bleeding and discharge.  Pelvic cramps.  Fatigue. You may have pain, swelling, and discomfort in the tissue between your vagina and your anus (perineum) if:  Your C-section was unplanned, and you were allowed to labor and push.  An incision was made in the area (episiotomy) or the tissue tore during attempted vaginal delivery. Follow these instructions at home: Incision care   Follow instructions from your health care provider about how to take care of your incision. Make sure you: ? Wash your hands with soap and water before you change your bandage (dressing). If soap and water are not available, use hand sanitizer. ? If you have a dressing, change it or remove it as told by your health care provider. ? Leave stitches (sutures), skin staples, skin glue, or adhesive strips in place. These skin closures may need to stay in place for 2 weeks or longer. If adhesive strip edges start to loosen and curl up, you may trim the loose edges. Do not remove adhesive strips completely unless your health care provider tells you to do that.  Check your incision area every day for signs of infection. Check for: ? More redness, swelling, or pain. ? More fluid or blood. ? Warmth. ? Pus or a bad smell.  Do not take baths, swim, or use a hot tub until your health care provider says it's okay. Ask your health  care provider if you can take showers.  When you cough or sneeze, hug a pillow. This helps with pain and decreases the chance of your incision opening up (dehiscing). Do this until your incision heals. Medicines  Take over-the-counter and prescription medicines only as told by your health care provider.  If you were prescribed an antibiotic medicine, take it as told by your health care provider. Do not stop taking the antibiotic even if you start to feel better.  Do not drive or use heavy machinery while taking prescription pain medicine. Lifestyle  Do not drink alcohol. This is especially important if you are breastfeeding or taking pain medicine.  Do not use any products that contain nicotine or tobacco, such as cigarettes, e-cigarettes, and chewing tobacco. If you need help quitting, ask your health care provider. Eating and drinking  Drink at least 8 eight-ounce glasses of water every day unless told not to by your health care provider. If you breastfeed, you may need to drink even more water.  Eat high-fiber foods every day. These foods may help prevent or relieve constipation. High-fiber foods include: ? Whole grain cereals and breads. ? Brown rice. ? Beans. ? Fresh fruits and vegetables. Activity   If possible, have someone help you care for your baby and help with household activities for at least a few days after you leave the hospital.  Return to your normal activities as told by your health care provider. Ask your health care provider what activities are safe for   you.  Rest as much as possible. Try to rest or take a nap while your baby is sleeping.  Do not lift anything that is heavier than 10 lbs (4.5 kg), or the limit that you were told, until your health care provider says that it is safe.  Talk with your health care provider about when you can engage in sexual activity. This may depend on your: ? Risk of infection. ? How fast you heal. ? Comfort and desire to  engage in sexual activity. General instructions  Do not use tampons or douches until your health care provider approves.  Wear loose, comfortable clothing and a supportive and well-fitting bra.  Keep your perineum clean and dry. Wipe from front to back when you use the toilet.  If you pass a blood clot, save it and call your health care provider to discuss. Do not flush blood clots down the toilet before you get instructions from your health care provider.  Keep all follow-up visits for you and your baby as told by your health care provider. This is important. Contact a health care provider if:  You have: ? A fever. ? Bad-smelling vaginal discharge. ? Pus or a bad smell coming from your incision. ? Difficulty or pain when urinating. ? A sudden increase or decrease in the frequency of your bowel movements. ? More redness, swelling, or pain around your incision. ? More fluid or blood coming from your incision. ? A rash. ? Nausea. ? Little or no interest in activities you used to enjoy. ? Questions about caring for yourself or your baby.  Your incision feels warm to the touch.  Your breasts turn red or become painful or hard.  You feel unusually sad or worried.  You vomit.  You pass a blood clot from your vagina.  You urinate more than usual.  You are dizzy or light-headed. Get help right away if:  You have: ? Pain that does not go away or get better with medicine. ? Chest pain. ? Difficulty breathing. ? Blurred vision or spots in your vision. ? Thoughts about hurting yourself or your baby. ? New pain in your abdomen or in one of your legs. ? A severe headache.  You faint.  You bleed from your vagina so much that you fill more than one sanitary pad in one hour. Bleeding should not be heavier than your heaviest period. Summary  After the procedure, it is common to have pain at your incision site, abdominal cramping, and slight bleeding from your vagina.  Check  your incision area every day for signs of infection.  Tell your health care provider about any unusual symptoms.  Keep all follow-up visits for you and your baby as told by your health care provider. This information is not intended to replace advice given to you by your health care provider. Make sure you discuss any questions you have with your health care provider. Document Revised: 02/21/2018 Document Reviewed: 02/21/2018 Elsevier Patient Education  2020 [employer] Inc[employer]  

## 2020-02-04 ENCOUNTER — Other Ambulatory Visit: Payer: Self-pay

## 2020-02-04 ENCOUNTER — Telehealth: Payer: Self-pay

## 2020-02-04 ENCOUNTER — Inpatient Hospital Stay (HOSPITAL_COMMUNITY)
Admission: AD | Admit: 2020-02-04 | Discharge: 2020-02-04 | Disposition: A | Discharge status: Home / Self Care | Payer: BC Managed Care – PPO | Attending: Family Medicine | Admitting: Family Medicine

## 2020-02-04 DIAGNOSIS — Z8249 Family history of ischemic heart disease and other diseases of the circulatory system: Secondary | ICD-10-CM | POA: Diagnosis not present

## 2020-02-04 DIAGNOSIS — O99893 Other specified diseases and conditions complicating puerperium: Secondary | ICD-10-CM | POA: Diagnosis present

## 2020-02-04 DIAGNOSIS — R109 Unspecified abdominal pain: Secondary | ICD-10-CM | POA: Diagnosis not present

## 2020-02-04 DIAGNOSIS — I10 Essential (primary) hypertension: Secondary | ICD-10-CM | POA: Diagnosis not present

## 2020-02-04 DIAGNOSIS — N3289 Other specified disorders of bladder: Secondary | ICD-10-CM

## 2020-02-04 DIAGNOSIS — Z98891 History of uterine scar from previous surgery: Secondary | ICD-10-CM

## 2020-02-04 DIAGNOSIS — R319 Hematuria, unspecified: Secondary | ICD-10-CM | POA: Insufficient documentation

## 2020-02-04 DIAGNOSIS — I4891 Unspecified atrial fibrillation: Secondary | ICD-10-CM | POA: Insufficient documentation

## 2020-02-04 DIAGNOSIS — Z87891 Personal history of nicotine dependence: Secondary | ICD-10-CM | POA: Diagnosis not present

## 2020-02-04 DIAGNOSIS — M549 Dorsalgia, unspecified: Secondary | ICD-10-CM | POA: Insufficient documentation

## 2020-02-04 DIAGNOSIS — Z885 Allergy status to narcotic agent status: Secondary | ICD-10-CM | POA: Insufficient documentation

## 2020-02-04 DIAGNOSIS — M7989 Other specified soft tissue disorders: Secondary | ICD-10-CM | POA: Insufficient documentation

## 2020-02-04 LAB — SURGICAL PATHOLOGY

## 2020-02-04 LAB — COMPREHENSIVE METABOLIC PANEL
ALT: 18 U/L (ref 0–44)
AST: 18 U/L (ref 15–41)
Albumin: 2.5 g/dL — ABNORMAL LOW (ref 3.5–5.0)
Alkaline Phosphatase: 97 U/L (ref 38–126)
Anion gap: 8 (ref 5–15)
BUN: 8 mg/dL (ref 6–20)
CO2: 24 mmol/L (ref 22–32)
Calcium: 9 mg/dL (ref 8.9–10.3)
Chloride: 109 mmol/L (ref 98–111)
Creatinine, Ser: 0.66 mg/dL (ref 0.44–1.00)
GFR calc Af Amer: >60 mL/min (ref >60)
GFR calc non Af Amer: >60 mL/min (ref >60)
Glucose, Bld: 87 mg/dL (ref 70–99)
Potassium: 4.8 mmol/L (ref 3.5–5.1)
Sodium: 141 mmol/L (ref 135–145)
Total Bilirubin: 0.2 mg/dL — ABNORMAL LOW (ref 0.3–1.2)
Total Protein: 6.6 g/dL (ref 6.5–8.1)

## 2020-02-04 LAB — URINALYSIS, ROUTINE W REFLEX MICROSCOPIC
Bacteria, UA: NONE SEEN
Bilirubin Urine: NEGATIVE
Glucose, UA: NEGATIVE mg/dL
Ketones, ur: NEGATIVE mg/dL
Leukocytes,Ua: NEGATIVE
Nitrite: NEGATIVE
Protein, ur: 30 mg/dL — AB
Specific Gravity, Urine: 1.038 — ABNORMAL HIGH (ref 1.005–1.030)
pH: 5 (ref 5.0–8.0)

## 2020-02-04 LAB — CBC
HCT: 29.2 % — ABNORMAL LOW (ref 36.0–46.0)
Hemoglobin: 9.1 g/dL — ABNORMAL LOW (ref 12.0–15.0)
MCH: 20 pg — ABNORMAL LOW (ref 26.0–34.0)
MCHC: 31.2 g/dL (ref 30.0–36.0)
MCV: 64.2 fL — ABNORMAL LOW (ref 80.0–100.0)
Platelets: 332 10*3/uL (ref 150–400)
RBC: 4.55 MIL/uL (ref 3.87–5.11)
RDW: 15.3 % (ref 11.5–15.5)
WBC: 9.4 10*3/uL (ref 4.0–10.5)
nRBC: 0.2 % (ref 0.0–0.2)

## 2020-02-04 MED ORDER — PHENAZOPYRIDINE HCL 100 MG PO TABS
100.0000 mg | ORAL_TABLET | Freq: Three times a day (TID) | ORAL | 0 refills | Status: DC | PRN
Start: 2020-02-04 — End: 2020-03-20

## 2020-02-04 NOTE — Telephone Encounter (Signed)
Pt called and reports her feet are swollen and she is experiencing hematuria and lower back pain that is getting worse. Pt denies HA or abdominal pain. I advised pt go to the hospital for evaluation, pt voices understanding.

## 2020-02-04 NOTE — MAU Provider Note (Addendum)
History     CSN: [id number]  Arrival date and time: 02/04/20 1741   None     Chief Complaint  Patient presents with  . Foot Swelling  . Flank Pain  . Dysuria  . Hematuria   HPI Patient 3 days postpartum from a cesarean section.  She presents today with increased swelling in her feet still since yesterday, along with pain in her back.  She also states that she does not have the sensation to urinate.  She has been making herself in the bathroom on a regular schedule.  No palliating or provoking factors.  She has been taking tramadol and Tylenol.  OB History    Gravida  2   Para  2   Term  1   Preterm  1   AB      Living  2     SAB      TAB      Ectopic      Multiple  0   Live Births  2           Past Medical History:  Diagnosis Date  . Atrial fibrillation ([place])   . Headache 07/25/2017  . Hypertension   . Irritable bowel syndrome   . Morbidly obese ([place]) 07/25/2017  . QT prolongation   . T wave inversion in EKG     Past Surgical History:  Procedure Laterality Date  . CESAREAN SECTION    . CESAREAN SECTION N/A 01/31/2020   Procedure: CESAREAN SECTION;  Surgeon: Chancy Milroy, MD;  Location: [facility] LD ORS;  Service: Obstetrics;  Laterality: N/A;  . CHOLECYSTECTOMY    . HERNIA REPAIR    . TONSILLECTOMY      Family History  Problem Relation Age of Onset  . Hypertension Other   . Diabetes Other   . Crohn's disease Mother   . Hypertension Mother   . Thyroid disease Father   . Diabetes Father   . Breast cancer Maternal Aunt        late 76 early 74s  . Breast cancer Maternal Grandmother        late 75s early 38    Social History   Tobacco Use  . Smoking status: Former Smoker    Packs/day: 0.50    Years: 10.00    Pack years: 5.00    Types: Cigarettes  . Smokeless tobacco: Never Used  . Tobacco comment: wants to quit but states very difficult Jan 2020  Substance Use Topics  . Alcohol use: Not Currently  . Drug use: No    Allergies:   Allergies  Allergen Reactions  . Percocet [Oxycodone-Acetaminophen] Hives, Itching and Nausea Only  . Pineapple Itching    Fresh pineapples; pineapples from a can is okay    Medications Prior to Admission  Medication Sig Dispense Refill Last Dose  . albuterol (PROVENTIL HFA;VENTOLIN HFA) 108 (90 Base) MCG/ACT inhaler Inhale 2 puffs into the lungs every 6 (six) hours as needed for wheezing or shortness of breath. 1 Inhaler 5   . ibuprofen (ADVIL) 600 MG tablet Take 1 tablet (600 mg total) by mouth every 6 (six) hours as needed. 30 tablet 1   . Prenatal Vit-Fe Fumarate-FA (PRENATAL MULTIVITAMIN) TABS tablet Take 1 tablet by mouth daily at 12 noon.     . traMADol (ULTRAM) 50 MG tablet Take 1 tablet (50 mg total) by mouth every 6 (six) hours. 30 tablet 0     Review of Systems Physical Exam   Blood  pressure 136/77, pulse 87, temperature 99 F (37.2 C), temperature source Oral, resp. rate 20, weight 132.9 kg, SpO2 99 %, currently breastfeeding.  Physical Exam  Nursing note and vitals reviewed. Constitutional: She appears well-developed and well-nourished.  Cardiovascular: Normal rate.  Respiratory: Effort normal and breath sounds normal.  GI: Soft. Bowel sounds are normal. She exhibits no distension. There is abdominal tenderness. There is no rebound and no guarding.  Honeycomb dressing on incision. Otherwise clean dry and intact.  Skin: Skin is warm and dry.  Psychiatric: She has a normal mood and affect. Her behavior is normal. Judgment and thought content normal.   Results for orders placed or performed during the hospital encounter of 02/04/20 (from the past 24 hour(s))  Comprehensive metabolic panel     Status: Abnormal   Collection Time: 02/04/20  6:20 PM  Result Value Ref Range   Sodium 141 135 - 145 mmol/L   Potassium 4.8 3.5 - 5.1 mmol/L   Chloride 109 98 - 111 mmol/L   CO2 24 22 - 32 mmol/L   Glucose, Bld 87 70 - 99 mg/dL   BUN 8 6 - 20 mg/dL   Creatinine, Ser 0.66  0.44 - 1.00 mg/dL   Calcium 9.0 8.9 - 10.3 mg/dL   Total Protein 6.6 6.5 - 8.1 g/dL   Albumin 2.5 (L) 3.5 - 5.0 g/dL   AST 18 15 - 41 U/L   ALT 18 0 - 44 U/L   Alkaline Phosphatase 97 38 - 126 U/L   Total Bilirubin 0.2 (L) 0.3 - 1.2 mg/dL   GFR calc non Af Amer >60 >60 mL/min   GFR calc Af Amer >60 >60 mL/min   Anion gap 8 5 - 15  CBC     Status: Abnormal   Collection Time: 02/04/20  6:20 PM  Result Value Ref Range   WBC 9.4 4.0 - 10.5 K/uL   RBC 4.55 3.87 - 5.11 MIL/uL   Hemoglobin 9.1 (L) 12.0 - 15.0 g/dL   HCT 29.2 (L) 36.0 - 46.0 %   MCV 64.2 (L) 80.0 - 100.0 fL   MCH 20.0 (L) 26.0 - 34.0 pg   MCHC 31.2 30.0 - 36.0 g/dL   RDW 15.3 11.5 - 15.5 %   Platelets 332 150 - 400 K/uL   nRBC 0.2 0.0 - 0.2 %  Urinalysis, Routine w reflex microscopic     Status: Abnormal   Collection Time: 02/04/20  6:46 PM  Result Value Ref Range   Color, Urine YELLOW YELLOW   APPearance HAZY (A) CLEAR   Specific Gravity, Urine 1.038 (H) 1.005 - 1.030   pH 5.0 5.0 - 8.0   Glucose, UA NEGATIVE NEGATIVE mg/dL   Hgb urine dipstick LARGE (A) NEGATIVE   Bilirubin Urine NEGATIVE NEGATIVE   Ketones, ur NEGATIVE NEGATIVE mg/dL   Protein, ur 30 (A) NEGATIVE mg/dL   Nitrite NEGATIVE NEGATIVE   Leukocytes,Ua NEGATIVE NEGATIVE   RBC / HPF 21-50 0 - 5 RBC/hpf   WBC, UA 6-10 0 - 5 WBC/hpf   Bacteria, UA NONE SEEN NONE SEEN   Squamous Epithelial / LPF 0-5 0 - 5   Mucus PRESENT     MAU Course  Procedures   Assessment and Plan  1. Bladder Spasm 2. CHTN  No evidence of UTI or other infection.  Pyridium for bladder spasm.  Recommended frequent and scheduled urination.  Start labatalol for hypertension.  Follow-up in office  Truett Mainland 02/04/2020, 7:25 PM

## 2020-02-04 NOTE — MAU Note (Signed)
Emergency c/s on 6/4 for pre- eclampsia.  Noted blood in her urine when she pees, having on the inside when she pees. Started having pain in her kidney yesterday, this morning it was really bad, esp with movement. Noted increased swelling in her feet.  Not feeling urge to pee since catheter removed.

## 2020-02-06 ENCOUNTER — Ambulatory Visit: Payer: BC Managed Care – PPO

## 2020-02-06 ENCOUNTER — Encounter (HOSPITAL_COMMUNITY): Payer: Self-pay | Admitting: Obstetrics & Gynecology

## 2020-02-10 ENCOUNTER — Ambulatory Visit: Payer: BC Managed Care – PPO

## 2020-03-11 ENCOUNTER — Ambulatory Visit: Payer: BC Managed Care – PPO | Admitting: Obstetrics & Gynecology

## 2020-03-16 ENCOUNTER — Telehealth (HOSPITAL_BASED_OUTPATIENT_CLINIC_OR_DEPARTMENT_OTHER): Payer: BC Managed Care – PPO | Admitting: Advanced Practice Midwife

## 2020-03-16 ENCOUNTER — Telehealth: Payer: Self-pay

## 2020-03-16 DIAGNOSIS — F53 Postpartum depression: Secondary | ICD-10-CM

## 2020-03-16 DIAGNOSIS — Z333 Pregnant state, gestational carrier: Secondary | ICD-10-CM

## 2020-03-16 DIAGNOSIS — N3941 Urge incontinence: Secondary | ICD-10-CM

## 2020-03-16 DIAGNOSIS — O99893 Other specified diseases and conditions complicating puerperium: Secondary | ICD-10-CM

## 2020-03-16 DIAGNOSIS — O99345 Other mental disorders complicating the puerperium: Secondary | ICD-10-CM

## 2020-03-16 DIAGNOSIS — T8149XA Infection following a procedure, other surgical site, initial encounter: Secondary | ICD-10-CM

## 2020-03-16 DIAGNOSIS — O8600 Infection of obstetric surgical wound, unspecified: Secondary | ICD-10-CM

## 2020-03-16 DIAGNOSIS — I10 Essential (primary) hypertension: Secondary | ICD-10-CM | POA: Insufficient documentation

## 2020-03-16 NOTE — Progress Notes (Signed)
Post Partum Visit Note Virtual/Video Visit  Provider location: [facility] [place]   I connected with [name] on 03/16/20 at  2:20 PM EDT by MyChart Video Encounter at home and verified that I am speaking with the correct person using two identifiers.   I discussed the limitations, risks, security and privacy concerns of performing an evaluation and management service virtually and the availability of in person appointments. I also discussed with the patient that there may be a patient responsible charge related to this service. The patient expressed understanding and agreed to proceed.  Julie King is a 29 y.o. [id number] female who presents for a postpartum visit. She is 6 weeks postpartum following a repeat cesarean section.  I have fully reviewed the prenatal and intrapartum course. The delivery was at 23 gestational weeks.  Anesthesia: spinal. Postpartum course has been doing well. Baby is doing well. Baby is feeding by breast-pumping. Bleeding thick, heavy lochia. Bowel function is normal. Bladder function is abnormal: pt can't feel when she needs to urinate. . Patient is sexually active. Contraception method is none. Please discuss BC options.    Postpartum depression screening: positive, score 10. Pt is currently being seen by [facility].  The following portions of the patient's history were reviewed and updated as appropriate: allergies, current medications, past family history, past medical history, past social history, past surgical history and problem list.  Review of Systems Pertinent items noted in HPI and remainder of comprehensive ROS otherwise negative.    Objective:  currently breastfeeding.   General:  Alert, oriented and cooperative. Patient appears to be in no acute distress.  Mental Status: Normal mood and affect. Normal behavior. Normal judgment and thought content.   Respiratory: Normal respiratory effort, no problems with  respiration noted  Rest of physical exam deferred due to type of encounter        Assessment:   1. Urge incontinence --Pt without sense that she needs to urinate until it is too late then she has to go suddenly and is having some incontinence.  - Ambulatory referral to Physical Therapy  2. Delayed postpartum hemorrhage --Pt with bleeding x 4 weeks, 3 days without bleeding then onset of heavy bleeding with clots, soaking through pads and onto her clothes  - US PELVIC COMPLETE WITH TRANSVAGINAL; Future  3. Pregnancy in person acting as gestational surrogate --Pregnancy was planned surrogate pregnancy and infant is doing well and formula fed with adoptive parents. --Pt is pumping breastmilk and donating to another family.    4. Infected incision --Pt with occasional chills, no fever and reports odor at incision site that appears to be healing well otherwise. --Difficult to assess in virtual visit, pt scheduled for incision check in office   Plan:   Essential components of care per ACOG recommendations:  1.  Mood and well being: Patient with positive depression screening today. Reviewed local resources for support. Pt is receiving counseling at [facility]. - Patient does not use tobacco.  - hx of drug use? No    2. Infant care and feeding:  -Patient currently breastmilk feeding? Yes  Pt infant is formula fed with adoptive parents.  Pt is donating pumped breastmilk to another family in need. -Social determinants of health (SDOH) reviewed in EPIC. No concerns  3. Sexuality, contraception and birth spacing - Patient does not want a pregnancy in the next year.  - Pt in same sex relationship and does not desire contraception.  4. Sleep  and fatigue -Encouraged family/partner/community support of 4 hrs of uninterrupted sleep to help with mood and fatigue  5. Physical Recovery  - Discussed patients delivery and complications - Patient had a repeat C/S after IOL started and fetal lie  unstable/breech position noted. - Patient has urinary incontinence? Yes  Patient was referred to pelvic floor PT  - Patient is safe to resume physical and sexual activity     I discussed the assessment and treatment plan with the patient. The patient was provided an opportunity to ask questions and all were answered. The patient agreed with the plan and demonstrated an understanding of the instructions.   The patient was advised to call back or seek an in-person evaluation/go to the ED if the symptoms worsen or if the condition fails to improve as anticipated.  I provided 10 minutes of face-to-face time during this encounter.  Fatima Blank, [place] for [employer], [place]

## 2020-03-16 NOTE — Telephone Encounter (Signed)
Patient called and left message on triage vm stating that she delivered on 6/4 and that she has now started back bleeding and is filling 2 pads an hour. Returned patient call this am x2. The first attempt a young child answered the phone and was not able to give phone to patient when asked, and then the phone hung up. The second attempt the phone was picked up and hung up.   Will attempt to call patient back again.

## 2020-03-20 ENCOUNTER — Other Ambulatory Visit: Payer: Self-pay

## 2020-03-20 ENCOUNTER — Ambulatory Visit (HOSPITAL_COMMUNITY)
Admission: EM | Admit: 2020-03-20 | Discharge: 2020-03-20 | Disposition: A | Discharge status: Home / Self Care | Payer: BC Managed Care – PPO | Attending: Family Medicine | Admitting: Family Medicine

## 2020-03-20 DIAGNOSIS — R05 Cough: Secondary | ICD-10-CM | POA: Diagnosis present

## 2020-03-20 DIAGNOSIS — J069 Acute upper respiratory infection, unspecified: Secondary | ICD-10-CM | POA: Diagnosis not present

## 2020-03-20 DIAGNOSIS — I4891 Unspecified atrial fibrillation: Secondary | ICD-10-CM | POA: Insufficient documentation

## 2020-03-20 DIAGNOSIS — I10 Essential (primary) hypertension: Secondary | ICD-10-CM | POA: Insufficient documentation

## 2020-03-20 DIAGNOSIS — Z20822 Contact with and (suspected) exposure to covid-19: Secondary | ICD-10-CM | POA: Insufficient documentation

## 2020-03-20 DIAGNOSIS — Z87891 Personal history of nicotine dependence: Secondary | ICD-10-CM | POA: Insufficient documentation

## 2020-03-20 MED ORDER — CETIRIZINE HCL 10 MG PO CAPS
10.0000 mg | ORAL_CAPSULE | Freq: Every day | ORAL | 0 refills | Status: DC
Start: 2020-03-20 — End: 2020-04-22

## 2020-03-20 MED ORDER — FLUTICASONE PROPIONATE 50 MCG/ACT NA SUSP: 1.0000 | Freq: Every day | NASAL | 0 refills | Status: DC

## 2020-03-20 MED ORDER — BENZONATATE 200 MG PO CAPS
200.0000 mg | ORAL_CAPSULE | Freq: Three times a day (TID) | ORAL | 0 refills | Status: AC | PRN
Start: 2020-03-20 — End: 2020-03-27

## 2020-03-20 NOTE — Discharge Instructions (Signed)
Covid test pending, monitor my chart for results Begin Flonase nasal spray 1 to 2 spray in each nostril daily Daily cetirizine/Zyrtec or loratadine/Claritin-may get over-the-counter Tessalon/benzonatate every 8 hours for cough Rest and fluids Tylenol and ibuprofen as needed for body aches, headaches and fever  Please follow-up if any symptoms not improving or worsening despite use of the above and resting over the next week

## 2020-03-20 NOTE — ED Triage Notes (Signed)
Pt c/o headache x 2 weeks and nasal congestion and cough x 2 days. Had COVID in December, states she feels the same

## 2020-03-21 LAB — SARS CORONAVIRUS 2 (TAT 6-24 HRS): SARS Coronavirus 2: NEGATIVE

## 2020-03-23 NOTE — ED Provider Notes (Signed)
[facility]    CSN: [id number] Arrival date & time: 03/20/20  1810      History   Chief Complaint Chief Complaint  Patient presents with  . Headache  . Cough    HPI Julie King is a 30 y.o. female presenting today for evaluation URI symptoms.  Patient reports that over the past 2 days she has developed cough congestion and body aches.  She has had subjective fevers.  She reports she has had a headache which has been going on for approximately 2 weeks.  Had Covid 07/2019 and reports feeling very similar today.  She is not vaccinated.  She is approximately 1 month postpartum from surrogacy and is currently pumping and donating breastmilk to baby.  HPI  Past Medical History:  Diagnosis Date  . Atrial fibrillation ([place])   . Headache 07/25/2017  . Hypertension   . Irritable bowel syndrome   . Morbidly obese ([place]) 07/25/2017  . QT prolongation   . T wave inversion in EKG     Patient Active Problem List   Diagnosis Date Noted  . Essential hypertension 03/16/2020  . History of atrial fibrillation 11/28/2019  . Headache 07/25/2017  . Morbidly obese ([place]) 07/25/2017    Past Surgical History:  Procedure Laterality Date  . CESAREAN SECTION    . CESAREAN SECTION N/A 01/31/2020   Procedure: CESAREAN SECTION;  Surgeon: Chancy Milroy, MD;  Location: [facility];  Service: Obstetrics;  Laterality: N/A;  . CHOLECYSTECTOMY    . HERNIA REPAIR    . TONSILLECTOMY      OB History    Gravida  2   Para  2   Term  1   Preterm  1   AB      Living  2     SAB      TAB      Ectopic      Multiple  0   Live Births  2            Home Medications    Prior to Admission medications   Medication Sig Start Date End Date Taking? Authorizing Provider  albuterol (PROVENTIL HFA;VENTOLIN HFA) 108 (90 Base) MCG/ACT inhaler Inhale 2 puffs into the lungs every 6 (six) hours as needed for wheezing or shortness of breath. 12/05/18   Martyn Ehrich, NP    benzonatate (TESSALON) 200 MG capsule Take 1 capsule (200 mg total) by mouth 3 (three) times daily as needed for up to 7 days for cough. 03/20/20 03/27/20  Jarelle Ates C, PA-C  Cetirizine HCl 10 MG CAPS Take 1 capsule (10 mg total) by mouth daily for 10 days. 03/20/20 03/30/20  Majour Frei C, PA-C  fluticasone (FLONASE) 50 MCG/ACT nasal spray Place 1-2 sprays into both nostrils daily for 7 days. 03/20/20 03/27/20  Jairen Goldfarb C, PA-C  ibuprofen (ADVIL) 600 MG tablet Take 1 tablet (600 mg total) by mouth every 6 (six) hours as needed. 02/03/20   Sloan Leiter, MD    Family History Family History  Problem Relation Age of Onset  . Hypertension Other   . Diabetes Other   . Crohn's disease Mother   . Hypertension Mother   . Thyroid disease Father   . Diabetes Father   . Breast cancer Maternal Aunt        late 53 early 36s  . Breast cancer Maternal Grandmother        late 35s early 48    Social History  Social History   Tobacco Use  . Smoking status: Former Smoker    Packs/day: 0.50    Years: 10.00    Pack years: 5.00    Types: Cigarettes  . Smokeless tobacco: Never Used  . Tobacco comment: wants to quit but states very difficult Jan 2020  Vaping Use  . Vaping Use: Never used  Substance Use Topics  . Alcohol use: Not Currently  . Drug use: No     Allergies   Percocet [oxycodone-acetaminophen] and Pineapple   Review of Systems Review of Systems  Constitutional: Positive for fatigue and fever. Negative for activity change, appetite change and chills.  HENT: Positive for congestion, rhinorrhea and sore throat. Negative for ear pain, sinus pressure and trouble swallowing.   Eyes: Negative for discharge and redness.  Respiratory: Positive for cough and shortness of breath. Negative for chest tightness.   Cardiovascular: Negative for chest pain.  Gastrointestinal: Negative for abdominal pain, diarrhea, nausea and vomiting.  Musculoskeletal: Positive for myalgias.  Skin:  Negative for rash.  Neurological: Positive for headaches. Negative for dizziness and light-headedness.     Physical Exam Triage Vital Signs ED Triage Vitals  Enc Vitals Group     BP 03/20/20 1854 (!) 155/86     Pulse Rate 03/20/20 1854 95     Resp 03/20/20 1854 16     Temp 03/20/20 1854 99.5 F (37.5 C)     Temp src --      SpO2 03/20/20 1854 100 %     Weight --      Height --      Head Circumference --      Peak Flow --      Pain Score 03/20/20 1909 6     Pain Loc --      Pain Edu? --      Excl. in [place]? --    No data found.  Updated Vital Signs BP (!) 155/86   Pulse 95   Temp 99.5 F (37.5 C)   Resp 16   LMP  (LMP Unknown)   SpO2 100%   Visual Acuity Right Eye Distance:   Left Eye Distance:   Bilateral Distance:    Right Eye Near:   Left Eye Near:    Bilateral Near:     Physical Exam Vitals and nursing note reviewed.  Constitutional:      Appearance: She is well-developed.     Comments: No acute distress  HENT:     Head: Normocephalic and atraumatic.     Ears:     Comments: Bilateral ears without tenderness to palpation of external auricle, tragus and mastoid, EAC's without erythema or swelling, TM's with good bony landmarks and cone of light. Non erythematous.     Nose: Nose normal.     Mouth/Throat:     Comments: Oral mucosa pink and moist, no tonsillar enlargement or exudate. Posterior pharynx patent and nonerythematous, no uvula deviation or swelling. Normal phonation.  Eyes:     Conjunctiva/sclera: Conjunctivae normal.  Cardiovascular:     Rate and Rhythm: Normal rate.  Pulmonary:     Effort: Pulmonary effort is normal. No respiratory distress.     Comments: Breathing comfortably at rest, CTABL, no wheezing, rales or other adventitious sounds auscultated  Abdominal:     General: There is no distension.  Musculoskeletal:        General: Normal range of motion.     Cervical back: Neck supple.  Skin:    General: Skin is  warm and dry.    Neurological:     Mental Status: She is alert and oriented to person, place, and time.      UC Treatments / Results  Labs (all labs ordered are listed, but only abnormal results are displayed) Labs Reviewed  SARS CORONAVIRUS 2 (TAT 6-24 HRS)    EKG   Radiology No results found.  Procedures Procedures (including critical care time)  Medications Ordered in UC Medications - No data to display  Initial Impression / Assessment and Plan / UC Course  I have reviewed the triage vital signs and the nursing notes.  Pertinent labs & imaging results that were available during my care of the patient were reviewed by me and considered in my medical decision making (see chart for details).    2 days of URI symptoms, lungs clear, exam unremarkable, vital signs stable, suspect likely viral etiology.  Covid PCR pending.  Recommending symptomatic and supportive care with close monitoring, rest and fluids.  Discussed strict return precautions. Patient verbalized understanding and is agreeable with plan.  Final Clinical Impressions(s) / UC Diagnoses   Final diagnoses:  Viral URI with cough     Discharge Instructions     Covid test pending, monitor my chart for results Begin Flonase nasal spray 1 to 2 spray in each nostril daily Daily cetirizine/Zyrtec or loratadine/Claritin-may get over-the-counter Tessalon/benzonatate every 8 hours for cough Rest and fluids Tylenol and ibuprofen as needed for body aches, headaches and fever  Please follow-up if any symptoms not improving or worsening despite use of the above and resting over the next week   ED Prescriptions    Medication Sig Dispense Auth. Provider   fluticasone (FLONASE) 50 MCG/ACT nasal spray Place 1-2 sprays into both nostrils daily for 7 days. 1 g Iceis Knab C, PA-C   Cetirizine HCl 10 MG CAPS Take 1 capsule (10 mg total) by mouth daily for 10 days. 10 capsule Kessler Kopinski C, PA-C   benzonatate (TESSALON) 200 MG  capsule Take 1 capsule (200 mg total) by mouth 3 (three) times daily as needed for up to 7 days for cough. 28 capsule Annette Liotta, [place] C, PA-C     PDMP not reviewed this encounter.   Joneen Caraway Antlers C, PA-C 03/23/20 1025

## 2020-04-16 IMAGING — CT CT ABD-PELV W/ CM
1 of 2 series · 15 of 32 positions shown, 19 images · IV contrast (APPLIED)
Comparison: Chest CT 04/12/2017. Abdominal and pelvic CT 03/26/2017

CLINICAL DATA: Workup for possible lymphoma. Systemic
lymphadenopathy

EXAM:
CT CHEST, ABDOMEN, AND PELVIS WITH CONTRAST
TECHNIQUE: Multidetector CT imaging of the chest, abdomen and pelvis was
performed following the standard protocol during bolus
administration of intravenous contrast.
CONTRAST:  125mL JWCIGD-SDD IOPAMIDOL (JWCIGD-SDD) INJECTION 61%

[Series 6: lung · axial · 0.73mm/px · z∈[-344,-70]mm · 15 of 157 slices shown, 19 images]
[im 10/157  soft-tissue]
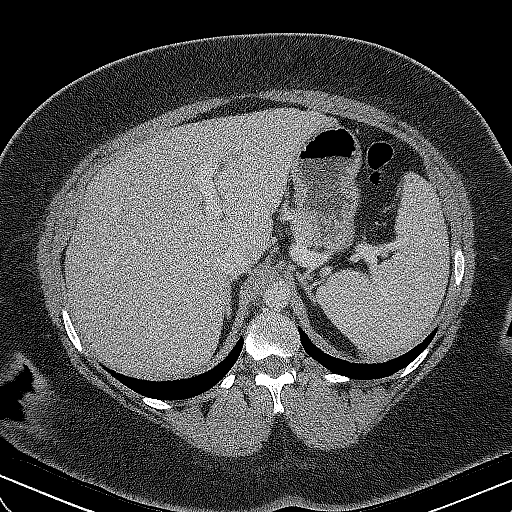
[im 10/157  lung]
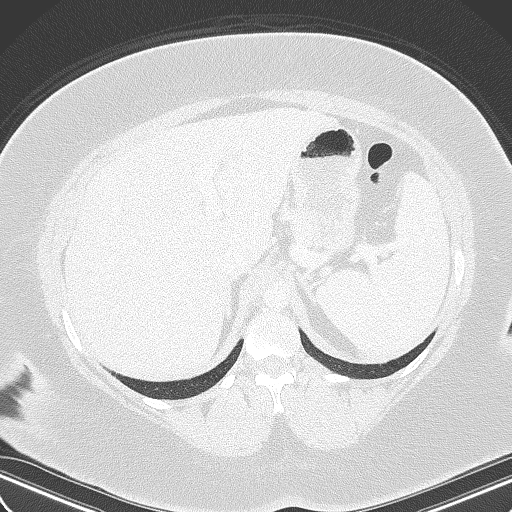
[im 19/157  lung]
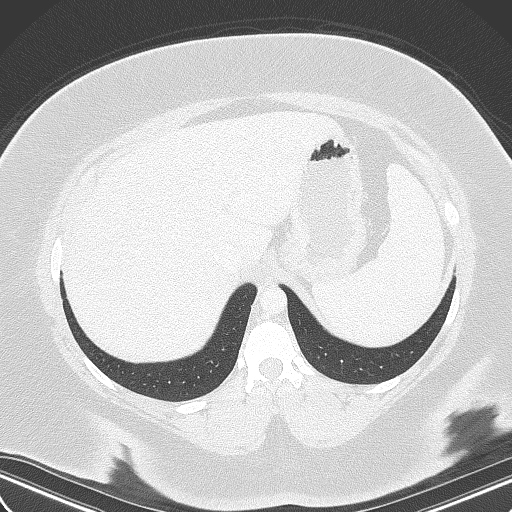
[im 28/157  lung]
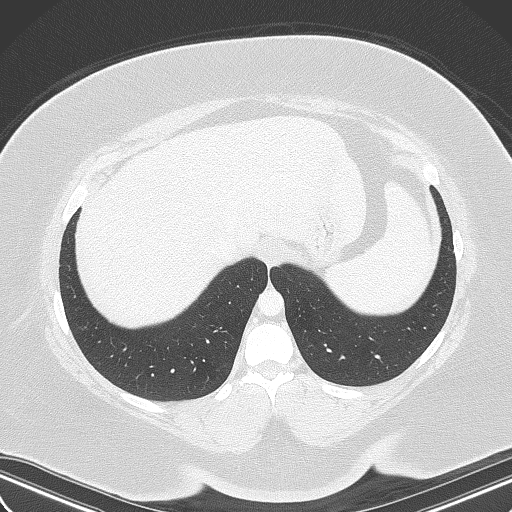
[im 37/157  lung]
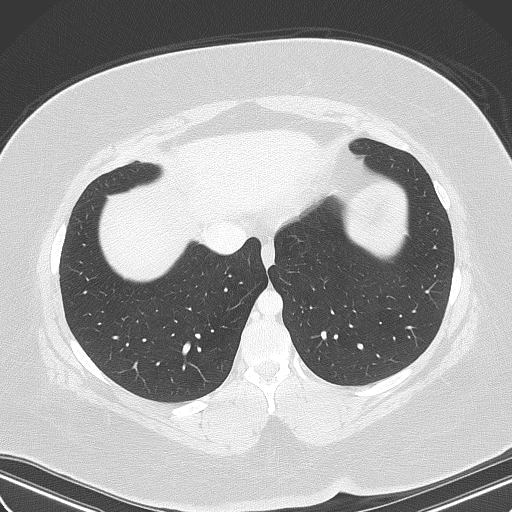
[im 46/157  soft-tissue]
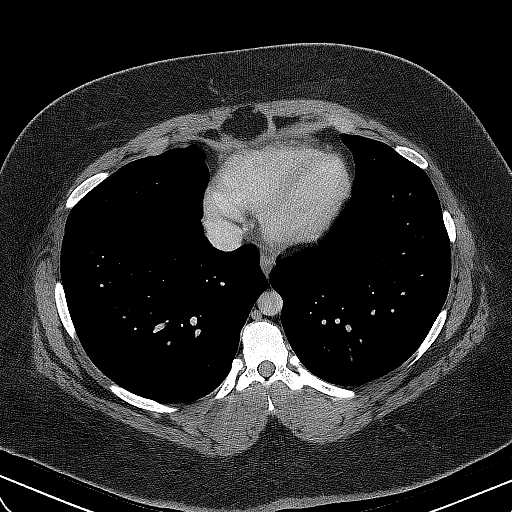
[im 46/157  lung]
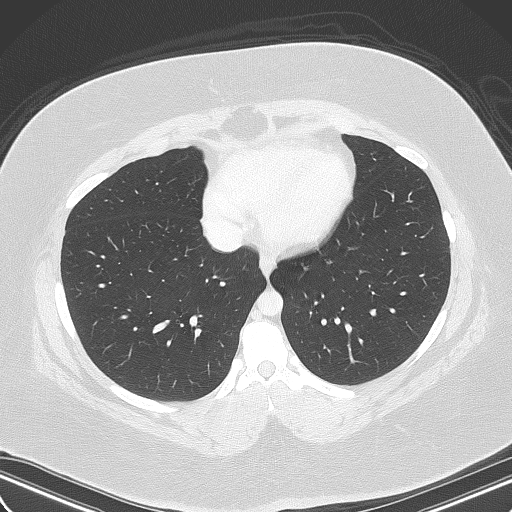
[im 56/157  lung]
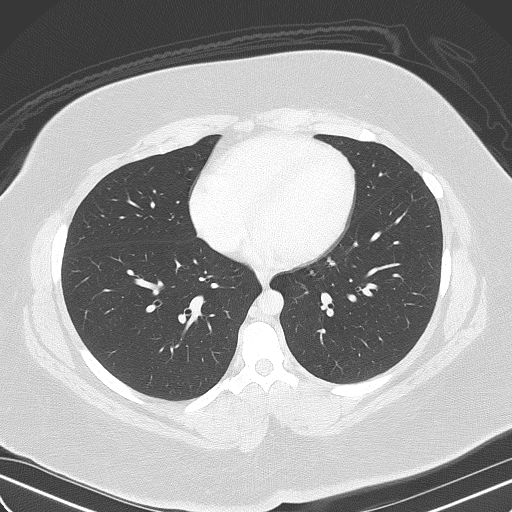
[im 65/157  lung]
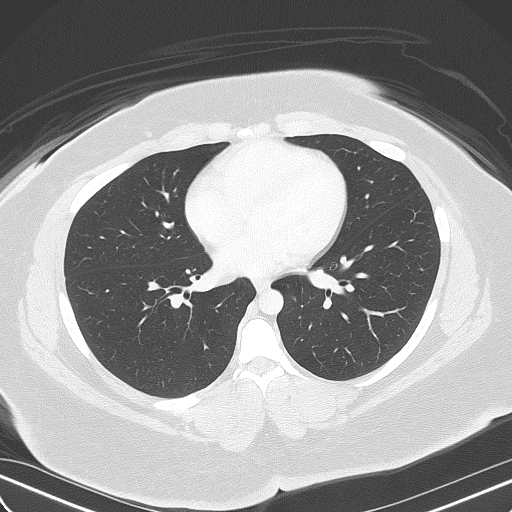
[im 83/157  lung]
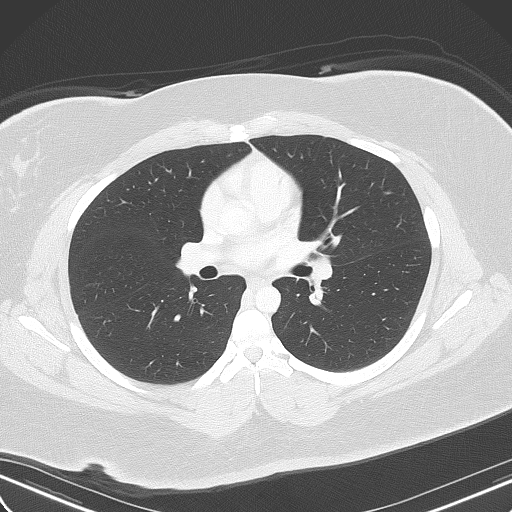
[im 92/157  soft-tissue]
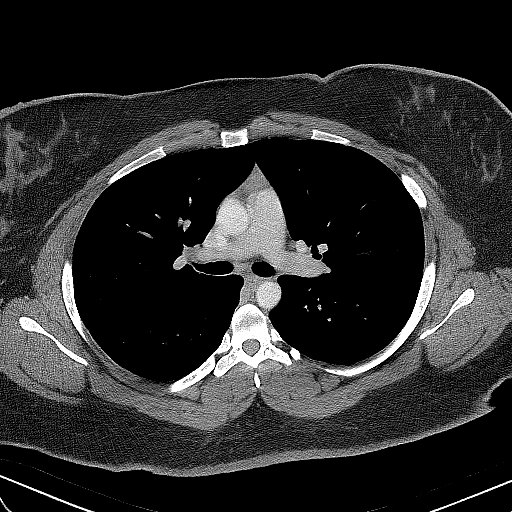
[im 92/157  lung]
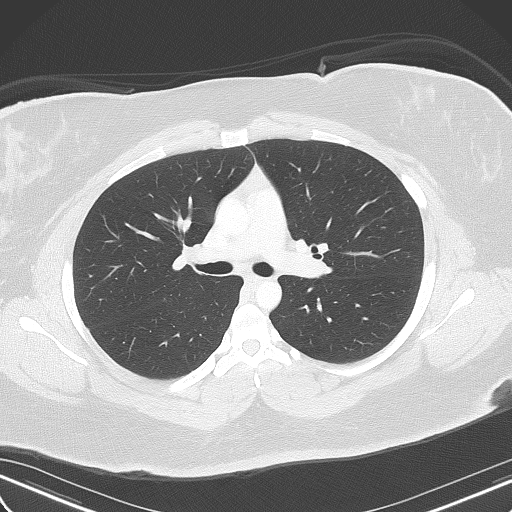
[im 101/157  lung]
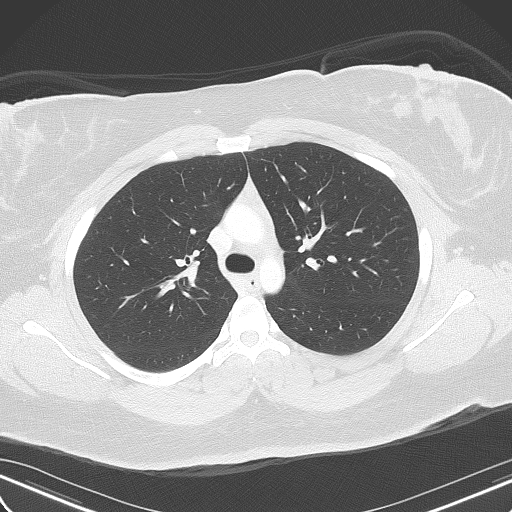
[im 111/157  lung]
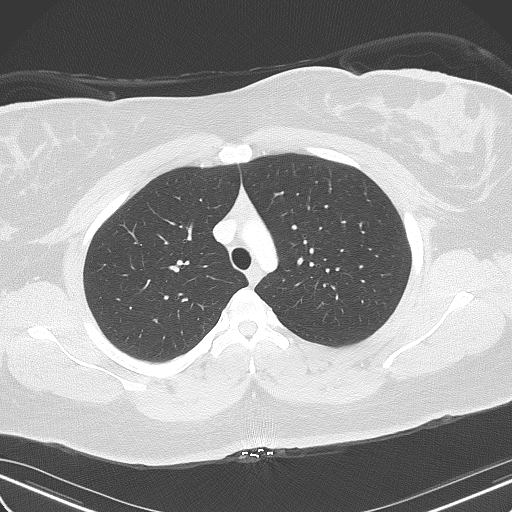
[im 120/157  lung]
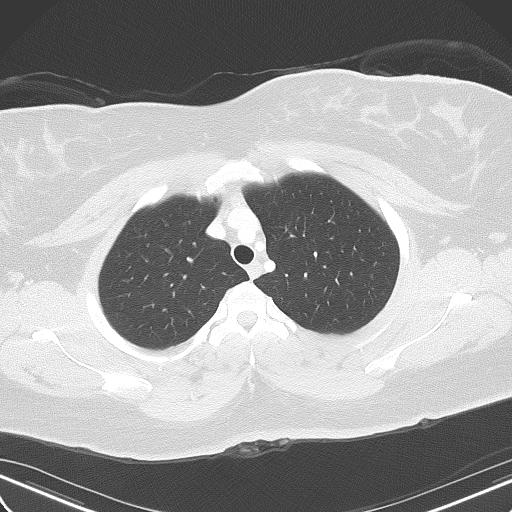
[im 129/157  soft-tissue]
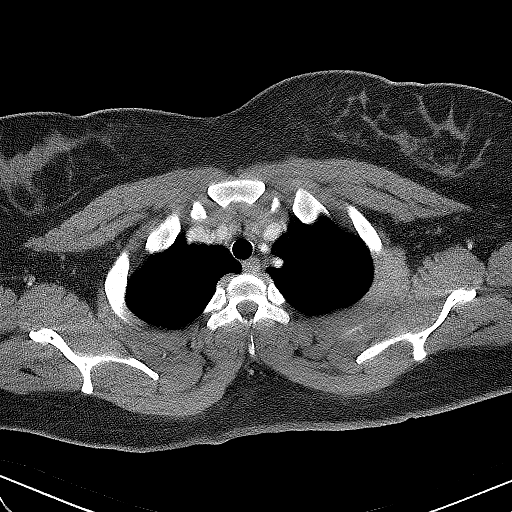
[im 129/157  lung]
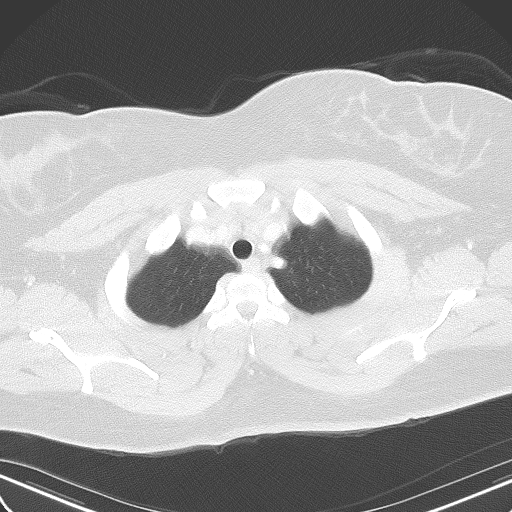
[im 138/157  lung]
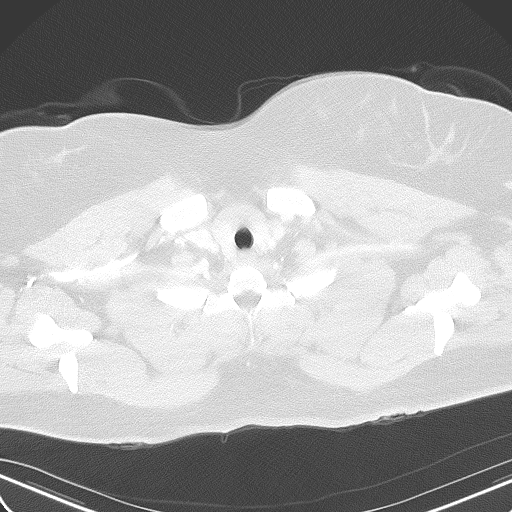
[im 147/157  lung]
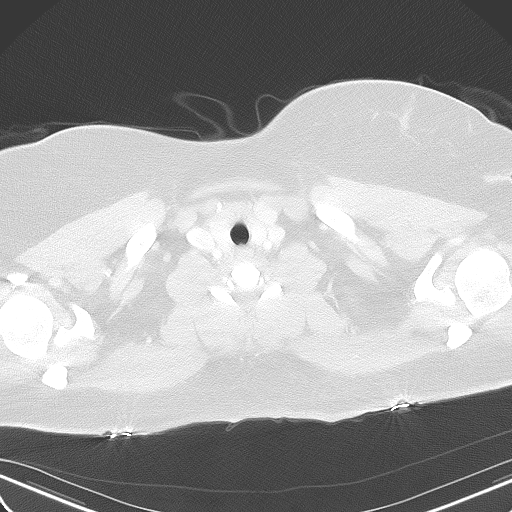

[15 of 32 positions shown; findings below may reference images not displayed]

FINDINGS: CT CHEST FINDINGS

Cardiovascular: Heart is normal size. Aorta is normal caliber.

Mediastinum/Nodes: No mediastinal, hilar, or axillary adenopathy.
Triangular wispy soft tissue in the anterior mediastinum felt
represent residual thymus.

Lungs/Pleura: Lungs are clear. No focal airspace opacities or
suspicious nodules. No effusions.

Musculoskeletal: No acute bony abnormality or focal bone lesion.

CT ABDOMEN PELVIS FINDINGS

Hepatobiliary: No focal liver abnormality is seen. Status post
cholecystectomy. No biliary dilatation.

Pancreas: No focal abnormality or ductal dilatation.

Spleen: No focal abnormality. Normal size. Craniocaudal length 10
cm.

Adrenals/Urinary Tract: No adrenal abnormality. No focal renal
abnormality. No stones or hydronephrosis. Urinary bladder is
unremarkable.

Stomach/Bowel: Stomach, large and small bowel grossly unremarkable.
Appendix normal.

Vascular/Lymphatic: Aorta normal caliber. No abdominal or pelvic
adenopathy. Small shotty inguinal lymph nodes, none pathologically
enlarged, with largest lymph node bilaterally adding a short axis
diameter of approximately 8 mm.

Reproductive: Uterus and adnexa unremarkable.  No mass.

Other: No free fluid or free air.

Musculoskeletal: No acute bony abnormality.
IMPRESSION: No adenopathy in the chest, abdomen or pelvis.

No active cardiopulmonary disease. No acute findings in the abdomen
or pelvis.

## 2020-04-22 ENCOUNTER — Encounter (HOSPITAL_COMMUNITY): Payer: Self-pay

## 2020-04-22 ENCOUNTER — Other Ambulatory Visit: Payer: Self-pay

## 2020-04-22 ENCOUNTER — Ambulatory Visit (INDEPENDENT_AMBULATORY_CARE_PROVIDER_SITE_OTHER): Payer: Medicaid Other

## 2020-04-22 ENCOUNTER — Ambulatory Visit (HOSPITAL_COMMUNITY)
Admission: EM | Admit: 2020-04-22 | Discharge: 2020-04-22 | Disposition: A | Discharge status: Home / Self Care | Payer: Medicaid Other | Attending: Emergency Medicine | Admitting: Emergency Medicine

## 2020-04-22 DIAGNOSIS — F17200 Nicotine dependence, unspecified, uncomplicated: Secondary | ICD-10-CM | POA: Diagnosis not present

## 2020-04-22 DIAGNOSIS — Z885 Allergy status to narcotic agent status: Secondary | ICD-10-CM | POA: Diagnosis not present

## 2020-04-22 DIAGNOSIS — R0602 Shortness of breath: Secondary | ICD-10-CM | POA: Diagnosis not present

## 2020-04-22 DIAGNOSIS — I4891 Unspecified atrial fibrillation: Secondary | ICD-10-CM | POA: Diagnosis not present

## 2020-04-22 DIAGNOSIS — Z20822 Contact with and (suspected) exposure to covid-19: Secondary | ICD-10-CM | POA: Insufficient documentation

## 2020-04-22 DIAGNOSIS — I10 Essential (primary) hypertension: Secondary | ICD-10-CM | POA: Diagnosis not present

## 2020-04-22 DIAGNOSIS — K589 Irritable bowel syndrome without diarrhea: Secondary | ICD-10-CM | POA: Diagnosis not present

## 2020-04-22 DIAGNOSIS — Z9049 Acquired absence of other specified parts of digestive tract: Secondary | ICD-10-CM | POA: Insufficient documentation

## 2020-04-22 DIAGNOSIS — Z8249 Family history of ischemic heart disease and other diseases of the circulatory system: Secondary | ICD-10-CM | POA: Diagnosis not present

## 2020-04-22 DIAGNOSIS — R059 Cough, unspecified: Secondary | ICD-10-CM

## 2020-04-22 DIAGNOSIS — R05 Cough: Secondary | ICD-10-CM | POA: Diagnosis not present

## 2020-04-22 DIAGNOSIS — Z7952 Long term (current) use of systemic steroids: Secondary | ICD-10-CM | POA: Insufficient documentation

## 2020-04-22 DIAGNOSIS — Z79899 Other long term (current) drug therapy: Secondary | ICD-10-CM | POA: Insufficient documentation

## 2020-04-22 DIAGNOSIS — Z791 Long term (current) use of non-steroidal anti-inflammatories (NSAID): Secondary | ICD-10-CM | POA: Insufficient documentation

## 2020-04-22 LAB — SARS CORONAVIRUS 2 (TAT 6-24 HRS): SARS Coronavirus 2: NEGATIVE

## 2020-04-22 MED ORDER — CETIRIZINE HCL 10 MG PO TABS: 10.0000 mg | ORAL_TABLET | Freq: Every day | ORAL | 0 refills | Status: DC

## 2020-04-22 MED ORDER — PREDNISONE 10 MG (21) PO TBPK
ORAL_TABLET | Freq: Every day | ORAL | 0 refills | Status: DC
Start: 2020-04-22 — End: 2020-11-11

## 2020-04-22 MED ORDER — ALBUTEROL SULFATE HFA 108 (90 BASE) MCG/ACT IN AERS: 2.0000 | INHALATION_SPRAY | Freq: Four times a day (QID) | RESPIRATORY_TRACT | 0 refills | Status: DC | PRN

## 2020-04-22 NOTE — ED Triage Notes (Signed)
Pt c/o cough and shortness of breath x 3 weeks. Concerned about mold in her home.

## 2020-04-22 NOTE — Discharge Instructions (Addendum)
Your chest xray looks well today.  I have sent some prednisone for your breathing/ chest discomfort and coughing.  With the mold exposure I would recommend daily zyrtec.  Use of inhaler as needed for wheezing or shortness of breath.   If symptoms worsen or do not improve in the next week to return to be seen or to follow up with your PCP.

## 2020-04-22 NOTE — ED Provider Notes (Signed)
[facility]    CSN: [id number] Arrival date & time: 04/22/20  1452      History   Chief Complaint Chief Complaint  Patient presents with  . Cough  . Shortness of Breath    HPI Julie King is a 29 y.o. female.   Julie King presents with complaints of cough, shortness of breath . Symptoms for a few weeks now. Has two types of mold in her home which she is concerned about. Today feels shortness of breath and chest heaviness. Worse at night with lying. Today has been worse including cough. No fevers. No nasal drainage. No sore throat. No ear pain. Headaches, some when in the home. No body aches. Hasn't taken any medications for her symptoms. No asthma history. No ill contacts. Smokes occasionally, has decreased recently. No inhalers at home.    ROS per HPI, negative if not otherwise mentioned.      Past Medical History:  Diagnosis Date  . Atrial fibrillation ([place])   . Headache 07/25/2017  . Hypertension   . Irritable bowel syndrome   . Morbidly obese ([place]) 07/25/2017  . QT prolongation   . T wave inversion in EKG     Patient Active Problem List   Diagnosis Date Noted  . Essential hypertension 03/16/2020  . History of atrial fibrillation 11/28/2019  . Headache 07/25/2017  . Morbidly obese ([place]) 07/25/2017    Past Surgical History:  Procedure Laterality Date  . CESAREAN SECTION    . CESAREAN SECTION N/A 01/31/2020   Procedure: CESAREAN SECTION;  Surgeon: Chancy Milroy, MD;  Location: [facility];  Service: Obstetrics;  Laterality: N/A;  . CHOLECYSTECTOMY    . HERNIA REPAIR    . TONSILLECTOMY      OB History    Gravida  2   Para  2   Term  1   Preterm  1   AB      Living  2     SAB      TAB      Ectopic      Multiple  0   Live Births  2            Home Medications    Prior to Admission medications   Medication Sig Start Date End Date Taking? Authorizing Provider  albuterol (VENTOLIN HFA) 108 (90 Base) MCG/ACT  inhaler Inhale 2 puffs into the lungs every 6 (six) hours as needed for wheezing or shortness of breath. 04/22/20   Zigmund Gottron, NP  cetirizine (ZYRTEC) 10 MG tablet Take 1 tablet (10 mg total) by mouth daily. 04/22/20   Zigmund Gottron, NP  ibuprofen (ADVIL) 600 MG tablet Take 1 tablet (600 mg total) by mouth every 6 (six) hours as needed. 02/03/20   Sloan Leiter, MD  predniSONE (STERAPRED UNI-PAK 21 TAB) 10 MG (21) TBPK tablet Take by mouth daily. Per box instruction 04/22/20   Zigmund Gottron, NP    Family History Family History  Problem Relation Age of Onset  . Hypertension Other   . Diabetes Other   . Crohn's disease Mother   . Hypertension Mother   . Thyroid disease Father   . Diabetes Father   . Breast cancer Maternal Aunt        late 56 early 61s  . Breast cancer Maternal Grandmother        late 2s early 28    Social History Social History   Tobacco Use  .  Smoking status: Former Smoker    Packs/day: 0.50    Years: 10.00    Pack years: 5.00    Types: Cigarettes  . Smokeless tobacco: Never Used  . Tobacco comment: wants to quit but states very difficult Jan 2020  Vaping Use  . Vaping Use: Never used  Substance Use Topics  . Alcohol use: Not Currently  . Drug use: No     Allergies   Percocet [oxycodone-acetaminophen] and Pineapple   Review of Systems Review of Systems   Physical Exam Triage Vital Signs ED Triage Vitals  Enc Vitals Group     BP 04/22/20 1626 124/75     Pulse Rate 04/22/20 1626 89     Resp 04/22/20 1626 16     Temp 04/22/20 1626 98.9 F (37.2 C)     Temp src --      SpO2 04/22/20 1626 100 %     Weight --      Height --      Head Circumference --      Peak Flow --      Pain Score 04/22/20 1624 0     Pain Loc --      Pain Edu? --      Excl. in [place]? --    No data found.  Updated Vital Signs BP 124/75   Pulse 89   Temp 98.9 F (37.2 C)   Resp 16   LMP 04/12/2020   SpO2 100%   Visual Acuity Right Eye Distance:   Left  Eye Distance:   Bilateral Distance:    Right Eye Near:   Left Eye Near:    Bilateral Near:     Physical Exam Constitutional:      General: She is not in acute distress.    Appearance: She is well-developed.  Cardiovascular:     Rate and Rhythm: Normal rate.  Pulmonary:     Effort: Pulmonary effort is normal.     Breath sounds: Normal breath sounds.  Skin:    General: Skin is warm and dry.  Neurological:     Mental Status: She is alert and oriented to person, place, and time.      UC Treatments / Results  Labs (all labs ordered are listed, but only abnormal results are displayed) Labs Reviewed  SARS CORONAVIRUS 2 (TAT 6-24 HRS)    EKG   Radiology DG Chest 2 View  Result Date: 04/22/2020 CLINICAL DATA:  Persistent cough and shortness of breath for 2 weeks EXAM: CHEST - 2 VIEW COMPARISON:  05/14/2018 FINDINGS: The heart size and mediastinal contours are within normal limits. Both lungs are clear. The visualized skeletal structures are unremarkable. IMPRESSION: No active cardiopulmonary disease. Electronically Signed   By: Inez Catalina M.D.   On: 04/22/2020 17:01    Procedures Procedures (including critical care time)  Medications Ordered in UC Medications - No data to display  Initial Impression / Assessment and Plan / UC Course  I have reviewed the triage vital signs and the nursing notes.  Pertinent labs & imaging results that were available during my care of the patient were reviewed by me and considered in my medical decision making (see chart for details).     Non toxic. Benign physical exam.  Vitals stable. Chest xray without cute findings. Prednisone and allergy treatments recommended. Encouraged to discontinue smoking. Patient verbalized understanding and agreeable to plan.   Final Clinical Impressions(s) / UC Diagnoses   Final diagnoses:  Cough  Shortness of breath  Discharge Instructions     Your chest xray looks well today.  I have sent some  prednisone for your breathing/ chest discomfort and coughing.  With the mold exposure I would recommend daily zyrtec.  Use of inhaler as needed for wheezing or shortness of breath.   If symptoms worsen or do not improve in the next week to return to be seen or to follow up with your PCP.      ED Prescriptions    Medication Sig Dispense Auth. Provider   predniSONE (STERAPRED UNI-PAK 21 TAB) 10 MG (21) TBPK tablet Take by mouth daily. Per box instruction 21 tablet Uriel Horkey B, NP   albuterol (VENTOLIN HFA) 108 (90 Base) MCG/ACT inhaler Inhale 2 puffs into the lungs every 6 (six) hours as needed for wheezing or shortness of breath. 8 g Augusto Gamble B, NP   cetirizine (ZYRTEC) 10 MG tablet Take 1 tablet (10 mg total) by mouth daily. 30 tablet Zigmund Gottron, NP     PDMP not reviewed this encounter.   Zigmund Gottron, NP 04/23/20 1234

## 2020-06-02 ENCOUNTER — Encounter: Payer: Medicaid Other | Admitting: Physical Therapy

## 2020-06-05 ENCOUNTER — Other Ambulatory Visit (HOSPITAL_COMMUNITY): Payer: Self-pay | Admitting: Radiology

## 2020-06-05 DIAGNOSIS — J4540 Moderate persistent asthma, uncomplicated: Secondary | ICD-10-CM

## 2020-06-09 ENCOUNTER — Encounter: Payer: Self-pay | Admitting: Obstetrics

## 2020-06-09 ENCOUNTER — Other Ambulatory Visit: Payer: Self-pay | Admitting: Obstetrics

## 2020-06-09 ENCOUNTER — Ambulatory Visit (INDEPENDENT_AMBULATORY_CARE_PROVIDER_SITE_OTHER): Payer: Medicaid Other | Admitting: Obstetrics

## 2020-06-09 ENCOUNTER — Other Ambulatory Visit: Payer: Self-pay

## 2020-06-09 ENCOUNTER — Encounter: Payer: BC Managed Care – PPO | Admitting: Physical Therapy

## 2020-06-09 VITALS — BP 143/90 | HR 102 | Ht 67.0 in | Wt 290.6 lb

## 2020-06-09 DIAGNOSIS — N6315 Unspecified lump in the right breast, overlapping quadrants: Secondary | ICD-10-CM

## 2020-06-09 DIAGNOSIS — N6325 Unspecified lump in the left breast, overlapping quadrants: Secondary | ICD-10-CM

## 2020-06-09 DIAGNOSIS — Z6841 Body Mass Index (BMI) 40.0 and over, adult: Secondary | ICD-10-CM | POA: Diagnosis not present

## 2020-06-09 NOTE — Progress Notes (Signed)
Pt is in the office reporting lump in R breast. Pt states that she noticed lump in July/August after her delivery in June. She states that she never breast fed her baby, and lump has grown in size since she noticed it and can be tender at times. Pt states that the lump feels like it is the size of a pee and feels like a rock.. BP elevated today, pt never came for a pp visit.

## 2020-06-09 NOTE — Progress Notes (Signed)
Patient ID: Julie King, female   DOB: 20-Jun-1991, 29 y.o.   MRN: [id number]  Chief Complaint  Patient presents with   GYN    HPI Julie King is a 29 y.o. female.  Breast lumps in right and left breasts.  S/P cesarean section in June 2021.  Did not breast feed.  History of breast CA in maternal GM and aunt in their 13's - 37's. HPI  Past Medical History:  Diagnosis Date   Atrial fibrillation ([place])    Headache 07/25/2017   Hypertension    Irritable bowel syndrome    Morbidly obese ([place]) 07/25/2017   QT prolongation    T wave inversion in EKG     Past Surgical History:  Procedure Laterality Date   CESAREAN SECTION     CESAREAN SECTION N/A 01/31/2020   Procedure: CESAREAN SECTION;  Surgeon: Chancy Milroy, MD;  Location: [facility];  Service: Obstetrics;  Laterality: N/A;   CHOLECYSTECTOMY     HERNIA REPAIR     TONSILLECTOMY      Family History  Problem Relation Age of Onset   Hypertension Other    Diabetes Other    Crohn's disease Mother    Hypertension Mother    Thyroid disease Father    Diabetes Father    Breast cancer Maternal Aunt        late 69 early 32s   Breast cancer Maternal Grandmother        late 49s early 13    Social History Social History   Tobacco Use   Smoking status: Former Smoker    Packs/day: 0.50    Years: 10.00    Pack years: 5.00    Types: Cigarettes   Smokeless tobacco: Never Used   Tobacco comment: wants to quit but states very difficult Jan 2020  Vaping Use   Vaping Use: Never used  Substance Use Topics   Alcohol use: Not Currently   Drug use: No    Allergies  Allergen Reactions   Percocet [Oxycodone-Acetaminophen] Hives, Itching and Nausea Only   Pineapple Itching    Fresh pineapples; pineapples from a can is okay    Current Outpatient Medications  Medication Sig Dispense Refill   albuterol (VENTOLIN HFA) 108 (90 Base) MCG/ACT inhaler Inhale 2 puffs into the lungs every 6 (six)  hours as needed for wheezing or shortness of breath. (Patient not taking: Reported on 06/09/2020) 8 g 0   cetirizine (ZYRTEC) 10 MG tablet Take 1 tablet (10 mg total) by mouth daily. (Patient not taking: Reported on 06/09/2020) 30 tablet 0   ibuprofen (ADVIL) 600 MG tablet Take 1 tablet (600 mg total) by mouth every 6 (six) hours as needed. (Patient not taking: Reported on 06/09/2020) 30 tablet 1   predniSONE (STERAPRED UNI-PAK 21 TAB) 10 MG (21) TBPK tablet Take by mouth daily. Per box instruction (Patient not taking: Reported on 06/09/2020) 21 tablet 0   No current facility-administered medications for this visit.    Review of Systems Review of Systems Constitutional: negative for fatigue and weight loss Respiratory: negative for cough and wheezing Cardiovascular: negative for chest pain, fatigue and palpitations Gastrointestinal: negative for abdominal pain and change in bowel habits Genitourinary:negative Integument/breast: positive for breast lumps Musculoskeletal:negative for myalgias Neurological: negative for gait problems and tremors Behavioral/Psych: negative for abusive relationship, depression Endocrine: negative for temperature intolerance      Blood pressure (!) 143/90, pulse (!) 102, height 5\' 7"  (1.702 m), weight 290 lb 9.6 oz (131.8  kg), last menstrual period 06/07/2020, not currently breastfeeding.  Physical Exam Physical Exam General:   alert and no distress  Skin:   no rash or abnormalities  Lungs:   clear to auscultation bilaterally  Heart:   regular rate and rhythm, S1, S2 normal, no murmur, click, rub or gallop  Breasts:   right breast:  Mass at 3 o'clock.  Left breast:  Mass at 9 o'clock.  Both masses are soft, mobile and non tender  Abdomen:  normal findings: no organomegaly, soft, non-tender and no hernia   Data Reviewed Vitals Family History  Assessment     1. Breast lump on right side at 3 o'clock position Rx: - MM DIAG BREAST TOMO BILATERAL;  Future  2. Breast lump on left side at 9 o'clock position Rx: - MM DIAG BREAST TOMO BILATERAL; Future  3. Class 3 severe obesity due to excess calories without serious comorbidity with body mass index (BMI) of 45.0 to 49.9 in adult [facility]) - program of caloric reduction, exercise and behavioral modification recommended    Plan   Follow up in 2 weeks - MyChart  Orders Placed This Encounter  Procedures   MM DIAG BREAST TOMO BILATERAL    Standing Status:   Future    Standing Expiration Date:   06/09/2021    Order Specific Question:   Reason for Exam (SYMPTOM  OR DIAGNOSIS REQUIRED)    Answer:   Breast lumps.    Order Specific Question:   Is the patient pregnant?    Answer:   No    Order Specific Question:   Preferred imaging location?    Answer:   [facility]      Shelly Bombard, MD 06/09/2020 10:57 AM

## 2020-06-10 ENCOUNTER — Other Ambulatory Visit (HOSPITAL_COMMUNITY): Payer: Self-pay | Admitting: Radiology

## 2020-06-10 DIAGNOSIS — J4540 Moderate persistent asthma, uncomplicated: Secondary | ICD-10-CM

## 2020-06-11 ENCOUNTER — Encounter: Payer: Self-pay | Admitting: Obstetrics

## 2020-06-11 ENCOUNTER — Other Ambulatory Visit (HOSPITAL_COMMUNITY): Payer: Self-pay | Admitting: Radiology

## 2020-06-11 DIAGNOSIS — J4540 Moderate persistent asthma, uncomplicated: Secondary | ICD-10-CM

## 2020-06-16 ENCOUNTER — Encounter: Payer: BC Managed Care – PPO | Admitting: Physical Therapy

## 2020-06-22 ENCOUNTER — Other Ambulatory Visit: Payer: Medicaid Other

## 2020-06-23 ENCOUNTER — Encounter: Payer: BC Managed Care – PPO | Admitting: Physical Therapy

## 2020-06-26 ENCOUNTER — Inpatient Hospital Stay (HOSPITAL_COMMUNITY): Admission: RE | Admit: 2020-06-26 | Payer: Medicaid Other | Source: Ambulatory Visit

## 2020-06-29 ENCOUNTER — Inpatient Hospital Stay (HOSPITAL_COMMUNITY): Admission: RE | Admit: 2020-06-29 | Payer: Medicaid Other | Source: Ambulatory Visit

## 2020-06-30 ENCOUNTER — Other Ambulatory Visit: Payer: Medicaid Other

## 2020-07-11 ENCOUNTER — Other Ambulatory Visit (HOSPITAL_COMMUNITY): Payer: Medicaid Other

## 2020-07-13 ENCOUNTER — Inpatient Hospital Stay (HOSPITAL_COMMUNITY): Admission: RE | Admit: 2020-07-13 | Payer: Medicaid Other | Source: Ambulatory Visit

## 2020-07-14 ENCOUNTER — Telehealth (INDEPENDENT_AMBULATORY_CARE_PROVIDER_SITE_OTHER): Payer: Medicaid Other | Admitting: Obstetrics

## 2020-07-14 ENCOUNTER — Telehealth: Payer: Self-pay

## 2020-07-14 ENCOUNTER — Other Ambulatory Visit (HOSPITAL_COMMUNITY): Payer: Medicaid Other

## 2020-07-14 DIAGNOSIS — N6325 Unspecified lump in the left breast, overlapping quadrants: Secondary | ICD-10-CM

## 2020-07-14 DIAGNOSIS — N6315 Unspecified lump in the right breast, overlapping quadrants: Secondary | ICD-10-CM

## 2020-07-14 NOTE — Progress Notes (Signed)
Patient did not answer phone call.  VM not open.  Shelly Bombard, MD 07/14/2020 11:08 AM

## 2020-07-14 NOTE — Telephone Encounter (Signed)
Unable to reach pt for Mychart visit today, VM not set up

## 2020-07-16 ENCOUNTER — Inpatient Hospital Stay (HOSPITAL_COMMUNITY)
Admission: RE | Admit: 2020-07-16 | Discharge: 2020-07-16 | Disposition: A | Discharge status: Home / Self Care | Payer: Medicaid Other | Source: Ambulatory Visit | Attending: Pulmonary Disease | Admitting: Pulmonary Disease

## 2020-09-14 ENCOUNTER — Other Ambulatory Visit: Payer: Self-pay

## 2020-09-15 ENCOUNTER — Ambulatory Visit (INDEPENDENT_AMBULATORY_CARE_PROVIDER_SITE_OTHER): Payer: Medicaid Other | Admitting: Obstetrics

## 2020-09-15 ENCOUNTER — Other Ambulatory Visit: Payer: Self-pay

## 2020-09-15 ENCOUNTER — Encounter: Payer: Self-pay | Admitting: Obstetrics

## 2020-09-15 DIAGNOSIS — Z872 Personal history of diseases of the skin and subcutaneous tissue: Secondary | ICD-10-CM | POA: Diagnosis not present

## 2020-09-15 DIAGNOSIS — N644 Mastodynia: Secondary | ICD-10-CM

## 2020-09-15 NOTE — Progress Notes (Addendum)
Patient ID: Julie King, female   DOB: March 08, 1991, 30 y.o.   MRN: [id number]  Chief Complaint  Patient presents with  . Follow-up    HPI. Julie King is a 30 y.o. female.  Shooting pain in left breast, and dimpling of the skin of left breast.  She has a family history of Breast Cancer in her maternal aunt and grandmother at early ages, and is somewhat anxious about getting breast cancer. HPI  Past Medical History:  Diagnosis Date  . Atrial fibrillation ([place])   . Headache 07/25/2017  . Hypertension   . Irritable bowel syndrome   . Morbidly obese ([place]) 07/25/2017  . QT prolongation   . T wave inversion in EKG     Past Surgical History:  Procedure Laterality Date  . CESAREAN SECTION    . CESAREAN SECTION N/A 01/31/2020   Procedure: CESAREAN SECTION;  Surgeon: Chancy Milroy, MD;  Location: [facility];  Service: Obstetrics;  Laterality: N/A;  . CHOLECYSTECTOMY    . HERNIA REPAIR    . TONSILLECTOMY      Family History  Problem Relation Age of Onset  . Hypertension Other   . Diabetes Other   . Crohn's disease Mother   . Hypertension Mother   . Thyroid disease Father   . Diabetes Father   . Breast cancer Maternal Aunt        late 56 early 74s  . Breast cancer Maternal Grandmother        late 31s early 60    Social History Social History   Tobacco Use  . Smoking status: Former Smoker    Packs/day: 0.50    Years: 10.00    Pack years: 5.00    Types: Cigarettes  . Smokeless tobacco: Never Used  . Tobacco comment: wants to quit but states very difficult Jan 2020  Vaping Use  . Vaping Use: Never used  Substance Use Topics  . Alcohol use: Not Currently  . Drug use: No    Allergies  Allergen Reactions  . Percocet [Oxycodone-Acetaminophen] Hives, Itching and Nausea Only  . Pineapple Itching    Fresh pineapples; pineapples from a can is okay    Current Outpatient Medications  Medication Sig Dispense Refill  . albuterol (VENTOLIN HFA) 108 (90 Base)  MCG/ACT inhaler Inhale 2 puffs into the lungs every 6 (six) hours as needed for wheezing or shortness of breath. (Patient not taking: Reported on 06/09/2020) 8 g 0  . cetirizine (ZYRTEC) 10 MG tablet Take 1 tablet (10 mg total) by mouth daily. (Patient not taking: Reported on 06/09/2020) 30 tablet 0  . ibuprofen (ADVIL) 600 MG tablet Take 1 tablet (600 mg total) by mouth every 6 (six) hours as needed. (Patient not taking: Reported on 06/09/2020) 30 tablet 1  . predniSONE (STERAPRED UNI-PAK 21 TAB) 10 MG (21) TBPK tablet Take by mouth daily. Per box instruction (Patient not taking: Reported on 06/09/2020) 21 tablet 0   No current facility-administered medications for this visit.    Review of Systems Review of Systems Constitutional: negative for fatigue and weight loss Respiratory: negative for cough and wheezing Cardiovascular: negative for chest pain, fatigue and palpitations Gastrointestinal: negative for abdominal pain and change in bowel habits Genitourinary:negative Integument/breast: positive for left breast pain and dimpling of the skin Musculoskeletal:negative for myalgias Neurological: negative for gait problems and tremors Behavioral/Psych: negative for abusive relationship, depression Endocrine: negative for temperature intolerance      not currently breastfeeding.  Physical Exam Physical  Exam General:   alert and no distress  Skin:   no rash or abnormalities  Lungs:   clear to auscultation bilaterally  Heart:   regular rate and rhythm, S1, S2 normal, no murmur, click, rub or gallop  Breasts:    Left:  Dimpling of skin at 3 o'clock.  No masses or tenderness    50% of 15 min visit spent on counseling and coordination of care.   Data Reviewed Breast Ultrasound  Media Information         Document Information  Procedure Note  [facility]--US Report  06/11/2020 08:29  Attached To:  Procedure Report - Scanned [[id number]]  Scanned Document on 06/11/20  with Shelly Bombard, MD   Source Information  [place] [facility]    Assessment and Plan    1. Mastodynia of left breast - probably hormonally induced Rx: - US BREAST LTD UNI LEFT INC AXILLA; Future  2. History of dimpling of breast skin Rx: - US BREAST LTD UNI LEFT INC AXILLA; Future    Plan   Follow up prn  Orders Placed This Encounter  Procedures  . MM DIAG BREAST TOMO BILATERAL    Standing Status:   Future    Standing Expiration Date:   09/15/2021    Order Specific Question:   Reason for Exam (SYMPTOM  OR DIAGNOSIS REQUIRED)    Answer:   Breast pain and dimpling of skin of left breast at 3 o'clock.    Order Specific Question:   Is the patient pregnant?    Answer:   No    Order Specific Question:   Preferred imaging location?    Answer:   [facility]      Shelly Bombard, MD 09/15/2020 1:47 PM

## 2020-09-29 ENCOUNTER — Telehealth: Payer: Self-pay | Admitting: Genetic Counselor

## 2020-09-29 ENCOUNTER — Other Ambulatory Visit: Payer: Self-pay | Admitting: General Surgery

## 2020-09-29 DIAGNOSIS — Z9189 Other specified personal risk factors, not elsewhere classified: Secondary | ICD-10-CM

## 2020-09-29 NOTE — Telephone Encounter (Signed)
Received a genetic counseling referral from Dr. Donne Hazel fo fhx of breast cancer. Julie King has been cld and scheduled to see Santiago Glad via [facility] video on 2/7 at [facility]. I verified the pt has an active mychart acct.

## 2020-10-02 ENCOUNTER — Ambulatory Visit
Admission: RE | Admit: 2020-10-02 | Discharge: 2020-10-02 | Disposition: A | Discharge status: Home / Self Care | Payer: Medicaid Other | Source: Ambulatory Visit | Attending: General Surgery | Admitting: General Surgery

## 2020-10-02 ENCOUNTER — Other Ambulatory Visit: Payer: Self-pay

## 2020-10-02 DIAGNOSIS — Z9189 Other specified personal risk factors, not elsewhere classified: Secondary | ICD-10-CM

## 2020-10-02 MED ORDER — GADOBUTROL 1 MMOL/ML IV SOLN
10.0000 mL | Freq: Once | INTRAVENOUS | Status: AC | PRN
  Administered 2020-10-02: 10 mL via INTRAVENOUS

## 2020-10-05 ENCOUNTER — Inpatient Hospital Stay: Payer: Medicaid Other | Attending: Genetic Counselor | Admitting: Genetic Counselor

## 2020-10-05 ENCOUNTER — Encounter: Payer: Self-pay | Admitting: Genetic Counselor

## 2020-10-05 DIAGNOSIS — Z803 Family history of malignant neoplasm of breast: Secondary | ICD-10-CM | POA: Diagnosis not present

## 2020-10-05 NOTE — Progress Notes (Signed)
REFERRING PROVIDER: Rolm Bookbinder, MD [place],  [place] [address]  PRIMARY PROVIDER:  Erling Cruz, MD  PRIMARY REASON FOR VISIT:  1. Family history of breast cancer      HISTORY OF PRESENT ILLNESS:   Ms. Welge, a 30 y.o. female, was seen for a [place] cancer genetics consultation at the request of Dr. Donne Hazel due to a family history of breast cancer and being calculated to be at high personal risk for breast cancer.  Ms. Hilyer presents to clinic today to discuss the possibility of a hereditary predisposition to cancer, genetic testing, and to further clarify her future cancer risks, as well as potential cancer risks for family members.   Ms. Cirelli is a 30 y.o. female with no personal history of cancer.  She is referred due to the family history of early onset of breast cancer.  CANCER HISTORY:  Oncology History   No history exists.     RISK FACTORS:  Menarche was at age 31.  First live birth at age 48.  Ovaries intact: yes.  Hysterectomy: no.  Menopausal status: premenopausal.  HRT use: 0 years. Colonoscopy: yes; normal. Mammogram within the last year: yes. Number of breast biopsies: 1. Up to date with pelvic exams: yes. Any excessive radiation exposure in the past: no  Past Medical History:  Diagnosis Date  . Atrial fibrillation ([place])   . Family history of breast cancer   . Headache 07/25/2017  . Hypertension   . Irritable bowel syndrome   . Morbidly obese ([place]) 07/25/2017  . QT prolongation   . T wave inversion in EKG     Past Surgical History:  Procedure Laterality Date  . CESAREAN SECTION    . CESAREAN SECTION N/A 01/31/2020   Procedure: CESAREAN SECTION;  Surgeon: Chancy Milroy, MD;  Location: [facility];  Service: Obstetrics;  Laterality: N/A;  . CHOLECYSTECTOMY    . HERNIA REPAIR    . TONSILLECTOMY      Social History   Socioeconomic History  . Marital status: Single    Spouse name: Not on file  . Number of  children: Not on file  . Years of education: Not on file  . Highest education level: Not on file  Occupational History  . Occupation: Barrister's clerk apartment  Tobacco Use  . Smoking status: Former Smoker    Packs/day: 0.50    Years: 10.00    Pack years: 5.00    Types: Cigarettes  . Smokeless tobacco: Never Used  . Tobacco comment: wants to quit but states very difficult Jan 2020  Vaping Use  . Vaping Use: Never used  Substance and Sexual Activity  . Alcohol use: Not Currently  . Drug use: No  . Sexual activity: Not Currently    Comment: with female  Other Topics Concern  . Not on file  Social History Narrative   Lives with partner   Caffeine use: Coffee daily   Soda sometimes   Right handed    Has one daughter   Social Determinants of Health   Financial Resource Strain: Not on file  Food Insecurity: Not on file  Transportation Needs: Not on file  Physical Activity: Not on file  Stress: Not on file  Social Connections: Not on file     FAMILY HISTORY:  We obtained a detailed, 4-generation family history.  Significant diagnoses are listed below: Family History  Problem Relation Age of Onset  . Hypertension Other   .  Diabetes Other   . Crohn's disease Mother   . Hypertension Mother   . Thyroid disease Father   . Diabetes Father   . Breast cancer Maternal Aunt 30  . Breast cancer Paternal Grandmother        dx under 55  . Breast cancer Other 8       MGMs sister, currently has stage IV    The patient has a daughter and a son for which she is the surrogate mother.  She has a full brother and sister, a maternal half brother and five paternal half brothers and sisters.  None have cancer.  Both parents are living.    The patient's father has thyroid problems.  He has one sister who is cancer free.  The paternal grandparents are deceased.  The grandmother had breast cancer diagnosed under age 85.    The patient's mother is living.  She has four maternal half sisters.   One had breast cancer at age 58 and died at 27.  The maternal grandmother is living and her grandfather is deceased.  The grandmother's sister diagnosed with breast cancer at 27.  Ms. Compere is unaware of previous family history of genetic testing for hereditary cancer risks. Patient's maternal ancestors are of [place] American and Caucasian descent, and paternal ancestors are of African American descent. There is no reported Ashkenazi Jewish ancestry. There is no known consanguinity.  GENETIC COUNSELING ASSESSMENT: Ms. Dimino is a 30 y.o. female with a family history of cancer which is somewhat suggestive of a hereditary cancer syndrome and predisposition to cancer given the number of women in the family with very early onset of breast cancer. We, therefore, discussed and recommended the following at today's visit.   DISCUSSION: We discussed that 5 - 10% of breast cancer is hereditary, with most cases associated with BRCA mutations.  There are other genes that can be associated with hereditary breast cancer syndromes.  These include ATM, CHEK2 and PALB2.  We discussed that testing is beneficial for several reasons including knowing how to follow individuals and understand if other family members could be at risk for cancer and allow them to undergo genetic testing.   We reviewed the characteristics, features and inheritance patterns of hereditary cancer syndromes. We also discussed genetic testing, including the appropriate family members to test, the process of testing, insurance coverage and turn-around-time for results. We discussed the implications of a negative, positive, carrier and/or variant of uncertain significant result. We recommended Ms. Vary pursue genetic testing for the CancerNext-Expanded+RNAinsight gene panel. The CancerNext-Expanded gene panel offered by [facility] and includes sequencing and rearrangement analysis for the following 77 genes: AIP, ALK, APC*, ATM*, AXIN2, BAP1, BARD1,  BLM, BMPR1A, BRCA1*, BRCA2*, BRIP1*, CDC73, CDH1*, CDK4, CDKN1B, CDKN2A, CHEK2*, CTNNA1, DICER1, FANCC, FH, FLCN, GALNT12, KIF1B, LZTR1, MAX, MEN1, MET, MLH1*, MSH2*, MSH3, MSH6*, MUTYH*, NBN, NF1*, NF2, NTHL1, PALB2*, PHOX2B, PMS2*, POT1, PRKAR1A, PTCH1, PTEN*, RAD51C*, RAD51D*, RB1, RECQL, RET, SDHA, SDHAF2, SDHB, SDHC, SDHD, SMAD4, SMARCA4, SMARCB1, SMARCE1, STK11, SUFU, TMEM127, TP53*, TSC1, TSC2, VHL and XRCC2 (sequencing and deletion/duplication); EGFR, EGLN1, HOXB13, KIT, MITF, PDGFRA, POLD1, and POLE (sequencing only); EPCAM and GREM1 (deletion/duplication only). DNA and RNA analyses performed for * genes.   Based on Ms. Freedman's family history of cancer, she meets medical criteria for genetic testing. Despite that she meets criteria, she may still have an out of pocket cost. We discussed that if her out of pocket cost for testing is over $100, the laboratory will call  and confirm whether she wants to proceed with testing.  If the out of pocket cost of testing is less than $100 she will be billed by the genetic testing laboratory.   PLAN: After considering the risks, benefits, and limitations, Ms. Villatoro provided informed consent to pursue genetic testing and the blood sample was sent to [facility] for analysis of the CancerNext-Expanded+RNAinsight. Results should be available within approximately 2-3 weeks' time, at which point they will be disclosed by telephone to Ms. Thede, as will any additional recommendations warranted by these results. Ms. Coalson will receive a summary of her genetic counseling visit and a copy of her results once available. This information will also be available in Epic.   Lastly, we encouraged Ms. Diaz to remain in contact with cancer genetics annually so that we can continuously update the family history and inform her of any changes in cancer genetics and testing that may be of benefit for this family.   Ms. Mainwaring questions were answered to her  satisfaction today. Our contact information was provided should additional questions or concerns arise. Thank you for the referral and allowing [place] to share in the care of your patient.   Sharee Sturdy P. Florene Glen, [place], [facility] Licensed, Insurance risk surveyor Santiago [email or web address] phone: [phone]  The patient was seen for a total of 45 minutes in face-to-face genetic counseling.  This patient was discussed with Drs. Magrinat, Lindi Adie and/or Burr Medico who agrees with the above.    _______________________________________________________________________ For Office Staff:  Number of people involved in session: 1 Was an Intern/ student involved with case: no

## 2020-10-06 ENCOUNTER — Other Ambulatory Visit: Payer: Medicaid Other

## 2020-10-20 ENCOUNTER — Other Ambulatory Visit: Payer: Medicaid Other

## 2020-10-23 ENCOUNTER — Other Ambulatory Visit: Payer: Medicaid Other

## 2020-10-29 ENCOUNTER — Telehealth: Payer: Self-pay | Admitting: Genetic Counselor

## 2020-10-29 NOTE — Telephone Encounter (Signed)
Called patient to see if she wants to r/s blood draw.  She is driving and needs me to call back.

## 2020-10-30 NOTE — Telephone Encounter (Signed)
The mailbox is full and can not accept any messages at this time.

## 2020-11-02 ENCOUNTER — Other Ambulatory Visit: Payer: Self-pay | Admitting: Genetic Counselor

## 2020-11-02 DIAGNOSIS — Z803 Family history of malignant neoplasm of breast: Secondary | ICD-10-CM

## 2020-11-02 NOTE — Telephone Encounter (Signed)
Patient r/s her blood draw for Wednesday, March 9 at 12:30.  Julie King

## 2020-11-04 ENCOUNTER — Inpatient Hospital Stay: Payer: Medicaid Other | Attending: Genetic Counselor

## 2020-11-08 ENCOUNTER — Encounter (HOSPITAL_COMMUNITY): Payer: Self-pay

## 2020-11-08 ENCOUNTER — Emergency Department (HOSPITAL_COMMUNITY)
Admission: EM | Admit: 2020-11-08 | Discharge: 2020-11-09 | Disposition: A | Discharge status: Home / Self Care | Payer: Medicaid Other | Attending: Emergency Medicine | Admitting: Emergency Medicine

## 2020-11-08 ENCOUNTER — Emergency Department (HOSPITAL_COMMUNITY): Payer: Medicaid Other

## 2020-11-08 ENCOUNTER — Other Ambulatory Visit: Payer: Self-pay

## 2020-11-08 DIAGNOSIS — R251 Tremor, unspecified: Secondary | ICD-10-CM | POA: Insufficient documentation

## 2020-11-08 DIAGNOSIS — M79604 Pain in right leg: Secondary | ICD-10-CM | POA: Diagnosis not present

## 2020-11-08 DIAGNOSIS — R531 Weakness: Secondary | ICD-10-CM | POA: Diagnosis not present

## 2020-11-08 DIAGNOSIS — R002 Palpitations: Secondary | ICD-10-CM | POA: Diagnosis present

## 2020-11-08 DIAGNOSIS — Z5321 Procedure and treatment not carried out due to patient leaving prior to being seen by health care provider: Secondary | ICD-10-CM | POA: Diagnosis not present

## 2020-11-08 LAB — BASIC METABOLIC PANEL
Anion gap: 9 (ref 5–15)
BUN: 8 mg/dL (ref 6–20)
CO2: 25 mmol/L (ref 22–32)
Calcium: 9.5 mg/dL (ref 8.9–10.3)
Chloride: 104 mmol/L (ref 98–111)
Creatinine, Ser: 0.67 mg/dL (ref 0.44–1.00)
GFR, Estimated: >60 mL/min (ref >60)
Glucose, Bld: 82 mg/dL (ref 70–99)
Potassium: 4 mmol/L (ref 3.5–5.1)
Sodium: 138 mmol/L (ref 135–145)

## 2020-11-08 LAB — CBC
HCT: 35.8 % — ABNORMAL LOW (ref 36.0–46.0)
Hemoglobin: 10.7 g/dL — ABNORMAL LOW (ref 12.0–15.0)
MCH: 18.1 pg — ABNORMAL LOW (ref 26.0–34.0)
MCHC: 29.9 g/dL — ABNORMAL LOW (ref 30.0–36.0)
MCV: 60.6 fL — ABNORMAL LOW (ref 80.0–100.0)
Platelets: 257 10*3/uL (ref 150–400)
RBC: 5.91 MIL/uL — ABNORMAL HIGH (ref 3.87–5.11)
RDW: 19 % — ABNORMAL HIGH (ref 11.5–15.5)
WBC: 12.9 10*3/uL — ABNORMAL HIGH (ref 4.0–10.5)
nRBC: 0.2 % (ref 0.0–0.2)

## 2020-11-08 LAB — TROPONIN I (HIGH SENSITIVITY): Troponin I (High Sensitivity): 3 ng/L (ref <18)

## 2020-11-08 LAB — I-STAT BETA HCG BLOOD, ED (MC, WL, AP ONLY): I-stat hCG, quantitative: <5 m[IU]/mL (ref <5)

## 2020-11-08 NOTE — ED Triage Notes (Signed)
Pt reports that she is having heart palpitations that are intermittent. Having a pain to right leg, feeling shaky and weak. History of A- fib. Does no take a blood thinner.

## 2020-11-08 NOTE — ED Notes (Signed)
Patient left on own accord °

## 2020-11-11 ENCOUNTER — Other Ambulatory Visit: Payer: Self-pay

## 2020-11-11 ENCOUNTER — Emergency Department (HOSPITAL_COMMUNITY)
Admission: EM | Admit: 2020-11-11 | Discharge: 2020-11-11 | Disposition: A | Discharge status: Home / Self Care | Payer: Medicaid Other | Attending: Emergency Medicine | Admitting: Emergency Medicine

## 2020-11-11 ENCOUNTER — Encounter (HOSPITAL_COMMUNITY): Payer: Self-pay

## 2020-11-11 DIAGNOSIS — Z885 Allergy status to narcotic agent status: Secondary | ICD-10-CM | POA: Diagnosis not present

## 2020-11-11 DIAGNOSIS — Z87891 Personal history of nicotine dependence: Secondary | ICD-10-CM | POA: Insufficient documentation

## 2020-11-11 DIAGNOSIS — I10 Essential (primary) hypertension: Secondary | ICD-10-CM | POA: Insufficient documentation

## 2020-11-11 DIAGNOSIS — M79605 Pain in left leg: Secondary | ICD-10-CM | POA: Insufficient documentation

## 2020-11-11 DIAGNOSIS — R002 Palpitations: Secondary | ICD-10-CM | POA: Diagnosis present

## 2020-11-11 DIAGNOSIS — I4891 Unspecified atrial fibrillation: Secondary | ICD-10-CM | POA: Insufficient documentation

## 2020-11-11 DIAGNOSIS — M79604 Pain in right leg: Secondary | ICD-10-CM | POA: Diagnosis not present

## 2020-11-11 LAB — I-STAT BETA HCG BLOOD, ED (MC, WL, AP ONLY): I-stat hCG, quantitative: <5 m[IU]/mL (ref <5)

## 2020-11-11 LAB — BASIC METABOLIC PANEL
Anion gap: 7 (ref 5–15)
BUN: 8 mg/dL (ref 6–20)
CO2: 24 mmol/L (ref 22–32)
Calcium: 8.9 mg/dL (ref 8.9–10.3)
Chloride: 107 mmol/L (ref 98–111)
Creatinine, Ser: 0.68 mg/dL (ref 0.44–1.00)
GFR, Estimated: >60 mL/min (ref >60)
Glucose, Bld: 83 mg/dL (ref 70–99)
Potassium: 4.3 mmol/L (ref 3.5–5.1)
Sodium: 138 mmol/L (ref 135–145)

## 2020-11-11 LAB — CBC
HCT: 35.6 % — ABNORMAL LOW (ref 36.0–46.0)
Hemoglobin: 10.5 g/dL — ABNORMAL LOW (ref 12.0–15.0)
MCH: 18.3 pg — ABNORMAL LOW (ref 26.0–34.0)
MCHC: 29.5 g/dL — ABNORMAL LOW (ref 30.0–36.0)
MCV: 61.9 fL — ABNORMAL LOW (ref 80.0–100.0)
Platelets: 282 10*3/uL (ref 150–400)
RBC: 5.75 MIL/uL — ABNORMAL HIGH (ref 3.87–5.11)
RDW: 19.3 % — ABNORMAL HIGH (ref 11.5–15.5)
WBC: 13.7 10*3/uL — ABNORMAL HIGH (ref 4.0–10.5)
nRBC: 0.2 % (ref 0.0–0.2)

## 2020-11-11 LAB — TSH: TSH: 1.319 u[IU]/mL (ref 0.350–4.500)

## 2020-11-11 LAB — TROPONIN I (HIGH SENSITIVITY): Troponin I (High Sensitivity): 2 ng/L (ref <18)

## 2020-11-11 NOTE — ED Triage Notes (Signed)
Patient complains of 2 days of intermittent chest palpitations and right upper and lower leg pain for same. Patient denies CP. States that she has had a. Fib previously and taken off meds due to pregnancy, hasn't resumed meds since birth of child

## 2020-11-11 NOTE — ED Provider Notes (Signed)
[facility] EMERGENCY DEPARTMENT Provider Note   CSN: [id number] Arrival date & time: 11/11/20  1117     History No chief complaint on file.   Julie King is a 30 y.o. female.  30 year old female with history of atrial fibrillation and prolonged QT presents with complaint of heart fluttering for the past 2 days.  Patient denies associated shortness of breath or chest pain.  Patient came to the emergency room 2 days ago for same however left due to busy lobby.  Patient is scheduled to follow-up with her cardiologist this afternoon.  Patient was previously anticoagulated for A. fib however anticoagulant was discontinued during pregnancy, delivered June of last year in a surrogacy arrangement.  Also reports pain in her right and left legs, alternating in locations without recent falls or injuries, no lower extremity edema.  No other complaints or concerns.        Past Medical History:  Diagnosis Date  . Atrial fibrillation ([place])   . Family history of breast cancer   . Headache 07/25/2017  . Hypertension   . Irritable bowel syndrome   . Morbidly obese ([place] ) 07/25/2017  . QT prolongation   . T wave inversion in EKG     Patient Active Problem List   Diagnosis Date Noted  . Family history of breast cancer   . Essential hypertension 03/16/2020  . History of atrial fibrillation 11/28/2019  . Headache 07/25/2017  . Morbidly obese ([place]) 07/25/2017    Past Surgical History:  Procedure Laterality Date  . CESAREAN SECTION    . CESAREAN SECTION N/A 01/31/2020   Procedure: CESAREAN SECTION;  Surgeon: Chancy Milroy, MD;  Location: [facility];  Service: Obstetrics;  Laterality: N/A;  . CHOLECYSTECTOMY    . HERNIA REPAIR    . TONSILLECTOMY       OB History    Gravida  2   Para  2   Term  1   Preterm  1   AB      Living  2     SAB      IAB      Ectopic      Multiple  0   Live Births  2           Family History  Problem Relation Age  of Onset  . Hypertension Other   . Diabetes Other   . Crohn's disease Mother   . Hypertension Mother   . Thyroid disease Father   . Diabetes Father   . Breast cancer Maternal Aunt 30  . Breast cancer Paternal Grandmother        dx under 49  . Breast cancer Other 54       MGMs sister, currently has stage IV    Social History   Tobacco Use  . Smoking status: Former Smoker    Packs/day: 0.50    Years: 10.00    Pack years: 5.00    Types: Cigarettes  . Smokeless tobacco: Never Used  . Tobacco comment: wants to quit but states very difficult Jan 2020  Vaping Use  . Vaping Use: Never used  Substance Use Topics  . Alcohol use: Not Currently  . Drug use: No    Home Medications Prior to Admission medications   Medication Sig Start Date End Date Taking? Authorizing Provider  albuterol (VENTOLIN HFA) 108 (90 Base) MCG/ACT inhaler Inhale 2 puffs into the lungs every 6 (six) hours as needed for wheezing or shortness of breath.  04/22/20  Yes Zigmund Gottron, NP    Allergies    Percocet [oxycodone-acetaminophen] and Pineapple  Review of Systems   Review of Systems  Constitutional: Negative for fever.  Respiratory: Negative for shortness of breath.   Cardiovascular: Positive for palpitations. Negative for chest pain and leg swelling.  Gastrointestinal: Negative for abdominal pain, nausea and vomiting.  Musculoskeletal: Negative for arthralgias, back pain and myalgias.  Skin: Negative for rash and wound.  Allergic/Immunologic: Negative for immunocompromised state.  Neurological: Negative for weakness and numbness.  Hematological: Does not bruise/bleed easily.  All other systems reviewed and are negative.   Physical Exam Updated Vital Signs BP 109/69   Pulse 91   Temp (!) 97.5 F (36.4 C) (Oral)   Resp 13   LMP 10/25/2020 (Approximate)   SpO2 99%   Physical Exam Vitals and nursing note reviewed.  Constitutional:      General: She is not in acute distress.     Appearance: She is well-developed. She is not diaphoretic.  HENT:     Head: Normocephalic and atraumatic.  Cardiovascular:     Rate and Rhythm: Normal rate and regular rhythm.     Pulses: Normal pulses.     Heart sounds: Normal heart sounds.  Pulmonary:     Effort: Pulmonary effort is normal.     Breath sounds: Normal breath sounds.  Abdominal:     Palpations: Abdomen is soft.     Tenderness: There is no abdominal tenderness.  Musculoskeletal:        General: No swelling, tenderness, deformity or signs of injury. Normal range of motion.     Thoracic back: No tenderness or bony tenderness.     Lumbar back: No tenderness or bony tenderness. Negative right straight leg raise test and negative left straight leg raise test.     Right lower leg: No edema.     Left lower leg: No edema.  Skin:    General: Skin is warm and dry.     Findings: No erythema or rash.  Neurological:     Mental Status: She is alert and oriented to person, place, and time.  Psychiatric:        Behavior: Behavior normal.     ED Results / Procedures / Treatments   Labs (all labs ordered are listed, but only abnormal results are displayed) Labs Reviewed  CBC - Abnormal; Notable for the following components:      Result Value   WBC 13.7 (*)    RBC 5.75 (*)    Hemoglobin 10.5 (*)    HCT 35.6 (*)    MCV 61.9 (*)    MCH 18.3 (*)    MCHC 29.5 (*)    RDW 19.3 (*)    All other components within normal limits  BASIC METABOLIC PANEL  TSH  I-STAT BETA HCG BLOOD, ED ([facility], [facility], [facility] ONLY)  TROPONIN I (HIGH SENSITIVITY)  TROPONIN I (HIGH SENSITIVITY)    EKG EKG Interpretation  Date/Time:  Wednesday November 11 2020 11:30:01 EDT Ventricular Rate:  97 PR Interval:  136 QRS Duration: 90 QT Interval:  362 QTC Calculation: 459 R Axis:   63 Text Interpretation: Normal sinus rhythm T wave abnormality, consider inferior ischemia No significant change since last tracing Confirmed by Blanchie Dessert [phone]) on 11/11/2020  12:49:04 PM   Radiology No results found.  Procedures Procedures   Medications Ordered in ED Medications - No data to display  ED Course  I have reviewed the triage vital signs  and the nursing notes.  Pertinent labs & imaging results that were available during my care of the patient were reviewed by me and considered in my medical decision making (see chart for details).  Clinical Course as of 11/11/20 1531  Wed Nov 12, 3086  [id number] 30 year old female with complaint of palpitations and lower extremity pain as above.  Patient's labs today were reviewed and commendation with lab work drawn 2 days ago in the emergency room. On exam, heart is regular rate and rhythm, EKG shows normal sinus rhythm, not currently in A. fib, no changes from prior.  Troponin is 2, unchanged from troponin II days ago.  BMP is unremarkable, CBC with leukocytosis white count of 13.7, hemoglobin 10.5, unchanged from prior.  hCG is negative.  TSH is normal at 1.319. In regards to her lower extremity pain, she has been musculoskeletal tenderness, pain is not reproduced with straight leg raise, calves are soft and nontender, no lower extremity edema.  Advised to try course of ibuprofen and follow-up with her PCP if pain continues.  In regards to her palpitations, patient is scheduled to see her cardiologist in 2 hours, plan is for her to follow-up with her provider today, return to the ED for worsening or concerning symptoms. [LM]    Clinical Course User Index [LM] Roque Lias   MDM Rules/Calculators/A&P                          Final Clinical Impression(s) / ED Diagnoses Final diagnoses:  Palpitations    Rx / DC Orders ED Discharge Orders    None       Tacy Learn, PA-C 11/11/20 1531    Blanchie Dessert, MD 11/14/20 2256

## 2020-11-11 NOTE — Discharge Instructions (Addendum)
Limit caffeine intake. Follow-up with your cardiologist today as scheduled.  Return to the emergency room for worsening or concerning symptoms.

## 2020-12-11 ENCOUNTER — Telehealth: Payer: Self-pay | Admitting: Genetic Counselor

## 2020-12-11 NOTE — Telephone Encounter (Signed)
LM on VM that I noticed she had missed her appointment for her blood draw in March and I wanted to see if she wanted to r/s it.  Asked her to please CB and left CB instructions.

## 2020-12-26 ENCOUNTER — Other Ambulatory Visit: Payer: Self-pay

## 2020-12-26 ENCOUNTER — Emergency Department (HOSPITAL_COMMUNITY): Payer: Medicaid Other

## 2020-12-26 ENCOUNTER — Encounter (HOSPITAL_COMMUNITY): Payer: Self-pay

## 2020-12-26 ENCOUNTER — Emergency Department (HOSPITAL_COMMUNITY)
Admission: EM | Admit: 2020-12-26 | Discharge: 2020-12-26 | Disposition: A | Discharge status: Home / Self Care | Payer: Medicaid Other | Attending: Emergency Medicine | Admitting: Emergency Medicine

## 2020-12-26 DIAGNOSIS — Z87891 Personal history of nicotine dependence: Secondary | ICD-10-CM | POA: Diagnosis not present

## 2020-12-26 DIAGNOSIS — R1084 Generalized abdominal pain: Secondary | ICD-10-CM | POA: Insufficient documentation

## 2020-12-26 DIAGNOSIS — R072 Precordial pain: Secondary | ICD-10-CM | POA: Insufficient documentation

## 2020-12-26 DIAGNOSIS — R0789 Other chest pain: Secondary | ICD-10-CM | POA: Diagnosis present

## 2020-12-26 DIAGNOSIS — I10 Essential (primary) hypertension: Secondary | ICD-10-CM | POA: Insufficient documentation

## 2020-12-26 DIAGNOSIS — R110 Nausea: Secondary | ICD-10-CM | POA: Diagnosis not present

## 2020-12-26 DIAGNOSIS — K644 Residual hemorrhoidal skin tags: Secondary | ICD-10-CM | POA: Diagnosis not present

## 2020-12-26 DIAGNOSIS — R197 Diarrhea, unspecified: Secondary | ICD-10-CM | POA: Insufficient documentation

## 2020-12-26 LAB — COMPREHENSIVE METABOLIC PANEL
ALT: 13 U/L (ref 0–44)
AST: 14 U/L — ABNORMAL LOW (ref 15–41)
Albumin: 3.5 g/dL (ref 3.5–5.0)
Alkaline Phosphatase: 66 U/L (ref 38–126)
Anion gap: 8 (ref 5–15)
BUN: 10 mg/dL (ref 6–20)
CO2: 23 mmol/L (ref 22–32)
Calcium: 8.8 mg/dL — ABNORMAL LOW (ref 8.9–10.3)
Chloride: 108 mmol/L (ref 98–111)
Creatinine, Ser: 0.64 mg/dL (ref 0.44–1.00)
GFR, Estimated: >60 mL/min (ref >60)
Glucose, Bld: 92 mg/dL (ref 70–99)
Potassium: 3.7 mmol/L (ref 3.5–5.1)
Sodium: 139 mmol/L (ref 135–145)
Total Bilirubin: 0.3 mg/dL (ref 0.3–1.2)
Total Protein: 7 g/dL (ref 6.5–8.1)

## 2020-12-26 LAB — CBC WITH DIFFERENTIAL/PLATELET
Abs Immature Granulocytes: 0.05 10*3/uL (ref 0.00–0.07)
Basophils Absolute: 0.1 10*3/uL (ref 0.0–0.1)
Basophils Relative: 1 %
Eosinophils Absolute: 0.5 10*3/uL (ref 0.0–0.5)
Eosinophils Relative: 4 %
HCT: 32.5 % — ABNORMAL LOW (ref 36.0–46.0)
Hemoglobin: 10 g/dL — ABNORMAL LOW (ref 12.0–15.0)
Immature Granulocytes: 0 %
Lymphocytes Relative: 29 %
Lymphs Abs: 3.9 10*3/uL (ref 0.7–4.0)
MCH: 18.4 pg — ABNORMAL LOW (ref 26.0–34.0)
MCHC: 30.8 g/dL (ref 30.0–36.0)
MCV: 59.7 fL — ABNORMAL LOW (ref 80.0–100.0)
Monocytes Absolute: 0.7 10*3/uL (ref 0.1–1.0)
Monocytes Relative: 5 %
Neutro Abs: 8.3 10*3/uL — ABNORMAL HIGH (ref 1.7–7.7)
Neutrophils Relative %: 61 %
Platelets: 250 10*3/uL (ref 150–400)
RBC: 5.44 MIL/uL — ABNORMAL HIGH (ref 3.87–5.11)
RDW: 18.7 % — ABNORMAL HIGH (ref 11.5–15.5)
WBC: 13.5 10*3/uL — ABNORMAL HIGH (ref 4.0–10.5)
nRBC: 0 % (ref 0.0–0.2)

## 2020-12-26 LAB — URINALYSIS, ROUTINE W REFLEX MICROSCOPIC
Bilirubin Urine: NEGATIVE
Glucose, UA: NEGATIVE mg/dL
Hgb urine dipstick: NEGATIVE
Ketones, ur: NEGATIVE mg/dL
Leukocytes,Ua: NEGATIVE
Nitrite: NEGATIVE
Protein, ur: NEGATIVE mg/dL
Specific Gravity, Urine: 1.024 (ref 1.005–1.030)
pH: 6 (ref 5.0–8.0)

## 2020-12-26 LAB — POC OCCULT BLOOD, ED: Fecal Occult Bld: NEGATIVE

## 2020-12-26 LAB — TROPONIN I (HIGH SENSITIVITY): Troponin I (High Sensitivity): 3 ng/L (ref <18)

## 2020-12-26 LAB — LIPASE, BLOOD: Lipase: 38 U/L (ref 11–51)

## 2020-12-26 LAB — HCG, QUANTITATIVE, PREGNANCY: hCG, Beta Chain, Quant, S: <1 m[IU]/mL (ref <5)

## 2020-12-26 MED ORDER — ALUM & MAG HYDROXIDE-SIMETH 200-200-20 MG/5ML PO SUSP
30.0000 mL | Freq: Once | ORAL | Status: AC
  Administered 2020-12-26: 30 mL via ORAL
  Filled 2020-12-26: qty 30

## 2020-12-26 MED ORDER — NITROGLYCERIN 0.4 MG SL SUBL
0.4000 mg | SUBLINGUAL_TABLET | SUBLINGUAL | Status: DC | PRN
  Filled 2020-12-26: qty 1

## 2020-12-26 MED ORDER — ONDANSETRON 4 MG PO TBDP: 4.0000 mg | ORAL_TABLET | Freq: Three times a day (TID) | ORAL | 0 refills | Status: DC | PRN

## 2020-12-26 MED ORDER — LIDOCAINE VISCOUS HCL 2 % MT SOLN
15.0000 mL | Freq: Once | OROMUCOSAL | Status: AC
  Administered 2020-12-26: 15 mL via ORAL
  Filled 2020-12-26: qty 15

## 2020-12-26 NOTE — ED Provider Notes (Signed)
[facility] DEPT Provider Note   CSN: [id number] Arrival date & time: 12/26/20  1850     History No chief complaint on file.   Julie King is a 30 y.o. female with a history of atrial fibrillation not on any anticoagulation or medication, hypertension, morbid obesity, IBS.  Patient presents with a chief complaint of chest tightness as well as nausea and diarrhea.  Patient reports that her chest tightness started on Thursday night.  Patient reported improvement in her symptoms all throughout Friday however began having chest tightness again this morning.  Chest tightness began at 1130.  Chest tightness was constant up until patient received her first nitroglycerin with EMS.  Since then patient has had intermittent chest tightness.   Chest tightness is midsternal without radiation.  Patient reports her symptom started at rest.  Patient reports that chest tightness is worse with ambulation.  Patient denies any associated nausea, vomiting, diaphoresis, or shortness of breath.    Patient endorses nausea, diarrhea, and generalized abdominal cramping over the last 1 to 2 weeks.  Patient denies any vomiting.  Patient endorses bloody stool.  Patient reports blood is bright red.  Bleeding is not observed on all bowel movements.  Patient states that she has a history of IBS and has frequent diarrhea with bloody stool in the past.    Patient denies any fevers, chills, URI symptoms, urinary symptoms, vaginal bleeding, vaginal discharge, vaginal pain, lightheadedness, dizziness, syncopal episode.  Patient denies any unilateral leg swelling or tenderness, history of DVT or PE, hormone therapy, surgery or traumatic injury in the last 12 weeks, hemoptysis, personal history of cancer.  LMP 3/12.  Patient reports she is sexually active with female partner.  HPI     Past Medical History:  Diagnosis Date  . Atrial fibrillation ([place])   . Family history of breast cancer    . Headache 07/25/2017  . Hypertension   . Irritable bowel syndrome   . Morbidly obese ([place]) 07/25/2017  . QT prolongation   . T wave inversion in EKG     Patient Active Problem List   Diagnosis Date Noted  . Family history of breast cancer   . Essential hypertension 03/16/2020  . History of atrial fibrillation 11/28/2019  . Headache 07/25/2017  . Morbidly obese ([place]) 07/25/2017    Past Surgical History:  Procedure Laterality Date  . CESAREAN SECTION    . CESAREAN SECTION N/A 01/31/2020   Procedure: CESAREAN SECTION;  Surgeon: Chancy Milroy, MD;  Location: [facility];  Service: Obstetrics;  Laterality: N/A;  . CHOLECYSTECTOMY    . HERNIA REPAIR    . TONSILLECTOMY       OB History    Gravida  2   Para  2   Term  1   Preterm  1   AB      Living  2     SAB      IAB      Ectopic      Multiple  0   Live Births  2           Family History  Problem Relation Age of Onset  . Hypertension Other   . Diabetes Other   . Crohn's disease Mother   . Hypertension Mother   . Thyroid disease Father   . Diabetes Father   . Breast cancer Maternal Aunt 30  . Breast cancer Paternal Grandmother        dx under 16  .  Breast cancer Other 59       MGMs sister, currently has stage IV    Social History   Tobacco Use  . Smoking status: Former Smoker    Packs/day: 0.50    Years: 10.00    Pack years: 5.00    Types: Cigarettes  . Smokeless tobacco: Never Used  . Tobacco comment: wants to quit but states very difficult Jan 2020  Vaping Use  . Vaping Use: Never used  Substance Use Topics  . Alcohol use: Not Currently  . Drug use: No    Home Medications Prior to Admission medications   Medication Sig Start Date End Date Taking? Authorizing Provider  albuterol (VENTOLIN HFA) 108 (90 Base) MCG/ACT inhaler Inhale 2 puffs into the lungs every 6 (six) hours as needed for wheezing or shortness of breath. 04/22/20   Zigmund Gottron, NP    Allergies    Percocet  [oxycodone-acetaminophen] and Pineapple  Review of Systems   Review of Systems  Constitutional: Negative for chills, diaphoresis, fever and unexpected weight change.  HENT: Negative for congestion, rhinorrhea and sore throat.   Eyes: Negative for visual disturbance.  Respiratory: Negative for cough and shortness of breath.   Cardiovascular: Positive for chest pain. Negative for palpitations and leg swelling.  Gastrointestinal: Positive for abdominal pain, blood in stool, diarrhea and nausea. Negative for abdominal distention, anal bleeding, constipation, rectal pain and vomiting.  Genitourinary: Negative for difficulty urinating, dysuria, flank pain, frequency, genital sores, hematuria, urgency, vaginal bleeding, vaginal discharge and vaginal pain.  Musculoskeletal: Negative for back pain and neck pain.  Skin: Negative for color change and rash.  Neurological: Negative for dizziness, syncope, light-headedness and headaches.  Psychiatric/Behavioral: Negative for confusion.    Physical Exam Updated Vital Signs BP 138/78 (BP Location: Right Arm)   Pulse 78   Temp 98.9 F (37.2 C) (Oral)   Resp 20   SpO2 100%   Physical Exam Vitals and nursing note reviewed. Exam conducted with a chaperone present (Female nurse tech present).  Constitutional:      General: She is not in acute distress.    Appearance: She is obese. She is not ill-appearing, toxic-appearing or diaphoretic.  HENT:     Head: Normocephalic.  Eyes:     General: No scleral icterus.       Right eye: No discharge.        Left eye: No discharge.  Cardiovascular:     Rate and Rhythm: Normal rate and regular rhythm.     Heart sounds: Normal heart sounds.  Pulmonary:     Effort: Pulmonary effort is normal. No tachypnea, bradypnea or respiratory distress.     Breath sounds: Normal breath sounds. No stridor.  Abdominal:     General: A surgical scar is present. Bowel sounds are normal. There is no distension. There are no  signs of injury.     Palpations: Abdomen is soft. There is no mass or pulsatile mass.     Tenderness: There is no abdominal tenderness. There is no guarding or rebound.     Hernia: There is no hernia in the umbilical area or ventral area.     Comments: exam hindered by body habitus  Laparoscopic surgical scars observed  Genitourinary:    Rectum: Guaiac result negative. External hemorrhoid present. No mass, tenderness, anal fissure or internal hemorrhoid. Normal anal tone.     Comments: External hemorrhoid noted at 9 o'clock position  No frank blood or melena noted on gloved hand after  rectal exam. Musculoskeletal:     Cervical back: Neck supple.     Right lower leg: No swelling or tenderness. No edema.     Left lower leg: No swelling or tenderness. No edema.  Skin:    General: Skin is warm and dry.     Coloration: Skin is not jaundiced or pale.  Neurological:     General: No focal deficit present.     Mental Status: She is alert.     GCS: GCS eye subscore is 4. GCS verbal subscore is 5. GCS motor subscore is 6.  Psychiatric:        Behavior: Behavior is cooperative.     ED Results / Procedures / Treatments   Labs (all labs ordered are listed, but only abnormal results are displayed) Labs Reviewed  COMPREHENSIVE METABOLIC PANEL - Abnormal; Notable for the following components:      Result Value   Calcium 8.8 (*)    AST 14 (*)    All other components within normal limits  CBC WITH DIFFERENTIAL/PLATELET - Abnormal; Notable for the following components:   WBC 13.5 (*)    RBC 5.44 (*)    Hemoglobin 10.0 (*)    HCT 32.5 (*)    MCV 59.7 (*)    MCH 18.4 (*)    RDW 18.7 (*)    Neutro Abs 8.3 (*)    All other components within normal limits  LIPASE, BLOOD  URINALYSIS, ROUTINE W REFLEX MICROSCOPIC  HCG, QUANTITATIVE, PREGNANCY  POC OCCULT BLOOD, ED  TROPONIN I (HIGH SENSITIVITY)    EKG EKG Interpretation  Date/Time:  Saturday December 26 2020 19:16:54 EDT Ventricular Rate:   85 PR Interval:  132 QRS Duration: 94 QT Interval:  347 QTC Calculation: 413 R Axis:   64 Text Interpretation: Sinus rhythm Borderline T abnormalities, diffuse leads No significant change since last tracing Confirmed by Deno Etienne [phone]) on 12/26/2020 9:57:55 PM   Radiology DG Chest Port 1 View  Result Date: 12/26/2020 CLINICAL DATA:  Chest tightness EXAM: PORTABLE CHEST 1 VIEW COMPARISON:  November 08, 2020 FINDINGS: The heart size and mediastinal contours are within normal limits. Both lungs are clear. The visualized skeletal structures are unremarkable. IMPRESSION: No active disease. Electronically Signed   By: Constance Holster M.D.   On: 12/26/2020 20:38    Procedures Procedures   Medications Ordered in ED Medications  nitroGLYCERIN (NITROSTAT) SL tablet 0.4 mg (has no administration in time range)  alum & mag hydroxide-simeth (MAALOX/MYLANTA) 200-200-20 MG/5ML suspension 30 mL (30 mLs Oral Given 12/26/20 2042)    And  lidocaine (XYLOCAINE) 2 % viscous mouth solution 15 mL (15 mLs Oral Given 12/26/20 2042)    ED Course  I have reviewed the triage vital signs and the nursing notes.  Pertinent labs & imaging results that were available during my care of the patient were reviewed by me and considered in my medical decision making (see chart for details).    MDM Rules/Calculators/A&P                          Alert 30 year old female no acute distress, nontoxic appearing.  Patient is resting comfortably on her cell phone.  Patient presents with chief complaint of chest pain, nausea, diarrhea.    Patient reports having chest pain Thursday night which resolved that evening.  Patient reports chest pain started today at 1100.  Pain was constant up until receiving sublingual nitro with EMS in route to  hospital.  Since then patient reports that her chest tightness has been intermittent.  Chest tightness midsternal with no radiation.  States chest tightness is worse with ambulation.  No  associated nausea, vomiting, diaphoresis, shortness of breath.  Will obtain chest x-ray, EKG, troponin. Chest x-ray shows no active cardiopulmonary disease. EKG shows normal sinus rhythm; no significant change from previous tracing. Troponin 3. With patient's heart score of 2 and chest pain being present for more than 6 hours prior to arrival in emergency department low suspicion for ACS.  We will have patient follow-up with her primary care provider.  Patient endorses nausea, diarrhea, and generalized abdominal cramping over the last 1 to 2 weeks.  Patient also endorses bright red blood in her stool intermittently.  Patient has a history of IBS and reports similar symptoms in the past.  We will obtain CBC, CMP, lipase, UA, Hemoccult. On physical exam Patient's abdomen is soft, nondistended, nontender.  External hemorrhoid noted at 9 o'clock position. Pregnancy test negative; low suspicion for ectopic pregnancy or intrauterine pregnancy. Lipase within normal limits; low suspicion for acute pancreatitis. CMP within normal limits; low suspicion for hepatobiliary disease. Urinalysis shows no signs of infection. Patient noted to have leukocytosis at 13.5 and anemia with hemoglobin 10 and hematocrit 32.5.  Patient's anemia appears to be baseline for her compared to previous lab values. Low suspicion for intra-abdominal infection as patient has no tenderness throughout her abdomen.  Patient hemodynamically stable throughout emergency department stay.  Discussed results, findings, treatment and follow up. Patient advised of return precautions. Patient verbalized understanding and agreed with plan.     Final Clinical Impression(s) / ED Diagnoses Final diagnoses:  Precordial chest pain  Nausea  Diarrhea, unspecified type    Rx / DC Orders ED Discharge Orders         Ordered    ondansetron (ZOFRAN ODT) 4 MG disintegrating tablet  Every 8 hours PRN        12/26/20 2209            Loni Beckwith, PA-C 12/27/20 [place], [place], DO 12/27/20 1501

## 2020-12-26 NOTE — ED Notes (Signed)
Pt refusing nitro at this time due to getting a headache from the first dose with EMS.

## 2020-12-26 NOTE — ED Triage Notes (Signed)
Pt BIB EMS from home. Pt reports intermittent chest pain x3 days that became worse today. Pt has hx of afib, but not currently in afib per EMS. Pt denies SHOB and nausea. Pt also reports abdominal pain and N/V/D x1 week.   324 aspirin 0.4 nitro  18G LAC  160/90 HR 80s 98% RA

## 2020-12-26 NOTE — Discharge Instructions (Signed)
You came to the emergency department today to be evaluated for your chest pain, nausea and diarrhea.  Your physical exam was reassuring.  Your EKG and lab work were reassuring that you are not having an acute heart attack today.  It is still very important that you follow-up with your primary care provider.  Your nausea and diarrhea are likely due to your IBS.  Please follow-up with your gastroenterologist.  I have given you prescription for Zofran.  You may take 1 pill every 8 hours as needed for nausea.  Get help right away if: Your chest pain gets worse. You have a cough that gets worse, or you cough up blood. You have severe pain in your abdomen. You faint. You have sudden, unexplained chest discomfort. You have sudden, unexplained discomfort in your arms, back, neck, or jaw. You have shortness of breath at any time. You suddenly start to sweat, or your skin gets clammy. You feel nausea or you vomit. You suddenly feel lightheaded or dizzy. You have severe weakness, or unexplained weakness or fatigue. Your heart begins to beat quickly, or it feels like it is skipping beats.

## 2021-03-24 ENCOUNTER — Other Ambulatory Visit: Payer: Self-pay | Admitting: Obstetrics and Gynecology

## 2021-06-28 ENCOUNTER — Emergency Department (HOSPITAL_COMMUNITY)
Admission: EM | Admit: 2021-06-28 | Discharge: 2021-06-28 | Disposition: A | Discharge status: Home / Self Care | Payer: Medicaid Other | Attending: Emergency Medicine | Admitting: Emergency Medicine

## 2021-06-28 ENCOUNTER — Encounter (HOSPITAL_COMMUNITY): Payer: Self-pay | Admitting: Emergency Medicine

## 2021-06-28 ENCOUNTER — Emergency Department (HOSPITAL_COMMUNITY): Payer: Medicaid Other

## 2021-06-28 DIAGNOSIS — Z20822 Contact with and (suspected) exposure to covid-19: Secondary | ICD-10-CM | POA: Insufficient documentation

## 2021-06-28 DIAGNOSIS — F17210 Nicotine dependence, cigarettes, uncomplicated: Secondary | ICD-10-CM | POA: Diagnosis not present

## 2021-06-28 DIAGNOSIS — J101 Influenza due to other identified influenza virus with other respiratory manifestations: Secondary | ICD-10-CM | POA: Insufficient documentation

## 2021-06-28 DIAGNOSIS — J111 Influenza due to unidentified influenza virus with other respiratory manifestations: Secondary | ICD-10-CM

## 2021-06-28 DIAGNOSIS — R0602 Shortness of breath: Secondary | ICD-10-CM | POA: Diagnosis present

## 2021-06-28 DIAGNOSIS — M79652 Pain in left thigh: Secondary | ICD-10-CM | POA: Insufficient documentation

## 2021-06-28 DIAGNOSIS — N9489 Other specified conditions associated with female genital organs and menstrual cycle: Secondary | ICD-10-CM | POA: Diagnosis not present

## 2021-06-28 DIAGNOSIS — I10 Essential (primary) hypertension: Secondary | ICD-10-CM | POA: Diagnosis not present

## 2021-06-28 DIAGNOSIS — Z789 Other specified health status: Secondary | ICD-10-CM

## 2021-06-28 LAB — I-STAT CHEM 8, ED
BUN: 8 mg/dL (ref 6–20)
Calcium, Ion: 1.13 mmol/L — ABNORMAL LOW (ref 1.15–1.40)
Chloride: 106 mmol/L (ref 98–111)
Creatinine, Ser: 0.6 mg/dL (ref 0.44–1.00)
Glucose, Bld: 95 mg/dL (ref 70–99)
HCT: 36 % (ref 36.0–46.0)
Hemoglobin: 12.2 g/dL (ref 12.0–15.0)
Potassium: 4.2 mmol/L (ref 3.5–5.1)
Sodium: 140 mmol/L (ref 135–145)
TCO2: 25 mmol/L (ref 22–32)

## 2021-06-28 LAB — CBC
HCT: 35.5 % — ABNORMAL LOW (ref 36.0–46.0)
Hemoglobin: 10.4 g/dL — ABNORMAL LOW (ref 12.0–15.0)
MCH: 17.6 pg — ABNORMAL LOW (ref 26.0–34.0)
MCHC: 29.3 g/dL — ABNORMAL LOW (ref 30.0–36.0)
MCV: 60.2 fL — ABNORMAL LOW (ref 80.0–100.0)
Platelets: 266 10*3/uL (ref 150–400)
RBC: 5.9 MIL/uL — ABNORMAL HIGH (ref 3.87–5.11)
RDW: 19 % — ABNORMAL HIGH (ref 11.5–15.5)
WBC: 14.8 10*3/uL — ABNORMAL HIGH (ref 4.0–10.5)
nRBC: 0 % (ref 0.0–0.2)

## 2021-06-28 LAB — RESP PANEL BY RT-PCR (FLU A&B, COVID) ARPGX2
Influenza A by PCR: POSITIVE — AB
Influenza B by PCR: NEGATIVE
SARS Coronavirus 2 by RT PCR: NEGATIVE

## 2021-06-28 LAB — PREGNANCY, URINE: Preg Test, Ur: NEGATIVE

## 2021-06-28 MED ORDER — ALBUTEROL SULFATE HFA 108 (90 BASE) MCG/ACT IN AERS: 2.0000 | INHALATION_SPRAY | Freq: Four times a day (QID) | RESPIRATORY_TRACT | 0 refills | Status: DC | PRN

## 2021-06-28 MED ORDER — IOHEXOL 350 MG/ML SOLN
100.0000 mL | Freq: Once | INTRAVENOUS | Status: AC | PRN
  Administered 2021-06-28: 100 mL via INTRAVENOUS

## 2021-06-28 MED ORDER — IPRATROPIUM-ALBUTEROL 0.5-2.5 (3) MG/3ML IN SOLN
3.0000 mL | Freq: Once | RESPIRATORY_TRACT | Status: AC
  Administered 2021-06-28: 3 mL via RESPIRATORY_TRACT
  Filled 2021-06-28: qty 3

## 2021-06-28 MED ORDER — ACETAMINOPHEN 500 MG PO TABS
1000.0000 mg | ORAL_TABLET | Freq: Once | ORAL | Status: AC
  Administered 2021-06-28: 1000 mg via ORAL
  Filled 2021-06-28: qty 2

## 2021-06-28 NOTE — ED Triage Notes (Signed)
Pt. Stated, My asthma started this morning and my inhaler is not working. I also have bilateral leg pain and more on the left leg.

## 2021-06-28 NOTE — Discharge Instructions (Addendum)
You were seen here today diagnosed with the flu.  For symptom control, you can take over-the-counter cough and cold medication.  Do not take Tylenol or Profen for taking over-the-counter cough and cold medication as these medications already contain Tylenol ibuprofen and may cause toxic levels.  Additionally, you have been prescribed an albuterol inhaler to take as needed for symptoms.  Additional information on the flu included in this report.  Please follow-up with your PCP for further management of your hypertension and asthma.  If you have any concern, new or worsening symptoms, please return to the nearest emergency department.

## 2021-06-28 NOTE — ED Provider Notes (Signed)
Emergency Medicine Provider Triage Evaluation Note  Julie King , a 30 y.o. female  was evaluated in triage.  Pt complains of left leg pain, cough, shortness of breath, and hemoptysis.  Review of Systems  Positive: Shortness of breath, leg pain, cough, hemoptysis Negative: Fever, chest pain  Physical Exam  BP (!) 145/117 (BP Location: Right Arm)   Pulse (!) 121   Temp 99.8 F (37.7 C) (Oral)   Resp (!) 26   LMP 06/25/2021   SpO2 100%  Gen:   Awake, appears uncomfortable Resp:  Normal effort.  Without wheezes MSK:   Moves extremities without difficulty  Other:    Medical Decision Making  Medically screening exam initiated at 10:30 AM.  Appropriate orders placed.  JENNIFR GAETA was informed that the remainder of the evaluation will be completed by another provider, this initial triage assessment does not replace that evaluation, and the importance of remaining in the ED until their evaluation is complete.     Evlyn Courier, PA-C 06/28/21 1031    [place], Alvin Critchley, DO 06/29/21 0730

## 2021-06-28 NOTE — ED Provider Notes (Signed)
[facility] EMERGENCY DEPARTMENT Provider Note   CSN: [id number] Arrival date & time: 06/28/21  1012     History Chief Complaint  Patient presents with   Cough   Asthma   Leg Pain    Julie King is a 30 y.o. female with history of asthma, hypertension, paroxysmal A. fib, QT prolongation, T wave inversion, presents to department with 3 days of lateral left thigh pain intermittent.  She reports the pain comes from her left buttock and goes around to her left lateral thigh.  She denies any swelling of the leg or upper thigh.  Denies any trauma, falls, or MVC.  Today, she woke up with a dry cough, shortness of breath, and sore throat with cough.  Patient reports she also "taste blood" whenever she coughs, but does not cough any blood up.  She denies any fever, rhinorrhea, nasal congestion, abdominal pain, nausea, vomiting, diarrhea.  She denies any chest pain or palpitations.  She denies any long travel or exogenous hormone use.  No recent surgeries.  Daily smoker.  Allergic to Percocet.   Cough Associated symptoms: myalgias, shortness of breath and sore throat   Associated symptoms: no chest pain, no chills, no ear pain, no fever, no rash and no rhinorrhea   Asthma Associated symptoms include shortness of breath. Pertinent negatives include no chest pain and no abdominal pain.  Leg Pain Associated symptoms: no back pain and no fever       Past Medical History:  Diagnosis Date   Atrial fibrillation ([place])    Family history of breast cancer    Headache 07/25/2017   Hypertension    Irritable bowel syndrome    Morbidly obese ([place]) 07/25/2017   QT prolongation    T wave inversion in EKG     Patient Active Problem List   Diagnosis Date Noted   Family history of breast cancer    Essential hypertension 03/16/2020   History of atrial fibrillation 11/28/2019   Headache 07/25/2017   Morbidly obese ([place]) 07/25/2017    Past Surgical History:  Procedure  Laterality Date   CESAREAN SECTION     CESAREAN SECTION N/A 01/31/2020   Procedure: CESAREAN SECTION;  Surgeon: Chancy Milroy, MD;  Location: [facility] LD ORS;  Service: Obstetrics;  Laterality: N/A;   CHOLECYSTECTOMY     HERNIA REPAIR     TONSILLECTOMY       OB History     Gravida  2   Para  2   Term  1   Preterm  1   AB      Living  2      SAB      IAB      Ectopic      Multiple  0   Live Births  2           Family History  Problem Relation Age of Onset   Hypertension Other    Diabetes Other    Crohn's disease Mother    Hypertension Mother    Thyroid disease Father    Diabetes Father    Breast cancer Maternal Aunt 33   Breast cancer Paternal Grandmother        dx under 37   Breast cancer Other 70       MGMs sister, currently has stage IV    Social History   Tobacco Use   Smoking status: Every Day    Packs/day: 0.50    Years: 10.00  Pack years: 5.00    Types: Cigarettes   Smokeless tobacco: Never   Tobacco comments:    wants to quit but states very difficult Jan 2020  Vaping Use   Vaping Use: Never used  Substance Use Topics   Alcohol use: Yes   Drug use: No    Home Medications Prior to Admission medications   Medication Sig Start Date End Date Taking? Authorizing Provider  albuterol (VENTOLIN HFA) 108 (90 Base) MCG/ACT inhaler Inhale 2 puffs into the lungs every 6 (six) hours as needed for wheezing or shortness of breath. 06/28/21   Sherrell Puller, PA-C  ondansetron (ZOFRAN ODT) 4 MG disintegrating tablet Take 1 tablet (4 mg total) by mouth every 8 (eight) hours as needed for nausea or vomiting. 12/26/20   Loni Beckwith, PA-C    Allergies    Percocet [oxycodone-acetaminophen] and Pineapple  Review of Systems   Review of Systems  Constitutional:  Negative for chills and fever.  HENT:  Positive for sore throat. Negative for congestion, ear pain and rhinorrhea.   Eyes:  Negative for pain and visual disturbance.  Respiratory:   Positive for cough and shortness of breath.   Cardiovascular:  Negative for chest pain and palpitations.  Gastrointestinal:  Negative for abdominal pain and vomiting.  Genitourinary:  Negative for dysuria and hematuria.  Musculoskeletal:  Positive for myalgias. Negative for arthralgias and back pain.  Skin:  Negative for color change and rash.  Neurological:  Negative for seizures and syncope.  All other systems reviewed and are negative.  Physical Exam Updated Vital Signs BP (!) 167/95 (BP Location: Left Arm)   Pulse (!) 110   Temp (!) 100.6 F (38.1 C) (Oral)   Resp 20   LMP 06/25/2021   SpO2 99%   Physical Exam Constitutional:      Appearance: Normal appearance.  HENT:     Head: Normocephalic and atraumatic.     Right Ear: Tympanic membrane, ear canal and external ear normal.     Left Ear: Tympanic membrane, ear canal and external ear normal.     Nose: Congestion present.     Mouth/Throat:     Mouth: Mucous membranes are moist.     Pharynx: No oropharyngeal exudate or posterior oropharyngeal erythema.     Comments: Airway patent.  Uvula midline.  No pharyngeal erythema, edema, or exudate. Eyes:     General: No scleral icterus.       Right eye: No discharge.        Left eye: No discharge.  Cardiovascular:     Rate and Rhythm: Regular rhythm. Tachycardia present.     Heart sounds: No murmur heard. Pulmonary:     Effort: Pulmonary effort is normal. No respiratory distress.     Breath sounds: Wheezing present.     Comments: Bilateral expiratory wheezing in bases.  No respiratory distress, accessory muscle use, tripoding, nasal flaring, or cyanosis present.  Patient speaking in full sentences. Abdominal:     General: Abdomen is flat. Bowel sounds are normal.     Palpations: Abdomen is soft.  Musculoskeletal:        General: No deformity.     Cervical back: Normal range of motion.     Right lower leg: No edema.     Left lower leg: No edema.     Comments: Patient has  mild tenderness to left lateral thigh/gluteal.  No increased warmth, overlying skin changes, erythema, edema, ecchymosis, or rash noted to the area.  Good  DP and PT pulses.  Sensation intact.  Patient ambulatory.  Skin:    General: Skin is warm and dry.  Neurological:     General: No focal deficit present.     Mental Status: She is alert. Mental status is at baseline.    ED Results / Procedures / Treatments   Labs (all labs ordered are listed, but only abnormal results are displayed) Labs Reviewed  RESP PANEL BY RT-PCR (FLU A&B, COVID) ARPGX2 - Abnormal; Notable for the following components:      Result Value   Influenza A by PCR POSITIVE (*)    All other components within normal limits  CBC - Abnormal; Notable for the following components:   WBC 14.8 (*)    RBC 5.90 (*)    Hemoglobin 10.4 (*)    HCT 35.5 (*)    MCV 60.2 (*)    MCH 17.6 (*)    MCHC 29.3 (*)    RDW 19.0 (*)    All other components within normal limits  I-STAT CHEM 8, ED - Abnormal; Notable for the following components:   Calcium, Ion 1.13 (*)    All other components within normal limits  PREGNANCY, URINE    EKG None  Radiology CT Angio Chest Pulmonary Embolism (PE) W or WO Contrast  Result Date: 06/28/2021 CLINICAL DATA:  Pulmonary emboli suspected, high probability. EXAM: CT ANGIOGRAPHY CHEST WITH CONTRAST TECHNIQUE: Multidetector CT imaging of the chest was performed using the standard protocol during bolus administration of intravenous contrast. Multiplanar CT image reconstructions and MIPs were obtained to evaluate the vascular anatomy. CONTRAST:  156mL OMNIPAQUE IOHEXOL 350 MG/ML SOLN COMPARISON:  Chest radiography 12/26/2020 FINDINGS: Cardiovascular: Heart size is normal. No pericardial effusion. No visible coronary artery calcification or thoracic aortic calcification. Pulmonary artery opacification is only moderate. No pulmonary emboli are seen, but small peripheral emboli might be inapparent.  Mediastinum/Nodes: No mass or adenopathy. Lungs/Pleura: Mild hazy pulmonary opacity seen in both lungs in a patchy fashion that could possibly represent early hazy pneumonia. No dense consolidation or lobar collapse. No effusion. Upper Abdomen: Negative Musculoskeletal: Normal Review of the MIP images confirms the above findings. IMPRESSION: Pulmonary arterial opacification is only moderate. I think this study excludes large central emboli. Small peripheral emboli could be inapparent. Areas of hazy pulmonary opacity in both lungs without dense consolidation. Question early/mild pneumonia. Electronically Signed   By: Nelson Chimes M.D.   On: 06/28/2021 12:58   CT US GUIDE VASC ACCESS RT NO REPORT  Result Date: 06/28/2021 There is no Radiologist interpretation  for this exam.   Procedures Procedures   Medications Ordered in ED Medications  ipratropium-albuterol (DUONEB) 0.5-2.5 (3) MG/3ML nebulizer solution 3 mL (3 mLs Nebulization Given 06/28/21 1039)  iohexol (OMNIPAQUE) 350 MG/ML injection 100 mL (100 mLs Intravenous Contrast Given 06/28/21 1222)  ipratropium-albuterol (DUONEB) 0.5-2.5 (3) MG/3ML nebulizer solution 3 mL (3 mLs Nebulization Given 06/28/21 1448)  acetaminophen (TYLENOL) tablet 1,000 mg (1,000 mg Oral Given 06/28/21 1447)    ED Course  I have reviewed the triage vital signs and the nursing notes.  Pertinent labs & imaging results that were available during my care of the patient were reviewed by me and considered in my medical decision making (see chart for details).  Patient presents with 3 days of left leg pain and 1 day of shortness of breath, cough, and sore throat.  Patient does note that he is a coagulation for paroxysmal A. fib due to CHA2DS2-VASc and her being asymptomatic  since giving birth.  Parental diagnosis includes but not limited to pneumonia, PE, pneumothorax, flu, COVID, viral illness.   I personally reviewed and interpreted the patient's labs and imaging.   Labs show leukocytosis of 14.8 mildly elevated from previous of 13.5.  Additionally, CBC shows anemia, but consistent with patient's baseline.  Urine pregnancy negative.  Chem-8 i-STAT shows no electrolyte abnormality.  Respiratory panel positive for flu A, negative for COVID.  CTA chest shows pulmonary arterial opacification is only moderate. I think this study excludes large central emboli. Small peripheral emboli could be inapparent. Areas of hazy pulmonary opacity in both lungs without dense consolidation. Question early/mild pneumonia.  Discussed these findings with my attending who advised that this is likely just due to the flu, and she is less likely to have a PE.  No other intervention needed.  EKG shows sinus tachycardia.  Vital signs stable, patient oxygenating well on room air.  Based on patient's wheezing, DuoNeb ordered.  On reevaluation, patient reports she feels like she is breathing better.  Lungs are now clear to auscultation.  Patient is moving air with ease.  Patient was spiking low-grade temperature, so ordered Tylenol 1000 mg.  I discussed the lab and imaging findings with patient.  Discussed the risk and benefits of Tamiflu with patient.  Patient declines at this time.  Recommended supportive care with over-the-counter cough and cold medication.  Advised against taking this medication with over-the-counter Tylenol or ibuprofen as to avoid an overdose.  Advised to stay well-hydrated with fluids, mainly water.  Advised to keep throat well lubricated with lozenges or cough drops.  Work note given.  Informed her to stay at home as the flu is contagious.  Patient was finding relief with her girlfriends albuterol inhaler, and given patient's improvement in the emergency department with the [facility], will send home with ProAir.  Return precautions given.  Patient agrees with plan.  Patient is stable and being discharged home in good condition.   I discussed this case with my attending physician  who cosigned this note including patient's presenting symptoms, physical exam, and planned diagnostics and interventions. Attending physician stated agreement with plan or made changes to plan which were implemented.    MDM Rules/Calculators/A&P                          Final Clinical Impression(s) / ED Diagnoses Final diagnoses:  Flu    Rx / DC Orders ED Discharge Orders          Ordered    albuterol (VENTOLIN HFA) 108 (90 Base) MCG/ACT inhaler  Every 6 hours PRN        06/28/21 1519             Sherrell Puller, PA-C 06/29/21 1743    Truddie Hidden, MD 06/30/21 0700

## 2021-07-20 ENCOUNTER — Ambulatory Visit
Admission: EM | Admit: 2021-07-20 | Discharge: 2021-07-20 | Disposition: A | Discharge status: Home / Self Care | Payer: Medicaid Other | Attending: Emergency Medicine | Admitting: Emergency Medicine

## 2021-07-20 ENCOUNTER — Other Ambulatory Visit: Payer: Self-pay

## 2021-07-20 DIAGNOSIS — K029 Dental caries, unspecified: Secondary | ICD-10-CM

## 2021-07-20 DIAGNOSIS — K0889 Other specified disorders of teeth and supporting structures: Secondary | ICD-10-CM | POA: Diagnosis not present

## 2021-07-20 MED ORDER — AMOXICILLIN-POT CLAVULANATE 875-125 MG PO TABS: 1.0000 | ORAL_TABLET | Freq: Two times a day (BID) | ORAL | 0 refills | Status: DC

## 2021-07-20 MED ORDER — PREDNISONE 10 MG (21) PO TBPK: ORAL_TABLET | Freq: Every day | ORAL | 0 refills | Status: DC

## 2021-07-20 NOTE — ED Triage Notes (Signed)
Attempt to call from lobby. 

## 2021-07-20 NOTE — ED Triage Notes (Signed)
Pt reports having a tooth infection, she reports pain to left jaw.

## 2021-07-20 NOTE — Discharge Instructions (Addendum)
You will need to be seen at a dentist  Will start abx for infection  Take meds with food

## 2021-09-26 ENCOUNTER — Other Ambulatory Visit: Payer: Self-pay

## 2021-09-26 ENCOUNTER — Emergency Department (HOSPITAL_COMMUNITY)
Admission: EM | Admit: 2021-09-26 | Discharge: 2021-09-26 | Disposition: A | Discharge status: Home / Self Care | Payer: Medicaid Other | Attending: Emergency Medicine | Admitting: Emergency Medicine

## 2021-09-26 ENCOUNTER — Emergency Department (HOSPITAL_COMMUNITY): Payer: Medicaid Other

## 2021-09-26 ENCOUNTER — Encounter (HOSPITAL_COMMUNITY): Payer: Self-pay | Admitting: Emergency Medicine

## 2021-09-26 DIAGNOSIS — Z7951 Long term (current) use of inhaled steroids: Secondary | ICD-10-CM | POA: Diagnosis not present

## 2021-09-26 DIAGNOSIS — R002 Palpitations: Secondary | ICD-10-CM | POA: Diagnosis present

## 2021-09-26 DIAGNOSIS — I480 Paroxysmal atrial fibrillation: Secondary | ICD-10-CM | POA: Insufficient documentation

## 2021-09-26 LAB — CBC
HCT: 37.3 % (ref 36.0–46.0)
Hemoglobin: 11.2 g/dL — ABNORMAL LOW (ref 12.0–15.0)
MCH: 17.9 pg — ABNORMAL LOW (ref 26.0–34.0)
MCHC: 30 g/dL (ref 30.0–36.0)
MCV: 59.6 fL — ABNORMAL LOW (ref 80.0–100.0)
Platelets: 265 10*3/uL (ref 150–400)
RBC: 6.26 MIL/uL — ABNORMAL HIGH (ref 3.87–5.11)
RDW: 19.5 % — ABNORMAL HIGH (ref 11.5–15.5)
WBC: 13.4 10*3/uL — ABNORMAL HIGH (ref 4.0–10.5)
nRBC: 0 % (ref 0.0–0.2)

## 2021-09-26 LAB — BASIC METABOLIC PANEL
Anion gap: 6 (ref 5–15)
BUN: 9 mg/dL (ref 6–20)
CO2: 25 mmol/L (ref 22–32)
Calcium: 8.9 mg/dL (ref 8.9–10.3)
Chloride: 108 mmol/L (ref 98–111)
Creatinine, Ser: 0.81 mg/dL (ref 0.44–1.00)
GFR, Estimated: >60 mL/min (ref >60)
Glucose, Bld: 94 mg/dL (ref 70–99)
Potassium: 3.9 mmol/L (ref 3.5–5.1)
Sodium: 139 mmol/L (ref 135–145)

## 2021-09-26 LAB — I-STAT BETA HCG BLOOD, ED (MC, WL, AP ONLY): I-stat hCG, quantitative: <5 m[IU]/mL (ref <5)

## 2021-09-26 NOTE — ED Provider Notes (Signed)
[facility] EMERGENCY DEPARTMENT Provider Note   CSN: [id number] Arrival date & time: 09/26/21  1658     History  Chief Complaint  Patient presents with   Atrial Fibrillation    Julie King is a 31 y.o. female.  Presents to ER with concern for palpitations.  Patient reports over the last few days she has been having occasional episodes of palpitations, feels somewhat short of breath during these episodes and a little bit lightheaded.  No episodes of passing out.  Episodes last for few seconds to a couple minutes in length.  Up to a few episodes per day.  Not associated with exertion.  Reports that she has a prior history of atrial fibrillation, has not been in A. fib for a long time.  Is not on any medication for this at this time.  Has not seen her cardiologist in a while.  Additional history obtained from chart review, review of care everywhere, 2020 saw [facility] cardiologist, they documented history of paroxysmal A. fib but per their note, patient had a 30-day monitor which did not show any atrial fibrillation.  Had been seen at [facility] for palpitations and was in sinus rhythm.    HPI     Home Medications Prior to Admission medications   Medication Sig Start Date End Date Taking? Authorizing Provider  albuterol (VENTOLIN HFA) 108 (90 Base) MCG/ACT inhaler Inhale 2 puffs into the lungs every 6 (six) hours as needed for wheezing or shortness of breath. 06/28/21   Sherrell Puller, PA-C  amoxicillin-clavulanate (AUGMENTIN) 875-125 MG tablet Take 1 tablet by mouth every 12 (twelve) hours. 07/20/21   Marney Setting, NP  ondansetron (ZOFRAN ODT) 4 MG disintegrating tablet Take 1 tablet (4 mg total) by mouth every 8 (eight) hours as needed for nausea or vomiting. 12/26/20   Loni Beckwith, PA-C  predniSONE (STERAPRED UNI-PAK 21 TAB) 10 MG (21) TBPK tablet Take by mouth daily. Take 6 tabs by mouth daily  for 2 days, then 5 tabs for 2 days, then 4 tabs for 2  days, then 3 tabs for 2 days, 2 tabs for 2 days, then 1 tab by mouth daily for 2 days 07/20/21   Marney Setting, NP      Allergies    Percocet [oxycodone-acetaminophen] and Pineapple    Review of Systems   Review of Systems  Cardiovascular:  Positive for palpitations.  All other systems reviewed and are negative.  Physical Exam Updated Vital Signs BP 134/68 (BP Location: Right Arm)    Pulse 89    Temp 97.9 F (36.6 C) (Oral)    Resp 18    LMP 08/26/2021    SpO2 98%  Physical Exam Vitals and nursing note reviewed.  Constitutional:      General: She is not in acute distress.    Appearance: She is well-developed.  HENT:     Head: Normocephalic and atraumatic.  Eyes:     Conjunctiva/sclera: Conjunctivae normal.  Cardiovascular:     Rate and Rhythm: Normal rate and regular rhythm.     Heart sounds: No murmur heard. Pulmonary:     Effort: Pulmonary effort is normal. No respiratory distress.     Breath sounds: Normal breath sounds.  Abdominal:     Palpations: Abdomen is soft.     Tenderness: There is no abdominal tenderness.  Musculoskeletal:        General: No swelling.     Cervical back: Neck supple.  Skin:  General: Skin is warm and dry.     Capillary Refill: Capillary refill takes less than 2 seconds.  Neurological:     Mental Status: She is alert.  Psychiatric:        Mood and Affect: Mood normal.    ED Results / Procedures / Treatments   Labs (all labs ordered are listed, but only abnormal results are displayed) Labs Reviewed  CBC - Abnormal; Notable for the following components:      Result Value   WBC 13.4 (*)    RBC 6.26 (*)    Hemoglobin 11.2 (*)    MCV 59.6 (*)    MCH 17.9 (*)    RDW 19.5 (*)    All other components within normal limits  BASIC METABOLIC PANEL  I-STAT BETA HCG BLOOD, ED ([facility], [facility], [facility] ONLY)    EKG EKG Interpretation  Date/Time:  Sunday September 26 2021 17:46:59 EST Ventricular Rate:  85 PR Interval:  128 QRS Duration: 97 QT  Interval:  348 QTC Calculation: 414 R Axis:   68 Text Interpretation: Sinus rhythm Borderline repolarization abnormality Confirmed by Madalyn Rob [phone]) on 09/26/2021 7:23:29 PM  Radiology DG Chest 2 View  Result Date: 09/26/2021 CLINICAL DATA:  Atrial fibrillation. EXAM: CHEST - 2 VIEW COMPARISON:  December 26, 2020. FINDINGS: The heart size and mediastinal contours are within normal limits. Both lungs are clear. The visualized skeletal structures are unremarkable. IMPRESSION: No active cardiopulmonary disease. Electronically Signed   By: Marijo Conception M.D.   On: 09/26/2021 17:53    Procedures Procedures    Medications Ordered in ED Medications - No data to display  ED Course/ Medical Decision Making/ A&P       CHA2DS2-VASc Score: 1                    Medical Decision Making Amount and/or Complexity of Data Reviewed Labs: ordered. Radiology: ordered.   31 year old lady presents to ER with concern for palpitations.  She reports prior history of atrial fibrillation, per review of chart, does not appear to have been in any A. fib over the last few years.  She is not currently on any medications.  Patient is in sinus rhythm right now.  EKG is unremarkable.  Cardiac monitor here reviewed and demonstrates only sinus rhythm.  Basic labs are within normal limits, no new anemia or electrolyte derangement.  Patient has no ongoing symptoms at present.  Recommend she establish care with local cardiologist as she now resides in [place].  Provided information for cardiology.  Reviewed return precautions and discharged home.  As patient has low CHA2DS2-VASc score and is not currently in A. fib and no recent documented A. fib, do not feel any anticoagulation is indicated at this time.  Would defer to cardiology on an outpatient basis.        Final Clinical Impression(s) / ED Diagnoses Final diagnoses:  Palpitations    Rx / DC Orders ED Discharge Orders     None          Lucrezia Starch, MD 09/27/21 1313

## 2021-09-26 NOTE — Discharge Instructions (Signed)
Please follow-up with your cardiologist.  If you do not have a local cardiologist, you can follow-up with the cardiologist on-call today.  If you have worsening palpitations, any chest pain or difficulty breathing, come back to ER for reassessment.

## 2021-09-26 NOTE — ED Triage Notes (Signed)
Pt reports history of A fib.  C/o intermittent palpitations, SOB, and dizziness x 4 days.  Denies pain.

## 2021-11-01 ENCOUNTER — Ambulatory Visit (INDEPENDENT_AMBULATORY_CARE_PROVIDER_SITE_OTHER)
Admit: 2021-11-01 | Discharge: 2021-11-01 | Disposition: A | Discharge status: Home / Self Care | Payer: Medicaid Other | Attending: Internal Medicine | Admitting: Internal Medicine

## 2021-11-01 ENCOUNTER — Ambulatory Visit
Admission: EM | Admit: 2021-11-01 | Discharge: 2021-11-01 | Disposition: A | Discharge status: Home / Self Care | Payer: Medicaid Other | Attending: Emergency Medicine | Admitting: Emergency Medicine

## 2021-11-01 ENCOUNTER — Encounter: Payer: Self-pay | Admitting: Emergency Medicine

## 2021-11-01 ENCOUNTER — Other Ambulatory Visit: Payer: Self-pay

## 2021-11-01 DIAGNOSIS — J9801 Acute bronchospasm: Secondary | ICD-10-CM

## 2021-11-01 DIAGNOSIS — J209 Acute bronchitis, unspecified: Secondary | ICD-10-CM

## 2021-11-01 DIAGNOSIS — J329 Chronic sinusitis, unspecified: Secondary | ICD-10-CM

## 2021-11-01 DIAGNOSIS — J22 Unspecified acute lower respiratory infection: Secondary | ICD-10-CM

## 2021-11-01 DIAGNOSIS — J310 Chronic rhinitis: Secondary | ICD-10-CM

## 2021-11-01 DIAGNOSIS — J3089 Other allergic rhinitis: Secondary | ICD-10-CM

## 2021-11-01 DIAGNOSIS — R069 Unspecified abnormalities of breathing: Secondary | ICD-10-CM

## 2021-11-01 DIAGNOSIS — J302 Other seasonal allergic rhinitis: Secondary | ICD-10-CM

## 2021-11-01 MED ORDER — GUAIFENESIN 400 MG PO TABS
ORAL_TABLET | ORAL | 0 refills | Status: DC
Start: 2021-11-01 — End: 2022-06-14

## 2021-11-01 MED ORDER — FLUTICASONE PROPIONATE 50 MCG/ACT NA SUSP: 2.0000 | Freq: Every day | NASAL | 0 refills | Status: DC

## 2021-11-01 MED ORDER — AZITHROMYCIN 250 MG PO TABS: ORAL_TABLET | ORAL | 0 refills | Status: AC

## 2021-11-01 MED ORDER — IPRATROPIUM BROMIDE 0.06 % NA SOLN
2.0000 | Freq: Four times a day (QID) | NASAL | 0 refills | Status: DC
Start: 2021-11-01 — End: 2022-06-14

## 2021-11-01 MED ORDER — CETIRIZINE HCL 10 MG PO TABS: 10.0000 mg | ORAL_TABLET | Freq: Every day | ORAL | 5 refills | Status: DC

## 2021-11-01 MED ORDER — METHYLPREDNISOLONE 4 MG PO TBPK: ORAL_TABLET | ORAL | 0 refills | Status: DC

## 2021-11-01 MED ORDER — AEROCHAMBER PLUS FLO-VU LARGE MISC: 1.0000 | Freq: Once | 0 refills | Status: AC

## 2021-11-01 MED ORDER — ALBUTEROL SULFATE HFA 108 (90 BASE) MCG/ACT IN AERS: 2.0000 | INHALATION_SPRAY | Freq: Four times a day (QID) | RESPIRATORY_TRACT | 0 refills | Status: DC | PRN

## 2021-11-01 NOTE — Discharge Instructions (Addendum)
Your chest x-ray was not concerning for active pneumonia however based on my physical exam findings still concerned that this could develop in your right upper lobe because your breath sounds are so quiet in that area. ? ?When you cough, you also have significant bronchospasm which means your airway clamps down tight making your coughing very inefficient.  This means that when you cough, you are not moving sputum up and out as well as you should be.  I recommend that you begin a steroid to calm your airways as well as some albuterol to relax the smooth muscle around your airways for dual action opening of your airways.  This should help your cough and be more efficient and significantly decrease your chances of developing pneumonia. ? ?Please see the list below for recommended medications, dosages and frequencies to provide relief of your current symptoms:   ?  ?Methylprednisolone (Medrol Dosepak): This is a steroid that will significantly calm your upper and lower airways, please take one row of tablets daily with your breakfast meal starting tomorrow morning until the prescription is complete.    ?  ?Albuterol HFA: This is a bronchodilator, it relaxes the smooth muscles that constrict your airway in your lungs when you are feeling sick or having inflammation secondary to allergies or upper respiratory infection.  Please inhale 2 puffs twice daily every day using the spacer provided.  You can also inhale 2 more puffs as often as needed throughout the day for aggravating cough, chest tightness, feeling short of breath, wheezing.    ?  ?Fluticasone (Flonase): This is a steroid nasal spray that you use once daily, 1 spray in each nare.  After 3 to 5 days of use, you will have significant improvement of the inflammation and mucus production that is being caused by exposure to allergens.  This medication can be purchased over-the-counter however I have provided you with a prescription.    ?  ?Ipratropium (Atrovent):  This is an excellent nasal decongestant spray that does not cause rebound congestion, can be used up to 4 times daily as needed, instill 2 sprays into each nare with each use.  I have provided you with a prescription for this medication.    ?  ?Guaifenesin (Robitussin, Mucinex): This is an expectorant.  This helps break up chest congestion and loosen up thick nasal drainage making phlegm and drainage more liquid and therefore easier to remove.  I recommend being 400 mg three times daily as needed.    ? ?Cetirizine (Zyrtec): This is an excellent second-generation antihistamine that helps to reduce respiratory inflammatory response to viruses and environmental allergens.  Please take 1 tablet daily at bedtime.  I recommend that he stay on this medication through spring since you are a chronic allergy suffer.  If you can keep your allergies well controlled, you will definitely decrease your risk of repeat respiratory infections such as when you are having right now or worse. ? ?Finally, because we know there is bacteria in your lungs and I am about to put you on a steroid which will blunt to your immune system, I also recommend that you take short course of azithromycin just to clean up any bacteria that is lingering in the sputum in your lungs at this time.  This is the icing on the cake for pneumonia prevention.  Please take 2 tablets today and 1 tablet daily thereafter until complete. ? ?Thank you for visiting urgent care today.  As we discussed, I provided you  with a few notes to return to work depending on how you are feeling.  I hope you feel much better soon.  If you do not, please feel free to return for reevaluation. ?

## 2021-11-01 NOTE — ED Provider Notes (Signed)
[facility]    CSN: [id number] Arrival date & time: 11/01/21  0808    HISTORY   Chief Complaint  Patient presents with   Cough   Sore Throat   HPI Julie King is a 31 y.o. female. Pt reports last Tuesday started having headaches, cough, congestion, body aches, neck pain, sore throat, ear felt ringing and fluid filled. Home covid tests were all negative. Reports feeling little better now and needs a note to go back to work.  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.  Patient endorses a history of perennial allergies with mild seasonal variation, not currently taking allergy medications at this time.  Patient states has been taking DayQuil and NyQuil along with TheraFlu with some relief of her symptoms.  Patient states she has been on albuterol inhalers in the past but not currently using any at this time.  Patient has a mildly elevated temperature on arrival with normal oxygenation and slightly elevated heart rate.  Patient is also slightly hypertensive.  The history is provided by the patient.  Past Medical History:  Diagnosis Date   Atrial fibrillation ([place])    Family history of breast cancer    Headache 07/25/2017   Hypertension    Irritable bowel syndrome    Morbidly obese ([place]) 07/25/2017   QT prolongation    T wave inversion in EKG    Patient Active Problem List   Diagnosis Date Noted   Family history of breast cancer    Essential hypertension 03/16/2020   History of atrial fibrillation 11/28/2019   Headache 07/25/2017   Morbidly obese ([place]) 07/25/2017   Past Surgical History:  Procedure Laterality Date   CESAREAN SECTION     CESAREAN SECTION N/A 01/31/2020   Procedure: CESAREAN SECTION;  Surgeon: Chancy Milroy, MD;  Location: [facility] LD [facility]S;  Service: Obstetrics;  Laterality: N/A;   CHOLECYSTECTOMY     HERNIA REPAIR     TONSILLECTOMY     OB History     Gravida  2   Para  2   Term  1   Preterm  1   AB      Living  2       SAB      IAB      Ectopic      Multiple  0   Live Births  2          Home Medications    Prior to Admission medications   Medication Sig Start Date End Date Taking? Authorizing Provider  albuterol (VENTOLIN HFA) 108 (90 Base) MCG/ACT inhaler Inhale 2 puffs into the lungs every 6 (six) hours as needed for wheezing or shortness of breath. 06/28/21   Sherrell Puller, PA-C  amoxicillin-clavulanate (AUGMENTIN) 875-125 MG tablet Take 1 tablet by mouth every 12 (twelve) hours. 07/20/21   Marney Setting, NP  ondansetron (ZOFRAN ODT) 4 MG disintegrating tablet Take 1 tablet (4 mg total) by mouth every 8 (eight) hours as needed for nausea or vomiting. 12/26/20   Loni Beckwith, PA-C  predniSONE (STERAPRED UNI-PAK 21 TAB) 10 MG (21) TBPK tablet Take by mouth daily. Take 6 tabs by mouth daily  for 2 days, then 5 tabs for 2 days, then 4 tabs for 2 days, then 3 tabs for 2 days, 2 tabs for 2 days, then 1 tab by mouth daily for 2 days 07/20/21   Marney Setting, NP   Family History Family History  Problem Relation Age of Onset   Hypertension Other    Diabetes Other    Crohn's disease Mother    Hypertension Mother    Thyroid disease Father    Diabetes Father    Breast cancer Maternal Aunt 64   Breast cancer Paternal Grandmother        dx under 77   Breast cancer Other 24       MGMs sister, currently has stage IV   Social History Social History   Tobacco Use   Smoking status: Every Day    Packs/day: 0.50    Years: 10.00    Pack years: 5.00    Types: Cigarettes   Smokeless tobacco: Never   Tobacco comments:    wants to quit but states very difficult Jan 2020  Vaping Use   Vaping Use: Never used  Substance Use Topics   Alcohol use: Yes   Drug use: No   Allergies   Percocet [oxycodone-acetaminophen] and Pineapple  Review of Systems Review of Systems Pertinent findings noted in history of present illness.   Physical Exam Triage Vital Signs ED Triage  Vitals  Enc Vitals Group     BP 06/25/21 0827 (!) 147/82     Pulse Rate 06/25/21 0827 72     Resp 06/25/21 0827 18     Temp 06/25/21 0827 98.3 F (36.8 C)     Temp Source 06/25/21 0827 Oral     SpO2 06/25/21 0827 98 %     Weight --      Height --      Head Circumference --      Peak Flow --      Pain Score 06/25/21 0826 5     Pain Loc --      Pain Edu? --      Excl. in [place]? --   No data found.  Updated Vital Signs BP (!) 146/73 (BP Location: Right Arm)    Pulse 100    Temp 99 F (37.2 C) (Oral)    Resp 18    LMP 10/29/2021    SpO2 98%   Physical Exam Vitals and nursing note reviewed.  Constitutional:      General: She is not in acute distress.    Appearance: Normal appearance. She is not ill-appearing.  HENT:     Head: Normocephalic and atraumatic.     Salivary Glands: Right salivary gland is not diffusely enlarged or tender. Left salivary gland is not diffusely enlarged or tender.     Right Ear: Ear canal and external ear normal. No drainage. A middle ear effusion is present. There is no impacted cerumen. Tympanic membrane is bulging. Tympanic membrane is not injected or erythematous.     Left Ear: Ear canal and external ear normal. No drainage. A middle ear effusion is present. There is no impacted cerumen. Tympanic membrane is bulging. Tympanic membrane is not injected or erythematous.     Ears:     Comments: Bilateral EACs normal, both TMs bulging with clear fluid    Nose: Rhinorrhea present. No nasal deformity, septal deviation, signs of injury, nasal tenderness, mucosal edema or congestion. Rhinorrhea is clear.     Right Nostril: Occlusion present. No foreign body, epistaxis or septal hematoma.     Left Nostril: Occlusion present. No foreign body, epistaxis or septal hematoma.     Right Turbinates: Enlarged, swollen and pale.     Left Turbinates: Enlarged, swollen and pale.     Right Sinus: No maxillary  sinus tenderness or frontal sinus tenderness.     Left Sinus: No  maxillary sinus tenderness or frontal sinus tenderness.     Mouth/Throat:     Lips: Pink. No lesions.     Mouth: Mucous membranes are moist. No oral lesions.     Pharynx: Oropharynx is clear. Uvula midline. No posterior oropharyngeal erythema or uvula swelling.     Tonsils: No tonsillar exudate. 0 on the right. 0 on the left.     Comments: Postnasal drip Eyes:     General: Lids are normal.        Right eye: No discharge.        Left eye: No discharge.     Extraocular Movements: Extraocular movements intact.     Conjunctiva/sclera: Conjunctivae normal.     Right eye: Right conjunctiva is not injected.     Left eye: Left conjunctiva is not injected.  Neck:     Trachea: Trachea and phonation normal.  Cardiovascular:     Rate and Rhythm: Normal rate and regular rhythm.     Pulses: Normal pulses.     Heart sounds: Normal heart sounds. No murmur heard.   No friction rub. No gallop.  Pulmonary:     Effort: Pulmonary effort is normal. No accessory muscle usage, prolonged expiration or respiratory distress.     Breath sounds: No stridor, decreased air movement or transmitted upper airway sounds. Examination of the right-upper field reveals decreased breath sounds. Examination of the right-middle field reveals decreased breath sounds. Decreased breath sounds present. No wheezing, rhonchi or rales.  Chest:     Chest wall: No tenderness.  Musculoskeletal:        General: Normal range of motion.     Cervical back: Normal range of motion and neck supple. Normal range of motion.  Lymphadenopathy:     Cervical: No cervical adenopathy.  Skin:    General: Skin is warm and dry.     Findings: No erythema or rash.  Neurological:     General: No focal deficit present.     Mental Status: She is alert and oriented to person, place, and time.  Psychiatric:        Mood and Affect: Mood normal.        Behavior: Behavior normal.    Visual Acuity Right Eye Distance:   Left Eye Distance:   Bilateral  Distance:    Right Eye Near:   Left Eye Near:    Bilateral Near:     [facility] / Diagnostics / Procedures:    EKG  Radiology DG Chest 2 View  Result Date: 11/01/2021 CLINICAL DATA:  Decreased breath sounds on the right, lower respiratory infection EXAM: CHEST - 2 VIEW COMPARISON:  09/26/2021 FINDINGS: The heart size and mediastinal contours are within normal limits. Both lungs are clear. The visualized skeletal structures are unremarkable. IMPRESSION: No active cardiopulmonary disease. Electronically Signed   By: Davina Poke D.O.   On: 11/01/2021 09:29    Procedures Procedures (including critical care time)  UC Diagnoses / Final Clinical Impressions(s)   I have reviewed the triage vital signs and the nursing notes.  Pertinent labs & imaging results that were available during my care of the patient were reviewed by me and considered in my medical decision making (see chart for details).   Final diagnoses:  Acute lower respiratory infection  Acute bronchitis, unspecified organism  Bronchospasm  Bronchospasm with bronchitis, acute  Rhinosinusitis  Perennial allergic rhinitis with seasonal variation  Chest x-ray reassuring for no presence of pneumonia however cough is chesty and deep and am concerned that she may develop pneumonia in the next few days without aggressive treatment.  Recommend steroid Dosepak, allergy medications, expectoration and decongestants.  Also provided patient with a prescription for azithromycin for short course of pneumonia prevention.  Return precautions advised.  Note provided for work.  ED Prescriptions     Medication Sig Dispense Auth. Provider   azithromycin (ZITHROMAX) 250 MG tablet Take 2 tablets (500 mg total) by mouth daily for 1 day, THEN 1 tablet (250 mg total) daily for 4 days. 6 tablet Lynden Oxford Scales, PA-C   methylPREDNISolone (MEDROL DOSEPAK) 4 MG TBPK tablet Take 24 mg on day 1, 20 mg on day 2, 16 mg on day 3, 12 mg on day 4, 8 mg  on day 5, 4 mg on day 6. 21 tablet Lynden Oxford Scales, PA-C   guaifenesin (HUMIBID E) 400 MG TABS tablet Take 1 tablet 3 times daily as needed for chest congestion and cough 21 tablet Lynden Oxford Scales, PA-C   albuterol (VENTOLIN HFA) 108 (90 Base) MCG/ACT inhaler Inhale 2 puffs into the lungs every 6 (six) hours as needed for wheezing or shortness of breath (Cough). 18 g Lynden Oxford Scales, PA-C   fluticasone (FLONASE) 50 MCG/ACT nasal spray Place 2 sprays into both nostrils daily. 18 mL Lynden Oxford Scales, PA-C   ipratropium (ATROVENT) 0.06 % nasal spray Place 2 sprays into both nostrils 4 (four) times daily. As needed for nasal congestion, runny nose 15 mL Lynden Oxford Scales, PA-C   Spacer/Aero-Holding Chambers (AEROCHAMBER PLUS FLO-VU LARGE) MISC 1 each by Other route once for 1 dose. 1 each Lynden Oxford Scales, PA-C   cetirizine (ZYRTEC ALLERGY) 10 MG tablet Take 1 tablet (10 mg total) by mouth at bedtime. 30 tablet Lynden Oxford Scales, PA-C      PDMP not reviewed this encounter.  Pending results:  Labs Reviewed - No data to display  Medications Ordered in UC: Medications - No data to display  Disposition Upon Discharge:  Condition: stable for discharge home Home: take medications as prescribed; routine discharge instructions as discussed; follow up as advised.  Patient presented with an acute illness with associated systemic symptoms and significant discomfort requiring urgent management. In my opinion, this is a condition that a prudent lay person (someone who possesses an average knowledge of health and medicine) may potentially expect to result in complications if not addressed urgently such as respiratory distress, impairment of bodily function or dysfunction of bodily organs.   Routine symptom specific, illness specific and/or disease specific instructions were discussed with the patient and/or caregiver at length.   As such, the patient has been  evaluated and assessed, work-up was performed and treatment was provided in alignment with urgent care protocols and evidence based medicine.  Patient/parent/caregiver has been advised that the patient may require follow up for further testing and treatment if the symptoms continue in spite of treatment, as clinically indicated and appropriate.  If the patient was tested for COVID-19, Influenza and/or RSV, then the patient/parent/guardian was advised to isolate at home pending the results of his/her diagnostic coronavirus test and potentially longer if theyre positive. I have also advised pt that if his/her COVID-19 test returns positive, it's recommended to self-isolate for at least 10 days after symptoms first appeared AND until fever-free for 24 hours without fever reducer AND other symptoms have improved or resolved. Discussed self-isolation recommendations as well as instructions  for household member/close contacts as per the CDC and [place] DHHS, and also gave patient the [facility] packet with this information.  Patient/parent/caregiver has been advised to return to the [id number] or PCP in 3-5 days if no better; to PCP or the Emergency Department if new signs and symptoms develop, or if the current signs or symptoms continue to change or worsen for further workup, evaluation and treatment as clinically indicated and appropriate  The patient will follow up with their current PCP if and as advised. If the patient does not currently have a PCP we will assist them in obtaining one.   The patient may need specialty follow up if the symptoms continue, in spite of conservative treatment and management, for further workup, evaluation, consultation and treatment as clinically indicated and appropriate.  Patient/parent/caregiver verbalized understanding and agreement of plan as discussed.  All questions were addressed during visit.  Please see discharge instructions below for further details of plan.  Discharge  Instructions:   Discharge Instructions      Your chest x-ray was not concerning for active pneumonia however based on my physical exam findings still concerned that this could develop in your right upper lobe because your breath sounds are so quiet in that area.  When you cough, you also have significant bronchospasm which means your airway clamps down tight making your coughing very inefficient.  This means that when you cough, you are not moving sputum up and out as well as you should be.  I recommend that you begin a steroid to calm your airways as well as some albuterol to relax the smooth muscle around your airways for dual action opening of your airways.  This should help your cough and be more efficient and significantly decrease your chances of developing pneumonia.  Please see the list below for recommended medications, dosages and frequencies to provide relief of your current symptoms:     Methylprednisolone (Medrol Dosepak): This is a steroid that will significantly calm your upper and lower airways, please take one row of tablets daily with your breakfast meal starting tomorrow morning until the prescription is complete.      Albuterol HFA: This is a bronchodilator, it relaxes the smooth muscles that constrict your airway in your lungs when you are feeling sick or having inflammation secondary to allergies or upper respiratory infection.  Please inhale 2 puffs twice daily every day using the spacer provided.  You can also inhale 2 more puffs as often as needed throughout the day for aggravating cough, chest tightness, feeling short of breath, wheezing.      Fluticasone (Flonase): This is a steroid nasal spray that you use once daily, 1 spray in each nare.  After 3 to 5 days of use, you will have significant improvement of the inflammation and mucus production that is being caused by exposure to allergens.  This medication can be purchased over-the-counter however I have provided you with  a prescription.      Ipratropium (Atrovent): This is an excellent nasal decongestant spray that does not cause rebound congestion, can be used up to 4 times daily as needed, instill 2 sprays into each nare with each use.  I have provided you with a prescription for this medication.      Guaifenesin (Robitussin, Mucinex): This is an expectorant.  This helps break up chest congestion and loosen up thick nasal drainage making phlegm and drainage more liquid and therefore easier to remove.  I recommend being 400 mg three times  daily as needed.     Cetirizine (Zyrtec): This is an excellent second-generation antihistamine that helps to reduce respiratory inflammatory response to viruses and environmental allergens.  Please take 1 tablet daily at bedtime.  I recommend that he stay on this medication through spring since you are a chronic allergy suffer.  If you can keep your allergies well controlled, you will definitely decrease your risk of repeat respiratory infections such as when you are having right now or worse.  Finally, because we know there is bacteria in your lungs and I am about to put you on a steroid which will blunt to your immune system, I also recommend that you take short course of azithromycin just to clean up any bacteria that is lingering in the sputum in your lungs at this time.  This is the icing on the cake for pneumonia prevention.  Please take 2 tablets today and 1 tablet daily thereafter until complete.  Thank you for visiting urgent care today.  As we discussed, I provided you with a few notes to return to work depending on how you are feeling.  I hope you feel much better soon.  If you do not, please feel free to return for reevaluation.    This office note has been dictated using Museum/gallery curator.  Unfortunately, and despite my best efforts, this method of dictation can sometimes lead to occasional typographical or grammatical errors.  I apologize in advance if  this occurs.     Lynden Oxford Scales, [place] 11/01/21 [phone]

## 2021-11-01 NOTE — ED Triage Notes (Signed)
Pt reports last Tuesday started having headaches, cough, congestion, body aches, neck pain, sore throat, ear felt ringing and fluid filled. Home covid tests were all negative. Reports feeling little better now.  ?

## 2021-11-05 ENCOUNTER — Encounter: Payer: Self-pay | Admitting: Family Medicine

## 2021-11-05 ENCOUNTER — Telehealth: Payer: Medicaid Other | Admitting: Family Medicine

## 2021-11-05 DIAGNOSIS — R051 Acute cough: Secondary | ICD-10-CM

## 2021-11-05 NOTE — Progress Notes (Signed)
[place]  ? ?Only needed return to work note from [place] taking her out this past week ?No visit needed ? ? ? ?

## 2022-01-12 ENCOUNTER — Ambulatory Visit
Admission: EM | Admit: 2022-01-12 | Discharge: 2022-01-12 | Disposition: A | Discharge status: Home / Self Care | Payer: Medicaid Other | Attending: Urgent Care | Admitting: Urgent Care

## 2022-01-12 DIAGNOSIS — R591 Generalized enlarged lymph nodes: Secondary | ICD-10-CM | POA: Diagnosis not present

## 2022-01-12 DIAGNOSIS — D649 Anemia, unspecified: Secondary | ICD-10-CM

## 2022-01-12 DIAGNOSIS — R61 Generalized hyperhidrosis: Secondary | ICD-10-CM

## 2022-01-12 DIAGNOSIS — R634 Abnormal weight loss: Secondary | ICD-10-CM | POA: Diagnosis not present

## 2022-01-12 LAB — POCT RAPID STREP A (OFFICE): Rapid Strep A Screen: NEGATIVE

## 2022-01-12 MED ORDER — NAPROXEN 500 MG PO TABS
500.0000 mg | ORAL_TABLET | Freq: Two times a day (BID) | ORAL | 0 refills | Status: DC
Start: 2022-01-12 — End: 2022-03-05

## 2022-01-12 NOTE — ED Triage Notes (Signed)
Pt presents with swollen lymph nodes on back of neck, more left than right. Reports symptoms present for 2 weeks. Denies any other symptoms. Reports increased fatigue, and 7 lb weight loss over 3-4 days. ?

## 2022-01-12 NOTE — ED Provider Notes (Signed)
?[facility] ? ? ?MRN: [id number] DOB: 06-30-1991 ? ?Subjective:  ? ?Julie King is a 31 y.o. female presenting for 2 to 4-week history of progressively worsening fatigue, night sweats, swelling of the back of her neck which she suspects are lymph nodes.  Has also had a 7 pound weight loss in the past 3 to 4 days.  No throat pain, headaches, neck pain, chest pain, nausea, vomiting, abdominal pain.  Patient has a history of undifferentiated anemia.  She has previously tried to work with her PCP in [place] but has had a very difficult time getting follow-up and consistent work-up.  Would like recommendations on establishing care with a new one.  No personal cancer history. ? ?No current facility-administered medications for this encounter. ? ?Current Outpatient Medications:  ?  albuterol (VENTOLIN HFA) 108 (90 Base) MCG/ACT inhaler, Inhale 2 puffs into the lungs every 6 (six) hours as needed for wheezing or shortness of breath (Cough)., Disp: 18 g, Rfl: 0 ?  cetirizine (ZYRTEC ALLERGY) 10 MG tablet, Take 1 tablet (10 mg total) by mouth at bedtime., Disp: 30 tablet, Rfl: 5 ?  fluticasone (FLONASE) 50 MCG/ACT nasal spray, Place 2 sprays into both nostrils daily., Disp: 18 mL, Rfl: 0 ?  guaifenesin (HUMIBID E) 400 MG TABS tablet, Take 1 tablet 3 times daily as needed for chest congestion and cough, Disp: 21 tablet, Rfl: 0 ?  ipratropium (ATROVENT) 0.06 % nasal spray, Place 2 sprays into both nostrils 4 (four) times daily. As needed for nasal congestion, runny nose, Disp: 15 mL, Rfl: 0 ?  methylPREDNISolone (MEDROL DOSEPAK) 4 MG TBPK tablet, Take 24 mg on day 1, 20 mg on day 2, 16 mg on day 3, 12 mg on day 4, 8 mg on day 5, 4 mg on day 6., Disp: 21 tablet, Rfl: 0  ? ?Allergies  ?Allergen Reactions  ? Percocet [Oxycodone-Acetaminophen] Hives, Itching and Nausea Only  ? Pineapple Itching  ?  Fresh pineapples; pineapples from a can is okay  ? ? ?Past Medical History:  ?Diagnosis Date  ? Atrial  fibrillation ([place])   ? Family history of breast cancer   ? Headache 07/25/2017  ? Hypertension   ? Irritable bowel syndrome   ? Morbidly obese ([place]) 07/25/2017  ? QT prolongation   ? T wave inversion in EKG   ?  ? ?Past Surgical History:  ?Procedure Laterality Date  ? CESAREAN SECTION    ? CESAREAN SECTION N/A 01/31/2020  ? Procedure: CESAREAN SECTION;  Surgeon: Chancy Milroy, MD;  Location: [facility];  Service: Obstetrics;  Laterality: N/A;  ? CHOLECYSTECTOMY    ? HERNIA REPAIR    ? TONSILLECTOMY    ? ? ?Family History  ?Problem Relation Age of Onset  ? Hypertension Other   ? Diabetes Other   ? Crohn's disease Mother   ? Hypertension Mother   ? Thyroid disease Father   ? Diabetes Father   ? Breast cancer Maternal Aunt 30  ? Breast cancer Paternal Grandmother   ?     dx under 69  ? Breast cancer Other 32  ?     MGMs sister, currently has stage IV  ? ? ?Social History  ? ?Tobacco Use  ? Smoking status: Every Day  ?  Packs/day: 0.50  ?  Years: 10.00  ?  Pack years: 5.00  ?  Types: Cigarettes  ? Smokeless tobacco: Never  ? Tobacco comments:  ?  wants to  quit but states very difficult Jan 2020  ?Vaping Use  ? Vaping Use: Never used  ?Substance Use Topics  ? Alcohol use: Yes  ? Drug use: No  ? ? ?ROS ? ? ?Objective:  ? ?Vitals: ?BP 114/71   Pulse (!) 105   Temp 98.7 ?F (37.1 ?C)   Resp 19   LMP 12/29/2021   SpO2 97%  ? ?Physical Exam ?Constitutional:   ?   General: She is not in acute distress. ?   Appearance: Normal appearance. She is well-developed and normal weight. She is not ill-appearing, toxic-appearing or diaphoretic.  ?HENT:  ?   Head: Normocephalic and atraumatic.  ?   Right Ear: Tympanic membrane, ear canal and external ear normal. No drainage or tenderness. No middle ear effusion. There is no impacted cerumen. Tympanic membrane is not erythematous.  ?   Left Ear: Tympanic membrane, ear canal and external ear normal. No drainage or tenderness.  No middle ear effusion. There is no impacted cerumen.  Tympanic membrane is not erythematous.  ?   Nose: Nose normal. No congestion or rhinorrhea.  ?   Mouth/Throat:  ?   Mouth: Mucous membranes are moist. No oral lesions.  ?   Pharynx: No pharyngeal swelling, oropharyngeal exudate, posterior oropharyngeal erythema or uvula swelling.  ?   Tonsils: No tonsillar exudate or tonsillar abscesses.  ?Eyes:  ?   General: No scleral icterus.    ?   Right eye: No discharge.     ?   Left eye: No discharge.  ?   Extraocular Movements: Extraocular movements intact.  ?   Right eye: Normal extraocular motion.  ?   Left eye: Normal extraocular motion.  ?   Conjunctiva/sclera: Conjunctivae normal.  ?Cardiovascular:  ?   Rate and Rhythm: Normal rate and regular rhythm.  ?   Heart sounds: Normal heart sounds. No murmur heard. ?  No friction rub. No gallop.  ?Pulmonary:  ?   Effort: Pulmonary effort is normal. No respiratory distress.  ?   Breath sounds: No stridor. No wheezing, rhonchi or rales.  ?Chest:  ?   Chest wall: No tenderness.  ?Musculoskeletal:  ?   Cervical back: Normal range of motion and neck supple.  ?Lymphadenopathy:  ?   Head:  ?   Right side of head: No submental, submandibular, tonsillar, preauricular, posterior auricular or occipital adenopathy.  ?   Left side of head: Occipital adenopathy present. No submental, submandibular, tonsillar, preauricular or posterior auricular adenopathy.  ?   Cervical: No cervical adenopathy.  ?   Right cervical: No superficial, deep or posterior cervical adenopathy. ?   Left cervical: No superficial, deep or posterior cervical adenopathy.  ?   Upper Body:  ?   Right upper body: No supraclavicular, axillary or epitrochlear adenopathy.  ?   Left upper body: No supraclavicular, axillary or epitrochlear adenopathy.  ?Skin: ?   General: Skin is warm and dry.  ?Neurological:  ?   General: No focal deficit present.  ?   Mental Status: She is alert and oriented to person, place, and time.  ?Psychiatric:     ?   Mood and Affect: Mood normal.     ?    Behavior: Behavior normal.  ? ? ?Results for orders placed or performed during the hospital encounter of 01/12/22 (from the past 24 hour(s))  ?POCT rapid strep A     Status: None  ? Collection Time: 01/12/22  2:40 PM  ?Result Value Ref Range  ?  Rapid Strep A Screen Negative Negative  ? ? ?Assessment and Plan :  ? ?PDMP not reviewed this encounter. ? ?1. Lymphadenopathy   ?2. Night sweats   ?3. Loss of weight   ?4. Anemia, unspecified type   ? ?Differential is extensive.  For now, recommended naproxen for anti-inflammatory properties with respect to her lymphadenopathy.  Labs are pending.  Provided patient with information to establish care with a new primary care provider for continued work-up and follow-up.  I will discuss results with patient once they are available. Counseled patient on potential for adverse effects with medications prescribed/recommended today, ER and return-to-clinic precautions discussed, patient verbalized understanding. ? ?  ?Jaynee Eagles, PA-C ?01/12/22 1623 ? ?

## 2022-01-14 LAB — SPECIMEN STATUS REPORT

## 2022-01-14 LAB — MONONUCLEOSIS SCREEN: Mono Screen: NEGATIVE

## 2022-01-17 ENCOUNTER — Ambulatory Visit: Payer: Medicaid Other | Admitting: Nurse Practitioner

## 2022-01-18 ENCOUNTER — Telehealth (HOSPITAL_COMMUNITY): Payer: Self-pay | Admitting: Emergency Medicine

## 2022-01-18 LAB — MONONUCLEOSIS SCREEN

## 2022-01-18 NOTE — Telephone Encounter (Signed)
This RN reached out to [facility] for missing results from 5/17, per Julie King, APP's request.  The CBC is still showing in process and she has sent an urgent ticket for the result to be closed out and then she will fax all results over

## 2022-01-29 LAB — COMPREHENSIVE METABOLIC PANEL
ALT: 10 IU/L (ref 0–32)
AST: 11 IU/L (ref 0–40)
Albumin/Globulin Ratio: 1.3 (ref 1.2–2.2)
Albumin: 4 g/dL (ref 3.9–5.0)
Alkaline Phosphatase: 91 IU/L (ref 44–121)
BUN/Creatinine Ratio: 18 (ref 9–23)
BUN: 10 mg/dL (ref 6–20)
Bilirubin Total: 0.2 mg/dL (ref 0.0–1.2)
CO2: 21 mmol/L (ref 20–29)
Calcium: 9.2 mg/dL (ref 8.7–10.2)
Chloride: 106 mmol/L (ref 96–106)
Creatinine, Ser: 0.55 mg/dL — ABNORMAL LOW (ref 0.57–1.00)
Globulin, Total: 3.2 g/dL (ref 1.5–4.5)
Glucose: 115 mg/dL — ABNORMAL HIGH (ref 70–99)
Potassium: 3.7 mmol/L (ref 3.5–5.2)
Sodium: 142 mmol/L (ref 134–144)
Total Protein: 7.2 g/dL (ref 6.0–8.5)
eGFR: 126 mL/min/{1.73_m2} (ref >59)

## 2022-01-29 LAB — CBC WITH DIFFERENTIAL/PLATELET

## 2022-03-05 ENCOUNTER — Ambulatory Visit
Admission: EM | Admit: 2022-03-05 | Discharge: 2022-03-05 | Disposition: A | Discharge status: Home / Self Care | Payer: Medicaid Other | Attending: Urgent Care | Admitting: Urgent Care

## 2022-03-05 ENCOUNTER — Encounter: Payer: Self-pay | Admitting: Emergency Medicine

## 2022-03-05 DIAGNOSIS — B349 Viral infection, unspecified: Secondary | ICD-10-CM | POA: Diagnosis not present

## 2022-03-05 DIAGNOSIS — R430 Anosmia: Secondary | ICD-10-CM

## 2022-03-05 DIAGNOSIS — Z1152 Encounter for screening for COVID-19: Secondary | ICD-10-CM

## 2022-03-05 DIAGNOSIS — R432 Parageusia: Secondary | ICD-10-CM | POA: Diagnosis not present

## 2022-03-05 MED ORDER — PAXLOVID (300/100) 20 X 150 MG & 10 X 100MG PO TBPK: ORAL_TABLET | ORAL | 0 refills | Status: DC

## 2022-03-05 MED ORDER — PSEUDOEPHEDRINE HCL 60 MG PO TABS: 60.0000 mg | ORAL_TABLET | Freq: Three times a day (TID) | ORAL | 0 refills | Status: DC | PRN

## 2022-03-05 MED ORDER — PROMETHAZINE-DM 6.25-15 MG/5ML PO SYRP: 5.0000 mL | ORAL_SOLUTION | Freq: Three times a day (TID) | ORAL | 0 refills | Status: DC | PRN

## 2022-03-05 MED ORDER — CETIRIZINE HCL 10 MG PO TABS: 10.0000 mg | ORAL_TABLET | Freq: Every day | ORAL | 5 refills | Status: DC

## 2022-03-05 NOTE — Discharge Instructions (Addendum)
Go ahead and start Paxlovid to treat this as a COVID infection. For sore throat or cough try using a honey-based tea. Use 3 teaspoons of honey with juice squeezed from half lemon. Place shaved pieces of ginger into 1/2-1 cup of water and warm over stove top. Then mix the ingredients and repeat every 4 hours as needed. Please take ibuprofen '600mg'$  every 6 hours with food alternating with OR taken together with Tylenol '500mg'$ -'650mg'$  every 6 hours for throat pain, fevers, aches and pains. Hydrate very well with at least 2 liters of water. Eat light meals such as soups (chicken and noodles, vegetable, chicken and wild rice).  Do not eat foods that you are allergic to.  Taking an antihistamine like Zyrtec can help against postnasal drainage, sinus congestion which can cause sinus pain, sinus headaches, throat pain, painful swallowing, coughing.  You can take this together with pseudoephedrine (Sudafed) at a dose of 30-60 mg 3 times a day or twice daily as needed for the same kind of nasal drip, congestion.  Use the cough medications as needed.

## 2022-03-05 NOTE — ED Triage Notes (Signed)
Pt here with cough, congestion, sore throat, body aches and loss of taste and smell x 3 days.

## 2022-03-05 NOTE — ED Provider Notes (Signed)
[place]   MRN: [id number] DOB: March 19, 1991  Subjective:   Julie King is a 31 y.o. female presenting for 3-day history of acute onset coughing, congestion, difficulty breathing through her nose, throat pain, body aches.  In the past day she lost her sense of taste and smell.  She works in [employer].  Has a history of atrial fibrillation, QT prolongation, morbid obesity, hypertension.  She did a COVID test but that was before she lost her taste and smell.  This was negative.  She is a smoker.  No current facility-administered medications for this encounter.  Current Outpatient Medications:    albuterol (VENTOLIN HFA) 108 (90 Base) MCG/ACT inhaler, Inhale 2 puffs into the lungs every 6 (six) hours as needed for wheezing or shortness of breath (Cough)., Disp: 18 g, Rfl: 0   cetirizine (ZYRTEC ALLERGY) 10 MG tablet, Take 1 tablet (10 mg total) by mouth at bedtime., Disp: 30 tablet, Rfl: 5   fluticasone (FLONASE) 50 MCG/ACT nasal spray, Place 2 sprays into both nostrils daily., Disp: 18 mL, Rfl: 0   guaifenesin (HUMIBID E) 400 MG TABS tablet, Take 1 tablet 3 times daily as needed for chest congestion and cough, Disp: 21 tablet, Rfl: 0   ipratropium (ATROVENT) 0.06 % nasal spray, Place 2 sprays into both nostrils 4 (four) times daily. As needed for nasal congestion, runny nose, Disp: 15 mL, Rfl: 0   methylPREDNISolone (MEDROL DOSEPAK) 4 MG TBPK tablet, Take 24 mg on day 1, 20 mg on day 2, 16 mg on day 3, 12 mg on day 4, 8 mg on day 5, 4 mg on day 6., Disp: 21 tablet, Rfl: 0   naproxen (NAPROSYN) 500 MG tablet, Take 1 tablet (500 mg total) by mouth 2 (two) times daily with a meal., Disp: 30 tablet, Rfl: 0   Allergies  Allergen Reactions   Percocet [Oxycodone-Acetaminophen] Hives, Itching and Nausea Only   Pineapple Itching    Fresh pineapples; pineapples from a can is okay    Past Medical History:  Diagnosis Date   Atrial fibrillation ([place])    Family  history of breast cancer    Headache 07/25/2017   Hypertension    Irritable bowel syndrome    Morbidly obese ([place]) 07/25/2017   QT prolongation    T wave inversion in EKG      Past Surgical History:  Procedure Laterality Date   CESAREAN SECTION     CESAREAN SECTION N/A 01/31/2020   Procedure: CESAREAN SECTION;  Surgeon: Chancy Milroy, MD;  Location: [facility];  Service: Obstetrics;  Laterality: N/A;   CHOLECYSTECTOMY     HERNIA REPAIR     TONSILLECTOMY      Family History  Problem Relation Age of Onset   Hypertension Other    Diabetes Other    Crohn's disease Mother    Hypertension Mother    Thyroid disease Father    Diabetes Father    Breast cancer Maternal Aunt 34   Breast cancer Paternal Grandmother        dx under 90   Breast cancer Other 18       MGMs sister, currently has stage IV    Social History   Tobacco Use   Smoking status: Every Day    Packs/day: 0.50    Years: 10.00    Total pack years: 5.00    Types: Cigarettes   Smokeless tobacco: Never   Tobacco comments:    wants to quit  but states very difficult Jan 2020  Vaping Use   Vaping Use: Never used  Substance Use Topics   Alcohol use: Yes   Drug use: No    ROS   Objective:   Vitals: BP 140/86   Pulse 98   Temp 99.2 F (37.3 C)   Resp (!) 22   SpO2 98%   Physical Exam Constitutional:      General: She is not in acute distress.    Appearance: Normal appearance. She is well-developed. She is obese. She is not ill-appearing, toxic-appearing or diaphoretic.  HENT:     Head: Normocephalic and atraumatic.     Nose: Congestion and rhinorrhea present.     Mouth/Throat:     Mouth: Mucous membranes are moist.  Eyes:     General: No scleral icterus.       Right eye: No discharge.        Left eye: No discharge.     Extraocular Movements: Extraocular movements intact.  Cardiovascular:     Rate and Rhythm: Normal rate and regular rhythm.     Heart sounds: Normal heart sounds. No murmur  heard.    No friction rub. No gallop.  Pulmonary:     Effort: Pulmonary effort is normal. No respiratory distress.     Breath sounds: No stridor. No wheezing, rhonchi or rales.  Chest:     Chest wall: No tenderness.  Skin:    General: Skin is warm and dry.  Neurological:     General: No focal deficit present.     Mental Status: She is alert and oriented to person, place, and time.  Psychiatric:        Mood and Affect: Mood normal.        Behavior: Behavior normal.     Assessment and Plan :   PDMP not reviewed this encounter.  1. Acute viral syndrome   2. Encounter for screening for COVID-19   3. Loss of taste   4. Loss of smell    Will treat empirically for COVID-19 given her overall respiratory symptoms and the loss of taste and smell.  Start Paxlovid.  Use supportive care otherwise for an acute viral syndrome.  COVID and flu testing pending. Deferred imaging given clear cardiopulmonary exam, hemodynamically stable vital signs. Counseled patient on potential for adverse effects with medications prescribed/recommended today, ER and return-to-clinic precautions discussed, patient verbalized understanding.    Jaynee Eagles, PA-C 03/05/22 1421

## 2022-03-07 LAB — COVID-19, FLU A+B NAA
Influenza A, NAA: NOT DETECTED
Influenza B, NAA: NOT DETECTED
SARS-CoV-2, NAA: NOT DETECTED

## 2022-03-11 ENCOUNTER — Telehealth: Payer: Medicaid Other | Admitting: Physician Assistant

## 2022-03-11 ENCOUNTER — Telehealth: Payer: Self-pay

## 2022-03-11 ENCOUNTER — Telehealth: Payer: Medicaid Other

## 2022-03-11 DIAGNOSIS — K047 Periapical abscess without sinus: Secondary | ICD-10-CM

## 2022-03-11 MED ORDER — AMOXICILLIN 500 MG PO CAPS: 500.0000 mg | ORAL_CAPSULE | Freq: Three times a day (TID) | ORAL | 0 refills | Status: DC

## 2022-03-11 MED ORDER — CLINDAMYCIN HCL 300 MG PO CAPS: 300.0000 mg | ORAL_CAPSULE | Freq: Four times a day (QID) | ORAL | 0 refills | Status: AC

## 2022-03-11 NOTE — Telephone Encounter (Signed)
Patient verification complete (name and date of birth).  Patient called stating she needs a note stating she can return to work (was seen on 03/05/22). Patient made aware that she would have to come in and be re-evaluated (per provider). Patient verbalizes understanding and all questions answered.

## 2022-03-11 NOTE — Progress Notes (Signed)

## 2022-03-21 ENCOUNTER — Telehealth: Payer: Medicaid Other | Admitting: Physician Assistant

## 2022-03-21 DIAGNOSIS — U071 COVID-19: Secondary | ICD-10-CM

## 2022-03-21 NOTE — Progress Notes (Signed)
E-Visit  for Positive Covid Test Result  We are sorry you are not feeling well. We are here to help!  You have tested positive for COVID-19, meaning that you were infected with the novel coronavirus and could give the virus to others.  It is vitally important that you stay home so you do not spread it to others.      I have provided a work note to return to work today.   Please continue isolation at home, for at least 10 days since the start of your symptoms and until you have had 24 hours with no fever (without taking a fever reducer) and with improving of symptoms.  If you have no symptoms but tested positive (or all symptoms resolve after 5 days and you have no fever) you can leave your house but continue to wear a mask around others for an additional 5 days. If you have a fever,continue to stay home until you have had 24 hours of no fever. Most cases improve 5-10 days from onset but we have seen a small number of patients who have gotten worse after the 10 days.  Please be sure to watch for worsening symptoms and remain taking the proper precautions.   Go to the nearest hospital ED for assessment if fever/cough/breathlessness are severe or illness seems like a threat to life.    The following symptoms may appear 2-14 days after exposure: Fever Cough Shortness of breath or difficulty breathing Chills Repeated shaking with chills Muscle pain Headache Sore throat New loss of taste or smell Fatigue Congestion or runny nose Nausea or vomiting Diarrhea  You have been enrolled in [place] for COVID-19. Daily you will receive a questionnaire within the [facility] website. Our COVID-19 response team will be monitoring your responses daily.  You can use medication such as Mucinex DM   You may also take acetaminophen (Tylenol) as needed for fever.  HOME CARE: Only take medications as instructed by your medical team. Drink plenty of fluids and get plenty of rest. A steam or  ultrasonic humidifier can help if you have congestion.   GET HELP RIGHT AWAY IF YOU HAVE EMERGENCY WARNING SIGNS.  Call 911 or proceed to your closest emergency facility if: You develop worsening high fever. Trouble breathing Bluish lips or face Persistent pain or pressure in the chest New confusion Inability to wake or stay awake You cough up blood. Your symptoms become more severe Inability to hold down food or fluids  This list is not all possible symptoms. Contact your medical provider for any symptoms that are severe or concerning to you.    Your e-visit answers were reviewed by a board certified advanced clinical practitioner to complete your personal care plan.  Depending on the condition, your plan could have included both over the counter or prescription medications.  If there is a problem please reply once you have received a response from your provider.  Your safety is important to [place].  If you have drug allergies check your prescription carefully.    You can use MyChart to ask questions about today's visit, request a non-urgent call back, or ask for a work or school excuse for 24 hours related to this e-Visit. If it has been greater than 24 hours you will need to follow up with your provider, or enter a new e-Visit to address those concerns. You will get an e-mail in the next two days asking about your experience.  I hope that your e-visit  has been valuable and will speed your recovery. Thank you for using e-visits.  I provided 5 minutes of non face-to-face time during this encounter for chart review and documentation.

## 2022-03-21 NOTE — Progress Notes (Signed)
I have spent 5 minutes in review of e-visit questionnaire, review and updating patient chart, medical decision making and response to patient.   Kevaughn Ewing Cody Ricahrd Schwager, PA-C    

## 2022-04-15 ENCOUNTER — Telehealth: Payer: Medicaid Other | Admitting: Family Medicine

## 2022-04-15 DIAGNOSIS — N760 Acute vaginitis: Secondary | ICD-10-CM

## 2022-04-15 DIAGNOSIS — B9689 Other specified bacterial agents as the cause of diseases classified elsewhere: Secondary | ICD-10-CM

## 2022-04-15 MED ORDER — METRONIDAZOLE 500 MG PO TABS: 500.0000 mg | ORAL_TABLET | Freq: Three times a day (TID) | ORAL | 0 refills | Status: AC

## 2022-04-15 NOTE — Progress Notes (Signed)
E-Visit for Vaginal Symptoms  We are sorry that you are not feeling well. Here is how we plan to help! Based on what you shared with me it looks like you: May have a vaginosis due to bacteria  Vaginosis is an inflammation of the vagina that can result in discharge, itching and pain. The cause is usually a change in the normal balance of vaginal bacteria or an infection. Vaginosis can also result from reduced estrogen levels after menopause.  The most common causes of vaginosis are:   Bacterial vaginosis which results from an overgrowth of one on several organisms that are normally present in your vagina.   Yeast infections which are caused by a naturally occurring fungus called candida.   Vaginal atrophy (atrophic vaginosis) which results from the thinning of the vagina from reduced estrogen levels after menopause.   Trichomoniasis which is caused by a parasite and is commonly transmitted by sexual intercourse.  Factors that increase your risk of developing vaginosis include: Medications, such as antibiotics and steroids Uncontrolled diabetes Use of hygiene products such as bubble bath, vaginal spray or vaginal deodorant Douching Wearing damp or tight-fitting clothing Using an intrauterine device (IUD) for birth control Hormonal changes, such as those associated with pregnancy, birth control pills or menopause Sexual activity Having a sexually transmitted infection  Your treatment plan is Metronidazole or Flagyl 500mg twice a day for 7 days.  I have electronically sent this prescription into the pharmacy that you have chosen.  Be sure to take all of the medication as directed. Stop taking any medication if you develop a rash, tongue swelling or shortness of breath. Mothers who are breast feeding should consider pumping and discarding their breast milk while on these antibiotics. However, there is no consensus that infant exposure at these doses would be harmful.  Remember that  medication creams can weaken latex condoms. .   HOME CARE:  Good hygiene may prevent some types of vaginosis from recurring and may relieve some symptoms:  Avoid baths, hot tubs and whirlpool spas. Rinse soap from your outer genital area after a shower, and dry the area well to prevent irritation. Don't use scented or harsh soaps, such as those with deodorant or antibacterial action. Avoid irritants. These include scented tampons and pads. Wipe from front to back after using the toilet. Doing so avoids spreading fecal bacteria to your vagina.  Other things that may help prevent vaginosis include:  Don't douche. Your vagina doesn't require cleansing other than normal bathing. Repetitive douching disrupts the normal organisms that reside in the vagina and can actually increase your risk of vaginal infection. Douching won't clear up a vaginal infection. Use a latex condom. Both female and female latex condoms may help you avoid infections spread by sexual contact. Wear cotton underwear. Also wear pantyhose with a cotton crotch. If you feel comfortable without it, skip wearing underwear to bed. Yeast thrives in moist environments Your symptoms should improve in the next day or two.  GET HELP RIGHT AWAY IF:  You have pain in your lower abdomen ( pelvic area or over your ovaries) You develop nausea or vomiting You develop a fever Your discharge changes or worsens You have persistent pain with intercourse You develop shortness of breath, a rapid pulse, or you faint.  These symptoms could be signs of problems or infections that need to be evaluated by a medical provider now.  MAKE SURE YOU   Understand these instructions. Will watch your condition. Will get help right   away if you are not doing well or get worse.  Thank you for choosing an e-visit.  Your e-visit answers were reviewed by a board certified advanced clinical practitioner to complete your personal care plan. Depending upon the  condition, your plan could have included both over the counter or prescription medications.  Please review your pharmacy choice. Make sure the pharmacy is open so you can pick up prescription now. If there is a problem, you may contact your provider through [employer] and have the prescription routed to another pharmacy.  Your safety is important to [place]. If you have drug allergies check your prescription carefully.   For the next 24 hours you can use MyChart to ask questions about today's visit, request a non-urgent call back, or ask for a work or school excuse. You will get an email in the next two days asking about your experience. I hope that your e-visit has been valuable and will speed your recovery.   I have provided 5 minutes of non face to face time during this encounter for chart review and documentation.

## 2022-05-18 ENCOUNTER — Telehealth: Payer: Medicaid Other | Admitting: Physician Assistant

## 2022-05-18 DIAGNOSIS — K047 Periapical abscess without sinus: Secondary | ICD-10-CM | POA: Diagnosis not present

## 2022-05-18 MED ORDER — IBUPROFEN 600 MG PO TABS: 600.0000 mg | ORAL_TABLET | Freq: Three times a day (TID) | ORAL | 0 refills | Status: DC | PRN

## 2022-05-18 MED ORDER — CLINDAMYCIN HCL 300 MG PO CAPS: 300.0000 mg | ORAL_CAPSULE | Freq: Three times a day (TID) | ORAL | 0 refills | Status: DC

## 2022-05-18 NOTE — Addendum Note (Signed)
Addended by: Mar Daring on: 05/18/2022 12:13 PM   Modules accepted: Orders

## 2022-05-18 NOTE — Progress Notes (Signed)

## 2022-05-18 NOTE — Progress Notes (Signed)
   Thank you for the details you included in the comment boxes. Those details are very helpful in determining the best course of treatment for you and help [place] to provide the best care.Because you are having multiple complaints that will have to be treated separately, we recommend that you convert this visit to a video visit in order for the provider to better assess what is going on.  The provider will be able to give you a more accurate diagnosis and treatment plan if we can more freely discuss your symptoms and with the addition of a virtual examination.   If you convert to a video visit, we will bill your insurance (similar to an office visit) and you will not be charged for this e-Visit. You will be able to stay at home and speak with the first available [facility] advanced practice provider. The link to do a video visit is in the drop down Menu tab of your Welcome screen in [place].  I provided 5 minutes of non face-to-face time during this encounter for chart review and documentation.

## 2022-06-06 ENCOUNTER — Other Ambulatory Visit: Payer: Self-pay | Admitting: General Surgery

## 2022-06-06 DIAGNOSIS — Z803 Family history of malignant neoplasm of breast: Secondary | ICD-10-CM

## 2022-06-14 ENCOUNTER — Ambulatory Visit
Admission: EM | Admit: 2022-06-14 | Discharge: 2022-06-14 | Disposition: A | Discharge status: Home / Self Care | Payer: Medicaid Other | Attending: Emergency Medicine | Admitting: Emergency Medicine

## 2022-06-14 ENCOUNTER — Telehealth: Payer: Self-pay

## 2022-06-14 ENCOUNTER — Ambulatory Visit (INDEPENDENT_AMBULATORY_CARE_PROVIDER_SITE_OTHER): Payer: Medicaid Other

## 2022-06-14 DIAGNOSIS — J309 Allergic rhinitis, unspecified: Secondary | ICD-10-CM

## 2022-06-14 DIAGNOSIS — J189 Pneumonia, unspecified organism: Secondary | ICD-10-CM | POA: Diagnosis not present

## 2022-06-14 DIAGNOSIS — R059 Cough, unspecified: Secondary | ICD-10-CM

## 2022-06-14 DIAGNOSIS — J441 Chronic obstructive pulmonary disease with (acute) exacerbation: Secondary | ICD-10-CM | POA: Diagnosis not present

## 2022-06-14 MED ORDER — PROMETHAZINE-DM 6.25-15 MG/5ML PO SYRP: 5.0000 mL | ORAL_SOLUTION | Freq: Four times a day (QID) | ORAL | 0 refills | Status: DC | PRN

## 2022-06-14 MED ORDER — AZITHROMYCIN 250 MG PO TABS
ORAL_TABLET | ORAL | 0 refills | Status: AC
Start: 2022-06-14 — End: 2022-06-18

## 2022-06-14 MED ORDER — ALBUTEROL SULFATE HFA 108 (90 BASE) MCG/ACT IN AERS: 2.0000 | INHALATION_SPRAY | Freq: Four times a day (QID) | RESPIRATORY_TRACT | 5 refills | Status: AC | PRN

## 2022-06-14 MED ORDER — CETIRIZINE HCL 10 MG PO TABS: 10.0000 mg | ORAL_TABLET | Freq: Every day | ORAL | 1 refills | Status: DC

## 2022-06-14 MED ORDER — FLUTICASONE PROPIONATE 50 MCG/ACT NA SUSP: 1.0000 | Freq: Every day | NASAL | 1 refills | Status: DC

## 2022-06-14 MED ORDER — BUDESONIDE-FORMOTEROL FUMARATE 160-4.5 MCG/ACT IN AERO: 2.0000 | INHALATION_SPRAY | Freq: Two times a day (BID) | RESPIRATORY_TRACT | 1 refills | Status: DC

## 2022-06-14 MED ORDER — GUAIFENESIN 400 MG PO TABS: ORAL_TABLET | ORAL | 0 refills | Status: DC

## 2022-06-14 MED ORDER — MONTELUKAST SODIUM 10 MG PO TABS: 10.0000 mg | ORAL_TABLET | Freq: Every day | ORAL | 1 refills | Status: DC

## 2022-06-14 MED ORDER — AMOXICILLIN-POT CLAVULANATE 875-125 MG PO TABS
1.0000 | ORAL_TABLET | Freq: Two times a day (BID) | ORAL | 0 refills | Status: AC
Start: 2022-06-14 — End: 2022-06-19

## 2022-06-14 MED ORDER — IPRATROPIUM BROMIDE 0.06 % NA SOLN: 2.0000 | Freq: Three times a day (TID) | NASAL | 1 refills | Status: DC

## 2022-06-14 NOTE — ED Triage Notes (Signed)
Pt c/o cough and chest tightness.  Started: About 3-4 weeks   Home interventions: OTC cough meds

## 2022-06-14 NOTE — Telephone Encounter (Signed)
Radiology called to inform clinician of final chest x-ray results (around 12:11pm today). Provider made aware around 12:13pm today.

## 2022-06-14 NOTE — ED Provider Notes (Signed)
[facility]    CSN: [id number] Arrival date & time: 06/14/22  0240    HISTORY   Chief Complaint  Patient presents with   Cough   HPI Julie King is a pleasant, 31 y.o. female who presents to urgent care today. Pt c/o cough and chest tightness for the past 3 to 4 weeks.  States she has been taking over-the-counter cough medicine with no improvement.  Patient has run out of albuterol and is no longer taking any allergy occasions.  Patient has previously been seen by [facility] chest in [place] but has been lost to follow-up for the past few years.  Patient states she has had shortness of breath with exertion.  Patient reports worsening cough at night but cough persists throughout the day.  Patient reports currently smoking 1 pack of cigarettes per day.  Patient reports history of frequent episodes of pneumonia.  Patient denies fever, body aches, chills, cough productive of dark and sputum.  Patient reports rhinorrhea, nasal congestion, postnasal drip, frequent throat clearing.  The history is provided by the patient.   Past Medical History:  Diagnosis Date   Atrial fibrillation ([place])    Family history of breast cancer    Headache 07/25/2017   Hypertension    Irritable bowel syndrome    Morbidly obese ([place]) 07/25/2017   QT prolongation    T wave inversion in EKG    Patient Active Problem List   Diagnosis Date Noted   Family history of breast cancer    Essential hypertension 03/16/2020   History of atrial fibrillation 11/28/2019   Headache 07/25/2017   Morbidly obese ([place]) 07/25/2017   Past Surgical History:  Procedure Laterality Date   CESAREAN SECTION     CESAREAN SECTION N/A 01/31/2020   Procedure: CESAREAN SECTION;  Surgeon: Chancy Milroy, MD;  Location: [facility] LD ORS;  Service: Obstetrics;  Laterality: N/A;   CHOLECYSTECTOMY     HERNIA REPAIR     TONSILLECTOMY     OB History     Gravida  2   Para  2   Term  1   Preterm  1   AB      Living   2      SAB      IAB      Ectopic      Multiple  0   Live Births  2          Home Medications    Prior to Admission medications   Medication Sig Start Date End Date Taking? Authorizing Provider  albuterol (VENTOLIN HFA) 108 (90 Base) MCG/ACT inhaler Inhale 2 puffs into the lungs every 6 (six) hours as needed for wheezing or shortness of breath (Cough). 11/01/21   Lynden Oxford Scales, PA-C  cetirizine (ZYRTEC ALLERGY) 10 MG tablet Take 1 tablet (10 mg total) by mouth at bedtime. 03/05/22 09/01/22  Jaynee Eagles, PA-C  fluticasone (FLONASE) 50 MCG/ACT nasal spray Place 2 sprays into both nostrils daily. 11/01/21   Lynden Oxford Scales, PA-C  ibuprofen (ADVIL) 600 MG tablet Take 1 tablet (600 mg total) by mouth every 8 (eight) hours as needed. 05/18/22   Mar Daring, PA-C  ipratropium (ATROVENT) 0.06 % nasal spray Place 2 sprays into both nostrils 4 (four) times daily. As needed for nasal congestion, runny nose 11/01/21   Lynden Oxford Scales, PA-C    Family History Family History  Problem Relation Age of Onset   Hypertension Other    Diabetes Other  Crohn's disease Mother    Hypertension Mother    Thyroid disease Father    Diabetes Father    Breast cancer Maternal Aunt 48   Breast cancer Paternal Grandmother        dx under 93   Breast cancer Other 65       MGMs sister, currently has stage IV   Social History Social History   Tobacco Use   Smoking status: Every Day    Packs/day: 0.50    Years: 10.00    Total pack years: 5.00    Types: Cigarettes   Smokeless tobacco: Never   Tobacco comments:    wants to quit but states very difficult Jan 2020  Vaping Use   Vaping Use: Never used  Substance Use Topics   Alcohol use: Yes   Drug use: No   Allergies   Amoxicillin, Percocet [oxycodone-acetaminophen], and Pineapple  Review of Systems Review of Systems Pertinent findings revealed after performing a 14 point review of systems has been noted in the history  of present illness.  Physical Exam Triage Vital Signs ED Triage Vitals  Enc Vitals Group     BP 06/25/21 0827 (!) 147/82     Pulse Rate 06/25/21 0827 72     Resp 06/25/21 0827 18     Temp 06/25/21 0827 98.3 F (36.8 C)     Temp Source 06/25/21 0827 Oral     SpO2 06/25/21 0827 98 %     Weight --      Height --      Head Circumference --      Peak Flow --      Pain Score 06/25/21 0826 5     Pain Loc --      Pain Edu? --      Excl. in [place]? --   No data found.  Updated Vital Signs BP 129/85 (BP Location: Right Arm)   Pulse 94   Temp 98 F (36.7 C) (Oral)   Resp 18   LMP 05/30/2022 (Approximate)   SpO2 98%   Physical Exam Vitals and nursing note reviewed.  Constitutional:      General: She is not in acute distress.    Appearance: Normal appearance. She is not ill-appearing.  HENT:     Head: Normocephalic and atraumatic.     Salivary Glands: Right salivary gland is not diffusely enlarged or tender. Left salivary gland is not diffusely enlarged or tender.     Right Ear: Ear canal and external ear normal. No drainage. A middle ear effusion is present. There is no impacted cerumen. Tympanic membrane is bulging. Tympanic membrane is not injected or erythematous.     Left Ear: Ear canal and external ear normal. No drainage. A middle ear effusion is present. There is no impacted cerumen. Tympanic membrane is bulging. Tympanic membrane is not injected or erythematous.     Ears:     Comments: Bilateral EACs normal, both TMs bulging with clear fluid    Nose: Rhinorrhea present. No nasal deformity, septal deviation, signs of injury, nasal tenderness, mucosal edema or congestion. Rhinorrhea is clear.     Right Nostril: Occlusion present. No foreign body, epistaxis or septal hematoma.     Left Nostril: Occlusion present. No foreign body, epistaxis or septal hematoma.     Right Turbinates: Enlarged, swollen and pale.     Left Turbinates: Enlarged, swollen and pale.     Right Sinus: No  maxillary sinus tenderness or frontal sinus tenderness.  Left Sinus: No maxillary sinus tenderness or frontal sinus tenderness.     Mouth/Throat:     Lips: Pink. No lesions.     Mouth: Mucous membranes are moist. No oral lesions.     Pharynx: Oropharynx is clear. Uvula midline. No posterior oropharyngeal erythema or uvula swelling.     Tonsils: No tonsillar exudate. 0 on the right. 0 on the left.     Comments: Postnasal drip Eyes:     General: Lids are normal.        Right eye: No discharge.        Left eye: No discharge.     Extraocular Movements: Extraocular movements intact.     Conjunctiva/sclera: Conjunctivae normal.     Right eye: Right conjunctiva is not injected.     Left eye: Left conjunctiva is not injected.  Neck:     Trachea: Trachea and phonation normal.  Cardiovascular:     Rate and Rhythm: Normal rate and regular rhythm.     Pulses: Normal pulses.     Heart sounds: Normal heart sounds. No murmur heard.    No friction rub. No gallop.  Pulmonary:     Effort: Pulmonary effort is normal. Prolonged expiration present. No tachypnea, bradypnea, accessory muscle usage, respiratory distress or retractions.     Breath sounds: Decreased air movement present. No stridor or transmitted upper airway sounds. Examination of the left-lower field reveals decreased breath sounds. Decreased breath sounds present. No wheezing, rhonchi or rales.  Chest:     Chest wall: No tenderness.  Musculoskeletal:        General: Normal range of motion.     Cervical back: Normal range of motion and neck supple. Normal range of motion.  Lymphadenopathy:     Cervical: No cervical adenopathy.  Skin:    General: Skin is warm and dry.     Findings: No erythema or rash.  Neurological:     General: No focal deficit present.     Mental Status: She is alert and oriented to person, place, and time.  Psychiatric:        Mood and Affect: Mood normal.        Behavior: Behavior normal.     Visual  Acuity Right Eye Distance:   Left Eye Distance:   Bilateral Distance:    Right Eye Near:   Left Eye Near:    Bilateral Near:     [facility] / Diagnostics / Procedures:     Radiology DG Chest 2 View  Result Date: 06/14/2022 CLINICAL DATA:  Productive cough. EXAM: CHEST - 2 VIEW COMPARISON:  11/01/2021 FINDINGS: 2.5 cm nodular density in the LEFT mid lingula is new from comparison exam. Normal cardiac silhouette. RIGHT lung clear. No acute osseous abnormality. IMPRESSION: Probable focus of infection/pneumonia in the LEFT lingular lobe. Followup PA and lateral chest X-ray is recommended in 3-4 weeks following trial of antibiotic therapy to ensure resolution and exclude underlying malignancy. These results will be called to the ordering clinician or representative by the Radiologist Assistant, and communication documented in the PACS or [employer]. Electronically Signed   By: Suzy Bouchard M.D.   On: 06/14/2022 11:34    Procedures Procedures (including critical care time) EKG  Pending results:  Labs Reviewed - No data to display  Medications Ordered in UC: Medications - No data to display  UC Diagnoses / Final Clinical Impressions(s)   I have reviewed the triage vital signs and the nursing notes.  Pertinent labs &  imaging results that were available during my care of the patient were reviewed by me and considered in my medical decision making (see chart for details).    Final diagnoses:  Lingular pneumonia  COPD exacerbation ([place])  Allergic rhinitis, unspecified seasonality, unspecified trigger   ***  ED Prescriptions     Medication Sig Dispense Auth. Provider   amoxicillin-clavulanate (AUGMENTIN) 875-125 MG tablet Take 1 tablet by mouth 2 (two) times daily for 5 days. 10 tablet Lynden Oxford Scales, PA-C   azithromycin (ZITHROMAX) 250 MG tablet Take 2 tablets (500 mg total) by mouth daily for 1 day, THEN 1 tablet (250 mg total) daily for 4 days. 6 tablet Lynden Oxford Scales, PA-C   albuterol (VENTOLIN HFA) 108 (90 Base) MCG/ACT inhaler Inhale 2 puffs into the lungs every 6 (six) hours as needed for wheezing or shortness of breath (Cough). 18 g Lynden Oxford Scales, PA-C   fluticasone (FLONASE) 50 MCG/ACT nasal spray Place 1 spray into both nostrils daily. 47.4 mL Lynden Oxford Scales, PA-C   ipratropium (ATROVENT) 0.06 % nasal spray Place 2 sprays into both nostrils 3 (three) times daily. As needed for nasal congestion, runny nose 45 mL Lynden Oxford Scales, PA-C   cetirizine (ZYRTEC ALLERGY) 10 MG tablet Take 1 tablet (10 mg total) by mouth at bedtime. 90 tablet Lynden Oxford Scales, PA-C   montelukast (SINGULAIR) 10 MG tablet Take 1 tablet (10 mg total) by mouth at bedtime. 90 tablet Lynden Oxford Scales, PA-C   budesonide-formoterol [facility]) 160-4.5 MCG/ACT inhaler Inhale 2 puffs into the lungs every 12 (twelve) hours. 3 each Lynden Oxford Scales, PA-C      PDMP not reviewed this encounter.  Disposition Upon Discharge:  Condition: stable for discharge home Home: take medications as prescribed; routine discharge instructions as discussed; follow up as advised.  Patient presented with an acute illness with associated systemic symptoms and significant discomfort requiring urgent management. In my opinion, this is a condition that a prudent lay person (someone who possesses an average knowledge of health and medicine) may potentially expect to result in complications if not addressed urgently such as respiratory distress, impairment of bodily function or dysfunction of bodily organs.   Routine symptom specific, illness specific and/or disease specific instructions were discussed with the patient and/or caregiver at length.   As such, the patient has been evaluated and assessed, work-up was performed and treatment was provided in alignment with urgent care protocols and evidence based medicine.  Patient/parent/caregiver has been advised  that the patient may require follow up for further testing and treatment if the symptoms continue in spite of treatment, as clinically indicated and appropriate.  If the patient was tested for COVID-19, Influenza and/or RSV, then the patient/parent/guardian was advised to isolate at home pending the results of his/her diagnostic coronavirus test and potentially longer if they're positive. I have also advised pt that if his/her COVID-19 test returns positive, it's recommended to self-isolate for at least 10 days after symptoms first appeared AND until fever-free for 24 hours without fever reducer AND other symptoms have improved or resolved. Discussed self-isolation recommendations as well as instructions for household member/close contacts as per the [facility] and [place] [facility], and also gave patient the [place] packet with this information.  Patient/parent/caregiver has been advised to return to the [facility] or PCP in 3-5 days if no better; to PCP or the Emergency Department if new signs and symptoms develop, or if the current signs or symptoms continue to change or worsen for  further workup, evaluation and treatment as clinically indicated and appropriate  The patient will follow up with their current PCP if and as advised. If the patient does not currently have a PCP we will assist them in obtaining one.   The patient may need specialty follow up if the symptoms continue, in spite of conservative treatment and management, for further workup, evaluation, consultation and treatment as clinically indicated and appropriate.  Patient/parent/caregiver verbalized understanding and agreement of plan as discussed.  All questions were addressed during visit.  Please see discharge instructions below for further details of plan.  Discharge Instructions:   Discharge Instructions       Please see below for list of medications that I recommend to treat and alleviate your current symptoms:    Augmentin (amoxicillin - clavulanic  acid):  take 1 tablet twice daily for 5 days, you can take it with or without food.  This antibiotic can cause upset stomach, this will resolve once antibiotics are complete.  You are welcome to use a probiotic, eat yogurt, take Imodium while taking this medication.  Please avoid other systemic medications such as Maalox, Pepto-Bismol or milk of magnesia as they can interfere with your body's ability to absorb the antibiotics. Z-Pak (azithromycin):  take 2 tablets the first day, then take 1 tablet daily every day thereafter until complete.      Zyrtec (cetirizine): This is an excellent second-generation antihistamine that helps to reduce respiratory inflammatory response to environmental allergens.  In some patients, this medication can cause daytime sleepiness so I recommend that you take 1 tablet daily at bedtime.     Singulair (montelukast): This is a mast cell stabilizer that works well with antihistamines.  Mast cells are responsible for stimulating histamine production so you can imagine that if we can reduce the activity of your mast cells, then fewer histamines will be produced and inflammation caused by allergy exposure will be significantly reduced.  I recommend that you take this medication at the same time you take your antihistamine.   Flonase (fluticasone): This is a steroid nasal spray that you use once daily, 1 spray in each nare.  This medication does not work well if you decide to use it only used as you feel you need to, it works best used on a daily basis.  After 3 to 5 days of use, you will notice significant reduction of the inflammation and mucus production that is currently being caused by exposure to allergens, whether seasonal or environmental.  The most common side effect of this medication is nosebleeds.  If you experience a nosebleed, please discontinue use for 1 week, then feel free to resume.  I have provided you with a prescription.     Atrovent (ipratropium): This is an  excellent nasal decongestant spray I have added to your recommended nasal steroid that will not cause rebound congestion, please instill 2 sprays into each nare with each use.  Because nasal steroids can take several days before they begin to provide full benefit, I recommend that you use this spray in addition to the nasal steroid prescribed for you.  Please use it after you have used your nasal steroid and repeat up to 4 times daily as needed.  I have provided you with a prescription for this medication.      ProAir, Ventolin, Proventil (albuterol): This inhaled medication contains a short acting beta agonist bronchodilator.  This medication works on the smooth muscle that opens and constricts of your airways by  relaxing the muscle.  The result of relaxation of the smooth muscle is increased air movement and improved work of breathing.  This is a short acting medication that can be used every 4-6 hours as needed for increased work of breathing, shortness of breath, wheezing and excessive coughing.  I have provided you with a prescription.    Symbicort (budesonide and formoterol): Please inhale 2 puffs twice daily.  This inhaled medication contains a corticosteroid and long-acting form of albuterol.  The inhaled steroid and this medication  is not absorbed into the body and will not cause side effects such as increased blood sugar levels, irritability, sleeplessness or weight gain.  Inhaled corticosteroid are sort of like topical steroid creams but, as you can imagine, it is not practical to attempt to rub a steroid cream inside of your lungs.  The long-acting albuterol works similarly to the short acting albuterol found in your rescue inhaler but provides 24-hour relaxation of the smooth muscles that open and constrict your airways; your short acting rescue inhaler can only provide for a few hours this benefit for a few hours.  Please feel free to continue using your short acting rescue inhaler as often as  needed throughout the day for shortness of breath, wheezing, and cough.   Not taking your inhaled medications as prescribed can increase your risk of more frequent upper respiratory infections, lower respiratory disorders, skin reactions, and eye irritations that may or may not require the use of antibiotics and steroids and can result in loss of time at work, celebrations with family and friends as well as missed social opportunities.  Please reach out to [facility] to soon as possible to schedule a follow-up appointment.   Please follow-up within the next 5-7 days either with your primary care provider or urgent care to recheck your lungs.  Please follow-up sooner if your symptoms do not significantly improve.    Please follow-up within the next 3-4 weeks either with your primary care provider or urgent care if your symptoms do not resolve.          Thank you for visiting urgent care today.  We appreciate the opportunity to participate in your care.         This office note has been dictated using Museum/gallery curator.  Unfortunately, this method of dictation can sometimes lead to typographical or grammatical errors.  I apologize for your inconvenience in advance if this occurs.  Please do not hesitate to reach out to me if clarification is needed.

## 2022-06-14 NOTE — Discharge Instructions (Addendum)
Please see below for list of medications that I recommend to treat and alleviate your current symptoms:    Augmentin (amoxicillin - clavulanic acid):  take 1 tablet twice daily for 5 days, you can take it with or without food.  This antibiotic can cause upset stomach, this will resolve once antibiotics are complete.  You are welcome to use a probiotic, eat yogurt, take Imodium while taking this medication.  Please avoid other systemic medications such as Maalox, Pepto-Bismol or milk of magnesia as they can interfere with your body's ability to absorb the antibiotics. Z-Pak (azithromycin):  take 2 tablets the first day, then take 1 tablet daily every day thereafter until complete.      Zyrtec (cetirizine): This is an excellent second-generation antihistamine that helps to reduce respiratory inflammatory response to environmental allergens.  In some patients, this medication can cause daytime sleepiness so I recommend that you take 1 tablet daily at bedtime.     Singulair (montelukast): This is a mast cell stabilizer that works well with antihistamines.  Mast cells are responsible for stimulating histamine production so you can imagine that if we can reduce the activity of your mast cells, then fewer histamines will be produced and inflammation caused by allergy exposure will be significantly reduced.  I recommend that you take this medication at the same time you take your antihistamine.   Flonase (fluticasone): This is a steroid nasal spray that you use once daily, 1 spray in each nare.  This medication does not work well if you decide to use it only used as you feel you need to, it works best used on a daily basis.  After 3 to 5 days of use, you will notice significant reduction of the inflammation and mucus production that is currently being caused by exposure to allergens, whether seasonal or environmental.  The most common side effect of this medication is nosebleeds.  If you experience a nosebleed,  please discontinue use for 1 week, then feel free to resume.  I have provided you with a prescription.     Atrovent (ipratropium): This is an excellent nasal decongestant spray I have added to your recommended nasal steroid that will not cause rebound congestion, please instill 2 sprays into each nare with each use.  Because nasal steroids can take several days before they begin to provide full benefit, I recommend that you use this spray in addition to the nasal steroid prescribed for you.  Please use it after you have used your nasal steroid and repeat up to 4 times daily as needed.  I have provided you with a prescription for this medication.      ProAir, Ventolin, Proventil (albuterol): This inhaled medication contains a short acting beta agonist bronchodilator.  This medication works on the smooth muscle that opens and constricts of your airways by relaxing the muscle.  The result of relaxation of the smooth muscle is increased air movement and improved work of breathing.  This is a short acting medication that can be used every 4-6 hours as needed for increased work of breathing, shortness of breath, wheezing and excessive coughing.  I have provided you with a prescription.    Symbicort (budesonide and formoterol): Please inhale 2 puffs twice daily.  This inhaled medication contains a corticosteroid and long-acting form of albuterol.  The inhaled steroid and this medication  is not absorbed into the body and will not cause side effects such as increased blood sugar levels, irritability, sleeplessness or weight gain.  Inhaled corticosteroid are sort of like topical steroid creams but, as you can imagine, it is not practical to attempt to rub a steroid cream inside of your lungs.  The long-acting albuterol works similarly to the short acting albuterol found in your rescue inhaler but provides 24-hour relaxation of the smooth muscles that open and constrict your airways; your short acting rescue inhaler can  only provide for a few hours this benefit for a few hours.  Please feel free to continue using your short acting rescue inhaler as often as needed throughout the day for shortness of breath, wheezing, and cough.  Robitussin, Mucinex (guaifenesin): This is an expectorant.  This helps break up chest congestion and loosen up thick nasal drainage making phlegm and drainage more liquid and therefore easier to remove.  I recommend being 400 mg three times daily as needed.      Promethazine DM: Promethazine is both a nasal decongestant and an antinausea medication that makes most patients feel fairly sleepy.  The DM is dextromethorphan, a cough suppressant found in many over-the-counter cough medications.  Please take 5 mL before bedtime to minimize your cough which will help you sleep better.  I have sent a prescription for this medication to your pharmacy.   Not taking your inhaled medications as prescribed can increase your risk of more frequent upper respiratory infections, lower respiratory disorders, skin reactions, and eye irritations that may or may not require the use of antibiotics and steroids and can result in loss of time at work, celebrations with family and friends as well as missed social opportunities.  Please reach out to [facility] to soon as possible to schedule a follow-up appointment.   Please follow-up within the next 5-7 days either with your primary care provider or urgent care to recheck your lungs.  Please follow-up sooner if your symptoms do not significantly improve.    Please follow-up within the next 3-4 weeks either with your primary care provider or urgent care if your symptoms do not resolve.          Thank you for visiting urgent care today.  We appreciate the opportunity to participate in your care.

## 2022-06-22 ENCOUNTER — Ambulatory Visit
Admission: RE | Admit: 2022-06-22 | Discharge: 2022-06-22 | Disposition: A | Discharge status: Home / Self Care | Payer: Medicaid Other | Source: Ambulatory Visit | Attending: General Surgery | Admitting: General Surgery

## 2022-06-22 DIAGNOSIS — Z803 Family history of malignant neoplasm of breast: Secondary | ICD-10-CM

## 2022-06-22 MED ORDER — GADOPICLENOL 0.5 MMOL/ML IV SOLN
9.0000 mL | Freq: Once | INTRAVENOUS | Status: AC | PRN
  Administered 2022-06-22: 9 mL via INTRAVENOUS

## 2022-06-28 ENCOUNTER — Other Ambulatory Visit: Payer: Self-pay | Admitting: Genetic Counselor

## 2022-06-28 ENCOUNTER — Telehealth: Payer: Self-pay | Admitting: Genetic Counselor

## 2022-06-28 DIAGNOSIS — Z803 Family history of malignant neoplasm of breast: Secondary | ICD-10-CM

## 2022-06-28 NOTE — Telephone Encounter (Signed)
Scheduled appt per 10/27 referral. Pt is aware of appt date and time. Pt is aware to arrive 15 mins prior to appt time and to bring and updated insurance card. Pt is aware of appt location.   

## 2022-06-30 ENCOUNTER — Encounter: Payer: Self-pay | Admitting: *Deleted

## 2022-07-01 ENCOUNTER — Inpatient Hospital Stay: Payer: Medicaid Other | Attending: General Surgery

## 2022-07-25 ENCOUNTER — Encounter: Payer: Self-pay | Admitting: Genetic Counselor

## 2022-07-25 ENCOUNTER — Telehealth: Payer: Self-pay | Admitting: Genetic Counselor

## 2022-07-25 NOTE — Progress Notes (Signed)
The genetic counseling service was asked to order genetic testing based on recommendations from February 2023.  Patient did not follow through with genetic testing in Feb.  A lab appointment was made and order for genetic testing was placed on 06/28/2022.  The patient did not show for her lab appointment.  Patient will need to have another lab appointment and genetics will need to be contacted to place an order for testing if this is still desired.

## 2022-07-25 NOTE — Telephone Encounter (Signed)
error 

## 2022-08-28 ENCOUNTER — Emergency Department (HOSPITAL_COMMUNITY): Payer: Medicaid Other

## 2022-08-28 ENCOUNTER — Other Ambulatory Visit: Payer: Self-pay

## 2022-08-28 ENCOUNTER — Telehealth: Payer: Self-pay | Admitting: Pulmonary Disease

## 2022-08-28 ENCOUNTER — Emergency Department (HOSPITAL_COMMUNITY)
Admission: EM | Admit: 2022-08-28 | Discharge: 2022-08-28 | Disposition: A | Discharge status: Home / Self Care | Payer: Medicaid Other | Source: Home / Self Care | Attending: Emergency Medicine | Admitting: Emergency Medicine

## 2022-08-28 DIAGNOSIS — R042 Hemoptysis: Secondary | ICD-10-CM | POA: Insufficient documentation

## 2022-08-28 DIAGNOSIS — R918 Other nonspecific abnormal finding of lung field: Secondary | ICD-10-CM | POA: Insufficient documentation

## 2022-08-28 DIAGNOSIS — Z79899 Other long term (current) drug therapy: Secondary | ICD-10-CM | POA: Insufficient documentation

## 2022-08-28 DIAGNOSIS — F17210 Nicotine dependence, cigarettes, uncomplicated: Secondary | ICD-10-CM | POA: Insufficient documentation

## 2022-08-28 DIAGNOSIS — I10 Essential (primary) hypertension: Secondary | ICD-10-CM | POA: Insufficient documentation

## 2022-08-28 DIAGNOSIS — Z8679 Personal history of other diseases of the circulatory system: Secondary | ICD-10-CM | POA: Insufficient documentation

## 2022-08-28 DIAGNOSIS — Z20822 Contact with and (suspected) exposure to covid-19: Secondary | ICD-10-CM | POA: Insufficient documentation

## 2022-08-28 LAB — BASIC METABOLIC PANEL
Anion gap: 8 (ref 5–15)
BUN: 8 mg/dL (ref 6–20)
CO2: 25 mmol/L (ref 22–32)
Calcium: 8.8 mg/dL — ABNORMAL LOW (ref 8.9–10.3)
Chloride: 107 mmol/L (ref 98–111)
Creatinine, Ser: 0.71 mg/dL (ref 0.44–1.00)
GFR, Estimated: >60 mL/min (ref >60)
Glucose, Bld: 99 mg/dL (ref 70–99)
Potassium: 4.2 mmol/L (ref 3.5–5.1)
Sodium: 140 mmol/L (ref 135–145)

## 2022-08-28 LAB — CBC
HCT: 33.5 % — ABNORMAL LOW (ref 36.0–46.0)
Hemoglobin: 10.2 g/dL — ABNORMAL LOW (ref 12.0–15.0)
MCH: 17.6 pg — ABNORMAL LOW (ref 26.0–34.0)
MCHC: 30.4 g/dL (ref 30.0–36.0)
MCV: 57.9 fL — ABNORMAL LOW (ref 80.0–100.0)
Platelets: 310 10*3/uL (ref 150–400)
RBC: 5.79 MIL/uL — ABNORMAL HIGH (ref 3.87–5.11)
RDW: 19.3 % — ABNORMAL HIGH (ref 11.5–15.5)
WBC: 11.7 10*3/uL — ABNORMAL HIGH (ref 4.0–10.5)
nRBC: 0 % (ref 0.0–0.2)

## 2022-08-28 LAB — TROPONIN I (HIGH SENSITIVITY)
Troponin I (High Sensitivity): 3 ng/L (ref <18)
Troponin I (High Sensitivity): 3 ng/L (ref <18)

## 2022-08-28 LAB — RESP PANEL BY RT-PCR (RSV, FLU A&B, COVID)  RVPGX2
Influenza A by PCR: NEGATIVE
Influenza B by PCR: NEGATIVE
Resp Syncytial Virus by PCR: NEGATIVE
SARS Coronavirus 2 by RT PCR: NEGATIVE

## 2022-08-28 LAB — I-STAT BETA HCG BLOOD, ED (MC, WL, AP ONLY): I-stat hCG, quantitative: <5 m[IU]/mL (ref <5)

## 2022-08-28 MED ORDER — IOHEXOL 350 MG/ML SOLN
75.0000 mL | Freq: Once | INTRAVENOUS | Status: AC | PRN
  Administered 2022-08-28: 75 mL via INTRAVENOUS

## 2022-08-28 NOTE — ED Provider Triage Note (Signed)
Emergency Medicine Provider Triage Evaluation Note  BRYCE KIMBLE , a 31 y.o. female  was evaluated in triage.  Pt complains of hemoptysis. 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  Have been coughing up more blood.  Was seen by her provider 2 days ago and had a chest CT done.  Haven't got the result yet but her sxs worsen.  Sts lung mass has doubled in size.    Review of Systems  Positive: As above Negative: As above  Physical Exam  BP (!) 168/111   Pulse 92   Temp 99.4 F (37.4 C)   Resp 16   SpO2 100%  Gen:   Awake, no distress   Resp:  Normal effort  MSK:   Moves extremities without difficulty  Other:    Medical Decision Making  Medically screening exam initiated at 10:40 AM.  Appropriate orders placed.  FIANA GLADU was informed that the remainder of the evaluation will be completed by another provider, this initial triage assessment does not replace that evaluation, and the importance of remaining in the ED until their evaluation is complete.     Domenic Moras, PA-C 08/28/22 1045

## 2022-08-28 NOTE — Discharge Instructions (Addendum)
Bronchoscopy Instructions:   Date: 08/30/2022 TIME: Arrive at [facility] at [place]: [facility] entrance to [facility]   Nothing to eat past midnight the night before  __________________________________________________________________

## 2022-08-28 NOTE — Plan of Care (Signed)
Bronchoscopy Instructions:    Date: 08/30/2022 TIME: Arrive at [facility] at [place]: [facility] entrance to [facility]    Nothing to eat past midnight the night before  Garner Nash, [place] [facility] 08/28/2022 5:13 PM

## 2022-08-28 NOTE — ED Provider Notes (Signed)
[facility] EMERGENCY DEPARTMENT Provider Note   CSN: [id number] Arrival date & time: 08/28/22  1002     History {Add pertinent medical, surgical, social history, OB history to HPI:1} Chief Complaint  Patient presents with   Hemoptysis    Julie King is a 31 y.o. female.  31 year old female with prior medical history as detailed below presents for evaluation.  Patient with no history of lung mass.  Patient with bronchoscopy with tissue sampling approximate 1 month ago.  This apparently was negative for malignancy.  Patient reports that apparently her mass has grown.  She reports increasing frequency of bloody hemoptysis over the last 3 to 4 days.  She is followed by Dr. Hilton Cork (pulmonology) at [facility].  She is not on current anticoagulation.  She denies recent fever or chills.  She reports persistent chronic cough.  She reports increasing bright red hemoptysis with cough over the last 3 to 4 days.  The history is provided by the patient and medical records.       Home Medications Prior to Admission medications   Medication Sig Start Date End Date Taking? Authorizing Provider  albuterol (VENTOLIN HFA) 108 (90 Base) MCG/ACT inhaler Inhale 2 puffs into the lungs every 6 (six) hours as needed for wheezing or shortness of breath (Cough). 06/14/22   Lynden Oxford Scales, PA-C  budesonide-formoterol (SYMBICORT) 160-4.5 MCG/ACT inhaler Inhale 2 puffs into the lungs every 12 (twelve) hours. 06/14/22 12/11/22  Lynden Oxford Scales, PA-C  cetirizine (ZYRTEC ALLERGY) 10 MG tablet Take 1 tablet (10 mg total) by mouth at bedtime. 06/14/22 12/11/22  Lynden Oxford Scales, PA-C  fluticasone (FLONASE) 50 MCG/ACT nasal spray Place 1 spray into both nostrils daily. 06/14/22   Lynden Oxford Scales, PA-C  guaifenesin (HUMIBID E) 400 MG TABS tablet Take 1 tablet 3 times daily as needed for chest congestion and cough 06/14/22   Lynden Oxford Scales, PA-C  ibuprofen  (ADVIL) 600 MG tablet Take 1 tablet (600 mg total) by mouth every 8 (eight) hours as needed. 05/18/22   Mar Daring, PA-C  ipratropium (ATROVENT) 0.06 % nasal spray Place 2 sprays into both nostrils 3 (three) times daily. As needed for nasal congestion, runny nose 06/14/22   Lynden Oxford Scales, PA-C  montelukast (SINGULAIR) 10 MG tablet Take 1 tablet (10 mg total) by mouth at bedtime. 06/14/22 12/11/22  Lynden Oxford Scales, PA-C  promethazine-dextromethorphan (PROMETHAZINE-DM) 6.25-15 MG/5ML syrup Take 5 mLs by mouth 4 (four) times daily as needed for cough. 06/14/22   Lynden Oxford Scales, PA-C      Allergies    Amoxicillin, Percocet [oxycodone-acetaminophen], and Pineapple    Review of Systems   Review of Systems  All other systems reviewed and are negative.   Physical Exam Updated Vital Signs BP (!) 168/111   Pulse 92   Temp 99.4 F (37.4 C)   Resp 16   SpO2 100%  Physical Exam Vitals and nursing note reviewed.  Constitutional:      General: She is not in acute distress.    Appearance: Normal appearance. She is well-developed.  HENT:     Head: Normocephalic and atraumatic.  Eyes:     Conjunctiva/sclera: Conjunctivae normal.     Pupils: Pupils are equal, round, and reactive to light.  Cardiovascular:     Rate and Rhythm: Normal rate and regular rhythm.     Heart sounds: Normal heart sounds.  Pulmonary:     Effort: Pulmonary effort is normal. No respiratory distress.  Breath sounds: Normal breath sounds.  Abdominal:     General: There is no distension.     Palpations: Abdomen is soft.     Tenderness: There is no abdominal tenderness.  Musculoskeletal:        General: No deformity. Normal range of motion.     Cervical back: Normal range of motion and neck supple.  Skin:    General: Skin is warm and dry.  Neurological:     General: No focal deficit present.     Mental Status: She is alert and oriented to person, place, and time.     ED Results /  Procedures / Treatments   Labs (all labs ordered are listed, but only abnormal results are displayed) Labs Reviewed  BASIC METABOLIC PANEL - Abnormal; Notable for the following components:      Result Value   Calcium 8.8 (*)    All other components within normal limits  CBC - Abnormal; Notable for the following components:   WBC 11.7 (*)    RBC 5.79 (*)    Hemoglobin 10.2 (*)    HCT 33.5 (*)    MCV 57.9 (*)    MCH 17.6 (*)    RDW 19.3 (*)    All other components within normal limits  I-STAT BETA HCG BLOOD, ED ([facility], [facility], [facility] ONLY)  TROPONIN I (HIGH SENSITIVITY)  TROPONIN I (HIGH SENSITIVITY)    EKG None  Radiology DG Chest 2 View  Result Date: 08/28/2022 CLINICAL DATA:  Chest pain. Patient has a LEFT lung nodule that as being followed at outside facilities. EXAM: CHEST - 2 VIEW COMPARISON:  06/14/2022. More recent outside studies are not available for comparison. FINDINGS: The cardiomediastinal silhouette is unremarkable. Lingular nodule/mass appears increased in size now measuring 3 cm. The remainder of the lungs are clear. A possible trace LEFT pleural effusion again noted. No pneumothorax. No acute bony abnormality identified. IMPRESSION: 1. 3 cm lingular nodule/mass. We do not have access to prior recent outside imaging for comparison. 2. Possible trace LEFT pleural effusion. Electronically Signed   By: Margarette Canada M.D.   On: 08/28/2022 12:15    Procedures Procedures  {Document cardiac monitor, telemetry assessment procedure when appropriate:1}  Medications Ordered in ED Medications - No data to display  ED Course/ Medical Decision Making/ A&P                           Medical Decision Making Amount and/or Complexity of Data Reviewed Labs: ordered. Radiology: ordered.   ***  {Document critical care time when appropriate:1} {Document review of labs and clinical decision tools ie heart score, Chads2Vasc2 etc:1}  {Document your independent review of radiology images,  and any outside records:1} {Document your discussion with family members, caretakers, and with consultants:1} {Document social determinants of health affecting pt's care:1} {Document your decision making why or why not admission, treatments were needed:1} Final Clinical Impression(s) / ED Diagnoses Final diagnoses:  None    Rx / DC Orders ED Discharge Orders     None

## 2022-08-28 NOTE — Telephone Encounter (Signed)
Arranging Nav bronchoscopy with Dr. Valeta Harms at [facility] on 08/30/22

## 2022-08-28 NOTE — ED Provider Notes (Signed)
Clinical Course as of 08/28/22 1721  Sun Aug 28, 2022  1506 Stable 32 YOF with a chief complaint of hemoptysis. Known mass CTA done. Pulmonary consults. HGB at 10 down slowly from 11.   [CC]  1641 Per Pulm Covid swab and dispo [CC]    Clinical Course User Index [CC] Tretha Sciara, MD   Reassessed the bedside.  No further hemoptysis.  Will have her follow-up with pulmonology in outpatient setting.  Return cautions regarding interval worsening reinforced.   Tretha Sciara, MD 08/28/22 1725

## 2022-08-28 NOTE — ED Triage Notes (Signed)
Pt reports coughing up blood since Nov 7th, known lung mass. Pt reports bloody sputum is getting progressively worse. Pt awake,alert, appropriate. VSS.

## 2022-08-28 NOTE — Consult Note (Signed)
NAME:  Julie ALESHIRE, MRN:  [id number], DOB:  [birth year], LOS: 0 ADMISSION DATE:  08/28/2022, CONSULTATION DATE: August 28, 2022 REFERRING MD: Dr. Oswald Hillock, CHIEF COMPLAINT: Hemoptysis  History of Present Illness:  31 year old female who started smoking cigarettes at age 66 now smokes 1 pack of cigarettes daily presents to the [facility] emergency department complaining of hemoptysis.  She says that she developed a 1.5 cm nodule earlier this year and has been followed by pulmonology in [place].  She had a navigational bronc at [place] in November which did not show evidence of malignancy.  Since then she was told they would follow with serial imaging but she subsequently developed hemoptysis.  She had a repeat scan which showed that it increased in size.  Arrangements were being made for a transthoracic needle aspiration.  However, she has not her anything more about this.  She says that she continues to cough up blood and she presented to the emergency department today because she coughed up about a tablespoon of very bloody mucus which is the most she is ever coughed up.  In the last week she has had some mild chest tightness and shortness of breath.  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  She continues to smoke cigarettes.  Denies any fevers or chills.  She is unaware if she had any cultures sent on the bronchoscopy from the last visit.  Currently no dyspnea, no hemoptysis since coming to the ER.  Cough has been bothering her a bit.   Pertinent  Medical History   Past Medical History:  Diagnosis Date   Atrial fibrillation ([place])    Family history of breast cancer    Headache 07/25/2017   Hypertension    Irritable bowel syndrome    Morbidly obese ([place]) 07/25/2017   QT prolongation    T wave inversion in EKG      Significant Hospital Events: Including procedures, antibiotic  start and stop dates in addition to other pertinent events   12/31 CT chest > no PE, 3.4 cm left lung mass in lingula and left lower lobe spanning the fissure, no clear pathologically enlarged lymph nodes  Interim History / Subjective:  As above  Objective   Blood pressure 130/89, pulse 93, temperature 98.4 F (36.9 C), temperature source Oral, resp. rate 18, SpO2 98 %.       No intake or output data in the 24 hours ending 08/28/22 1635 There were no vitals filed for this visit.  Examination:  General:  Resting comfortably in bed HENT: NCAT OP clear PULM: CTA B, normal effort CV: RRR, no mgr GI: BS+, soft, nontender MSK: normal bulk and tone Neuro: awake, alert, no distress, MAEW   Resolved Hospital Problem list     Assessment & Plan:  Cigarette smoker Lung mass, left lung Hemoptysis Obesity History of A-fib, currently in sinus rhythm Mild dyspnea Asthma?  Discussion: 31 year old female with a growing left upper lobe mass which spans the interlobar fissure with hemoptysis.  Given her cigarette smoking history there is a high index of suspicion for malignancy and this needs to be biopsied again immediately.  Plan: We will make arrangements for an outpatient bronchoscopy on August 30, 2021 with Dr. June Leap at [facility] The patient should have a COVID swab today as part of a preop evaluation Stay n.p.o. on January 2 in the morning  Continue outpatient albuterol as needed With discharge home with Tussionex 5 mL every 12 hours as needed cough  We will contact our office to help make further arrangements.  Best Practice (right click and "Reselect all SmartList Selections" daily)   Per [facility]  Labs   CBC: Recent Labs  Lab 08/28/22 1032  WBC 11.7*  HGB 10.2*  HCT 33.5*  MCV 57.9*  PLT 354    Basic Metabolic Panel: Recent Labs  Lab 08/28/22 1032  NA 140  K 4.2  CL 107  CO2 25  GLUCOSE 99  BUN 8  CREATININE 0.71  CALCIUM 8.8*    GFR: CrCl cannot be calculated (Unknown ideal weight.). Recent Labs  Lab 08/28/22 1032  WBC 11.7*    Liver Function Tests: No results for input(s): "AST", "ALT", "ALKPHOS", "BILITOT", "PROT", "ALBUMIN" in the last 168 hours. No results for input(s): "LIPASE", "AMYLASE" in the last 168 hours. No results for input(s): "AMMONIA" in the last 168 hours.  ABG    Component Value Date/Time   TCO2 25 06/28/2021 1106     Coagulation Profile: No results for input(s): "INR", "PROTIME" in the last 168 hours.  Cardiac Enzymes: No results for input(s): "CKTOTAL", "CKMB", "CKMBINDEX", "TROPONINI" in the last 168 hours.  HbA1C: No results found for: "HGBA1C"  CBG: No results for input(s): "GLUCAP" in the last 168 hours.  Review of Systems:   Gen: Denies fever, chills, weight change, fatigue, night sweats HEENT: Denies blurred vision, double vision, hearing loss, tinnitus, sinus congestion, rhinorrhea, sore throat, neck stiffness, dysphagia PULM: per HPI CV: Denies chest pain, edema, orthopnea, paroxysmal nocturnal dyspnea, palpitations GI: Denies abdominal pain, nausea, vomiting, diarrhea, hematochezia, melena, constipation, change in bowel habits GU: Denies dysuria, hematuria, polyuria, oliguria, urethral discharge Endocrine: Denies hot or cold intolerance, polyuria, polyphagia or appetite change Derm: Denies rash, dry skin, scaling or peeling skin change Heme: Denies easy bruising, bleeding, bleeding gums Neuro: Denies headache, numbness, weakness, slurred speech, loss of memory or consciousness   Past Medical History:  She,  has a past medical history of Atrial fibrillation ([place]), Family history of breast cancer, Headache (07/25/2017), Hypertension, Irritable bowel syndrome, Morbidly obese ([place]) (07/25/2017), QT prolongation, and T wave inversion in EKG.   Surgical History:   Past Surgical History:  Procedure Laterality Date   CESAREAN SECTION     CESAREAN SECTION N/A  01/31/2020   Procedure: CESAREAN SECTION;  Surgeon: Chancy Milroy, MD;  Location: [facility] LD ORS;  Service: Obstetrics;  Laterality: N/A;   CHOLECYSTECTOMY     HERNIA REPAIR     TONSILLECTOMY       Social History:   reports that she has been smoking cigarettes. She has a 5.00 pack-year smoking history. She has never used smokeless tobacco. She reports current alcohol use. She reports that she does not use drugs.   Family History:  Her family history includes Breast cancer in her paternal grandmother; Breast cancer (age of onset: 35) in her maternal aunt; Breast cancer (age of onset: 57) in an other family member; Crohn's disease in her mother; Diabetes in her father and another family member; Hypertension in her mother and another family member; Thyroid disease in her father.   Allergies Allergies  Allergen Reactions   Amoxicillin Itching   Percocet [Oxycodone-Acetaminophen] Hives, Itching and Nausea Only   Pineapple Itching    Fresh pineapples; pineapples from a can is okay     Home Medications  Prior to Admission medications   Medication Sig Start Date  End Date Taking? Authorizing Provider  albuterol (VENTOLIN HFA) 108 (90 Base) MCG/ACT inhaler Inhale 2 puffs into the lungs every 6 (six) hours as needed for wheezing or shortness of breath (Cough). 06/14/22   [place] Oxford Scales, PA-C  budesonide-formoterol (SYMBICORT) 160-4.5 MCG/ACT inhaler Inhale 2 puffs into the lungs every 12 (twelve) hours. 06/14/22 12/11/22  [place] Oxford Scales, PA-C  cetirizine (ZYRTEC ALLERGY) 10 MG tablet Take 1 tablet (10 mg total) by mouth at bedtime. 06/14/22 12/11/22  Lynden Oxford Scales, PA-C  fluticasone (FLONASE) 50 MCG/ACT nasal spray Place 1 spray into both nostrils daily. 06/14/22   Lynden Oxford Scales, PA-C  guaifenesin (HUMIBID E) 400 MG TABS tablet Take 1 tablet 3 times daily as needed for chest congestion and cough 06/14/22   [place] Oxford Scales, PA-C  ibuprofen (ADVIL) 600 MG tablet  Take 1 tablet (600 mg total) by mouth every 8 (eight) hours as needed. 05/18/22   Mar Daring, PA-C  ipratropium (ATROVENT) 0.06 % nasal spray Place 2 sprays into both nostrils 3 (three) times daily. As needed for nasal congestion, runny nose 06/14/22   [place] Oxford Scales, PA-C  montelukast (SINGULAIR) 10 MG tablet Take 1 tablet (10 mg total) by mouth at bedtime. 06/14/22 12/11/22  [place] Oxford Scales, PA-C  promethazine-dextromethorphan (PROMETHAZINE-DM) 6.25-15 MG/5ML syrup Take 5 mLs by mouth 4 (four) times daily as needed for cough. 06/14/22   [place] Oxford Scales, PA-C     Critical care time: n/a    Roselie Awkward, MD [place] [facility] Pager: [phone] Cell: [phone] After 7:00 pm call Elink  [phone]

## 2022-08-29 ENCOUNTER — Encounter (HOSPITAL_COMMUNITY): Payer: Self-pay

## 2022-08-29 ENCOUNTER — Inpatient Hospital Stay (HOSPITAL_COMMUNITY)
Admission: EM | Admit: 2022-08-29 | Discharge: 2022-09-01 | DRG: 167 | Disposition: A | Discharge status: Home / Self Care | Payer: Medicaid Other | Attending: Pulmonary Disease | Admitting: Pulmonary Disease

## 2022-08-29 ENCOUNTER — Inpatient Hospital Stay (HOSPITAL_COMMUNITY): Payer: Medicaid Other

## 2022-08-29 ENCOUNTER — Other Ambulatory Visit: Payer: Self-pay | Admitting: Pulmonary Disease

## 2022-08-29 ENCOUNTER — Other Ambulatory Visit: Payer: Self-pay

## 2022-08-29 DIAGNOSIS — I480 Paroxysmal atrial fibrillation: Secondary | ICD-10-CM | POA: Diagnosis present

## 2022-08-29 DIAGNOSIS — R042 Hemoptysis: Principal | ICD-10-CM

## 2022-08-29 DIAGNOSIS — F17210 Nicotine dependence, cigarettes, uncomplicated: Secondary | ICD-10-CM | POA: Diagnosis present

## 2022-08-29 DIAGNOSIS — Z8249 Family history of ischemic heart disease and other diseases of the circulatory system: Secondary | ICD-10-CM

## 2022-08-29 DIAGNOSIS — Z833 Family history of diabetes mellitus: Secondary | ICD-10-CM | POA: Diagnosis not present

## 2022-08-29 DIAGNOSIS — K589 Irritable bowel syndrome without diarrhea: Secondary | ICD-10-CM | POA: Diagnosis present

## 2022-08-29 DIAGNOSIS — Z1152 Encounter for screening for COVID-19: Secondary | ICD-10-CM | POA: Diagnosis not present

## 2022-08-29 DIAGNOSIS — Z79899 Other long term (current) drug therapy: Secondary | ICD-10-CM | POA: Diagnosis not present

## 2022-08-29 DIAGNOSIS — Z7951 Long term (current) use of inhaled steroids: Secondary | ICD-10-CM | POA: Diagnosis not present

## 2022-08-29 DIAGNOSIS — F419 Anxiety disorder, unspecified: Secondary | ICD-10-CM | POA: Diagnosis present

## 2022-08-29 DIAGNOSIS — Z885 Allergy status to narcotic agent status: Secondary | ICD-10-CM | POA: Diagnosis not present

## 2022-08-29 DIAGNOSIS — I4891 Unspecified atrial fibrillation: Secondary | ICD-10-CM | POA: Diagnosis not present

## 2022-08-29 DIAGNOSIS — Z803 Family history of malignant neoplasm of breast: Secondary | ICD-10-CM

## 2022-08-29 DIAGNOSIS — Z6841 Body Mass Index (BMI) 40.0 and over, adult: Secondary | ICD-10-CM | POA: Diagnosis not present

## 2022-08-29 DIAGNOSIS — R918 Other nonspecific abnormal finding of lung field: Secondary | ICD-10-CM | POA: Diagnosis present

## 2022-08-29 DIAGNOSIS — I10 Essential (primary) hypertension: Secondary | ICD-10-CM | POA: Diagnosis present

## 2022-08-29 DIAGNOSIS — Z91018 Allergy to other foods: Secondary | ICD-10-CM | POA: Diagnosis not present

## 2022-08-29 LAB — COMPREHENSIVE METABOLIC PANEL
ALT: 10 U/L (ref 0–44)
AST: 15 U/L (ref 15–41)
Albumin: 3.2 g/dL — ABNORMAL LOW (ref 3.5–5.0)
Alkaline Phosphatase: 72 U/L (ref 38–126)
Anion gap: 9 (ref 5–15)
BUN: 11 mg/dL (ref 6–20)
CO2: 21 mmol/L — ABNORMAL LOW (ref 22–32)
Calcium: 8.6 mg/dL — ABNORMAL LOW (ref 8.9–10.3)
Chloride: 107 mmol/L (ref 98–111)
Creatinine, Ser: 0.69 mg/dL (ref 0.44–1.00)
GFR, Estimated: >60 mL/min (ref >60)
Glucose, Bld: 114 mg/dL — ABNORMAL HIGH (ref 70–99)
Potassium: 3.7 mmol/L (ref 3.5–5.1)
Sodium: 137 mmol/L (ref 135–145)
Total Bilirubin: 0.3 mg/dL (ref 0.3–1.2)
Total Protein: 6.9 g/dL (ref 6.5–8.1)

## 2022-08-29 LAB — CBC WITH DIFFERENTIAL/PLATELET
Abs Immature Granulocytes: 0 10*3/uL (ref 0.00–0.07)
Basophils Absolute: 0.1 10*3/uL (ref 0.0–0.1)
Basophils Relative: 1 %
Eosinophils Absolute: 0.3 10*3/uL (ref 0.0–0.5)
Eosinophils Relative: 2 %
HCT: 32.5 % — ABNORMAL LOW (ref 36.0–46.0)
Hemoglobin: 9.7 g/dL — ABNORMAL LOW (ref 12.0–15.0)
Lymphocytes Relative: 33 %
Lymphs Abs: 4.2 10*3/uL — ABNORMAL HIGH (ref 0.7–4.0)
MCH: 17.6 pg — ABNORMAL LOW (ref 26.0–34.0)
MCHC: 29.8 g/dL — ABNORMAL LOW (ref 30.0–36.0)
MCV: 58.9 fL — ABNORMAL LOW (ref 80.0–100.0)
Monocytes Absolute: 0.4 10*3/uL (ref 0.1–1.0)
Monocytes Relative: 3 %
Neutro Abs: 7.8 10*3/uL — ABNORMAL HIGH (ref 1.7–7.7)
Neutrophils Relative %: 61 %
Platelets: 311 10*3/uL (ref 150–400)
RBC: 5.52 MIL/uL — ABNORMAL HIGH (ref 3.87–5.11)
RDW: 19.4 % — ABNORMAL HIGH (ref 11.5–15.5)
WBC: 12.8 10*3/uL — ABNORMAL HIGH (ref 4.0–10.5)
nRBC: 0 % (ref 0.0–0.2)
nRBC: 0 /100 WBC

## 2022-08-29 LAB — CBC
HCT: 29.9 % — ABNORMAL LOW (ref 36.0–46.0)
Hemoglobin: 9.2 g/dL — ABNORMAL LOW (ref 12.0–15.0)
MCH: 17.6 pg — ABNORMAL LOW (ref 26.0–34.0)
MCHC: 30.8 g/dL (ref 30.0–36.0)
MCV: 57.2 fL — ABNORMAL LOW (ref 80.0–100.0)
Platelets: 299 10*3/uL (ref 150–400)
RBC: 5.23 MIL/uL — ABNORMAL HIGH (ref 3.87–5.11)
RDW: 18.9 % — ABNORMAL HIGH (ref 11.5–15.5)
WBC: 13.9 10*3/uL — ABNORMAL HIGH (ref 4.0–10.5)
nRBC: 0 % (ref 0.0–0.2)

## 2022-08-29 LAB — BASIC METABOLIC PANEL
Anion gap: 7 (ref 5–15)
BUN: 10 mg/dL (ref 6–20)
CO2: 24 mmol/L (ref 22–32)
Calcium: 8.7 mg/dL — ABNORMAL LOW (ref 8.9–10.3)
Chloride: 108 mmol/L (ref 98–111)
Creatinine, Ser: 0.69 mg/dL (ref 0.44–1.00)
GFR, Estimated: >60 mL/min (ref >60)
Glucose, Bld: 104 mg/dL — ABNORMAL HIGH (ref 70–99)
Potassium: 3.8 mmol/L (ref 3.5–5.1)
Sodium: 139 mmol/L (ref 135–145)

## 2022-08-29 LAB — APTT: aPTT: 31 seconds (ref 24–36)

## 2022-08-29 LAB — PROTIME-INR
INR: 1.1 (ref 0.8–1.2)
Prothrombin Time: 14.1 seconds (ref 11.4–15.2)

## 2022-08-29 LAB — HIV ANTIBODY (ROUTINE TESTING W REFLEX): HIV Screen 4th Generation wRfx: NONREACTIVE

## 2022-08-29 LAB — BRAIN NATRIURETIC PEPTIDE: B Natriuretic Peptide: 27.7 pg/mL (ref 0.0–100.0)

## 2022-08-29 MED ORDER — HYDROCOD POLI-CHLORPHE POLI ER 10-8 MG/5ML PO SUER: 5.0000 mL | Freq: Two times a day (BID) | ORAL | 0 refills | Status: DC | PRN

## 2022-08-29 MED ORDER — HYDROCOD POLI-CHLORPHE POLI ER 10-8 MG/5ML PO SUER
5.0000 mL | Freq: Two times a day (BID) | ORAL | Status: DC | PRN
  Administered 2022-08-29 – 2022-08-31 (×5): 5 mL via ORAL
  Filled 2022-08-29 (×6): qty 5

## 2022-08-29 MED ORDER — ALBUTEROL SULFATE (2.5 MG/3ML) 0.083% IN NEBU: 2.5000 mg | INHALATION_SOLUTION | RESPIRATORY_TRACT | Status: DC | PRN

## 2022-08-29 MED ORDER — ONDANSETRON HCL 4 MG/2ML IJ SOLN: 4.0000 mg | Freq: Four times a day (QID) | INTRAMUSCULAR | Status: DC | PRN

## 2022-08-29 MED ORDER — POLYETHYLENE GLYCOL 3350 17 G PO PACK: 17.0000 g | PACK | Freq: Every day | ORAL | Status: DC | PRN

## 2022-08-29 MED ORDER — CLONAZEPAM 0.25 MG PO TBDP
0.5000 mg | ORAL_TABLET | Freq: Once | ORAL | Status: AC | PRN
  Administered 2022-08-29: 0.5 mg via ORAL
  Filled 2022-08-29: qty 2

## 2022-08-29 MED ORDER — DOCUSATE SODIUM 100 MG PO CAPS: 100.0000 mg | ORAL_CAPSULE | Freq: Two times a day (BID) | ORAL | Status: DC | PRN

## 2022-08-29 MED ORDER — PANTOPRAZOLE SODIUM 40 MG PO TBEC
40.0000 mg | DELAYED_RELEASE_TABLET | Freq: Every day | ORAL | Status: DC
  Administered 2022-08-29 – 2022-08-31 (×2): 40 mg via ORAL
  Filled 2022-08-29 (×2): qty 1

## 2022-08-29 MED ORDER — ACETAMINOPHEN 325 MG PO TABS: 650.0000 mg | ORAL_TABLET | ORAL | Status: DC | PRN

## 2022-08-29 MED ORDER — IPRATROPIUM-ALBUTEROL 0.5-2.5 (3) MG/3ML IN SOLN
3.0000 mL | Freq: Four times a day (QID) | RESPIRATORY_TRACT | Status: DC
  Administered 2022-08-29 – 2022-08-30 (×2): 3 mL via RESPIRATORY_TRACT
  Filled 2022-08-29 (×2): qty 3

## 2022-08-29 NOTE — ED Provider Triage Note (Signed)
Emergency Medicine Provider Triage Evaluation Note  Julie King , a 32 y.o. female  was evaluated in triage.  Pt complains of shortness of breath and hemoptysis. Patient was just here yesterday for same and was found to have a lung mass on the left side. Plan was admission for bronchoscopy tomorrow however patient refused admission. She states that today she is feeling more short of breath and is continuing to cough up about 1 tablespoon of blood at a time. She called pulmonologist Dr. Lake Bells who told her to come here and he would admit her.   Review of Systems  Positive:  Negative:   Physical Exam  BP 127/84 (BP Location: Right Arm)   Pulse (!) 101   Temp 98.4 F (36.9 C) (Oral)   Resp (!) 22   Ht '5\' 7"'$  (1.702 m)   Wt 135.2 kg   SpO2 98%   BMI 46.67 kg/m  Gen:   Awake, no distress   Resp:  Normal effort  MSK:   Moves extremities without difficulty  Other:    Medical Decision Making  Medically screening exam initiated at 1:09 PM.  Appropriate orders placed.  Julie King was informed that the remainder of the evaluation will be completed by another provider, this initial triage assessment does not replace that evaluation, and the importance of remaining in the ED until their evaluation is complete.     Bud Face, PA-C 08/29/22 1313

## 2022-08-29 NOTE — ED Triage Notes (Signed)
Was here yesterday and dx with lung mass and coughing up blood and MD McQuaid wanted to admit her but she wouldn't stay.  Reports she contacted the doc and told her to come back and be admitted due to increase SOB and coughing up blood still

## 2022-08-29 NOTE — Care Plan (Signed)
Patient presenting with "terrible anxiety". Very tearful during conversation. Rates her anxiety 8/10. She states she normally would call her grandmother to pray but her grandma recently passed 2 weeks ago. When asked what is causing the anxiety she states "the procedure tomorrow and just everything". She asked RN if there is anything the doctor can order to help calm her anxieties. On-call notified of this (see new orders). RN provided emotional support and distracting techniques to help also.

## 2022-08-29 NOTE — Progress Notes (Signed)
Tussionex Rx sent for cough 78m q12h prn cough

## 2022-08-29 NOTE — H&P (Signed)
NAME:  Julie King, MRN:  [id number], DOB:  1990-11-15, LOS: 0 ADMISSION DATE:  08/29/2022, CONSULTATION DATE:  08/29/21 REFERRING MD:  Jeanell Sparrow, CHIEF COMPLAINT:  Hemoptysis   History of Present Illness:  32 year old female cigarette smoker presented to the [facility] emergency department on August 29, 2022 complaining of shortness of breath, cough and hemoptysis.  The patient had been seen here the day prior, see consult note for full details.  Briefly, she has been found to have a lingular lung mass in the last 6 to 12 months which was evaluated in a neighboring health system.  She underwent a navigational bronchoscopy in November 2023, pathology from that procedure was not diagnostic of malignancy.  Yesterday she came to the emergency department complaining of coughing up bloody sputum x 1, 1 week of chest tightness and shortness of breath.  Admission offered, she preferred to go home and come back on January 2 for bronchoscopy.  However, overnight she developed worsening cough, shortness of breath and is still coughing up scant amount of blood.  She requested admission for further evaluation.  No recent sick contacts, no fever in the interim since we saw her yesterday.  COVID and flu testing yesterday was negative. She is still smoking cigarettes. Has a history of A-fib, which she says only occurs sporadially, she does not take anticoagulation for it.  Pertinent  Medical History   Past Medical History:  Diagnosis Date   Atrial fibrillation [facility])    Family history of breast cancer    Headache 07/25/2017   Hypertension    Irritable bowel syndrome    Morbidly obese ([place]) 07/25/2017   QT prolongation    T wave inversion in EKG      Significant Hospital Events: Including procedures, antibiotic start and stop dates in addition to other pertinent events   June 25, 2022 PET CT scan hypermetabolic pulmonary nodule in lingula abutting major fissure, left hilum with increased uptake, SUV max  4.0, reactive inguinal lymph nodes, right ovarian cyst July 05, 2022 robotic bronchoscopy at [facility] August 28, 2022 CT angiogram chest: No pulmonary embolism, left lung mass measuring 3.4 cm, left upper lobe, spanning major fissure.  No pleural effusion.  Radiology notes no enlarged lymph nodes in mediastinum or hilum  Interim History / Subjective:  As above  Objective   Blood pressure 127/84, pulse (!) 101, temperature 98.4 F (36.9 C), temperature source Oral, resp. rate (!) 22, height '5\' 7"'$  (1.702 m), weight 135.2 kg, SpO2 98 %.       No intake or output data in the 24 hours ending 08/29/22 1424 Filed Weights   08/29/22 1300  Weight: 135.2 kg    Examination:  General:  Morbidly obese, resting comfortably in bed HENT: NCAT OP clear PULM: CTA B, normal effort CV: RRR, no mgr GI: BS+, soft, nontender MSK: normal bulk and tone Neuro: awake, alert, no distress, MAEW   Resolved Hospital Problem list     Assessment & Plan:  Left upper lobe lung mass, PET + June 25, 2022 Hemoptysis Cough Shortness of breath Cigarette smoker Paroxysmal atrial fibrillation  Discussion: Return to the hospital today complaining of shortness of breath again, lungs are clear on exam, vital signs within normal limits, still coughing up blood.  Picture worrisome that growing masses contributing to dyspnea.  She says that lung function testing in the past has been negative and she has been told she does not have asthma.  Suspect there may be some degree of  reactive airways disease or small airways disease related to smoking however.  Plan: Admit to MedSurg Two-view chest x-ray Twelve-lead EKG given history of A-fib Plan bronchoscopy on January 2 with Dr. Valeta Harms Scheduled DuoNeb As needed albuterol Tussionex as needed for cough Start PPI given high likelihood of gastroesophageal reflux disease related cough Counsel to quit smoking NPO at midnight, order written Consent orders written  for bronchoscopy  Best Practice (right click and "Reselect all SmartList Selections" daily)   Diet/type: Regular consistency (see orders) DVT prophylaxis: SCD GI prophylaxis: N/A Lines: N/A Foley:  N/A Code Status:  full code Last date of multidisciplinary goals of care discussion [1/1]  Labs   CBC: Recent Labs  Lab 08/28/22 1032 08/29/22 1314  WBC 11.7* 12.8*  NEUTROABS  --  7.8*  HGB 10.2* 9.7*  HCT 33.5* 32.5*  MCV 57.9* 58.9*  PLT 310 371    Basic Metabolic Panel: Recent Labs  Lab 08/28/22 1032 08/29/22 1314  NA 140 139  K 4.2 3.8  CL 107 108  CO2 25 24  GLUCOSE 99 104*  BUN 8 10  CREATININE 0.71 0.69  CALCIUM 8.8* 8.7*   GFR: Estimated Creatinine Clearance: 146.4 mL/min (by C-G formula based on SCr of 0.69 mg/dL). Recent Labs  Lab 08/28/22 1032 08/29/22 1314  WBC 11.7* 12.8*    Liver Function Tests: No results for input(s): "AST", "ALT", "ALKPHOS", "BILITOT", "PROT", "ALBUMIN" in the last 168 hours. No results for input(s): "LIPASE", "AMYLASE" in the last 168 hours. No results for input(s): "AMMONIA" in the last 168 hours.  ABG    Component Value Date/Time   TCO2 25 06/28/2021 1106     Coagulation Profile: No results for input(s): "INR", "PROTIME" in the last 168 hours.  Cardiac Enzymes: No results for input(s): "CKTOTAL", "CKMB", "CKMBINDEX", "TROPONINI" in the last 168 hours.  HbA1C: No results found for: "HGBA1C"  CBG: No results for input(s): "GLUCAP" in the last 168 hours.  Review of Systems:   Gen: Denies fever, chills, weight change, fatigue, night sweats HEENT: Denies blurred vision, double vision, hearing loss, tinnitus, sinus congestion, rhinorrhea, sore throat, neck stiffness, dysphagia PULM: per HPI CV: Denies chest pain, edema, orthopnea, paroxysmal nocturnal dyspnea, palpitations GI: Denies abdominal pain, nausea, vomiting, diarrhea, hematochezia, melena, constipation, change in bowel habits GU: Denies dysuria,  hematuria, polyuria, oliguria, urethral discharge Endocrine: Denies hot or cold intolerance, polyuria, polyphagia or appetite change Derm: Denies rash, dry skin, scaling or peeling skin change Heme: Denies easy bruising, bleeding, bleeding gums Neuro: Denies headache, numbness, weakness, slurred speech, loss of memory or consciousness   Past Medical History:  She,  has a past medical history of Atrial fibrillation ([place]), Family history of breast cancer, Headache (07/25/2017), Hypertension, Irritable bowel syndrome, Morbidly obese ([place]) (07/25/2017), QT prolongation, and T wave inversion in EKG.   Surgical History:   Past Surgical History:  Procedure Laterality Date   CESAREAN SECTION     CESAREAN SECTION N/A 01/31/2020   Procedure: CESAREAN SECTION;  Surgeon: Chancy Milroy, MD;  Location: [facility] LD ORS;  Service: Obstetrics;  Laterality: N/A;   CHOLECYSTECTOMY     HERNIA REPAIR     TONSILLECTOMY       Social History:   reports that she has been smoking cigarettes. She has a 5.00 pack-year smoking history. She has never used smokeless tobacco. She reports current alcohol use. She reports that she does not use drugs.   Family History:  Her family history includes Breast cancer in her paternal  grandmother; Breast cancer (age of onset: 73) in her maternal aunt; Breast cancer (age of onset: 52) in an other family member; Crohn's disease in her mother; Diabetes in her father and another family member; Hypertension in her mother and another family member; Thyroid disease in her father.   Allergies Allergies  Allergen Reactions   Amoxicillin Itching    Pt states she can take   Percocet [Oxycodone-Acetaminophen] Hives, Itching and Nausea Only   Pineapple Itching    Fresh pineapples; pineapples from a can is okay     Home Medications  Prior to Admission medications   Medication Sig Start Date End Date Taking? Authorizing Provider  albuterol (VENTOLIN HFA) 108 (90 Base) MCG/ACT inhaler  Inhale 2 puffs into the lungs every 6 (six) hours as needed for wheezing or shortness of breath (Cough). 06/14/22   Lynden Oxford Scales, PA-C  budesonide-formoterol (SYMBICORT) 160-4.5 MCG/ACT inhaler Inhale 2 puffs into the lungs every 12 (twelve) hours. Patient not taking: Reported on 08/29/2022 06/14/22 12/11/22  Lynden Oxford Scales, PA-C  cetirizine (ZYRTEC ALLERGY) 10 MG tablet Take 1 tablet (10 mg total) by mouth at bedtime. Patient not taking: Reported on 08/29/2022 06/14/22 12/11/22  Lynden Oxford Scales, PA-C  chlorpheniramine-HYDROcodone (TUSSIONEX) 10-8 MG/5ML Take 5 mLs by mouth every 12 (twelve) hours as needed for cough. Patient not taking: Reported on 08/29/2022 08/29/22   Juanito Doom, MD  fluticasone [facility]) 50 MCG/ACT nasal spray Place 1 spray into both nostrils daily. Patient not taking: Reported on 08/29/2022 06/14/22   Lynden Oxford Scales, PA-C  guaifenesin (HUMIBID E) 400 MG TABS tablet Take 1 tablet 3 times daily as needed for chest congestion and cough Patient not taking: Reported on 08/29/2022 06/14/22   Lynden Oxford Scales, PA-C  ibuprofen (ADVIL) 600 MG tablet Take 1 tablet (600 mg total) by mouth every 8 (eight) hours as needed. Patient not taking: Reported on 08/29/2022 05/18/22   Mar Daring, PA-C  ipratropium (ATROVENT) 0.06 % nasal spray Place 2 sprays into both nostrils 3 (three) times daily. As needed for nasal congestion, runny nose Patient not taking: Reported on 08/29/2022 06/14/22   Lynden Oxford Scales, PA-C  montelukast (SINGULAIR) 10 MG tablet Take 1 tablet (10 mg total) by mouth at bedtime. Patient not taking: Reported on 08/29/2022 06/14/22 12/11/22  Lynden Oxford Scales, PA-C     Critical care time: n/a    Roselie Awkward, MD [place] [facility] Pager: [phone] Cell: [phone] After 7:00 pm call Elink  [phone]

## 2022-08-30 ENCOUNTER — Inpatient Hospital Stay (HOSPITAL_COMMUNITY): Payer: Medicaid Other

## 2022-08-30 ENCOUNTER — Inpatient Hospital Stay (HOSPITAL_COMMUNITY): Payer: Medicaid Other | Admitting: Anesthesiology

## 2022-08-30 ENCOUNTER — Ambulatory Visit (HOSPITAL_COMMUNITY): Admit: 2022-08-30 | Payer: Medicaid Other | Admitting: Pulmonary Disease

## 2022-08-30 ENCOUNTER — Encounter (HOSPITAL_COMMUNITY): Payer: Self-pay | Admitting: Pulmonary Disease

## 2022-08-30 ENCOUNTER — Encounter (HOSPITAL_COMMUNITY)
Admission: EM | Disposition: A | Discharge status: Home / Self Care | Payer: Self-pay | Source: Home / Self Care | Attending: Pulmonary Disease

## 2022-08-30 DIAGNOSIS — R042 Hemoptysis: Secondary | ICD-10-CM | POA: Diagnosis not present

## 2022-08-30 DIAGNOSIS — I10 Essential (primary) hypertension: Secondary | ICD-10-CM

## 2022-08-30 DIAGNOSIS — R918 Other nonspecific abnormal finding of lung field: Secondary | ICD-10-CM | POA: Insufficient documentation

## 2022-08-30 DIAGNOSIS — I4891 Unspecified atrial fibrillation: Secondary | ICD-10-CM

## 2022-08-30 DIAGNOSIS — F17210 Nicotine dependence, cigarettes, uncomplicated: Secondary | ICD-10-CM

## 2022-08-30 HISTORY — PX: BRONCHIAL BRUSHINGS: SHX5108

## 2022-08-30 HISTORY — PX: BRONCHIAL NEEDLE ASPIRATION BIOPSY: SHX5106

## 2022-08-30 HISTORY — PX: BRONCHIAL BIOPSY: SHX5109

## 2022-08-30 SURGERY — BRONCHOSCOPY, WITH BIOPSY USING ELECTROMAGNETIC NAVIGATION
Anesthesia: General | Laterality: Bilateral

## 2022-08-30 MED ORDER — DEXTROMETHORPHAN POLISTIREX ER 30 MG/5ML PO SUER
15.0000 mg | Freq: Every evening | ORAL | Status: DC | PRN
  Administered 2022-08-30 – 2022-09-01 (×3): 15 mg via ORAL
  Filled 2022-08-30 (×4): qty 5

## 2022-08-30 MED ORDER — MIDAZOLAM HCL 2 MG/2ML IJ SOLN
INTRAMUSCULAR | Status: DC | PRN
  Administered 2022-08-30: 2 mg via INTRAVENOUS

## 2022-08-30 MED ORDER — HYDROXYZINE HCL 10 MG PO TABS
10.0000 mg | ORAL_TABLET | Freq: Once | ORAL | Status: AC | PRN
  Administered 2022-08-30: 10 mg via ORAL
  Filled 2022-08-30: qty 1

## 2022-08-30 MED ORDER — PROPOFOL 1000 MG/100ML IV EMUL
INTRAVENOUS | Status: AC
  Filled 2022-08-30: qty 100

## 2022-08-30 MED ORDER — PROPOFOL 10 MG/ML IV BOLUS
INTRAVENOUS | Status: DC | PRN
  Administered 2022-08-30: 200 mg via INTRAVENOUS

## 2022-08-30 MED ORDER — LACTATED RINGERS IV SOLN: INTRAVENOUS | Status: DC | PRN

## 2022-08-30 MED ORDER — DEXAMETHASONE SODIUM PHOSPHATE 10 MG/ML IJ SOLN
INTRAMUSCULAR | Status: DC | PRN
  Administered 2022-08-30: 10 mg via INTRAVENOUS

## 2022-08-30 MED ORDER — LACTATED RINGERS IV SOLN: INTRAVENOUS | Status: DC

## 2022-08-30 MED ORDER — SUGAMMADEX SODIUM 200 MG/2ML IV SOLN
INTRAVENOUS | Status: DC | PRN
  Administered 2022-08-30: 400 mg via INTRAVENOUS

## 2022-08-30 MED ORDER — FENTANYL CITRATE (PF) 100 MCG/2ML IJ SOLN
INTRAMUSCULAR | Status: DC | PRN
  Administered 2022-08-30 (×2): 50 ug via INTRAVENOUS

## 2022-08-30 MED ORDER — PROPOFOL 500 MG/50ML IV EMUL
INTRAVENOUS | Status: DC | PRN
  Administered 2022-08-30: 150 ug/kg/min via INTRAVENOUS

## 2022-08-30 MED ORDER — ROCURONIUM BROMIDE 10 MG/ML (PF) SYRINGE
PREFILLED_SYRINGE | INTRAVENOUS | Status: DC | PRN
  Administered 2022-08-30: 60 mg via INTRAVENOUS

## 2022-08-30 MED ORDER — CHLORHEXIDINE GLUCONATE 0.12 % MT SOLN
OROMUCOSAL | Status: AC
  Filled 2022-08-30: qty 15

## 2022-08-30 MED ORDER — FENTANYL CITRATE (PF) 250 MCG/5ML IJ SOLN: INTRAMUSCULAR | Status: DC | PRN

## 2022-08-30 MED ORDER — LIDOCAINE 2% (20 MG/ML) 5 ML SYRINGE
INTRAMUSCULAR | Status: DC | PRN
  Administered 2022-08-30: 50 mg via INTRAVENOUS

## 2022-08-30 MED ORDER — ALBUTEROL SULFATE HFA 108 (90 BASE) MCG/ACT IN AERS
INHALATION_SPRAY | RESPIRATORY_TRACT | Status: DC | PRN
  Administered 2022-08-30: 4 via RESPIRATORY_TRACT

## 2022-08-30 MED ORDER — ONDANSETRON HCL 4 MG/2ML IJ SOLN
INTRAMUSCULAR | Status: DC | PRN
  Administered 2022-08-30: 4 mg via INTRAVENOUS

## 2022-08-30 MED ORDER — SUCCINYLCHOLINE CHLORIDE 200 MG/10ML IV SOSY
PREFILLED_SYRINGE | INTRAVENOUS | Status: DC | PRN
  Administered 2022-08-30: 100 mg via INTRAVENOUS

## 2022-08-30 NOTE — Telephone Encounter (Signed)
I see that patient was admitted yesterday and is still in the hospital.  Looks like Dr Valeta Harms cancelled the pre-admit order.  Just want to make sure I don't need to do anything with this.

## 2022-08-30 NOTE — Progress Notes (Signed)
Report was called and given to [facility] in short stay.

## 2022-08-30 NOTE — Telephone Encounter (Signed)
Verified with BI pt has been readmitted and this will not be done.

## 2022-08-30 NOTE — Progress Notes (Signed)
Patient returned to the unit vitals are stable at this time

## 2022-08-30 NOTE — Transfer of Care (Signed)
Immediate Anesthesia Transfer of Care Note  Patient: [name]  Procedure(s) Performed: ROBOTIC ASSISTED NAVIGATIONAL BRONCHOSCOPY (Bilateral) BRONCHIAL BIOPSIES BRONCHIAL NEEDLE ASPIRATION BIOPSIES BRONCHIAL BRUSHINGS  Patient Location: PACU  Anesthesia Type:General  Level of Consciousness: drowsy and patient cooperative  Airway & Oxygen Therapy: Patient Spontanous Breathing and Patient connected to face mask oxygen  Post-op Assessment: Report given to RN, Post -op Vital signs reviewed and stable, and Patient moving all extremities X 4  Post vital signs: Reviewed and stable  Last Vitals:  Vitals Value Taken Time  BP 154/89 08/30/22 1555  Temp    Pulse 101 08/30/22 1555  Resp 20 08/30/22 1555  SpO2 100 % 08/30/22 1555  Vitals shown include unvalidated device data.  Last Pain:  Vitals:   08/30/22 1100  TempSrc:   PainSc: 3       Patients Stated Pain Goal: 0 (61/96/94 0982)  Complications: No notable events documented.

## 2022-08-30 NOTE — Anesthesia Procedure Notes (Signed)
Procedure Name: Intubation Date/Time: 08/30/2022 2:46 PM  Performed by: Mariea Clonts, CRNAPre-anesthesia Checklist: Patient identified, Emergency Drugs available, Suction available and Patient being monitored Patient Re-evaluated:Patient Re-evaluated prior to induction Oxygen Delivery Method: Circle System Utilized Preoxygenation: Pre-oxygenation with 100% oxygen Induction Type: IV induction Ventilation: Mask ventilation without difficulty and Oral airway inserted - appropriate to patient size Laryngoscope Size: Sabra Heck and 2 Grade View: Grade II Tube type: Oral Tube size: 8.5 mm Number of attempts: 2 Airway Equipment and Method: Stylet and Oral airway Placement Confirmation: ETT inserted through vocal cords under direct vision, positive ETCO2 and breath sounds checked- equal and bilateral Tube secured with: Tape Dental Injury: Teeth and Oropharynx as per pre-operative assessment

## 2022-08-30 NOTE — Discharge Instructions (Signed)
Flexible Bronchoscopy, Care After This sheet gives you information about how to care for yourself after your test. Your doctor may also give you more specific instructions. If you have problems or questions, contact your doctor. Follow these instructions at home: Eating and drinking Do not eat or drink anything (not even water) for 2 hours after your test, or until your numbing medicine (local anesthetic) wears off. When your numbness is gone and your cough and gag reflexes have come back, you may: Eat only soft foods. Slowly drink liquids. The day after the test, go back to your normal diet. Driving Do not drive for 24 hours if you were given a medicine to help you relax (sedative). Do not drive or use heavy machinery while taking prescription pain medicine. General instructions  Take over-the-counter and prescription medicines only as told by your doctor. Return to your normal activities as told. Ask what activities are safe for you. Do not use any products that have nicotine or tobacco in them. This includes cigarettes and e-cigarettes. If you need help quitting, ask your doctor. Keep all follow-up visits as told by your doctor. This is important. It is very important if you had a tissue sample (biopsy) taken. Get help right away if: You have shortness of breath that gets worse. You get light-headed. You feel like you are going to pass out (faint). You have chest pain. You cough up: More than a little blood. More blood than before. Summary Do not eat or drink anything (not even water) for 2 hours after your test, or until your numbing medicine wears off. Do not use cigarettes. Do not use e-cigarettes. Get help right away if you have chest pain.  This information is not intended to replace advice given to you by your health care provider. Make sure you discuss any questions you have with your health care provider. Document Released: 06/12/2009 Document Revised: 07/28/2017 Document  Reviewed: 09/02/2016 Elsevier Patient Education  2020 [employer].

## 2022-08-30 NOTE — Op Note (Signed)
Video Bronchoscopy with Robotic Assisted Bronchoscopic Navigation   Date of Operation: 08/30/2022   Pre-op Diagnosis: Left upper lobe mass  Post-op Diagnosis: Left upper lobe mass  Surgeon: Garner Nash, DO  Assistants: None   Anesthesia: General endotracheal anesthesia  Operation: Flexible video fiberoptic bronchoscopy with robotic assistance and biopsies.  Estimated Blood Loss: Minimal  Complications: None  Indications and History: Julie King is a 32 y.o. female with history of LUL mass. The risks, benefits, complications, treatment options and expected outcomes were discussed with the patient.  The possibilities of pneumothorax, pneumonia, reaction to medication, pulmonary aspiration, perforation of a viscus, bleeding, failure to diagnose a condition and creating a complication requiring transfusion or operation were discussed with the patient who freely signed the consent.    Description of Procedure: The patient was seen in the Preoperative Area, was examined and was deemed appropriate to proceed.  The patient was taken to [facility] endoscopy room 3, identified as Julie King and the procedure verified as Flexible Video Fiberoptic Bronchoscopy.  A Time Out was held and the above information confirmed.   Prior to the date of the procedure a high-resolution CT scan of the chest was performed. Utilizing ION software program a virtual tracheobronchial tree was generated to allow the creation of distinct navigation pathways to the patient's parenchymal abnormalities. After being taken to the operating room general anesthesia was initiated and the patient  was orally intubated. The video fiberoptic bronchoscope was introduced via the endotracheal tube and a general inspection was performed which showed normal right and left lung anatomy, aspiration of the bilateral mainstems was completed to remove any remaining secretions. Robotic catheter inserted into patient's endotracheal tube.    Target #1 left upper lobe mass: The distinct navigation pathways prepared prior to this procedure were then utilized to navigate to patient's lesion identified on CT scan. The robotic catheter was secured into place and the vision probe was withdrawn.  Lesion location was approximated using fluoroscopy and three-dimensional cone beam CT imaging for peripheral targeting. Under fluoroscopic guidance transbronchial needle brushings, transbronchial needle biopsies, and transbronchial forceps biopsies were performed to be sent for cytology and pathology.  Additional transbronchial biopsies sent for tissue culture and microbiology evaluation  At the end of the procedure a general airway inspection was performed and there was no evidence of active bleeding. The bronchoscope was removed.  The patient tolerated the procedure well. There was no significant blood loss and there were no obvious complications. A post-procedural chest x-ray is pending.  Samples Target #1: 1. Transbronchial needle brushings from left upper lobe 2. Transbronchial Wang needle biopsies from left upper lobe 3. Transbronchial forceps biopsies from left upper lobe  Plans:  The patient will be discharged from the PACU to home when recovered from anesthesia and after chest x-ray is reviewed. We will review the cytology, pathology and microbiology results with the patient when they become available. Outpatient followup will be with Garner Nash, DO.  Garner Nash, DO [facility] 08/30/2022 3:35 PM

## 2022-08-30 NOTE — Interval H&P Note (Signed)
History and Physical Interval Note:  08/30/2022 1:57 PM  Julie King  has presented today for surgery, with the diagnosis of lung mass.  The various methods of treatment have been discussed with the patient and family. After consideration of risks, benefits and other options for treatment, the patient has consented to  Procedure(s): ROBOTIC ASSISTED NAVIGATIONAL BRONCHOSCOPY (Bilateral) as a surgical intervention.  The patient's history has been reviewed, patient examined, no change in status, stable for surgery.  I have reviewed the patient's chart and labs.  Questions were answered to the patient's satisfaction.     [place]

## 2022-08-30 NOTE — Anesthesia Postprocedure Evaluation (Signed)
Anesthesia Post Note  Patient: Julie King  Procedure(s) Performed: ROBOTIC ASSISTED NAVIGATIONAL BRONCHOSCOPY (Bilateral) BRONCHIAL BIOPSIES BRONCHIAL NEEDLE ASPIRATION BIOPSIES BRONCHIAL BRUSHINGS     Patient location during evaluation: PACU Anesthesia Type: General Level of consciousness: awake and alert Pain management: pain level controlled Vital Signs Assessment: post-procedure vital signs reviewed and stable Respiratory status: spontaneous breathing, nonlabored ventilation, respiratory function stable and patient connected to nasal cannula oxygen Cardiovascular status: blood pressure returned to baseline and stable Postop Assessment: no apparent nausea or vomiting Anesthetic complications: no   No notable events documented.  Last Vitals:  Vitals:   08/30/22 1651 08/30/22 1655  BP: (!) 132/90   Pulse: 92 85  Resp: 13 20  Temp:    SpO2: 98% 94%          Effie Berkshire

## 2022-08-30 NOTE — Progress Notes (Signed)
[place] Progress Note Patient Name: Julie King DOB: 25-Aug-1991 MRN: [id number]   Date of Service  08/30/2022  HPI/Events of Note  RN reports pt has received tussionex & is getting no relief.    eICU Interventions  Given allergy history and episode of anxiety ordered Atarax Also ordered dextromethorphan prn     Intervention Category Intermediate Interventions: Other:  Judd Lien 08/30/2022, 12:12 AM

## 2022-08-30 NOTE — Anesthesia Preprocedure Evaluation (Addendum)
Anesthesia Evaluation  Patient identified by MRN, date of birth, ID band Patient awake    Reviewed: Allergy & Precautions, NPO status , Patient's Chart, lab work & pertinent test results  Airway Mallampati: I  TM Distance: >3 FB Neck ROM: Full    Dental  (+) Teeth Intact   Pulmonary Current Smoker and Patient abstained from smoking.   Pulmonary exam normal        Cardiovascular hypertension, + dysrhythmias Atrial Fibrillation  Rhythm:Regular Rate:Normal     Neuro/Psych negative neurological ROS  negative psych ROS   GI/Hepatic negative GI ROS, Neg liver ROS,,,  Endo/Other  negative endocrine ROS    Renal/GU negative Renal ROS     Musculoskeletal   Abdominal   Peds  Hematology negative hematology ROS (+)   Anesthesia Other Findings   Reproductive/Obstetrics                              Anesthesia Physical Anesthesia Plan  ASA: 3  Anesthesia Plan: General   Post-op Pain Management: Minimal or no pain anticipated   Induction: Intravenous  PONV Risk Score and Plan: 0 and TIVA and Propofol infusion  Airway Management Planned: Oral ETT  Additional Equipment: None  Intra-op Plan:   Post-operative Plan: Extubation in OR  Informed Consent: I have reviewed the patients History and Physical, chart, labs and discussed the procedure including the risks, benefits and alternatives for the proposed anesthesia with the patient or authorized representative who has indicated his/her understanding and acceptance.     Dental advisory given  Plan Discussed with: CRNA  Anesthesia Plan Comments:         Anesthesia Quick Evaluation

## 2022-08-31 ENCOUNTER — Encounter (HOSPITAL_COMMUNITY): Payer: Self-pay | Admitting: Pulmonary Disease

## 2022-08-31 ENCOUNTER — Other Ambulatory Visit: Payer: Self-pay | Admitting: *Deleted

## 2022-08-31 ENCOUNTER — Inpatient Hospital Stay (HOSPITAL_COMMUNITY): Payer: Medicaid Other

## 2022-08-31 ENCOUNTER — Encounter: Payer: Self-pay | Admitting: General Practice

## 2022-08-31 DIAGNOSIS — R042 Hemoptysis: Secondary | ICD-10-CM | POA: Diagnosis not present

## 2022-08-31 DIAGNOSIS — R918 Other nonspecific abnormal finding of lung field: Secondary | ICD-10-CM | POA: Diagnosis not present

## 2022-08-31 LAB — GLUCOSE, CAPILLARY: Glucose-Capillary: 170 mg/dL — ABNORMAL HIGH (ref 70–99)

## 2022-08-31 MED ORDER — ALBUTEROL SULFATE (2.5 MG/3ML) 0.083% IN NEBU
2.5000 mg | INHALATION_SOLUTION | RESPIRATORY_TRACT | Status: DC | PRN
  Administered 2022-08-31 (×2): 2.5 mg via RESPIRATORY_TRACT
  Filled 2022-08-31 (×2): qty 3

## 2022-08-31 MED ORDER — BENZONATATE 100 MG PO CAPS
200.0000 mg | ORAL_CAPSULE | Freq: Two times a day (BID) | ORAL | Status: DC
  Administered 2022-08-31: 200 mg via ORAL
  Filled 2022-08-31: qty 2

## 2022-08-31 MED ORDER — BENZONATATE 100 MG PO CAPS
200.0000 mg | ORAL_CAPSULE | Freq: Three times a day (TID) | ORAL | Status: DC
  Administered 2022-08-31 – 2022-09-01 (×2): 200 mg via ORAL
  Filled 2022-08-31 (×3): qty 2

## 2022-08-31 MED ORDER — IPRATROPIUM-ALBUTEROL 0.5-2.5 (3) MG/3ML IN SOLN
3.0000 mL | Freq: Four times a day (QID) | RESPIRATORY_TRACT | Status: DC
  Administered 2022-08-31 – 2022-09-01 (×3): 3 mL via RESPIRATORY_TRACT
  Filled 2022-08-31 (×3): qty 3

## 2022-08-31 MED ORDER — MOMETASONE FURO-FORMOTEROL FUM 200-5 MCG/ACT IN AERO
2.0000 | INHALATION_SPRAY | Freq: Two times a day (BID) | RESPIRATORY_TRACT | Status: DC
  Filled 2022-08-31: qty 8.8

## 2022-08-31 MED ORDER — MOMETASONE FURO-FORMOTEROL FUM 200-5 MCG/ACT IN AERO
2.0000 | INHALATION_SPRAY | Freq: Two times a day (BID) | RESPIRATORY_TRACT | Status: DC
  Administered 2022-08-31 – 2022-09-01 (×2): 2 via RESPIRATORY_TRACT
  Filled 2022-08-31: qty 8.8

## 2022-08-31 NOTE — Progress Notes (Signed)
[place] Progress Note Patient Name: YOLAND SCHERR DOB: 07-17-1991 MRN: [id number]   Date of Service  08/31/2022  HPI/Events of Note  Nursing request for PRN breathing treatment.   eICU Interventions  Plan: Albuterol 2.5 mg via neb Q 3 hours PRN SOB or wheezing.      Intervention Category Major Interventions: Other:  Lysle Dingwall 08/31/2022, 3:18 AM

## 2022-08-31 NOTE — Progress Notes (Signed)
Julie Garfinkel, MD  [facility] [address] [place], Vilinda Blanks, LPN; Garner Nash, DO Please arrange follow-up in the next week with either Dr. Valeta Harms or one of the APPs for hospital follow-up  Order PET scan, PFTs and referral to Dr. Roxan Hockey, cardiothoracic surgery.  Thanks Praveen   Orders placed for PFT, PET, and cardiothoracic surgery.

## 2022-08-31 NOTE — Progress Notes (Signed)
Received a call from a pt advocate from [place], [place] who was concerned about the pt. The advocate is lung cancer survivor, and connected with the patient through a message board. The advocate called the [facility] in [place] who suggested the pt see Dr.Mohamed. The advocate provided  me the name and phone number of the pt and I told her I'd get in touch with her soon.  1:15 I received the 1st phone call shortly from the pt, who is currently inpatient, wanted to talk about what we can do for her. She gave me the history of her current situation. She claims she was told by the pulmonologist that they weren't going to "dismiss her" until this (the mass) is out. She had a repeat bronchoscopy on 1/2 and cytologist results are still pending. She had her first bronch in Nov, but results were negative. She does not want to be discharged. She very stressed and she said normally she would call her grandmother and they would pray together, but her grandmother passed away a couple weeks ago.  1:45-Pt called me back as CT surgery had just met with her. It was not clear that they would be doing surgery. Pt is still concerned about being discharged. I explained at this time, we have to wait for the results of the 2nd biopsy, as we don't have an official cancer diagnosis yet. I offered to have our Chaplain, Lorrin Jackson, contact her and they can engage in a spiritual discuss that may help her feel better. Pt verbalized appreciate and said I could give Lattie Haw her contact information.

## 2022-08-31 NOTE — Progress Notes (Signed)
Mobility Specialist Progress Note:   08/31/22 1230  Mobility  Activity Ambulated with assistance in hallway  Level of Assistance Contact guard assist, steadying assist  Assistive Device None  Distance Ambulated (ft) 150 ft  Activity Response Tolerated fair  Mobility Referral Yes  $Mobility charge 1 Mobility   Pt received in bed and agreeable. C/o SOB from coughing fits. Pt left sitting EOB with all needs met and call bell in reach.   Andrey Campanile Mobility Specialist Please contact via SecureChat or  Rehab office at [phone]

## 2022-08-31 NOTE — Progress Notes (Signed)
[facility] Spiritual Care Note  Referred by thoracic navigator at [facility], Jinny Blossom Bouchard/RN, for spiritual and emotional support while Julie King is waiting for more information about her diagnosis and treatment plan.  Reached [name] by phone, building rapport and providing empathic listening, spiritual and emotional support, and prayer. She was very receptive to support and shared that her grandmother, who used to pray with her, died last month, so she is coping with fresh grief, as well.  We plan to follow up by phone on Friday for another check-in, and [name] has direct [facility] number, as well.   [place], [place], [facility] Pager [phone] Voicemail [phone]

## 2022-08-31 NOTE — Progress Notes (Signed)
      [address]       [place] [address]             [phone]      Asked to see Ms. Dade by Dr. Vaughan Browner  32 yo woman with a history of tobacco abuse, obesity, paroxysmal atrial fibrillation and hypertension.  Presented with hemoptysis and shortness of breath.  She had a known lingular mass that was being followed by [facility].    CT on admission showed increased size of mass.  PET at [facility] showed the mass was hypermetabolic. There was a hypermetabolic LN in the hilum with an SUV of 2.8.  I am not able to review films.  Bronch x 2 with no definitive evidence of cancer- of course has not been ruled out completely.  She did have PFTs which show adequate reserve to tolerate a resection.  Differential diagnosis includes primary bronchogenic cancer and infectious/ inflammatory nodules.  Her CT was not read as showing adenopathy but she has generous hilar nodes bilaterally and in the subcarinal space to my review.  I think a repeat PET would be helpful, but at a minimum I need to be able to review the PET images from her previous exam.  Still awaiting final results from [place].  There is no reason to rush into the wrong procedure, but does need expeditious workup and appropriate treatment, once it is determined what that is.  Revonda Standard Roxan Hockey, MD [facility] [phone]

## 2022-08-31 NOTE — Progress Notes (Signed)
PCCM interval progress note:  Asked to evaluate pt overnight for confusion.  RN reports she just seems off from baseline and is thinking she needs to go to work.  Pt is awake and alert and following commands, is complaining of light-headedness and feeling "weird" strength equal throughout, no facial droop or dysarthria, no pronator drift and pupils equal, no sensation deficit.  Has been getting Tussionex, otherwise no medications that would alter mentation. VSS.  She is mildly confused to situation.  -will get head CT as she is here with lung mass and no prior head imaging to ensure no mets, though low suspicion -basic labs -continue to monitor   Otilio Carpen Nasya Vincent, PA-C [facility] See Amion for pager If no response to pager , please call [phone] until 7pm After 7:00 pm call Elink  [phone]

## 2022-08-31 NOTE — Care Plan (Signed)
RN was called to patient room. She stated to RN "I want to get in bed" as she is sitting in her bed. Full set of vitals obtained and stable. Blood sugar 170. She states she "feels weird and lightheaded". Kept asking RN to get in bed. Neuro assessment WNL, A&Ox4. Called pulmonary critical care at 2229- PA Gleason arrived to bedside to evaluate patient at 2245. Plan to draw labs and complete head CT. Patient resting in bed, alarms active.

## 2022-08-31 NOTE — Progress Notes (Signed)
Patient at rest saturated at 100% on RA. Patient ambulated down the hall on RA saturating at 98%. During ambulation, patient reporting of SOB and coughing. Patient returned back to bed saturating at 100%. Dr. Vaughan Browner made aware

## 2022-08-31 NOTE — Progress Notes (Addendum)
NAME:  Julie King, MRN:  [id number], DOB:  Nov 16, 1990, LOS: 2 ADMISSION DATE:  08/29/2022, CONSULTATION DATE:  08/29/21 REFERRING MD:  Jeanell Sparrow, CHIEF COMPLAINT:  Hemoptysis   History of Present Illness:  32 year old female cigarette smoker presented to the [facility] emergency department on August 29, 2022 complaining of shortness of breath, cough and hemoptysis.  The patient had been seen here the day prior, see consult note for full details.  Briefly, she has been found to have a lingular lung mass in the last 6 to 12 months which was evaluated in a neighboring health system.  She underwent a navigational bronchoscopy in November 2023, pathology from that procedure was not diagnostic of malignancy.  Yesterday she came to the emergency department complaining of coughing up bloody sputum x 1, 1 week of chest tightness and shortness of breath.  Admission offered, she preferred to go home and come back on January 2 for bronchoscopy.  However, overnight she developed worsening cough, shortness of breath and is still coughing up scant amount of blood.  She requested admission for further evaluation.  No recent sick contacts, no fever in the interim since we saw her yesterday.  COVID and flu testing yesterday was negative. She is still smoking cigarettes. Has a history of A-fib, which she says only occurs sporadially, she does not take anticoagulation for it.  Pertinent  Medical History   Past Medical History:  Diagnosis Date   Atrial fibrillation ([place]) 2020   Family history of breast cancer    Headache 07/25/2017   Hypertension    Irritable bowel syndrome    Morbidly obese ([place]) 07/25/2017   QT prolongation    T wave inversion in EKG      Significant Hospital Events: Including procedures, antibiotic start and stop dates in addition to other pertinent events   June 25, 2022 PET CT scan hypermetabolic pulmonary nodule in lingula abutting major fissure, left hilum with increased uptake, SUV  max 4.0, reactive inguinal lymph nodes, right ovarian cyst July 05, 2022 robotic bronchoscopy at [facility] August 28, 2022 CT angiogram chest: No pulmonary embolism, left lung mass measuring 3.4 cm, left upper lobe, spanning major fissure.  No pleural effusion.  Radiology notes no enlarged lymph nodes in mediastinum or hilum 08/30/2022 status post bronchoscopy  Interim History / Subjective:  As above  Objective   Blood pressure (!) 151/89, pulse 87, temperature 98.2 F (36.8 C), temperature source Oral, resp. rate 18, height '5\' 7"'$  (1.702 m), weight 135.2 kg, last menstrual period 08/29/2022, SpO2 100 %.        Intake/Output Summary (Last 24 hours) at 08/31/2022 0956 Last data filed at 08/30/2022 1539 Gross per 24 hour  Intake 600 ml  Output 1 ml  Net 599 ml   Filed Weights   08/29/22 1300  Weight: 135.2 kg    Examination:  General: Morbid obese female no acute distress HEENT: MM pink/moist no JVD is appreciated Neuro: No deficits noted CV: Heart sounds are regular PULM: Decreased breath sounds in the bases, close of cough without sputum production at this time although she reports hemoptysis  GI: soft, bsx4 active obese GU: Voids  extremities: warm/dry, 1+ edema  Skin: no rashes or lesions    Resolved Hospital Problem list     Assessment & Plan:  Left upper lobe lung mass, PET + June 25, 2022 Hemoptysis Cough Shortness of breath Cigarette smoker Paroxysmal atrial fibrillation  Discussion: Return to the hospital 08/29/22 complaining of shortness of breath  again, lungs are clear on exam, vital signs within normal limits, still coughing up blood.  Picture worrisome that growing masses contributing to dyspnea.  She says that lung function testing in the past has been negative and she has been told she does not have asthma.  Suspect there may be some degree of reactive airways disease or small airways disease related to smoking however.  Plan: Status post  bronchoscopy with biopsy 08/30/2021 by Dr. Valeta Harms no positive biopsies at the bedside. Questionable need for thoracic surgery involvement Continue DuoNebs Cough suppressant PPI The main question is can she be discharged home to follow-up with pulmonary or does she need to be seen in house by thoracic surgery this will be discussed with attending physician today.     Best Practice (right click and "Reselect all SmartList Selections" daily)   Diet/type: Regular consistency (see orders) DVT prophylaxis: SCD GI prophylaxis: N/A Lines: N/A Foley:  N/A Code Status:  full code Last date of multidisciplinary goals of care discussion [1/1]  Labs   CBC: Recent Labs  Lab 08/28/22 1032 08/29/22 1314 08/29/22 1848  WBC 11.7* 12.8* 13.9*  NEUTROABS  --  7.8*  --   HGB 10.2* 9.7* 9.2*  HCT 33.5* 32.5* 29.9*  MCV 57.9* 58.9* 57.2*  PLT 310 311 347    Basic Metabolic Panel: Recent Labs  Lab 08/28/22 1032 08/29/22 1314 08/29/22 1848  NA 140 139 137  K 4.2 3.8 3.7  CL 107 108 107  CO2 25 24 21*  GLUCOSE 99 104* 114*  BUN '8 10 11  '$ CREATININE 0.71 0.69 0.69  CALCIUM 8.8* 8.7* 8.6*   GFR: Estimated Creatinine Clearance: 146.4 mL/min (by C-G formula based on SCr of 0.69 mg/dL). Recent Labs  Lab 08/28/22 1032 08/29/22 1314 08/29/22 1848  WBC 11.7* 12.8* 13.9*    Liver Function Tests: Recent Labs  Lab 08/29/22 1848  AST 15  ALT 10  ALKPHOS 72  BILITOT 0.3  PROT 6.9  ALBUMIN 3.2*   No results for input(s): "LIPASE", "AMYLASE" in the last 168 hours. No results for input(s): "AMMONIA" in the last 168 hours.  ABG    Component Value Date/Time   TCO2 25 06/28/2021 1106     Coagulation Profile: Recent Labs  Lab 08/29/22 1848  INR 1.1    Cardiac Enzymes: No results for input(s): "CKTOTAL", "CKMB", "CKMBINDEX", "TROPONINI" in the last 168 hours.  HbA1C: No results found for: "HGBA1C"  CBG: No results for input(s): "GLUCAP" in the last 168 hours.     Richardson Landry  Rether Rison ACNP Acute Care Nurse Practitioner [place] Please consult Amion 08/31/2022, 9:56 AM

## 2022-09-01 ENCOUNTER — Telehealth: Payer: Self-pay

## 2022-09-01 ENCOUNTER — Encounter: Payer: Self-pay | Admitting: Pulmonary Disease

## 2022-09-01 DIAGNOSIS — R042 Hemoptysis: Secondary | ICD-10-CM | POA: Diagnosis not present

## 2022-09-01 LAB — BASIC METABOLIC PANEL
Anion gap: 10 (ref 5–15)
BUN: 10 mg/dL (ref 6–20)
CO2: 25 mmol/L (ref 22–32)
Calcium: 8.2 mg/dL — ABNORMAL LOW (ref 8.9–10.3)
Chloride: 104 mmol/L (ref 98–111)
Creatinine, Ser: 0.74 mg/dL (ref 0.44–1.00)
GFR, Estimated: >60 mL/min (ref >60)
Glucose, Bld: 112 mg/dL — ABNORMAL HIGH (ref 70–99)
Potassium: 3.5 mmol/L (ref 3.5–5.1)
Sodium: 139 mmol/L (ref 135–145)

## 2022-09-01 LAB — CYTOLOGY - NON PAP

## 2022-09-01 LAB — CBC
HCT: 30.6 % — ABNORMAL LOW (ref 36.0–46.0)
Hemoglobin: 8.8 g/dL — ABNORMAL LOW (ref 12.0–15.0)
MCH: 17 pg — ABNORMAL LOW (ref 26.0–34.0)
MCHC: 28.8 g/dL — ABNORMAL LOW (ref 30.0–36.0)
MCV: 59.1 fL — ABNORMAL LOW (ref 80.0–100.0)
Platelets: 308 10*3/uL (ref 150–400)
RBC: 5.18 MIL/uL — ABNORMAL HIGH (ref 3.87–5.11)
RDW: 19 % — ABNORMAL HIGH (ref 11.5–15.5)
WBC: 14.6 10*3/uL — ABNORMAL HIGH (ref 4.0–10.5)
nRBC: 0 % (ref 0.0–0.2)

## 2022-09-01 LAB — CRYPTOCOCCAL ANTIGEN: Crypto Ag: NEGATIVE

## 2022-09-01 MED ORDER — IPRATROPIUM-ALBUTEROL 0.5-2.5 (3) MG/3ML IN SOLN: 3.0000 mL | Freq: Four times a day (QID) | RESPIRATORY_TRACT | 1 refills | Status: AC

## 2022-09-01 MED ORDER — BENZONATATE 200 MG PO CAPS: 200.0000 mg | ORAL_CAPSULE | Freq: Three times a day (TID) | ORAL | 0 refills | Status: DC

## 2022-09-01 MED ORDER — HYDROCOD POLI-CHLORPHE POLI ER 10-8 MG/5ML PO SUER: 5.0000 mL | Freq: Two times a day (BID) | ORAL | 0 refills | Status: DC | PRN

## 2022-09-01 MED ORDER — MOMETASONE FURO-FORMOTEROL FUM 200-5 MCG/ACT IN AERO: 2.0000 | INHALATION_SPRAY | Freq: Two times a day (BID) | RESPIRATORY_TRACT | 3 refills | Status: DC

## 2022-09-01 NOTE — Discharge Summary (Signed)
Physician Discharge Summary  Patient ID: Julie King MRN: [id number] DOB/AGE: [birth year] 32 y.o.  Admit date: 08/29/2022 Discharge date: 09/01/2022  Problem List Principal Problem:   Hemoptysis Active Problems:   Lung mass  HPI: 32 year old female cigarette smoker presented to the [facility] emergency department on August 29, 2022 complaining of shortness of breath, cough and hemoptysis.  The patient had been seen here the day prior, see consult note for full details.  Briefly, she has been found to have a lingular lung mass in the last 6 to 12 months which was evaluated in a neighboring health system.  She underwent a navigational bronchoscopy in November 2023, pathology from that procedure was not diagnostic of malignancy.  Yesterday she came to the emergency department complaining of coughing up bloody sputum x 1, 1 week of chest tightness and shortness of breath.  Admission offered, she preferred to go home and come back on January 2 for bronchoscopy.  However, overnight she developed worsening cough, shortness of breath and is still coughing up scant amount of blood.  She requested admission for further evaluation.  No recent sick contacts, no fever in the interim since we saw her yesterday.  COVID and flu testing yesterday was negative. She is still smoking cigarettes. Has a history of A-fib, which she says only occurs sporadially, she does not take anticoagulation for it.   Hospital Course:  Assessment/plan: Enlarging left lung mass. Await final results from bronchoscopy biopsies yesterday. Patient is anxious to have this removed.  Discussed case with Dr. Roxan Hockey from cardiothoracic surgery who has recommended getting a PET scan first and follow-up as an outpatient Arrange follow-up with surgery and pulmonary clinic   Cough, hemoptysis Continue to monitor.  May discharge today if she is better Start Dulera, Tessalon Continue Tussionex for cough suppression   Labs at  discharge Lab Results  Component Value Date   CREATININE 0.74 09/01/2022   BUN 10 09/01/2022   NA 139 09/01/2022   K 3.5 09/01/2022   CL 104 09/01/2022   CO2 25 09/01/2022   Lab Results  Component Value Date   WBC 14.6 (H) 09/01/2022   HGB 8.8 (L) 09/01/2022   HCT 30.6 (L) 09/01/2022   MCV 59.1 (L) 09/01/2022   PLT 308 09/01/2022   Lab Results  Component Value Date   ALT 10 08/29/2022   AST 15 08/29/2022   ALKPHOS 72 08/29/2022   BILITOT 0.3 08/29/2022   Lab Results  Component Value Date   INR 1.1 08/29/2022   INR 1.16 12/30/2016    Current radiology studies CT HEAD WO CONTRAST (5MM)  Result Date: 09/01/2022 CLINICAL DATA:  Altered mental status EXAM: CT HEAD WITHOUT CONTRAST TECHNIQUE: Contiguous axial images were obtained from the base of the skull through the vertex without intravenous contrast. RADIATION DOSE REDUCTION: This exam was performed according to the departmental dose-optimization program which includes automated exposure control, adjustment of the mA and/or kV according to patient size and/or use of iterative reconstruction technique. COMPARISON:  None Available. FINDINGS: Brain: There is no mass, hemorrhage or extra-axial collection. The size and configuration of the ventricles and extra-axial CSF spaces are normal. The brain parenchyma is normal, without acute or chronic infarction. Vascular: No abnormal hyperdensity of the major intracranial arteries or dural venous sinuses. No intracranial atherosclerosis. Skull: The visualized skull base, calvarium and extracranial soft tissues are normal. Sinuses/Orbits: No fluid levels or advanced mucosal thickening of the visualized paranasal sinuses. No mastoid or middle ear effusion. The orbits  are normal. IMPRESSION: Normal head CT. Electronically Signed   By: Ulyses Jarred M.D.   On: 09/01/2022 00:31   DG Chest Port 1 View  Result Date: 08/30/2022 CLINICAL DATA:  Post bronchoscopy with biopsy.  Left lung mass. EXAM:  PORTABLE CHEST 1 VIEW COMPARISON:  Radiographs 08/29/2022.  CT 08/28/2022. FINDINGS: Lordotic positioning. The heart size and mediastinal contours are stable. Known mass along the inferior margin of the major fissure is unchanged. No evidence of surrounding hemorrhage, pneumothorax or significant pleural effusion. The right lung is clear. The bones appear unremarkable. IMPRESSION: No evidence of complication following bronchoscopy. Electronically Signed   By: Richardean Sale M.D.   On: 08/30/2022 16:51   DG C-ARM BRONCHOSCOPY  Result Date: 08/30/2022 C-ARM BRONCHOSCOPY: Fluoroscopy was utilized by the requesting physician.  No radiographic interpretation.    Disposition:     Allergies as of 09/01/2022       Reactions   Percocet [oxycodone-acetaminophen] Hives, Itching, Nausea Only   Pineapple Itching   Fresh pineapples; pineapples from a can is okay        Medication List     STOP taking these medications    budesonide-formoterol 160-4.5 MCG/ACT inhaler Commonly known as: Symbicort   ibuprofen 600 MG tablet Commonly known as: ADVIL   ipratropium 0.06 % nasal spray Commonly known as: ATROVENT   montelukast 10 MG tablet Commonly known as: Singulair       TAKE these medications    albuterol 108 (90 Base) MCG/ACT inhaler Commonly known as: VENTOLIN HFA Inhale 2 puffs into the lungs every 6 (six) hours as needed for wheezing or shortness of breath (Cough).   benzonatate 200 MG capsule Commonly known as: TESSALON Take 1 capsule (200 mg total) by mouth 3 (three) times daily.   cetirizine 10 MG tablet Commonly known as: ZyrTEC Allergy Take 1 tablet (10 mg total) by mouth at bedtime.   chlorpheniramine-HYDROcodone 10-8 MG/5ML Commonly known as: TUSSIONEX Take 5 mLs by mouth every 12 (twelve) hours as needed for cough.   fluticasone 50 MCG/ACT nasal spray Commonly known as: FLONASE Place 1 spray into both nostrils daily.   guaifenesin 400 MG Tabs tablet Commonly  known as: HUMIBID E Take 1 tablet 3 times daily as needed for chest congestion and cough   ipratropium-albuterol 0.5-2.5 (3) MG/3ML Soln Commonly known as: DUONEB Take 3 mLs by nebulization 4 (four) times daily.   mometasone-formoterol 200-5 MCG/ACT Aero Commonly known as: DULERA Inhale 2 puffs into the lungs 2 (two) times daily.        Follow-up Information     Icard, Bradley L, DO. Schedule an appointment as soon as possible for a visit in 1 week(s).   Specialty: Pulmonary Disease Contact information: [place] [address] [phone]         Magdalen Spatz, NP. Schedule an appointment as soon as possible for a visit in 1 week(s).   Specialty: Pulmonary Disease Why: Make appt with me or Sarah in 1 week Contact information: [address] [place] [address] [phone]                  Discharged Condition: fair  Time spent on discharge 40 minutes.  Vital signs at Discharge. Temp:  [97.9 F (36.6 C)-98.7 F (37.1 C)] 98.1 F (36.7 C) (01/04 0810) Pulse Rate:  [75-106] 83 (01/04 0810) Resp:  [16-20] 18 (01/04 0810) BP: (119-148)/(63-89) 140/71 (01/04 0810) SpO2:  [99 %-100 %]  99 % (01/04 0810) Office follow up Special Information or instructions. She has multiple questions concerning her condition which multiple people have not answered for her.  And she is not satisfied with the answers were given.  I discussed Signed: Richardson Landry Latora Quarry ACNP Acute Care Nurse Practitioner [place] Please consult [place] 09/01/2022, 10:44 AM

## 2022-09-01 NOTE — Telephone Encounter (Signed)
Returning pt's call from yesterday afternoon. The pt sounded very lethargic on the phone. I asked her how she was and she okay, and also that she was confused. I asked her what it was she was confused about and she said it was all the appointments and doctors. She asked me which doctor it was that I was with, and I told her I was with Dr. Julien Nordmann, the oncologist. I assured the pt that at this time we still don't know its cancer, so there's no need for [place] to make an appointment with him right now. I encouraged her to focus more on keeping her upcoming appointments with providers and imaging, and less on seeing an oncologist for now. Pt verbalized understanding.

## 2022-09-02 ENCOUNTER — Encounter: Payer: Self-pay | Admitting: General Practice

## 2022-09-02 ENCOUNTER — Telehealth: Payer: Self-pay | Admitting: Pulmonary Disease

## 2022-09-02 LAB — ACID FAST SMEAR (AFB, MYCOBACTERIA): Acid Fast Smear: NEGATIVE

## 2022-09-02 LAB — ANGIOTENSIN CONVERTING ENZYME: Angiotensin-Converting Enzyme: 28 U/L (ref 14–82)

## 2022-09-02 LAB — RHEUMATOID FACTOR: Rheumatoid fact SerPl-aCnc: <10 IU/mL (ref <14.0)

## 2022-09-02 LAB — ANA W/REFLEX IF POSITIVE: Anti Nuclear Antibody (ANA): NEGATIVE

## 2022-09-02 LAB — CYCLIC CITRUL PEPTIDE ANTIBODY, IGG/IGA: CCP Antibodies IgG/IgA: 9 units (ref 0–19)

## 2022-09-02 NOTE — Telephone Encounter (Signed)
Mychart message sent by pt: Concha Pyo [facility] (supporting Garner Nash, DO)10 hours ago (10:15 PM)    Hi Dr Kristian Covey you are well . I see the results are in and it says  Prominent acute inflammation no malignancy. What does the  Prominent acute inflammation mean , what is it caused by and what is the best way to treat it at this point ?    Routing to Dr. Valeta Harms for review. Please advise.

## 2022-09-02 NOTE — Telephone Encounter (Signed)
PT has messages in to her [facility] that are still not answered.   Dr. Valeta Harms did her biopsy, she said but she prefers to be in contact w/Dr. Lake Bells. He saw her in the hospital and liked him very much.  Please add Dr. Lake Bells to her Annette Stable, she is asking.  Please have Dr. Lake Bells reply or contact her about the lingering questions she has on her MY CHART encounter:  Hi Dr Kristian Covey you are well . I see the results are in and it says  Prominent acute inflammation no malignancy. What does the  Prominent acute inflammation mean , what is it caused by and what is the best way to treat it at this point ?   TY

## 2022-09-02 NOTE — Progress Notes (Signed)
[place] Spiritual Care Note  Reached Julie King by phone for follow-up support. While relieved to rule out malignancy, she reports continued distress about her condition (coughing up blood, breathing issues, etc).   Provided reflective listening, emotional support, and normalization of feelings. Julie King is aware of ongoing chaplain availability in the event that needs change.   [place], [place], [facility] Pager [phone] Voicemail [phone]

## 2022-09-02 NOTE — Progress Notes (Signed)
[facility] Spiritual Care Note  Followed up by phone as planned. Plan to try again later today or on Monday per [place]'s request.   Julie King, [place], [place] [facility] Pager [phone] Voicemail [phone]

## 2022-09-03 ENCOUNTER — Telehealth: Payer: Self-pay | Admitting: Pulmonary Disease

## 2022-09-03 MED ORDER — PREDNISONE 10 MG PO TABS: 40.0000 mg | ORAL_TABLET | Freq: Every day | ORAL | 0 refills | Status: DC

## 2022-09-03 MED ORDER — ZITHROMAX Z-PAK 250 MG PO TABS: ORAL_TABLET | ORAL | 0 refills | Status: DC

## 2022-09-03 NOTE — Telephone Encounter (Signed)
PCCM weekend coverage note  Patient called to say she is still having hemoptysis (not more than the amount when she was in the hospital) and difficulty breathing. She is anxious about the work up so far. Biopsy results shows inflammation and labs are negative  Send in prescription for z pack and prednsione 40 mg/day for 5 days Continue cough medications, bronchodilators Encouraged to keep the outpatient appointments for work up including PFTs, PET scan and clinic appointments. We will try to avoid a hospital admission as it would set back her work up for the mass as the PET scan cannot be done in the hospital.  Marshell Garfinkel MD [place] [facility] See Amion for pager  If no response to pager , please call [phone] until 7pm After 7:00 pm call Elink  [phone] 09/03/2022, 1:05 PM

## 2022-09-04 LAB — AEROBIC/ANAEROBIC CULTURE W GRAM STAIN (SURGICAL/DEEP WOUND): Culture: NORMAL

## 2022-09-05 ENCOUNTER — Ambulatory Visit (INDEPENDENT_AMBULATORY_CARE_PROVIDER_SITE_OTHER): Payer: Medicaid Other | Admitting: Pulmonary Disease

## 2022-09-05 ENCOUNTER — Institutional Professional Consult (permissible substitution): Payer: Medicaid Other | Admitting: Pulmonary Disease

## 2022-09-05 DIAGNOSIS — R918 Other nonspecific abnormal finding of lung field: Secondary | ICD-10-CM

## 2022-09-05 LAB — PULMONARY FUNCTION TEST
DL/VA % pred: 139 %
DL/VA: 6.26 ml/min/mmHg/L
DLCO cor % pred: 122 %
DLCO cor: 29.82 ml/min/mmHg
DLCO unc % pred: 100 %
DLCO unc: 24.54 ml/min/mmHg
FEF 25-75 Post: 3.08 L/sec
FEF 25-75 Pre: 2.42 L/sec
FEF2575-%Change-Post: 27 %
FEF2575-%Pred-Post: 86 %
FEF2575-%Pred-Pre: 67 %
FEV1-%Change-Post: 6 %
FEV1-%Pred-Post: 90 %
FEV1-%Pred-Pre: 85 %
FEV1-Post: 3.11 L
FEV1-Pre: 2.91 L
FEV1FVC-%Change-Post: 6 %
FEV1FVC-%Pred-Pre: 90 %
FEV6-%Change-Post: 0 %
FEV6-%Pred-Post: 94 %
FEV6-%Pred-Pre: 94 %
FEV6-Post: 3.83 L
FEV6-Pre: 3.82 L
FEV6FVC-%Pred-Post: 101 %
FEV6FVC-%Pred-Pre: 101 %
FVC-%Change-Post: 0 %
FVC-%Pred-Post: 93 %
FVC-%Pred-Pre: 93 %
FVC-Post: 3.83 L
FVC-Pre: 3.82 L
Post FEV1/FVC ratio: 81 %
Post FEV6/FVC ratio: 100 %
Pre FEV1/FVC ratio: 76 %
Pre FEV6/FVC Ratio: 100 %
RV % pred: 103 %
RV: 1.59 L
TLC % pred: 94 %
TLC: 5.12 L

## 2022-09-05 LAB — QUANTIFERON-TB GOLD PLUS (RQFGPL)
QuantiFERON Mitogen Value: 0.49 IU/mL
QuantiFERON Nil Value: 0 IU/mL
QuantiFERON TB1 Ag Value: 0 IU/mL
QuantiFERON TB2 Ag Value: 0.23 IU/mL

## 2022-09-05 LAB — QUANTIFERON-TB GOLD PLUS: QuantiFERON-TB Gold Plus: UNDETERMINED — AB

## 2022-09-05 NOTE — Patient Instructions (Signed)
Full PFT Performed Today  

## 2022-09-05 NOTE — Telephone Encounter (Signed)
Phone encounter received: Julie King   TO   09/02/22 10:18 AM Note PT has messages in to [name] that are still not answered.    Dr. Valeta Harms did her biopsy, she said but she prefers to be in contact w/Dr. Lake Bells. He saw her in the hospital and liked him very much.   Please add Dr. Lake Bells to her Annette Stable, she is asking.   Please have Dr. Lake Bells reply or contact her about the lingering questions she has on her MY CHART encounter:      Routing mychart message to Dr. Lake Bells as well per pt's request.

## 2022-09-05 NOTE — Progress Notes (Deleted)
Synopsis: First seen by [facility] and December 2023 when the patient was hospitalized for hemoptysis in the setting of a lingular lung mass.  This had previously been identified at [facility] in 2023, initially is a 1.5 cm nodule which grew 2 over 2 cm.  On November 7 at [facility] she underwent a robotic biopsy which showed no malignant changes.  She was managed with expectant therapy and repeat CT scans of the chest showed that the nodule was growing and by the time she presented to [facility] in December 2023 with a chief complaint of hemoptysis, shortness of breath and chest tightness the nodule was noted to be greater than 3 cm.  She underwent a robotic biopsy again during the December 2023 hospitalization and the biopsy results showed no evidence of malignancy but inflammatory changes were noted.  The lesion was noted to be bloody with biopsy.  Cardiothoracic surgery saw the patient during that hospitalization and recommended a repeat outpatient PET scan.  Subjective:   PATIENT ID: Julie King GENDER: female DOB: February 24, 1991, MRN: [id number]   HPI  No chief complaint on file.   ***  Past Medical History:  Diagnosis Date   Atrial fibrillation ([place]) 2020   Family history of breast cancer    Headache 07/25/2017   Hypertension    Irritable bowel syndrome    Morbidly obese ([place]) 07/25/2017   QT prolongation    T wave inversion in EKG      Family History  Problem Relation Age of Onset   Hypertension Other    Diabetes Other    Crohn's disease Mother    Hypertension Mother    Thyroid disease Father    Diabetes Father    Breast cancer Maternal Aunt 21   Breast cancer Paternal Grandmother        dx under 62   Breast cancer Other 78       MGMs sister, currently has stage IV     Social History   Socioeconomic History   Marital status: Single    Spouse name: Not on file   Number of children: Not on file   Years of education: Not on file   Highest education level:  Not on file  Occupational History   Occupation: [employer]  Tobacco Use   Smoking status: Every Day    Packs/day: 0.50    Years: 10.00    Total pack years: 5.00    Types: Cigarettes   Smokeless tobacco: Never   Tobacco comments:    wants to quit but states very difficult Jan 2020  Vaping Use   Vaping Use: Never used  Substance and Sexual Activity   Alcohol use: Yes   Drug use: No   Sexual activity: Not Currently    Comment: with female  Other Topics Concern   Not on file  Social History Narrative   Lives with partner   Caffeine use: Coffee daily   Soda sometimes   Right handed    Has one daughter   Social Determinants of Health   Financial Resource Strain: Not on file  Food Insecurity: No Food Insecurity (08/29/2022)   Hunger Vital Sign    Worried About Running Out of Food in the Last Year: Never true    Ran Out of Food in the Last Year: Never true  Transportation Needs: No Transportation Needs (08/29/2022)   PRAPARE - Hydrologist (Medical): No    Lack of Transportation (Non-Medical): No  Physical Activity: Inactive (11/09/2017)   Exercise Vital Sign    Days of Exercise per Week: 0 days    Minutes of Exercise per Session: 0 min  Stress: No Stress Concern Present (11/09/2017)   [place]    Feeling of Stress : Only a little  Social Connections: Somewhat Isolated (11/09/2017)   Social Connection and Isolation Panel [NHANES]    Frequency of Communication with Friends and Family: More than three times a week    Frequency of Social Gatherings with Friends and Family: Once a week    Attends Religious Services: Never    Marine scientist or Organizations: No    Attends Archivist Meetings: Never    Marital Status: Living with partner  Intimate Partner Violence: Not At Risk (08/29/2022)   Humiliation, Afraid, Rape, and Kick questionnaire    Fear of Current  or Ex-Partner: No    Emotionally Abused: No    Physically Abused: No    Sexually Abused: No     Allergies  Allergen Reactions   Percocet [Oxycodone-Acetaminophen] Hives, Itching and Nausea Only   Pineapple Itching    Fresh pineapples; pineapples from a can is okay     Outpatient Medications Prior to Visit  Medication Sig Dispense Refill   albuterol (VENTOLIN HFA) 108 (90 Base) MCG/ACT inhaler Inhale 2 puffs into the lungs every 6 (six) hours as needed for wheezing or shortness of breath (Cough). 18 g 5   benzonatate (TESSALON) 200 MG capsule Take 1 capsule (200 mg total) by mouth 3 (three) times daily. 20 capsule 0   cetirizine (ZYRTEC ALLERGY) 10 MG tablet Take 1 tablet (10 mg total) by mouth at bedtime. (Patient not taking: Reported on 08/29/2022) 90 tablet 1   chlorpheniramine-HYDROcodone (TUSSIONEX) 10-8 MG/5ML Take 5 mLs by mouth every 12 (twelve) hours as needed for cough. 115 mL 0   fluticasone (FLONASE) 50 MCG/ACT nasal spray Place 1 spray into both nostrils daily. (Patient not taking: Reported on 08/29/2022) 47.4 mL 1   guaifenesin (HUMIBID E) 400 MG TABS tablet Take 1 tablet 3 times daily as needed for chest congestion and cough (Patient not taking: Reported on 08/29/2022) 21 tablet 0   ipratropium-albuterol (DUONEB) 0.5-2.5 (3) MG/3ML SOLN Take 3 mLs by nebulization 4 (four) times daily. 360 mL 1   mometasone-formoterol (DULERA) 200-5 MCG/ACT AERO Inhale 2 puffs into the lungs 2 (two) times daily. 1 each 3   predniSONE (DELTASONE) 10 MG tablet Take 4 tablets (40 mg total) by mouth daily with breakfast for 5 days. 20 tablet 0   ZITHROMAX Z-PAK 250 MG tablet Take 2 tablets on day 1 and then 1 tablet every day for the next 4 days 6 tablet 0   No facility-administered medications prior to visit.    ROS    Objective:  Physical Exam   There were no vitals filed for this visit.  ***  CBC    Component Value Date/Time   WBC 14.6 (H) 09/01/2022 0216   RBC 5.18 (H) 09/01/2022  0216   HGB 8.8 (L) 09/01/2022 0216   HGB CANCELED 01/12/2022 1506   HCT 30.6 (L) 09/01/2022 0216   HCT CANCELED 01/12/2022 1506   PLT 308 09/01/2022 0216   PLT CANCELED 01/12/2022 1506   MCV 59.1 (L) 09/01/2022 0216   MCH 17.0 (L) 09/01/2022 0216   MCHC 28.8 (L) 09/01/2022 0216   RDW 19.0 (H) 09/01/2022 0216   LYMPHSABS 4.2 (H) 08/29/2022  [place] 01/12/2022 1506   MONOABS 0.4 08/29/2022 1314   EOSABS 0.3 08/29/2022 1314   EOSABS CANCELED 01/12/2022 1506   BASOSABS 0.1 08/29/2022 1314   BASOSABS CANCELED 01/12/2022 1506     Chest imaging: June 25, 2022 PET CT scan hypermetabolic pulmonary nodule in lingula abutting major fissure, left hilum with increased uptake, SUV max 4.0, reactive inguinal lymph nodes, right ovarian cyst July 05, 2022 robotic bronchoscopy at [facility] August 28, 2022 CT angiogram chest: No pulmonary embolism, left lung mass measuring 3.4 cm, left upper lobe, spanning major fissure.  No pleural effusion.  Radiology notes no enlarged lymph nodes in mediastinum or hilum 08/30/2022 status post bronchoscopy  PFT:  Labs: January 2024 BAL AFB stain negative, culture pending January 2024 BAL culture no growth to date January 2024 BAL fungal culture no growth today  Path: August 30, 2022 left upper lobe needle aspiration, forceps biopsy and brushing: Negative for malignancy, prominent acute inflammatory changes  Echo:  Heart Catheterization:       Assessment & Plan:   No diagnosis found.  Discussion: ***  Immunizations: Immunization History  Administered Date(s) Administered   Tdap 12/26/2019     Current Outpatient Medications:    albuterol (VENTOLIN HFA) 108 (90 Base) MCG/ACT inhaler, Inhale 2 puffs into the lungs every 6 (six) hours as needed for wheezing or shortness of breath (Cough)., Disp: 18 g, Rfl: 5   benzonatate (TESSALON) 200 MG capsule, Take 1 capsule (200 mg total) by mouth 3 (three) times daily., Disp: 20  capsule, Rfl: 0   cetirizine (ZYRTEC ALLERGY) 10 MG tablet, Take 1 tablet (10 mg total) by mouth at bedtime. (Patient not taking: Reported on 08/29/2022), Disp: 90 tablet, Rfl: 1   chlorpheniramine-HYDROcodone (TUSSIONEX) 10-8 MG/5ML, Take 5 mLs by mouth every 12 (twelve) hours as needed for cough., Disp: 115 mL, Rfl: 0   fluticasone (FLONASE) 50 MCG/ACT nasal spray, Place 1 spray into both nostrils daily. (Patient not taking: Reported on 08/29/2022), Disp: 47.4 mL, Rfl: 1   guaifenesin (HUMIBID E) 400 MG TABS tablet, Take 1 tablet 3 times daily as needed for chest congestion and cough (Patient not taking: Reported on 08/29/2022), Disp: 21 tablet, Rfl: 0   ipratropium-albuterol (DUONEB) 0.5-2.5 (3) MG/3ML SOLN, Take 3 mLs by nebulization 4 (four) times daily., Disp: 360 mL, Rfl: 1   mometasone-formoterol (DULERA) 200-5 MCG/ACT AERO, Inhale 2 puffs into the lungs 2 (two) times daily., Disp: 1 each, Rfl: 3   predniSONE (DELTASONE) 10 MG tablet, Take 4 tablets (40 mg total) by mouth daily with breakfast for 5 days., Disp: 20 tablet, Rfl: 0   ZITHROMAX Z-PAK 250 MG tablet, Take 2 tablets on day 1 and then 1 tablet every day for the next 4 days, Disp: 6 tablet, Rfl: 0

## 2022-09-05 NOTE — Telephone Encounter (Signed)
Mychart message routed to Dr. Lake Bells.

## 2022-09-05 NOTE — Progress Notes (Signed)
Full PFT Performed Today  

## 2022-09-06 ENCOUNTER — Ambulatory Visit (INDEPENDENT_AMBULATORY_CARE_PROVIDER_SITE_OTHER): Payer: Medicaid Other | Admitting: Pulmonary Disease

## 2022-09-06 ENCOUNTER — Encounter: Payer: Self-pay | Admitting: Pulmonary Disease

## 2022-09-06 ENCOUNTER — Other Ambulatory Visit: Payer: Self-pay | Admitting: Pulmonary Disease

## 2022-09-06 VITALS — BP 130/82 | HR 110 | Ht 67.0 in | Wt 314.8 lb

## 2022-09-06 DIAGNOSIS — D509 Iron deficiency anemia, unspecified: Secondary | ICD-10-CM

## 2022-09-06 DIAGNOSIS — R042 Hemoptysis: Secondary | ICD-10-CM

## 2022-09-06 DIAGNOSIS — R051 Acute cough: Secondary | ICD-10-CM

## 2022-09-06 DIAGNOSIS — D500 Iron deficiency anemia secondary to blood loss (chronic): Secondary | ICD-10-CM

## 2022-09-06 DIAGNOSIS — R918 Other nonspecific abnormal finding of lung field: Secondary | ICD-10-CM | POA: Diagnosis not present

## 2022-09-06 DIAGNOSIS — K219 Gastro-esophageal reflux disease without esophagitis: Secondary | ICD-10-CM

## 2022-09-06 DIAGNOSIS — R0600 Dyspnea, unspecified: Secondary | ICD-10-CM

## 2022-09-06 LAB — ASPERGILLUS ANTIGEN, BAL/SERUM: Aspergillus Ag, BAL/Serum: 0.07 Index (ref 0.00–0.49)

## 2022-09-06 MED ORDER — PREDNISONE 10 MG PO TABS: ORAL_TABLET | ORAL | 0 refills | Status: AC

## 2022-09-06 MED ORDER — NICOTINE 14 MG/24HR TD PT24: 14.0000 mg | MEDICATED_PATCH | Freq: Every day | TRANSDERMAL | 1 refills | Status: DC

## 2022-09-06 MED ORDER — AMOXICILLIN-POT CLAVULANATE 875-125 MG PO TABS: 1.0000 | ORAL_TABLET | Freq: Two times a day (BID) | ORAL | 0 refills | Status: DC

## 2022-09-06 NOTE — Progress Notes (Signed)
FYI bronch results. I think you are seeing her currently in the office   Thanks,  [place], DO [facility] 09/06/2022 2:50 PM

## 2022-09-06 NOTE — Patient Instructions (Signed)
Left upper lobe inflammatory lesion, with hemoptysis: Concern for abscess though diagnosis still unclear Stop azithromycin Complete current course of prednisone When you complete your course of prednisone start taking prednisone 20 mg daily for 10 days then 10 mg daily for 10 days Start taking Augmentin 875 twice daily Keep the appointment for the PET scan Keep the appointment for thoracic surgery Hopefully the PET scan will show that this lesion is decreasing in size and you will be feeling better over the next few weeks However, if you continue to have symptoms or if it is growing still then it may need to be resected  Shortness of breath: We will prescribe a nebulizer for you to use with the albuterol at home  Cigarette smoking: You need to stop smoking right away I will send a prescription for nicotine patches use 14 mcg daily  Anemia: I am going to order a blood transfusion, I will make arrangements for this within the next 24 hours  Likely gastroesophageal reflux disease contributing to cough: Take over-the-counter Prilosec 20 mg daily  Cough: Continue Tessalon during the day, Tussionex at night, do not take this with any sedatives  Follow-up with me in 3 weeks, sooner if needed.

## 2022-09-06 NOTE — Progress Notes (Addendum)
Synopsis: First seen by [facility] and December 2023 when the patient was hospitalized for hemoptysis in the setting of a lingular lung mass.  This had previously been identified at [facility] in 2023, initially is a 1.5 cm nodule which grew 2 over 2 cm.  On November 7 at [facility] she underwent a robotic biopsy which showed no malignant changes.  She was managed with expectant therapy and repeat CT scans of the chest showed that the nodule was growing and by the time she presented to [facility] in December 2023 with a chief complaint of hemoptysis, shortness of breath and chest tightness the nodule was noted to be greater than 3 cm.  She underwent a robotic biopsy again during the December 2023 hospitalization and the biopsy results showed no evidence of malignancy but inflammatory changes were noted.  The lesion was noted to be bloody with biopsy.  Cardiothoracic surgery saw the patient during that hospitalization and recommended a repeat outpatient PET scan.  Subjective:   PATIENT ID: Julie King GENDER: female DOB: May 13, 1991, MRN: [id number]   HPI  Chief Complaint  Patient presents with   Consult    Referred for hemoptysis. States she is coughing up bright red phlegm. Completed her PFT yesterday. Has also noticed some increased SOB.     [name] has been feeling poorly since the last visit.  She is still coughing, coughing up bright red blood from time to time.  Some chills, no fever.  Feels short of breath particularly with cough.  She is hoarse and having strong coughing spells.  She is taking the Z-Pak as well as the prednisone.  She has her PET scan scheduled for later this month.  She has questions about the QuantiFERON blood test which was indeterminant.  Denies heartburn or indigestion.  She says she can feel more wheezing and rattling when she lay on her left side.  Still smoking cigarettes but she is cut down significantly.  Currently smoking about 1/2 pack cigarettes a  day.  Past Medical History:  Diagnosis Date   Atrial fibrillation ([place]) 2020   Family history of breast cancer    Headache 07/25/2017   Hypertension    Irritable bowel syndrome    Morbidly obese ([place]) 07/25/2017   QT prolongation    T wave inversion in EKG      Family History  Problem Relation Age of Onset   Hypertension Other    Diabetes Other    Crohn's disease Mother    Hypertension Mother    Thyroid disease Father    Diabetes Father    Breast cancer Maternal Aunt 25   Breast cancer Paternal Grandmother        dx under 51   Breast cancer Other 51       MGMs sister, currently has stage IV     Social History   Socioeconomic History   Marital status: Single    Spouse name: Not on file   Number of children: Not on file   Years of education: Not on file   Highest education level: Not on file  Occupational History   Occupation: [employer]  Tobacco Use   Smoking status: Every Day    Packs/day: 0.50    Years: 10.00    Total pack years: 5.00    Types: Cigarettes   Smokeless tobacco: Never   Tobacco comments:    wants to quit but states very difficult Jan 2020  Vaping Use   Vaping Use:  Never used  Substance and Sexual Activity   Alcohol use: Yes   Drug use: No   Sexual activity: Not Currently    Comment: with female  Other Topics Concern   Not on file  Social History Narrative   Lives with partner   Caffeine use: Coffee daily   Soda sometimes   Right handed    Has one daughter   Social Determinants of Health   Financial Resource Strain: Not on file  Food Insecurity: No Food Insecurity (08/29/2022)   Hunger Vital Sign    Worried About Running Out of Food in the Last Year: Never true    Ran Out of Food in the Last Year: Never true  Transportation Needs: No Transportation Needs (08/29/2022)   PRAPARE - Hydrologist (Medical): No    Lack of Transportation (Non-Medical): No  Physical Activity: Inactive (11/09/2017)    Exercise Vital Sign    Days of Exercise per Week: 0 days    Minutes of Exercise per Session: 0 min  Stress: No Stress Concern Present (11/09/2017)   [place]    Feeling of Stress : Only a little  Social Connections: Somewhat Isolated (11/09/2017)   Social Connection and Isolation Panel [NHANES]    Frequency of Communication with Friends and Family: More than three times a week    Frequency of Social Gatherings with Friends and Family: Once a week    Attends Religious Services: Never    Marine scientist or Organizations: No    Attends Archivist Meetings: Never    Marital Status: Living with partner  Intimate Partner Violence: Not At Risk (08/29/2022)   Humiliation, Afraid, Rape, and Kick questionnaire    Fear of Current or Ex-Partner: No    Emotionally Abused: No    Physically Abused: No    Sexually Abused: No     Allergies  Allergen Reactions   Percocet [Oxycodone-Acetaminophen] Hives, Itching and Nausea Only   Pineapple Itching    Fresh pineapples; pineapples from a can is okay     Outpatient Medications Prior to Visit  Medication Sig Dispense Refill   albuterol (VENTOLIN HFA) 108 (90 Base) MCG/ACT inhaler Inhale 2 puffs into the lungs every 6 (six) hours as needed for wheezing or shortness of breath (Cough). 18 g 5   benzonatate (TESSALON) 200 MG capsule Take 1 capsule (200 mg total) by mouth 3 (three) times daily. 20 capsule 0   chlorpheniramine-HYDROcodone (TUSSIONEX) 10-8 MG/5ML Take 5 mLs by mouth every 12 (twelve) hours as needed for cough. 115 mL 0   fluticasone (FLONASE) 50 MCG/ACT nasal spray Place 1 spray into both nostrils daily. 47.4 mL 1   ipratropium-albuterol (DUONEB) 0.5-2.5 (3) MG/3ML SOLN Take 3 mLs by nebulization 4 (four) times daily. 360 mL 1   mometasone-formoterol (DULERA) 200-5 MCG/ACT AERO Inhale 2 puffs into the lungs 2 (two) times daily. 1 each 3   predniSONE (DELTASONE)  10 MG tablet Take 4 tablets (40 mg total) by mouth daily with breakfast for 5 days. 20 tablet 0   ZITHROMAX Z-PAK 250 MG tablet Take 2 tablets on day 1 and then 1 tablet every day for the next 4 days 6 tablet 0   cetirizine (ZYRTEC ALLERGY) 10 MG tablet Take 1 tablet (10 mg total) by mouth at bedtime. (Patient not taking: Reported on 08/29/2022) 90 tablet 1   guaifenesin (HUMIBID E) 400 MG TABS tablet Take 1 tablet  3 times daily as needed for chest congestion and cough (Patient not taking: Reported on 08/29/2022) 21 tablet 0   No facility-administered medications prior to visit.    Review of Systems  Constitutional:  Positive for malaise/fatigue. Negative for chills, fever and weight loss.  HENT:  Negative for congestion, sinus pain and sore throat.   Respiratory:  Positive for cough, hemoptysis and shortness of breath. Negative for sputum production.   Cardiovascular:  Negative for chest pain and leg swelling.      Objective:  Physical Exam   Vitals:   09/06/22 1444  BP: 130/82  Pulse: (!) 110  SpO2: 99%  Weight: (!) 314 lb 12.8 oz (142.8 kg)  Height: _0  (1.702 m)    Gen: mildly ill appearing but speaking in full sentences HENT: OP clear, neck supple PULM: CTA B, normal effort  CV: RRR, no mgr GI: BS+, soft, nontender Derm: no cyanosis or rash Psyche: normal mood and affect   CBC    Component Value Date/Time   WBC 14.6 (H) 09/01/2022 0216   RBC 5.18 (H) 09/01/2022 0216   HGB 8.8 (L) 09/01/2022 0216   HGB CANCELED 01/12/2022 1506   HCT 30.6 (L) 09/01/2022 0216   HCT CANCELED 01/12/2022 1506   PLT 308 09/01/2022 0216   PLT CANCELED 01/12/2022 1506   MCV 59.1 (L) 09/01/2022 0216   MCH 17.0 (L) 09/01/2022 0216   MCHC 28.8 (L) 09/01/2022 0216   RDW 19.0 (H) 09/01/2022 0216   LYMPHSABS 4.2 (H) 08/29/2022 1314   LYMPHSABS CANCELED 01/12/2022 1506   MONOABS 0.4 08/29/2022 1314   EOSABS 0.3 08/29/2022 1314   EOSABS CANCELED 01/12/2022 1506   BASOSABS 0.1 08/29/2022  1314   BASOSABS CANCELED 01/12/2022 1506     Chest imaging: June 25, 2022 PET CT scan hypermetabolic pulmonary nodule in lingula abutting major fissure, left hilum with increased uptake, SUV max 4.0, reactive inguinal lymph nodes, right ovarian cyst July 05, 2022 robotic [facility] at [facility] August 28, 2022 CT angiogram chest: No pulmonary embolism, left lung mass measuring 3.4 cm, left upper lobe, spanning major fissure.  No pleural effusion.  Radiology notes no enlarged lymph nodes in mediastinum or hilum 08/30/2022 status post bronchoscopy  PFT: January 2024 ratio 81%, FEV1 3.11 L 90% predicted, total lung capacity 5.12 L 94% predicted, DLCO 24.54 100% predicted  Labs: January 2024 BAL AFB stain negative, culture pending January 2024 BAL culture no growth to date January 2024 BAL fungal culture no growth today  Path: August 30, 2022 left upper lobe needle aspiration, forceps biopsy and brushing: Negative for malignancy, prominent acute inflammatory changes  Echo:  Heart Catheterization:       Assessment & Plan:   Dyspnea, unspecified type - Plan: Ambulatory Referral for DME  Hemoptysis  Lung mass  Gastroesophageal reflux disease, unspecified whether esophagitis present  Acute cough  Iron deficiency anemia due to chronic blood loss  Discussion: It still not clear what is causing Shakerria's hemoptysis but on 2 separate occasions now biopsy results have yielded no evidence of malignancy.  The most recent biopsy showed significant inflammatory cells, no microbiology stains revealing a clear cause.  Some suggestion of foreign body.  I wonder about the possibility of an abscess which may be forming.  Serum quant of Farren gold testing was indeterminate due to a low mitogen, but there is no clinical evidence of TB based on the bronchoscopies which she has had.  I think this is probably an abscess though it  is unclear to me why it spanning 2 lobes.  Need to follow  cultures to ensure there is no evidence of Actinomycosis, nocardia etc.  At this time I think it makes   Plan: Left upper lobe inflammatory lesion, with hemoptysis: Concern for abscess though diagnosis still unclear Stop azithromycin Complete current course of prednisone When you complete your course of prednisone start taking prednisone 20 mg daily for 10 days then 10 mg daily for 10 days Start taking Augmentin 875 twice daily Keep the appointment for the PET scan Keep the appointment for thoracic surgery Hopefully the PET scan will show that this lesion is decreasing in size and you will be feeling better over the next few weeks However, if you continue to have symptoms or if it is growing still then it may need to be resected  Shortness of breath: We will prescribe a nebulizer for you to use with the albuterol at home  Cigarette smoking: You need to stop smoking right away I will send a prescription for nicotine patches use 14 mcg daily  Anemia: I am going to order a blood transfusion, I will make arrangements for this within the next 24 hours  Likely gastroesophageal reflux disease contributing to cough: Take over-the-counter Prilosec 20 mg daily  Cough: Continue Tessalon during the day, Tussionex at night, do not take this with any sedatives  Follow-up with me in 3 weeks, sooner if needed.  Immunizations: Immunization History  Administered Date(s) Administered   Tdap 12/26/2019     Current Outpatient Medications:    albuterol (VENTOLIN HFA) 108 (90 Base) MCG/ACT inhaler, Inhale 2 puffs into the lungs every 6 (six) hours as needed for wheezing or shortness of breath (Cough)., Disp: 18 g, Rfl: 5   amoxicillin-clavulanate (AUGMENTIN) 875-125 MG tablet, Take 1 tablet by mouth 2 (two) times daily for 21 days., Disp: 42 tablet, Rfl: 0   benzonatate (TESSALON) 200 MG capsule, Take 1 capsule (200 mg total) by mouth 3 (three) times daily., Disp: 20 capsule, Rfl: 0    chlorpheniramine-HYDROcodone (TUSSIONEX) 10-8 MG/5ML, Take 5 mLs by mouth every 12 (twelve) hours as needed for cough., Disp: 115 mL, Rfl: 0   fluticasone (FLONASE) 50 MCG/ACT nasal spray, Place 1 spray into both nostrils daily., Disp: 47.4 mL, Rfl: 1   ipratropium-albuterol (DUONEB) 0.5-2.5 (3) MG/3ML SOLN, Take 3 mLs by nebulization 4 (four) times daily., Disp: 360 mL, Rfl: 1   mometasone-formoterol (DULERA) 200-5 MCG/ACT AERO, Inhale 2 puffs into the lungs 2 (two) times daily., Disp: 1 each, Rfl: 3   nicotine (NICODERM CQ - DOSED IN MG/24 HOURS) 14 mg/24hr patch, Place 1 patch (14 mg total) onto the skin daily., Disp: 28 patch, Rfl: 1   predniSONE (DELTASONE) 10 MG tablet, Take 4 tablets (40 mg total) by mouth daily with breakfast for 5 days., Disp: 20 tablet, Rfl: 0   predniSONE (DELTASONE) 10 MG tablet, Take 2 tablets (20 mg total) by mouth daily with breakfast for 10 days, THEN 1 tablet (10 mg total) daily with breakfast for 10 days., Disp: 30 tablet, Rfl: 0   ZITHROMAX Z-PAK 250 MG tablet, Take 2 tablets on day 1 and then 1 tablet every day for the next 4 days, Disp: 6 tablet, Rfl: 0

## 2022-09-07 ENCOUNTER — Encounter: Payer: Self-pay | Admitting: Pulmonary Disease

## 2022-09-07 ENCOUNTER — Institutional Professional Consult (permissible substitution): Payer: Medicaid Other | Admitting: Pulmonary Disease

## 2022-09-07 DIAGNOSIS — D509 Iron deficiency anemia, unspecified: Secondary | ICD-10-CM

## 2022-09-07 DIAGNOSIS — K625 Hemorrhage of anus and rectum: Secondary | ICD-10-CM

## 2022-09-07 LAB — BLASTOMYCES ANTIGEN: Blastomyces Antigen: NOT DETECTED ng/mL

## 2022-09-07 NOTE — Telephone Encounter (Signed)
Magdalen Spatz, NP  You11 minutes ago (3:00 PM)    Raquel Sarna, Dr. Lake Bells has ordered a blood transfusion which is the most important part of the plan. He can review this when he gets back from his vacation as to if this is a contributing cause. Marland Kitchen Please call patient and ask if she has a GI doctor and if she does have her follow up with them regarding her bleeding hemorrhoids. If she does not see a GI doctor, she may need a referral . Thanks

## 2022-09-07 NOTE — Telephone Encounter (Signed)
Mychart message sent by pt: Julie King [facility] (supporting Juanito Doom, MD)5 hours ago (3:37 AM)    So I was thinking about my hemoglobin, I have been having a quite a bit of rectal bleeding with small  blood clots lately, so I'm wondering if that could be whats effecting My hemoglobin also I remember you saying benign things don't move to a different lobe would you say that it's the inflammation that's spreading to the other lobe or whatever the mass or foreign object      Pt just had a visit with  Dr. Lake Bells yesterday 1/9. Do not see anything about rectal bleeding being stated during that visit.  With Dr. Lake Bells being out of the office, sending this to provider of the day. Sarah, please advise.

## 2022-09-08 ENCOUNTER — Telehealth: Payer: Self-pay | Admitting: Pulmonary Disease

## 2022-09-08 ENCOUNTER — Encounter: Payer: Self-pay | Admitting: Internal Medicine

## 2022-09-08 ENCOUNTER — Ambulatory Visit (INDEPENDENT_AMBULATORY_CARE_PROVIDER_SITE_OTHER): Payer: Medicaid Other | Admitting: Internal Medicine

## 2022-09-08 VITALS — BP 120/82 | HR 106 | Ht 66.0 in | Wt 315.8 lb

## 2022-09-08 DIAGNOSIS — R918 Other nonspecific abnormal finding of lung field: Secondary | ICD-10-CM

## 2022-09-08 DIAGNOSIS — R042 Hemoptysis: Secondary | ICD-10-CM

## 2022-09-08 NOTE — Telephone Encounter (Signed)
Has had hemoptysis , as long as decreasing, it may take a while for the abx to work  If worsens or becomes bright red will need to be seen or go to ER  Please contact office for sooner follow up if symptoms do not improve or worsen or seek emergency care   If increased will need to be seen

## 2022-09-08 NOTE — Telephone Encounter (Signed)
Patient stated she is returning a call to the office.  Please call patient back at [phone]

## 2022-09-08 NOTE — Telephone Encounter (Signed)
Tried to call pt and line went straight to VM. Left message for her to return call.

## 2022-09-08 NOTE — Telephone Encounter (Signed)
Mychart message sent by pt:   Concha Pyo [facility] (supporting Juanito Doom, MD)21 minutes ago (9:32 AM)    Just wanted to give you guys a heads up the amount of blood I have been coughing up this morning is definitely an increase from yesterday. Is there anything that we can do about this?    I recommended pt going to the ED to be evaluated especially since Dr. Lake Bells has already ordered pt to have a blood transfusion done.  After stating that to pt, pt then sent a new mychart message along with an image of what she coughed up which is able to be seen in the media section of pt's message.   With Dr. Lake Bells not being available, sending this to provider of the day for review.   Tammy, please advise.

## 2022-09-08 NOTE — Telephone Encounter (Signed)
Seen by Dr. Annamaria Boots he to

## 2022-09-08 NOTE — Progress Notes (Signed)
Synopsis: First seen by [facility] and December 2023 when the patient was hospitalized for hemoptysis in the setting of a lingular lung mass.  This had previously been identified at [facility] in 2023, initially is a 1.5 cm nodule which grew 2 over 2 cm.  On November 7 at [facility] she underwent a robotic biopsy which showed no malignant changes.  She was managed with expectant therapy and repeat CT scans of the chest showed that the nodule was growing and by the time she presented to [facility] in December 2023 with a chief complaint of hemoptysis, shortness of breath and chest tightness the nodule was noted to be greater than 3 cm.  She underwent a robotic biopsy again during the December 2023 hospitalization and the biopsy results showed no evidence of malignancy but inflammatory changes were noted.  The lesion was noted to be bloody with biopsy.  Cardiothoracic surgery saw the patient during that hospitalization and recommended a repeat outpatient PET scan.  Subjective:   PATIENT ID: Julie King GENDER: female DOB: 1991/03/09, MRN: [id number]   HPI  Consult    Referred for hemoptysis. States she is coughing up bright red phlegm. Completed her PFT yesterday. Has also noticed some increased SOB.    09/06/22-Dr McQuaid--Julie King has been feeling poorly since the last visit.  She is still coughing, coughing up bright red blood from time to time.  Some chills, no fever.  Feels short of breath particularly with cough.  She is hoarse and having strong coughing spells.  She is taking the Z-Pak as well as the prednisone.  She has her PET scan scheduled for later this month.  She has questions about the QuantiFERON blood test which was indeterminant.  Denies heartburn or indigestion.  She says she can feel more wheezing and rattling when she lay on her left side.  Still smoking cigarettes but she is cut down significantly.  Currently smoking about 1/2 pack cigarettes a day.  09/08/22- [name] Smoker  (1/2ppd) followed for L lung mass, persistent hemoptysis. Now coming for acute visit. Had bronchoscopy 08/30/22> inflammation, no malignancy Note microcytic anemia Hgb 8.8 on 1/4.Quantiferon Gold indeterminate and AFB culture pending. Pending Transfusion 1/15, PET 1/18. [facility] (Dr Roxan Hockey) 1/22, Dr Lake Bells 2/6. Extensive labs> I don't see ANCA. Now on prednisone 20 mg daily, pending drop to 10 mg daily. Recent Zpak.> augmentin started 1/9. Added otc Prilosec for suspected GERD, tessalon, Tussionex. Bleeding had slowed in recent days but picked up again today for no clear reason.  Shows me pictures of teaspoon/tablespoon amounts of BRB in clear mucus.  No frank hemorrhaging and no other bleeding recognized. Has a hyperpigmented spot on upper right anterior chest about the size of a $0.50 piece that appeared a month ago but no other rash.  In recent days has some pressure sensation in the high mid abdomen anteriorly just to the left of midline.  This is not affected by eating but is sensitive to firm pressure and position changes.  She denies fever, chills, arthralgias, adenopathy.  No sinus disease on head CT 08/31/22.  Past Medical History:  Diagnosis Date   Atrial fibrillation ([place]) 2020   Family history of breast cancer    Headache 07/25/2017   Hypertension    Irritable bowel syndrome    Morbidly obese ([place]) 07/25/2017   QT prolongation    T wave inversion in EKG       Outpatient Medications Prior to Visit  Medication Sig Dispense Refill  albuterol (VENTOLIN HFA) 108 (90 Base) MCG/ACT inhaler Inhale 2 puffs into the lungs every 6 (six) hours as needed for wheezing or shortness of breath (Cough). 18 g 5   benzonatate (TESSALON) 200 MG capsule Take 1 capsule (200 mg total) by mouth 3 (three) times daily. 20 capsule 0   chlorpheniramine-HYDROcodone (TUSSIONEX) 10-8 MG/5ML Take 5 mLs by mouth every 12 (twelve) hours as needed for cough. 115 mL 0   fluticasone (FLONASE) 50 MCG/ACT nasal spray  Place 1 spray into both nostrils daily. 47.4 mL 1   ipratropium-albuterol (DUONEB) 0.5-2.5 (3) MG/3ML SOLN Take 3 mLs by nebulization 4 (four) times daily. 360 mL 1   mometasone-formoterol (DULERA) 200-5 MCG/ACT AERO Inhale 2 puffs into the lungs 2 (two) times daily. 1 each 3   predniSONE (DELTASONE) 10 MG tablet Take 4 tablets (40 mg total) by mouth daily with breakfast for 5 days. 20 tablet 0   ZITHROMAX Z-PAK 250 MG tablet Take 2 tablets on day 1 and then 1 tablet every day for the next 4 days 6 tablet 0   cetirizine (ZYRTEC ALLERGY) 10 MG tablet Take 1 tablet (10 mg total) by mouth at bedtime. (Patient not taking: Reported on 08/29/2022) 90 tablet 1   guaifenesin (HUMIBID E) 400 MG TABS tablet Take 1 tablet 3 times daily as needed for chest congestion and cough (Patient not taking: Reported on 08/29/2022) 21 tablet 0   No facility-administered medications prior to visit.    Review of Systems  Constitutional:  Positive for malaise/fatigue. Negative for chills, fever and weight loss.  HENT:  Negative for congestion, sinus pain and sore throat.   Respiratory:  Positive for cough, hemoptysis and shortness of breath. Negative for sputum production.   Cardiovascular:  Negative for chest pain and leg swelling.  Abdomen-+ pressure upper abdomen    Objective:  Physical Exam   OBJ- Physical Exam   Obese General- Alert, Oriented, Affect-appropriate, Distress- none acute Skin- r+hyperpigmented area upper R anterior chest Lymphadenopathy- none Head- atraumatic            Eyes- Gross vision intact, PERRLA, conjunctivae and secretions clear            Ears- Hearing, canals-normal            Nose- Clear, no-Septal dev, mucus, polyps, erosion, perforation             Throat- Mallampati II , mucosa clear , drainage- none, tonsils- atrophic Neck- flexible , trachea midline, no stridor , thyroid nl, carotid no bruit Chest - symmetrical excursion , unlabored           Heart/CV- RRR , no murmur , no  gallop  , no rub, nl s1 s2                           - JVD- none , edema- none, stasis changes- none, varices- none           Lung- clear to P&A, wheeze- none, cough+light loose rattle , dullness-none, rub- none           Chest wall-  Abd-  Br/ Gen/ Rectal- Not done, not indicated Extrem- cyanosis- none, clubbing, none, atrophy- none, strength- nl Neuro- grossly intact to observation   Chest imaging: June 25, 2022 PET CT scan hypermetabolic pulmonary nodule in lingula abutting major fissure, left hilum with increased uptake, SUV max 4.0, reactive inguinal lymph nodes, right ovarian cyst July 05, 2022 robotic bronchoscopy  at [facility] August 28, 2022 CT angiogram chest: No pulmonary embolism, left lung mass measuring 3.4 cm, left upper lobe, spanning major fissure.  No pleural effusion.  Radiology notes no enlarged lymph nodes in mediastinum or hilum 08/30/2022 status post bronchoscopy  PFT: January 2024 ratio 81%, FEV1 3.11 L 90% predicted, total lung capacity 5.12 L 94% predicted, DLCO 24.54 100% predicted  Labs: January 2024 BAL AFB stain negative, culture pending January 2024 BAL culture no growth to date January 2024 BAL fungal culture no growth today  Path: August 30, 2022 left upper lobe needle aspiration, forceps biopsy and brushing: Negative for malignancy, prominent acute inflammatory changes    Current Outpatient Medications:    albuterol (VENTOLIN HFA) 108 (90 Base) MCG/ACT inhaler, Inhale 2 puffs into the lungs every 6 (six) hours as needed for wheezing or shortness of breath (Cough)., Disp: 18 g, Rfl: 5   amoxicillin-clavulanate (AUGMENTIN) 875-125 MG tablet, Take 1 tablet by mouth 2 (two) times daily for 21 days., Disp: 42 tablet, Rfl: 0   benzonatate (TESSALON) 200 MG capsule, Take 1 capsule (200 mg total) by mouth 3 (three) times daily., Disp: 20 capsule, Rfl: 0   chlorpheniramine-HYDROcodone (TUSSIONEX) 10-8 MG/5ML, Take 5 mLs by mouth every 12 (twelve) hours as  needed for cough., Disp: 115 mL, Rfl: 0   fluticasone (FLONASE) 50 MCG/ACT nasal spray, Place 1 spray into both nostrils daily., Disp: 47.4 mL, Rfl: 1   ipratropium-albuterol (DUONEB) 0.5-2.5 (3) MG/3ML SOLN, Take 3 mLs by nebulization 4 (four) times daily., Disp: 360 mL, Rfl: 1   mometasone-formoterol (DULERA) 200-5 MCG/ACT AERO, Inhale 2 puffs into the lungs 2 (two) times daily., Disp: 1 each, Rfl: 3   nicotine (NICODERM CQ - DOSED IN MG/24 HOURS) 14 mg/24hr patch, Place 1 patch (14 mg total) onto the skin daily., Disp: 28 patch, Rfl: 1   predniSONE (DELTASONE) 10 MG tablet, Take 4 tablets (40 mg total) by mouth daily with breakfast for 5 days., Disp: 20 tablet, Rfl: 0   predniSONE (DELTASONE) 10 MG tablet, Take 2 tablets (20 mg total) by mouth daily with breakfast for 10 days, THEN 1 tablet (10 mg total) daily with breakfast for 10 days., Disp: 30 tablet, Rfl: 0   ZITHROMAX Z-PAK 250 MG tablet, Take 2 tablets on day 1 and then 1 tablet every day for the next 4 days, Disp: 6 tablet, Rfl: 0

## 2022-09-08 NOTE — Patient Instructions (Signed)
Order- lab- AntiNuclearCytoplasmic Antibody- ANCA   dx lung mass  Keep appointments for testing as planned

## 2022-09-08 NOTE — Telephone Encounter (Signed)
Please call patient and discuss options

## 2022-09-08 NOTE — Telephone Encounter (Signed)
Addressed in [employer]. We have no open slots for today. Told patient to go to ED since no openings in clinic today. Nothing further needed

## 2022-09-08 NOTE — Assessment & Plan Note (Addendum)
Work-up in progress. Augmentin for possibility abscess.  Consider granulomatous vasculitis. New upper abdominal pressure of uncertain significance. Difficult to examine large abdomen. Watch for splenomegaly. PET may be helpful, upcoming. I would have low threshold for resection. An abscess with drainage into airway more likely to produce purulent material than blood in clear mucus.  Plan- Continue with scheduled testing and appointments. Lab for ANCA

## 2022-09-09 ENCOUNTER — Other Ambulatory Visit (HOSPITAL_COMMUNITY): Payer: Self-pay

## 2022-09-09 ENCOUNTER — Encounter: Payer: Self-pay | Admitting: Internal Medicine

## 2022-09-09 DIAGNOSIS — R042 Hemoptysis: Secondary | ICD-10-CM

## 2022-09-09 NOTE — Assessment & Plan Note (Signed)
Her microcytic anemia suggests possible blood loss from causes other than her hemoptysis.   Plan-limit activity to avoid stimulating cough.  Cough control measures discussed.  Transfusion pending.

## 2022-09-10 LAB — ANCA PROFILE
Anti-MPO Antibodies: <0.2 units (ref 0.0–0.9)
Anti-PR3 Antibodies: <0.2 units (ref 0.0–0.9)
Atypical pANCA: <1:20 {titer}
C-ANCA: <1:20 {titer}
P-ANCA: <1:20 {titer}

## 2022-09-12 ENCOUNTER — Encounter: Payer: Self-pay | Admitting: Pulmonary Disease

## 2022-09-12 ENCOUNTER — Ambulatory Visit (HOSPITAL_COMMUNITY)
Admission: RE | Admit: 2022-09-12 | Discharge: 2022-09-12 | Disposition: A | Discharge status: Home / Self Care | Payer: Medicaid Other | Source: Ambulatory Visit | Attending: Pulmonary Disease | Admitting: Pulmonary Disease

## 2022-09-12 DIAGNOSIS — R042 Hemoptysis: Secondary | ICD-10-CM | POA: Insufficient documentation

## 2022-09-12 LAB — PREPARE RBC (CROSSMATCH)

## 2022-09-12 MED ORDER — SODIUM CHLORIDE 0.9% IV SOLUTION: Freq: Once | INTRAVENOUS | Status: DC

## 2022-09-12 NOTE — Telephone Encounter (Signed)
Mychart message sent by pt: Julie [facility] (supporting Juanito Doom, MD)42 minutes ago (12:56 PM)    Hi Dr Lake Bells    I hope you are doing well. I am currently sitting in infusion at the hospital waiting for them to find a match for the blood transfusion( been here since 8 AM, I may have to be moved to a different area because this area It's about to be closed in a couple hours. but I am still having significant pain on my left side and it is getting more noticeable . I don't know if it's the upper part of my abdomen or my chest, the blood has also remained darker/brighter .       Pt saw Dr. Annamaria Boots 1/11 for an acute visit. Pt also has been recommended multiple times in prior encounters to go to the ED to be evaluated. Dr. Lake Bells, please advise on this for pt if you have any further recommendations.

## 2022-09-13 ENCOUNTER — Other Ambulatory Visit: Payer: Self-pay

## 2022-09-13 ENCOUNTER — Encounter (HOSPITAL_BASED_OUTPATIENT_CLINIC_OR_DEPARTMENT_OTHER): Payer: Self-pay

## 2022-09-13 ENCOUNTER — Emergency Department (HOSPITAL_BASED_OUTPATIENT_CLINIC_OR_DEPARTMENT_OTHER)
Admission: EM | Admit: 2022-09-13 | Discharge: 2022-09-13 | Disposition: A | Discharge status: Home / Self Care | Payer: Medicaid Other | Attending: Emergency Medicine | Admitting: Emergency Medicine

## 2022-09-13 ENCOUNTER — Emergency Department (HOSPITAL_BASED_OUTPATIENT_CLINIC_OR_DEPARTMENT_OTHER): Payer: Medicaid Other | Admitting: Radiology

## 2022-09-13 DIAGNOSIS — R0789 Other chest pain: Secondary | ICD-10-CM | POA: Diagnosis present

## 2022-09-13 DIAGNOSIS — R1012 Left upper quadrant pain: Secondary | ICD-10-CM | POA: Diagnosis not present

## 2022-09-13 LAB — CBC
HCT: 33.2 % — ABNORMAL LOW (ref 36.0–46.0)
Hemoglobin: 10 g/dL — ABNORMAL LOW (ref 12.0–15.0)
MCH: 17.7 pg — ABNORMAL LOW (ref 26.0–34.0)
MCHC: 30.1 g/dL (ref 30.0–36.0)
MCV: 58.9 fL — ABNORMAL LOW (ref 80.0–100.0)
Platelets: 318 10*3/uL (ref 150–400)
RBC: 5.64 MIL/uL — ABNORMAL HIGH (ref 3.87–5.11)
RDW: 21.3 % — ABNORMAL HIGH (ref 11.5–15.5)
WBC: 12.2 10*3/uL — ABNORMAL HIGH (ref 4.0–10.5)
nRBC: 0.4 % — ABNORMAL HIGH (ref 0.0–0.2)

## 2022-09-13 LAB — HEPATIC FUNCTION PANEL
ALT: 10 U/L (ref 0–44)
AST: 9 U/L — ABNORMAL LOW (ref 15–41)
Albumin: 3.8 g/dL (ref 3.5–5.0)
Alkaline Phosphatase: 70 U/L (ref 38–126)
Bilirubin, Direct: 0.1 mg/dL (ref 0.0–0.2)
Indirect Bilirubin: 0.3 mg/dL (ref 0.3–0.9)
Total Bilirubin: 0.4 mg/dL (ref 0.3–1.2)
Total Protein: 7.6 g/dL (ref 6.5–8.1)

## 2022-09-13 LAB — BASIC METABOLIC PANEL
Anion gap: 10 (ref 5–15)
BUN: 8 mg/dL (ref 6–20)
CO2: 25 mmol/L (ref 22–32)
Calcium: 9.2 mg/dL (ref 8.9–10.3)
Chloride: 104 mmol/L (ref 98–111)
Creatinine, Ser: 0.61 mg/dL (ref 0.44–1.00)
GFR, Estimated: >60 mL/min (ref >60)
Glucose, Bld: 103 mg/dL — ABNORMAL HIGH (ref 70–99)
Potassium: 3.1 mmol/L — ABNORMAL LOW (ref 3.5–5.1)
Sodium: 139 mmol/L (ref 135–145)

## 2022-09-13 LAB — BPAM RBC
Blood Product Expiration Date: 202402122359
ISSUE DATE / TIME: 202401151328
Unit Type and Rh: 6200

## 2022-09-13 LAB — TYPE AND SCREEN
ABO/RH(D): A POS
Antibody Screen: POSITIVE
DAT, IgG: NEGATIVE
PT AG Type: NEGATIVE
Unit division: 0

## 2022-09-13 LAB — LIPASE, BLOOD: Lipase: 30 U/L (ref 11–51)

## 2022-09-13 LAB — TROPONIN I (HIGH SENSITIVITY): Troponin I (High Sensitivity): <2 ng/L (ref <18)

## 2022-09-13 MED ORDER — DICLOFENAC SODIUM 1 % EX GEL: 4.0000 g | Freq: Four times a day (QID) | CUTANEOUS | 0 refills | Status: DC

## 2022-09-13 NOTE — ED Provider Notes (Signed)
[facility] EMERGENCY DEPT Provider Note   CSN: [id number] Arrival date & time: 09/13/22  1234     History  Chief Complaint  Patient presents with   Abdominal Pain   Shortness of Breath   Chest Pain    Julie King is a 32 y.o. female.  32 yo F with a chief complaints of left chest pain.  This has been going on for a little bit over a week.  She had been seen by her pulmonologist for this initially and was told it was likely her known lung mass.  She has had progressive worsening over the course of the week and so called them back today. They were going to have her sent for an x-ray but she had already checked in here.  She has had continued hemoptysis with this.   Abdominal Pain Associated symptoms: chest pain and shortness of breath   Shortness of Breath Associated symptoms: abdominal pain and chest pain   Chest Pain Associated symptoms: abdominal pain and shortness of breath        Home Medications Prior to Admission medications   Medication Sig Start Date End Date Taking? Authorizing Provider  diclofenac Sodium (VOLTAREN) 1 % GEL Apply 4 g topically 4 (four) times daily. 09/13/22  Yes Deno Etienne, DO  albuterol (VENTOLIN HFA) 108 (90 Base) MCG/ACT inhaler Inhale 2 puffs into the lungs every 6 (six) hours as needed for wheezing or shortness of breath (Cough). 06/14/22   Lynden Oxford Scales, PA-C  benzonatate (TESSALON) 200 MG capsule Take 1 capsule (200 mg total) by mouth 3 (three) times daily. 09/01/22   Minor, Grace Bushy, NP  chlorpheniramine-HYDROcodone (TUSSIONEX) 10-8 MG/5ML Take 5 mLs by mouth every 12 (twelve) hours as needed for cough. 09/01/22   Minor, Grace Bushy, NP  fluticasone (FLONASE) 50 MCG/ACT nasal spray Place 1 spray into both nostrils daily. 06/14/22   Lynden Oxford Scales, PA-C  ipratropium-albuterol (DUONEB) 0.5-2.5 (3) MG/3ML SOLN Take 3 mLs by nebulization 4 (four) times daily. 09/01/22   Minor, Grace Bushy, NP  mometasone-formoterol  (DULERA) 200-5 MCG/ACT AERO Inhale 2 puffs into the lungs 2 (two) times daily. 09/01/22   Minor, Grace Bushy, NP  nicotine (NICODERM CQ - DOSED IN MG/24 HOURS) 14 mg/24hr patch Place 1 patch (14 mg total) onto the skin daily. 09/06/22   Juanito Doom, MD  predniSONE (DELTASONE) 10 MG tablet Take 2 tablets (20 mg total) by mouth daily with breakfast for 10 days, THEN 1 tablet (10 mg total) daily with breakfast for 10 days. 09/06/22 09/26/22  Juanito Doom, MD      Allergies    Percocet [oxycodone-acetaminophen] and Pineapple    Review of Systems   Review of Systems  Respiratory:  Positive for shortness of breath.   Cardiovascular:  Positive for chest pain.  Gastrointestinal:  Positive for abdominal pain.    Physical Exam Updated Vital Signs BP 128/66   Pulse 92   Temp 99 F (37.2 C) (Oral)   Resp 19   Ht '5\' 6"'$  (1.676 m)   Wt (!) 141.5 kg   LMP 08/29/2022 (Exact Date)   SpO2 100%   BMI 50.36 kg/m  Physical Exam Vitals and nursing note reviewed.  Constitutional:      General: She is not in acute distress.    Appearance: She is well-developed. She is not diaphoretic.  HENT:     Head: Normocephalic and atraumatic.  Eyes:     Pupils: Pupils are equal, round, and  reactive to light.  Cardiovascular:     Rate and Rhythm: Normal rate and regular rhythm.     Heart sounds: No murmur heard.    No friction rub. No gallop.  Pulmonary:     Effort: Pulmonary effort is normal.     Breath sounds: No wheezing or rales.  Abdominal:     General: There is no distension.     Palpations: Abdomen is soft.     Tenderness: There is no abdominal tenderness.     Comments: Some mild tenderness in the left upper quadrant but much more tender over the left anterior rib angles about ribs 6 7 and 8 at the costal margin.  Musculoskeletal:        General: No tenderness.     Cervical back: Normal range of motion and neck supple.  Skin:    General: Skin is warm and dry.  Neurological:     Mental  Status: She is alert and oriented to person, place, and time.  Psychiatric:        Behavior: Behavior normal.     ED Results / Procedures / Treatments   Labs (all labs ordered are listed, but only abnormal results are displayed) Labs Reviewed  BASIC METABOLIC PANEL - Abnormal; Notable for the following components:      Result Value   Potassium 3.1 (*)    Glucose, Bld 103 (*)    All other components within normal limits  CBC - Abnormal; Notable for the following components:   WBC 12.2 (*)    RBC 5.64 (*)    Hemoglobin 10.0 (*)    HCT 33.2 (*)    MCV 58.9 (*)    MCH 17.7 (*)    RDW 21.3 (*)    nRBC 0.4 (*)    All other components within normal limits  HEPATIC FUNCTION PANEL - Abnormal; Notable for the following components:   AST 9 (*)    All other components within normal limits  LIPASE, BLOOD  PREGNANCY, URINE  TROPONIN I (HIGH SENSITIVITY)  TROPONIN I (HIGH SENSITIVITY)    EKG EKG Interpretation  Date/Time:  Tuesday September 13 2022 12:40:47 EST Ventricular Rate:  97 PR Interval:  128 QRS Duration: 97 QT Interval:  349 QTC Calculation: 444 R Axis:   51 Text Interpretation: Sinus rhythm Borderline T wave abnormalities No significant change since last tracing Confirmed by Deno Etienne [phone]) on 09/13/2022 12:48:58 PM  Radiology DG Chest 2 View  Result Date: 09/13/2022 CLINICAL DATA:  Chest pain and shortness of breath. Recent lung mass diagnosis. EXAM: CHEST - 2 VIEW COMPARISON:  Chest x-ray dated August 30, 2022. FINDINGS: The heart size and mediastinal contours are within normal limits. Normal pulmonary vascularity. Unchanged round mass along the inferior margin of the left major fissure, predominantly within the lingula. No focal consolidation, pleural effusion, or pneumothorax. No acute osseous abnormality. IMPRESSION: 1. No acute cardiopulmonary disease. 2. Unchanged lingular lung mass, previously biopsied. Electronically Signed   By: Titus Dubin M.D.   On:  09/13/2022 13:40    Procedures Procedures    Medications Ordered in ED Medications - No data to display  ED Course/ Medical Decision Making/ A&P                             Medical Decision Making Amount and/or Complexity of Data Reviewed Labs: ordered. Radiology: ordered.   32 yo F with a significant past medical history of lung  mass comes in with a chief complaints of left-sided chest discomfort.  She had been seen by the pulmonologist office earlier with the onset of the symptoms and they have gotten progressively worse.  Will obtain a chest x-ray blood work.  Reassess.  Chest x-ray without obvious change from prior on my independent interpretation.  Troponin negative.  LFTs unremarkable.  Lipase negative.  Could be musculoskeletal in nature versus pain from her known mass.  Seems to be fairly well-controlled.  Will have her follow-up with her pulmonologist in the office.  2:57 PM:  I have discussed the diagnosis/risks/treatment options with the patient.  Evaluation and diagnostic testing in the emergency department does not suggest an emergent condition requiring admission or immediate intervention beyond what has been performed at this time.  They will follow up with Pulm. We also discussed returning to the ED immediately if new or worsening sx occur. We discussed the sx which are most concerning (e.g., sudden worsening pain, fever, inability to tolerate by mouth) that necessitate immediate return. Medications administered to the patient during their visit and any new prescriptions provided to the patient are listed below.  Medications given during this visit Medications - No data to display   The patient appears reasonably screen and/or stabilized for discharge and I doubt any other medical condition or other [facility] requiring further screening, evaluation, or treatment in the ED at this time prior to discharge.          Final Clinical Impression(s) / ED Diagnoses Final  diagnoses:  Chest wall pain    Rx / DC Orders ED Discharge Orders          Ordered    diclofenac Sodium (VOLTAREN) 1 % GEL  4 times daily        09/13/22 [place], [place], DO 09/13/22 1457

## 2022-09-13 NOTE — Discharge Instructions (Addendum)
Use the gel as prescribed. Also take tylenol '1000mg'$ (2 extra strength) four times a day.   Follow-up with your pulmonologist in the office.  Return for worsening pain difficulty breathing or if you pass out.

## 2022-09-13 NOTE — ED Notes (Signed)
Pt given discharge instructions and reviewed prescriptions. Opportunities given for questions. Pt verbalizes understanding. Leanne Chang, RN

## 2022-09-13 NOTE — Telephone Encounter (Signed)
Called and spoke with patient. She stated that she had just pulled into the parking lot of the ED at [facility]. I advised her that I would send a message to BQ to let him know. She is aware to let us know once she has been discharged.

## 2022-09-13 NOTE — ED Triage Notes (Signed)
Pt to ED c/o left upper abdominal and chest pain , also reports SHOB that' started 1 week ago, recently dx with lung mass. Received blood transfusion yesterday.

## 2022-09-14 ENCOUNTER — Telehealth: Payer: Self-pay | Admitting: Pulmonary Disease

## 2022-09-14 DIAGNOSIS — R918 Other nonspecific abnormal finding of lung field: Secondary | ICD-10-CM

## 2022-09-14 DIAGNOSIS — J45909 Unspecified asthma, uncomplicated: Secondary | ICD-10-CM

## 2022-09-14 NOTE — Telephone Encounter (Signed)
[employer] called asking for a Rx for the nebulizer to be changed because the diagnosis used on original order will not work for [employer] purposes. I placed a new order with a different diagnosis code and sent to [place] [facility] ). Nothing further needed at this time.

## 2022-09-14 NOTE — Telephone Encounter (Signed)
Julie Glassman, RN & I called pt's insurance & spoke to Dr. Franchot Gallo who states they are not able to authorize the PET scan at this time.  Recommends pt complete prescribed round of antibiotics & then a chest CT before another PET will be approved.  Also, they would not approve the PET because the last CXR done 09/13/22 showed no documented measurement growth of mass.  Pt is scheduled to see Dr. Roxan Hockey on 09/19/22 & he had recommended this PET be completed prior to this appointment.    Please recommend how we need to proceed regarding further imaging due to insurance denial of PET.

## 2022-09-14 NOTE — Telephone Encounter (Signed)
[employer] called to request an updated script for patient's nebulizer.  CB# [phone]

## 2022-09-15 ENCOUNTER — Ambulatory Visit (HOSPITAL_COMMUNITY): Payer: Medicaid Other

## 2022-09-15 NOTE — Telephone Encounter (Signed)
Pt sent mychart message about the PET scan that was originally scheduled for today 1/18 stating that it was requiring a peer-to-peer to be done otherwise it was going to have to be cancelled. Checked pt's chart and noticed that the PET was cancelled.  PCCs, do you have any information on this for [place]?

## 2022-09-15 NOTE — Telephone Encounter (Signed)
I called patient and let her know we have received 2 forms - one for FMLA and one for short term disability.

## 2022-09-16 NOTE — Telephone Encounter (Addendum)
Dr. Lake Bells- You will need to dial [phone] to complete a peer to peer phone call.  Please use the Tracking# [id number] when prompted & you will be connected to speak with a provider.   Below is a copy of the prior auth request & has pt's info listed if you should need it.     Thanks,  [employer]

## 2022-09-18 ENCOUNTER — Encounter: Payer: Self-pay | Admitting: Pulmonary Disease

## 2022-09-19 ENCOUNTER — Encounter: Payer: Self-pay | Admitting: Pulmonary Disease

## 2022-09-19 ENCOUNTER — Ambulatory Visit: Payer: Medicaid Other | Admitting: Thoracic Surgery (Cardiothoracic Vascular Surgery)

## 2022-09-20 ENCOUNTER — Ambulatory Visit (INDEPENDENT_AMBULATORY_CARE_PROVIDER_SITE_OTHER): Payer: Medicaid Other | Admitting: Thoracic Surgery (Cardiothoracic Vascular Surgery)

## 2022-09-20 ENCOUNTER — Telehealth: Payer: Self-pay | Admitting: Pulmonary Disease

## 2022-09-20 VITALS — BP 132/70 | HR 107 | Resp 18 | Ht 66.0 in | Wt 320.0 lb

## 2022-09-20 DIAGNOSIS — R918 Other nonspecific abnormal finding of lung field: Secondary | ICD-10-CM

## 2022-09-20 NOTE — H&P (View-Only) (Signed)
PCP is Songer, Alda Berthold, MD Referring Provider is Marshell Garfinkel, MD  Chief Complaint  Patient presents with   Follow-up    HPI: Ms. Stallworth returns to discuss the results of her left lung mass.  Julie King is a 32 year old woman with a history of tobacco abuse, morbid obesity, paroxysmal atrial fibrillation, and hypertension.  She presented back in October with hemoptysis and shortness of breath.  She initially was evaluated at [facility].  The CT showed a left lung mass in the lingula.  PET was done which showed the mass was hypermetabolic and there also was a hypermetabolic lymph node in the hilum.  She underwent bronchoscopy on 2 occasions.  Biopsies only showed inflammation.  There was no malignancy on either sampling.  She continues to have hemoptysis.  Also is having some musculoskeletal chest pain last week, which resulted in emergency room visit.  She is very frustrated by the lack of definitive answer and resolution.  Zubrod Score: At the time of surgery this patient's most appropriate activity status/level should be described as: '[]'$     0    Normal activity, no symptoms '[x]'$     1    Restricted in physical strenuous activity but ambulatory, able to do out light work '[]'$     2    Ambulatory and capable of self care, unable to do work activities, up and about >50 % of waking hours                              '[]'$     3    Only limited self care, in bed greater than 50% of waking hours '[]'$     4    Completely disabled, no self care, confined to bed or chair '[]'$     5    Moribund    Past Medical History:  Diagnosis Date   Atrial fibrillation ([place]) 2020   Family history of breast cancer    Headache 07/25/2017   Hypertension    Irritable bowel syndrome    Morbidly obese ([place]) 07/25/2017   QT prolongation    T wave inversion in EKG     Past Surgical History:  Procedure Laterality Date   BRONCHIAL BIOPSY  08/30/2022   Procedure: BRONCHIAL BIOPSIES;  Surgeon: Garner Nash, DO;   Location: [facility];  Service: Pulmonary;;   BRONCHIAL BRUSHINGS  08/30/2022   Procedure: BRONCHIAL BRUSHINGS;  Surgeon: Garner Nash, DO;  Location: [facility];  Service: Pulmonary;;   BRONCHIAL NEEDLE ASPIRATION BIOPSY  08/30/2022   Procedure: BRONCHIAL NEEDLE ASPIRATION BIOPSIES;  Surgeon: Garner Nash, DO;  Location: [facility];  Service: Pulmonary;;   CESAREAN SECTION     CESAREAN SECTION N/A 01/31/2020   Procedure: CESAREAN SECTION;  Surgeon: Chancy Milroy, MD;  Location: [facility];  Service: Obstetrics;  Laterality: N/A;   CHOLECYSTECTOMY     HERNIA REPAIR     TONSILLECTOMY      Family History  Problem Relation Age of Onset   Hypertension Other    Diabetes Other    Crohn's disease Mother    Hypertension Mother    Thyroid disease Father    Diabetes Father    Breast cancer Maternal Aunt 60   Breast cancer Paternal Grandmother        dx under 65   Breast cancer Other 37       MGMs sister, currently has stage IV    Social History  Social History   Tobacco Use   Smoking status: Every Day    Packs/day: 0.50    Years: 10.00    Total pack years: 5.00    Types: Cigarettes   Smokeless tobacco: Never   Tobacco comments:    Smokes about 1/2 pack per day 09/08/2021 PAP  Vaping Use   Vaping Use: Never used  Substance Use Topics   Alcohol use: Yes   Drug use: No    Current Outpatient Medications  Medication Sig Dispense Refill   albuterol (VENTOLIN HFA) 108 (90 Base) MCG/ACT inhaler Inhale 2 puffs into the lungs every 6 (six) hours as needed for wheezing or shortness of breath (Cough). 18 g 5   benzonatate (TESSALON) 200 MG capsule Take 1 capsule (200 mg total) by mouth 3 (three) times daily. 20 capsule 0   chlorpheniramine-HYDROcodone (TUSSIONEX) 10-8 MG/5ML Take 5 mLs by mouth every 12 (twelve) hours as needed for cough. 115 mL 0   diclofenac Sodium (VOLTAREN) 1 % GEL Apply 4 g topically 4 (four) times daily. 100 g 0   fluticasone (FLONASE) 50 MCG/ACT nasal  spray Place 1 spray into both nostrils daily. 47.4 mL 1   ipratropium-albuterol (DUONEB) 0.5-2.5 (3) MG/3ML SOLN Take 3 mLs by nebulization 4 (four) times daily. 360 mL 1   mometasone-formoterol (DULERA) 200-5 MCG/ACT AERO Inhale 2 puffs into the lungs 2 (two) times daily. 1 each 3   nicotine (NICODERM CQ - DOSED IN MG/24 HOURS) 14 mg/24hr patch Place 1 patch (14 mg total) onto the skin daily. 28 patch 1   predniSONE (DELTASONE) 10 MG tablet Take 2 tablets (20 mg total) by mouth daily with breakfast for 10 days, THEN 1 tablet (10 mg total) daily with breakfast for 10 days. 30 tablet 0   No current facility-administered medications for this visit.    Allergies  Allergen Reactions   Percocet [Oxycodone-Acetaminophen] Hives, Itching and Nausea Only   Pineapple Itching    Fresh pineapples; pineapples from a can is okay    Review of Systems  Constitutional:  Positive for activity change and fatigue.  HENT:  Negative for trouble swallowing and voice change.   Respiratory:  Positive for cough (Positive for hemoptysis).   Cardiovascular:  Positive for chest pain (Chest wall pain see HPI).  Gastrointestinal:  Negative for abdominal distention and abdominal pain.  Genitourinary:  Negative for difficulty urinating and dyspareunia.  Neurological:  Negative for seizures, syncope and weakness.  Hematological:  Bruises/bleeds easily.  All other systems reviewed and are negative.   BP 132/70 (BP Location: Right Arm, Patient Position: Sitting)   Pulse (!) 107   Resp 18   Ht '5\' 6"'$  (1.676 m)   Wt (!) 320 lb (145.2 kg)   LMP 08/29/2022 (Exact Date)   SpO2 99% Comment: RA  BMI 51.65 kg/m  Physical Exam Vitals reviewed.  Constitutional:      General: She is not in acute distress.    Appearance: She is obese.  HENT:     Head: Normocephalic and atraumatic.  Eyes:     General: No scleral icterus.    Extraocular Movements: Extraocular movements intact.  Cardiovascular:     Rate and Rhythm:  Normal rate and regular rhythm.     Heart sounds: Normal heart sounds. No murmur heard. Pulmonary:     Effort: Pulmonary effort is normal. No respiratory distress.     Breath sounds: Normal breath sounds. No wheezing or rales.  Abdominal:     General: There is  no distension.     Palpations: Abdomen is soft.  Musculoskeletal:     Cervical back: Neck supple.  Lymphadenopathy:     Cervical: No cervical adenopathy.  Skin:    General: Skin is warm and dry.  Neurological:     General: No focal deficit present.     Mental Status: She is oriented to person, place, and time.     Cranial Nerves: No cranial nerve deficit.     Motor: No weakness.     Diagnostic Tests: CT ANGIOGRAPHY CHEST WITH CONTRAST   TECHNIQUE: Multidetector CT imaging of the chest was performed using the standard protocol during bolus administration of intravenous contrast. Multiplanar CT image reconstructions and MIPs were obtained to evaluate the vascular anatomy.   RADIATION DOSE REDUCTION: This exam was performed according to the departmental dose-optimization program which includes automated exposure control, adjustment of the mA and/or kV according to patient size and/or use of iterative reconstruction technique.   CONTRAST:  75 mL OMNIPAQUE IOHEXOL 350 MG/ML SOLN   COMPARISON:  06/28/2021   FINDINGS: Cardiovascular: Satisfactory opacification of the pulmonary arteries to the segmental level. No evidence of pulmonary embolism. Normal heart size. No pericardial effusion.   Mediastinum/Nodes: No enlarged mediastinal, hilar, or axillary lymph nodes. Thyroid gland, trachea, and esophagus demonstrate no significant findings.   Lungs/Pleura: Spiculated lingular mass measures 3.4 x 2.8 x 2.3 cm. A neoplastic etiology is suspected. This finding was seen on a chest x-ray in October 2023. Pneumonia is less likely given this prolonged presence.   Upper Abdomen: No acute abnormality.   Musculoskeletal: No  chest wall abnormality. No acute or significant osseous findings.   Review of the MIP images confirms the above findings.   IMPRESSION: 1. No PE, aneurysm or dissection. 2. Left lung mass measuring 3.4 cm. PET scan recommended to evaluate for the presence of hypermetabolic activity, unless tissue diagnosis is obtained.     Electronically Signed   By: Sammie Bench M.D.   On: 08/28/2022 15:15 CHEST - 2 VIEW   COMPARISON:  Chest x-ray dated August 30, 2022.   FINDINGS: The heart size and mediastinal contours are within normal limits. Normal pulmonary vascularity. Unchanged round mass along the inferior margin of the left major fissure, predominantly within the lingula. No focal consolidation, pleural effusion, or pneumothorax. No acute osseous abnormality.   IMPRESSION: 1. No acute cardiopulmonary disease. 2. Unchanged lingular lung mass, previously biopsied.     Electronically Signed   By: Titus Dubin M.D.   On: 09/13/2022 13:40  I personally reviewed the CT and chest x-ray images again.  There is a 3.5 cm mass centered over the fissure involving the lingula and adjacent lower lobe.  To my reading there appears to be hilar and mediastinal adenopathy.  Impression: Cova Knieriem is a 32 year old woman with a history of tobacco abuse, morbid obesity, paroxysmal atrial fibrillation, and hypertension.  She has a 48-monthhistory of hemoptysis and a left lung mass.  Differential diagnosis includes primary bronchogenic carcinoma, infectious (atypical mycobacterial or fungal), and inflammatory (sarcoidosis).  She has features which are concerning for any of those and no definitive features that rules any of those in or out.  She has had 2 bronchoscopies which were negative for malignancy.  Given the size of the lesion, that is favorable but does not definitively rule out the possibility of malignancy.  There is also the issue of her ongoing hemoptysis.  It would be helpful  if we could obtain another PET/CT  but unfortunately we have not been able to get insurance approval.  My primary concern is that I want to proceed to resection if she has mediastinal adenopathy that needs to be investigated first.  There is some suspicion based on the CT done on 08/29/2023, but it is not definitive.  That would be helpful in that regard.  If you are unable to get a PET/CT repeat CT could at least tell us if there is been growth in that area as there has been growth in the lung mass.  We discussed the tentative plan to proceed with endobronchial ultrasound followed by robotic assisted left VATS for resection (wedge or multiple segmentectomy).  I informed her of the general nature of the procedure including the need for general anesthesia, the decision making, the incisions to be used, the use of the surgical robot, the use of drains to postoperatively, the expected hospital stay, and the overall recovery.  I informed her of the indications, risks, benefits, and alternatives.  She understands the risks include, but not limited to death, MI, DVT, PE, bleeding, possible need for transfusion, infection, prolonged air leak, cardiac arrhythmias, as well as possibility of other unforeseeable complications. She understands the degree of pain is unpredictable.  She accepts the risk and agrees to proceed.  She does have adequate pulmonary reserve to tolerate a resection.  Plan: Obtain PET/CT if insurance approves If not, repeat CT at 1 month from previous scan Tentatively plan for endobronchial ultrasound followed by robotic left VATS for wedge resection versus segmentectomy (both lobes) on Thursday, 10/06/2022  Melrose Nakayama, MD [facility] [phone]

## 2022-09-20 NOTE — Progress Notes (Signed)
PCP is Songer, Alda Berthold, MD Referring Provider is Marshell Garfinkel, MD  Chief Complaint  Patient presents with   Follow-up    HPI: Julie King returns to discuss the results of her left lung mass.  Julie King is a 32 year old woman with a history of tobacco abuse, morbid obesity, paroxysmal atrial fibrillation, and hypertension.  She presented back in October with hemoptysis and shortness of breath.  She initially was evaluated at [facility].  The CT showed a left lung mass in the lingula.  PET was done which showed the mass was hypermetabolic and there also was a hypermetabolic lymph node in the hilum.  She underwent bronchoscopy on 2 occasions.  Biopsies only showed inflammation.  There was no malignancy on either sampling.  She continues to have hemoptysis.  Also is having some musculoskeletal chest pain last week, which resulted in emergency room visit.  She is very frustrated by the lack of definitive answer and resolution.  Zubrod Score: At the time of surgery this patient's most appropriate activity status/level should be described as: '[]'$     0    Normal activity, no symptoms '[x]'$     1    Restricted in physical strenuous activity but ambulatory, able to do out light work '[]'$     2    Ambulatory and capable of self care, unable to do work activities, up and about >50 % of waking hours                              '[]'$     3    Only limited self care, in bed greater than 50% of waking hours '[]'$     4    Completely disabled, no self care, confined to bed or chair '[]'$     5    Moribund    Past Medical History:  Diagnosis Date   Atrial fibrillation ([place]) 2020   Family history of breast cancer    Headache 07/25/2017   Hypertension    Irritable bowel syndrome    Morbidly obese ([place]) 07/25/2017   QT prolongation    T wave inversion in EKG     Past Surgical History:  Procedure Laterality Date   BRONCHIAL BIOPSY  08/30/2022   Procedure: BRONCHIAL BIOPSIES;  Surgeon: Garner Nash, DO;   Location: [facility];  Service: Pulmonary;;   BRONCHIAL BRUSHINGS  08/30/2022   Procedure: BRONCHIAL BRUSHINGS;  Surgeon: Garner Nash, DO;  Location: [facility];  Service: Pulmonary;;   BRONCHIAL NEEDLE ASPIRATION BIOPSY  08/30/2022   Procedure: BRONCHIAL NEEDLE ASPIRATION BIOPSIES;  Surgeon: Garner Nash, DO;  Location: [facility];  Service: Pulmonary;;   CESAREAN SECTION     CESAREAN SECTION N/A 01/31/2020   Procedure: CESAREAN SECTION;  Surgeon: Chancy Milroy, MD;  Location: [facility];  Service: Obstetrics;  Laterality: N/A;   CHOLECYSTECTOMY     HERNIA REPAIR     TONSILLECTOMY      Family History  Problem Relation Age of Onset   Hypertension Other    Diabetes Other    Crohn's disease Mother    Hypertension Mother    Thyroid disease Father    Diabetes Father    Breast cancer Maternal Aunt 51   Breast cancer Paternal Grandmother        dx under 16   Breast cancer Other 28       MGMs sister, currently has stage IV    Social History  Social History   Tobacco Use   Smoking status: Every Day    Packs/day: 0.50    Years: 10.00    Total pack years: 5.00    Types: Cigarettes   Smokeless tobacco: Never   Tobacco comments:    Smokes about 1/2 pack per day 09/08/2021 PAP  Vaping Use   Vaping Use: Never used  Substance Use Topics   Alcohol use: Yes   Drug use: No    Current Outpatient Medications  Medication Sig Dispense Refill   albuterol (VENTOLIN HFA) 108 (90 Base) MCG/ACT inhaler Inhale 2 puffs into the lungs every 6 (six) hours as needed for wheezing or shortness of breath (Cough). 18 g 5   benzonatate (TESSALON) 200 MG capsule Take 1 capsule (200 mg total) by mouth 3 (three) times daily. 20 capsule 0   chlorpheniramine-HYDROcodone (TUSSIONEX) 10-8 MG/5ML Take 5 mLs by mouth every 12 (twelve) hours as needed for cough. 115 mL 0   diclofenac Sodium (VOLTAREN) 1 % GEL Apply 4 g topically 4 (four) times daily. 100 g 0   fluticasone (FLONASE) 50 MCG/ACT nasal  spray Place 1 spray into both nostrils daily. 47.4 mL 1   ipratropium-albuterol (DUONEB) 0.5-2.5 (3) MG/3ML SOLN Take 3 mLs by nebulization 4 (four) times daily. 360 mL 1   mometasone-formoterol (DULERA) 200-5 MCG/ACT AERO Inhale 2 puffs into the lungs 2 (two) times daily. 1 each 3   nicotine (NICODERM CQ - DOSED IN MG/24 HOURS) 14 mg/24hr patch Place 1 patch (14 mg total) onto the skin daily. 28 patch 1   predniSONE (DELTASONE) 10 MG tablet Take 2 tablets (20 mg total) by mouth daily with breakfast for 10 days, THEN 1 tablet (10 mg total) daily with breakfast for 10 days. 30 tablet 0   No current facility-administered medications for this visit.    Allergies  Allergen Reactions   Percocet [Oxycodone-Acetaminophen] Hives, Itching and Nausea Only   Pineapple Itching    Fresh pineapples; pineapples from a can is okay    Review of Systems  Constitutional:  Positive for activity change and fatigue.  HENT:  Negative for trouble swallowing and voice change.   Respiratory:  Positive for cough (Positive for hemoptysis).   Cardiovascular:  Positive for chest pain (Chest wall pain see HPI).  Gastrointestinal:  Negative for abdominal distention and abdominal pain.  Genitourinary:  Negative for difficulty urinating and dyspareunia.  Neurological:  Negative for seizures, syncope and weakness.  Hematological:  Bruises/bleeds easily.  All other systems reviewed and are negative.   BP 132/70 (BP Location: Right Arm, Patient Position: Sitting)   Pulse (!) 107   Resp 18   Ht '5\' 6"'$  (1.676 m)   Wt (!) 320 lb (145.2 kg)   LMP 08/29/2022 (Exact Date)   SpO2 99% Comment: RA  BMI 51.65 kg/m  Physical Exam Vitals reviewed.  Constitutional:      General: She is not in acute distress.    Appearance: She is obese.  HENT:     Head: Normocephalic and atraumatic.  Eyes:     General: No scleral icterus.    Extraocular Movements: Extraocular movements intact.  Cardiovascular:     Rate and Rhythm:  Normal rate and regular rhythm.     Heart sounds: Normal heart sounds. No murmur heard. Pulmonary:     Effort: Pulmonary effort is normal. No respiratory distress.     Breath sounds: Normal breath sounds. No wheezing or rales.  Abdominal:     General: There is  no distension.     Palpations: Abdomen is soft.  Musculoskeletal:     Cervical back: Neck supple.  Lymphadenopathy:     Cervical: No cervical adenopathy.  Skin:    General: Skin is warm and dry.  Neurological:     General: No focal deficit present.     Mental Status: She is oriented to person, place, and time.     Cranial Nerves: No cranial nerve deficit.     Motor: No weakness.     Diagnostic Tests: CT ANGIOGRAPHY CHEST WITH CONTRAST   TECHNIQUE: Multidetector CT imaging of the chest was performed using the standard protocol during bolus administration of intravenous contrast. Multiplanar CT image reconstructions and MIPs were obtained to evaluate the vascular anatomy.   RADIATION DOSE REDUCTION: This exam was performed according to the departmental dose-optimization program which includes automated exposure control, adjustment of the mA and/or kV according to patient size and/or use of iterative reconstruction technique.   CONTRAST:  75 mL OMNIPAQUE IOHEXOL 350 MG/ML SOLN   COMPARISON:  06/28/2021   FINDINGS: Cardiovascular: Satisfactory opacification of the pulmonary arteries to the segmental level. No evidence of pulmonary embolism. Normal heart size. No pericardial effusion.   Mediastinum/Nodes: No enlarged mediastinal, hilar, or axillary lymph nodes. Thyroid gland, trachea, and esophagus demonstrate no significant findings.   Lungs/Pleura: Spiculated lingular mass measures 3.4 x 2.8 x 2.3 cm. A neoplastic etiology is suspected. This finding was seen on a chest x-ray in October 2023. Pneumonia is less likely given this prolonged presence.   Upper Abdomen: No acute abnormality.   Musculoskeletal: No  chest wall abnormality. No acute or significant osseous findings.   Review of the MIP images confirms the above findings.   IMPRESSION: 1. No PE, aneurysm or dissection. 2. Left lung mass measuring 3.4 cm. PET scan recommended to evaluate for the presence of hypermetabolic activity, unless tissue diagnosis is obtained.     Electronically Signed   By: Sammie Bench M.D.   On: 08/28/2022 15:15 CHEST - 2 VIEW   COMPARISON:  Chest x-ray dated August 30, 2022.   FINDINGS: The heart size and mediastinal contours are within normal limits. Normal pulmonary vascularity. Unchanged round mass along the inferior margin of the left major fissure, predominantly within the lingula. No focal consolidation, pleural effusion, or pneumothorax. No acute osseous abnormality.   IMPRESSION: 1. No acute cardiopulmonary disease. 2. Unchanged lingular lung mass, previously biopsied.     Electronically Signed   By: Titus Dubin M.D.   On: 09/13/2022 13:40  I personally reviewed the CT and chest x-ray images again.  There is a 3.5 cm mass centered over the fissure involving the lingula and adjacent lower lobe.  To my reading there appears to be hilar and mediastinal adenopathy.  Impression: Julie King is a 32 year old woman with a history of tobacco abuse, morbid obesity, paroxysmal atrial fibrillation, and hypertension.  She has a 81-monthhistory of hemoptysis and a left lung mass.  Differential diagnosis includes primary bronchogenic carcinoma, infectious (atypical mycobacterial or fungal), and inflammatory (sarcoidosis).  She has features which are concerning for any of those and no definitive features that rules any of those in or out.  She has had 2 bronchoscopies which were negative for malignancy.  Given the size of the lesion, that is favorable but does not definitively rule out the possibility of malignancy.  There is also the issue of her ongoing hemoptysis.  It would be helpful  if we could obtain another PET/CT  but unfortunately we have not been able to get insurance approval.  My primary concern is that I want to proceed to resection if she has mediastinal adenopathy that needs to be investigated first.  There is some suspicion based on the CT done on 08/29/2023, but it is not definitive.  That would be helpful in that regard.  If you are unable to get a PET/CT repeat CT could at least tell us if there is been growth in that area as there has been growth in the lung mass.  We discussed the tentative plan to proceed with endobronchial ultrasound followed by robotic assisted left VATS for resection (wedge or multiple segmentectomy).  I informed her of the general nature of the procedure including the need for general anesthesia, the decision making, the incisions to be used, the use of the surgical robot, the use of drains to postoperatively, the expected hospital stay, and the overall recovery.  I informed her of the indications, risks, benefits, and alternatives.  She understands the risks include, but not limited to death, MI, DVT, PE, bleeding, possible need for transfusion, infection, prolonged air leak, cardiac arrhythmias, as well as possibility of other unforeseeable complications. She understands the degree of pain is unpredictable.  She accepts the risk and agrees to proceed.  She does have adequate pulmonary reserve to tolerate a resection.  Plan: Obtain PET/CT if insurance approves If not, repeat CT at 1 month from previous scan Tentatively plan for endobronchial ultrasound followed by robotic left VATS for wedge resection versus segmentectomy (both lobes) on Thursday, 10/06/2022  Melrose Nakayama, MD [facility] [phone]

## 2022-09-20 NOTE — Telephone Encounter (Signed)
Case discussed in peer to peer with the patient's insurance company. I explained that over the course of the last several months Julie King has experienced an enlarging left upper lobe nodule which has been biopsied with bronchoscopy twice now and has no malignant serial was obtained but unfortunately the lesion continues to grow.  Specifically it has grown from 1.5 cm over 3 cm in size and now involves the right upper lobe and the right lower lobe.  I have attempted to treat her with prednisone as well as Augmentin to see if she will have resolution of symptoms but unfortunately the patient continues to complain of chest pain, shortness of breath and hemoptysis and has had no benefit from these medications.  We are requesting a repeat PET scan to look for evidence of metastatic disease prior to segmentectomy/resection.  This note is being dictated for insurance purposes to hopefully expedite approval for a repeat PET.

## 2022-09-21 ENCOUNTER — Other Ambulatory Visit: Payer: Self-pay | Admitting: *Deleted

## 2022-09-21 ENCOUNTER — Encounter: Payer: Self-pay | Admitting: *Deleted

## 2022-09-21 DIAGNOSIS — R918 Other nonspecific abnormal finding of lung field: Secondary | ICD-10-CM

## 2022-09-22 ENCOUNTER — Telehealth: Payer: Self-pay | Admitting: Adult Health

## 2022-09-22 ENCOUNTER — Telehealth: Payer: Self-pay | Admitting: Pulmonary Disease

## 2022-09-22 NOTE — Telephone Encounter (Signed)
Patient dropped off 2 forms - FMLA and Disability.   I have spoken to her several times and filled in the forms.  I will have Dr. Lake Bells sign the forms when he comes into the office on 1/26.  Forms need to be faxed to the patient's employer on 1/26 in order for her to get paid.  Disability will run until 12/05/2022 - which 2 months after her surgery scheduled for 10/06/2022.  FMLA was estimated for 18 months to cover flare-ups and doctor appointments.  A copy of the signed forms will be emailed to the patient.

## 2022-09-22 NOTE — Telephone Encounter (Signed)
Patient called in with left sided chest wall pain, waxing and waning since this morning , describes a " charlie horse" squeezing sensation . Comes and goes. Has ongoing hemoptysis , no change. Previous bx neg for malignancy. Repeat PET scan is pending .  Advised that will need to ER with Chest pain and ongoing hemoptysis .  She declines at this time, education given.  May try tylenol , heat to ribs, cough control meds.  Call office in am for in person visit with chest xray   Please contact office for sooner follow up if symptoms do not improve or worsen or seek emergency care   Triage please call to make ov tomorrow

## 2022-09-23 ENCOUNTER — Encounter: Payer: Medicaid Other | Admitting: Thoracic Surgery (Cardiothoracic Vascular Surgery)

## 2022-09-23 NOTE — Telephone Encounter (Signed)
Extra encounter open. Will close so we don't have so many messages open.

## 2022-09-23 NOTE — Telephone Encounter (Signed)
Looked at the schedule for today. We do not have any openings with any of the providers.   BQ, do you want her to come in just for a CXR?

## 2022-09-23 NOTE — Telephone Encounter (Signed)
Called patient. She did not answer. Left message for her to call us back.

## 2022-09-23 NOTE — Telephone Encounter (Signed)
Dr. Lake Bells signed the FMLA and disability forms.  Both were faxed to [employer] fax# [phone].  Disability fax also included hospital d/c notes and ov notes since 08/29/2022.  Hard copy of forms emailed to patient.

## 2022-09-23 NOTE — Telephone Encounter (Signed)
Mychart message sent by pt: Julie King [facility] (supporting Juanito Doom, MD)19 hours ago (3:03 PM)    I am having a"Charlie horse" like discomfort under my left breast. Is this something I need to worry about?      Message sent to pt recommending her to check with either PCP or GYN since it was under her breast but she then sent another message stating that it is actually under her rib and wants to know if you can provide any recommendations.  Julie King, please advise.

## 2022-09-26 ENCOUNTER — Encounter: Payer: Self-pay | Admitting: Thoracic Surgery (Cardiothoracic Vascular Surgery)

## 2022-09-27 ENCOUNTER — Encounter: Payer: Self-pay | Admitting: Pulmonary Disease

## 2022-09-28 ENCOUNTER — Ambulatory Visit: Payer: Self-pay | Admitting: Pulmonary Disease

## 2022-09-28 ENCOUNTER — Other Ambulatory Visit: Payer: Self-pay | Admitting: Pulmonary Disease

## 2022-09-28 ENCOUNTER — Telehealth: Payer: Self-pay | Admitting: Pulmonary Disease

## 2022-09-28 MED ORDER — HYDROCOD POLI-CHLORPHE POLI ER 10-8 MG/5ML PO SUER: 5.0000 mL | Freq: Two times a day (BID) | ORAL | 0 refills | Status: DC | PRN

## 2022-09-28 NOTE — Telephone Encounter (Signed)
I have copied previous message with the P2P info for you.  Dr. Lake Bells- You will need to dial [phone] to complete a peer to peer phone call.  Please use the Tracking# [id number] when prompted & you will be connected to speak with a provider.   Below is a copy of the prior auth request & has pt's info listed if you should need it.

## 2022-09-28 NOTE — Telephone Encounter (Signed)
Pt has been scheduled for 2/7 at 9:30 AM checking in by 9 AM at [facility].  Water only beginning 6 hrs prior - no gum, sugars.  The day before no carbs (breads, pastas, etc) but proteins, beans & greens are best.

## 2022-09-28 NOTE — Telephone Encounter (Signed)
Dr. McQuaid, please see mychart messages sent and advise. 

## 2022-09-29 LAB — FUNGUS CULTURE WITH STAIN

## 2022-09-29 LAB — FUNGAL ORGANISM REFLEX

## 2022-09-29 LAB — FUNGUS CULTURE RESULT

## 2022-09-30 ENCOUNTER — Encounter (HOSPITAL_BASED_OUTPATIENT_CLINIC_OR_DEPARTMENT_OTHER): Payer: Self-pay

## 2022-09-30 ENCOUNTER — Emergency Department (HOSPITAL_BASED_OUTPATIENT_CLINIC_OR_DEPARTMENT_OTHER): Payer: Medicaid Other

## 2022-09-30 ENCOUNTER — Other Ambulatory Visit: Payer: Self-pay

## 2022-09-30 ENCOUNTER — Emergency Department (HOSPITAL_BASED_OUTPATIENT_CLINIC_OR_DEPARTMENT_OTHER)
Admission: EM | Admit: 2022-09-30 | Discharge: 2022-09-30 | Disposition: A | Discharge status: Home / Self Care | Payer: Medicaid Other | Attending: Emergency Medicine | Admitting: Emergency Medicine

## 2022-09-30 ENCOUNTER — Emergency Department (HOSPITAL_BASED_OUTPATIENT_CLINIC_OR_DEPARTMENT_OTHER): Payer: Medicaid Other | Admitting: Radiology

## 2022-09-30 DIAGNOSIS — I10 Essential (primary) hypertension: Secondary | ICD-10-CM | POA: Diagnosis not present

## 2022-09-30 DIAGNOSIS — F17210 Nicotine dependence, cigarettes, uncomplicated: Secondary | ICD-10-CM | POA: Diagnosis not present

## 2022-09-30 DIAGNOSIS — R918 Other nonspecific abnormal finding of lung field: Secondary | ICD-10-CM | POA: Insufficient documentation

## 2022-09-30 DIAGNOSIS — I4891 Unspecified atrial fibrillation: Secondary | ICD-10-CM | POA: Diagnosis not present

## 2022-09-30 DIAGNOSIS — R0781 Pleurodynia: Secondary | ICD-10-CM | POA: Insufficient documentation

## 2022-09-30 DIAGNOSIS — R079 Chest pain, unspecified: Secondary | ICD-10-CM | POA: Diagnosis present

## 2022-09-30 LAB — CBC
HCT: 34.4 % — ABNORMAL LOW (ref 36.0–46.0)
Hemoglobin: 10.4 g/dL — ABNORMAL LOW (ref 12.0–15.0)
MCH: 17.8 pg — ABNORMAL LOW (ref 26.0–34.0)
MCHC: 30.2 g/dL (ref 30.0–36.0)
MCV: 59 fL — ABNORMAL LOW (ref 80.0–100.0)
Platelets: 303 10*3/uL (ref 150–400)
RBC: 5.83 MIL/uL — ABNORMAL HIGH (ref 3.87–5.11)
RDW: 22.5 % — ABNORMAL HIGH (ref 11.5–15.5)
WBC: 12.5 10*3/uL — ABNORMAL HIGH (ref 4.0–10.5)
nRBC: 0 % (ref 0.0–0.2)

## 2022-09-30 LAB — BASIC METABOLIC PANEL
Anion gap: 8 (ref 5–15)
BUN: 9 mg/dL (ref 6–20)
CO2: 27 mmol/L (ref 22–32)
Calcium: 9.2 mg/dL (ref 8.9–10.3)
Chloride: 106 mmol/L (ref 98–111)
Creatinine, Ser: 0.71 mg/dL (ref 0.44–1.00)
GFR, Estimated: >60 mL/min (ref >60)
Glucose, Bld: 121 mg/dL — ABNORMAL HIGH (ref 70–99)
Potassium: 3.4 mmol/L — ABNORMAL LOW (ref 3.5–5.1)
Sodium: 141 mmol/L (ref 135–145)

## 2022-09-30 MED ORDER — ACETAMINOPHEN 500 MG PO TABS
1000.0000 mg | ORAL_TABLET | Freq: Once | ORAL | Status: AC
  Administered 2022-09-30: 1000 mg via ORAL
  Filled 2022-09-30: qty 2

## 2022-09-30 MED ORDER — IOHEXOL 350 MG/ML SOLN
100.0000 mL | Freq: Once | INTRAVENOUS | Status: AC | PRN
  Administered 2022-09-30: 100 mL via INTRAVENOUS

## 2022-09-30 MED ORDER — LACTATED RINGERS IV BOLUS
1000.0000 mL | Freq: Once | INTRAVENOUS | Status: AC
  Administered 2022-09-30: 1000 mL via INTRAVENOUS

## 2022-09-30 NOTE — ED Provider Notes (Signed)
[facility] Provider Note  CSN: [id number] Arrival date & time: 09/30/22 1408  Chief Complaint(s) Chest Pain  HPI Julie King is a 32 y.o. 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  She reports the pain is pleuritic, feels like a "spasm".  She reports that she also has some shortness of breath.  She has had some occasional coughing with bloody sputum, which has been going on since she was diagnosed with a mass.  No recent travel or surgeries.  No fevers or chills.  No nausea or vomiting.  No abdominal pain.  Symptoms are constant since yesterday.   Past Medical History Past Medical History:  Diagnosis Date   Atrial fibrillation ([place]) 2020   Family history of breast cancer    Headache 07/25/2017   Hypertension    Irritable bowel syndrome    Morbidly obese ([place]) 07/25/2017   QT prolongation    T wave inversion in EKG    Patient Active Problem List   Diagnosis Date Noted   Hemoptysis 08/28/2022   Lung mass 08/28/2022   Family history of breast cancer    Essential hypertension 03/16/2020   History of atrial fibrillation 11/28/2019   Headache 07/25/2017   Morbidly obese ([place]) 07/25/2017   Home Medication(s) Prior to Admission medications   Medication Sig Start Date End Date Taking? Authorizing Provider  albuterol (VENTOLIN HFA) 108 (90 Base) MCG/ACT inhaler Inhale 2 puffs into the lungs every 6 (six) hours as needed for wheezing or shortness of breath (Cough). 06/14/22   Lynden Oxford Scales, PA-C  benzonatate (TESSALON) 200 MG capsule Take 1 capsule (200 mg total) by mouth 3 (three) times daily. Patient not taking: Reported on 09/29/2022 09/01/22   Minor, Grace Bushy, NP  chlorpheniramine-HYDROcodone (TUSSIONEX) 10-8 MG/5ML Take 5 mLs by mouth every 12 (twelve) hours as needed for cough. Patient not taking: Reported on  09/29/2022 09/01/22   Minor, Grace Bushy, NP  chlorpheniramine-HYDROcodone (TUSSIONEX) 10-8 MG/5ML Take 5 mLs by mouth every 12 (twelve) hours as needed for cough. 09/28/22   Juanito Doom, MD  diclofenac Sodium (VOLTAREN) 1 % GEL Apply 4 g topically 4 (four) times daily. Patient not taking: Reported on 09/29/2022 09/13/22   Deno Etienne, DO  fluticasone [facility]) 50 MCG/ACT nasal spray Place 1 spray into both nostrils daily. Patient not taking: Reported on 09/29/2022 06/14/22   Lynden Oxford Scales, PA-C  ipratropium-albuterol (DUONEB) 0.5-2.5 (3) MG/3ML SOLN Take 3 mLs by nebulization 4 (four) times daily. Patient taking differently: Take 3 mLs by nebulization every 6 (six) hours as needed (shortness of breath). 09/01/22   Minor, Grace Bushy, NP  mometasone-formoterol (DULERA) 200-5 MCG/ACT AERO Inhale 2 puffs into the lungs 2 (two) times daily. 09/01/22   Minor, Grace Bushy, NP  nicotine (NICODERM CQ - DOSED IN MG/24 HOURS) 14 mg/24hr patch Place 1 patch (14 mg total) onto the skin daily. Patient taking differently: Place 14 mg onto the skin daily as needed (smoking cessation). 09/06/22   Juanito Doom, MD  Past Surgical History Past Surgical History:  Procedure Laterality Date   BRONCHIAL BIOPSY  08/30/2022   Procedure: BRONCHIAL BIOPSIES;  Surgeon: Garner Nash, DO;  Location: [facility];  Service: Pulmonary;;   BRONCHIAL BRUSHINGS  08/30/2022   Procedure: BRONCHIAL BRUSHINGS;  Surgeon: Garner Nash, DO;  Location: [facility];  Service: Pulmonary;;   BRONCHIAL NEEDLE ASPIRATION BIOPSY  08/30/2022   Procedure: BRONCHIAL NEEDLE ASPIRATION BIOPSIES;  Surgeon: Garner Nash, DO;  Location: [facility];  Service: Pulmonary;;   CESAREAN SECTION     CESAREAN SECTION N/A 01/31/2020   Procedure: CESAREAN SECTION;  Surgeon: Chancy Milroy, MD;  Location: [facility];   Service: Obstetrics;  Laterality: N/A;   CHOLECYSTECTOMY     HERNIA REPAIR     TONSILLECTOMY     Family History Family History  Problem Relation Age of Onset   Hypertension Other    Diabetes Other    Crohn's disease Mother    Hypertension Mother    Thyroid disease Father    Diabetes Father    Breast cancer Maternal Aunt 56   Breast cancer Paternal Grandmother        dx under 75   Breast cancer Other 34       MGMs sister, currently has stage IV    Social History Social History   Tobacco Use   Smoking status: Every Day    Packs/day: 0.50    Years: 10.00    Total pack years: 5.00    Types: Cigarettes   Smokeless tobacco: Never   Tobacco comments:    Smokes about 1/2 pack per day 09/08/2021 PAP  Vaping Use   Vaping Use: Never used  Substance Use Topics   Alcohol use: Yes   Drug use: No   Allergies Percocet [oxycodone-acetaminophen] and Pineapple  Review of Systems Review of Systems  All other systems reviewed and are negative.   Physical Exam Vital Signs  I have reviewed the triage vital signs BP 123/61   Pulse 75   Temp 97.8 F (36.6 C)   Resp (!) 23   LMP 09/30/2022 (Exact Date)   SpO2 97%  Physical Exam Vitals and nursing note reviewed.  Constitutional:      General: She is not in acute distress.    Appearance: She is well-developed.  HENT:     Head: Normocephalic and atraumatic.     Mouth/Throat:     Mouth: Mucous membranes are moist.  Eyes:     Pupils: Pupils are equal, round, and reactive to light.  Cardiovascular:     Rate and Rhythm: Regular rhythm. Tachycardia present.     Heart sounds: No murmur heard. Pulmonary:     Effort: Pulmonary effort is normal. No respiratory distress.     Breath sounds: Examination of the left-lower field reveals rales. Rales present. No wheezing or rhonchi.  Abdominal:     General: Abdomen is flat.     Palpations: Abdomen is soft.     Tenderness: There is no abdominal tenderness.  Musculoskeletal:         General: No tenderness.     Right lower leg: No edema.     Left lower leg: No edema.  Skin:    General: Skin is warm and dry.  Neurological:     General: No focal deficit present.     Mental Status: She is alert. Mental status is at baseline.  Psychiatric:        Mood and Affect: Mood normal.  Behavior: Behavior normal.     ED Results and Treatments Labs (all labs ordered are listed, but only abnormal results are displayed) Labs Reviewed  BASIC METABOLIC PANEL - Abnormal; Notable for the following components:      Result Value   Potassium 3.4 (*)    Glucose, Bld 121 (*)    All other components within normal limits  CBC - Abnormal; Notable for the following components:   WBC 12.5 (*)    RBC 5.83 (*)    Hemoglobin 10.4 (*)    HCT 34.4 (*)    MCV 59.0 (*)    MCH 17.8 (*)    RDW 22.5 (*)    All other components within normal limits  PREGNANCY, URINE                                                                                                                          Radiology DG Chest 2 View  Result Date: 09/30/2022 CLINICAL DATA:  SOB EXAM: CHEST - 2 VIEW COMPARISON:  Chest x-ray 09/13/2022. FINDINGS: Interval decrease in size of a lingular mass, which now demonstrates gas suggestive of cavitation/necrosis. Otherwise, lungs are clear. Cardiomediastinal silhouette is within normal limits. IMPRESSION: 1. Interval decrease in size of a lingular mass, which now demonstrates gas suggestive of cavitation/necrosis. CT chest with contrast could further evaluate if clinically warranted. 2. Otherwise, no acute cardiopulmonary disease. Electronically Signed   By: Margaretha Sheffield M.D.   On: 09/30/2022 14:34    Pertinent labs & imaging results that were available during my care of the patient were reviewed by me and considered in my medical decision making (see MDM for details).  Medications Ordered in ED Medications  acetaminophen (TYLENOL) tablet 1,000 mg (1,000 mg Oral Given  09/30/22 1531)  iohexol (OMNIPAQUE) 350 MG/ML injection 100 mL (100 mLs Intravenous Contrast Given 09/30/22 1542)  iohexol (OMNIPAQUE) 350 MG/ML injection 100 mL (100 mLs Intravenous Contrast Given 09/30/22 1558)  lactated ringers bolus 1,000 mL (1,000 mLs Intravenous New Bag/Given 09/30/22 1735)                                                                                                                                     Procedures Procedures  (including critical care time)  Medical Decision Making / ED Course   MDM:  32 year old female presenting to the emergency department with chest pain.  Patient well-appearing,  physical exam notable for mild tachycardia.  She has trace rhonchi in left lower lung field.  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  Suspect most likely cause of patient's pain is related to mass, may have some pleural irritation.  On previous workup it is unclear whether or not this mass is malignancy or not.  She is following with thoracic surgery and pulmonology for this.  Given worsening pain, tachycardia, will obtain CT pulmonary scan to evaluate for pulmonary embolism or change in tumor.  No hemoptysis at this time.  No fevers or chills, productive cough to suggest pneumonia.  Very low concern for ACS, symptoms atypical and pleuritic will check EKG.  Will reassess.  Will treat pain with Tylenol.  Clinical Course as of 09/30/22 1808  Ludwig Clarks Sep 30, 2022  1802 CT timing suboptimal but does show lung mass near the lung border adjacent to the pleura which is likely causing the patient's symptoms.  No large central PE.  Hemoglobin reassuring and stable.  There is some layering fluid but patient has no cough, fevers, chills to suggest abscess.  Advised continuing to follow-up with pulmonology and thoracic surgery for PET scan and ultimately resection of mass. [WS]    Clinical Course User Index [WS] Cristie Hem, MD     Additional history obtained: -External records from  outside source obtained and reviewed including: Chart review including previous notes, labs, imaging, consultation notes including prior records from Dr. Roxan Hockey and pulmonologist   Lab Tests: -I ordered, reviewed, and interpreted labs.   The pertinent results include:   Labs Reviewed  BASIC METABOLIC PANEL - Abnormal; Notable for the following components:      Result Value   Potassium 3.4 (*)    Glucose, Bld 121 (*)    All other components within normal limits  CBC - Abnormal; Notable for the following components:   WBC 12.5 (*)    RBC 5.83 (*)    Hemoglobin 10.4 (*)    HCT 34.4 (*)    MCV 59.0 (*)    MCH 17.8 (*)    RDW 22.5 (*)    All other components within normal limits  PREGNANCY, URINE    Notable for mild leukocytosis, stable hemoglobin  EKG  Reviewed by me, sinus tachycardia with borderline T wave abnormalities which appears similar to previous EKGs    Imaging Studies ordered: I ordered imaging studies including CTA chest On my interpretation imaging demonstrates known lung mass, no large central PE I independently visualized and interpreted imaging. I agree with the radiologist interpretation   Medicines ordered and prescription drug management: Meds ordered this encounter  Medications   acetaminophen (TYLENOL) tablet 1,000 mg   iohexol (OMNIPAQUE) 350 MG/ML injection 100 mL   iohexol (OMNIPAQUE) 350 MG/ML injection 100 mL   lactated ringers bolus 1,000 mL    -I have reviewed the patients home medicines and have made adjustments as needed   Social Determinants of Health:  Diagnosis or treatment significantly limited by social determinants of health: obesity   Reevaluation: After the interventions noted above, I reevaluated the patient and found that they have improved  Co morbidities that complicate the patient evaluation  Past Medical History:  Diagnosis Date   Atrial fibrillation ([place]) 2020   Family history of breast cancer    Headache  07/25/2017   Hypertension    Irritable bowel syndrome    Morbidly obese ([place]) 07/25/2017   QT prolongation    T wave inversion in EKG  Dispostion: Disposition decision including need for hospitalization was considered, and patient discharged from emergency department.    Final Clinical Impression(s) / ED Diagnoses Final diagnoses:  Pleuritic chest pain  Lung mass     This chart was dictated using voice recognition software.  Despite best efforts to proofread,  errors can occur which can change the documentation meaning.    Cristie Hem, MD 09/30/22 [phone]

## 2022-09-30 NOTE — Telephone Encounter (Signed)
Dr Lake Bells,  please see most recent contact from patient.   Julie King  P [facility] (supporting Juanito Doom, MD)3 minutes ago (11:07 AM)    I am having ver sharp pains on the left side of my chest  also when I lay on that side . This is new in the pass it was a spasm feeling or dull pain , never sharp like now     Please advise.

## 2022-09-30 NOTE — Discharge Instructions (Signed)
We evaluated you for your chest pain.  Your CT scan shows that the lung mass is adjacent to the lining of your lung.  This can cause irritation whenever you are breathing or lying on your left side.  Please follow-up closely with Dr. Roxan Hockey and your pulmonologist to have the lung mass removed.  Please take Tylenol and Motrin for your symptoms at home.  You can take 650 mg of Tylenol every 6 hours and 600 mg of ibuprofen every 6 hours as needed for your symptoms.  You can take these medicines together as needed, either at the same time, or alternating every 3 hours.  Please return to the emergency department if you develop any fevers or chills, productive cough, worsening pain, lightheadedness or dizziness, coughing up large amounts of blood, or any other concerning symptoms.

## 2022-09-30 NOTE — ED Triage Notes (Signed)
Pt presents POV with ongoing pain to her Left lung since last night. Pt reports a hx of a mass on her Left lung. Pt describes it as a "spasm"   Pt NAD, speaking complete sentences, ambulatory on arrival

## 2022-09-30 NOTE — ED Notes (Signed)
Crackers and ginger ale given to pt

## 2022-10-03 NOTE — Pre-Procedure Instructions (Signed)
Surgical Instructions    Your procedure is scheduled on Thursday, February 8.  Report to [facility] Main Entrance "A" at 5:30 A.M., then check in with the Admitting office.  Call this number if you have problems the morning of surgery:  [phone]   If you have any questions prior to your surgery date call [phone]: Open Monday-Friday 8am-4pm If you experience any cold or flu symptoms such as cough, fever, chills, shortness of breath, etc. between now and your scheduled surgery, please notify us at the above number     Remember:  Do not eat or drink after midnight the night before your surgery     Take these medicines the morning of surgery:  mometasone-formoterol (DULERA) 200-5 MCG/ACT AERO   IF NEEDED: albuterol (VENTOLIN HFA) 108 (90 Base) MCG/ACT inhaler -Please bring all inhalers with you the day of surgery.  ipratropium-albuterol (DUONEB) 0.5-2.5 (3) MG/3ML SOLN    As of today, STOP taking any Aspirin (unless otherwise instructed by your surgeon) Aleve, Naproxen, Ibuprofen, Motrin, Advil, Goody's, BC's, all herbal medications, fish oil, and all vitamins.            [facility] is not responsible for any belongings or valuables.    Do NOT Smoke (Tobacco/Vaping)  24 hours prior to your procedure  If you use a CPAP at night, you may bring your mask for your overnight stay.   Contacts, glasses, hearing aids, dentures or partials may not be worn into surgery, please bring cases for these belongings   For patients admitted to the hospital, discharge time will be determined by your treatment team.   Patients discharged the day of surgery will not be allowed to drive home, and someone needs to stay with them for 24 hours.   SURGICAL WAITING ROOM VISITATION Patients having surgery or a procedure may have no more than 2 support people in the waiting area - these visitors may rotate.   Children under the age of 45 must have an adult with them who is not the patient. If  the patient needs to stay at the hospital during part of their recovery, the visitor guidelines for inpatient rooms apply. Pre-op nurse will coordinate an appropriate time for 1 support person to accompany patient in pre-op.  This support person may not rotate.   Please refer to [email or web address].hu for the visitor guidelines for Inpatients (after your surgery is over and you are in a regular room).    Special instructions:    Oral Hygiene is also important to reduce your risk of infection.  Remember - BRUSH YOUR TEETH THE MORNING OF SURGERY WITH YOUR REGULAR TOOTHPASTE   [facility]- Preparing For Surgery  Before surgery, you can play an important role. Because skin is not sterile, your skin needs to be as free of germs as possible. You can reduce the number of germs on your skin by washing with CHG (chlorahexidine gluconate) Soap before surgery.  CHG is an antiseptic cleaner which kills germs and bonds with the skin to continue killing germs even after washing.     Please do not use if you have an allergy to CHG or antibacterial soaps. If your skin becomes reddened/irritated stop using the CHG.  Do not shave (including legs and underarms) for at least 48 hours prior to first CHG shower. It is OK to shave your face.  Please follow these instructions carefully.     Shower the NIGHT BEFORE SURGERY and the MORNING OF SURGERY with CHG Soap.  If you chose to wash your hair, wash your hair first as usual with your normal shampoo. After you shampoo, rinse your hair and body thoroughly to remove the shampoo.  Then [employer] and genitals (private parts) with your normal soap and rinse thoroughly to remove soap.  After that Use CHG Soap as you would any other liquid soap. You can apply CHG directly to the skin and wash gently with a scrungie or a clean washcloth.   Apply the CHG Soap to your body ONLY FROM THE NECK DOWN.  Do not use on open wounds  or open sores. Avoid contact with your eyes, ears, mouth and genitals (private parts). Wash Face and genitals (private parts)  with your normal soap.   Wash thoroughly, paying special attention to the area where your surgery will be performed.  Thoroughly rinse your body with warm water from the neck down.  DO NOT shower/wash with your normal soap after using and rinsing off the CHG Soap.  Pat yourself dry with a CLEAN TOWEL.  Wear CLEAN PAJAMAS to bed the night before surgery  Place CLEAN SHEETS on your bed the night before your surgery  DO NOT SLEEP WITH PETS.   Day of Surgery:  Take a shower with CHG soap. Wear Clean/Comfortable clothing the morning of surgery Do not wear jewelry or makeup. Do not wear lotions, powders, perfumes/cologne or deodorant. Do not shave 48 hours prior to surgery.  Men may shave face and neck. Do not bring valuables to the hospital. Do not wear nail polish, gel polish, artificial nails, or any other type of covering on natural nails (fingers and toes) If you have artificial nails or gel coating that need to be removed by a nail salon, please have this removed prior to surgery. Artificial nails or gel coating may interfere with anesthesia's ability to adequately monitor your vital signs. Remember to brush your teeth WITH YOUR REGULAR TOOTHPASTE.    If you received a COVID test during your pre-op visit, it is requested that you wear a mask when out in public, stay away from anyone that may not be feeling well, and notify your surgeon if you develop symptoms. If you have been in contact with anyone that has tested positive in the last 10 days, please notify your surgeon.    Please read over the following fact sheets that you were given.

## 2022-10-04 ENCOUNTER — Encounter: Payer: Self-pay | Admitting: Pulmonary Disease

## 2022-10-04 ENCOUNTER — Ambulatory Visit (HOSPITAL_COMMUNITY)
Admission: RE | Admit: 2022-10-04 | Discharge: 2022-10-04 | Disposition: A | Discharge status: Home / Self Care | Payer: Medicaid Other | Source: Ambulatory Visit | Attending: Thoracic Surgery (Cardiothoracic Vascular Surgery) | Admitting: Thoracic Surgery (Cardiothoracic Vascular Surgery)

## 2022-10-04 ENCOUNTER — Other Ambulatory Visit: Payer: Self-pay

## 2022-10-04 ENCOUNTER — Ambulatory Visit (INDEPENDENT_AMBULATORY_CARE_PROVIDER_SITE_OTHER): Payer: Medicaid Other | Admitting: Pulmonary Disease

## 2022-10-04 ENCOUNTER — Encounter (HOSPITAL_COMMUNITY)
Admission: RE | Admit: 2022-10-04 | Discharge: 2022-10-04 | Disposition: A | Discharge status: Home / Self Care | Payer: Medicaid Other | Source: Ambulatory Visit | Attending: Thoracic Surgery (Cardiothoracic Vascular Surgery) | Admitting: Thoracic Surgery (Cardiothoracic Vascular Surgery)

## 2022-10-04 ENCOUNTER — Other Ambulatory Visit: Payer: Self-pay | Admitting: *Deleted

## 2022-10-04 ENCOUNTER — Encounter (HOSPITAL_COMMUNITY): Payer: Self-pay

## 2022-10-04 VITALS — BP 124/80 | HR 92 | Temp 98.2°F | Ht 66.0 in | Wt 322.4 lb

## 2022-10-04 VITALS — BP 139/77 | HR 95 | Temp 97.7°F | Resp 19 | Ht 67.0 in | Wt 320.8 lb

## 2022-10-04 DIAGNOSIS — R042 Hemoptysis: Secondary | ICD-10-CM | POA: Diagnosis not present

## 2022-10-04 DIAGNOSIS — R918 Other nonspecific abnormal finding of lung field: Secondary | ICD-10-CM

## 2022-10-04 DIAGNOSIS — Z1152 Encounter for screening for COVID-19: Secondary | ICD-10-CM | POA: Diagnosis not present

## 2022-10-04 DIAGNOSIS — I10 Essential (primary) hypertension: Secondary | ICD-10-CM

## 2022-10-04 DIAGNOSIS — I480 Paroxysmal atrial fibrillation: Secondary | ICD-10-CM | POA: Insufficient documentation

## 2022-10-04 DIAGNOSIS — Z01812 Encounter for preprocedural laboratory examination: Secondary | ICD-10-CM | POA: Insufficient documentation

## 2022-10-04 DIAGNOSIS — Z01818 Encounter for other preprocedural examination: Secondary | ICD-10-CM

## 2022-10-04 HISTORY — DX: Other specified postprocedural states: Z98.890

## 2022-10-04 HISTORY — DX: Other complications of anesthesia, initial encounter: T88.59XA

## 2022-10-04 HISTORY — DX: Nausea with vomiting, unspecified: R11.2

## 2022-10-04 HISTORY — DX: Family history of other specified conditions: Z84.89

## 2022-10-04 LAB — BLOOD GAS, ARTERIAL
Acid-Base Excess: 1 mmol/L (ref 0.0–2.0)
Bicarbonate: 25.2 mmol/L (ref 20.0–28.0)
Drawn by: 6643
O2 Saturation: 100 %
Patient temperature: 37
pCO2 arterial: 38 mmHg (ref 32–48)
pH, Arterial: 7.43 (ref 7.35–7.45)
pO2, Arterial: 90 mmHg (ref 83–108)

## 2022-10-04 LAB — URINALYSIS, ROUTINE W REFLEX MICROSCOPIC
Bilirubin Urine: NEGATIVE
Glucose, UA: NEGATIVE mg/dL
Hgb urine dipstick: NEGATIVE
Ketones, ur: NEGATIVE mg/dL
Leukocytes,Ua: NEGATIVE
Nitrite: NEGATIVE
Protein, ur: NEGATIVE mg/dL
Specific Gravity, Urine: 1.024 (ref 1.005–1.030)
pH: 6 (ref 5.0–8.0)

## 2022-10-04 LAB — CBC
HCT: 32.3 % — ABNORMAL LOW (ref 36.0–46.0)
Hemoglobin: 9.8 g/dL — ABNORMAL LOW (ref 12.0–15.0)
MCH: 17.9 pg — ABNORMAL LOW (ref 26.0–34.0)
MCHC: 30.3 g/dL (ref 30.0–36.0)
MCV: 59.2 fL — ABNORMAL LOW (ref 80.0–100.0)
Platelets: 259 10*3/uL (ref 150–400)
RBC: 5.46 MIL/uL — ABNORMAL HIGH (ref 3.87–5.11)
RDW: 22 % — ABNORMAL HIGH (ref 11.5–15.5)
WBC: 11.5 10*3/uL — ABNORMAL HIGH (ref 4.0–10.5)
nRBC: 0 % (ref 0.0–0.2)

## 2022-10-04 LAB — COMPREHENSIVE METABOLIC PANEL
ALT: 11 U/L (ref 0–44)
AST: 13 U/L — ABNORMAL LOW (ref 15–41)
Albumin: 3.1 g/dL — ABNORMAL LOW (ref 3.5–5.0)
Alkaline Phosphatase: 65 U/L (ref 38–126)
Anion gap: 12 (ref 5–15)
BUN: 7 mg/dL (ref 6–20)
CO2: 21 mmol/L — ABNORMAL LOW (ref 22–32)
Calcium: 9 mg/dL (ref 8.9–10.3)
Chloride: 107 mmol/L (ref 98–111)
Creatinine, Ser: 0.9 mg/dL (ref 0.44–1.00)
GFR, Estimated: >60 mL/min (ref >60)
Glucose, Bld: 94 mg/dL (ref 70–99)
Potassium: 3.8 mmol/L (ref 3.5–5.1)
Sodium: 140 mmol/L (ref 135–145)
Total Bilirubin: <0.1 mg/dL — ABNORMAL LOW (ref 0.3–1.2)
Total Protein: 6.6 g/dL (ref 6.5–8.1)

## 2022-10-04 LAB — PROTIME-INR
INR: 1.1 (ref 0.8–1.2)
Prothrombin Time: 14.4 seconds (ref 11.4–15.2)

## 2022-10-04 LAB — SURGICAL PCR SCREEN
MRSA, PCR: NEGATIVE
Staphylococcus aureus: NEGATIVE

## 2022-10-04 LAB — SARS CORONAVIRUS 2 BY RT PCR: SARS Coronavirus 2 by RT PCR: NEGATIVE

## 2022-10-04 LAB — APTT: aPTT: 32 seconds (ref 24–36)

## 2022-10-04 NOTE — Progress Notes (Signed)
PCP - Dr. Beryle Flock with [facility] in [place] with [place] in [place]  PPM/ICD - Denies  Chest x-ray - 10/04/22 EKG - 09/30/22 Stress Test - Yes "It's been a few years" ECHO - 05/02/17 Cardiac Cath - Denies  Sleep Study - Denies  DM =- Denies  Blood Thinner Instructions:N/A Aspirin Instructions:NA  ERAS Protcol -No  COVID TEST- 10/04/22   Anesthesia review: Yes cardiac history A-fib and for T wave inversion and prolonged QT per patient  Patient denies shortness of breath, fever, cough and chest pain at PAT appointment. Patient in the last 3 months has experienced some SOB and chest pain.    All instructions explained to the patient, with a verbal understanding of the material. Patient agrees to go over the instructions while at home for a better understanding. Patient also instructed to wear mask while in public after being tested for COVID-19. The opportunity to ask questions was provided.

## 2022-10-04 NOTE — Progress Notes (Signed)
Synopsis: First seen by [place] [facility] and December 2023 when the patient was hospitalized for hemoptysis in the setting of a lingular lung mass.  This had previously been identified at [facility] in 2023, initially is a 1.5 cm nodule which grew 2 over 2 cm.  On November 7 at [facility] she underwent a robotic biopsy which showed no malignant changes.  She was managed with expectant therapy and repeat CT scans of the chest showed that the nodule was growing and by the time she presented to [facility] in December 2023 with a chief complaint of hemoptysis, shortness of breath and chest tightness the nodule was noted to be greater than 3 cm.  She underwent a robotic biopsy again during the December 2023 hospitalization and the biopsy results showed no evidence of malignancy but inflammatory changes were noted.  The lesion was noted to be bloody with biopsy.  Cardiothoracic surgery saw the patient during that hospitalization and recommended a repeat outpatient PET scan.  Subjective:   PATIENT ID: Julie King GENDER: female DOB: 04/15/1991, MRN: [id number]   HPI  Chief Complaint  Patient presents with   Follow-up    Pt is still having pain on her left side from the lung mass. States that she does not have much of an appetite. States that the coughing up blood has almost completely subsided.   Julie King is still having pain when she takes a deep breath.  She still has some shortness of breath.  She has not yet had surgery.  Past Medical History:  Diagnosis Date   Atrial fibrillation ([place]) 2020   Family history of breast cancer    Headache 07/25/2017   Hypertension    Irritable bowel syndrome    Morbidly obese ([place]) 07/25/2017   QT prolongation    T wave inversion in EKG     Review of Systems  Constitutional:  Positive for malaise/fatigue. Negative for chills, fever and weight loss.  HENT:  Negative for congestion, sinus pain and sore throat.   Respiratory:  Positive for cough,  hemoptysis and shortness of breath. Negative for sputum production.   Cardiovascular:  Negative for chest pain and leg swelling.      Objective:  Physical Exam   Vitals:   10/04/22 0830  BP: 124/80  Pulse: 92  Temp: 98.2 F (36.8 C)  TempSrc: Oral  SpO2: 99%  Weight: (!) 322 lb 6.4 oz (146.2 kg)  Height: '5\' 6"'$  (1.676 m)    Gen: well appearing HENT: OP clear, neck supple PULM: CTA B, normal effort  CV: RRR, no mgr GI: BS+, soft, nontender Derm: no cyanosis or rash Psyche: normal mood and affect    CBC    Component Value Date/Time   WBC 12.5 (H) 09/30/2022 1445   RBC 5.83 (H) 09/30/2022 1445   HGB 10.4 (L) 09/30/2022 1445   HGB CANCELED 01/12/2022 1506   HCT 34.4 (L) 09/30/2022 1445   HCT CANCELED 01/12/2022 1506   PLT 303 09/30/2022 1445   PLT CANCELED 01/12/2022 1506   MCV 59.0 (L) 09/30/2022 1445   MCH 17.8 (L) 09/30/2022 1445   MCHC 30.2 09/30/2022 1445   RDW 22.5 (H) 09/30/2022 1445   LYMPHSABS 4.2 (H) 08/29/2022 1314   LYMPHSABS CANCELED 01/12/2022 1506   MONOABS 0.4 08/29/2022 1314   EOSABS 0.3 08/29/2022 1314   EOSABS CANCELED 01/12/2022 1506   BASOSABS 0.1 08/29/2022 1314   BASOSABS CANCELED 01/12/2022 1506     Chest imaging: June 25, 2022 PET CT  scan hypermetabolic pulmonary nodule in lingula abutting major fissure, left hilum with increased uptake, SUV max 4.0, reactive inguinal lymph nodes, right ovarian cyst July 05, 2022 robotic bronchoscopy at [facility] August 28, 2022 CT angiogram chest: No pulmonary embolism, left lung mass measuring 3.4 cm, left upper lobe, spanning major fissure.  No pleural effusion.  Radiology notes no enlarged lymph nodes in mediastinum or hilum 08/30/2022 status post bronchoscopy February 2024 CT angiogram no PE, mass is bigger, now cavitary, abutting the intercostal space on the left  PFT: January 2024 ratio 81%, FEV1 3.11 L 90% predicted, total lung capacity 5.12 L 94% predicted, DLCO 24.54 100%  predicted  Labs: January 2024 BAL AFB stain negative, culture pending January 2024 BAL culture no growth to date January 2024 BAL fungal culture no growth today  Path: August 30, 2022 left upper lobe needle aspiration, forceps biopsy and brushing: Negative for malignancy, prominent acute inflammatory changes  Echo:  Heart Catheterization:       Assessment & Plan:   Lung mass  Hemoptysis  Discussion: Unfortunately Julie King's mass continues to grow.  We have worked extensively with her insurance company to get approval for a PET scan.  She is to have surgical resection shortly thereafter.  Plan: Lung mass, now cavitary, left lower lobe: Proceed with surgery with Dr. Roxan Hockey PET scan prior to surgery  Cough: Continue Tussionex as needed for cough  We will see you back in 6 weeks or sooner if needed  Immunizations: Immunization History  Administered Date(s) Administered   Influenza Split 06/03/2022   Tdap 12/26/2019     Current Outpatient Medications:    albuterol (VENTOLIN HFA) 108 (90 Base) MCG/ACT inhaler, Inhale 2 puffs into the lungs every 6 (six) hours as needed for wheezing or shortness of breath (Cough)., Disp: 18 g, Rfl: 5   benzonatate (TESSALON) 200 MG capsule, Take 1 capsule (200 mg total) by mouth 3 (three) times daily., Disp: 20 capsule, Rfl: 0   chlorpheniramine-HYDROcodone (TUSSIONEX) 10-8 MG/5ML, Take 5 mLs by mouth every 12 (twelve) hours as needed for cough., Disp: 115 mL, Rfl: 0   chlorpheniramine-HYDROcodone (TUSSIONEX) 10-8 MG/5ML, Take 5 mLs by mouth every 12 (twelve) hours as needed for cough., Disp: 473 mL, Rfl: 0   diclofenac Sodium (VOLTAREN) 1 % GEL, Apply 4 g topically 4 (four) times daily., Disp: 100 g, Rfl: 0   fluticasone (FLONASE) 50 MCG/ACT nasal spray, Place 1 spray into both nostrils daily., Disp: 47.4 mL, Rfl: 1   ipratropium-albuterol (DUONEB) 0.5-2.5 (3) MG/3ML SOLN, Take 3 mLs by nebulization 4 (four) times daily. (Patient  taking differently: Take 3 mLs by nebulization every 6 (six) hours as needed (shortness of breath).), Disp: 360 mL, Rfl: 1   mometasone-formoterol (DULERA) 200-5 MCG/ACT AERO, Inhale 2 puffs into the lungs 2 (two) times daily., Disp: 1 each, Rfl: 3   nicotine (NICODERM CQ - DOSED IN MG/24 HOURS) 14 mg/24hr patch, Place 1 patch (14 mg total) onto the skin daily. (Patient taking differently: Place 14 mg onto the skin daily as needed (smoking cessation).), Disp: 28 patch, Rfl: 1  > 50% of this 35 min visit spent face to face discussing her illness and answering questions

## 2022-10-04 NOTE — Patient Instructions (Signed)
Lung mass, now cavitary, left lower lobe: Proceed with surgery with Dr. Roxan Hockey PET scan prior to surgery  Cough: Continue Tussionex as needed for cough  We will see you back in 6 weeks or sooner if needed

## 2022-10-05 ENCOUNTER — Inpatient Hospital Stay (HOSPITAL_COMMUNITY)
Admission: RE | Admit: 2022-10-05 | Discharge: 2022-10-05 | Disposition: A | Discharge status: Home / Self Care | Payer: Medicaid Other | Source: Ambulatory Visit | Attending: Pulmonary Disease | Admitting: Pulmonary Disease

## 2022-10-05 DIAGNOSIS — R918 Other nonspecific abnormal finding of lung field: Secondary | ICD-10-CM | POA: Insufficient documentation

## 2022-10-05 LAB — GLUCOSE, CAPILLARY: Glucose-Capillary: 106 mg/dL — ABNORMAL HIGH (ref 70–99)

## 2022-10-05 MED ORDER — FLUDEOXYGLUCOSE F - 18 (FDG) INJECTION
14.9200 | Freq: Once | INTRAVENOUS | Status: AC | PRN
  Administered 2022-10-05: 14.92 via INTRAVENOUS

## 2022-10-05 NOTE — Progress Notes (Signed)
Anesthesia Chart Review:  History of paroxysmal atrial fibrillation first diagnosed in August 2018.  Echo at that time showed EF 55 to 60%, normal wall motion, normal valves.  Event monitor showed sinus rhythm, no atrial fibrillation, no sustained arrhythmias.  She was prescribed metoprolol and as needed Cardizem.  Patient did not have any follow-up until she reestablished with cardiology at [facility] in December 2020.  She was pregnant at that time and a repeat event monitor and echocardiogram were ordered.  Echo again showed EF 55 to 60%, normal wall motion, normal valves.  It is not clear if the event monitor was ever completed.  Patient was recently hospitalized for hemoptysis in the setting of lingular lung mass. This had previously been identified at [facility] in 2023, initially is a 1.5 cm nodule which grew 2 over 2 cm. On November 7 at [facility] she underwent a robotic biopsy which showed no malignant changes. She was managed with expectant therapy and repeat CT scans of the chest showed that the nodule was growing and by the time she presented to [facility] in December 2023 with a chief complaint of hemoptysis, shortness of breath and chest tightness the nodule was noted to be greater than 3 cm. She underwent a robotic biopsy again during the December 2023 hospitalization and the biopsy results showed no evidence of malignancy but inflammatory changes were noted.  Thoracic surgery has recommended PET scan with surgical resection shortly after.  Preop labs reviewed, moderate anemia with Hgb 9.8, otherwise unremarkable.   EKG 09/30/2022: Sinus tachycardia.  Rate 115.  Cannot rule out anterior infarct, age-indeterminate. T wave abnormalities in inferolateral leads more prominent than prior EKGs  CTA Chest 09/30/22: IMPRESSION: 1. Despite the repeat of this study, there is evaluation for pulmonary embolism secondary to contrast bolus timing. No large central pulmonary embolism is seen, however evaluation of  the lobar and more distal vessels is limited. 2. Interval biopsy of left lung mass compared to the prior 08/28/2022 CT. Reportedly this biopsy was benign. Interval development of a moderate central cavitary portion within this mass, possibly from the prior biopsy. Additionally, there is increased soft tissue extending from the lung mass there towards the anterior intercostal space between the fifth and sixth ribs, likely from the prior biopsy approach.  TTE 12/05/2019 (Care Everywhere): Left Ventricle  Left ventricle is normal in size. Wall thickness is normal. EF: 55-60%. Wall motion is normal. Doppler parameters indicate normal diastolic function.   Right Ventricle  Right ventricle appears normal. Systolic function is normal.   Left Atrium  Left atrium is normal in size.   Right Atrium  Normal sized right atrium.   IVC/SVC  The inferior vena cava demonstrates a diameter of <=2.1 cm and collapses >50%; therefore, the right atrial pressure is estimated at 3 mmHg.   Mitral Valve  Mitral valve structure is normal. There is trace regurgitation. There is no evidence of mitral valve stenosis.   Tricuspid Valve  The leaflets exhibit normal excursion. There is trace regurgitation. The right ventricular systolic pressure is normal (<36 mmHg).   Aortic Valve  The leaflets exhibit normal excursion. There is no regurgitation or stenosis.   Pulmonic Valve  Pulmonic valve with normal excursion. Trace regurgitation.   Ascending Aorta  The aortic root is normal in size.   Pericardium  There is no pericardial effusion.   Study Details  A complete echo was performed using complete 2D, color flow Doppler and spectral Doppler. Overall the study quality was adequate.  Coronary CT 01/15/2016: Impression:  1. Calcium score of 0 suggests very low likelihood of acute coronary syndrome or flow-limiting stenosis.  2. Left dominant coronary arterial system with no flow-limiting stenosis.  3.  Findings were communicated to Dr. Sharman Cheek, cardiologist on-call.      Julie King [facility]/Anesthesiology Phone [phone] 10/05/2022 9:54 AM

## 2022-10-05 NOTE — Anesthesia Preprocedure Evaluation (Signed)
Anesthesia Evaluation  Patient identified by MRN, date of birth, ID band Patient awake    Reviewed: Allergy & Precautions, NPO status , Patient's Chart, lab work & pertinent test results  History of Anesthesia Complications (+) PONV and history of anesthetic complications  Airway Mallampati: III  TM Distance: >3 FB Neck ROM: Full    Dental  (+) Dental Advisory Given, Poor Dentition, Missing   Pulmonary Current Smoker and Patient abstained from smoking. LLL NODULE   Pulmonary exam normal breath sounds clear to auscultation       Cardiovascular hypertension, Normal cardiovascular exam+ dysrhythmias Atrial Fibrillation  Rhythm:Regular Rate:Normal     Neuro/Psych  Headaches  negative psych ROS   GI/Hepatic negative GI ROS, Neg liver ROS,,,  Endo/Other    Morbid obesity  Renal/GU negative Renal ROS     Musculoskeletal negative musculoskeletal ROS (+)    Abdominal   Peds  Hematology  (+) Blood dyscrasia, anemia   Anesthesia Other Findings Day of surgery medications reviewed with the patient.  Reproductive/Obstetrics                             Anesthesia Physical Anesthesia Plan  ASA: 4  Anesthesia Plan: General   Post-op Pain Management: Tylenol PO (pre-op)*   Induction: Intravenous  PONV Risk Score and Plan: 3 and Midazolam, Dexamethasone and Ondansetron  Airway Management Planned: Double Lumen EBT and Oral ETT  Additional Equipment: Arterial line, CVP and Ultrasound Guidance Line Placement  Intra-op Plan:   Post-operative Plan: Extubation in OR  Informed Consent: I have reviewed the patients History and Physical, chart, labs and discussed the procedure including the risks, benefits and alternatives for the proposed anesthesia with the patient or authorized representative who has indicated his/her understanding and acceptance.     Dental advisory given  Plan Discussed with:  CRNA  Anesthesia Plan Comments: (PAT note by Karoline Caldwell, PA-C:  History of paroxysmal atrial fibrillation first diagnosed in August 2018.  Echo at that time showed EF 55 to 60%, normal wall motion, normal valves.  Event monitor showed sinus rhythm, no atrial fibrillation, no sustained arrhythmias.  She was prescribed metoprolol and as needed Cardizem.  Patient did not have any follow-up until she reestablished with cardiology at [facility] in December 2020.  She was pregnant at that time and a repeat event monitor and echocardiogram were ordered.  Echo again showed EF 55 to 60%, normal wall motion, normal valves.  It is not clear if the event monitor was ever completed.  Patient was recently hospitalized for hemoptysis in the setting of lingular lung mass. This had previously been identified at [facility] in 2023, initially is a 1.5 cm nodule which grew 2 over 2 cm. On November 7 at [facility] she underwent a robotic biopsy which showed no malignant changes. She was managed with expectant therapy and repeat CT scans of the chest showed that the nodule was growing and by the time she presented to [facility] in December 2023 with a chief complaint of hemoptysis, shortness of breath and chest tightness the nodule was noted to be greater than 3 cm. She underwent a robotic biopsy again during the December 2023 hospitalization and the biopsy results showed no evidence of malignancy but inflammatory changes were noted.  Thoracic surgery has recommended PET scan with surgical resection shortly after.  Preop labs reviewed, moderate anemia with Hgb 9.8, otherwise unremarkable.   EKG 09/30/2022: Sinus tachycardia.  Rate 115.  Cannot rule out anterior infarct, age-indeterminate. T wave abnormalities in inferolateral leads more prominent than prior EKGs  CTA Chest 09/30/22: IMPRESSION: 1. Despite the repeat of this study, there is evaluation for pulmonary embolism secondary to contrast bolus timing. No large central  pulmonary embolism is seen, however evaluation of the lobar and more distal vessels is limited. 2. Interval biopsy of left lung mass compared to the prior 08/28/2022 CT. Reportedly this biopsy was benign. Interval development of a moderate central cavitary portion within this mass, possibly from the prior biopsy. Additionally, there is increased soft tissue extending from the lung mass there towards the anterior intercostal space between the fifth and sixth ribs, likely from the prior biopsy approach.  TTE 12/05/2019 (Care Everywhere): Left Ventricle  Left ventricle is normal in size. Wall thickness is normal. EF: 55-60%. Wall motion is normal. Doppler parameters indicate normal diastolic function.   Right Ventricle  Right ventricle appears normal. Systolic function is normal.   Left Atrium  Left atrium is normal in size.   Right Atrium  Normal sized right atrium.   IVC/SVC  The inferior vena cava demonstrates a diameter of <=2.1 cm and collapses >50%; therefore, the right atrial pressure is estimated at 3 mmHg.   Mitral Valve  Mitral valve structure is normal. There is trace regurgitation. There is no evidence of mitral valve stenosis.   Tricuspid Valve  The leaflets exhibit normal excursion. There is trace regurgitation. The right ventricular systolic pressure is normal (<36 mmHg).   Aortic Valve  The leaflets exhibit normal excursion. There is no regurgitation or stenosis.   Pulmonic Valve  Pulmonic valve with normal excursion. Trace regurgitation.   Ascending Aorta  The aortic root is normal in size.   Pericardium  There is no pericardial effusion.   Study Details  A complete echo was performed using complete 2D, color flow Doppler and spectral Doppler. Overall the study quality was adequate.   Coronary CT 01/15/2016: Impression:  1. Calcium score of 0 suggests very low likelihood of acute coronary syndrome or flow-limiting stenosis.  2. Left dominant coronary  arterial system with no flow-limiting stenosis.  3. Findings were communicated to Dr. Sharman Cheek, cardiologist on-call.   )        Anesthesia Quick Evaluation

## 2022-10-06 ENCOUNTER — Encounter (HOSPITAL_COMMUNITY)
Admission: RE | Disposition: A | Discharge status: Home / Self Care | Payer: Self-pay | Source: Home / Self Care | Attending: Thoracic Surgery (Cardiothoracic Vascular Surgery)

## 2022-10-06 ENCOUNTER — Inpatient Hospital Stay (HOSPITAL_COMMUNITY): Payer: Medicaid Other

## 2022-10-06 ENCOUNTER — Inpatient Hospital Stay (HOSPITAL_COMMUNITY): Payer: Medicaid Other | Admitting: Physician Assistant

## 2022-10-06 ENCOUNTER — Encounter (HOSPITAL_COMMUNITY): Payer: Self-pay | Admitting: Thoracic Surgery (Cardiothoracic Vascular Surgery)

## 2022-10-06 ENCOUNTER — Inpatient Hospital Stay (HOSPITAL_COMMUNITY): Payer: Medicaid Other | Admitting: Certified Registered Nurse Anesthetist

## 2022-10-06 ENCOUNTER — Other Ambulatory Visit: Payer: Self-pay

## 2022-10-06 ENCOUNTER — Inpatient Hospital Stay (HOSPITAL_COMMUNITY)
Admission: RE | Admit: 2022-10-06 | Discharge: 2022-10-09 | DRG: 164 | Disposition: A | Discharge status: Home / Self Care | Payer: Medicaid Other | Attending: Thoracic Surgery (Cardiothoracic Vascular Surgery) | Admitting: Thoracic Surgery (Cardiothoracic Vascular Surgery)

## 2022-10-06 DIAGNOSIS — Z885 Allergy status to narcotic agent status: Secondary | ICD-10-CM | POA: Diagnosis not present

## 2022-10-06 DIAGNOSIS — F17210 Nicotine dependence, cigarettes, uncomplicated: Secondary | ICD-10-CM | POA: Diagnosis present

## 2022-10-06 DIAGNOSIS — Z9049 Acquired absence of other specified parts of digestive tract: Secondary | ICD-10-CM

## 2022-10-06 DIAGNOSIS — R918 Other nonspecific abnormal finding of lung field: Secondary | ICD-10-CM | POA: Diagnosis present

## 2022-10-06 DIAGNOSIS — I4891 Unspecified atrial fibrillation: Secondary | ICD-10-CM | POA: Diagnosis not present

## 2022-10-06 DIAGNOSIS — Z7951 Long term (current) use of inhaled steroids: Secondary | ICD-10-CM | POA: Diagnosis not present

## 2022-10-06 DIAGNOSIS — J851 Abscess of lung with pneumonia: Principal | ICD-10-CM | POA: Diagnosis present

## 2022-10-06 DIAGNOSIS — R110 Nausea: Secondary | ICD-10-CM | POA: Diagnosis not present

## 2022-10-06 DIAGNOSIS — Z6841 Body Mass Index (BMI) 40.0 and over, adult: Secondary | ICD-10-CM | POA: Diagnosis not present

## 2022-10-06 DIAGNOSIS — Z803 Family history of malignant neoplasm of breast: Secondary | ICD-10-CM | POA: Diagnosis not present

## 2022-10-06 DIAGNOSIS — I10 Essential (primary) hypertension: Secondary | ICD-10-CM | POA: Diagnosis present

## 2022-10-06 DIAGNOSIS — Z79899 Other long term (current) drug therapy: Secondary | ICD-10-CM | POA: Diagnosis not present

## 2022-10-06 DIAGNOSIS — Z8249 Family history of ischemic heart disease and other diseases of the circulatory system: Secondary | ICD-10-CM

## 2022-10-06 DIAGNOSIS — R911 Solitary pulmonary nodule: Secondary | ICD-10-CM | POA: Diagnosis not present

## 2022-10-06 DIAGNOSIS — I480 Paroxysmal atrial fibrillation: Secondary | ICD-10-CM | POA: Diagnosis present

## 2022-10-06 DIAGNOSIS — Z91018 Allergy to other foods: Secondary | ICD-10-CM | POA: Diagnosis not present

## 2022-10-06 DIAGNOSIS — Z833 Family history of diabetes mellitus: Secondary | ICD-10-CM

## 2022-10-06 DIAGNOSIS — R042 Hemoptysis: Secondary | ICD-10-CM | POA: Diagnosis present

## 2022-10-06 DIAGNOSIS — Z8349 Family history of other endocrine, nutritional and metabolic diseases: Secondary | ICD-10-CM | POA: Diagnosis not present

## 2022-10-06 DIAGNOSIS — D62 Acute posthemorrhagic anemia: Secondary | ICD-10-CM | POA: Diagnosis not present

## 2022-10-06 HISTORY — PX: VIDEO BRONCHOSCOPY WITH ENDOBRONCHIAL ULTRASOUND: SHX6177

## 2022-10-06 HISTORY — PX: INTERCOSTAL NERVE BLOCK: SHX5021

## 2022-10-06 LAB — HCG, QUANTITATIVE, PREGNANCY: hCG, Beta Chain, Quant, S: <1 m[IU]/mL (ref <5)

## 2022-10-06 SURGERY — WEDGE RESECTION, LUNG, ROBOT-ASSISTED, THORACOSCOPIC
Anesthesia: General | Site: Chest

## 2022-10-06 MED ORDER — ACETAMINOPHEN 500 MG PO TABS
1000.0000 mg | ORAL_TABLET | Freq: Four times a day (QID) | ORAL | Status: DC
  Administered 2022-10-06 – 2022-10-09 (×11): 1000 mg via ORAL
  Filled 2022-10-06 (×12): qty 2

## 2022-10-06 MED ORDER — LABETALOL HCL 5 MG/ML IV SOLN
INTRAVENOUS | Status: DC | PRN
  Administered 2022-10-06: 5 mg via INTRAVENOUS
  Administered 2022-10-06 (×2): 2.5 mg via INTRAVENOUS

## 2022-10-06 MED ORDER — EPINEPHRINE PF 1 MG/ML IJ SOLN
INTRAMUSCULAR | Status: DC | PRN
  Administered 2022-10-06: 1 mg via ENDOTRACHEOPULMONARY

## 2022-10-06 MED ORDER — PROPOFOL 10 MG/ML IV BOLUS
INTRAVENOUS | Status: DC | PRN
  Administered 2022-10-06: 30 mg via INTRAVENOUS
  Administered 2022-10-06: 20 mg via INTRAVENOUS
  Administered 2022-10-06: 200 mg via INTRAVENOUS
  Administered 2022-10-06: 10 mg via INTRAVENOUS
  Administered 2022-10-06: 50 mg via INTRAVENOUS

## 2022-10-06 MED ORDER — PROPOFOL 10 MG/ML IV BOLUS
INTRAVENOUS | Status: AC
  Filled 2022-10-06: qty 20

## 2022-10-06 MED ORDER — FENTANYL CITRATE (PF) 100 MCG/2ML IJ SOLN
INTRAMUSCULAR | Status: AC
  Filled 2022-10-06: qty 4

## 2022-10-06 MED ORDER — LABETALOL HCL 5 MG/ML IV SOLN
INTRAVENOUS | Status: AC
  Filled 2022-10-06: qty 4

## 2022-10-06 MED ORDER — LACTATED RINGERS IV SOLN: INTRAVENOUS | Status: DC

## 2022-10-06 MED ORDER — TRAMADOL HCL 50 MG PO TABS
50.0000 mg | ORAL_TABLET | Freq: Four times a day (QID) | ORAL | Status: DC | PRN
  Administered 2022-10-06 – 2022-10-09 (×4): 100 mg via ORAL
  Filled 2022-10-06 (×3): qty 2

## 2022-10-06 MED ORDER — LIDOCAINE 2% (20 MG/ML) 5 ML SYRINGE
INTRAMUSCULAR | Status: DC | PRN
  Administered 2022-10-06: 100 mg via INTRAVENOUS

## 2022-10-06 MED ORDER — FENTANYL CITRATE (PF) 100 MCG/2ML IJ SOLN
INTRAMUSCULAR | Status: AC
  Filled 2022-10-06: qty 2

## 2022-10-06 MED ORDER — MIDAZOLAM HCL 2 MG/2ML IJ SOLN: INTRAMUSCULAR | Status: DC | PRN

## 2022-10-06 MED ORDER — SODIUM CHLORIDE 0.9 % IV SOLN
INTRAVENOUS | Status: AC | PRN
  Administered 2022-10-06: 1000 mL

## 2022-10-06 MED ORDER — CEFAZOLIN SODIUM-DEXTROSE 2-4 GM/100ML-% IV SOLN
2.0000 g | INTRAVENOUS | Status: AC
  Administered 2022-10-06: 3 g via INTRAVENOUS

## 2022-10-06 MED ORDER — 0.9 % SODIUM CHLORIDE (POUR BTL) OPTIME
TOPICAL | Status: DC | PRN
  Administered 2022-10-06: 2000 mL

## 2022-10-06 MED ORDER — LABETALOL HCL 5 MG/ML IV SOLN
10.0000 mg | INTRAVENOUS | Status: DC | PRN
  Administered 2022-10-06: 10 mg via INTRAVENOUS

## 2022-10-06 MED ORDER — SUGAMMADEX SODIUM 200 MG/2ML IV SOLN
INTRAVENOUS | Status: DC | PRN
  Administered 2022-10-06: 100 mg via INTRAVENOUS
  Administered 2022-10-06: 400 mg via INTRAVENOUS

## 2022-10-06 MED ORDER — PROMETHAZINE HCL 25 MG/ML IJ SOLN: 6.2500 mg | INTRAMUSCULAR | Status: DC | PRN

## 2022-10-06 MED ORDER — METHYLENE BLUE 1 % INJ SOLN
INTRAVENOUS | Status: AC
  Filled 2022-10-06: qty 10

## 2022-10-06 MED ORDER — ROCURONIUM BROMIDE 10 MG/ML (PF) SYRINGE
PREFILLED_SYRINGE | INTRAVENOUS | Status: DC | PRN
  Administered 2022-10-06: 80 mg via INTRAVENOUS
  Administered 2022-10-06: 20 mg via INTRAVENOUS
  Administered 2022-10-06: 50 mg via INTRAVENOUS

## 2022-10-06 MED ORDER — BISACODYL 5 MG PO TBEC
10.0000 mg | DELAYED_RELEASE_TABLET | Freq: Every day | ORAL | Status: DC
  Administered 2022-10-07 – 2022-10-09 (×3): 10 mg via ORAL
  Filled 2022-10-06 (×4): qty 2

## 2022-10-06 MED ORDER — DEXTROSE-NACL 5-0.9 % IV SOLN: INTRAVENOUS | Status: DC

## 2022-10-06 MED ORDER — KETOROLAC TROMETHAMINE 0.5 % OP SOLN: 1.0000 [drp] | Freq: Four times a day (QID) | OPHTHALMIC | Status: DC

## 2022-10-06 MED ORDER — BUPIVACAINE HCL (PF) 0.5 % IJ SOLN
INTRAMUSCULAR | Status: AC
  Filled 2022-10-06: qty 30

## 2022-10-06 MED ORDER — FENTANYL CITRATE (PF) 100 MCG/2ML IJ SOLN
25.0000 ug | INTRAMUSCULAR | Status: DC | PRN
  Administered 2022-10-06 (×4): 50 ug via INTRAVENOUS

## 2022-10-06 MED ORDER — ESMOLOL HCL 100 MG/10ML IV SOLN
INTRAVENOUS | Status: DC | PRN
  Administered 2022-10-06: 40 mg via INTRAVENOUS

## 2022-10-06 MED ORDER — PANTOPRAZOLE SODIUM 40 MG PO TBEC
40.0000 mg | DELAYED_RELEASE_TABLET | Freq: Every day | ORAL | Status: DC
  Administered 2022-10-07 – 2022-10-09 (×3): 40 mg via ORAL
  Filled 2022-10-06 (×4): qty 1

## 2022-10-06 MED ORDER — CHLORHEXIDINE GLUCONATE 0.12 % MT SOLN: 15.0000 mL | Freq: Once | OROMUCOSAL | Status: AC

## 2022-10-06 MED ORDER — SODIUM CHLORIDE FLUSH 0.9 % IV SOLN
INTRAVENOUS | Status: DC | PRN
  Administered 2022-10-06: 100 mL

## 2022-10-06 MED ORDER — KETOROLAC TROMETHAMINE 0.5 % OP SOLN
OPHTHALMIC | Status: AC
  Filled 2022-10-06: qty 5

## 2022-10-06 MED ORDER — FENTANYL CITRATE (PF) 250 MCG/5ML IJ SOLN
INTRAMUSCULAR | Status: DC | PRN
  Administered 2022-10-06 (×2): 50 ug via INTRAVENOUS
  Administered 2022-10-06: 150 ug via INTRAVENOUS
  Administered 2022-10-06: 50 ug via INTRAVENOUS

## 2022-10-06 MED ORDER — TRAMADOL HCL 50 MG PO TABS
ORAL_TABLET | ORAL | Status: AC
  Filled 2022-10-06: qty 2

## 2022-10-06 MED ORDER — ACETAMINOPHEN 500 MG PO TABS: 1000.0000 mg | ORAL_TABLET | Freq: Once | ORAL | Status: AC

## 2022-10-06 MED ORDER — PROPOFOL 500 MG/50ML IV EMUL
INTRAVENOUS | Status: DC | PRN
  Administered 2022-10-06: 25 ug/kg/min via INTRAVENOUS

## 2022-10-06 MED ORDER — MOMETASONE FURO-FORMOTEROL FUM 200-5 MCG/ACT IN AERO
2.0000 | INHALATION_SPRAY | Freq: Two times a day (BID) | RESPIRATORY_TRACT | Status: DC
  Administered 2022-10-07 – 2022-10-08 (×3): 2 via RESPIRATORY_TRACT
  Filled 2022-10-06: qty 8.8

## 2022-10-06 MED ORDER — BUPIVACAINE LIPOSOME 1.3 % IJ SUSP
INTRAMUSCULAR | Status: AC
  Filled 2022-10-06: qty 20

## 2022-10-06 MED ORDER — DEXAMETHASONE SODIUM PHOSPHATE 10 MG/ML IJ SOLN
INTRAMUSCULAR | Status: DC | PRN
  Administered 2022-10-06: 10 mg via INTRAVENOUS

## 2022-10-06 MED ORDER — DICLOFENAC SODIUM 1 % EX GEL
4.0000 g | Freq: Four times a day (QID) | CUTANEOUS | Status: DC
  Administered 2022-10-06 – 2022-10-08 (×2): 4 g via TOPICAL
  Filled 2022-10-06: qty 100

## 2022-10-06 MED ORDER — CHLORHEXIDINE GLUCONATE 0.12 % MT SOLN
OROMUCOSAL | Status: AC
  Administered 2022-10-06: 15 mL via OROMUCOSAL
  Filled 2022-10-06: qty 15

## 2022-10-06 MED ORDER — IPRATROPIUM-ALBUTEROL 0.5-2.5 (3) MG/3ML IN SOLN
3.0000 mL | Freq: Four times a day (QID) | RESPIRATORY_TRACT | Status: DC | PRN
  Filled 2022-10-06: qty 3

## 2022-10-06 MED ORDER — ACETAMINOPHEN 160 MG/5ML PO SOLN
1000.0000 mg | Freq: Four times a day (QID) | ORAL | Status: DC
  Filled 2022-10-06: qty 35

## 2022-10-06 MED ORDER — SENNOSIDES-DOCUSATE SODIUM 8.6-50 MG PO TABS
1.0000 | ORAL_TABLET | Freq: Every day | ORAL | Status: DC
  Administered 2022-10-07 – 2022-10-08 (×2): 1 via ORAL
  Filled 2022-10-06 (×3): qty 1

## 2022-10-06 MED ORDER — LACTATED RINGERS IV SOLN: INTRAVENOUS | Status: DC | PRN

## 2022-10-06 MED ORDER — ORAL CARE MOUTH RINSE: 15.0000 mL | Freq: Once | OROMUCOSAL | Status: AC

## 2022-10-06 MED ORDER — GABAPENTIN 300 MG PO CAPS
300.0000 mg | ORAL_CAPSULE | Freq: Two times a day (BID) | ORAL | Status: DC
  Filled 2022-10-06: qty 1

## 2022-10-06 MED ORDER — DEXMEDETOMIDINE HCL IN NACL 80 MCG/20ML IV SOLN
INTRAVENOUS | Status: DC | PRN
  Administered 2022-10-06 (×3): 8 ug via BUCCAL

## 2022-10-06 MED ORDER — ONDANSETRON HCL 4 MG/2ML IJ SOLN
4.0000 mg | Freq: Four times a day (QID) | INTRAMUSCULAR | Status: DC | PRN
  Administered 2022-10-09: 4 mg via INTRAVENOUS
  Filled 2022-10-06 (×2): qty 2

## 2022-10-06 MED ORDER — MIDAZOLAM HCL 2 MG/2ML IJ SOLN
INTRAMUSCULAR | Status: AC
  Filled 2022-10-06: qty 2

## 2022-10-06 MED ORDER — EPINEPHRINE PF 1 MG/ML IJ SOLN
INTRAMUSCULAR | Status: AC
  Filled 2022-10-06: qty 1

## 2022-10-06 MED ORDER — HYDROCOD POLI-CHLORPHE POLI ER 10-8 MG/5ML PO SUER
5.0000 mL | Freq: Two times a day (BID) | ORAL | Status: DC | PRN
  Filled 2022-10-06: qty 5

## 2022-10-06 MED ORDER — PHENYLEPHRINE HCL-NACL 20-0.9 MG/250ML-% IV SOLN
INTRAVENOUS | Status: DC | PRN
  Administered 2022-10-06: 20 ug/min via INTRAVENOUS

## 2022-10-06 MED ORDER — GABAPENTIN 300 MG PO CAPS
300.0000 mg | ORAL_CAPSULE | Freq: Every day | ORAL | Status: DC
  Administered 2022-10-06: 300 mg via ORAL
  Filled 2022-10-06 (×2): qty 1

## 2022-10-06 MED ORDER — FENTANYL CITRATE (PF) 250 MCG/5ML IJ SOLN
INTRAMUSCULAR | Status: AC
  Filled 2022-10-06: qty 5

## 2022-10-06 MED ORDER — CEFAZOLIN SODIUM-DEXTROSE 2-4 GM/100ML-% IV SOLN
2.0000 g | Freq: Three times a day (TID) | INTRAVENOUS | Status: AC
  Administered 2022-10-06 (×2): 2 g via INTRAVENOUS
  Filled 2022-10-06 (×2): qty 100

## 2022-10-06 MED ORDER — CEFAZOLIN IN SODIUM CHLORIDE 3-0.9 GM/100ML-% IV SOLN
INTRAVENOUS | Status: AC
  Filled 2022-10-06: qty 100

## 2022-10-06 MED ORDER — ACETAMINOPHEN 500 MG PO TABS
ORAL_TABLET | ORAL | Status: AC
  Filled 2022-10-06: qty 2

## 2022-10-06 MED ORDER — PROPOFOL 500 MG/50ML IV EMUL: INTRAVENOUS | Status: DC | PRN

## 2022-10-06 MED ORDER — FENTANYL CITRATE PF 50 MCG/ML IJ SOSY
25.0000 ug | PREFILLED_SYRINGE | INTRAMUSCULAR | Status: DC | PRN
  Administered 2022-10-06 – 2022-10-07 (×5): 50 ug via INTRAVENOUS
  Filled 2022-10-06 (×6): qty 1

## 2022-10-06 MED ORDER — ENOXAPARIN SODIUM 40 MG/0.4ML IJ SOSY
40.0000 mg | PREFILLED_SYRINGE | Freq: Every day | INTRAMUSCULAR | Status: DC
  Administered 2022-10-06 – 2022-10-09 (×4): 40 mg via SUBCUTANEOUS
  Filled 2022-10-06 (×5): qty 0.4

## 2022-10-06 MED ORDER — INDOCYANINE GREEN 25 MG IV SOLR
INTRAVENOUS | Status: AC
  Filled 2022-10-06: qty 10

## 2022-10-06 MED ORDER — BENZONATATE 100 MG PO CAPS
200.0000 mg | ORAL_CAPSULE | Freq: Three times a day (TID) | ORAL | Status: DC
  Administered 2022-10-06 – 2022-10-09 (×8): 200 mg via ORAL
  Filled 2022-10-06 (×10): qty 2

## 2022-10-06 MED ORDER — ONDANSETRON HCL 4 MG/2ML IJ SOLN
INTRAMUSCULAR | Status: DC | PRN
  Administered 2022-10-06: 4 mg via INTRAVENOUS

## 2022-10-06 MED ORDER — MIDAZOLAM HCL 2 MG/2ML IJ SOLN
INTRAMUSCULAR | Status: DC | PRN
  Administered 2022-10-06: 2 mg via INTRAVENOUS

## 2022-10-06 MED ORDER — FLUTICASONE PROPIONATE 50 MCG/ACT NA SUSP
1.0000 | Freq: Every day | NASAL | Status: DC
  Administered 2022-10-07: 1 via NASAL
  Filled 2022-10-06: qty 16

## 2022-10-06 MED ORDER — CHLORHEXIDINE GLUCONATE CLOTH 2 % EX PADS
6.0000 | MEDICATED_PAD | Freq: Every day | CUTANEOUS | Status: DC
  Administered 2022-10-06 – 2022-10-09 (×4): 6 via TOPICAL

## 2022-10-06 MED ORDER — BSS IO SOLN
INTRAOCULAR | Status: AC
  Filled 2022-10-06: qty 15

## 2022-10-06 MED ORDER — KETOROLAC TROMETHAMINE 15 MG/ML IJ SOLN
15.0000 mg | Freq: Four times a day (QID) | INTRAMUSCULAR | Status: DC
  Administered 2022-10-06 – 2022-10-07 (×3): 15 mg via INTRAVENOUS
  Filled 2022-10-06 (×3): qty 1

## 2022-10-06 MED ORDER — ACETAMINOPHEN 500 MG PO TABS
ORAL_TABLET | ORAL | Status: AC
  Administered 2022-10-06: 1000 mg via ORAL
  Filled 2022-10-06: qty 2

## 2022-10-06 MED ORDER — KETOROLAC TROMETHAMINE 30 MG/ML IJ SOLN
30.0000 mg | Freq: Once | INTRAMUSCULAR | Status: AC | PRN
  Administered 2022-10-06: 30 mg via INTRAVENOUS

## 2022-10-06 MED ORDER — BSS IO SOLN
15.0000 mL | Freq: Once | INTRAOCULAR | Status: AC
  Administered 2022-10-06: 15 mL

## 2022-10-06 MED ORDER — KETOROLAC TROMETHAMINE 0.5 % OP SOLN
1.0000 [drp] | Freq: Four times a day (QID) | OPHTHALMIC | Status: DC
  Administered 2022-10-06: 1 [drp] via OPHTHALMIC

## 2022-10-06 MED ORDER — KETOROLAC TROMETHAMINE 30 MG/ML IJ SOLN
INTRAMUSCULAR | Status: AC
  Filled 2022-10-06: qty 1

## 2022-10-06 SURGICAL SUPPLY — 94 items
ADAPTER VALVE BIOPSY EBUS (MISCELLANEOUS) IMPLANT
ADPTR VALVE BIOPSY EBUS (MISCELLANEOUS) ×2
CANISTER SUCT 3000ML PPV (MISCELLANEOUS) ×2 IMPLANT
CANNULA REDUC XI 12-8 STAPL (CANNULA) ×4
CANNULA REDUCER 12-8 DVNC XI (CANNULA) ×4 IMPLANT
CNTNR URN SCR LID CUP LEK RST (MISCELLANEOUS) ×12 IMPLANT
CONT SPEC 4OZ STRL OR WHT (MISCELLANEOUS) ×24
COVER BACK TABLE 60X90IN (DRAPES) ×2 IMPLANT
DEFOGGER SCOPE WARMER CLEARIFY (MISCELLANEOUS) ×2 IMPLANT
DERMABOND ADVANCED .7 DNX12 (GAUZE/BANDAGES/DRESSINGS) IMPLANT
DRAPE ARM DVNC X/XI (DISPOSABLE) ×8 IMPLANT
DRAPE COLUMN DVNC XI (DISPOSABLE) ×2 IMPLANT
DRAPE CV SPLIT W-CLR ANES SCRN (DRAPES) ×2 IMPLANT
DRAPE DA VINCI XI ARM (DISPOSABLE) ×8
DRAPE DA VINCI XI COLUMN (DISPOSABLE) ×2
DRAPE HALF SHEET 40X57 (DRAPES) ×2 IMPLANT
DRAPE INCISE IOBAN 66X45 STRL (DRAPES) IMPLANT
DRAPE ORTHO SPLIT 77X108 STRL (DRAPES) ×2
DRAPE SURG ORHT 6 SPLT 77X108 (DRAPES) ×2 IMPLANT
ELECT REM PT RETURN 9FT ADLT (ELECTROSURGICAL) ×2
ELECTRODE REM PT RTRN 9FT ADLT (ELECTROSURGICAL) ×2 IMPLANT
FILTER STRAW FLUID ASPIR (MISCELLANEOUS) IMPLANT
FORCEPS RADIAL JAW LRG 4 PULM (INSTRUMENTS) IMPLANT
GAUZE KITTNER 4X5 RF (MISCELLANEOUS) ×4 IMPLANT
GAUZE SPONGE 4X4 12PLY STRL (GAUZE/BANDAGES/DRESSINGS) ×2 IMPLANT
GLOVE SS BIOGEL STRL SZ 7.5 (GLOVE) ×6 IMPLANT
GOWN STRL REUS W/ TWL LRG LVL3 (GOWN DISPOSABLE) ×4 IMPLANT
GOWN STRL REUS W/ TWL XL LVL3 (GOWN DISPOSABLE) ×2 IMPLANT
GOWN STRL REUS W/TWL 2XL LVL3 (GOWN DISPOSABLE) ×2 IMPLANT
GOWN STRL REUS W/TWL LRG LVL3 (GOWN DISPOSABLE) ×4
GOWN STRL REUS W/TWL XL LVL3 (GOWN DISPOSABLE) ×4
HEMOSTAT SURGICEL 2X14 (HEMOSTASIS) ×6 IMPLANT
IRRIGATION STRYKERFLOW (MISCELLANEOUS) ×2 IMPLANT
IRRIGATOR STRYKERFLOW (MISCELLANEOUS) ×2
KIT BASIN OR (CUSTOM PROCEDURE TRAY) ×2 IMPLANT
KIT CLEAN ENDO COMPLIANCE (KITS) ×4 IMPLANT
KIT SUCTION CATH 14FR (SUCTIONS) IMPLANT
KIT TURNOVER KIT B (KITS) ×4 IMPLANT
MARKER SKIN DUAL TIP RULER LAB (MISCELLANEOUS) ×2 IMPLANT
NDL ASPIRATION VIZISHOT 21G (NEEDLE) ×2 IMPLANT
NDL HYPO 25GX1X1/2 BEV (NEEDLE) ×2 IMPLANT
NDL SPNL 22GX3.5 QUINCKE BK (NEEDLE) ×2 IMPLANT
NEEDLE ASPIRATION VIZISHOT 21G (NEEDLE) ×2 IMPLANT
NEEDLE HYPO 25GX1X1/2 BEV (NEEDLE) ×2 IMPLANT
NEEDLE SPNL 22GX3.5 QUINCKE BK (NEEDLE) ×2 IMPLANT
NS IRRIG 1000ML POUR BTL (IV SOLUTION) ×2 IMPLANT
OIL SILICONE PENTAX (PARTS (SERVICE/REPAIRS)) ×2 IMPLANT
PACK CHEST (CUSTOM PROCEDURE TRAY) ×2 IMPLANT
PAD ARMBOARD 7.5X6 YLW CONV (MISCELLANEOUS) ×8 IMPLANT
PORT ACCESS TROCAR AIRSEAL 12 (TROCAR) ×2 IMPLANT
RELOAD STAPLE 45 3.5 BLU DVNC (STAPLE) IMPLANT
RELOAD STAPLE 45 4.3 GRN DVNC (STAPLE) IMPLANT
RELOAD STAPLER 3.5X45 BLU DVNC (STAPLE) ×6 IMPLANT
RELOAD STAPLER 4.3X45 GRN DVNC (STAPLE) ×8 IMPLANT
SCISSORS LAP 5X35 DISP (ENDOMECHANICALS) IMPLANT
SEAL CANN UNIV 5-8 DVNC XI (MISCELLANEOUS) ×4 IMPLANT
SEAL XI 5MM-8MM UNIVERSAL (MISCELLANEOUS) ×4
SET TRI-LUMEN FLTR TB AIRSEAL (TUBING) ×2 IMPLANT
SOLUTION ELECTROLUBE (MISCELLANEOUS) ×2 IMPLANT
SPONGE TONSIL TAPE 1 RFD (DISPOSABLE) IMPLANT
STAPLER 45 SUREFORM CVD (STAPLE) ×2
STAPLER 45 SUREFORM CVD DVNC (STAPLE) IMPLANT
STAPLER CANNULA SEAL DVNC XI (STAPLE) ×4 IMPLANT
STAPLER CANNULA SEAL XI (STAPLE) ×4
STAPLER RELOAD 3.5X45 BLU DVNC (STAPLE) ×6
STAPLER RELOAD 3.5X45 BLUE (STAPLE) ×6
STAPLER RELOAD 4.3X45 GREEN (STAPLE) ×8
STAPLER RELOAD 4.3X45 GRN DVNC (STAPLE) ×8
SUT SILK  1 MH (SUTURE) ×2
SUT SILK 1 MH (SUTURE) ×4 IMPLANT
SUT SILK 2 0 SH (SUTURE) IMPLANT
SUT VIC AB 1 CTX 36 (SUTURE) ×2
SUT VIC AB 1 CTX36XBRD ANBCTR (SUTURE) IMPLANT
SUT VIC AB 2-0 CTX 36 (SUTURE) IMPLANT
SUT VIC AB 3-0 X1 27 (SUTURE) ×4 IMPLANT
SUT VICRYL 0 TIES 12 18 (SUTURE) ×2 IMPLANT
SUT VICRYL 0 UR6 27IN ABS (SUTURE) ×4 IMPLANT
SYR 20ML ECCENTRIC (SYRINGE) ×4 IMPLANT
SYR 20ML LL LF (SYRINGE) ×6 IMPLANT
SYR 3ML LL SCALE MARK (SYRINGE) IMPLANT
SYR 5ML LL (SYRINGE) ×2 IMPLANT
SYR 5ML LUER SLIP (SYRINGE) ×2 IMPLANT
SYSTEM RETRIEVAL ANCHOR 8 (MISCELLANEOUS) IMPLANT
SYSTEM SAHARA CHEST DRAIN ATS (WOUND CARE) ×2 IMPLANT
TAPE CLOTH 4X10 WHT NS (GAUZE/BANDAGES/DRESSINGS) ×2 IMPLANT
TOWEL GREEN STERILE (TOWEL DISPOSABLE) ×6 IMPLANT
TOWEL GREEN STERILE FF (TOWEL DISPOSABLE) ×2 IMPLANT
TRAP SPECIMEN MUCUS 40CC (MISCELLANEOUS) ×2 IMPLANT
TRAY FOLEY MTR SLVR 16FR STAT (SET/KITS/TRAYS/PACK) ×2 IMPLANT
TUBE CONNECTING 20X1/4 (TUBING) ×2 IMPLANT
VALVE BIOPSY  SINGLE USE (MISCELLANEOUS) ×2
VALVE BIOPSY SINGLE USE (MISCELLANEOUS) ×2 IMPLANT
VALVE SUCTION BRONCHIO DISP (MISCELLANEOUS) ×2 IMPLANT
WATER STERILE IRR 1000ML POUR (IV SOLUTION) ×4 IMPLANT

## 2022-10-06 NOTE — Anesthesia Procedure Notes (Signed)
Arterial Line Insertion Start/End2/03/2023 7:00 AM, 10/06/2022 7:15 AM Performed by: Dorthea Cove, RN, CRNA  Patient location: Pre-op. Preanesthetic checklist: patient identified, IV checked, site marked, risks and benefits discussed, surgical consent, monitors and equipment checked, pre-op evaluation, timeout performed and anesthesia consent Lidocaine 1% used for infiltration Right, radial was placed Catheter size: 20 G Hand hygiene performed  and maximum sterile barriers used   Attempts: 1 Procedure performed without using ultrasound guided technique. Following insertion, dressing applied and Biopatch. Post procedure assessment: normal and unchanged  Patient tolerated the procedure well with no immediate complications.

## 2022-10-06 NOTE — Anesthesia Procedure Notes (Signed)
Central Venous Catheter Insertion Performed by: Santa Lighter, MD, anesthesiologist Start/End2/03/2023 7:15 AM, 10/06/2022 7:25 AM Preanesthetic checklist: patient identified, IV checked, site marked, risks and benefits discussed, surgical consent, monitors and equipment checked, pre-op evaluation, timeout performed and anesthesia consent Position: Trendelenburg Lidocaine 1% used for infiltration and patient sedated Hand hygiene performed , maximum sterile barriers used  and Seldinger technique used Catheter size: 8 Fr Total catheter length 16. Central line was placed.Double lumen Procedure performed using ultrasound guided technique. Ultrasound Notes:anatomy identified, needle tip was noted to be adjacent to the nerve/plexus identified, no ultrasound evidence of intravascular and/or intraneural injection and image(s) printed for medical record Attempts: 1 Following insertion, line sutured, dressing applied and Biopatch. Post procedure assessment: blood return through all ports, free fluid flow and no air  Patient tolerated the procedure well with no immediate complications.

## 2022-10-06 NOTE — TOC Initial Note (Signed)
Transition of Care [facility]) - Initial/Assessment Note    Patient Details  Name: Julie King MRN: [id number] Date of Birth: Mar 26, 1991  [facility] CM/SW Contact:    Verdell Carmine, RN Phone Number: 10/06/2022, 3:03 PM  Clinical Narrative:                 TOC has reviewed the patient and found no needs. The patient will be discussed in daily progression rounds. If a need is identified, please place a TOC consult        Patient Goals and CMS Choice            Expected Discharge Plan and Services                                              Prior Living Arrangements/Services                       Activities of Daily Living      Permission Sought/Granted                  Emotional Assessment              Admission diagnosis:  Mass of left lung [R91.8] Patient Active Problem List   Diagnosis Date Noted   Mass of left lung 10/06/2022   Hemoptysis 08/28/2022   Lung mass 08/28/2022   Family history of breast cancer    Essential hypertension 03/16/2020   History of atrial fibrillation 11/28/2019   Headache 07/25/2017   Morbidly obese ([place]) 07/25/2017   PCP:  Hattie Perch, MD Pharmacy:   [facility] [place], [place] [address] [facility] [address] [place] [address] Phone: [phone] Fax: [phone]     Social Determinants of Health (SDOH) Social History: SDOH Screenings   Food Insecurity: No Food Insecurity (08/29/2022)  Housing: Low Risk  (08/29/2022)  Transportation Needs: No Transportation Needs (08/29/2022)  Utilities: Not At Risk (08/29/2022)  Physical Activity: Inactive (11/09/2017)  Social Connections: Somewhat Isolated (11/09/2017)  Stress: No Stress Concern Present (11/09/2017)  Tobacco Use: High Risk (10/06/2022)   SDOH Interventions:     Readmission Risk Interventions     No data to display

## 2022-10-06 NOTE — Anesthesia Procedure Notes (Signed)
Procedure Name: Intubation Date/Time: 10/06/2022 8:10 AM  Performed by: Santa Lighter, MDPre-anesthesia Checklist: Patient identified, Emergency Drugs available, Suction available and Patient being monitored Patient Re-evaluated:Patient Re-evaluated prior to induction Oxygen Delivery Method: Circle system utilized Preoxygenation: Pre-oxygenation with 100% oxygen Induction Type: IV induction Ventilation: Mask ventilation without difficulty Laryngoscope Size: Mac and 4 Grade View: Grade II Tube type: Oral Tube size: 8.5 mm Number of attempts: 2 Airway Equipment and Method: Stylet and Oral airway Placement Confirmation: ETT inserted through vocal cords under direct vision, positive ETCO2 and breath sounds checked- equal and bilateral Tube secured with: Tape Dental Injury: Teeth and Oropharynx as per pre-operative assessment  Difficulty Due To: Difficulty was anticipated Future Recommendations: Recommend- induction with short-acting agent, and alternative techniques readily available Comments: Attempt x1 by CRNA with Grade 3 view. Attempt x1 by MDA with Grade 2 view, ETT passed atraumatically.

## 2022-10-06 NOTE — Anesthesia Procedure Notes (Signed)
Procedure Name: Intubation Date/Time: 10/06/2022 9:01 AM  Performed by: Santa Lighter, MDPre-anesthesia Checklist: Patient identified, Emergency Drugs available, Suction available and Patient being monitored Patient Re-evaluated:Patient Re-evaluated prior to induction Oxygen Delivery Method: Circle system utilized Preoxygenation: Pre-oxygenation with 100% oxygen Induction Type: IV induction Ventilation: Mask ventilation without difficulty Laryngoscope Size: Mac and 3 Grade View: Grade II Tube type: Oral Endobronchial tube: Left, EBT position confirmed by auscultation, Double lumen EBT and EBT position confirmed by fiberoptic bronchoscope and 37 Fr Number of attempts: 1 Airway Equipment and Method: Stylet and Oral airway Placement Confirmation: ETT inserted through vocal cords under direct vision, positive ETCO2 and breath sounds checked- equal and bilateral Secured at: 29 cm Tube secured with: Tape Dental Injury: Teeth and Oropharynx as per pre-operative assessment  Difficulty Due To: Difficulty was anticipated

## 2022-10-06 NOTE — Transfer of Care (Signed)
Immediate Anesthesia Transfer of Care Note  Patient: Julie King  Procedure(s) Performed: XI ROBOTIC ASSISTED THORACOSCOPY WEDGE RESECTION (Left: Chest) VIDEO BRONCHOSCOPY WITH ENDOBRONCHIAL ULTRASOUND INTERCOSTAL NERVE BLOCK (Left: Chest)  Patient Location: PACU  Anesthesia Type:General  Level of Consciousness: awake, drowsy, and patient cooperative  Airway & Oxygen Therapy: Patient Spontanous Breathing  Post-op Assessment: Report given to RN and Post -op Vital signs reviewed and stable  Post vital signs: Reviewed and stable  Last Vitals:  Vitals Value Taken Time  BP 171/94 10/06/22 1126  Temp    Pulse 93 10/06/22 1130  Resp 25 10/06/22 1130  SpO2 94% 10/06/22 1130  Vitals shown include unvalidated device data.  Last Pain:  Vitals:   10/06/22 0658  TempSrc: Oral  PainSc: 0-No pain      Patients Stated Pain Goal: 0 (27/87/18 3672)  Complications:  Encounter Notable Events  Notable Event Outcome Phase Comment  Difficult to intubate - expected  Intraprocedure Filed from anesthesia note documentation.

## 2022-10-06 NOTE — Brief Op Note (Addendum)
10/06/2022  10:56 AM  PATIENT:  Julie King  32 y.o. female  PRE-OPERATIVE DIAGNOSIS:  LEFT UPPER LOBE NODULE WITH EXTENSION INTO LOWER LOBE  POST-OPERATIVE DIAGNOSIS:  LEFT UPPER LOBE NODULE WITH EXTENSION INTO LOWER LOBE   PROCEDURE:  Procedure(s): XI ROBOTIC ASSISTED THORACOSCOPY WEDGE RESECTION (Left) VIDEO BRONCHOSCOPY WITH ENDOBRONCHIAL ULTRASOUND (N/A) INTERCOSTAL NERVE BLOCK (Left)  SURGEON:  Surgeon(s) and Role:    * Melrose Nakayama, MD - Primary  PHYSICIAN ASSISTANT: WAYNE GOLD PA-C  ASSISTANTS: none   ANESTHESIA:   general  EBL:  50 mL   BLOOD ADMINISTERED:none  DRAINS: (28 F) Blake drain(s) in the LEFT HEMITHORAX    LOCAL MEDICATIONS USED:  BUPIVICAINE  and OTHER EXPAREL  SPECIMEN:  Source of Specimen:  LEFT UPPER LOBE LINGULAR WEDGE RESECTION  DISPOSITION OF SPECIMEN:   PATHOLOGY AND MICROBIOLOGY  COUNTS:  YES  TOURNIQUET:  * No tourniquets in log *  DICTATION: .Other Dictation: Dictation Number [id number]  PLAN OF CARE: Admit to inpatient   PATIENT DISPOSITION:  PACU - hemodynamically stable.   Delay start of Pharmacological VTE agent (>24hrs) due to surgical blood loss or risk of bleeding: no  COMPLICATIONS: NO KNOWN  FINDINGS:  Level 7 and 11L nodes- lymphocytes, no malignancy Lung nodule- inflammation, no malignancy

## 2022-10-06 NOTE — Interval H&P Note (Signed)
History and Physical Interval Note:  PET reviewed. Cavitary mass hypermetabolic with a mildly hypermetabolic hilar node. No evidence of disease elsewhere.  10/06/2022 7:40 AM  Julie King  has presented today for surgery, with the diagnosis of LLL NODULE.  The various methods of treatment have been discussed with the patient and family. After consideration of risks, benefits and other options for treatment, the patient has consented to  Procedure(s): XI ROBOTIC ASSISTED THORACOSCOPY- possible WEDGE RESECTION OR SEGMENTECTOMY (Left) VIDEO BRONCHOSCOPY WITH ENDOBRONCHIAL ULTRASOUND (N/A) as a surgical intervention.  The patient's history has been reviewed, patient examined, no change in status, stable for surgery.  I have reviewed the patient's chart and labs.  Questions were answered to the patient's satisfaction.     Melrose Nakayama

## 2022-10-06 NOTE — Discharge Instructions (Signed)
obot-Assisted Thoracic Surgery, Care After The following information offers guidance on how to care for yourself after your procedure. Your health care provider may also give you more specific instructions. If you have problems or questions, contact your health care provider. What can I expect after the procedure? After the procedure, it is common to have: Some pain and aches in the area of your surgical incisions. Pain when breathing in (inhaling) and coughing. Tiredness (fatigue). Trouble sleeping. Constipation. Follow these instructions at home: Medicines Take over-the-counter and prescription medicines only as told by your health care provider. If you were prescribed an antibiotic medicine, take it as told by your health care provider. Do not stop taking the antibiotic even if you start to feel better. Talk with your health care provider about safe and effective ways to manage pain after your procedure. Pain management should fit your specific health needs. Take pain medicine before pain becomes severe. Relieving and controlling your pain will make breathing easier for you. Ask your health care provider if the medicine prescribed to you requires you to avoid driving or using machinery. Eating and drinking Follow instructions from your health care provider about eating or drinking restrictions. These will vary depending on what procedure you had. Your health care provider may recommend: A liquid diet or soft diet for the first few days. Meals that are smaller and more frequent. A diet of fruits, vegetables, whole grains, and low-fat proteins. Limiting foods that are high in fat and processed sugar, including fried or sweet foods. Incision care Follow instructions from your health care provider about how to take care of your incisions. Make sure you: Wash your hands with soap and water for at least 20 seconds before and after you change your bandage (dressing). If soap and water are not  available, use hand sanitizer. Change your dressing as told by your health care provider. Leave stitches (sutures), skin glue, or adhesive strips in place. These skin closures may need to stay in place for 2 weeks or longer. If adhesive strip edges start to loosen and curl up, you may trim the loose edges. Do not remove adhesive strips completely unless your health care provider tells you to do that. Check your incision area every day for signs of infection. Check for: Redness, swelling, or more pain. Fluid or blood. Warmth. Pus or a bad smell. Activity Return to your normal activities as told by your health care provider. Ask your health care provider what activities are safe for you. Ask your health care provider when it is safe for you to drive. Do not lift anything that is heavier than 10 lb (4.5 kg), or the limit that you are told, until your health care provider says that it is safe. Rest as told by your health care provider. Avoid sitting for a long time without moving. Get up to take short walks every 1-2 hours. This is important to improve blood flow and breathing. Ask for help if you feel weak or unsteady. Do exercises as told by your health care provider. Pneumonia prevention  Do deep breathing exercises and cough regularly as directed. This helps clear mucus and opens your lungs. Doing this helps prevent lung infection (pneumonia). If you were given an incentive spirometer, use it as told. An incentive spirometer is a tool that measures how well you are filling your lungs with each breath. Coughing may hurt less if you try to support your chest. This is called splinting. Try one of these when you  cough: Hold a pillow against your chest. Place the palms of both hands on top of your incision area. Do not use any products that contain nicotine or tobacco. These products include cigarettes, chewing tobacco, and vaping devices, such as e-cigarettes. If you need help quitting, ask your  health care provider. Avoid secondhand smoke. General instructions If you have a drainage tube: Follow instructions from your health care provider about how to take care of it. Do not travel by airplane after your tube is removed until your health care provider tells you it is safe. You may need to take these actions to prevent or treat constipation: Drink enough fluid to keep your urine pale yellow. Take over-the-counter or prescription medicines. Eat foods that are high in fiber, such as beans, whole grains, and fresh fruits and vegetables. Limit foods that are high in fat and processed sugars, such as fried or sweet foods. Keep all follow-up visits. This is important. Contact a health care provider if: You have redness, swelling, or more pain around an incision. You have fluid or blood coming from an incision. An incision feels warm to the touch. You have pus or a bad smell coming from an incision. You have a fever. You cannot eat or drink without vomiting. Your pain medicine is not controlling your pain. Get help right away if: You have chest pain. Your heart is beating quickly. You have trouble breathing. You have trouble speaking. You are confused. You feel weak or dizzy, or you faint. These symptoms may represent a serious problem that is an emergency. Do not wait to see if the symptoms will go away. Get medical help right away. Call your local emergency services (911 in the [place] Do not drive yourself to the hospital. Summary Talk with your health care provider about safe and effective ways to manage pain after your procedure. Pain management should fit your specific health needs. Return to your normal activities as told by your health care provider. Ask your health care provider what activities are safe for you. Do deep breathing exercises and cough regularly as directed. This helps to clear mucus and prevent pneumonia. If it hurts to cough, ease pain by holding a pillow  against your chest or by placing the palms of both hands over your incisions. This information is not intended to replace advice given to you by your health care provider. Make sure you discuss any questions you have with your health care provider. Document Revised: 05/08/2020 Document Reviewed: 05/08/2020 Elsevier Patient Education  [place].

## 2022-10-06 NOTE — Hospital Course (Addendum)
HPI: This is 32 year old woman with a history of tobacco abuse, morbid obesity, paroxysmal atrial fibrillation, and hypertension.  She presented back in October with hemoptysis and shortness of breath.  She initially was evaluated at [facility].  The CT showed a left lung mass in the lingula.  PET was done which showed the mass was hypermetabolic and there also was a hypermetabolic lymph node in the hilum.  She underwent bronchoscopy on 2 occasions.  Biopsies only showed inflammation.  There was no malignancy on either sampling.   She continues to have hemoptysis.  Also is having some musculoskeletal chest pain last week, which resulted in emergency room visit.  She is very frustrated by the lack of definitive answer and resolution.    It would be helpful if we could obtain another PET/CT . Dr. Roxan Hockey and the patient discussed the tentative plan to proceed with endobronchial ultrasound followed by robotic assisted left VATS for resection (wedge or multiple segmentectomy). Dr. Roxan Hockey informed her of the general nature of the procedure including the need for general anesthesia, the decision making, the incisions to be used, the use of the surgical robot, the use of drains to postoperatively, the expected hospital stay, and the overall recovery. PET was done on 02/07 and results showed intense metabolic activity associated with the biopsied lingular Mass, single hypermetabolic left mediastinal/hilar lymph node (indeterminate), and hypermetabolic cutaneous skin thickening in the RIGHT groin region is favored a benign cutaneous infection or inflammation.  Hospital Course: Patient underwent Xi robotic assisted left VATS, left upper lobe/lingular wedge resection. She was extubated in the OR and transferred to PACU in stable condition.  Chest tube was to water seal and there initially appeared to be a small, intermittent air leak with cough. However, with subsequent coughing, there was no air leak.  Daily  chest x rays were obtained and remained stable. Chest tube was removed on 02/09. Same day CXR showed no pneumothorax PA/LAT CXR showed exam to be stable.. She has been tolerating a diet. She initially required 2 liters of oxygen via [place] but was later weaned to room air. All wounds are clean, dry, and healing without signs of infection.  Pain was somewhat difficult to control but did improve over time.  She did have a minor expected acute blood loss anemia but it is noted she does have a component of chronicity.  It is stable and not in the transition threshold.  Renal function has remained within normal limits.  Overall, at the time of discharge the patient is felt to be quite stable.

## 2022-10-06 NOTE — Op Note (Signed)
NAMEBRIELLE, OZANNE MEDICAL RECORD NO: [id number] ACCOUNT NO: [id number] DATE OF BIRTH: 10/10/1990 FACILITY: [facility] LOCATION: [facility] PHYSICIAN: Revonda Standard. Roxan Hockey, MD  Operative Report   DATE OF PROCEDURE: 10/06/2022  PREOPERATIVE DIAGNOSIS:  Left lung nodule.  POSTOPERATIVE DIAGNOSIS:  Left lung nodule.  PROCEDURE:   Bronchoscopy,  Endobronchial ultrasound.   Robotic-assisted left VATS,  Wedge resection of lung nodule involving upper and lower lobes.   Intercostal nerve blocks levels 3 through 10.  SURGEON:  Revonda Standard. Roxan Hockey, MD  ASSISTANT:  Jadene Pierini. PA.  ANESTHESIA:  General.  FINDINGS:  Bronchoscopy revealed normal endobronchial anatomy with no endobronchial lesions.  Endobronchial ultrasound: aspirations of level 7 and level 11 L nodes negative for tumor.  Mass in the lingula crossing the fissure into the lower lobe resected  with grossly clear margins.  Frozen section showed no malignancy, likely infectious/inflammatory etiology.  CLINICAL NOTE: Julie King is a 32 year old woman who presented with hemoptysis.  Workup showed a lingular nodule that crossed the fissure and also involved the lower lobe.  There also was some adenopathy.  A PET/CT showed a high-grade activity in the lingular nodule and moderate activity in a hilar node.  She was advised to undergo endobronchial ultrasound for staging and then resection of the mass if no evidence of metastatic disease.  The indications, risks, benefits, and alternatives were  discussed in detail with the patient.  She understood and accepted the risks and agreed to proceed.  OPERATIVE NOTE: Julie King was brought to the preoperative holding area on 10/06/2022.  Anesthesia established intravenous access and placed an arterial blood pressure monitoring line.  She was taken to the operating room anesthetized and intubated.  Intravenous antibiotics were administered.  A Foley catheter was placed.  Sequential compression devices  were placed on the calves for DVT prophylaxis.  A Bair Hugger was placed for active warming.  A timeout was performed.  Flexible fiberoptic bronchoscopy was performed via the endotracheal tube, it revealed normal endobronchial anatomy with no endobronchial lesions to the level of subsegmental bronchi.  There were some thick yellowish secretions, which cleared with saline.  The endobronchial ultrasound probe was advanced.  Level 7 and level 11 nodes were both easily identifiable.  Needle aspirations were performed using a 21-gauge needle with realtime ultrasound visualization.  Samples were obtained, both with or without suction applied at each level.  Quick prep of those specimen showed lymphocytes, but no evidence of malignancy.  There was some minor bleeding, which stopped with topical application of a dilute epinephrine solution.   The bronchoscope was replaced.  The airways were cleared of any remaining secretions.  Ms. Mccone then was reintubated with a double lumen endotracheal tube.  She was placed in a right lateral decubitus position.  A Bair Hugger was placed for active warming.  The left chest was prepped and draped in the usual sterile fashion.  Single lung ventilation of the right lung was initiated.  The patient tolerated that well throughout the procedure.  A timeout was performed.  A solution containing 20 mL of liposomal bupivacaine, 30 mL of 0.5% bupivacaine and 50 mL of saline was prepared.  This solution was used for local at the incision sites as well as for the intercostal nerve blocks.  Incision was made in the seventh interspace in the mid axillary line, and an 8 mm robotic port was inserted.  The thoracoscope was advanced into the chest.  There was good isolation of the left lung.  A  12 mm port was placed anteriorly.  Intercostal nerve blocks then were performed from the third to the tenth interspace by injecting 10 mL of the bupivacaine solution into a subpleural plane at each level.   Two additional eighth interspace robotic ports  were placed and a 12 mm AirSeal port was placed in the tenth interspace.  The robot was deployed.  The camera arm was docked, and targeting was performed.  The remaining arms were docked.  Robotic instruments were inserted with thoracoscopic visualization.  Inspection of the lung showed a well-defined fissure. Anteriorly there was an area where the mass was clearly visible in the lingula and extending across the fissure involving the lower lobe.  A wedge resection was performed with sequential firings of  the robotic stapler using both blue and green cartridges.  The lingula was divided first and a greater than 1 cm gross margin was obtained and then the lower lobe wedge was completed as well also maintaining the margin.  The specimen was placed into an endoscopic retrieval bag, removed, and sent for frozen section. Before sending the specimen to pathology it was opened on the back table. Tissue from the wall of the mass was sent for AFB and fungal cultures and also sent for bacterial cultures with instructions to hold for fastidious organisms.  Inspection of the staple line showed good hemostasis.  The frozen section returned showing no evidence of malignancy.  There was extensive inflammation as well as some lymphoid tissue.  The robotic ports  were removed.  A 28-French Blake drain was placed through the anterior port incision and secured with a #1 silk suture.  Dual lung ventilation was resumed.  The remaining incisions were closed in standard fashion.  Dermabond was applied to the skin  closures.  The chest tube was placed to a Pleur-Evac on waterseal.  The patient was placed back in a supine position.  She was extubated in the operating room and taken to the postanesthetic care unit in good condition.  All sponge, needle and instrument counts were correct at the end of the procedure.  Experienced assistance was necessary for this case due to surgical  complexity.  Jadene Pierini, [place] served as the Public relations account executive with port placement, docking and undocking the robot, instrument exchange, specimen retrieval, and wound closure.   PUS D: 10/06/2022 11:54:17 am T: 10/06/2022 1:09:00 pm  JOB: [id number]

## 2022-10-06 NOTE — Anesthesia Postprocedure Evaluation (Signed)
Anesthesia Post Note  Patient: Julie King  Procedure(s) Performed: XI ROBOTIC ASSISTED THORACOSCOPY WEDGE RESECTION (Left: Chest) VIDEO BRONCHOSCOPY WITH ENDOBRONCHIAL ULTRASOUND INTERCOSTAL NERVE BLOCK (Left: Chest)     Patient location during evaluation: PACU Anesthesia Type: General Level of consciousness: awake and alert Pain management: pain level controlled Vital Signs Assessment: post-procedure vital signs reviewed and stable Respiratory status: spontaneous breathing, nonlabored ventilation, respiratory function stable and patient connected to nasal cannula oxygen Cardiovascular status: blood pressure returned to baseline and stable Postop Assessment: no apparent nausea or vomiting Anesthetic complications: yes (Right eye corneal abrasion)   Encounter Notable Events  Notable Event Outcome Phase Comment  Difficult to intubate - expected  Intraprocedure Filed from anesthesia note documentation.    Last Vitals:  Vitals:   10/06/22 1500 10/06/22 1515  BP: 136/78   Pulse: 83 88  Resp: 13 14  Temp:    SpO2: 98% 99%    Last Pain:  Vitals:   10/06/22 1445  TempSrc:   PainSc: [place]

## 2022-10-06 NOTE — Discharge Summary (Addendum)
[address]       [place] [address]             [phone]    Physician Discharge Summary  Patient ID: Julie King MRN: [id number] DOB/AGE: 32-16-1992 32 y.o.  Admit date: 10/06/2022 Discharge date: 10/10/2022  Admission Diagnoses: Cavitary left lung mass  Patient Active Problem List   Diagnosis Date Noted   Mass of left lung 10/06/2022   Hemoptysis 08/28/2022   Lung mass 08/28/2022   Family history of breast cancer    Essential hypertension 03/16/2020   History of atrial fibrillation 11/28/2019   Headache 07/25/2017   Morbidly obese ([place]) 07/25/2017     Discharge Diagnoses: Left lung abscess Patient Active Problem List   Diagnosis Date Noted   Mass of left lung 10/06/2022   Hemoptysis 08/28/2022   Lung mass 08/28/2022   Family history of breast cancer    Essential hypertension 03/16/2020   History of atrial fibrillation 11/28/2019   Headache 07/25/2017   Morbidly obese ([place]) 07/25/2017     Discharged Condition: stable  HPI: This is 32 year old woman with a history of tobacco abuse, morbid obesity, paroxysmal atrial fibrillation, and hypertension.  She presented back in October with hemoptysis and shortness of breath.  She initially was evaluated at [facility].  The CT showed a left lung mass in the lingula.  PET was done which showed the mass was hypermetabolic and there also was a hypermetabolic lymph node in the hilum.  She underwent bronchoscopy on 2 occasions.  Biopsies only showed inflammation.  There was no malignancy on either sampling.   She continues to have hemoptysis.  Also is having some musculoskeletal chest pain last week, which resulted in emergency room visit.  She is very frustrated by the lack of definitive answer and resolution.    It would be helpful if we could obtain another PET/CT . Dr. Roxan Hockey and the patient discussed the tentative plan to proceed with endobronchial ultrasound followed by robotic assisted  left VATS for resection (wedge or multiple segmentectomy). Dr. Roxan Hockey informed her of the general nature of the procedure including the need for general anesthesia, the decision making, the incisions to be used, the use of the surgical robot, the use of drains to postoperatively, the expected hospital stay, and the overall recovery. PET was done on 02/07 and results showed intense metabolic activity associated with the biopsied lingular Mass, single hypermetabolic left mediastinal/hilar lymph node (indeterminate), and hypermetabolic cutaneous skin thickening in the RIGHT groin region is favored a benign cutaneous infection or inflammation.  Hospital Course: Patient underwent Xi robotic assisted left VATS, left upper lobe/lingular wedge resection. She was extubated in the OR and transferred to PACU in stable condition.  Chest tube was to water seal and there initially appeared to be a small, intermittent air leak with cough. However, with subsequent coughing, there was no air leak.  Daily chest x rays were obtained and remained stable. Chest tube was removed on 02/09. Same day CXR showed no pneumothorax PA/LAT CXR showed exam to be stable.. She has been tolerating a diet. She initially required 2 liters of oxygen via [place] but was later weaned to room air. All wounds are clean, dry, and healing without signs of infection.  Pain was somewhat difficult to control but did improve over time.  She did have a minor expected acute blood loss anemia but it is noted she does have a component of chronicity.  It is stable  and not in the transition threshold.  Renal function has remained within normal limits.  Overall, at the time of discharge the patient is felt to be quite stable.  Consults: None  Significant Diagnostic Studies:  Narrative & Impression  CLINICAL DATA:  Left lung mass.   EXAM: PORTABLE CHEST 1 VIEW   COMPARISON:  10/04/2022   FINDINGS: Interval placement of right IJ central venous catheter  with tip over the SVC. Lungs are hypoinflated with bilateral perihilar opacification likely atelectasis and less likely infection. No significant effusion. No pneumothorax. Again noted is patient's lingular mass unchanged. Cardiomediastinal silhouette and remainder of the exam is unchanged.   IMPRESSION: 1. Hypoinflation with bilateral perihilar opacification likely atelectasis and less likely infection. 2. Stable lingular mass. 3. Right IJ central venous catheter with tip over the SVC.     Electronically Signed   By: Marin Olp M.D.   On: 10/06/2022 11:51    Results for orders placed or performed during the hospital encounter of 10/06/22 (from the past 72 hour(s))  CBC     Status: Abnormal   Collection Time: 10/08/22  6:15 AM  Result Value Ref Range   WBC 11.0 (H) 4.0 - 10.5 K/uL   RBC 4.54 3.87 - 5.11 MIL/uL   Hemoglobin 8.2 (L) 12.0 - 15.0 g/dL    Comment: Reticulocyte Hemoglobin testing may be clinically indicated, consider ordering this additional test [id number]    HCT 27.1 (L) 36.0 - 46.0 %   MCV 59.7 (L) 80.0 - 100.0 fL   MCH 18.1 (L) 26.0 - 34.0 pg   MCHC 30.3 30.0 - 36.0 g/dL   RDW 21.7 (H) 11.5 - 15.5 %   Platelets 213 150 - 400 K/uL    Comment: REPEATED TO VERIFY   nRBC 0.2 0.0 - 0.2 %    Comment: Performed at [facility], [place] [address], [place], [place] [address]  Comprehensive metabolic panel     Status: Abnormal   Collection Time: 10/08/22  6:15 AM  Result Value Ref Range   Sodium 139 135 - 145 mmol/L   Potassium 3.6 3.5 - 5.1 mmol/L   Chloride 107 98 - 111 mmol/L   CO2 24 22 - 32 mmol/L   Glucose, Bld 99 70 - 99 mg/dL    Comment: Glucose reference range applies only to samples taken after fasting for at least 8 hours.   BUN 10 6 - 20 mg/dL   Creatinine, Ser 0.76 0.44 - 1.00 mg/dL   Calcium 8.3 (L) 8.9 - 10.3 mg/dL   Total Protein 6.0 (L) 6.5 - 8.1 g/dL   Albumin 2.8 (L) 3.5 - 5.0 g/dL   AST 14 (L) 15 - 41 U/L   ALT 11 0 - 44 U/L    Alkaline Phosphatase 46 38 - 126 U/L   Total Bilirubin 0.5 0.3 - 1.2 mg/dL   GFR, Estimated >60 >60 mL/min    Comment: (NOTE) Calculated using the CKD-EPI Creatinine Equation (2021)    Anion gap 8 5 - 15    Comment: Performed at [facility], [place] [address] [place], [place] [address]    Treatments: surgery:  Bronchoscopy, endobronchial ultrasound. Robotic-assisted left VATS, wedge resection of lung nodule involving upper and lower lobes. Intercostal nerve blocks levels 3 through 10 by Dr. Roxan Hockey on 10/06/2022.  Pathology: Pending as is microbiology cultures  Discharge Exam: Blood pressure 133/89, pulse 74, temperature 98.3 F (36.8 C), temperature source Oral, resp. rate (!) 24, last menstrual period  09/30/2022, SpO2 99 %.   General appearance: alert, cooperative, and no distress Heart: regular rate and rhythm Lungs: clear to auscultation bilaterally Abdomen: benign Extremities: some LE swellling Wound: incis healing well  Discharge Medications: Discharge Instructions     Discharge patient   Complete by: As directed    Discharge disposition: 01-Home or Self Care   Discharge patient date: 10/09/2022      Allergies as of 10/09/2022       Reactions   Percocet [oxycodone-acetaminophen] Hives, Itching, Nausea Only   Pineapple Itching   Fresh pineapples; pineapples from a can is okay        Medication List     TAKE these medications    albuterol 108 (90 Base) MCG/ACT inhaler Commonly known as: VENTOLIN HFA Inhale 2 puffs into the lungs every 6 (six) hours as needed for wheezing or shortness of breath (Cough).   benzonatate 200 MG capsule Commonly known as: TESSALON Take 1 capsule (200 mg total) by mouth 3 (three) times daily.   chlorpheniramine-HYDROcodone 10-8 MG/5ML Commonly known as: TUSSIONEX Take 5 mLs by mouth every 12 (twelve) hours as needed for cough. What changed: Another medication with the same name was removed. Continue taking this  medication, and follow the directions you see here.   diclofenac Sodium 1 % Gel Commonly known as: VOLTAREN Apply 4 g topically 4 (four) times daily.   fluticasone 50 MCG/ACT nasal spray Commonly known as: FLONASE Place 1 spray into both nostrils daily.   gabapentin 300 MG capsule Commonly known as: NEURONTIN Take 1 capsule (300 mg total) by mouth 3 (three) times daily.   HYDROmorphone 2 MG tablet Commonly known as: DILAUDID Take 1 tablet (2 mg total) by mouth every 6 (six) hours as needed for up to 7 days for severe pain.   ipratropium-albuterol 0.5-2.5 (3) MG/3ML Soln Commonly known as: DUONEB Take 3 mLs by nebulization 4 (four) times daily. What changed:  when to take this reasons to take this   mometasone-formoterol 200-5 MCG/ACT Aero Commonly known as: DULERA Inhale 2 puffs into the lungs 2 (two) times daily.   nicotine 14 mg/24hr patch Commonly known as: NICODERM CQ - dosed in mg/24 hours Place 1 patch (14 mg total) onto the skin daily as needed (smoking cessation).        Follow-up Information     [facility] Follow up.   Contact information: [place]        Melrose Nakayama, MD Follow up.   Specialty: Cardiothoracic Surgery Contact information: [address] [place] [address] [phone]                 Signed:  John Giovanni, Hershal Coria 10/10/2022, 12:09 PM

## 2022-10-07 ENCOUNTER — Inpatient Hospital Stay (HOSPITAL_COMMUNITY): Payer: Medicaid Other

## 2022-10-07 ENCOUNTER — Encounter (HOSPITAL_COMMUNITY): Payer: Self-pay | Admitting: Thoracic Surgery (Cardiothoracic Vascular Surgery)

## 2022-10-07 LAB — BASIC METABOLIC PANEL
Anion gap: 10 (ref 5–15)
BUN: 5 mg/dL — ABNORMAL LOW (ref 6–20)
CO2: 24 mmol/L (ref 22–32)
Calcium: 8.4 mg/dL — ABNORMAL LOW (ref 8.9–10.3)
Chloride: 102 mmol/L (ref 98–111)
Creatinine, Ser: 0.7 mg/dL (ref 0.44–1.00)
GFR, Estimated: >60 mL/min (ref >60)
Glucose, Bld: 165 mg/dL — ABNORMAL HIGH (ref 70–99)
Potassium: 3.9 mmol/L (ref 3.5–5.1)
Sodium: 136 mmol/L (ref 135–145)

## 2022-10-07 LAB — CBC
HCT: 31 % — ABNORMAL LOW (ref 36.0–46.0)
Hemoglobin: 9.3 g/dL — ABNORMAL LOW (ref 12.0–15.0)
MCH: 17.7 pg — ABNORMAL LOW (ref 26.0–34.0)
MCHC: 30 g/dL (ref 30.0–36.0)
MCV: 59.2 fL — ABNORMAL LOW (ref 80.0–100.0)
Platelets: 258 10*3/uL (ref 150–400)
RBC: 5.24 MIL/uL — ABNORMAL HIGH (ref 3.87–5.11)
RDW: 22.1 % — ABNORMAL HIGH (ref 11.5–15.5)
WBC: 16.4 10*3/uL — ABNORMAL HIGH (ref 4.0–10.5)
nRBC: 0.1 % (ref 0.0–0.2)

## 2022-10-07 LAB — CYTOLOGY - NON PAP

## 2022-10-07 MED ORDER — GABAPENTIN 300 MG PO CAPS
300.0000 mg | ORAL_CAPSULE | Freq: Two times a day (BID) | ORAL | Status: DC
  Administered 2022-10-07 – 2022-10-09 (×5): 300 mg via ORAL
  Filled 2022-10-07 (×5): qty 1

## 2022-10-07 MED ORDER — POTASSIUM CHLORIDE CRYS ER 20 MEQ PO TBCR
30.0000 meq | EXTENDED_RELEASE_TABLET | Freq: Once | ORAL | Status: AC
  Administered 2022-10-07: 30 meq via ORAL
  Filled 2022-10-07: qty 1

## 2022-10-07 MED ORDER — KETOROLAC TROMETHAMINE 15 MG/ML IJ SOLN
30.0000 mg | Freq: Four times a day (QID) | INTRAMUSCULAR | Status: AC
  Administered 2022-10-07 – 2022-10-08 (×4): 30 mg via INTRAVENOUS
  Filled 2022-10-07 (×4): qty 2

## 2022-10-07 MED ORDER — FENTANYL CITRATE PF 50 MCG/ML IJ SOSY
50.0000 ug | PREFILLED_SYRINGE | INTRAMUSCULAR | Status: DC | PRN
  Administered 2022-10-07 – 2022-10-08 (×4): 75 ug via INTRAVENOUS
  Administered 2022-10-08: 50 ug via INTRAVENOUS
  Filled 2022-10-07 (×5): qty 2

## 2022-10-07 NOTE — Progress Notes (Signed)
Mobility Specialist Progress Note:   10/07/22 1501  Mobility  Activity Ambulated with assistance in hallway  Level of Assistance Standby assist, set-up cues, supervision of patient - no hands on  Assistive Device Front wheel walker  Distance Ambulated (ft) 150 ft  Activity Response Tolerated fair  Mobility Referral Yes  $Mobility charge 1 Mobility   Pt received in bed and agreeable. C/o pain throughout session, rating not given. Pt returned to bathroom with all needs met, call bell in reach, and NT in room.   Andrey Campanile Mobility Specialist Please contact via [employer] or  Rehab office at [phone]

## 2022-10-07 NOTE — Progress Notes (Addendum)
      [address]       [place] [address]             [phone]       1 Day Post-Op Procedure(s) (LRB): XI ROBOTIC ASSISTED THORACOSCOPY WEDGE RESECTION (Left) VIDEO BRONCHOSCOPY WITH ENDOBRONCHIAL ULTRASOUND (N/A) INTERCOSTAL NERVE BLOCK (Left)  Subjective: Patient with incisional/chest tube pain this am. She is tolerating a diet without nausea/vomiting. She has numbness across left chest (nerve block)  Objective: Vital signs in last 24 hours: Temp:  [97.7 F (36.5 C)-98.8 F (37.1 C)] 98.7 F (37.1 C) (02/09 0355) Pulse Rate:  [56-104] 104 (02/08 2000) Cardiac Rhythm: Normal sinus rhythm (02/08 2000) Resp:  [12-22] 22 (02/08 2000) BP: (135-171)/(72-94) 135/82 (02/08 2000) SpO2:  [93 %-99 %] 98 % (02/08 2000) Arterial Line BP: (124-182)/(66-86) 141/78 (02/08 1500)     Intake/Output from previous day: 02/08 0701 - 02/09 0700 In: 1940 [P.O.:240; I.V.:1700] Out: 2970 [Urine:2605; Blood:50; Chest Tube:315]   Physical Exam:  Cardiovascular: RRR Pulmonary: Clear to auscultation bilaterally Abdomen: Soft, obese, non tender, bowel sounds present. Extremities: SCDs in place Wounds: Clean and dry.  No erythema or signs of infection. Chest Tube: to water seal, small air leak with cough  Lab Results: CBC: Recent Labs    10/04/22 1400 10/07/22 0030  WBC 11.5* 16.4*  HGB 9.8* 9.3*  HCT 32.3* 31.0*  PLT 259 258   BMET:  Recent Labs    10/04/22 1400 10/07/22 0030  NA 140 136  K 3.8 3.9  CL 107 102  CO2 21* 24  GLUCOSE 94 165*  BUN 7 5*  CREATININE 0.90 0.70  CALCIUM 9.0 8.4*    PT/INR:  Recent Labs    10/04/22 1400  LABPROT 14.4  INR 1.1   ABG:  INR: Will add last result for INR, ABG once components are confirmed Will add last 4 CBG results once components are confirmed  Assessment/Plan:  1. CV - History of a fib. SR this am. 2.  Pulmonary - On 2 liters of oxygen via [place]. Wean as able. Chest tube with  315 cc since surgery.  Chest tube is to water seal, air leak. CXR this am appears stable (? Small left apical ptx). Gram stain shows no organisms and culture is pending. Await final pathology. Encourage incentive spirometer 3. Supplement potassium 4. Heplock IVF, remove foley 5. History of anemia/expected post op blood loss-H and H this am 9.3 and 31. 6. On Lovenox for DVT prophylaxis 7. Regarding pain control, will increase Toradol to 30 mg IV Q 6, continue Ultram PRN (Percocet etc causes hives, nausea), and Neurontin. May have to increase Neurontin to bid if pain not better controlled with above regimen  Donielle M ZimmermanPA-C 10/07/2022,6:54 AM  Patient seen and examined, agree with above I watched tube for almost 10 minutes with multiple good coughs, no significant air leak I/O show 465 from CT but only about 300 ml in canister Will dc chest tube [employer] C. Roxan Hockey, MD [facility] [phone]

## 2022-10-07 NOTE — Progress Notes (Signed)
Pt educated on incentive spirometer use. Pt demonstrated successfully.

## 2022-10-08 ENCOUNTER — Inpatient Hospital Stay (HOSPITAL_COMMUNITY): Payer: Medicaid Other

## 2022-10-08 LAB — COMPREHENSIVE METABOLIC PANEL
ALT: 11 U/L (ref 0–44)
AST: 14 U/L — ABNORMAL LOW (ref 15–41)
Albumin: 2.8 g/dL — ABNORMAL LOW (ref 3.5–5.0)
Alkaline Phosphatase: 46 U/L (ref 38–126)
Anion gap: 8 (ref 5–15)
BUN: 10 mg/dL (ref 6–20)
CO2: 24 mmol/L (ref 22–32)
Calcium: 8.3 mg/dL — ABNORMAL LOW (ref 8.9–10.3)
Chloride: 107 mmol/L (ref 98–111)
Creatinine, Ser: 0.76 mg/dL (ref 0.44–1.00)
GFR, Estimated: >60 mL/min (ref >60)
Glucose, Bld: 99 mg/dL (ref 70–99)
Potassium: 3.6 mmol/L (ref 3.5–5.1)
Sodium: 139 mmol/L (ref 135–145)
Total Bilirubin: 0.5 mg/dL (ref 0.3–1.2)
Total Protein: 6 g/dL — ABNORMAL LOW (ref 6.5–8.1)

## 2022-10-08 LAB — TYPE AND SCREEN
ABO/RH(D): A POS
Antibody Screen: POSITIVE
Unit division: 0
Unit division: 0

## 2022-10-08 LAB — CBC
HCT: 27.1 % — ABNORMAL LOW (ref 36.0–46.0)
Hemoglobin: 8.2 g/dL — ABNORMAL LOW (ref 12.0–15.0)
MCH: 18.1 pg — ABNORMAL LOW (ref 26.0–34.0)
MCHC: 30.3 g/dL (ref 30.0–36.0)
MCV: 59.7 fL — ABNORMAL LOW (ref 80.0–100.0)
Platelets: 213 10*3/uL (ref 150–400)
RBC: 4.54 MIL/uL (ref 3.87–5.11)
RDW: 21.7 % — ABNORMAL HIGH (ref 11.5–15.5)
WBC: 11 10*3/uL — ABNORMAL HIGH (ref 4.0–10.5)
nRBC: 0.2 % (ref 0.0–0.2)

## 2022-10-08 LAB — BPAM RBC
Blood Product Expiration Date: 202403022359
Blood Product Expiration Date: 202403052359
Unit Type and Rh: 6200
Unit Type and Rh: 6200

## 2022-10-08 LAB — ACID FAST SMEAR (AFB, MYCOBACTERIA): Acid Fast Smear: NEGATIVE

## 2022-10-08 MED ORDER — HYDROMORPHONE HCL 2 MG PO TABS
2.0000 mg | ORAL_TABLET | ORAL | Status: DC | PRN
  Administered 2022-10-08 – 2022-10-09 (×5): 2 mg via ORAL
  Filled 2022-10-08 (×5): qty 1

## 2022-10-08 NOTE — Plan of Care (Signed)
  Problem: Education: Goal: Knowledge of disease or condition will improve Outcome: Progressing Goal: Knowledge of the prescribed therapeutic regimen will improve Outcome: Progressing   Problem: Respiratory: Goal: Respiratory status will improve Outcome: Progressing   Problem: Skin Integrity: Goal: Wound healing without signs and symptoms infection will improve Outcome: Progressing   Problem: Clinical Measurements: Goal: Respiratory complications will improve Outcome: Progressing   Problem: Pain Managment: Goal: General experience of comfort will improve Outcome: Progressing

## 2022-10-08 NOTE — Progress Notes (Signed)
Mobility Specialist Progress Note:   10/08/22 1329  Mobility  Activity Ambulated with assistance in hallway  Level of Assistance Standby assist, set-up cues, supervision of patient - no hands on  Assistive Device Front wheel walker  Distance Ambulated (ft) 80 ft  Activity Response Tolerated well  $Mobility charge 1 Mobility   Pt in bed willing to participate in mobility. No complaints of pain. Left in bed with call bell in reach and all needs met.   Julie King Mobility Specialist Please contact via [employer] or  Rehab Office at [phone]

## 2022-10-08 NOTE — Progress Notes (Signed)
Patient complaining about itching, burning, and brownish/irregular discharge from vaginal area. She does state she just finished with her period, so that could have something to do with the discharge that is unusual. The itching began suddenly about 1930-2000 this evening, following by the burning and stinging sensation. Patient voiced concern that could be from side effect of antibiotic.  Writer did examine vaginal area with no visual areas of redness, foreign body or any other obvious areas of concern found.   I let patient know that I would make physician aware, and most probably because of the time, it would be addressed in the morning by MD.

## 2022-10-08 NOTE — Progress Notes (Signed)
[address]       [place] [address]             [phone]     2 Days Post-Op Procedure(s) (LRB): XI ROBOTIC ASSISTED THORACOSCOPY WEDGE RESECTION (Left) VIDEO BRONCHOSCOPY WITH ENDOBRONCHIAL ULTRASOUND (N/A) INTERCOSTAL NERVE BLOCK (Left) Subjective: C/o pain  Objective: Vital signs in last 24 hours: Temp:  [98 F (36.7 C)-98.8 F (37.1 C)] 98 F (36.7 C) (02/10 0347) Pulse Rate:  [70-89] 79 (02/10 0839) Cardiac Rhythm: Normal sinus rhythm (02/10 0710) Resp:  [13-25] 17 (02/10 0839) BP: (134-149)/(77-89) 146/89 (02/10 0839) SpO2:  [93 %-99 %] 96 % (02/10 0839)  Hemodynamic parameters for last 24 hours:    Intake/Output from previous day: 02/09 0701 - 02/10 0700 In: 1332.2 [I.V.:1332.2] Out: -  Intake/Output this shift: Total I/O In: 240 [P.O.:240] Out: -   General appearance: alert, cooperative, and no distress Heart: regular rate and rhythm Lungs: clear to auscultation bilaterally Abdomen: benign Extremities: no edema Wound: incis healing well  Lab Results: Recent Labs    10/07/22 0030 10/08/22 0615  WBC 16.4* 11.0*  HGB 9.3* 8.2*  HCT 31.0* 27.1*  PLT 258 213   BMET:  Recent Labs    10/07/22 0030 10/08/22 0615  NA 136 139  K 3.9 3.6  CL 102 107  CO2 24 24  GLUCOSE 165* 99  BUN 5* 10  CREATININE 0.70 0.76  CALCIUM 8.4* 8.3*    PT/INR: No results for input(s): "LABPROT", "INR" in the last 72 hours. ABG    Component Value Date/Time   PHART 7.43 10/04/2022 1406   HCO3 25.2 10/04/2022 1406   TCO2 25 06/28/2021 1106   O2SAT 100 10/04/2022 1406   CBG (last 3)  No results for input(s): "GLUCAP" in the last 72 hours.  Meds Scheduled Meds:  acetaminophen  1,000 mg Oral Q6H   Or   acetaminophen (TYLENOL) oral liquid 160 mg/5 mL  1,000 mg Oral Q6H   benzonatate  200 mg Oral TID   bisacodyl  10 mg Oral Daily   Chlorhexidine Gluconate Cloth  6 each Topical Daily   diclofenac Sodium  4 g Topical QID   enoxaparin  (LOVENOX) injection  40 mg Subcutaneous Daily   fluticasone  1 spray Each Nare Daily   gabapentin  300 mg Oral BID   mometasone-formoterol  2 puff Inhalation BID   pantoprazole  40 mg Oral Daily   senna-docusate  1 tablet Oral QHS   Continuous Infusions:  dextrose 5 % and 0.9% NaCl Stopped (10/07/22 2032)   PRN Meds:.chlorpheniramine-HYDROcodone, fentaNYL (SUBLIMAZE) injection, ipratropium-albuterol, ondansetron (ZOFRAN) IV, traMADol  Xrays DG Chest 2 View  Result Date: 10/08/2022 CLINICAL DATA:  Status post wedge resection of lung nodule involving the LEFT UPPER and LOWER lobes. EXAM: CHEST - 2 VIEW COMPARISON:  10/07/2022 and prior studies FINDINGS: A RIGHT IJ central venous catheter with tip overlying the SVC again noted. The cardiomediastinal silhouette is grossly unchanged. LEFT surgical changes are again noted with continued bilateral mid-lower lung opacities/atelectasis. There is no evidence of pneumothorax. Little significant change is noted since the prior study. IMPRESSION: Unchanged appearance of the chest with continued bilateral mid-lower lung opacities/atelectasis. No evidence of pneumothorax. Electronically Signed   By: Margarette Canada M.D.   On: 10/08/2022 08:23   DG Chest 1V REPEAT Same Day  Result Date: 10/07/2022 CLINICAL DATA:  Encounter for chest tube removal. EXAM: CHEST - 1 VIEW SAME DAY COMPARISON:  10/07/2022.  FINDINGS: 1157 hours. Interval removal of the left sided chest tube. No pneumothorax. Unchanged right IJ approach central venous catheter with tip projecting over the mid SVC. Similar linear retrocardiac right mid lung opacities, consistent with atelectasis. Persistent mild blunting of the left costophrenic sulcus. Stable cardiac and mediastinal contours. IMPRESSION: Interval removal of the left-sided chest tube.  No pneumothorax. Electronically Signed   By: Emmit Alexanders M.D.   On: 10/07/2022 12:29   DG Chest Port 1 View  Result Date: 10/07/2022 CLINICAL DATA:  Mass  of left lung and post resection. EXAM: PORTABLE CHEST 1 VIEW COMPARISON:  10/06/2022 FINDINGS: Right jugular central line with the tip in the upper SVC region. Heart size is stable. Patchy perihilar and basilar densities most likely represent atelectasis. Poorly visualized left chest tube appears to be stable. Question a tiny left apical pneumothorax. No evidence for a large pneumothorax. IMPRESSION: 1. Stable position of the left chest tube. Difficult to exclude a tiny left apical pneumothorax. 2. Patchy densities in both lungs are most compatible with atelectasis. Electronically Signed   By: Markus Daft M.D.   On: 10/07/2022 07:39   DG Chest Port 1 View  Result Date: 10/06/2022 CLINICAL DATA:  Left lung mass. EXAM: PORTABLE CHEST 1 VIEW COMPARISON:  10/04/2022 FINDINGS: Interval placement of right IJ central venous catheter with tip over the SVC. Lungs are hypoinflated with bilateral perihilar opacification likely atelectasis and less likely infection. No significant effusion. No pneumothorax. Again noted is patient's lingular mass unchanged. Cardiomediastinal silhouette and remainder of the exam is unchanged. IMPRESSION: 1. Hypoinflation with bilateral perihilar opacification likely atelectasis and less likely infection. 2. Stable lingular mass. 3. Right IJ central venous catheter with tip over the SVC. Electronically Signed   By: Marin Olp M.D.   On: 10/06/2022 11:51    Assessment/Plan: S/P Procedure(s) (LRB): XI ROBOTIC ASSISTED THORACOSCOPY WEDGE RESECTION (Left) VIDEO BRONCHOSCOPY WITH ENDOBRONCHIAL ULTRASOUND (N/A) INTERCOSTAL NERVE BLOCK (Left) POD#2  1 afeb, s BP 130's-140's 2 sats ok on 1-2 liters 3 UOP not recorded 4 CXR appearance is stable 5 normal renal fxn 6 minor leukocytosis , trend improved 7 anemia- H/H a bit lower, no significant operative blood loss, was anemic preop  8 will stop fentanyl, add po dilaudid which she will likely go home on 9 rehab and pulm hygiene-  routine    LOS: 2 days    John Giovanni PA-C Pager [phone] 10/08/2022

## 2022-10-08 NOTE — Progress Notes (Signed)
SATURATION QUALIFICATIONS: (This note is used to comply with regulatory documentation for home oxygen)  Patient Saturations on Room Air at Rest = 93%  Patient Saturations on Room Air while Ambulating = 92%  Patient Saturations on 0 Liters of oxygen while Ambulating = 95%  Please briefly explain why patient needs home oxygen: No O2 needed.

## 2022-10-08 NOTE — Plan of Care (Signed)
  Problem: Education: Goal: Knowledge of disease or condition will improve Outcome: Progressing Goal: Knowledge of the prescribed therapeutic regimen will improve Outcome: Progressing   Problem: Activity: Goal: Risk for activity intolerance will decrease Outcome: Progressing   Problem: Cardiac: Goal: Will achieve and/or maintain hemodynamic stability Outcome: Progressing   Problem: Clinical Measurements: Goal: Postoperative complications will be avoided or minimized Outcome: Progressing   Problem: Respiratory: Goal: Respiratory status will improve Outcome: Progressing   Problem: Pain Management: Goal: Pain level will decrease Outcome: Progressing   Problem: Skin Integrity: Goal: Wound healing without signs and symptoms infection will improve Outcome: Progressing   Problem: Education: Goal: Knowledge of General Education information will improve Description: Including pain rating scale, medication(s)/side effects and non-pharmacologic comfort measures Outcome: Progressing

## 2022-10-08 NOTE — TOC Initial Note (Signed)
Transition of Care [facility]) - Initial/Assessment Note    Patient Details  Name: Julie King MRN: [id number] Date of Birth: 02/27/1991  Transition of Care [facility] CM/SW Contact:    Carles Collet, RN Phone Number: 10/08/2022, 2:54 PM  Clinical Narrative:                  Damaris Schooner w patient over the phone. She would like rollator, 3/1, and shower stool. Ordered bari DME to be delivered to her room through [employer]. She confirms that she will be able to transport equipment home. Requested nurse to obtain ambulatory sats to assess if patient will also need home oxygen.   Expected Discharge Plan: Home/Self Care Barriers to Discharge: Continued Medical Work up   Patient Goals and CMS Choice Patient states their goals for this hospitalization and ongoing recovery are:: return home          Expected Discharge Plan and Services   Discharge Planning Services: CM Consult Post Acute Care Choice: Durable Medical Equipment Living arrangements for the past 2 months: Apartment                 DME Arranged: 3-N-1, Shower stool, Walker rolling with seat DME Agency: [employer] Date DME Agency Contacted: 10/08/22 Time DME Agency Contacted: [id number] Representative spoke with at DME Agency: Brenton Grills            Prior Living Arrangements/Services Living arrangements for the past 2 months: Apartment                     Activities of Daily Living      Permission Sought/Granted                  Emotional Assessment              Admission diagnosis:  Mass of left lung [R91.8] Patient Active Problem List   Diagnosis Date Noted   Mass of left lung 10/06/2022   Hemoptysis 08/28/2022   Lung mass 08/28/2022   Family history of breast cancer    Essential hypertension 03/16/2020   History of atrial fibrillation 11/28/2019   Headache 07/25/2017   Morbidly obese ([place]) 07/25/2017   PCP:  Hattie Perch, MD Pharmacy:   [facility] [place] [place] -  [address] [facility] [address] [place] [address] Phone: [phone] Fax: [phone]     Social Determinants of Health (SDOH) Social History: SDOH Screenings   Food Insecurity: No Food Insecurity (08/29/2022)  Housing: Low Risk  (08/29/2022)  Transportation Needs: No Transportation Needs (08/29/2022)  Utilities: Not At Risk (08/29/2022)  Physical Activity: Inactive (11/09/2017)  Social Connections: Somewhat Isolated (11/09/2017)  Stress: No Stress Concern Present (11/09/2017)  Tobacco Use: High Risk (10/07/2022)   SDOH Interventions:     Readmission Risk Interventions     No data to display

## 2022-10-08 NOTE — Progress Notes (Signed)
Patient c/o SOB this morning after a dose of Fentanyl with O2 Sats around 93%. Oxygen 2L Media reapplied.

## 2022-10-09 MED ORDER — GABAPENTIN 300 MG PO CAPS: 300.0000 mg | ORAL_CAPSULE | Freq: Three times a day (TID) | ORAL | 1 refills | Status: DC

## 2022-10-09 MED ORDER — HYDROMORPHONE HCL 2 MG PO TABS: 2.0000 mg | ORAL_TABLET | Freq: Four times a day (QID) | ORAL | 0 refills | Status: DC | PRN

## 2022-10-09 MED ORDER — NICOTINE 14 MG/24HR TD PT24: 14.0000 mg | MEDICATED_PATCH | Freq: Every day | TRANSDERMAL | Status: DC | PRN

## 2022-10-09 MED ORDER — GABAPENTIN 300 MG PO CAPS: 300.0000 mg | ORAL_CAPSULE | Freq: Two times a day (BID) | ORAL | 1 refills | Status: DC

## 2022-10-09 NOTE — Plan of Care (Signed)
  Problem: Education: Goal: Knowledge of disease or condition will improve Outcome: Adequate for Discharge Goal: Knowledge of the prescribed therapeutic regimen will improve Outcome: Adequate for Discharge   Problem: Activity: Goal: Risk for activity intolerance will decrease Outcome: Adequate for Discharge   Problem: Cardiac: Goal: Will achieve and/or maintain hemodynamic stability Outcome: Adequate for Discharge   Problem: Clinical Measurements: Goal: Postoperative complications will be avoided or minimized Outcome: Adequate for Discharge   Problem: Respiratory: Goal: Respiratory status will improve Outcome: Adequate for Discharge

## 2022-10-09 NOTE — Progress Notes (Addendum)
3 Days Post-Op Procedure(s) (LRB): XI ROBOTIC ASSISTED THORACOSCOPY WEDGE RESECTION (Left) VIDEO BRONCHOSCOPY WITH ENDOBRONCHIAL ULTRASOUND (N/A) INTERCOSTAL NERVE BLOCK (Left) Subjective: Pain is reasonable, some nausea  Objective: Vital signs in last 24 hours: Temp:  [98.2 F (36.8 C)-98.7 F (37.1 C)] 98.3 F (36.8 C) (02/11 0746) Pulse Rate:  [71-89] 74 (02/11 0746) Cardiac Rhythm: Normal sinus rhythm (02/11 0800) Resp:  [13-24] 24 (02/11 0746) BP: (124-154)/(80-89) 133/89 (02/11 0746) SpO2:  [97 %-100 %] 99 % (02/11 0746)  Hemodynamic parameters for last 24 hours:    Intake/Output from previous day: 02/10 0701 - 02/11 0700 In: 480 [P.O.:480] Out: -  Intake/Output this shift: No intake/output data recorded.  General appearance: alert, cooperative, and no distress Heart: regular rate and rhythm Lungs: clear to auscultation bilaterally Abdomen: benign Extremities: some LE swellling Wound: incis healing well  Lab Results: Recent Labs    10/07/22 0030 10/08/22 0615  WBC 16.4* 11.0*  HGB 9.3* 8.2*  HCT 31.0* 27.1*  PLT 258 213   BMET:  Recent Labs    10/07/22 0030 10/08/22 0615  NA 136 139  K 3.9 3.6  CL 102 107  CO2 24 24  GLUCOSE 165* 99  BUN 5* 10  CREATININE 0.70 0.76  CALCIUM 8.4* 8.3*    PT/INR: No results for input(s): "LABPROT", "INR" in the last 72 hours. ABG    Component Value Date/Time   PHART 7.43 10/04/2022 1406   HCO3 25.2 10/04/2022 1406   TCO2 25 06/28/2021 1106   O2SAT 100 10/04/2022 1406   CBG (last 3)  No results for input(s): "GLUCAP" in the last 72 hours.  Meds Scheduled Meds:  acetaminophen  1,000 mg Oral Q6H   Or   acetaminophen (TYLENOL) oral liquid 160 mg/5 mL  1,000 mg Oral Q6H   benzonatate  200 mg Oral TID   bisacodyl  10 mg Oral Daily   Chlorhexidine Gluconate Cloth  6 each Topical Daily   diclofenac Sodium  4 g Topical QID   enoxaparin (LOVENOX) injection  40 mg Subcutaneous Daily   fluticasone  1 spray  Each Nare Daily   gabapentin  300 mg Oral BID   mometasone-formoterol  2 puff Inhalation BID   pantoprazole  40 mg Oral Daily   senna-docusate  1 tablet Oral QHS   Continuous Infusions:  dextrose 5 % and 0.9% NaCl Stopped (10/07/22 2032)   PRN Meds:.chlorpheniramine-HYDROcodone, HYDROmorphone, ipratropium-albuterol, ondansetron (ZOFRAN) IV, traMADol  Xrays DG Chest 2 View  Result Date: 10/08/2022 CLINICAL DATA:  Status post wedge resection of lung nodule involving the LEFT UPPER and LOWER lobes. EXAM: CHEST - 2 VIEW COMPARISON:  10/07/2022 and prior studies FINDINGS: A RIGHT IJ central venous catheter with tip overlying the SVC again noted. The cardiomediastinal silhouette is grossly unchanged. LEFT surgical changes are again noted with continued bilateral mid-lower lung opacities/atelectasis. There is no evidence of pneumothorax. Little significant change is noted since the prior study. IMPRESSION: Unchanged appearance of the chest with continued bilateral mid-lower lung opacities/atelectasis. No evidence of pneumothorax. Electronically Signed   By: Margarette Canada M.D.   On: 10/08/2022 08:23   DG Chest 1V REPEAT Same Day  Result Date: 10/07/2022 CLINICAL DATA:  Encounter for chest tube removal. EXAM: CHEST - 1 VIEW SAME DAY COMPARISON:  10/07/2022. FINDINGS: 1157 hours. Interval removal of the left sided chest tube. No pneumothorax. Unchanged right IJ approach central venous catheter with tip projecting over the mid SVC. Similar linear retrocardiac right mid lung opacities, consistent with  atelectasis. Persistent mild blunting of the left costophrenic sulcus. Stable cardiac and mediastinal contours. IMPRESSION: Interval removal of the left-sided chest tube.  No pneumothorax. Electronically Signed   By: Emmit Alexanders M.D.   On: 10/07/2022 12:29   Recent Results (from the past 240 hour(s))  Surgical pcr screen     Status: None   Collection Time: 10/04/22  1:38 PM   Specimen: Nasal Mucosa; Nasal  Swab  Result Value Ref Range Status   MRSA, PCR NEGATIVE NEGATIVE Final   Staphylococcus aureus NEGATIVE NEGATIVE Final    Comment: (NOTE) The Xpert SA Assay (FDA approved for NASAL specimens in patients 107 years of age and older), is one component of a comprehensive surveillance program. It is not intended to diagnose infection nor to guide or monitor treatment. Performed at [facility], [place] [address], [place], [place] [address]   SARS Coronavirus 2 by RT PCR (hospital order, performed in [facility] hospital lab) *cepheid single result test* Anterior Nasal Swab     Status: None   Collection Time: 10/04/22  1:38 PM   Specimen: Anterior Nasal Swab  Result Value Ref Range Status   SARS Coronavirus 2 by RT PCR NEGATIVE NEGATIVE Final    Comment: Performed at [facility], [place] [address] [place], [place] [address]  Acid Fast Smear (AFB)     Status: None   Collection Time: 10/06/22 10:17 AM   Specimen: PATH Soft tissue resection  Result Value Ref Range Status   AFB Specimen Processing Concentration  Final   Acid Fast Smear Negative  Final    Comment: (NOTE) Performed At: [facility] [place], [place] [id number] Rush Farmer MD [id number]    Source (AFB) TISSUE  Final    Comment: Performed at [facility], [place] [address] [place], [place] [address]  Aerobic/Anaerobic Culture w Gram Stain (surgical/deep wound)     Status: None (Preliminary result)   Collection Time: 10/06/22 10:21 AM   Specimen: PATH Soft tissue resection  Result Value Ref Range Status   Specimen Description TISSUE  Final   Special Requests  LUL WEDGE  Final   Gram Stain   Final    FEW WBC PRESENT,BOTH PMN AND MONONUCLEAR NO ORGANISMS SEEN Performed at [facility], [address], [place], [place] [address]    Culture   Final    FEW STREPTOCOCCUS INTERMEDIUS SUSCEPTIBILITIES TO FOLLOW NO ANAEROBES ISOLATED; CULTURE IN PROGRESS FOR 5 DAYS    Report Status  PENDING  Incomplete    Assessment/Plan: S/P Procedure(s) (LRB): XI ROBOTIC ASSISTED THORACOSCOPY WEDGE RESECTION (Left) VIDEO BRONCHOSCOPY WITH ENDOBRONCHIAL ULTRASOUND (N/A) INTERCOSTAL NERVE BLOCK (Left)  1 afeb, VSS, s BP 120's-150's, will need close long term management of metabolic syndrome 2 sats good on RA 3 no new labs or CXR's 4 stable for D/C, Path/micro- pending  LOS: 3 days    John Giovanni PA-C Pager [phone] 10/09/2022  Patient seen and examined, agree with above Home today  [facility]. Roxan Hockey, MD [facility] [phone]

## 2022-10-11 LAB — SURGICAL PATHOLOGY

## 2022-10-13 ENCOUNTER — Encounter (HOSPITAL_COMMUNITY): Payer: Self-pay

## 2022-10-13 ENCOUNTER — Emergency Department (HOSPITAL_COMMUNITY)
Admission: EM | Admit: 2022-10-13 | Discharge: 2022-10-13 | Disposition: A | Discharge status: Home / Self Care | Payer: Medicaid Other | Source: Home / Self Care | Attending: Emergency Medicine | Admitting: Emergency Medicine

## 2022-10-13 ENCOUNTER — Emergency Department (HOSPITAL_COMMUNITY): Payer: Medicaid Other

## 2022-10-13 ENCOUNTER — Other Ambulatory Visit: Payer: Self-pay

## 2022-10-13 ENCOUNTER — Encounter: Payer: Self-pay | Admitting: Thoracic Surgery (Cardiothoracic Vascular Surgery)

## 2022-10-13 DIAGNOSIS — D72829 Elevated white blood cell count, unspecified: Secondary | ICD-10-CM | POA: Insufficient documentation

## 2022-10-13 DIAGNOSIS — I10 Essential (primary) hypertension: Secondary | ICD-10-CM | POA: Insufficient documentation

## 2022-10-13 DIAGNOSIS — J40 Bronchitis, not specified as acute or chronic: Secondary | ICD-10-CM | POA: Insufficient documentation

## 2022-10-13 DIAGNOSIS — Z79899 Other long term (current) drug therapy: Secondary | ICD-10-CM | POA: Insufficient documentation

## 2022-10-13 DIAGNOSIS — G8918 Other acute postprocedural pain: Secondary | ICD-10-CM | POA: Insufficient documentation

## 2022-10-13 DIAGNOSIS — R0682 Tachypnea, not elsewhere classified: Secondary | ICD-10-CM | POA: Insufficient documentation

## 2022-10-13 DIAGNOSIS — R000 Tachycardia, unspecified: Secondary | ICD-10-CM | POA: Insufficient documentation

## 2022-10-13 LAB — CBC
HCT: 32.7 % — ABNORMAL LOW (ref 36.0–46.0)
Hemoglobin: 9.6 g/dL — ABNORMAL LOW (ref 12.0–15.0)
MCH: 17.7 pg — ABNORMAL LOW (ref 26.0–34.0)
MCHC: 29.4 g/dL — ABNORMAL LOW (ref 30.0–36.0)
MCV: 60.2 fL — ABNORMAL LOW (ref 80.0–100.0)
Platelets: 286 10*3/uL (ref 150–400)
RBC: 5.43 MIL/uL — ABNORMAL HIGH (ref 3.87–5.11)
RDW: 22.1 % — ABNORMAL HIGH (ref 11.5–15.5)
WBC: 15 10*3/uL — ABNORMAL HIGH (ref 4.0–10.5)
nRBC: 0.2 % (ref 0.0–0.2)

## 2022-10-13 LAB — TROPONIN I (HIGH SENSITIVITY)
Troponin I (High Sensitivity): 4 ng/L (ref <18)
Troponin I (High Sensitivity): 3 ng/L (ref <18)

## 2022-10-13 LAB — BASIC METABOLIC PANEL
Anion gap: 10 (ref 5–15)
BUN: 9 mg/dL (ref 6–20)
CO2: 26 mmol/L (ref 22–32)
Calcium: 9 mg/dL (ref 8.9–10.3)
Chloride: 99 mmol/L (ref 98–111)
Creatinine, Ser: 0.71 mg/dL (ref 0.44–1.00)
GFR, Estimated: >60 mL/min (ref >60)
Glucose, Bld: 89 mg/dL (ref 70–99)
Potassium: 4.3 mmol/L (ref 3.5–5.1)
Sodium: 135 mmol/L (ref 135–145)

## 2022-10-13 LAB — I-STAT BETA HCG BLOOD, ED (MC, WL, AP ONLY): I-stat hCG, quantitative: <5 m[IU]/mL (ref <5)

## 2022-10-13 MED ORDER — IBUPROFEN 600 MG PO TABS: 600.0000 mg | ORAL_TABLET | Freq: Four times a day (QID) | ORAL | 0 refills | Status: DC | PRN

## 2022-10-13 MED ORDER — KETOROLAC TROMETHAMINE 30 MG/ML IJ SOLN
30.0000 mg | Freq: Once | INTRAMUSCULAR | Status: AC
  Administered 2022-10-13: 30 mg via INTRAVENOUS
  Filled 2022-10-13: qty 1

## 2022-10-13 MED ORDER — IOHEXOL 350 MG/ML SOLN
80.0000 mL | Freq: Once | INTRAVENOUS | Status: AC | PRN
  Administered 2022-10-13: 80 mL via INTRAVENOUS

## 2022-10-13 MED ORDER — ALBUTEROL SULFATE HFA 108 (90 BASE) MCG/ACT IN AERS
2.0000 | INHALATION_SPRAY | Freq: Once | RESPIRATORY_TRACT | Status: AC
  Administered 2022-10-13: 2 via RESPIRATORY_TRACT
  Filled 2022-10-13: qty 6.7

## 2022-10-13 MED ORDER — ONDANSETRON HCL 4 MG/2ML IJ SOLN
4.0000 mg | Freq: Once | INTRAMUSCULAR | Status: AC
  Administered 2022-10-13: 4 mg via INTRAVENOUS
  Filled 2022-10-13: qty 2

## 2022-10-13 MED ORDER — DOXYCYCLINE HYCLATE 100 MG PO TABS
100.0000 mg | ORAL_TABLET | Freq: Once | ORAL | Status: AC
  Administered 2022-10-13: 100 mg via ORAL
  Filled 2022-10-13: qty 1

## 2022-10-13 MED ORDER — HYDROMORPHONE HCL 1 MG/ML IJ SOLN
1.0000 mg | Freq: Once | INTRAMUSCULAR | Status: AC
  Administered 2022-10-13: 1 mg via INTRAVENOUS
  Filled 2022-10-13: qty 1

## 2022-10-13 MED ORDER — DOXYCYCLINE HYCLATE 100 MG PO CAPS: 100.0000 mg | ORAL_CAPSULE | Freq: Two times a day (BID) | ORAL | 0 refills | Status: DC

## 2022-10-13 NOTE — ED Provider Notes (Signed)
[facility] Provider Note   CSN: [id number] Arrival date & time: 10/13/22  1347     History  Chief Complaint  Patient presents with   Chest Pain    Julie King is a 32 y.o. female.  Julie King is a 32 y.o. female with history of hypertension, IBS, morbid obesity and recent left-sided pulmonary wedge resection on 2/8 with Dr. Roxan Hockey, who presents to the ED for evaluation of worsening left-sided chest pain.  Patient reports that she has had pain since her surgery but that over the past 24 hours pain has gotten worse it is localized to the left side of her chest and back.  She reports she has been taking oral Dilaudid at home as prescribed but reports that over the past day it has no longer been helping with her pain.  She reports that pain is worse with deep breathing, movement and cough.  She does report that she has had an intermittent cough since surgery but feels like it has gotten a bit worse over the past day or so and has been productive of clear sputum, no hemoptysis.  She has not noticed any redness, drainage or swelling over her incision site.  No fevers that she knows of.  Reports that she called her surgeon's office today and was encouraged to come in for further evaluation.  The history is provided by the patient and medical records.  Chest Pain Associated symptoms: cough and shortness of breath   Associated symptoms: no abdominal pain, no fever, no nausea and no vomiting        Home Medications Prior to Admission medications   Medication Sig Start Date End Date Taking? Authorizing Provider  albuterol (VENTOLIN HFA) 108 (90 Base) MCG/ACT inhaler Inhale 2 puffs into the lungs every 6 (six) hours as needed for wheezing or shortness of breath (Cough). 06/14/22   Lynden Oxford Scales, PA-C  benzonatate (TESSALON) 200 MG capsule Take 1 capsule (200 mg total) by mouth 3 (three) times daily. 09/01/22   Minor, Grace Bushy,  NP  chlorpheniramine-HYDROcodone (TUSSIONEX) 10-8 MG/5ML Take 5 mLs by mouth every 12 (twelve) hours as needed for cough. 09/01/22   Minor, Grace Bushy, NP  diclofenac Sodium (VOLTAREN) 1 % GEL Apply 4 g topically 4 (four) times daily. 09/13/22   Deno Etienne, DO  fluticasone (FLONASE) 50 MCG/ACT nasal spray Place 1 spray into both nostrils daily. 06/14/22   Lynden Oxford Scales, PA-C  gabapentin (NEURONTIN) 300 MG capsule Take 1 capsule (300 mg total) by mouth 3 (three) times daily. 10/09/22   Gold, Wilder Glade, PA-C  HYDROmorphone (DILAUDID) 2 MG tablet Take 1 tablet (2 mg total) by mouth every 6 (six) hours as needed for up to 7 days for severe pain. 10/09/22 10/16/22  Gold, Wayne E, PA-C  ipratropium-albuterol (DUONEB) 0.5-2.5 (3) MG/3ML SOLN Take 3 mLs by nebulization 4 (four) times daily. Patient taking differently: Take 3 mLs by nebulization every 6 (six) hours as needed (shortness of breath). 09/01/22   Minor, Grace Bushy, NP  mometasone-formoterol (DULERA) 200-5 MCG/ACT AERO Inhale 2 puffs into the lungs 2 (two) times daily. 09/01/22   Minor, Grace Bushy, NP  nicotine (NICODERM CQ - DOSED IN MG/24 HOURS) 14 mg/24hr patch Place 1 patch (14 mg total) onto the skin daily as needed (smoking cessation). 10/09/22   John Giovanni, PA-C      Allergies    Percocet [oxycodone-acetaminophen] and Pineapple    Review of  Systems   Review of Systems  Constitutional:  Negative for chills and fever.  Respiratory:  Positive for cough and shortness of breath.   Cardiovascular:  Positive for chest pain.  Gastrointestinal:  Negative for abdominal pain, nausea and vomiting.  All other systems reviewed and are negative.   Physical Exam Updated Vital Signs BP (!) 143/85   Pulse (!) 112   Temp 99.1 F (37.3 C) (Oral)   Resp (!) 109   LMP 09/30/2022 (Exact Date)   SpO2 99%  Physical Exam Vitals and nursing note reviewed.  Constitutional:      General: She is in acute distress.     Appearance: Normal appearance. She  is well-developed. She is obese. She is not diaphoretic.     Comments: On arrival patient is alert, appears uncomfortable and in some distress, very anxious, but nontoxic.  HENT:     Head: Normocephalic and atraumatic.  Eyes:     General:        Right eye: No discharge.        Left eye: No discharge.     Pupils: Pupils are equal, round, and reactive to light.  Cardiovascular:     Rate and Rhythm: Regular rhythm. Tachycardia present.     Pulses: Normal pulses.          Radial pulses are 2+ on the right side and 2+ on the left side.     Heart sounds: Normal heart sounds.  Pulmonary:     Effort: Tachypnea present.     Breath sounds: No rales.     Comments: Patient is tachypneic satting well on room air, breath sounds present bilaterally with some decreased breath sounds in the left lower lung base.  Incision over the left lateral chest wall without erythema or drainage. Abdominal:     General: Bowel sounds are normal. There is no distension.     Palpations: Abdomen is soft. There is no mass.     Tenderness: There is no abdominal tenderness. There is no guarding.     Comments: Abdomen soft, nondistended, nontender to palpation in all quadrants without guarding or peritoneal signs  Musculoskeletal:        General: No deformity.     Cervical back: Neck supple.     Right lower leg: No tenderness. No edema.     Left lower leg: No tenderness. No edema.  Skin:    General: Skin is warm and dry.     Capillary Refill: Capillary refill takes less than 2 seconds.  Neurological:     Mental Status: She is alert and oriented to person, place, and time.     Coordination: Coordination normal.     Comments: Speech is clear, able to follow commands Moves extremities without ataxia, coordination intact  Psychiatric:        Mood and Affect: Mood is anxious.        Behavior: Behavior normal.     ED Results / Procedures / Treatments   Labs (all labs ordered are listed, but only abnormal results are  displayed) Labs Reviewed  CBC - Abnormal; Notable for the following components:      Result Value   WBC 15.0 (*)    RBC 5.43 (*)    Hemoglobin 9.6 (*)    HCT 32.7 (*)    MCV 60.2 (*)    MCH 17.7 (*)    MCHC 29.4 (*)    RDW 22.1 (*)    All other components within normal limits  BASIC METABOLIC PANEL  I-STAT BETA HCG BLOOD, ED ([facility], [facility], [facility] ONLY)  TROPONIN I (HIGH SENSITIVITY)  TROPONIN I (HIGH SENSITIVITY)    EKG EKG Interpretation  Date/Time:  Friday October 14 2022 01:54:30 EST Ventricular Rate:  121 PR Interval:  98 QRS Duration: 84 QT Interval:  316 QTC Calculation: 448 R Axis:   12 Text Interpretation: Sinus tachycardia with short PR Minimal voltage criteria for LVH, may be normal variant ( R in aVL ) Cannot rule out Anterior infarct , age undetermined ST & T wave abnormality, consider inferior ischemia Abnormal ECG When compared with ECG of 13-Oct-2022 13:56, PREVIOUS ECG IS PRESENT Confirmed by Gerlene Fee [phone]) on 10/14/2022 1:58:28 AM  Radiology CT Angio Chest PE W/Cm &/Or Wo Cm  Result Date: 10/13/2022 CLINICAL DATA:  Status post left VATS and wedge resection of lung nodule involving upper and lower lobes left lung. EXAM: CT ANGIOGRAPHY CHEST WITH CONTRAST TECHNIQUE: Multidetector CT imaging of the chest was performed using the standard protocol during bolus administration of intravenous contrast. Multiplanar CT image reconstructions and MIPs were obtained to evaluate the vascular anatomy. RADIATION DOSE REDUCTION: This exam was performed according to the departmental dose-optimization program which includes automated exposure control, adjustment of the mA and/or kV according to patient size and/or use of iterative reconstruction technique. CONTRAST:  90m OMNIPAQUE IOHEXOL 350 MG/ML SOLN COMPARISON:  Chest CTA 09/30/2022 FINDINGS: Cardiovascular: Heart size upper normal. No thoracic aortic aneurysm. No large central pulmonary embolus in the main pulmonary arteries or  lobar/segmental pulmonary arteries to either lung. Subsegmental pulmonary arteries in the lower lobes are not reliably evaluated due to motion artifact. Mediastinum/Nodes: No mediastinal lymphadenopathy. No hilar lymphadenopathy. The esophagus has normal imaging features. There is no axillary lymphadenopathy. Lungs/Pleura: Subsegmental atelectasis noted right lower lobe. There is patchy collapse/consolidation in the posterior lingula and anterior left lower lobe, in proximity to the staple line from wedge resection. Dependent atelectasis noted in the posterior left lower lobe. No right pleural effusion with small to moderate left pleural effusion evident including some fluid probably loculated in the anterior pleural space (image 191/7). Upper Abdomen: Visualized portion of the upper abdomen is unremarkable. Musculoskeletal: No worrisome lytic or sclerotic osseous abnormality. Review of the MIP images confirms the above findings. IMPRESSION: 1. No large central pulmonary embolus in the main pulmonary arteries or lobar/segmental pulmonary arteries to either lung. Subsegmental pulmonary arteries in the lower lobes are not reliably evaluated due to motion artifact. 2. Patchy collapse/consolidation in the posterior lingula and anterior left lower lobe, in proximity to the staple line from recent wedge resection. 3. Subsegmental atelectasis noted right lower lobe and dependent left lower lobe. 4. Small to moderate left pleural effusion including some fluid probably loculated in the anterior pleural space. Electronically Signed   By: EMisty StanleyM.D.   On: 10/13/2022 18:06   DG Chest Port 1 View  Result Date: 10/13/2022 CLINICAL DATA:  Chest pain, status post left wedge resection EXAM: PORTABLE CHEST 1 VIEW COMPARISON:  10/08/2022 FINDINGS: Interval removal of a previously seen right neck vascular catheter. The heart size and mediastinal contours are within normal limits. Heterogeneous airspace opacity of the left  lung base with elevation of the left hemidiaphragm. Bandlike scarring or atelectasis of the right lung base. The visualized skeletal structures are unremarkable. IMPRESSION: 1. Heterogeneous airspace opacity of the left lung base with elevation of the left hemidiaphragm, appearance in keeping with reported left wedge resection. 2. Bandlike scarring or atelectasis of the right  lung base. Electronically Signed   By: Delanna Ahmadi M.D.   On: 10/13/2022 14:21     Procedures Procedures    Medications Ordered in ED Medications  HYDROmorphone (DILAUDID) injection 1 mg (1 mg Intravenous Given 10/13/22 1536)  ondansetron (ZOFRAN) injection 4 mg (4 mg Intravenous Given 10/13/22 1535)  ketorolac (TORADOL) 30 MG/ML injection 30 mg (30 mg Intravenous Given 10/13/22 1643)  HYDROmorphone (DILAUDID) injection 1 mg (1 mg Intravenous Given 10/13/22 1643)  HYDROmorphone (DILAUDID) injection 1 mg (1 mg Intravenous Given 10/13/22 1816)  iohexol (OMNIPAQUE) 350 MG/ML injection 80 mL (80 mLs Intravenous Contrast Given 10/13/22 1739)  doxycycline (VIBRA-TABS) tablet 100 mg (100 mg Oral Given 10/13/22 1926)  albuterol (VENTOLIN HFA) 108 (90 Base) MCG/ACT inhaler 2 puff (2 puffs Inhalation Given 10/13/22 1926)    ED Course/ Medical Decision Making/ A&P                             Medical Decision Making Amount and/or Complexity of Data Reviewed Labs: ordered. Radiology: ordered.  Risk Prescription drug management.   32 y.o. female presents to the ED with complaints of left-sided chest pain, this involves an extensive number of treatment options, and is a complaint that carries with it a high risk of complications and morbidity.  The differential diagnosis includes pneumothorax, pulmonary embolism, pneumonia, bronchitis, pleural effusion, postoperative pain, postop infection, ACS  On arrival pt is nontoxic, vitals significant for tachycardia and mild tachypnea, satting well on room air and normotensive.    Additional history obtained from chart review. Previous records obtained and reviewed including recent records from pulmonary wedge resection  I ordered medication IV Dilaudid, Zofran, and Toradol for pain management   Lab Tests:  I Ordered, reviewed, and interpreted labs, which included: Leukocytosis of 15, hemoglobin stable, no electrolyte derangements, troponin normal, pregnancy negative  Imaging Studies ordered:  I ordered imaging studies which included chest x-ray and CTA PE study, I independently visualized and interpreted imaging which showed heterogeneous airspace opacity in the left lung base with elevation of the left hemidiaphragm, appearance is in keeping with recent surgery.  PE without evidence of any pulmonary embolism fortunately, does show patchy collapse/consolidation in the posterior lingula and anterior left lower lobe in proximity to the staple line from recent surgery.  Some atelectasis and a small to moderate pleural effusion noted.  ED Course:    I consulted Dr. Roxan Hockey, patient's CT surgeon, and discussed lab and imaging findings, he reports that the findings described on CT are to be expected postsurgery and he suspects patient's pain is all just postoperative pain.  Based on CT findings alone he would not recommend antibiotics, but if patient was having symptoms of bronchitis could consider.  Given patient's increased cough that has become somewhat productive and worsening pain will treat with doxycycline, patient provided inhaler as well to help with potential underlying bronchitis.  Will have her continue home prescribed pain medications as well as added scheduled NSAIDs to hopefully help control pain better.  Patient to follow-up closely with her CT surgeon and strict return precautions discussed.  Pain improved after treatment in the ED.  At this time there does not appear to be any evidence of an acute emergency medical condition requiring further emergent  evaluation and the patient appears stable for discharge with appropriate outpatient follow up. Diagnosis and return precautions discussed with patient who verbalizes understanding and is agreeable to discharge.  Portions of this note were generated with [employer]. Dictation errors may occur despite best attempts at proofreading.         Final Clinical Impression(s) / ED Diagnoses Final diagnoses:  Post-op pain  Bronchitis    Rx / DC Orders ED Discharge Orders          Ordered    ibuprofen (ADVIL) 600 MG tablet  Every 6 hours PRN        10/13/22 1917    doxycycline (VIBRAMYCIN) 100 MG capsule  2 times daily        10/13/22 1917              Janet Berlin 10/26/22 2112    Davonna Belling, MD 10/27/22 1455

## 2022-10-13 NOTE — ED Triage Notes (Signed)
Pt c/o left sided chest pain that radiates to left upper back started last night. Pt had surgery on left lung for mass a week ago. Per EMS, pt has diminished lung sounds on left lower lungs. EMS gave ASA 376m.

## 2022-10-13 NOTE — Discharge Instructions (Signed)
To help manage your cough continue taking prescribed Dilaudid and Tussionex per your surgeon.  Take ibuprofen 600 mg every 6 hours to help with pain and inflammation.  Given your increasing cough concern for underlying bronchitis.  Take doxycycline twice daily and you can use inhaler if this seems to help with your coughing.  Follow-up closely with Dr. Roxan Hockey if your pain is still not controlled.  If you develop fevers, shortness of breath or other new or worsening symptoms please return for reevaluation.

## 2022-10-14 ENCOUNTER — Encounter (HOSPITAL_COMMUNITY): Payer: Self-pay

## 2022-10-14 ENCOUNTER — Observation Stay (HOSPITAL_COMMUNITY): Payer: Medicaid Other

## 2022-10-14 ENCOUNTER — Emergency Department (HOSPITAL_COMMUNITY): Payer: Medicaid Other

## 2022-10-14 ENCOUNTER — Inpatient Hospital Stay (HOSPITAL_COMMUNITY)
Admission: EM | Admit: 2022-10-14 | Discharge: 2022-10-28 | DRG: 853 | Disposition: A | Discharge status: Home Health | Payer: Medicaid Other | Attending: Internal Medicine | Admitting: Internal Medicine

## 2022-10-14 ENCOUNTER — Other Ambulatory Visit: Payer: Self-pay

## 2022-10-14 DIAGNOSIS — K589 Irritable bowel syndrome without diarrhea: Secondary | ICD-10-CM | POA: Diagnosis present

## 2022-10-14 DIAGNOSIS — F419 Anxiety disorder, unspecified: Secondary | ICD-10-CM | POA: Diagnosis present

## 2022-10-14 DIAGNOSIS — Z91018 Allergy to other foods: Secondary | ICD-10-CM

## 2022-10-14 DIAGNOSIS — R4586 Emotional lability: Secondary | ICD-10-CM | POA: Diagnosis not present

## 2022-10-14 DIAGNOSIS — Y848 Other medical procedures as the cause of abnormal reaction of the patient, or of later complication, without mention of misadventure at the time of the procedure: Secondary | ICD-10-CM | POA: Diagnosis present

## 2022-10-14 DIAGNOSIS — Z885 Allergy status to narcotic agent status: Secondary | ICD-10-CM

## 2022-10-14 DIAGNOSIS — Z713 Dietary counseling and surveillance: Secondary | ICD-10-CM | POA: Diagnosis not present

## 2022-10-14 DIAGNOSIS — R0600 Dyspnea, unspecified: Secondary | ICD-10-CM | POA: Diagnosis not present

## 2022-10-14 DIAGNOSIS — Z902 Acquired absence of lung [part of]: Secondary | ICD-10-CM | POA: Diagnosis not present

## 2022-10-14 DIAGNOSIS — J948 Other specified pleural conditions: Secondary | ICD-10-CM | POA: Diagnosis present

## 2022-10-14 DIAGNOSIS — R072 Precordial pain: Secondary | ICD-10-CM | POA: Diagnosis present

## 2022-10-14 DIAGNOSIS — R0609 Other forms of dyspnea: Secondary | ICD-10-CM | POA: Diagnosis not present

## 2022-10-14 DIAGNOSIS — E876 Hypokalemia: Secondary | ICD-10-CM | POA: Diagnosis present

## 2022-10-14 DIAGNOSIS — D509 Iron deficiency anemia, unspecified: Secondary | ICD-10-CM | POA: Diagnosis present

## 2022-10-14 DIAGNOSIS — J189 Pneumonia, unspecified organism: Secondary | ICD-10-CM | POA: Clinically undetermined

## 2022-10-14 DIAGNOSIS — I480 Paroxysmal atrial fibrillation: Secondary | ICD-10-CM | POA: Diagnosis present

## 2022-10-14 DIAGNOSIS — Z720 Tobacco use: Secondary | ICD-10-CM | POA: Diagnosis present

## 2022-10-14 DIAGNOSIS — Z79899 Other long term (current) drug therapy: Secondary | ICD-10-CM

## 2022-10-14 DIAGNOSIS — D72829 Elevated white blood cell count, unspecified: Secondary | ICD-10-CM | POA: Diagnosis not present

## 2022-10-14 DIAGNOSIS — J9811 Atelectasis: Secondary | ICD-10-CM | POA: Diagnosis present

## 2022-10-14 DIAGNOSIS — Z6841 Body Mass Index (BMI) 40.0 and over, adult: Secondary | ICD-10-CM

## 2022-10-14 DIAGNOSIS — Z716 Tobacco abuse counseling: Secondary | ICD-10-CM

## 2022-10-14 DIAGNOSIS — Z9049 Acquired absence of other specified parts of digestive tract: Secondary | ICD-10-CM

## 2022-10-14 DIAGNOSIS — B954 Other streptococcus as the cause of diseases classified elsewhere: Secondary | ICD-10-CM | POA: Diagnosis present

## 2022-10-14 DIAGNOSIS — R079 Chest pain, unspecified: Secondary | ICD-10-CM | POA: Diagnosis not present

## 2022-10-14 DIAGNOSIS — I10 Essential (primary) hypertension: Secondary | ICD-10-CM | POA: Diagnosis present

## 2022-10-14 DIAGNOSIS — J188 Other pneumonia, unspecified organism: Secondary | ICD-10-CM | POA: Diagnosis not present

## 2022-10-14 DIAGNOSIS — F17210 Nicotine dependence, cigarettes, uncomplicated: Secondary | ICD-10-CM | POA: Diagnosis present

## 2022-10-14 DIAGNOSIS — R0602 Shortness of breath: Secondary | ICD-10-CM | POA: Diagnosis not present

## 2022-10-14 DIAGNOSIS — J90 Pleural effusion, not elsewhere classified: Secondary | ICD-10-CM | POA: Diagnosis present

## 2022-10-14 DIAGNOSIS — Z1152 Encounter for screening for COVID-19: Secondary | ICD-10-CM

## 2022-10-14 DIAGNOSIS — F17200 Nicotine dependence, unspecified, uncomplicated: Secondary | ICD-10-CM | POA: Diagnosis present

## 2022-10-14 DIAGNOSIS — Z7951 Long term (current) use of inhaled steroids: Secondary | ICD-10-CM

## 2022-10-14 DIAGNOSIS — A419 Sepsis, unspecified organism: Secondary | ICD-10-CM | POA: Diagnosis present

## 2022-10-14 DIAGNOSIS — G8918 Other acute postprocedural pain: Secondary | ICD-10-CM | POA: Diagnosis not present

## 2022-10-14 DIAGNOSIS — R918 Other nonspecific abnormal finding of lung field: Secondary | ICD-10-CM | POA: Diagnosis present

## 2022-10-14 DIAGNOSIS — Z8249 Family history of ischemic heart disease and other diseases of the circulatory system: Secondary | ICD-10-CM

## 2022-10-14 DIAGNOSIS — Z87891 Personal history of nicotine dependence: Secondary | ICD-10-CM | POA: Diagnosis not present

## 2022-10-14 DIAGNOSIS — J851 Abscess of lung with pneumonia: Secondary | ICD-10-CM | POA: Diagnosis present

## 2022-10-14 DIAGNOSIS — R6510 Systemic inflammatory response syndrome (SIRS) of non-infectious origin without acute organ dysfunction: Secondary | ICD-10-CM | POA: Diagnosis present

## 2022-10-14 DIAGNOSIS — J918 Pleural effusion in other conditions classified elsewhere: Secondary | ICD-10-CM | POA: Diagnosis not present

## 2022-10-14 DIAGNOSIS — J9589 Other postprocedural complications and disorders of respiratory system, not elsewhere classified: Secondary | ICD-10-CM | POA: Diagnosis present

## 2022-10-14 DIAGNOSIS — J181 Lobar pneumonia, unspecified organism: Secondary | ICD-10-CM | POA: Diagnosis not present

## 2022-10-14 DIAGNOSIS — D508 Other iron deficiency anemias: Secondary | ICD-10-CM | POA: Diagnosis not present

## 2022-10-14 DIAGNOSIS — D649 Anemia, unspecified: Secondary | ICD-10-CM | POA: Diagnosis not present

## 2022-10-14 HISTORY — DX: Tobacco use: Z72.0

## 2022-10-14 LAB — CBC WITH DIFFERENTIAL/PLATELET
Abs Immature Granulocytes: 0 10*3/uL (ref 0.00–0.07)
Basophils Absolute: 0.2 10*3/uL — ABNORMAL HIGH (ref 0.0–0.1)
Basophils Relative: 1 %
Blasts: 1 %
Eosinophils Absolute: 0 10*3/uL (ref 0.0–0.5)
Eosinophils Relative: 0 %
HCT: 34.5 % — ABNORMAL LOW (ref 36.0–46.0)
Hemoglobin: 10.4 g/dL — ABNORMAL LOW (ref 12.0–15.0)
Lymphocytes Relative: 14 %
Lymphs Abs: 2.2 10*3/uL (ref 0.7–4.0)
MCH: 18 pg — ABNORMAL LOW (ref 26.0–34.0)
MCHC: 30.1 g/dL (ref 30.0–36.0)
MCV: 59.6 fL — ABNORMAL LOW (ref 80.0–100.0)
Monocytes Absolute: 0.6 10*3/uL (ref 0.1–1.0)
Monocytes Relative: 4 %
Neutro Abs: 12.7 10*3/uL — ABNORMAL HIGH (ref 1.7–7.7)
Neutrophils Relative %: 80 %
Platelets: 299 10*3/uL (ref 150–400)
RBC: 5.79 MIL/uL — ABNORMAL HIGH (ref 3.87–5.11)
RDW: 22.1 % — ABNORMAL HIGH (ref 11.5–15.5)
WBC: 15.9 10*3/uL — ABNORMAL HIGH (ref 4.0–10.5)
nRBC: 0.2 % (ref 0.0–0.2)
nRBC: 1 /100 WBC — ABNORMAL HIGH

## 2022-10-14 LAB — TROPONIN I (HIGH SENSITIVITY)
Troponin I (High Sensitivity): 7 ng/L (ref <18)
Troponin I (High Sensitivity): 3 ng/L (ref <18)

## 2022-10-14 LAB — BASIC METABOLIC PANEL
Anion gap: 14 (ref 5–15)
BUN: 11 mg/dL (ref 6–20)
CO2: 21 mmol/L — ABNORMAL LOW (ref 22–32)
Calcium: 9.1 mg/dL (ref 8.9–10.3)
Chloride: 99 mmol/L (ref 98–111)
Creatinine, Ser: 0.77 mg/dL (ref 0.44–1.00)
GFR, Estimated: >60 mL/min (ref >60)
Glucose, Bld: 114 mg/dL — ABNORMAL HIGH (ref 70–99)
Potassium: 4.2 mmol/L (ref 3.5–5.1)
Sodium: 134 mmol/L — ABNORMAL LOW (ref 135–145)

## 2022-10-14 LAB — ACID FAST CULTURE WITH REFLEXED SENSITIVITIES (MYCOBACTERIA): Acid Fast Culture: NEGATIVE

## 2022-10-14 LAB — SEDIMENTATION RATE: Sed Rate: 48 mm/hr — ABNORMAL HIGH (ref 0–22)

## 2022-10-14 LAB — HEPATIC FUNCTION PANEL
ALT: 18 U/L (ref 0–44)
AST: 19 U/L (ref 15–41)
Albumin: 3.1 g/dL — ABNORMAL LOW (ref 3.5–5.0)
Alkaline Phosphatase: 77 U/L (ref 38–126)
Bilirubin, Direct: 0.2 mg/dL (ref 0.0–0.2)
Indirect Bilirubin: 0.4 mg/dL (ref 0.3–0.9)
Total Bilirubin: 0.6 mg/dL (ref 0.3–1.2)
Total Protein: 7.2 g/dL (ref 6.5–8.1)

## 2022-10-14 LAB — RESP PANEL BY RT-PCR (RSV, FLU A&B, COVID)  RVPGX2
Influenza A by PCR: NEGATIVE
Influenza B by PCR: NEGATIVE
Resp Syncytial Virus by PCR: NEGATIVE
SARS Coronavirus 2 by RT PCR: NEGATIVE

## 2022-10-14 LAB — BRAIN NATRIURETIC PEPTIDE: B Natriuretic Peptide: 52.8 pg/mL (ref 0.0–100.0)

## 2022-10-14 LAB — PROCALCITONIN: Procalcitonin: <0.1 ng/mL

## 2022-10-14 LAB — LACTIC ACID, PLASMA
Lactic Acid, Venous: 1.1 mmol/L (ref 0.5–1.9)
Lactic Acid, Venous: 0.9 mmol/L (ref 0.5–1.9)

## 2022-10-14 LAB — C-REACTIVE PROTEIN: CRP: 21.4 mg/dL — ABNORMAL HIGH (ref <1.0)

## 2022-10-14 LAB — LACTATE DEHYDROGENASE: LDH: 186 U/L (ref 98–192)

## 2022-10-14 MED ORDER — MOMETASONE FURO-FORMOTEROL FUM 200-5 MCG/ACT IN AERO: 2.0000 | INHALATION_SPRAY | Freq: Two times a day (BID) | RESPIRATORY_TRACT | Status: DC

## 2022-10-14 MED ORDER — SODIUM CHLORIDE 0.9 % IV BOLUS
1000.0000 mL | Freq: Once | INTRAVENOUS | Status: AC
  Administered 2022-10-14: 1000 mL via INTRAVENOUS

## 2022-10-14 MED ORDER — ONDANSETRON HCL 4 MG PO TABS: 4.0000 mg | ORAL_TABLET | Freq: Four times a day (QID) | ORAL | Status: DC | PRN

## 2022-10-14 MED ORDER — FUROSEMIDE 10 MG/ML IJ SOLN
20.0000 mg | Freq: Once | INTRAMUSCULAR | Status: AC
  Administered 2022-10-14: 20 mg via INTRAVENOUS
  Filled 2022-10-14: qty 2

## 2022-10-14 MED ORDER — HYDROMORPHONE HCL 2 MG PO TABS
2.0000 mg | ORAL_TABLET | ORAL | Status: DC | PRN
  Administered 2022-10-14 (×2): 2 mg via ORAL
  Filled 2022-10-14 (×2): qty 1

## 2022-10-14 MED ORDER — HYDROMORPHONE HCL 1 MG/ML IJ SOLN
1.0000 mg | Freq: Once | INTRAMUSCULAR | Status: AC
  Administered 2022-10-14: 1 mg via INTRAVENOUS
  Filled 2022-10-14: qty 1

## 2022-10-14 MED ORDER — HYDROMORPHONE HCL 2 MG PO TABS: 2.0000 mg | ORAL_TABLET | ORAL | Status: DC | PRN

## 2022-10-14 MED ORDER — ACETAMINOPHEN 650 MG RE SUPP: 650.0000 mg | Freq: Four times a day (QID) | RECTAL | Status: DC | PRN

## 2022-10-14 MED ORDER — HYDROMORPHONE HCL 1 MG/ML IJ SOLN
1.0000 mg | INTRAMUSCULAR | Status: DC | PRN
  Administered 2022-10-14 – 2022-10-17 (×17): 1 mg via INTRAVENOUS
  Filled 2022-10-14 (×18): qty 1

## 2022-10-14 MED ORDER — GUAIFENESIN ER 600 MG PO TB12
600.0000 mg | ORAL_TABLET | Freq: Two times a day (BID) | ORAL | Status: DC
  Administered 2022-10-14 – 2022-10-28 (×29): 600 mg via ORAL
  Filled 2022-10-14 (×29): qty 1

## 2022-10-14 MED ORDER — ONDANSETRON HCL 4 MG/2ML IJ SOLN: 4.0000 mg | Freq: Four times a day (QID) | INTRAMUSCULAR | Status: DC | PRN

## 2022-10-14 MED ORDER — SODIUM CHLORIDE 0.9 % IV SOLN
2.0000 g | Freq: Once | INTRAVENOUS | Status: AC
  Administered 2022-10-14: 2 g via INTRAVENOUS
  Filled 2022-10-14: qty 20

## 2022-10-14 MED ORDER — SODIUM CHLORIDE 0.9% FLUSH
3.0000 mL | Freq: Two times a day (BID) | INTRAVENOUS | Status: DC
  Administered 2022-10-14 – 2022-10-21 (×15): 3 mL via INTRAVENOUS

## 2022-10-14 MED ORDER — ENOXAPARIN SODIUM 40 MG/0.4ML IJ SOSY
40.0000 mg | PREFILLED_SYRINGE | INTRAMUSCULAR | Status: DC
  Administered 2022-10-14 – 2022-10-20 (×7): 40 mg via SUBCUTANEOUS
  Filled 2022-10-14 (×7): qty 0.4

## 2022-10-14 MED ORDER — NICOTINE 14 MG/24HR TD PT24: 14.0000 mg | MEDICATED_PATCH | Freq: Every day | TRANSDERMAL | Status: DC | PRN

## 2022-10-14 MED ORDER — ACETAMINOPHEN 325 MG PO TABS
650.0000 mg | ORAL_TABLET | Freq: Four times a day (QID) | ORAL | Status: DC | PRN
  Administered 2022-10-14 – 2022-10-19 (×8): 650 mg via ORAL
  Filled 2022-10-14 (×8): qty 2

## 2022-10-14 MED ORDER — SODIUM CHLORIDE 0.9 % IV SOLN
2.0000 g | INTRAVENOUS | Status: DC
  Administered 2022-10-15: 2 g via INTRAVENOUS
  Filled 2022-10-14: qty 20

## 2022-10-14 MED ORDER — IOHEXOL 350 MG/ML SOLN
75.0000 mL | Freq: Once | INTRAVENOUS | Status: AC | PRN
  Administered 2022-10-14: 75 mL via INTRAVENOUS

## 2022-10-14 MED ORDER — IPRATROPIUM-ALBUTEROL 0.5-2.5 (3) MG/3ML IN SOLN
3.0000 mL | Freq: Four times a day (QID) | RESPIRATORY_TRACT | Status: DC
  Administered 2022-10-14 – 2022-10-16 (×9): 3 mL via RESPIRATORY_TRACT
  Filled 2022-10-14 (×12): qty 3

## 2022-10-14 MED ORDER — LORAZEPAM 2 MG/ML IJ SOLN
INTRAMUSCULAR | Status: AC
  Administered 2022-10-14: 0.5 mg via INTRAVENOUS
  Filled 2022-10-14: qty 1

## 2022-10-14 MED ORDER — LIDOCAINE HCL 1 % IJ SOLN
INTRAMUSCULAR | Status: AC
  Filled 2022-10-14: qty 20

## 2022-10-14 MED ORDER — ALBUTEROL SULFATE (2.5 MG/3ML) 0.083% IN NEBU
2.5000 mg | INHALATION_SOLUTION | RESPIRATORY_TRACT | Status: DC | PRN
  Filled 2022-10-14: qty 3

## 2022-10-14 MED ORDER — LORAZEPAM 2 MG/ML IJ SOLN
0.5000 mg | INTRAMUSCULAR | Status: DC | PRN
  Administered 2022-10-14 – 2022-10-19 (×13): 0.5 mg via INTRAVENOUS
  Filled 2022-10-14 (×13): qty 1

## 2022-10-14 MED ORDER — SODIUM CHLORIDE 0.9 % IV SOLN
500.0000 mg | Freq: Once | INTRAVENOUS | Status: AC
  Administered 2022-10-14: 500 mg via INTRAVENOUS
  Filled 2022-10-14: qty 5

## 2022-10-14 MED ORDER — SODIUM CHLORIDE 0.9 % IV SOLN
500.0000 mg | INTRAVENOUS | Status: DC
  Administered 2022-10-15: 500 mg via INTRAVENOUS
  Filled 2022-10-14: qty 5

## 2022-10-14 MED ORDER — GABAPENTIN 300 MG PO CAPS
300.0000 mg | ORAL_CAPSULE | Freq: Two times a day (BID) | ORAL | Status: DC
  Administered 2022-10-14 – 2022-10-18 (×9): 300 mg via ORAL
  Filled 2022-10-14 (×9): qty 1

## 2022-10-14 NOTE — ED Triage Notes (Signed)
Pt arrived to ED by EMS complaining of back and chest wall pain. Pt had surgery on 2/8 to removed a lung mass. States that her pain was tolerable with meds up until yesterday, she was seen and discharged from the ED with imporved pain but it came back and hasn't gotten better after taking her 11pm does of dilaudid   Pt states increase in shortness of breath because taking deep breaths is more painful and is having some mild nausea

## 2022-10-14 NOTE — ED Notes (Signed)
Incentive spirometer provided to pt with instructions. Pt states understanding and states experience using spirometer.

## 2022-10-14 NOTE — ED Notes (Signed)
Pt back to room via stretcher. Pt noted upright in bed talking on telephone. Respirations even, nonlabored. Nadn. Pt A&Ox4 at this time.

## 2022-10-14 NOTE — ED Notes (Signed)
Pure wick in place 

## 2022-10-14 NOTE — ED Provider Notes (Signed)
[facility] Provider Note   CSN: [id number] Arrival date & time: 10/14/22  0145     History  Chief Complaint  Patient presents with   Chest Pain   Back Pain    Julie King is a 32 y.o. female.  HPI   Patient with medical history including A-fib, hypertension, left lung mass, status post robotic fast resection on 02/8 by Dr. Gorden Harms presents emerged part with worsening left-sided chest pain.  Patient states that she has been having continued pain since her surgery, states that yesterday the pain got a lot worse, she states that she went to the ER where they gave her pain medications, did lab work and imaging which was negative and she was sent home.  She states when she got home she went to bed but upon waking she had worsening pain, pain remains to the left side of her chest going to her left back, she states she feels slightly short of breath, she denies any cough congestion fevers chills general body aches.  I reviewed patient's chart was seen earlier in the day, lab work including CBC showing stable leukocytosis hemoglobin, BMP which was unremarkable, negative delta troponins, she underwent CTA of the chest showing patchy collapse/consolidation in the posterior lingula, atelectasis in the right lower lobe, as well as a small to moderate left pleural effusion.  Home Medications Prior to Admission medications   Medication Sig Start Date End Date Taking? Authorizing Provider  albuterol (VENTOLIN HFA) 108 (90 Base) MCG/ACT inhaler Inhale 2 puffs into the lungs every 6 (six) hours as needed for wheezing or shortness of breath (Cough). 06/14/22   Lynden Oxford Scales, PA-C  benzonatate (TESSALON) 200 MG capsule Take 1 capsule (200 mg total) by mouth 3 (three) times daily. 09/01/22   Minor, Grace Bushy, NP  chlorpheniramine-HYDROcodone (TUSSIONEX) 10-8 MG/5ML Take 5 mLs by mouth every 12 (twelve) hours as needed for cough. 09/01/22   Minor,  Grace Bushy, NP  diclofenac Sodium (VOLTAREN) 1 % GEL Apply 4 g topically 4 (four) times daily. 09/13/22   Deno Etienne, DO  doxycycline (VIBRAMYCIN) 100 MG capsule Take 1 capsule (100 mg total) by mouth 2 (two) times daily. One po bid x 7 days 10/13/22   Jacqlyn Larsen, PA-C  fluticasone [facility]) 50 MCG/ACT nasal spray Place 1 spray into both nostrils daily. 06/14/22   Lynden Oxford Scales, PA-C  gabapentin (NEURONTIN) 300 MG capsule Take 1 capsule (300 mg total) by mouth 3 (three) times daily. 10/09/22   Gold, Wilder Glade, PA-C  HYDROmorphone (DILAUDID) 2 MG tablet Take 1 tablet (2 mg total) by mouth every 6 (six) hours as needed for up to 7 days for severe pain. 10/09/22 10/16/22  John Giovanni, PA-C  ibuprofen (ADVIL) 600 MG tablet Take 1 tablet (600 mg total) by mouth every 6 (six) hours as needed. 10/13/22   Jacqlyn Larsen, PA-C  ipratropium-albuterol (DUONEB) 0.5-2.5 (3) MG/3ML SOLN Take 3 mLs by nebulization 4 (four) times daily. Patient taking differently: Take 3 mLs by nebulization every 6 (six) hours as needed (shortness of breath). 09/01/22   Minor, Grace Bushy, NP  mometasone-formoterol (DULERA) 200-5 MCG/ACT AERO Inhale 2 puffs into the lungs 2 (two) times daily. 09/01/22   Minor, Grace Bushy, NP  nicotine (NICODERM CQ - DOSED IN MG/24 HOURS) 14 mg/24hr patch Place 1 patch (14 mg total) onto the skin daily as needed (smoking cessation). 10/09/22   John Giovanni, PA-C  Allergies    Percocet [oxycodone-acetaminophen] and Pineapple    Review of Systems   Review of Systems  Constitutional:  Negative for chills and fever.  Respiratory:  Positive for shortness of breath.   Cardiovascular:  Positive for chest pain.  Gastrointestinal:  Negative for abdominal pain.  Neurological:  Negative for headaches.    Physical Exam Updated Vital Signs BP (!) 110/55   Pulse (!) 111   Temp 99.5 F (37.5 C) (Oral)   Resp (!) 24   Ht 5' 7"$  (1.702 m)   Wt (!) 145 kg   LMP 09/30/2022 (Exact Date)   SpO2 96%    BMI 50.07 kg/m  Physical Exam Vitals and nursing note reviewed.  Constitutional:      General: She is in acute distress.     Appearance: She is not ill-appearing.  HENT:     Head: Normocephalic and atraumatic.     Nose: No congestion.  Eyes:     Conjunctiva/sclera: Conjunctivae normal.  Cardiovascular:     Rate and Rhythm: Regular rhythm. Tachycardia present.     Pulses: Normal pulses.     Heart sounds: No murmur heard.    No friction rub. No gallop.  Pulmonary:     Effort: No respiratory distress.     Breath sounds: No wheezing, rhonchi or rales.     Comments: Patient was slightly tachypneic but speaking full sentences, she was satting between 93 and 96 while on room air, lung sounds reveal decreased lung sounds in the left lower lobe with slight crackles present, no rales or rhonchi noted. Abdominal:     Palpations: Abdomen is soft.     Tenderness: There is no abdominal tenderness. There is no right CVA tenderness or left CVA tenderness.  Musculoskeletal:     Right lower leg: No edema.     Left lower leg: No edema.  Skin:    General: Skin is warm and dry.  Neurological:     Mental Status: She is alert.  Psychiatric:        Mood and Affect: Mood normal.     ED Results / Procedures / Treatments   Labs (all labs ordered are listed, but only abnormal results are displayed) Labs Reviewed  BASIC METABOLIC PANEL - Abnormal; Notable for the following components:      Result Value   Sodium 134 (*)    CO2 21 (*)    Glucose, Bld 114 (*)    All other components within normal limits  CBC WITH DIFFERENTIAL/PLATELET - Abnormal; Notable for the following components:   WBC 15.9 (*)    RBC 5.79 (*)    Hemoglobin 10.4 (*)    HCT 34.5 (*)    MCV 59.6 (*)    MCH 18.0 (*)    RDW 22.1 (*)    Neutro Abs 12.7 (*)    Basophils Absolute 0.2 (*)    nRBC 1 (*)    All other components within normal limits  RESP PANEL BY RT-PCR (RSV, FLU A&B, COVID)  RVPGX2  BRAIN NATRIURETIC PEPTIDE   PATHOLOGIST SMEAR REVIEW  TROPONIN I (HIGH SENSITIVITY)  TROPONIN I (HIGH SENSITIVITY)    EKG None  Radiology CT Angio Chest PE W and/or Wo Contrast  Result Date: 10/14/2022 CLINICAL DATA:  Pulmonary embolism suspected, high probability EXAM: CT ANGIOGRAPHY CHEST WITH CONTRAST TECHNIQUE: Multidetector CT imaging of the chest was performed using the standard protocol during bolus administration of intravenous contrast. Multiplanar CT image reconstructions and MIPs were obtained to  evaluate the vascular anatomy. RADIATION DOSE REDUCTION: This exam was performed according to the departmental dose-optimization program which includes automated exposure control, adjustment of the mA and/or kV according to patient size and/or use of iterative reconstruction technique. CONTRAST:  65m OMNIPAQUE IOHEXOL 350 MG/ML SOLN COMPARISON:  Yesterday FINDINGS: Cardiovascular: Satisfactory pulmonary artery timing but limited detail of subsegmental vessels due to body habitus primarily. There is also complicating motion artifact. No visible pulmonary emboli. Normal heart size without pericardial effusion. Normal aorta. Mediastinum/Nodes: Negative for adenopathy or mass Lungs/Pleura: Worsening opacity at the lung bases, especially on the left with volume loss. Moderate left pleural effusion. No pneumothorax. Upper Abdomen: No acute finding. Musculoskeletal: No acute finding Review of the MIP images confirms the above findings. IMPRESSION: 1. No visible pulmonary embolism. Suboptimal peripheral vessel evaluation due to motion and body habitus. 2. Postoperative left chest with worsening atelectasis and moderate left pleural effusion. Electronically Signed   By: JJorje GuildM.D.   On: 10/14/2022 06:28   DG Chest Portable 1 View  Result Date: 10/14/2022 CLINICAL DATA:  Dyspnea EXAM: PORTABLE CHEST 1 VIEW COMPARISON:  10/13/2022 FINDINGS: Lung volumes are small. Left basilar consolidation and small left pleural effusion  again identified with slight interval increase in left pleural fluid. Right basilar discoid atelectasis again noted. No pneumothorax. No pleural effusion on the right. Cardiac size is at the upper limits of normal. IMPRESSION: 1. Slight interval increase in left pleural fluid. Stable left basilar consolidation. 2. Pulmonary hypoinflation. Electronically Signed   By: AFidela SalisburyM.D.   On: 10/14/2022 02:30   CT Angio Chest PE W/Cm &/Or Wo Cm  Result Date: 10/13/2022 CLINICAL DATA:  Status post left VATS and wedge resection of lung nodule involving upper and lower lobes left lung. EXAM: CT ANGIOGRAPHY CHEST WITH CONTRAST TECHNIQUE: Multidetector CT imaging of the chest was performed using the standard protocol during bolus administration of intravenous contrast. Multiplanar CT image reconstructions and MIPs were obtained to evaluate the vascular anatomy. RADIATION DOSE REDUCTION: This exam was performed according to the departmental dose-optimization program which includes automated exposure control, adjustment of the mA and/or kV according to patient size and/or use of iterative reconstruction technique. CONTRAST:  811mOMNIPAQUE IOHEXOL 350 MG/ML SOLN COMPARISON:  Chest CTA 09/30/2022 FINDINGS: Cardiovascular: Heart size upper normal. No thoracic aortic aneurysm. No large central pulmonary embolus in the main pulmonary arteries or lobar/segmental pulmonary arteries to either lung. Subsegmental pulmonary arteries in the lower lobes are not reliably evaluated due to motion artifact. Mediastinum/Nodes: No mediastinal lymphadenopathy. No hilar lymphadenopathy. The esophagus has normal imaging features. There is no axillary lymphadenopathy. Lungs/Pleura: Subsegmental atelectasis noted right lower lobe. There is patchy collapse/consolidation in the posterior lingula and anterior left lower lobe, in proximity to the staple line from wedge resection. Dependent atelectasis noted in the posterior left lower lobe. No  right pleural effusion with small to moderate left pleural effusion evident including some fluid probably loculated in the anterior pleural space (image 191/7). Upper Abdomen: Visualized portion of the upper abdomen is unremarkable. Musculoskeletal: No worrisome lytic or sclerotic osseous abnormality. Review of the MIP images confirms the above findings. IMPRESSION: 1. No large central pulmonary embolus in the main pulmonary arteries or lobar/segmental pulmonary arteries to either lung. Subsegmental pulmonary arteries in the lower lobes are not reliably evaluated due to motion artifact. 2. Patchy collapse/consolidation in the posterior lingula and anterior left lower lobe, in proximity to the staple line from recent wedge resection. 3. Subsegmental atelectasis noted right  lower lobe and dependent left lower lobe. 4. Small to moderate left pleural effusion including some fluid probably loculated in the anterior pleural space. Electronically Signed   By: Misty Stanley M.D.   On: 10/13/2022 18:06   DG Chest Port 1 View  Result Date: 10/13/2022 CLINICAL DATA:  Chest pain, status post left wedge resection EXAM: PORTABLE CHEST 1 VIEW COMPARISON:  10/08/2022 FINDINGS: Interval removal of a previously seen right neck vascular catheter. The heart size and mediastinal contours are within normal limits. Heterogeneous airspace opacity of the left lung base with elevation of the left hemidiaphragm. Bandlike scarring or atelectasis of the right lung base. The visualized skeletal structures are unremarkable. IMPRESSION: 1. Heterogeneous airspace opacity of the left lung base with elevation of the left hemidiaphragm, appearance in keeping with reported left wedge resection. 2. Bandlike scarring or atelectasis of the right lung base. Electronically Signed   By: Delanna Ahmadi M.D.   On: 10/13/2022 14:21    Procedures Procedures    Medications Ordered in ED Medications  azithromycin (ZITHROMAX) 500 mg in sodium chloride  0.9 % 250 mL IVPB (has no administration in time range)  HYDROmorphone (DILAUDID) injection 1 mg (1 mg Intravenous Given 10/14/22 0350)  sodium chloride 0.9 % bolus 1,000 mL (1,000 mLs Intravenous New Bag/Given 10/14/22 0526)  cefTRIAXone (ROCEPHIN) 2 g in sodium chloride 0.9 % 100 mL IVPB (0 g Intravenous Stopped 10/14/22 0622)  HYDROmorphone (DILAUDID) injection 1 mg (1 mg Intravenous Given 10/14/22 0528)  iohexol (OMNIPAQUE) 350 MG/ML injection 75 mL (75 mLs Intravenous Contrast Given 10/14/22 [id number])    ED Course/ Medical Decision Making/ A&P                             Medical Decision Making Amount and/or Complexity of Data Reviewed Labs: ordered. Radiology: ordered.  Risk Prescription drug management.   This patient presents to the ED for concern of left-sided chest pain, this involves an extensive number of treatment options, and is a complaint that carries with it a high risk of complications and morbidity.  The differential diagnosis includes PE, DVT, worsening pleural effusion, dissection, pneumonia    Additional history obtained:  Additional history obtained from N/A External records from outside source obtained and reviewed including recent ER note, cardiothoracic note   Co morbidities that complicate the patient evaluation  Status post lung resection  Social Determinants of Health:  N/A    Lab Tests:  I Ordered, and personally interpreted labs.  The pertinent results include: CBC showing stable leukocytosis of 15.9 as well as microcytic anemia hemoglobin 10.4, BMP shows sodium 134 CO2 of 21 glucose 114, BNP is 52, respiratory panel negative, negative delta troponin   Imaging Studies ordered:  I ordered imaging studies including chest x-ray, CT of chest I independently visualized and interpreted imaging which showed chest x-ray reveals slightly worsening left pleural effusion I agree with the radiologist interpretation   Cardiac Monitoring:  The patient  was maintained on a cardiac monitor.  I personally viewed and interpreted the cardiac monitored which showed an underlying rhythm of: EKG sinus tach without signs of ischemia   Medicines ordered and prescription drug management:  I ordered medication including fluids and Dilaudid I have reviewed the patients home medicines and have made adjustments as needed  Critical Interventions:  N/A   Reevaluation:  Presenting with left-sided chest pain, she appears to be in acute distress, will obtain basic lab work imaging  and reassess.  Lab work remained stable from prior ED visit, but her chest x-ray is showing worsening left pleural effusion, she is slightly more tachycardic tachypneic from prior visit, I am concerned that she may need thoracentesis, will send down for CT of chest for further evaluation this will also rule out possibility of PE, dissection, and/or possible pneumonia as she was slightly febrile on arrival.  Consultations Obtained:  N/A    Test Considered:  N/A    Rule out I have low suspicion for ACS as history is atypical,  EKG was  without signs of ischemia, patient had negative delta troponin.  Suspicion for PE, dissection is lower at this time as she had imaging performed 12 hours ago which was negative, but we will further assess with additional imaging.  Suspicion for sepsis is also low at this time as she is nontoxic-appearing, her CBC has remained at her baseline, she did present slightly febrile, will continue to assess with additional imaging, and start her on a antibiotics.    Dispostion and problem list  Due to shift change patient be handed off to [facility] Trans PAC  Follow-up on CTA of chest, would recommend if patient is having worsening pleural effusion and or there is evidence of pneumonia would recommend admission.  If unremarkable patient likely can be discharged home.            Final Clinical Impression(s) / ED Diagnoses Final diagnoses:   Precordial pain  Pleural effusion    Rx / DC Orders ED Discharge Orders     None         Marcello Fennel, PA-C 10/14/22 [id number]    Maudie Flakes, MD 10/14/22 [phone]

## 2022-10-14 NOTE — Consult Note (Signed)
NAME:  Julie King, MRN:  [id number], DOB:  1990/09/05, LOS: 0 ADMISSION DATE:  10/14/2022, CONSULTATION DATE:  10/14/22 REFERRING MD:  Tamala Julian CHIEF COMPLAINT:  Dyspnea   History of Present Illness:  Julie King is a 32 y.o. female who is known to our practice for lingular mass. She has had robotic biopsy at [facility] 07/05/22 which was negative for malignancy. She had repeat CT scans that showed increase in size and also developed hemoptysis; therefore, she was seen at [facility] in December 2023. She underwent bronch and biopsy again with similar results of no malignancy, just inflammatory cells. She was then referred to [facility] and ultimately had VATS with wedge resection of lingular lung mass on 2/8 (Dr. Roxan Hockey). Pathology was negative for malignancy and showed inflammation with focal necrosis, fibrosis, and pneumocyte hyperplasia. She initially did well but then around 2/14, began to experience worsening chest pain that radiated into her back along with subjective fevers, cough productive of gray-black sputum, nausea, orthopnea. She has had no drainage from her surgical site.  She presented to [facility] ED 2/15 with above symptoms. She had CTA that was negative for PE but showed post surgical changes and small to moderate L pleural effusion. EKG and troponins were negative. She was admitted by [facility] and was started on Ceftriaxone and Azithromycin.  She was seen by IR for possible thoracentesis but due to combination of increased WOB and small effusion, this was deferred.   Afternoon of 2/16, she had ongoing dyspnea despite O2 sat 98% on room air. PCCM called to see in consultation.  Pertinent  Medical History:  has Headache; Morbidly obese ([place]); History of atrial fibrillation; Essential hypertension; Family history of breast cancer; Hemoptysis; Lung mass; Mass of left lung; SIRS (systemic inflammatory response syndrome) ([place]); Healthcare-associated pneumonia; Pleural effusion; Microcytic hypochromic  anemia; Tobacco abuse; and Chest pain on their problem list.  Significant Hospital Events: Including procedures, antibiotic start and stop dates in addition to other pertinent events   2/16 admit, PCCM consult  Interim History / Subjective:    Objective:  Blood pressure 114/66, pulse (!) 118, temperature 99.7 F (37.6 C), temperature source Oral, resp. rate (!) 27, height 5' 7"$  (1.702 m), weight (!) 145 kg, last menstrual period 09/30/2022, SpO2 98 %.        Intake/Output Summary (Last 24 hours) at 10/14/2022 1644 Last data filed at 10/14/2022 1053 Gross per 24 hour  Intake 1350 ml  Output --  Net 1350 ml   Filed Weights   10/14/22 0152  Weight: (!) 145 kg    Examination: Blood pressure (!) 122/58, pulse (!) 124, temperature 99.7 F (37.6 C), temperature source Oral, resp. rate (!) 21, height 5' 7"$  (1.702 m), weight (!) 145 kg, last menstrual period 09/30/2022, SpO2 95 %. Gen:      No acute distress mild distress HEENT:  EOMI, sclera anicteric Neck:     No masses; no thyromegaly Lungs:   Bibasal crackles CV:         Regular rate and rhythm; no murmurs Abd:      + bowel sounds; soft, non-tender; no palpable masses, no distension Ext:    No edema; adequate peripheral perfusion Skin:      Warm and dry; no rash Neuro: alert and oriented x 3 Psych: normal mood and affect   Labs/imaging personally reviewed:  CTA chest 2/16 > no PE. Post operative changes in left chest with small to mod effusion.  Assessment & Plan:  Possible PNA  with small to mod effusion. - OK to continue empiric abx for now. -Attempted IR thoracentesis but had a small volume effusion and hence procedure was deferred  Dyspnea - subjectively dyspneic, saturating OK with SpO2 high 90s on room air. Likely combination atelectasis, anxiety, possible post op pain. Small effusion possibly contributing but doubt much  - Bronchial hygiene. - Mobilize. - Pain control.  Recent VATS with wedge resection of lingular  lung mass on 2/8 (Dr. Roxan Hockey) - pathology negative for malignancy. - Pain control. - Bronchial hygiene as above. - F/u as outpatient.  Per primary team.  Signature:    Marshell Garfinkel MD [facility] See Amion for pager  If no response to pager , please call [phone] until 7pm After 7:00 pm call Elink  [phone] 10/14/2022, 6:22 PM

## 2022-10-14 NOTE — ED Notes (Signed)
Pt transported to Ct.

## 2022-10-14 NOTE — ED Notes (Signed)
Pt and family member aware of IR eta for thoracentesis.

## 2022-10-14 NOTE — Progress Notes (Signed)
RT attempted to stick pt for ABG. After getting a flash of blood pt started to jerk her arm back and asked if I knew what I was doing and asked me to stop. MD informed that ABG was not able to be collected at this time.

## 2022-10-14 NOTE — H&P (Addendum)
History and Physical    Patient: Julie King DOB: 1991/07/28 DOA: 10/14/2022 DOS: the patient was seen and examined on 10/14/2022 PCP: Hattie Perch, MD  Patient coming from: Home via EMS  Chief Complaint:  Chief Complaint  Patient presents with   Chest Pain   Back Pain   HPI: Julie King is a 32 y.o. female with medical history significant of hyper engine, paroxysmal atrial fibrillation, tobacco abuse, and  morbid obesity who presents with complaints of chest pain.  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 Koleen Nimrod on 2/8.  Pathology from the lung mass was noted to show inflammation with focal necrosis, fibrosis, and pneumocyte hyperplasia.  No malignancy was seen and possible etiologies included infarction and resolving abscess. Over the last 2 days patient reported developing worsening left-sided chest pain.  Pain above her breast was noted to be sharp wall also having heaviness feeling underneath her left breast.  She reported pain radiated to her back.  Reports associated symptoms of subjective fever, productive cough with gray-black sputum production nausea, and orthopnea.  Denies any drainage or redness from prior surgical sites.  Patient had been seen in the emergency department yesterday and evaluated with CT angiogram of the chest which did not show any signs of pulmonary embolism, but did note concern for patchy collapse/consolidation in the posterior lingual and anterior left lobe with small to moderate left pleural effusion including some fluid possibly loculated in the anterior pleural space.  Patient has been sent in prescription for doxycycline 100 mg to take twice daily for 7 days, but had not picked up the medicine yet.  In route with EMS patient was given 324 mg of aspirin.  In the emergency department patient was noted to be febrile up to 100.6 F with tachycardia and tachypnea meeting SIRS criteria.  Labs  significant for WBC 15.9, hemoglobin 10.4, sodium 134, BNP 52.8, and high-sensitivity troponins negative x 2.  Influenza, COVID-19, and RSV screening were negative.  CT angiogram of the chest did not note any signs of a pulmonary embolism, but did note postoperative left chest with worsening atelectasis and moderate left pleural effusion.  Patient had been given 1 L normal saline IV fluids, Dilaudid IV, Rocephin, and azithromycin.   Review of Systems: As mentioned in the history of present illness. All other systems reviewed and are negative. Past Medical History:  Diagnosis Date   Atrial fibrillation ([place]) [id number]   Complication of anesthesia    wakes up emotional   Family history of adverse reaction to anesthesia    mother has n & v   Family history of breast cancer    Headache 07/25/2017   Hypertension    Irritable bowel syndrome    Morbidly obese ([place]) 07/25/2017   PONV (postoperative nausea and vomiting)    QT prolongation    T wave inversion in EKG    Past Surgical History:  Procedure Laterality Date   BRONCHIAL BIOPSY  08/30/2022   Procedure: BRONCHIAL BIOPSIES;  Surgeon: Garner Nash, DO;  Location: [facility];  Service: Pulmonary;;   BRONCHIAL BRUSHINGS  08/30/2022   Procedure: BRONCHIAL BRUSHINGS;  Surgeon: Garner Nash, DO;  Location: [facility];  Service: Pulmonary;;   BRONCHIAL NEEDLE ASPIRATION BIOPSY  08/30/2022   Procedure: BRONCHIAL NEEDLE ASPIRATION BIOPSIES;  Surgeon: Garner Nash, DO;  Location: [facility];  Service: Pulmonary;;   CESAREAN SECTION     CESAREAN SECTION N/A 01/31/2020  Procedure: CESAREAN SECTION;  Surgeon: Chancy Milroy, MD;  Location: [facility];  Service: Obstetrics;  Laterality: N/A;   CHOLECYSTECTOMY     HERNIA REPAIR     INTERCOSTAL NERVE BLOCK Left 10/06/2022   Procedure: INTERCOSTAL NERVE BLOCK;  Surgeon: Melrose Nakayama, MD;  Location: [facility];  Service: Thoracic;  Laterality: Left;   TONSILLECTOMY     VIDEO BRONCHOSCOPY WITH  ENDOBRONCHIAL ULTRASOUND N/A 10/06/2022   Procedure: VIDEO BRONCHOSCOPY WITH ENDOBRONCHIAL ULTRASOUND;  Surgeon: Melrose Nakayama, MD;  Location: [facility];  Service: Thoracic;  Laterality: N/A;   Social History:  reports that she has quit smoking. Her smoking use included cigarettes. She has a 5.00 pack-year smoking history. She has never used smokeless tobacco. She reports current alcohol use. She reports that she does not use drugs.  Allergies  Allergen Reactions   Percocet [Oxycodone-Acetaminophen] Hives, Itching and Nausea Only   Pineapple Itching    Fresh pineapples; pineapples from a can is okay    Family History  Problem Relation Age of Onset   Hypertension Other    Diabetes Other    Crohn's disease Mother    Hypertension Mother    Thyroid disease Father    Diabetes Father    Breast cancer Maternal Aunt 61   Breast cancer Paternal Grandmother        dx under 41   Breast cancer Other 44       MGMs sister, currently has stage IV    Prior to Admission medications   Medication Sig Start Date End Date Taking? Authorizing Provider  albuterol (VENTOLIN HFA) 108 (90 Base) MCG/ACT inhaler Inhale 2 puffs into the lungs every 6 (six) hours as needed for wheezing or shortness of breath (Cough). 06/14/22   Lynden Oxford Scales, PA-C  benzonatate (TESSALON) 200 MG capsule Take 1 capsule (200 mg total) by mouth 3 (three) times daily. 09/01/22   Minor, Grace Bushy, NP  chlorpheniramine-HYDROcodone (TUSSIONEX) 10-8 MG/5ML Take 5 mLs by mouth every 12 (twelve) hours as needed for cough. 09/01/22   Minor, Grace Bushy, NP  diclofenac Sodium (VOLTAREN) 1 % GEL Apply 4 g topically 4 (four) times daily. 09/13/22   Deno Etienne, DO  doxycycline (VIBRAMYCIN) 100 MG capsule Take 1 capsule (100 mg total) by mouth 2 (two) times daily. One po bid x 7 days 10/13/22   Jacqlyn Larsen, PA-C  fluticasone [facility]) 50 MCG/ACT nasal spray Place 1 spray into both nostrils daily. 06/14/22   Lynden Oxford Scales, PA-C   gabapentin (NEURONTIN) 300 MG capsule Take 1 capsule (300 mg total) by mouth 3 (three) times daily. 10/09/22   Gold, Wilder Glade, PA-C  HYDROmorphone (DILAUDID) 2 MG tablet Take 1 tablet (2 mg total) by mouth every 6 (six) hours as needed for up to 7 days for severe pain. 10/09/22 10/16/22  John Giovanni, PA-C  ibuprofen (ADVIL) 600 MG tablet Take 1 tablet (600 mg total) by mouth every 6 (six) hours as needed. 10/13/22   Jacqlyn Larsen, PA-C  ipratropium-albuterol (DUONEB) 0.5-2.5 (3) MG/3ML SOLN Take 3 mLs by nebulization 4 (four) times daily. Patient taking differently: Take 3 mLs by nebulization every 6 (six) hours as needed (shortness of breath). 09/01/22   Minor, Grace Bushy, NP  mometasone-formoterol (DULERA) 200-5 MCG/ACT AERO Inhale 2 puffs into the lungs 2 (two) times daily. 09/01/22   Minor, Grace Bushy, NP  nicotine (NICODERM CQ - DOSED IN MG/24 HOURS) 14 mg/24hr patch Place 1 patch (14 mg total) onto  the skin daily as needed (smoking cessation). 10/09/22   John Giovanni, PA-C    Physical Exam: Vitals:   10/14/22 0535 10/14/22 0541 10/14/22 0545 10/14/22 0700  BP:   (!) 110/55 118/65  Pulse:  (!) 112 (!) 111 (!) 116  Resp:  (!) 25 (!) 24 (!) 28  Temp: 99.5 F (37.5 C)     TempSrc: Oral     SpO2:  96% 96% 98%  Weight:      Height:       Exam  Constitutional: Morbid obese female who appears to be in distress sitting up in the hospital gurney Eyes: PERRL, lids and conjunctivae normal ENMT: Mucous membranes are moist.  Fair dentition Neck: normal, supple, no JVD Respiratory: Tachypneic with decreased overall aeration.  No significant wheezes appreciated. Cardiovascular: Tachycardic.  No extremity edema. 2+ pedal pulses.   Abdomen: no tenderness, no masses palpated. No hepatosplenomegaly. Bowel sounds positive.  Musculoskeletal: no clubbing / cyanosis. No joint deformity upper and lower extremities. Good ROM, no contractures. Normal muscle tone.  Skin: Post surgical wounds appear to be intact  with out erythema or drainage present Neurologic: CN 2-12 grossly intact. Strength 5/5 in all 4.  Psychiatric: Normal judgment and insight. Alert and oriented x 3. Normal mood.   Data Reviewed:  EKG reveals sinus tachycardia 121 bpm with LVH.  Reviewed labs, imaging, and pertinent records as noted above in HPI  Assessment and Plan:  Possible healthcare associated pneumonia   pleural effusion Patient presented with complaints of left-sided chest pain and shortness of breath and was noted to have temperature up to 100.6 F.  Patient reported to have O2 saturations temporarily as low as 70% by the nurse placed on 2 L of nasal cannula oxygen, but not formal documentation in chart.  CT angiogram of the chest had been obtained, but did not show any signs of a pulmonary embolism but noted worsening moderate pleural effusion with worsening opacities at the lung bases especially on the left lung base in the area of previous surgery.  Influenza, COVID-19, and RSV screening were negative.  Patient had initially been started on empiric antibiotics of Rocephin and azithromycin.   -Admit to a telemetry bed -Continuous pulse oximetry with nasal cannula oxygen to maintain O2 saturations greater than 92% -Incentive spirometry and flutter valve -Check procalcitonin and  MRSA screen -Continue empiric antibiotics of Rocephin and azithromycin -Mucinex -DuoNebs as needed for shortness of breath/wheezing -Recheck CBC tomorrow morning -IR consulted for thoracentesis with analysis of fluid Addendum:  Lasix 20 mg IV given after patient unable to tolerate thoracentesis  SIRS Patient was noted to be tachycardic, tachypneic, and have elevated white blood cell count meeting SIRS criteria.  Question possibility of a bacterial infection as a cause of her symptoms.  Orders to check blood cultures and lactic acid was done after patient had received antibiotics.  -Follow-up blood cultures and lactic acid -Continue empiric  antibiotics as noted above  Chest pain Patient reports worsening left-sided chest pain.  High-sensitivity troponins were negative x 2 and EKG noted nonspecific T wave changes on recheck.  Symptoms likely coming from either possibility of pneumonia , effusion with recent procedure,and/or recent procedure. -Continue to monitor  Microcytic hypochromic anemia Chronic.  Hemoglobin 10.4 which appears around patient's baseline. -Continue to monitor  History of lung mass Status postresection by Dr. Roxan Hockey.  Pathology noted no malignant cells.  Tobacco abuse Patient still reports smoking cigarettes. -Nicotine patch offered  Morbid obesity BMI 50.07 kg/m  Advance Care Planning:   Code Status: Full Code   Consults: IR  Severity of Illness: The appropriate patient status for this patient is OBSERVATION. Observation status is judged to be reasonable and necessary in order to provide the required intensity of service to ensure the patient's safety. The patient's presenting symptoms, physical exam findings, and initial radiographic and laboratory data in the context of their medical condition is felt to place them at decreased risk for further clinical deterioration. Furthermore, it is anticipated that the patient will be medically stable for discharge from the hospital within 2 midnights of admission.   Author: Norval Morton, MD 10/14/2022 8:01 AM  For on call review [email or web address].

## 2022-10-14 NOTE — Progress Notes (Signed)
Patient arrived from emergency room to 4E.  Patient painful in L chest and back.  Vital Signs Stable.  No other complaints, other than pain.    10/14/22 1620  Vitals  Temp 99.7 F (37.6 C)  Temp Source Oral  BP (!) 122/58  MAP (mmHg) 75  BP Location Right Arm  BP Method Automatic  Patient Position (if appropriate) Sitting  Pulse Rate (!) 124  Pulse Rate Source Monitor  ECG Heart Rate (!) 122  Resp (!) 21  Level of Consciousness  Level of Consciousness Alert  MEWS COLOR  MEWS Score Color Yellow  Oxygen Therapy  SpO2 95 %  O2 Device Nasal Cannula  O2 Flow Rate (L/min) 3 L/min  ECG Monitoring  PR interval 0.12  QRS interval 0.11  QT interval 0.25  QTc interval 0.35  CV Strip Heart Rate 121  Cardiac Rhythm ST  MEWS Score  MEWS Temp 0  MEWS Systolic 0  MEWS Pulse 2  MEWS RR 1  MEWS LOC 0  MEWS Score 3

## 2022-10-14 NOTE — ED Notes (Signed)
Pt going to IR for thoracentesis via stretcher. Pt A&Ox4 at this time.

## 2022-10-14 NOTE — ED Notes (Signed)
Patient placed on 2L [place] due to low oxygen saturation rates and rapid breathing.

## 2022-10-14 NOTE — ED Notes (Signed)
ED TO INPATIENT HANDOFF REPORT  ED Nurse Name and Phone #: Brittan Mapel,RN  S Name/Age/Gender Julie King 32 y.o. female Room/Bed: 037C/037C  Code Status   Code Status: Prior  Home/SNF/Other Home Patient oriented to: self, place, time, and situation Is this baseline? Yes   Triage Complete: Triage complete  Chief Complaint SIRS (systemic inflammatory response syndrome) (HCC) [R65.10]  Triage Note Pt arrived to ED by EMS complaining of back and chest wall pain. Pt had surgery on 2/8 to removed a lung mass. States that her pain was tolerable with meds up until yesterday, she was seen and discharged from the ED with imporved pain but it came back and hasn't gotten better after taking her 11pm does of dilaudid   Pt states increase in shortness of breath because taking deep breaths is more painful and is having some mild nausea    Allergies Allergies  Allergen Reactions   Percocet [Oxycodone-Acetaminophen] Hives, Itching and Nausea Only   Pineapple Itching    Fresh pineapples; pineapples from a can is okay    Level of Care/Admitting Diagnosis ED Disposition     ED Disposition  Admit   Condition  --   [place]: [place] [[id number]]  Level of Care: Telemetry Medical [104]  May place patient in observation at [facility] or [place] if equivalent level of care is available:: No  Covid Evaluation: Asymptomatic - no recent exposure (last 10 days) testing not required  Diagnosis: SIRS (systemic inflammatory response syndrome) [facility]) [id number]  Admitting Physician: Norval Morton [id number]  Attending Physician: Norval Morton [id number]          B Medical/Surgery History Past Medical History:  Diagnosis Date   Atrial fibrillation ([place]) [id number]   Complication of anesthesia    wakes up emotional   Family history of adverse reaction to anesthesia    mother has n & v   Family history of breast cancer    Headache 07/25/2017    Hypertension    Irritable bowel syndrome    Morbidly obese ([place]) 07/25/2017   PONV (postoperative nausea and vomiting)    QT prolongation    T wave inversion in EKG    Past Surgical History:  Procedure Laterality Date   BRONCHIAL BIOPSY  08/30/2022   Procedure: BRONCHIAL BIOPSIES;  Surgeon: Garner Nash, DO;  Location: [place] ENDOSCOPY;  Service: Pulmonary;;   BRONCHIAL BRUSHINGS  08/30/2022   Procedure: BRONCHIAL BRUSHINGS;  Surgeon: Garner Nash, DO;  Location: [place] [facility]COPY;  Service: Pulmonary;;   BRONCHIAL NEEDLE ASPIRATION BIOPSY  08/30/2022   Procedure: BRONCHIAL NEEDLE ASPIRATION BIOPSIES;  Surgeon: Garner Nash, DO;  Location: [place] [facility];  Service: Pulmonary;;   CESAREAN SECTION     CESAREAN SECTION N/A 01/31/2020   Procedure: CESAREAN SECTION;  Surgeon: Chancy Milroy, MD;  Location: [place] [facility] ORS;  Service: Obstetrics;  Laterality: N/A;   CHOLECYSTECTOMY     HERNIA REPAIR     INTERCOSTAL NERVE BLOCK Left 10/06/2022   Procedure: INTERCOSTAL NERVE BLOCK;  Surgeon: Melrose Nakayama, MD;  Location: [place];  Service: Thoracic;  Laterality: Left;   TONSILLECTOMY     VIDEO BRONCHOSCOPY WITH ENDOBRONCHIAL ULTRASOUND N/A 10/06/2022   Procedure: VIDEO BRONCHOSCOPY WITH ENDOBRONCHIAL ULTRASOUND;  Surgeon: Melrose Nakayama, MD;  Location[facility] OR;  Service: Thoracic;  Laterality: N/A;     A IV Location/Drains/Wounds Patient Lines/Drains/Airways Status     Active Line/Drains/Airways     Name Placement date Placement  time Site Days   Peripheral IV 10/14/22 20 G 2.5" Right;Upper Arm 10/14/22  0522  Arm  less than 1   Peripheral IV 10/14/22 20 G 1.88" Anterior;Left Forearm 10/14/22  0835  Forearm  less than 1            Intake/Output Last 24 hours  Intake/Output Summary (Last 24 hours) at 10/14/2022 0932 Last data filed at 10/14/2022 0900 Gross per 24 hour  Intake 350 ml  Output --  Net 350 ml    Labs/Imaging Results for orders placed or performed during the hospital  encounter of 10/14/22 (from the past 48 hour(s))  Basic metabolic panel     Status: Abnormal   Collection Time: 10/14/22  2:29 AM  Result Value Ref Range   Sodium 134 (L) 135 - 145 mmol/L   Potassium 4.2 3.5 - 5.1 mmol/L   Chloride 99 98 - 111 mmol/L   CO2 21 (L) 22 - 32 mmol/L   Glucose, Bld 114 (H) 70 - 99 mg/dL    Comment: Glucose reference range applies only to samples taken after fasting for at least 8 hours.   BUN 11 6 - 20 mg/dL   Creatinine, Ser 0.77 0.44 - 1.00 mg/dL   Calcium 9.1 8.9 - 10.3 mg/dL   GFR, Estimated >60 >60 mL/min    Comment: (NOTE) Calculated using the CKD-EPI Creatinine Equation (2021)    Anion gap 14 5 - 15    Comment: Performed at [facility] [address], [place], [place] [address]  CBC with Differential     Status: Abnormal   Collection Time: 10/14/22  2:29 AM  Result Value Ref Range   WBC 15.9 (H) 4.0 - 10.5 K/uL   RBC 5.79 (H) 3.87 - 5.11 MIL/uL   Hemoglobin 10.4 (L) 12.0 - 15.0 g/dL   HCT 34.5 (L) 36.0 - 46.0 %   MCV 59.6 (L) 80.0 - 100.0 fL   MCH 18.0 (L) 26.0 - 34.0 pg   MCHC 30.1 30.0 - 36.0 g/dL   RDW 22.1 (H) 11.5 - 15.5 %   Platelets 299 150 - 400 K/uL    Comment: REPEATED TO VERIFY   nRBC 0.2 0.0 - 0.2 %   Neutrophils Relative % 80 %   Neutro Abs 12.7 (H) 1.7 - 7.7 K/uL   Lymphocytes Relative 14 %   Lymphs Abs 2.2 0.7 - 4.0 K/uL   Monocytes Relative 4 %   Monocytes Absolute 0.6 0.1 - 1.0 K/uL   Eosinophils Relative 0 %   Eosinophils Absolute 0.0 0.0 - 0.5 K/uL   Basophils Relative 1 %   Basophils Absolute 0.2 (H) 0.0 - 0.1 K/uL   nRBC 1 (H) 0 /100 WBC   Blasts 1 %   Abs Immature Granulocytes 0.00 0.00 - 0.07 K/uL   Schistocytes HIDE    Tear Drop Cells PRESENT    Polychromasia PRESENT     Comment: Performed at [facility], [place] [address], [place], [place] [address]  Brain natriuretic peptide     Status: None   Collection Time: 10/14/22  2:29 AM  Result Value Ref Range   B Natriuretic Peptide 52.8 0.0 - 100.0  pg/mL    Comment: Performed at [facility] [address] [place], [place] [address]  Troponin I (High Sensitivity)     Status: None   Collection Time: 10/14/22  2:29 AM  Result Value Ref Range   Troponin I (High Sensitivity) 7 <18 ng/L  Comment: (NOTE) Elevated high sensitivity troponin I (hsTnI) values and significant  changes across serial measurements may suggest ACS but many other  chronic and acute conditions are known to elevate hsTnI results.  Refer to the "Links" section for chest pain algorithms and additional  guidance. Performed at [facility], [place] [address], [place], [place] [address]   Resp panel by RT-PCR (RSV, Flu A&B, Covid) Anterior Nasal Swab     Status: None   Collection Time: 10/14/22  3:32 AM   Specimen: Anterior Nasal Swab  Result Value Ref Range   SARS Coronavirus 2 by RT PCR NEGATIVE NEGATIVE   Influenza A by PCR NEGATIVE NEGATIVE   Influenza B by PCR NEGATIVE NEGATIVE    Comment: (NOTE) The Xpert Xpress SARS-CoV-2/FLU/RSV plus assay is intended as an aid in the diagnosis of influenza from Nasopharyngeal swab specimens and should not be used as a sole basis for treatment. Nasal washings and aspirates are unacceptable for Xpert Xpress SARS-CoV-2/FLU/RSV testing.  Fact Sheet for Patients: EntrepreneurPulse.com.au  Fact Sheet for Healthcare Providers: IncredibleEmployment.be  This test is not yet approved or cleared by the [place] FDA and has been authorized for detection and/or diagnosis of SARS-CoV-2 by FDA under an Emergency Use Authorization (EUA). This EUA will remain in effect (meaning this test can be used) for the duration of the COVID-19 declaration under Section 564(b)(1) of the Act, 21 U.S.C. section 360bbb-3(b)(1), unless the authorization is terminated or revoked.     Resp Syncytial Virus by PCR NEGATIVE NEGATIVE    Comment: (NOTE) Fact Sheet for  Patients: EntrepreneurPulse.com.au  Fact Sheet for Healthcare Providers: IncredibleEmployment.be  This test is not yet approved or cleared by the [place] FDA and has been authorized for detection and/or diagnosis of SARS-CoV-2 by FDA under an Emergency Use Authorization (EUA). This EUA will remain in effect (meaning this test can be used) for the duration of the COVID-19 declaration under Section 564(b)(1) of the Act, 21 U.S.C. section 360bbb-3(b)(1), unless the authorization is terminated or revoked.  Performed at [facility], [place] [address], [place], [place] [address]   Troponin I (High Sensitivity)     Status: None   Collection Time: 10/14/22  3:53 AM  Result Value Ref Range   Troponin I (High Sensitivity) 3 <18 ng/L    Comment: (NOTE) Elevated high sensitivity troponin I (hsTnI) values and significant  changes across serial measurements may suggest ACS but many other  chronic and acute conditions are known to elevate hsTnI results.  Refer to the "Links" section for chest pain algorithms and additional  guidance. Performed at [facility], [place] [address], [place], [place] [address]    CT Angio Chest PE W and/or Wo Contrast  Result Date: 10/14/2022 CLINICAL DATA:  Pulmonary embolism suspected, high probability EXAM: CT ANGIOGRAPHY CHEST WITH CONTRAST TECHNIQUE: Multidetector CT imaging of the chest was performed using the standard protocol during bolus administration of intravenous contrast. Multiplanar CT image reconstructions and MIPs were obtained to evaluate the vascular anatomy. RADIATION DOSE REDUCTION: This exam was performed according to the departmental dose-optimization program which includes automated exposure control, adjustment of the mA and/or kV according to patient size and/or use of iterative reconstruction technique. CONTRAST:  81m OMNIPAQUE IOHEXOL 350 MG/ML SOLN COMPARISON:  Yesterday FINDINGS:  Cardiovascular: Satisfactory pulmonary artery timing but limited detail of subsegmental vessels due to body habitus primarily. There is also complicating motion artifact. No visible pulmonary emboli. Normal heart size without pericardial effusion.  Normal aorta. Mediastinum/Nodes: Negative for adenopathy or mass Lungs/Pleura: Worsening opacity at the lung bases, especially on the left with volume loss. Moderate left pleural effusion. No pneumothorax. Upper Abdomen: No acute finding. Musculoskeletal: No acute finding Review of the MIP images confirms the above findings. IMPRESSION: 1. No visible pulmonary embolism. Suboptimal peripheral vessel evaluation due to motion and body habitus. 2. Postoperative left chest with worsening atelectasis and moderate left pleural effusion. Electronically Signed   By: Jorje Guild M.D.   On: 10/14/2022 06:28   DG Chest Portable 1 View  Result Date: 10/14/2022 CLINICAL DATA:  Dyspnea EXAM: PORTABLE CHEST 1 VIEW COMPARISON:  10/13/2022 FINDINGS: Lung volumes are small. Left basilar consolidation and small left pleural effusion again identified with slight interval increase in left pleural fluid. Right basilar discoid atelectasis again noted. No pneumothorax. No pleural effusion on the right. Cardiac size is at the upper limits of normal. IMPRESSION: 1. Slight interval increase in left pleural fluid. Stable left basilar consolidation. 2. Pulmonary hypoinflation. Electronically Signed   By: Fidela Salisbury M.D.   On: 10/14/2022 02:30   CT Angio Chest PE W/Cm &/Or Wo Cm  Result Date: 10/13/2022 CLINICAL DATA:  Status post left VATS and wedge resection of lung nodule involving upper and lower lobes left lung. EXAM: CT ANGIOGRAPHY CHEST WITH CONTRAST TECHNIQUE: Multidetector CT imaging of the chest was performed using the standard protocol during bolus administration of intravenous contrast. Multiplanar CT image reconstructions and MIPs were obtained to evaluate the vascular  anatomy. RADIATION DOSE REDUCTION: This exam was performed according to the departmental dose-optimization program which includes automated exposure control, adjustment of the mA and/or kV according to patient size and/or use of iterative reconstruction technique. CONTRAST:  22m OMNIPAQUE IOHEXOL 350 MG/ML SOLN COMPARISON:  Chest CTA 09/30/2022 FINDINGS: Cardiovascular: Heart size upper normal. No thoracic aortic aneurysm. No large central pulmonary embolus in the main pulmonary arteries or lobar/segmental pulmonary arteries to either lung. Subsegmental pulmonary arteries in the lower lobes are not reliably evaluated due to motion artifact. Mediastinum/Nodes: No mediastinal lymphadenopathy. No hilar lymphadenopathy. The esophagus has normal imaging features. There is no axillary lymphadenopathy. Lungs/Pleura: Subsegmental atelectasis noted right lower lobe. There is patchy collapse/consolidation in the posterior lingula and anterior left lower lobe, in proximity to the staple line from wedge resection. Dependent atelectasis noted in the posterior left lower lobe. No right pleural effusion with small to moderate left pleural effusion evident including some fluid probably loculated in the anterior pleural space (image 191/7). Upper Abdomen: Visualized portion of the upper abdomen is unremarkable. Musculoskeletal: No worrisome lytic or sclerotic osseous abnormality. Review of the MIP images confirms the above findings. IMPRESSION: 1. No large central pulmonary embolus in the main pulmonary arteries or lobar/segmental pulmonary arteries to either lung. Subsegmental pulmonary arteries in the lower lobes are not reliably evaluated due to motion artifact. 2. Patchy collapse/consolidation in the posterior lingula and anterior left lower lobe, in proximity to the staple line from recent wedge resection. 3. Subsegmental atelectasis noted right lower lobe and dependent left lower lobe. 4. Small to moderate left pleural  effusion including some fluid probably loculated in the anterior pleural space. Electronically Signed   By: EMisty StanleyM.D.   On: 10/13/2022 18:06   DG Chest Port 1 View  Result Date: 10/13/2022 CLINICAL DATA:  Chest pain, status post left wedge resection EXAM: PORTABLE CHEST 1 VIEW COMPARISON:  10/08/2022 FINDINGS: Interval removal of a previously seen right neck vascular catheter. The heart size and  mediastinal contours are within normal limits. Heterogeneous airspace opacity of the left lung base with elevation of the left hemidiaphragm. Bandlike scarring or atelectasis of the right lung base. The visualized skeletal structures are unremarkable. IMPRESSION: 1. Heterogeneous airspace opacity of the left lung base with elevation of the left hemidiaphragm, appearance in keeping with reported left wedge resection. 2. Bandlike scarring or atelectasis of the right lung base. Electronically Signed   By: Delanna Ahmadi M.D.   On: 10/13/2022 14:21    Pending Labs Unresulted Labs (From admission, onward)     Start     Ordered   10/14/22 0905  C-reactive protein  Once,   R        10/14/22 0905   10/14/22 0802  Hepatic function panel  Once,   R        10/14/22 0801   10/14/22 0801  Lactate dehydrogenase  Once,   R        10/14/22 0801   10/14/22 0723  Procalcitonin - Baseline  Once,   R        10/14/22 0722   10/14/22 0723  Sedimentation rate  Once,   R        10/14/22 0722   10/14/22 0712  Lactic acid, plasma  Now then every 2 hours,   R (with STAT occurrences)      10/14/22 0711   10/14/22 0711  Blood culture (routine x 2)  BLOOD CULTURE X 2,   R (with STAT occurrences)      10/14/22 0711   10/14/22 0229  Pathologist smear review  Once,   R        10/14/22 0229            Vitals/Pain Today's Vitals   10/14/22 0541 10/14/22 0545 10/14/22 0700 10/14/22 0725  BP:  (!) 110/55 118/65   Pulse: (!) 112 (!) 111 (!) 116   Resp: (!) 25 (!) 24 (!) 28   Temp:      TempSrc:      SpO2: 96% 96%  98%   Weight:      Height:      PainSc:    7     Isolation Precautions Airborne and Contact precautions  Medications Medications  HYDROmorphone (DILAUDID) tablet 2 mg (2 mg Oral Given 10/14/22 0908)  HYDROmorphone (DILAUDID) injection 1 mg (1 mg Intravenous Given 10/14/22 0350)  sodium chloride 0.9 % bolus 1,000 mL (1,000 mLs Intravenous New Bag/Given 10/14/22 0526)  cefTRIAXone (ROCEPHIN) 2 g in sodium chloride 0.9 % 100 mL IVPB (0 g Intravenous Stopped 10/14/22 0622)  HYDROmorphone (DILAUDID) injection 1 mg (1 mg Intravenous Given 10/14/22 0528)  iohexol (OMNIPAQUE) 350 MG/ML injection 75 mL (75 mLs Intravenous Contrast Given 10/14/22 0621)  azithromycin (ZITHROMAX) 500 mg in sodium chloride 0.9 % 250 mL IVPB (0 mg Intravenous Stopped 10/14/22 0900)  HYDROmorphone (DILAUDID) injection 1 mg (1 mg Intravenous Given 10/14/22 0803)    Mobility walks     Focused Assessments Cardiac Assessment Handoff:  Cardiac Rhythm: (S) Sinus tachycardia No results found for: "CKTOTAL", "CKMB", "CKMBINDEX", "TROPONINI" Lab Results  Component Value Date   DDIMER 1.08 (H) 04/12/2017   Does the Patient currently have chest pain? No   , Pulmonary Assessment Handoff:  Lung sounds: L Breath Sounds: Diminished R Breath Sounds: Clear O2 Device: Nasal Cannula O2 Flow Rate (L/min): 2 L/min    R Recommendations: See Admitting Provider Note  Report given to:   Additional Notes: pt will go for  thoracentesis prior to room assignment

## 2022-10-14 NOTE — Progress Notes (Signed)
Patient presented to IR for requested thoracentesis.  She was visibly tachypneic and grunting with each breath.  Imaging reveals some effusion, but level of respiratory distress was not consistent with the small volume effusion.  Decision was made to not proceed with thoracentesis at this time, but return patient for medical and respiratory management.  Should thoracentesis be deemed needed after stabilization, a new order may be placed and IR willing to readdress.  Electronically Signed: Pasty Spillers, PA-C 10/14/2022, 12:48 PM

## 2022-10-14 NOTE — ED Provider Notes (Signed)
Received signout at the beginning of shift, please see previous providers note for complete H&P.  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 Gorden Harms who presenting with complaints of worsening left-sided chest pain.  She was seen a day before for the same and was given symptomatic treatment and discharged home.  She is here today with worsening pain as well as having fever and worsening cough.  And pain was not adequately controlled despite taking her home Dilaudid pain medication.  Today's exam is notable for a fever of 100.6, tachycardia, tachypnea, and soft blood pressure.  Labs notable for leukocytosis with WBC 15.9.  Her COVID flu and RSV test came back negative.  She has negative delta troponin, low suspicion for ACS.  Normal BNP.  Patient was given Rocephin IV antibiotic, a chest CT angiogram was obtained which was independently viewed interpreted by me and agree with radiologist interpretation.  CT scan demonstrated no evidence of PE however patient does have worsening left atelectasis and moderate pleural effusion.  At this time, plan to consult medicine for admission patient may benefit from therapeutic thoracentesis.   7:12 AM Appreciate consultation from [facility] hospitalist, Dr. Tamala Julian who agrees to see and will admit patient for further care.  He request for blood cultures and lactic acid to be added on.  Patient does meet SIRS/sepsis criteria with fever, tachycardia, tachypnea, and leukocytosis.  Patient does not require fluid resuscitation at 30 mill per kilogram.  .Critical Care  Performed by: Domenic Moras, PA-C Authorized by: Domenic Moras, PA-C   Critical care provider statement:    Critical care time (minutes):  32   Critical care was time spent personally by me on the following activities:  Development of treatment plan with patient or surrogate, discussions with consultants, evaluation of  patient's response to treatment, examination of patient, ordering and review of laboratory studies, ordering and review of radiographic studies, ordering and performing treatments and interventions, pulse oximetry, re-evaluation of patient's condition and review of old charts  Results for orders placed or performed during the hospital encounter of 10/14/22  Resp panel by RT-PCR (RSV, Flu A&B, Covid) Anterior Nasal Swab   Specimen: Anterior Nasal Swab  Result Value Ref Range   SARS Coronavirus 2 by RT PCR NEGATIVE NEGATIVE   Influenza A by PCR NEGATIVE NEGATIVE   Influenza B by PCR NEGATIVE NEGATIVE   Resp Syncytial Virus by PCR NEGATIVE NEGATIVE  Basic metabolic panel  Result Value Ref Range   Sodium 134 (L) 135 - 145 mmol/L   Potassium 4.2 3.5 - 5.1 mmol/L   Chloride 99 98 - 111 mmol/L   CO2 21 (L) 22 - 32 mmol/L   Glucose, Bld 114 (H) 70 - 99 mg/dL   BUN 11 6 - 20 mg/dL   Creatinine, Ser 0.77 0.44 - 1.00 mg/dL   Calcium 9.1 8.9 - 10.3 mg/dL   GFR, Estimated >60 >60 mL/min   Anion gap 14 5 - 15  CBC with Differential  Result Value Ref Range   WBC 15.9 (H) 4.0 - 10.5 K/uL   RBC 5.79 (H) 3.87 - 5.11 MIL/uL   Hemoglobin 10.4 (L) 12.0 - 15.0 g/dL   HCT 34.5 (L) 36.0 - 46.0 %   MCV 59.6 (L) 80.0 - 100.0 fL   MCH 18.0 (L) 26.0 - 34.0 pg   MCHC 30.1 30.0 - 36.0 g/dL   RDW 22.1 (H) 11.5 - 15.5 %  Platelets 299 150 - 400 K/uL   nRBC 0.2 0.0 - 0.2 %   Neutrophils Relative % 80 %   Neutro Abs 12.7 (H) 1.7 - 7.7 K/uL   Lymphocytes Relative 14 %   Lymphs Abs 2.2 0.7 - 4.0 K/uL   Monocytes Relative 4 %   Monocytes Absolute 0.6 0.1 - 1.0 K/uL   Eosinophils Relative 0 %   Eosinophils Absolute 0.0 0.0 - 0.5 K/uL   Basophils Relative 1 %   Basophils Absolute 0.2 (H) 0.0 - 0.1 K/uL   nRBC 1 (H) 0 /100 WBC   Blasts 1 %   Abs Immature Granulocytes 0.00 0.00 - 0.07 K/uL   Schistocytes HIDE    Tear Drop Cells PRESENT    Polychromasia PRESENT   Brain natriuretic peptide  Result Value Ref  Range   B Natriuretic Peptide 52.8 0.0 - 100.0 pg/mL  Troponin I (High Sensitivity)  Result Value Ref Range   Troponin I (High Sensitivity) 7 <18 ng/L  Troponin I (High Sensitivity)  Result Value Ref Range   Troponin I (High Sensitivity) 3 <18 ng/L   CT Angio Chest PE W and/or Wo Contrast  Result Date: 10/14/2022 CLINICAL DATA:  Pulmonary embolism suspected, high probability EXAM: CT ANGIOGRAPHY CHEST WITH CONTRAST TECHNIQUE: Multidetector CT imaging of the chest was performed using the standard protocol during bolus administration of intravenous contrast. Multiplanar CT image reconstructions and MIPs were obtained to evaluate the vascular anatomy. RADIATION DOSE REDUCTION: This exam was performed according to the departmental dose-optimization program which includes automated exposure control, adjustment of the mA and/or kV according to patient size and/or use of iterative reconstruction technique. CONTRAST:  35m OMNIPAQUE IOHEXOL 350 MG/ML SOLN COMPARISON:  Yesterday FINDINGS: Cardiovascular: Satisfactory pulmonary artery timing but limited detail of subsegmental vessels due to body habitus primarily. There is also complicating motion artifact. No visible pulmonary emboli. Normal heart size without pericardial effusion. Normal aorta. Mediastinum/Nodes: Negative for adenopathy or mass Lungs/Pleura: Worsening opacity at the lung bases, especially on the left with volume loss. Moderate left pleural effusion. No pneumothorax. Upper Abdomen: No acute finding. Musculoskeletal: No acute finding Review of the MIP images confirms the above findings. IMPRESSION: 1. No visible pulmonary embolism. Suboptimal peripheral vessel evaluation due to motion and body habitus. 2. Postoperative left chest with worsening atelectasis and moderate left pleural effusion. Electronically Signed   By: JJorje GuildM.D.   On: 10/14/2022 06:28   DG Chest Portable 1 View  Result Date: 10/14/2022 CLINICAL DATA:  Dyspnea EXAM:  PORTABLE CHEST 1 VIEW COMPARISON:  10/13/2022 FINDINGS: Lung volumes are small. Left basilar consolidation and small left pleural effusion again identified with slight interval increase in left pleural fluid. Right basilar discoid atelectasis again noted. No pneumothorax. No pleural effusion on the right. Cardiac size is at the upper limits of normal. IMPRESSION: 1. Slight interval increase in left pleural fluid. Stable left basilar consolidation. 2. Pulmonary hypoinflation. Electronically Signed   By: AFidela SalisburyM.D.   On: 10/14/2022 02:30   CT Angio Chest PE W/Cm &/Or Wo Cm  Result Date: 10/13/2022 CLINICAL DATA:  Status post left VATS and wedge resection of lung nodule involving upper and lower lobes left lung. EXAM: CT ANGIOGRAPHY CHEST WITH CONTRAST TECHNIQUE: Multidetector CT imaging of the chest was performed using the standard protocol during bolus administration of intravenous contrast. Multiplanar CT image reconstructions and MIPs were obtained to evaluate the vascular anatomy. RADIATION DOSE REDUCTION: This exam was performed according to the departmental dose-optimization  program which includes automated exposure control, adjustment of the mA and/or kV according to patient size and/or use of iterative reconstruction technique. CONTRAST:  46m OMNIPAQUE IOHEXOL 350 MG/ML SOLN COMPARISON:  Chest CTA 09/30/2022 FINDINGS: Cardiovascular: Heart size upper normal. No thoracic aortic aneurysm. No large central pulmonary embolus in the main pulmonary arteries or lobar/segmental pulmonary arteries to either lung. Subsegmental pulmonary arteries in the lower lobes are not reliably evaluated due to motion artifact. Mediastinum/Nodes: No mediastinal lymphadenopathy. No hilar lymphadenopathy. The esophagus has normal imaging features. There is no axillary lymphadenopathy. Lungs/Pleura: Subsegmental atelectasis noted right lower lobe. There is patchy collapse/consolidation in the posterior lingula and anterior  left lower lobe, in proximity to the staple line from wedge resection. Dependent atelectasis noted in the posterior left lower lobe. No right pleural effusion with small to moderate left pleural effusion evident including some fluid probably loculated in the anterior pleural space (image 191/7). Upper Abdomen: Visualized portion of the upper abdomen is unremarkable. Musculoskeletal: No worrisome lytic or sclerotic osseous abnormality. Review of the MIP images confirms the above findings. IMPRESSION: 1. No large central pulmonary embolus in the main pulmonary arteries or lobar/segmental pulmonary arteries to either lung. Subsegmental pulmonary arteries in the lower lobes are not reliably evaluated due to motion artifact. 2. Patchy collapse/consolidation in the posterior lingula and anterior left lower lobe, in proximity to the staple line from recent wedge resection. 3. Subsegmental atelectasis noted right lower lobe and dependent left lower lobe. 4. Small to moderate left pleural effusion including some fluid probably loculated in the anterior pleural space. Electronically Signed   By: EMisty StanleyM.D.   On: 10/13/2022 18:06   DG Chest Port 1 View  Result Date: 10/13/2022 CLINICAL DATA:  Chest pain, status post left wedge resection EXAM: PORTABLE CHEST 1 VIEW COMPARISON:  10/08/2022 FINDINGS: Interval removal of a previously seen right neck vascular catheter. The heart size and mediastinal contours are within normal limits. Heterogeneous airspace opacity of the left lung base with elevation of the left hemidiaphragm. Bandlike scarring or atelectasis of the right lung base. The visualized skeletal structures are unremarkable. IMPRESSION: 1. Heterogeneous airspace opacity of the left lung base with elevation of the left hemidiaphragm, appearance in keeping with reported left wedge resection. 2. Bandlike scarring or atelectasis of the right lung base. Electronically Signed   By: ADelanna AhmadiM.D.   On:  10/13/2022 14:21   DG Chest 2 View  Result Date: 10/08/2022 CLINICAL DATA:  Status post wedge resection of lung nodule involving the LEFT UPPER and LOWER lobes. EXAM: CHEST - 2 VIEW COMPARISON:  10/07/2022 and prior studies FINDINGS: A RIGHT IJ central venous catheter with tip overlying the SVC again noted. The cardiomediastinal silhouette is grossly unchanged. LEFT surgical changes are again noted with continued bilateral mid-lower lung opacities/atelectasis. There is no evidence of pneumothorax. Little significant change is noted since the prior study. IMPRESSION: Unchanged appearance of the chest with continued bilateral mid-lower lung opacities/atelectasis. No evidence of pneumothorax. Electronically Signed   By: JMargarette CanadaM.D.   On: 10/08/2022 08:23   DG Chest 1V REPEAT Same Day  Result Date: 10/07/2022 CLINICAL DATA:  Encounter for chest tube removal. EXAM: CHEST - 1 VIEW SAME DAY COMPARISON:  10/07/2022. FINDINGS: 1157 hours. Interval removal of the left sided chest tube. No pneumothorax. Unchanged right IJ approach central venous catheter with tip projecting over the mid SVC. Similar linear retrocardiac right mid lung opacities, consistent with atelectasis. Persistent mild blunting of the left  costophrenic sulcus. Stable cardiac and mediastinal contours. IMPRESSION: Interval removal of the left-sided chest tube.  No pneumothorax. Electronically Signed   By: Emmit Alexanders M.D.   On: 10/07/2022 12:29   DG Chest Port 1 View  Result Date: 10/07/2022 CLINICAL DATA:  Mass of left lung and post resection. EXAM: PORTABLE CHEST 1 VIEW COMPARISON:  10/06/2022 FINDINGS: Right jugular central line with the tip in the upper SVC region. Heart size is stable. Patchy perihilar and basilar densities most likely represent atelectasis. Poorly visualized left chest tube appears to be stable. Question a tiny left apical pneumothorax. No evidence for a large pneumothorax. IMPRESSION: 1. Stable position of the left  chest tube. Difficult to exclude a tiny left apical pneumothorax. 2. Patchy densities in both lungs are most compatible with atelectasis. Electronically Signed   By: Markus Daft M.D.   On: 10/07/2022 07:39   DG Chest Port 1 View  Result Date: 10/06/2022 CLINICAL DATA:  Left lung mass. EXAM: PORTABLE CHEST 1 VIEW COMPARISON:  10/04/2022 FINDINGS: Interval placement of right IJ central venous catheter with tip over the SVC. Lungs are hypoinflated with bilateral perihilar opacification likely atelectasis and less likely infection. No significant effusion. No pneumothorax. Again noted is patient's lingular mass unchanged. Cardiomediastinal silhouette and remainder of the exam is unchanged. IMPRESSION: 1. Hypoinflation with bilateral perihilar opacification likely atelectasis and less likely infection. 2. Stable lingular mass. 3. Right IJ central venous catheter with tip over the SVC. Electronically Signed   By: Marin Olp M.D.   On: 10/06/2022 11:51   NM PET Image Initial (PI) Skull Base To Thigh  Result Date: 10/05/2022 CLINICAL DATA:  Initial treatment strategy for lung mass. Recent biopsy of LEFT lung mass. EXAM: NUCLEAR MEDICINE PET SKULL BASE TO THIGH TECHNIQUE: 14.9 mCi F-18 FDG was injected intravenously. Full-ring PET imaging was performed from the skull base to thigh after the radiotracer. CT data was obtained and used for attenuation correction and anatomic localization. Fasting blood glucose: 106 mg/dl COMPARISON:  CT chest 09/30/2022, CT chest 08/28/2022 and CT chest 06/28/2021 FINDINGS: Mediastinal blood pool activity: SUV max 2.37 Liver activity: SUV max NA NECK: No hypermetabolic lymph nodes in the neck. Incidental CT findings: None. CHEST: Within the lingula, 3.5 x 3.5 cm mass with central cavitation again noted and not changed from CT several days prior. Lesion at the site of previous smaller nodule on CT 08/28/2022 measuring 2.5 x 2.6 cm. This nodule was biopsied via bronchoscopy. The lingular  mass is intensely hypermetabolic with SUV max equal 7.3 (image 82). Single hypermetabolic LEFT mediastinal/hilar node measures 8 mm adjacent to the LEFT main pulmonary artery (image 69) with fairly intense metabolic activity (SUV max equal 6.6). Incidental CT findings: None. ABDOMEN/PELVIS: No abnormal hypermetabolic activity within the liver, pancreas, adrenal glands, or spleen. No hypermetabolic lymph nodes in the abdomen or pelvis. Hypermetabolic focus within the skin of the RIGHT inguinal region with SUV max equal 11.9 on image 192. There is subtle skin thickening associated with the metabolic activity. Incidental CT findings: None. SKELETON: No focal hypermetabolic activity to suggest skeletal metastasis. Incidental CT findings: None. IMPRESSION: 1. Intense metabolic activity associated with the biopsied lingular mass. Differential would include bronchogenic carcinoma versus pulmonary infection given the negative fine-needle aspiration and patient's young age. 2. Single hypermetabolic LEFT mediastinal/hilar lymph node is indeterminate. 3. Hypermetabolic cutaneous skin thickening in the RIGHT groin region is favored a benign cutaneous infection or inflammation. Electronically Signed   By: Helane Gunther.D.  On: 10/05/2022 16:01   DG Chest 2 View  Result Date: 10/04/2022 CLINICAL DATA:  Cavitary lingula mass, preoperative evaluation EXAM: CHEST - 2 VIEW COMPARISON:  09/30/2022 FINDINGS: Stable lingula known cavitary mass along the left cardiac border. Right lung remains clear. No new airspace disease, collapse or consolidation. No edema, effusion or pneumothorax. Trachea midline. Normal heart size and vascularity. No acute osseous finding. IMPRESSION: 1. Stable lingula cavitary mass. 2. No interval change or acute process by plain radiography. Electronically Signed   By: Jerilynn Mages.  Shick M.D.   On: 10/04/2022 15:06   CT Angio Chest PE W/Cm &/Or Wo Cm  Result Date: 09/30/2022 CLINICAL DATA:  Left spasm in  chest. History of left lung mass. Pulmonary embolism suspected. EXAM: CT ANGIOGRAPHY CHEST WITH CONTRAST TECHNIQUE: Multidetector CT imaging of the chest was performed using the standard protocol during bolus administration of intravenous contrast. Multiplanar CT image reconstructions and MIPs were obtained to evaluate the vascular anatomy. The initial contrast bolus timing was suboptimal, greater attenuation within the aorta than main pulmonary artery. Therefore, the scan was repeated RADIATION DOSE REDUCTION: This exam was performed according to the departmental dose-optimization program which includes automated exposure control, adjustment of the mA and/or kV according to patient size and/or use of iterative reconstruction technique. CONTRAST:  160m OMNIPAQUE IOHEXOL 350 MG/ML SOLN, 1067mOMNIPAQUE IOHEXOL 350 MG/ML SOLN COMPARISON:  Chest two views 09/30/2022, 09/13/2022, AP chest 08/30/2022, chest two views 06/14/2022; CTA chest 08/28/2022 and 06/28/2021 FINDINGS: Cardiovascular: On the repeat series with the better opacification of the main pulmonary artery, the main pulmonary artery is opacified up to 151 Hounsfield units. This remains suboptimal for evaluation for pulmonary embolism. Within this limitation, no large central pulmonary embolism is seen within the main, left main, or right main pulmonary arteries. Portions of the lobar and more distal pulmonary arteries are not opacified due to contrast bolus timing, and this study is not diagnostic beyond the flat right main pulmonary artery levels. Heart size is at the upper limits of normal. No pericardial effusion. No thoracic aortic aneurysm. Mediastinum/Nodes: No axillary, mediastinal, or hilar pathologically enlarged lymph nodes by CT criteria. The visualized thyroid is unremarkable. The esophagus follows a normal course and normal caliber. Lungs/Pleura: The central airways are patent. On the prior 08/28/2022 CT there was a spiculated mass within the  border of the lingula and anterior segment of left hepatic lobe, measuring up to approximately 3.1 x 3.1 x 2.3 cm (transverse by AP by craniocaudal). This was biopsied on 08/30/2022 reportedly negative. There is now a moderate central cavitary portion with likely layering fluid level. There is increased soft tissue density extending from the lung mass towards the anterior intercostal space between the fifth and sixth ribs, likely from the prior biopsy approach. This now measures up to approximately 3.3 x 4.2 x 2.6 cm (transverse AP craniocaudal, noting the greater AP dimension appears to be secondary to post biopsy changes anteriorly. There are scattered bilateral ground-glass opacities, likely subsegmental atelectasis. Upper Abdomen: Cholecystectomy clips. Musculoskeletal: No acute skeletal abnormality. Review of the MIP images confirms the above findings. IMPRESSION: 1. Despite the repeat of this study, there is evaluation for pulmonary embolism secondary to contrast bolus timing. No large central pulmonary embolism is seen, however evaluation of the lobar and more distal vessels is limited. 2. Interval biopsy of left lung mass compared to the prior 08/28/2022 CT. Reportedly this biopsy was benign. Interval development of a moderate central cavitary portion within this mass, possibly from the prior  biopsy. Additionally, there is increased soft tissue extending from the lung mass there towards the anterior intercostal space between the fifth and sixth ribs, likely from the prior biopsy approach. Electronically Signed   By: Yvonne Kendall M.D.   On: 09/30/2022 16:30   DG Chest 2 View  Result Date: 09/30/2022 CLINICAL DATA:  SOB EXAM: CHEST - 2 VIEW COMPARISON:  Chest x-ray 09/13/2022. FINDINGS: Interval decrease in size of a lingular mass, which now demonstrates gas suggestive of cavitation/necrosis. Otherwise, lungs are clear. Cardiomediastinal silhouette is within normal limits. IMPRESSION: 1. Interval decrease  in size of a lingular mass, which now demonstrates gas suggestive of cavitation/necrosis. CT chest with contrast could further evaluate if clinically warranted. 2. Otherwise, no acute cardiopulmonary disease. Electronically Signed   By: Margaretha Sheffield M.D.   On: 09/30/2022 14:34      Domenic Moras, PA-C 10/14/22 [id number]    Maudie Flakes, MD 10/15/22 [id number]

## 2022-10-15 ENCOUNTER — Inpatient Hospital Stay (HOSPITAL_COMMUNITY): Payer: Medicaid Other

## 2022-10-15 DIAGNOSIS — R6510 Systemic inflammatory response syndrome (SIRS) of non-infectious origin without acute organ dysfunction: Secondary | ICD-10-CM | POA: Diagnosis not present

## 2022-10-15 DIAGNOSIS — J189 Pneumonia, unspecified organism: Secondary | ICD-10-CM | POA: Diagnosis present

## 2022-10-15 DIAGNOSIS — R0602 Shortness of breath: Secondary | ICD-10-CM

## 2022-10-15 LAB — CBC
HCT: 31.3 % — ABNORMAL LOW (ref 36.0–46.0)
Hemoglobin: 9.5 g/dL — ABNORMAL LOW (ref 12.0–15.0)
MCH: 18 pg — ABNORMAL LOW (ref 26.0–34.0)
MCHC: 30.4 g/dL (ref 30.0–36.0)
MCV: 59.3 fL — ABNORMAL LOW (ref 80.0–100.0)
Platelets: 274 10*3/uL (ref 150–400)
RBC: 5.28 MIL/uL — ABNORMAL HIGH (ref 3.87–5.11)
RDW: 21.3 % — ABNORMAL HIGH (ref 11.5–15.5)
WBC: 22.5 10*3/uL — ABNORMAL HIGH (ref 4.0–10.5)
nRBC: 0.3 % — ABNORMAL HIGH (ref 0.0–0.2)

## 2022-10-15 LAB — BASIC METABOLIC PANEL
Anion gap: 10 (ref 5–15)
BUN: 9 mg/dL (ref 6–20)
CO2: 25 mmol/L (ref 22–32)
Calcium: 8.8 mg/dL — ABNORMAL LOW (ref 8.9–10.3)
Chloride: 99 mmol/L (ref 98–111)
Creatinine, Ser: 0.81 mg/dL (ref 0.44–1.00)
GFR, Estimated: >60 mL/min (ref >60)
Glucose, Bld: 144 mg/dL — ABNORMAL HIGH (ref 70–99)
Potassium: 4.2 mmol/L (ref 3.5–5.1)
Sodium: 134 mmol/L — ABNORMAL LOW (ref 135–145)

## 2022-10-15 LAB — PROCALCITONIN: Procalcitonin: 0.15 ng/mL

## 2022-10-15 LAB — MRSA NEXT GEN BY PCR, NASAL: MRSA by PCR Next Gen: NOT DETECTED

## 2022-10-15 MED ORDER — VANCOMYCIN HCL 2000 MG/400ML IV SOLN
2000.0000 mg | Freq: Once | INTRAVENOUS | Status: AC
  Administered 2022-10-15: 2000 mg via INTRAVENOUS
  Filled 2022-10-15: qty 400

## 2022-10-15 MED ORDER — OXYCODONE HCL 5 MG PO TABS
5.0000 mg | ORAL_TABLET | ORAL | Status: DC | PRN
  Administered 2022-10-15 – 2022-10-17 (×10): 5 mg via ORAL
  Filled 2022-10-15 (×11): qty 1

## 2022-10-15 MED ORDER — VANCOMYCIN HCL 1500 MG/300ML IV SOLN
1500.0000 mg | Freq: Two times a day (BID) | INTRAVENOUS | Status: DC
  Administered 2022-10-16 – 2022-10-18 (×5): 1500 mg via INTRAVENOUS
  Filled 2022-10-15 (×5): qty 300

## 2022-10-15 MED ORDER — AZITHROMYCIN 500 MG PO TABS
500.0000 mg | ORAL_TABLET | Freq: Every day | ORAL | Status: AC
  Administered 2022-10-16: 500 mg via ORAL
  Filled 2022-10-15: qty 1

## 2022-10-15 MED ORDER — SODIUM CHLORIDE 0.9 % IV SOLN
2.0000 g | Freq: Three times a day (TID) | INTRAVENOUS | Status: DC
  Administered 2022-10-15 – 2022-10-27 (×35): 2 g via INTRAVENOUS
  Filled 2022-10-15 (×35): qty 12.5

## 2022-10-15 NOTE — Evaluation (Signed)
Physical Therapy Evaluation Patient Details Name: Julie King MRN: [id number] DOB: 16-Apr-1991 Today's Date: 10/15/2022  History of Present Illness  32 year old with recently diagnosed lingular mass, robotic biopsy 06/2022 negative for malignancy, repeat CT scans with increasing size and hemoptysis.  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   Admitted due to L side chest pain, fever, cough and orthopnea found to have L pleural effusion.  Clinical Impression  Patient presents with decreased mobility due to deficits listed in PT problem list.  Patient currently ambulating limited distances with rollator and assist for O2 management and self monitoring.  She lives with family in apartment with level entry.  She should be able to progress home with continued skilled PT and mobility encouragement during acute stay.  May need HHPT and possibly HHaide initially upon d/c if possible.  Will continue to follow.        Recommendations for follow up therapy are one component of a multi-disciplinary discharge planning process, led by the attending physician.  Recommendations may be updated based on patient status, additional functional criteria and insurance authorization.  Follow Up Recommendations Home health PT      Assistance Recommended at Discharge Intermittent Supervision/Assistance  Patient can return home with the following  A little help with walking and/or transfers;Assistance with cooking/housework;Assist for transportation;Help with stairs or ramp for entrance;A little help with bathing/dressing/bathroom    Equipment Recommendations None recommended by PT  Recommendations for Other Services       Functional Status Assessment Patient has had a recent decline in their functional status and demonstrates the ability to make significant improvements in function in a reasonable and predictable amount of time.     Precautions / Restrictions  Precautions Precautions: Fall Precaution Comments: watch HR, SpO2      Mobility  Bed Mobility               General bed mobility comments: up on EOB upon PT entry    Transfers Overall transfer level: Needs assistance Equipment used: Rollator (4 wheels) Transfers: Sit to/from Stand Sit to Stand: Min assist           General transfer comment: increased time, help for lines and balance    Ambulation/Gait Ambulation/Gait assistance: Min guard, Supervision Gait Distance (Feet): 20 Feet (& 80') Assistive device: Rollator (4 wheels) Gait Pattern/deviations: Step-through pattern, Decreased stride length, Wide base of support, Drifts right/left       General Gait Details: using wide rollator which she reports moves too easily compared to hers, noted walker moving side to side as she takes steps and with some LE weakness, stopped to sit just outside the door to rest, then walked to nursing station then back to her chair  Stairs            Wheelchair Mobility    Modified Rankin (Stroke Patients Only)       Balance Overall balance assessment: Needs assistance Sitting-balance support: Feet supported Sitting balance-Leahy Scale: Good       Standing balance-Leahy Scale: Fair Standing balance comment: static standing without UE support, needs UE's on walker for ambulation                             Pertinent Vitals/Pain Pain Assessment Pain Assessment: 0-10 Pain Score: 9  Pain Location: L chest Pain Descriptors / Indicators: Discomfort Pain Intervention(s): Monitored during session, Premedicated before session    Home  Living Family/patient expects to be discharged to:: Private residence Living Arrangements: Other relatives Available Help at Discharge: Family;Available PRN/intermittently Type of Home: Apartment Home Access: Level entry       Home Layout: One level Home Equipment: BSC/3in1;Rollator (4 wheels)      Prior Function Prior  Level of Function : Independent/Modified Independent               ADLs Comments: been doing it since surgery but struggling     Hand Dominance   Dominant Hand: Right    Extremity/Trunk Assessment   Upper Extremity Assessment Upper Extremity Assessment: Generalized weakness    Lower Extremity Assessment Lower Extremity Assessment: Generalized weakness       Communication   Communication: No difficulties  Cognition Arousal/Alertness: Awake/alert Behavior During Therapy: WFL for tasks assessed/performed Overall Cognitive Status: Within Functional Limits for tasks assessed                                          General Comments General comments (skin integrity, edema, etc.): HR max 131, SpO2 86% at rest on RA when O2 out of her nose, stayed 94% with ambulation on 4L    Exercises Other Exercises Other Exercises: using incentive x 2 sets of 3-4 with cues for stopping when pulling <252m to rest and recover, able to pull 5063mx 1   Assessment/Plan    PT Assessment Patient needs continued PT services  PT Problem List Cardiopulmonary status limiting activity;Decreased balance;Decreased strength;Decreased activity tolerance;Decreased mobility       PT Treatment Interventions DME instruction;Functional mobility training;Balance training;Patient/family education;Therapeutic activities;Gait training;Therapeutic exercise    PT Goals (Current goals can be found in the Care Plan section)  Acute Rehab PT Goals Patient Stated Goal: to breathe better PT Goal Formulation: With patient Time For Goal Achievement: 10/28/22 Potential to Achieve Goals: Good    Frequency Min 3X/week     Co-evaluation               AM-PAC PT "6 Clicks" Mobility  Outcome Measure Help needed turning from your back to your side while in a flat bed without using bedrails?: None Help needed moving from lying on your back to sitting on the side of a flat bed without using  bedrails?: A Little Help needed moving to and from a bed to a chair (including a wheelchair)?: A Little Help needed standing up from a chair using your arms (e.g., wheelchair or bedside chair)?: A Little Help needed to walk in hospital room?: A Little Help needed climbing 3-5 steps with a railing? : Total 6 Click Score: 17    End of Session Equipment Utilized During Treatment: Oxygen Activity Tolerance: Patient limited by fatigue Patient left: in chair;with call bell/phone within reach   PT Visit Diagnosis: Muscle weakness (generalized) (M62.81);Difficulty in walking, not elsewhere classified (R26.2)    Time: [id number] Time Calculation (min) (ACUTE ONLY): 40 min   Charges:   PT Evaluation $PT Eval Moderate Complexity: 1 Mod PT Treatments $Gait Training: 8-22 mins $Therapeutic Activity: 8-22 mins        CyMagda KielPT Acute Rehabilitation Services Office:[phone] 10/15/2022   CyReginia Naas/17/2024, 1:34 PM

## 2022-10-15 NOTE — Progress Notes (Signed)
NAME:  Julie King, MRN:  [id number], DOB:  [birth year], LOS: 1 ADMISSION DATE:  10/14/2022, CONSULTATION DATE:  10/14/22 REFERRING MD:  Tamala Julian CHIEF COMPLAINT:  Dyspnea   History of Present Illness:  Julie King is a 32 y.o. female who is known to our practice for lingular mass. She has had robotic biopsy at [facility] 07/05/22 which was negative for malignancy. She had repeat CT scans that showed increase in size and also developed hemoptysis; therefore, she was seen at [facility] in December 2023. She underwent bronch and biopsy again with similar results of no malignancy, just inflammatory cells. She was then referred to [facility] and ultimately had VATS with wedge resection of lingular lung mass on 2/8 (Dr. Roxan Hockey). Pathology was negative for malignancy and showed inflammation with focal necrosis, fibrosis, and pneumocyte hyperplasia. She initially did well but then around 2/14, began to experience worsening chest pain that radiated into her back along with subjective fevers, cough productive of gray-black sputum, nausea, orthopnea. She has had no drainage from her surgical site.  She presented to [facility] ED 2/15 with above symptoms. She had CTA that was negative for PE but showed post surgical changes and small to moderate L pleural effusion. EKG and troponins were negative. She was admitted by [facility] and was started on Ceftriaxone and Azithromycin.  She was seen by IR for possible thoracentesis but due to combination of increased WOB and small effusion, this was deferred.   Afternoon of 2/16, she had ongoing dyspnea despite O2 sat 98% on room air. PCCM called to see in consultation.  Pertinent  Medical History:  has Headache; Morbidly obese ([place]); History of atrial fibrillation; Essential hypertension; Family history of breast cancer; Hemoptysis; Lung mass; Mass of left lung; SIRS (systemic inflammatory response syndrome) ([place]); Healthcare-associated pneumonia; Pleural effusion; Microcytic hypochromic  anemia; Tobacco abuse; Chest pain; and Pneumonia on their problem list.  Significant Hospital Events: Including procedures, antibiotic start and stop dates in addition to other pertinent events   2/16 admit, PCCM consult  Interim History / Subjective:   Continues to have pain and dyspnea in the left chest.  Febrile to 102.3 Antibiotics broadened to cefepime, Vanco, azithromycin  Objective:  Blood pressure (!) 128/92, pulse (!) 137, temperature 99.3 F (37.4 C), temperature source Oral, resp. rate (!) 25, height 5' 7"$  (1.702 m), weight (!) 145 kg, last menstrual period 09/30/2022, SpO2 92 %.       No intake or output data in the 24 hours ending 10/15/22 1753  Filed Weights   10/14/22 0152  Weight: (!) 145 kg    Examination: Blood pressure (!) 128/92, pulse (!) 137, temperature 99.3 F (37.4 C), temperature source Oral, resp. rate (!) 25, height 5' 7"$  (1.702 m), weight (!) 145 kg, last menstrual period 09/30/2022, SpO2 92 %. Gen:      No acute distress HEENT:  EOMI, sclera anicteric Neck:     No masses; no thyromegaly Lungs:    Clear to auscultation bilaterally; normal respiratory effort CV:         Regular rate and rhythm; no murmurs Abd:      + bowel sounds; soft, non-tender; no palpable masses, no distension Ext:    No edema; adequate peripheral perfusion Skin:      Warm and dry; no rash Neuro: alert and oriented x 3 Psych: normal mood and affect   Labs/imaging personally reviewed:  CTA chest 2/16 > no PE. Post operative changes in left chest with small to mod effusion.  WBC count increased to 22.5  Assessment & Plan:  Possible PNA with small to mod effusion. -Attempted IR thoracentesis but had a small volume effusion and hence procedure was deferred Agree with broadening antibiotics Concern about fevers and increased WBC count.  Will get CT chest to evaluate  Dyspnea - subjectively dyspneic, saturating OK with SpO2 high 90s on room air. Likely combination  atelectasis, anxiety, possible post op pain. Small effusion possibly contributing but doubt much  - Bronchial hygiene. - Mobilize. - Pain control.  Recent VATS with wedge resection of lingular lung mass on 2/8 (Dr. Roxan Hockey) - pathology negative for malignancy. - Pain control. - Bronchial hygiene as above. - F/u as outpatient.  Per primary team.  Signature:    Marshell Garfinkel MD [facility] See Amion for pager  If no response to pager , please call [phone] until 7pm After 7:00 pm call Elink  [phone] 10/15/2022, 5:53 PM

## 2022-10-15 NOTE — Progress Notes (Signed)
VASCULAR LAB    Bilateral lower extremity venous duplex has been performed.  See CV proc for preliminary results.   Makyi Ledo, RVT 10/15/2022, 12:00 PM

## 2022-10-15 NOTE — Progress Notes (Signed)
Pharmacy Antibiotic Note  Julie King is a 32 y.o. female admitted on 10/14/2022 with pneumonia.  Patient was initially started on CTX and azithromycin for LLL pneumonia but continues to spike fevers. Pharmacy has been consulted for vancomycin and cefepime dosing.   MRSA PCR (-), however, MD believes likely a complicated abscess on LLL and wishes to initiate vancomycin. WBC 15.9>>22.5 from yesterday, T 102 this morning, Scr 0.81.   Plan: -Give vancomycin 2g IV x1 now  -Start vancomycin 1568m IV q12hrs on 2/18 @0130$  (eAUC 483, Scr 0.81, Vd 0.5) -vanc levels as needed  -Start cefepime 2g IV q8h  -Watch renal function, cultures, and overall clinical picture  -De-escalate antibiotics as able    Height: 5' 7"$  (170.2 cm) Weight: (!) 145 kg (319 lb 10.7 oz) IBW/kg (Calculated) : 61.6  Temp (24hrs), Avg:100.4 F (38 C), Min:99.6 F (37.6 C), Max:102.3 F (39.1 C)  Recent Labs  Lab 10/13/22 1459 10/14/22 0229 10/14/22 0912 10/14/22 1709 10/15/22 0116  WBC 15.0* 15.9*  --   --  22.5*  CREATININE 0.71 0.77  --   --  0.81  LATICACIDVEN  --   --  1.1 0.9  --     Estimated Creatinine Clearance: 150.9 mL/min (by C-G formula based on SCr of 0.81 mg/dL).    Allergies  Allergen Reactions   Percocet [Oxycodone-Acetaminophen] Hives, Itching and Nausea Only   Pineapple Itching    Fresh pineapples; pineapples from a can is okay    Antimicrobials this admission: 2/16 azithromycin >> (2/18) 2/16 CTX >> 2/17 2/17 vancomycin >> 2/17 cefepime >>  Microbiology results: 2/16 BCx: NG <24hrs  2/16 Sputum: needs to be collected   2/17 MRSA PCR: neg   Thank you for allowing pharmacy to be a part of this patient's care.  SBilley Gosling PharmD PGY1 Pharmacy Resident 2/17/202412:39 PM

## 2022-10-15 NOTE — Progress Notes (Signed)
PROGRESS NOTE    Julie King  [id number] DOB: Dec 07, 1990 DOA: 10/14/2022 PCP: Hattie Perch, MD    Brief Narrative:  32 year old with recently diagnosed lingular mass, robotic biopsy 06/2022 negative for malignancy, repeat CT scans with increasing size and hemoptysis.  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  Pathology negative for malignancy and showed inflammation with focal necrosis, fibrosis and pneumocyte hyperplasia.  Discharge home after surgery.  Presents back to the emergency room with 3 days of worsening left-sided chest pain, subjective fever, cough, nausea and orthopnea.  CT angiogram with postsurgical changes small left pleural effusion.   Assessment & Plan:   Left lower lobe pneumonia, complicated parapneumonic effusion with pleurisy, postoperative inflammatory changes: Sepsis present on admission. Started on Rocephin and azithromycin.  Patient continues to spike fever.  She is postoperative and instrumentation on the left lungs.  Will broaden antibiotic coverage to cover for MRSA and Pseudomonas. Chest physiotherapy, incentive spirometry, deep breathing exercises, sputum induction, mucolytic's and bronchodilators. Sputum cultures, blood cultures, pending. Surgical culture from 2/8 with Streptococcus. Supplemental oxygen to keep saturations more than 90%. Adequate pain medications, mobilize. Appreciate pulmonary follow-up. CT angiogram negative for PE, DVT studies pending. IR attempted thoracentesis, no enough space to drain fluid. Recheck x-ray tomorrow.    DVT prophylaxis: enoxaparin (LOVENOX) injection 40 mg Start: 10/14/22 1115   Code Status: Full code Family Communication: None at the bedside Disposition Plan: Status is: Inpatient Remains inpatient appropriate because: Severe pleuritic chest pain, IV antibiotics.     Consultants:  Pulmonary  Procedures:  None  Antimicrobials:  Rocephin azithromycin  2/16--- Vancomycin and cefepime 2/17--   Subjective: Patient seen and examined in the morning rounds.  Overnight events noted.  Temperature 102.3.  Tachycardic.  Continues to feel pain on the left side.  Unable to take deep breaths. Patient had multiple questions and I tried to answer them. She was really anxious about getting food accumulated, infected left lower lung. WBC elevated.   Objective: Vitals:   10/15/22 0415 10/15/22 0700 10/15/22 0745 10/15/22 0900  BP: 134/83 135/81 135/81 113/60  Pulse: (!) 137 (!) 138 (!) 138 (!) 133  Resp: 19 (!) 28 (!) 28 (!) 22  Temp: 100.3 F (37.9 C) (!) 102.3 F (39.1 C) (!) 102.3 F (39.1 C) 99.6 F (37.6 C)  TempSrc: Oral Oral  Oral  SpO2: (!) 89% 94%    Weight:      Height:       No intake or output data in the 24 hours ending 10/15/22 1211 Filed Weights   10/14/22 0152  Weight: (!) 145 kg    Examination:  General exam: Appears anxious.  Mild respiratory distress. Respiratory system: Poor inspiratory effort.  Poor air entry at the left side.  On minimal oxygen. Cardiovascular system: S1 & S2 heard, RRR.  Tachycardic.   Gastrointestinal system: Abdomen is nondistended, soft and nontender. No organomegaly or masses felt. Normal bowel sounds heard. Central nervous system: Alert and oriented. No focal neurological deficits. Extremities: Symmetric 5 x 5 power. Skin: No rashes, lesions or ulcers    Data Reviewed: I have personally reviewed following labs and imaging studies  CBC: Recent Labs  Lab 10/13/22 1459 10/14/22 0229 10/15/22 0116  WBC 15.0* 15.9* 22.5*  NEUTROABS  --  12.7*  --   HGB 9.6* 10.4* 9.5*  HCT 32.7* 34.5* 31.3*  MCV 60.2* 59.6* 59.3*  PLT 286 299 [id number]   Basic Metabolic Panel: Recent Labs  Lab 10/13/22 1459 10/14/22 0229 10/15/22 0116  NA 135 134* 134*  K 4.3 4.2 4.2  CL 99 99 99  CO2 26 21* 25  GLUCOSE 89 114* 144*  BUN 9 11 9  $ CREATININE 0.71 0.77 0.81  CALCIUM 9.0 9.1 8.8*    GFR: Estimated Creatinine Clearance: 150.9 mL/min (by C-G formula based on SCr of 0.81 mg/dL). Liver Function Tests: Recent Labs  Lab 10/14/22 0850  AST 19  ALT 18  ALKPHOS 77  BILITOT 0.6  PROT 7.2  ALBUMIN 3.1*   No results for input(s): "LIPASE", "AMYLASE" in the last 168 hours. No results for input(s): "AMMONIA" in the last 168 hours. Coagulation Profile: No results for input(s): "INR", "PROTIME" in the last 168 hours. Cardiac Enzymes: No results for input(s): "CKTOTAL", "CKMB", "CKMBINDEX", "TROPONINI" in the last 168 hours. BNP (last 3 results) No results for input(s): "PROBNP" in the last 8760 hours. HbA1C: No results for input(s): "HGBA1C" in the last 72 hours. CBG: No results for input(s): "GLUCAP" in the last 168 hours. Lipid Profile: No results for input(s): "CHOL", "HDL", "LDLCALC", "TRIG", "CHOLHDL", "LDLDIRECT" in the last 72 hours. Thyroid Function Tests: No results for input(s): "TSH", "T4TOTAL", "FREET4", "T3FREE", "THYROIDAB" in the last 72 hours. Anemia Panel: No results for input(s): "VITAMINB12", "FOLATE", "FERRITIN", "TIBC", "IRON", "RETICCTPCT" in the last 72 hours. Sepsis Labs: Recent Labs  Lab 10/14/22 0912 10/14/22 1709 10/15/22 0116  PROCALCITON  --  <0.10 0.15  LATICACIDVEN 1.1 0.9  --     Recent Results (from the past 240 hour(s))  Fungus Culture With Stain     Status: None (Preliminary result)   Collection Time: 10/06/22 10:17 AM   Specimen: PATH Soft tissue resection  Result Value Ref Range Status   Fungus Stain Final report  Final    Comment: (NOTE) Performed At: [facility] [place], [place] [id number] Rush Farmer MD [id number]    Fungus (Mycology) Culture PENDING  Incomplete   Fungal Source TISSUE  Final    Comment: Performed at [facility], [place] [address], [place], [place] [address]  Acid Fast Smear (AFB)     Status: None   Collection Time: 10/06/22 10:17 AM   Specimen: PATH Soft tissue  resection  Result Value Ref Range Status   AFB Specimen Processing Concentration  Final   Acid Fast Smear Negative  Final    Comment: (NOTE) Performed At: [facility] [place], [place] [id number] Rush Farmer MD [id number]    Source (AFB) TISSUE  Final    Comment: Performed at [facility], [place] [address] [place], [place] [address]  Fungus Culture Result     Status: None   Collection Time: 10/06/22 10:17 AM  Result Value Ref Range Status   Result 1 Comment  Final    Comment: (NOTE) KOH/Calcofluor preparation:  no fungus observed. Performed At: [facility] [place], [place] [id number] Rush Farmer MD [id number]   Aerobic/Anaerobic Culture w Gram Stain (surgical/deep wound)     Status: None (Preliminary result)   Collection Time: 10/06/22 10:21 AM   Specimen: PATH Soft tissue resection  Result Value Ref Range Status   Specimen Description TISSUE  Final   Special Requests  LUL WEDGE  Final   Gram Stain   Final    FEW WBC PRESENT,BOTH PMN AND MONONUCLEAR NO ORGANISMS SEEN    Culture   Final    FEW STREPTOCOCCUS INTERMEDIUS NO ANAEROBES ISOLATED; CULTURE IN PROGRESS  FOR 5 DAYS CONTINUING TO HOLD Performed at [facility] [address] [place], [place] [address]    Report Status PENDING  Incomplete   Organism ID, Bacteria STREPTOCOCCUS INTERMEDIUS  Final      Susceptibility   Streptococcus intermedius - MIC*    PENICILLIN <=0.06 SENSITIVE Sensitive     CEFTRIAXONE <=0.12 SENSITIVE Sensitive     LEVOFLOXACIN <=0.25 SENSITIVE Sensitive     VANCOMYCIN 0.25 SENSITIVE Sensitive     * FEW STREPTOCOCCUS INTERMEDIUS  Resp panel by RT-PCR (RSV, Flu A&B, Covid) Anterior Nasal Swab     Status: None   Collection Time: 10/14/22  3:32 AM   Specimen: Anterior Nasal Swab  Result Value Ref Range Status   SARS Coronavirus 2 by RT PCR NEGATIVE NEGATIVE Final   Influenza A by PCR NEGATIVE NEGATIVE Final   Influenza B by  PCR NEGATIVE NEGATIVE Final    Comment: (NOTE) The Xpert Xpress SARS-CoV-2/FLU/RSV plus assay is intended as an aid in the diagnosis of influenza from Nasopharyngeal swab specimens and should not be used as a sole basis for treatment. Nasal washings and aspirates are unacceptable for Xpert Xpress SARS-CoV-2/FLU/RSV testing.  Fact Sheet for Patients: EntrepreneurPulse.com.au  Fact Sheet for Healthcare Providers: IncredibleEmployment.be  This test is not yet approved or cleared by the [place] FDA and has been authorized for detection and/or diagnosis of SARS-CoV-2 by FDA under an Emergency Use Authorization (EUA). This EUA will remain in effect (meaning this test can be used) for the duration of the COVID-19 declaration under Section 564(b)(1) of the Act, 21 U.S.C. section 360bbb-3(b)(1), unless the authorization is terminated or revoked.     Resp Syncytial Virus by PCR NEGATIVE NEGATIVE Final    Comment: (NOTE) Fact Sheet for Patients: EntrepreneurPulse.com.au  Fact Sheet for Healthcare Providers: IncredibleEmployment.be  This test is not yet approved or cleared by the [place] FDA and has been authorized for detection and/or diagnosis of SARS-CoV-2 by FDA under an Emergency Use Authorization (EUA). This EUA will remain in effect (meaning this test can be used) for the duration of the COVID-19 declaration under Section 564(b)(1) of the Act, 21 U.S.C. section 360bbb-3(b)(1), unless the authorization is terminated or revoked.  Performed at [facility], [place] [address] [place], [place] [address]   Blood culture (routine x 2)     Status: None (Preliminary result)   Collection Time: 10/14/22  7:11 AM   Specimen: BLOOD  Result Value Ref Range Status   Specimen Description BLOOD RIGHT ANTECUBITAL  Final   Special Requests   Final    BOTTLES DRAWN AEROBIC AND ANAEROBIC Blood Culture results  may not be optimal due to an inadequate volume of blood received in culture bottles   Culture   Final    NO GROWTH < 24 HOURS Performed at [facility], [place] [address], [place], [place] [address]    Report Status PENDING  Incomplete  Blood culture (routine x 2)     Status: None (Preliminary result)   Collection Time: 10/14/22  5:09 PM   Specimen: BLOOD LEFT HAND  Result Value Ref Range Status   Specimen Description BLOOD LEFT HAND  Final   Special Requests   Final    BOTTLES DRAWN AEROBIC AND ANAEROBIC Blood Culture adequate volume   Culture   Final    NO GROWTH < 24 HOURS Performed at [facility], [place] [address], [place], [place] [address]    Report Status PENDING  Incomplete  MRSA Next Gen by PCR, Nasal     Status: None   Collection Time: 10/15/22  8:32 AM   Specimen: Nasal Mucosa; Nasal Swab  Result Value Ref Range Status   MRSA by PCR Next Gen NOT DETECTED NOT DETECTED Final    Comment: (NOTE) The GeneXpert MRSA Assay (FDA approved for NASAL specimens only), is one component of a comprehensive MRSA colonization surveillance program. It is not intended to diagnose MRSA infection nor to guide or monitor treatment for MRSA infections. Test performance is not FDA approved in patients less than 76 years old. Performed at [facility], [place] [address], [place], [place] [address]          Radiology Studies: CT Angio Chest PE W and/or Wo Contrast  Result Date: 10/14/2022 CLINICAL DATA:  Pulmonary embolism suspected, high probability EXAM: CT ANGIOGRAPHY CHEST WITH CONTRAST TECHNIQUE: Multidetector CT imaging of the chest was performed using the standard protocol during bolus administration of intravenous contrast. Multiplanar CT image reconstructions and MIPs were obtained to evaluate the vascular anatomy. RADIATION DOSE REDUCTION: This exam was performed according to the departmental dose-optimization program which includes automated exposure control,  adjustment of the mA and/or kV according to patient size and/or use of iterative reconstruction technique. CONTRAST:  21m OMNIPAQUE IOHEXOL 350 MG/ML SOLN COMPARISON:  Yesterday FINDINGS: Cardiovascular: Satisfactory pulmonary artery timing but limited detail of subsegmental vessels due to body habitus primarily. There is also complicating motion artifact. No visible pulmonary emboli. Normal heart size without pericardial effusion. Normal aorta. Mediastinum/Nodes: Negative for adenopathy or mass Lungs/Pleura: Worsening opacity at the lung bases, especially on the left with volume loss. Moderate left pleural effusion. No pneumothorax. Upper Abdomen: No acute finding. Musculoskeletal: No acute finding Review of the MIP images confirms the above findings. IMPRESSION: 1. No visible pulmonary embolism. Suboptimal peripheral vessel evaluation due to motion and body habitus. 2. Postoperative left chest with worsening atelectasis and moderate left pleural effusion. Electronically Signed   By: JJorje GuildM.D.   On: 10/14/2022 06:28   DG Chest Portable 1 View  Result Date: 10/14/2022 CLINICAL DATA:  Dyspnea EXAM: PORTABLE CHEST 1 VIEW COMPARISON:  10/13/2022 FINDINGS: Lung volumes are small. Left basilar consolidation and small left pleural effusion again identified with slight interval increase in left pleural fluid. Right basilar discoid atelectasis again noted. No pneumothorax. No pleural effusion on the right. Cardiac size is at the upper limits of normal. IMPRESSION: 1. Slight interval increase in left pleural fluid. Stable left basilar consolidation. 2. Pulmonary hypoinflation. Electronically Signed   By: AFidela SalisburyM.D.   On: 10/14/2022 02:30   CT Angio Chest PE W/Cm &/Or Wo Cm  Result Date: 10/13/2022 CLINICAL DATA:  Status post left VATS and wedge resection of lung nodule involving upper and lower lobes left lung. EXAM: CT ANGIOGRAPHY CHEST WITH CONTRAST TECHNIQUE: Multidetector CT imaging of the  chest was performed using the standard protocol during bolus administration of intravenous contrast. Multiplanar CT image reconstructions and MIPs were obtained to evaluate the vascular anatomy. RADIATION DOSE REDUCTION: This exam was performed according to the departmental dose-optimization program which includes automated exposure control, adjustment of the mA and/or kV according to patient size and/or use of iterative reconstruction technique. CONTRAST:  81mOMNIPAQUE IOHEXOL 350 MG/ML SOLN COMPARISON:  Chest CTA 09/30/2022 FINDINGS: Cardiovascular: Heart size upper normal. No thoracic aortic aneurysm. No large central pulmonary embolus in the main pulmonary arteries or lobar/segmental pulmonary arteries to either lung. Subsegmental pulmonary arteries  in the lower lobes are not reliably evaluated due to motion artifact. Mediastinum/Nodes: No mediastinal lymphadenopathy. No hilar lymphadenopathy. The esophagus has normal imaging features. There is no axillary lymphadenopathy. Lungs/Pleura: Subsegmental atelectasis noted right lower lobe. There is patchy collapse/consolidation in the posterior lingula and anterior left lower lobe, in proximity to the staple line from wedge resection. Dependent atelectasis noted in the posterior left lower lobe. No right pleural effusion with small to moderate left pleural effusion evident including some fluid probably loculated in the anterior pleural space (image 191/7). Upper Abdomen: Visualized portion of the upper abdomen is unremarkable. Musculoskeletal: No worrisome lytic or sclerotic osseous abnormality. Review of the MIP images confirms the above findings. IMPRESSION: 1. No large central pulmonary embolus in the main pulmonary arteries or lobar/segmental pulmonary arteries to either lung. Subsegmental pulmonary arteries in the lower lobes are not reliably evaluated due to motion artifact. 2. Patchy collapse/consolidation in the posterior lingula and anterior left lower  lobe, in proximity to the staple line from recent wedge resection. 3. Subsegmental atelectasis noted right lower lobe and dependent left lower lobe. 4. Small to moderate left pleural effusion including some fluid probably loculated in the anterior pleural space. Electronically Signed   By: Misty Stanley M.D.   On: 10/13/2022 18:06   DG Chest Port 1 View  Result Date: 10/13/2022 CLINICAL DATA:  Chest pain, status post left wedge resection EXAM: PORTABLE CHEST 1 VIEW COMPARISON:  10/08/2022 FINDINGS: Interval removal of a previously seen right neck vascular catheter. The heart size and mediastinal contours are within normal limits. Heterogeneous airspace opacity of the left lung base with elevation of the left hemidiaphragm. Bandlike scarring or atelectasis of the right lung base. The visualized skeletal structures are unremarkable. IMPRESSION: 1. Heterogeneous airspace opacity of the left lung base with elevation of the left hemidiaphragm, appearance in keeping with reported left wedge resection. 2. Bandlike scarring or atelectasis of the right lung base. Electronically Signed   By: Delanna Ahmadi M.D.   On: 10/13/2022 14:21        Scheduled Meds:  enoxaparin (LOVENOX) injection  40 mg Subcutaneous Q24H   gabapentin  300 mg Oral BID   guaiFENesin  600 mg Oral BID   ipratropium-albuterol  3 mL Nebulization QID   sodium chloride flush  3 mL Intravenous Q12H   Continuous Infusions:     LOS: 1 day    Time spent: 35 minutes    Barb Merino, MD [facility] Pager [phone]

## 2022-10-16 DIAGNOSIS — R6510 Systemic inflammatory response syndrome (SIRS) of non-infectious origin without acute organ dysfunction: Secondary | ICD-10-CM | POA: Diagnosis not present

## 2022-10-16 LAB — CBC WITH DIFFERENTIAL/PLATELET
Abs Immature Granulocytes: 0.12 10*3/uL — ABNORMAL HIGH (ref 0.00–0.07)
Basophils Absolute: 0.1 10*3/uL (ref 0.0–0.1)
Basophils Relative: 0 %
Eosinophils Absolute: 0 10*3/uL (ref 0.0–0.5)
Eosinophils Relative: 0 %
HCT: 28.2 % — ABNORMAL LOW (ref 36.0–46.0)
Hemoglobin: 8.7 g/dL — ABNORMAL LOW (ref 12.0–15.0)
Immature Granulocytes: 1 %
Lymphocytes Relative: 10 %
Lymphs Abs: 2.1 10*3/uL (ref 0.7–4.0)
MCH: 18.1 pg — ABNORMAL LOW (ref 26.0–34.0)
MCHC: 30.9 g/dL (ref 30.0–36.0)
MCV: 58.8 fL — ABNORMAL LOW (ref 80.0–100.0)
Monocytes Absolute: 1.8 10*3/uL — ABNORMAL HIGH (ref 0.1–1.0)
Monocytes Relative: 8 %
Neutro Abs: 17.1 10*3/uL — ABNORMAL HIGH (ref 1.7–7.7)
Neutrophils Relative %: 81 %
Platelets: 269 10*3/uL (ref 150–400)
RBC: 4.8 MIL/uL (ref 3.87–5.11)
RDW: 20.7 % — ABNORMAL HIGH (ref 11.5–15.5)
WBC: 21.2 10*3/uL — ABNORMAL HIGH (ref 4.0–10.5)
nRBC: 0.2 % (ref 0.0–0.2)

## 2022-10-16 LAB — COMPREHENSIVE METABOLIC PANEL
ALT: 18 U/L (ref 0–44)
AST: 17 U/L (ref 15–41)
Albumin: 2.8 g/dL — ABNORMAL LOW (ref 3.5–5.0)
Alkaline Phosphatase: 81 U/L (ref 38–126)
Anion gap: 11 (ref 5–15)
BUN: 8 mg/dL (ref 6–20)
CO2: 23 mmol/L (ref 22–32)
Calcium: 8.6 mg/dL — ABNORMAL LOW (ref 8.9–10.3)
Chloride: 99 mmol/L (ref 98–111)
Creatinine, Ser: 0.63 mg/dL (ref 0.44–1.00)
GFR, Estimated: >60 mL/min (ref >60)
Glucose, Bld: 119 mg/dL — ABNORMAL HIGH (ref 70–99)
Potassium: 4.3 mmol/L (ref 3.5–5.1)
Sodium: 133 mmol/L — ABNORMAL LOW (ref 135–145)
Total Bilirubin: 0.6 mg/dL (ref 0.3–1.2)
Total Protein: 7.1 g/dL (ref 6.5–8.1)

## 2022-10-16 LAB — MAGNESIUM: Magnesium: 1.9 mg/dL (ref 1.7–2.4)

## 2022-10-16 MED ORDER — FLUCONAZOLE 200 MG PO TABS
200.0000 mg | ORAL_TABLET | Freq: Once | ORAL | Status: AC
  Administered 2022-10-16: 200 mg via ORAL
  Filled 2022-10-16: qty 1

## 2022-10-16 MED ORDER — MICONAZOLE NITRATE POWD: Freq: Two times a day (BID) | Status: DC

## 2022-10-16 NOTE — Progress Notes (Signed)
NAME:  Julie King, MRN:  [id number], DOB:  [birth year], LOS: 2 ADMISSION DATE:  10/14/2022, CONSULTATION DATE:  10/14/22 REFERRING MD:  Tamala Julian CHIEF COMPLAINT:  Dyspnea   History of Present Illness:  Julie King is a 32 y.o. female who is known to our practice for lingular mass. She has had robotic biopsy at [facility] 07/05/22 which was negative for malignancy. She had repeat CT scans that showed increase in size and also developed hemoptysis; therefore, she was seen at [facility] in December 2023. She underwent bronch and biopsy again with similar results of no malignancy, just inflammatory cells. She was then referred to [facility] and ultimately had VATS with wedge resection of lingular lung mass on 2/8 (Dr. Roxan Hockey). Pathology was negative for malignancy and showed inflammation with focal necrosis, fibrosis, and pneumocyte hyperplasia. She initially did well but then around 2/14, began to experience worsening chest pain that radiated into her back along with subjective fevers, cough productive of gray-black sputum, nausea, orthopnea. She has had no drainage from her surgical site.  She presented to [facility] ED 2/15 with above symptoms. She had CTA that was negative for PE but showed post surgical changes and small to moderate L pleural effusion. EKG and troponins were negative. She was admitted by [facility] and was started on Ceftriaxone and Azithromycin.  She was seen by IR for possible thoracentesis but due to combination of increased WOB and small effusion, this was deferred.   Afternoon of 2/16, she had ongoing dyspnea despite O2 sat 98% on room air. PCCM called to see in consultation.  Pertinent  Medical History:  has Headache; Morbidly obese ([place]); History of atrial fibrillation; Essential hypertension; Family history of breast cancer; Hemoptysis; Lung mass; Mass of left lung; SIRS (systemic inflammatory response syndrome) ([place]); Healthcare-associated pneumonia; Pleural effusion; Microcytic hypochromic  anemia; Tobacco abuse; Chest pain; and Pneumonia on their problem list.  Significant Hospital Events: Including procedures, antibiotic start and stop dates in addition to other pertinent events   2/16 admit, PCCM consult  Interim History / Subjective:   Continues to have pain and dyspnea in the left chest.  Febrile to 101.7  Objective:  Blood pressure 121/76, pulse (!) 137, temperature (!) 101.7 F (38.7 C), temperature source Axillary, resp. rate 18, height 5' 7"$  (1.702 m), weight (!) 145 kg, last menstrual period 09/30/2022, SpO2 98 %.        Intake/Output Summary (Last 24 hours) at 10/16/2022 1246 Last data filed at 10/16/2022 0900 Gross per 24 hour  Intake 613.48 ml  Output 1015 ml  Net -401.52 ml    Filed Weights   10/14/22 0152  Weight: (!) 145 kg    Examination: Gen:      Mild distress HEENT:  EOMI, sclera anicteric Neck:     No masses; no thyromegaly Lungs:    Clear to auscultation bilaterally; normal respiratory effort CV:         Regular rate and rhythm; no murmurs Abd:      + bowel sounds; soft, non-tender; no palpable masses, no distension Ext:    No edema; adequate peripheral perfusion Skin:      Warm and dry; no rash Neuro: alert and oriented x 3 Psych: normal mood and affect   Labs/imaging personally reviewed:  CTA chest 2/16 > no PE. Post operative changes in left chest with small to mod effusion.  WBC count 21.1  Assessment & Plan:  Possible PNA with small to mod effusion. -Attempted IR thoracentesis but had a  small volume effusion and hence procedure was deferred Agree with broadening antibiotics Concern about fevers and increased WBC count.  Will get CT chest to evaluate  Dyspnea - subjectively dyspneic, saturating OK with SpO2 high 90s on room air. Likely combination atelectasis, anxiety, possible post op pain. Small effusion possibly contributing but doubt much  - Bronchial hygiene. - Mobilize. - Pain control.  Recent VATS with wedge  resection of lingular lung mass on 2/8 (Dr. Roxan Hockey) - pathology negative for malignancy. - Pain control. - Bronchial hygiene as above. - F/u as outpatient.  Per primary team.  Signature:    Marshell Garfinkel MD [facility] See Amion for pager  If no response to pager , please call [phone] until 7pm After 7:00 pm call Elink  [phone] 10/16/2022, 12:46 PM

## 2022-10-16 NOTE — Evaluation (Signed)
Occupational Therapy Evaluation Patient Details Name: Julie King MRN: [id number] DOB: 07/15/1991 Today's Date: 10/16/2022   History of Present Illness 32 year old with recently diagnosed lingular mass, robotic biopsy 06/2022 negative for malignancy, repeat CT scans with increasing size and hemoptysis.  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   Admitted due to L side chest pain, fever, cough and orthopnea found to have L pleural effusion.   Clinical Impression   Prior to this admission, patient was able to ambulate and complete ADLs, but with great difficulty. 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 Currently, evaluation limited to bed level due to patient with HR at 130 throughout evaluation, lethargic, requiring increased time for all answers, on 5L of oxygen and requiring cues to slow breathing down due to anxiety. Pain also noted in L chest, and anxious and emotional as to why her HR is so high and why she cant do more. Patient mod A for all ADLs, with mobility deferred due to HR. OT recommending [place] at this time, however if trend continues, may require higher intensity rehab. OT will continue to follow.      Recommendations for follow up therapy are one component of a multi-disciplinary discharge planning process, led by the attending physician.  Recommendations may be updated based on patient status, additional functional criteria and insurance authorization.   Follow Up Recommendations  Home health OT     Assistance Recommended at Discharge Frequent or constant Supervision/Assistance (initially)  Patient can return home with the following A lot of help with walking and/or transfers;A lot of help with bathing/dressing/bathroom;Direct supervision/assist for medications management;Direct supervision/assist for financial management;Assist for transportation;Help with stairs or ramp for entrance;Assistance  with cooking/housework    Functional Status Assessment  Patient has had a recent decline in their functional status and demonstrates the ability to make significant improvements in function in a reasonable and predictable amount of time.  Equipment Recommendations  Other (comment) (Will continue to assess pending progress)    Recommendations for Other Services       Precautions / Restrictions Precautions Precautions: Fall Precaution Comments: watch HR, SpO2 Restrictions Weight Bearing Restrictions: No      Mobility Bed Mobility Overal bed mobility: Needs Assistance             General bed mobility comments: unable to come to EOB due to HR already at 130 and patient lethargic requiring increased cues to maintain attention    Transfers Overall transfer level: Needs assistance                 General transfer comment: deferred due to HR      Balance Overall balance assessment: Needs assistance Sitting-balance support: Feet supported Sitting balance-Leahy Scale: Good                                     ADL either performed or assessed with clinical judgement   ADL Overall ADL's : Needs assistance/impaired Eating/Feeding: Set up;Bed level   Grooming: Set up;Bed level   Upper Body Bathing: Minimal assistance;Bed level   Lower Body Bathing: Total assistance;Bed level   Upper Body Dressing : Minimal assistance;Bed level   Lower Body Dressing: Total assistance;Sitting/lateral leans;Sit to/from stand               Functional mobility during ADLs: Moderate assistance;Cueing for sequencing;Cueing for safety  General ADL Comments: Patient with HR at 130 throughout evaluation, lethargic, requiring increased time for all answers, on 5L of oxygen and requiring cues to slow breathing down due to anxiety. Pain also noted in L chest, and anxious and emotional as to why her HR is so high and why she cant do more.     Vision Baseline  Vision/History: 0 No visual deficits Ability to See in Adequate Light: 0 Adequate Patient Visual Report: No change from baseline       Perception     Praxis      Pertinent Vitals/Pain Pain Assessment Pain Assessment: Faces Faces Pain Scale: Hurts even more Pain Location: L chest Pain Descriptors / Indicators: Discomfort Pain Intervention(s): Limited activity within patient's tolerance, Monitored during session, Repositioned     Hand Dominance Right   Extremity/Trunk Assessment Upper Extremity Assessment Upper Extremity Assessment: Generalized weakness   Lower Extremity Assessment Lower Extremity Assessment: Defer to PT evaluation   Cervical / Trunk Assessment Cervical / Trunk Assessment: Other exceptions Cervical / Trunk Exceptions: Increased body habitus   Communication Communication Communication: No difficulties   Cognition Arousal/Alertness: Lethargic Behavior During Therapy: Anxious, Restless Overall Cognitive Status: Impaired/Different from baseline Area of Impairment: Orientation, Attention, Memory, Following commands, Safety/judgement, Awareness, Problem solving                 Orientation Level: Time Current Attention Level: Selective Memory: Decreased short-term memory Following Commands: Follows one step commands with increased time, Follows multi-step commands with increased time   Awareness: Emergent Problem Solving: Slow processing, Decreased initiation, Requires verbal cues, Requires tactile cues General Comments: Patient often closing eyes in session, requiring reorientation. Patient also anxious with regard to HR, and emotional requiring increased time to console     General Comments  HR at 131 at rest, on 5L of oxygen upon OT entry, RN notified    Exercises     Shoulder Instructions      Home Living Family/patient expects to be discharged to:: Private residence Living Arrangements: Other relatives Available Help at Discharge:  Family;Available PRN/intermittently Type of Home: Apartment Home Access: Level entry     Home Layout: One level     Bathroom Shower/Tub: Teacher, early years/pre: Standard Bathroom Accessibility: No   Home Equipment: BSC/3in1;Rollator (4 wheels)          Prior Functioning/Environment Prior Level of Function : Independent/Modified Independent;Working/employed;Driving             Mobility Comments: independent ADLs Comments: prior to surgery was working for [employer] and driving, since surgery has been able to complete but with great effort        OT Problem List: Decreased strength;Decreased range of motion;Decreased activity tolerance;Impaired balance (sitting and/or standing);Decreased coordination;Decreased cognition;Decreased safety awareness;Decreased knowledge of use of DME or AE;Decreased knowledge of precautions;Cardiopulmonary status limiting activity;Obesity;Pain      OT Treatment/Interventions: Self-care/ADL training;Therapeutic exercise;Energy conservation;DME and/or AE instruction;Therapeutic activities;Patient/family education;Balance training    OT Goals(Current goals can be found in the care plan section) Acute Rehab OT Goals Patient Stated Goal: to feel better OT Goal Formulation: With patient Time For Goal Achievement: 10/30/22 Potential to Achieve Goals: Good  OT Frequency: Min 2X/week    Co-evaluation              AM-PAC OT "6 Clicks" Daily Activity     Outcome Measure Help from another person eating meals?: A Little Help from another person taking care of personal grooming?: A Little Help from another  person toileting, which includes using toliet, bedpan, or urinal?: A Lot Help from another person bathing (including washing, rinsing, drying)?: A Lot Help from another person to put on and taking off regular upper body clothing?: A Little Help from another person to put on and taking off regular lower body clothing?: Total 6 Click  Score: 14   End of Session Nurse Communication: Mobility status;Other (comment) (HR)  Activity Tolerance: Treatment limited secondary to medical complications (Comment) Patient left: in bed;with call bell/phone within reach;with bed alarm set  OT Visit Diagnosis: Unsteadiness on feet (R26.81);Muscle weakness (generalized) (M62.81);Pain Pain - Right/Left: Left Pain - part of body:  (chest)                Time: [id number] OT Time Calculation (min): 14 min Charges:  OT General Charges $OT Visit: 1 Visit OT Evaluation $OT Eval Moderate Complexity: 1 Mod  Corinne Ports E. Karletta Millay, OTR/L Acute Rehabilitation Services [phone]   Ascencion Dike 10/16/2022, 3:05 PM

## 2022-10-16 NOTE — Progress Notes (Signed)
PROGRESS NOTE    Julie King  [id number] DOB: [birth year] DOA: 10/14/2022 PCP: Hattie Perch, MD    Brief Narrative:  32 year old with recently diagnosed lingular mass, robotic biopsy 06/2022 negative for malignancy, repeat CT scans with increasing size and hemoptysis.  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  Pathology negative for malignancy and showed inflammation with focal necrosis, fibrosis and pneumocyte hyperplasia.  Discharge home after surgery.  Presents back to the emergency room with 3 days of worsening left-sided chest pain, subjective fever, cough, nausea and orthopnea.  CT angiogram with postsurgical changes small left pleural effusion.   Assessment & Plan:   Left lower lobe pneumonia, complicated parapneumonic effusion with pleurisy, Postoperative pleural space infection.  Sepsis present on admission.  She is postoperative and instrumentation on the left lungs.  On broad antibiotic coverage with azithromycin, vancomycin and cefepime. Continue aggressive chest physiotherapy, incentive spirometry, deep breathing exercises, sputum induction, mucolytic's and bronchodilators. Sputum cultures 2/17, rare gram-positive cocci. Blood cultures, negative so far. Surgical culture from 2/8 with Streptococcus. Supplemental oxygen to keep saturations more than 90%. Adequate pain medications, mobilize. Appreciate pulmonary follow-up. CT angiogram negative for PE, DVT studies negative. IR attempted thoracentesis, no enough space to drain fluid. Repeat CT scan without contrast today, if any collectible area of liquid, will request IR to do diagnostic thoracentesis.  Abnormal urine: Check urine culture.  1 dose of Diflucan.    DVT prophylaxis: enoxaparin (LOVENOX) injection 40 mg Start: 10/14/22 1115   Code Status: Full code Family Communication: Mother at the bedside.  Friend at the bedside. Disposition Plan: Status is: Inpatient Remains  inpatient appropriate because: Severe pleuritic chest pain, IV antibiotics.     Consultants:  Pulmonary  Procedures:  None  Antimicrobials:  Rocephin azithromycin 2/16--- Azithromycin, vancomycin and cefepime 2/17--   Subjective:  Patient seen and examined.  Continues to have fever but less intensity today.  Family members at the bedside.  Continues to have pain left lower chest wall, difficult to take deep breaths.  She is out in the chair today.  On 5 L of oxygen.  Still needing frequent pain medications.  Patient stated nebulizer did help her.   Objective: Vitals:   10/16/22 0700 10/16/22 0730 10/16/22 0801 10/16/22 1156  BP: 121/86  121/76   Pulse: (!) 137  (!) 137   Resp: (!) 25  18   Temp:   (!) 101.7 F (38.7 C)   TempSrc:   Axillary   SpO2: 93% 94% 91% 98%  Weight:      Height:        Intake/Output Summary (Last 24 hours) at 10/16/2022 1221 Last data filed at 10/16/2022 0900 Gross per 24 hour  Intake 613.48 ml  Output 1015 ml  Net -401.52 ml   Filed Weights   10/14/22 0152  Weight: (!) 145 kg    Examination:  General exam: Anxious.  In mild distress.  Able to talk in complete sentences.  Febrile on my exam. Respiratory system: Poor inspiratory effort.  Poor air entry at the left side.   SpO2: 98 % O2 Flow Rate (L/min): 5 L/min  Cardiovascular system: S1 & S2 heard, RRR.  Tachycardic.   Gastrointestinal system: Abdomen is nondistended, soft and nontender. No organomegaly or masses felt. Normal bowel sounds heard.  Obese and pendulous. Central nervous system: Alert and oriented. No focal neurological deficits. Extremities: Symmetric 5 x 5 power. Skin: No rashes, lesions or ulcers  Data Reviewed: I have personally reviewed following labs and imaging studies  CBC: Recent Labs  Lab 10/13/22 1459 10/14/22 0229 10/15/22 0116 10/16/22 0116  WBC 15.0* 15.9* 22.5* 21.2*  NEUTROABS  --  12.7*  --  17.1*  HGB 9.6* 10.4* 9.5* 8.7*  HCT 32.7* 34.5*  31.3* 28.2*  MCV 60.2* 59.6* 59.3* 58.8*  PLT 286 299 274 [id number]   Basic Metabolic Panel: Recent Labs  Lab 10/13/22 1459 10/14/22 0229 10/15/22 0116 10/16/22 0116  NA 135 134* 134* 133*  K 4.3 4.2 4.2 4.3  CL 99 99 99 99  CO2 26 21* 25 23  GLUCOSE 89 114* 144* 119*  BUN 9 11 9 8  $ CREATININE 0.71 0.77 0.81 0.63  CALCIUM 9.0 9.1 8.8* 8.6*  MG  --   --   --  1.9   GFR: Estimated Creatinine Clearance: 152.8 mL/min (by C-G formula based on SCr of 0.63 mg/dL). Liver Function Tests: Recent Labs  Lab 10/14/22 0850 10/16/22 0116  AST 19 17  ALT 18 18  ALKPHOS 77 81  BILITOT 0.6 0.6  PROT 7.2 7.1  ALBUMIN 3.1* 2.8*   No results for input(s): "LIPASE", "AMYLASE" in the last 168 hours. No results for input(s): "AMMONIA" in the last 168 hours. Coagulation Profile: No results for input(s): "INR", "PROTIME" in the last 168 hours. Cardiac Enzymes: No results for input(s): "CKTOTAL", "CKMB", "CKMBINDEX", "TROPONINI" in the last 168 hours. BNP (last 3 results) No results for input(s): "PROBNP" in the last 8760 hours. HbA1C: No results for input(s): "HGBA1C" in the last 72 hours. CBG: No results for input(s): "GLUCAP" in the last 168 hours. Lipid Profile: No results for input(s): "CHOL", "HDL", "LDLCALC", "TRIG", "CHOLHDL", "LDLDIRECT" in the last 72 hours. Thyroid Function Tests: No results for input(s): "TSH", "T4TOTAL", "FREET4", "T3FREE", "THYROIDAB" in the last 72 hours. Anemia Panel: No results for input(s): "VITAMINB12", "FOLATE", "FERRITIN", "TIBC", "IRON", "RETICCTPCT" in the last 72 hours. Sepsis Labs: Recent Labs  Lab 10/14/22 0912 10/14/22 1709 10/15/22 0116  PROCALCITON  --  <0.10 0.15  LATICACIDVEN 1.1 0.9  --     Recent Results (from the past 240 hour(s))  Resp panel by RT-PCR (RSV, Flu A&B, Covid) Anterior Nasal Swab     Status: None   Collection Time: 10/14/22  3:32 AM   Specimen: Anterior Nasal Swab  Result Value Ref Range Status   SARS Coronavirus 2 by  RT PCR NEGATIVE NEGATIVE Final   Influenza A by PCR NEGATIVE NEGATIVE Final   Influenza B by PCR NEGATIVE NEGATIVE Final    Comment: (NOTE) The Xpert Xpress SARS-CoV-2/FLU/RSV plus assay is intended as an aid in the diagnosis of influenza from Nasopharyngeal swab specimens and should not be used as a sole basis for treatment. Nasal washings and aspirates are unacceptable for Xpert Xpress SARS-CoV-2/FLU/RSV testing.  Fact Sheet for Patients: EntrepreneurPulse.com.au  Fact Sheet for Healthcare Providers: IncredibleEmployment.be  This test is not yet approved or cleared by the [place] FDA and has been authorized for detection and/or diagnosis of SARS-CoV-2 by FDA under an Emergency Use Authorization (EUA). This EUA will remain in effect (meaning this test can be used) for the duration of the COVID-19 declaration under Section 564(b)(1) of the Act, 21 U.S.C. section 360bbb-3(b)(1), unless the authorization is terminated or revoked.     Resp Syncytial Virus by PCR NEGATIVE NEGATIVE Final    Comment: (NOTE) Fact Sheet for Patients: EntrepreneurPulse.com.au  Fact Sheet for Healthcare Providers: IncredibleEmployment.be  This test is not yet  approved or cleared by the [place] and has been authorized for detection and/or diagnosis of SARS-CoV-2 by FDA under an Emergency Use Authorization (EUA). This EUA will remain in effect (meaning this test can be used) for the duration of the COVID-19 declaration under Section 564(b)(1) of the Act, 21 U.S.C. section 360bbb-3(b)(1), unless the authorization is terminated or revoked.  Performed at [facility], [place] [address] [place], [place] [address]   Blood culture (routine x 2)     Status: None (Preliminary result)   Collection Time: 10/14/22  7:11 AM   Specimen: BLOOD  Result Value Ref Range Status   Specimen Description BLOOD RIGHT ANTECUBITAL   Final   Special Requests   Final    BOTTLES DRAWN AEROBIC AND ANAEROBIC Blood Culture results may not be optimal due to an inadequate volume of blood received in culture bottles   Culture   Final    NO GROWTH 2 DAYS Performed at [facility], [place] [address], [place], [place] [address]    Report Status PENDING  Incomplete  Blood culture (routine x 2)     Status: None (Preliminary result)   Collection Time: 10/14/22  5:09 PM   Specimen: BLOOD LEFT HAND  Result Value Ref Range Status   Specimen Description BLOOD LEFT HAND  Final   Special Requests   Final    BOTTLES DRAWN AEROBIC AND ANAEROBIC Blood Culture adequate volume   Culture   Final    NO GROWTH 2 DAYS Performed at [facility], [place] [address] [place], [place] [address]    Report Status PENDING  Incomplete  MRSA Next Gen by PCR, Nasal     Status: None   Collection Time: 10/15/22  8:32 AM   Specimen: Nasal Mucosa; Nasal Swab  Result Value Ref Range Status   MRSA by PCR Next Gen NOT DETECTED NOT DETECTED Final    Comment: (NOTE) The GeneXpert MRSA Assay (FDA approved for NASAL specimens only), is one component of a comprehensive MRSA colonization surveillance program. It is not intended to diagnose MRSA infection nor to guide or monitor treatment for MRSA infections. Test performance is not FDA approved in patients less than 32 years old. Performed at [facility], [place] [address], [place], [place] [address]   Expectorated Sputum Assessment w Gram Stain, Rflx to Resp Cult     Status: None (Preliminary result)   Collection Time: 10/15/22  8:40 PM   Specimen: Sputum  Result Value Ref Range Status   Specimen Description SPUTUM  Final   Special Requests NONE  Final   Sputum evaluation   Final    THIS SPECIMEN IS ACCEPTABLE FOR SPUTUM CULTURE Performed at [facility], [place] [address] [place], [place] [address]    Report Status PENDING  Incomplete  Culture, Respiratory w Gram Stain     Status: None  (Preliminary result)   Collection Time: 10/15/22  8:40 PM   Specimen: SPU  Result Value Ref Range Status   Specimen Description SPUTUM  Final   Special Requests NONE Reflexed from [id number]  Final   Gram Stain   Final    MODERATE WBC PRESENT,BOTH PMN AND MONONUCLEAR RARE GRAM POSITIVE COCCI IN PAIRS    Culture   Final    NO GROWTH < 12 HOURS Performed at [facility], [place] [address], [place], [place] [address]    Report Status PENDING  Incomplete  Radiology Studies: VAS [place] LOWER EXTREMITY VENOUS (DVT)  Result Date: 10/15/2022  Lower Venous DVT Study Patient Name:  LUNETTA MALISZEWSKI Lindor  Date of Exam:   10/15/2022 Medical Rec #: [id number]          Accession #:    [id number] Date of Birth: 1990-12-22         Patient Gender: F Patient Age:   38 years Exam Location:  [facility] Procedure:      VAS [place] LOWER EXTREMITY VENOUS (DVT) Referring Phys: Fuller Plan --------------------------------------------------------------------------------  Indications: SOB.  Comparison Study: No prior study on file Performing Technologist: Sharion Dove RVS  Examination Guidelines: A complete evaluation includes B-mode imaging, spectral Doppler, color Doppler, and power Doppler as needed of all accessible portions of each vessel. Bilateral testing is considered an integral part of a complete examination. Limited examinations for reoccurring indications may be performed as noted. The reflux portion of the exam is performed with the patient in reverse Trendelenburg.  +---------+---------------+---------+-----------+----------+--------------+ RIGHT    CompressibilityPhasicitySpontaneityPropertiesThrombus Aging +---------+---------------+---------+-----------+----------+--------------+ CFV      Full           Yes      Yes                                 +---------+---------------+---------+-----------+----------+--------------+ SFJ      Full                                                         +---------+---------------+---------+-----------+----------+--------------+ FV Prox  Full                                                        +---------+---------------+---------+-----------+----------+--------------+ FV Mid   Full                                                        +---------+---------------+---------+-----------+----------+--------------+ FV DistalFull                                                        +---------+---------------+---------+-----------+----------+--------------+ PFV      Full                                                        +---------+---------------+---------+-----------+----------+--------------+ POP      Full           Yes      Yes                                 +---------+---------------+---------+-----------+----------+--------------+ PTV  Full                                                        +---------+---------------+---------+-----------+----------+--------------+ PERO     Full                                                        +---------+---------------+---------+-----------+----------+--------------+   +---------+---------------+---------+-----------+----------+--------------+ LEFT     CompressibilityPhasicitySpontaneityPropertiesThrombus Aging +---------+---------------+---------+-----------+----------+--------------+ CFV      Full           Yes      Yes                                 +---------+---------------+---------+-----------+----------+--------------+ SFJ      Full                                                        +---------+---------------+---------+-----------+----------+--------------+ FV Prox  Full                                                        +---------+---------------+---------+-----------+----------+--------------+ FV Mid   Full                                                         +---------+---------------+---------+-----------+----------+--------------+ FV DistalFull                                                        +---------+---------------+---------+-----------+----------+--------------+ PFV      Full                                                        +---------+---------------+---------+-----------+----------+--------------+ POP      Full           Yes      Yes                                 +---------+---------------+---------+-----------+----------+--------------+ PTV      Full                                                        +---------+---------------+---------+-----------+----------+--------------+  PERO     Full                                                        +---------+---------------+---------+-----------+----------+--------------+     Summary: BILATERAL: - No evidence of deep vein thrombosis seen in the lower extremities, bilaterally. -No evidence of popliteal cyst, bilaterally.   *See table(s) above for measurements and observations. Electronically signed by Deitra Mayo MD on 10/15/2022 at 6:50:33 PM.    Final         Scheduled Meds:  enoxaparin (LOVENOX) injection  40 mg Subcutaneous Q24H   fluconazole  200 mg Oral Once   gabapentin  300 mg Oral BID   guaiFENesin  600 mg Oral BID   ipratropium-albuterol  3 mL Nebulization QID   miconazole nitrate   Topical BID   sodium chloride flush  3 mL Intravenous Q12H   Continuous Infusions:  ceFEPime (MAXIPIME) IV 2 g (10/16/22 0543)   vancomycin 1,500 mg (10/16/22 0108)      LOS: 2 days    Time spent: 35 minutes    Barb Merino, MD [facility] Pager [phone]

## 2022-10-17 ENCOUNTER — Inpatient Hospital Stay (HOSPITAL_COMMUNITY): Payer: Medicaid Other

## 2022-10-17 DIAGNOSIS — R6510 Systemic inflammatory response syndrome (SIRS) of non-infectious origin without acute organ dysfunction: Secondary | ICD-10-CM | POA: Diagnosis not present

## 2022-10-17 DIAGNOSIS — R0609 Other forms of dyspnea: Secondary | ICD-10-CM

## 2022-10-17 HISTORY — PX: IR THORACENTESIS ASP PLEURAL SPACE W/IMG GUIDE: IMG5380

## 2022-10-17 LAB — EXPECTORATED SPUTUM ASSESSMENT W GRAM STAIN, RFLX TO RESP C

## 2022-10-17 LAB — CBC
HCT: 27 % — ABNORMAL LOW (ref 36.0–46.0)
Hemoglobin: 8.3 g/dL — ABNORMAL LOW (ref 12.0–15.0)
MCH: 17.9 pg — ABNORMAL LOW (ref 26.0–34.0)
MCHC: 30.7 g/dL (ref 30.0–36.0)
MCV: 58.3 fL — ABNORMAL LOW (ref 80.0–100.0)
Platelets: 263 10*3/uL (ref 150–400)
RBC: 4.63 MIL/uL (ref 3.87–5.11)
RDW: 20.6 % — ABNORMAL HIGH (ref 11.5–15.5)
WBC: 15.5 10*3/uL — ABNORMAL HIGH (ref 4.0–10.5)
nRBC: 0.5 % — ABNORMAL HIGH (ref 0.0–0.2)

## 2022-10-17 LAB — COMPREHENSIVE METABOLIC PANEL
ALT: 23 U/L (ref 0–44)
AST: 27 U/L (ref 15–41)
Albumin: 2.5 g/dL — ABNORMAL LOW (ref 3.5–5.0)
Alkaline Phosphatase: 88 U/L (ref 38–126)
Anion gap: 14 (ref 5–15)
BUN: 6 mg/dL (ref 6–20)
CO2: 23 mmol/L (ref 22–32)
Calcium: 8.8 mg/dL — ABNORMAL LOW (ref 8.9–10.3)
Chloride: 99 mmol/L (ref 98–111)
Creatinine, Ser: 0.65 mg/dL (ref 0.44–1.00)
GFR, Estimated: >60 mL/min (ref >60)
Glucose, Bld: 121 mg/dL — ABNORMAL HIGH (ref 70–99)
Potassium: 3.8 mmol/L (ref 3.5–5.1)
Sodium: 136 mmol/L (ref 135–145)
Total Bilirubin: 0.7 mg/dL (ref 0.3–1.2)
Total Protein: 7.2 g/dL (ref 6.5–8.1)

## 2022-10-17 LAB — CBC WITH DIFFERENTIAL/PLATELET
Abs Immature Granulocytes: 0.12 10*3/uL — ABNORMAL HIGH (ref 0.00–0.07)
Basophils Absolute: 0.1 10*3/uL (ref 0.0–0.1)
Basophils Relative: 0 %
Eosinophils Absolute: 0.1 10*3/uL (ref 0.0–0.5)
Eosinophils Relative: 1 %
HCT: 25 % — ABNORMAL LOW (ref 36.0–46.0)
Hemoglobin: 7.8 g/dL — ABNORMAL LOW (ref 12.0–15.0)
Immature Granulocytes: 1 %
Lymphocytes Relative: 10 %
Lymphs Abs: 1.9 10*3/uL (ref 0.7–4.0)
MCH: 18.1 pg — ABNORMAL LOW (ref 26.0–34.0)
MCHC: 31.2 g/dL (ref 30.0–36.0)
MCV: 58.1 fL — ABNORMAL LOW (ref 80.0–100.0)
Monocytes Absolute: 1.3 10*3/uL — ABNORMAL HIGH (ref 0.1–1.0)
Monocytes Relative: 7 %
Neutro Abs: 16.1 10*3/uL — ABNORMAL HIGH (ref 1.7–7.7)
Neutrophils Relative %: 81 %
Platelets: 291 10*3/uL (ref 150–400)
RBC: 4.3 MIL/uL (ref 3.87–5.11)
RDW: 20.4 % — ABNORMAL HIGH (ref 11.5–15.5)
WBC: 19.6 10*3/uL — ABNORMAL HIGH (ref 4.0–10.5)
nRBC: 0.3 % — ABNORMAL HIGH (ref 0.0–0.2)

## 2022-10-17 LAB — LACTATE DEHYDROGENASE, PLEURAL OR PERITONEAL FLUID: LD, Fluid: 661 U/L — ABNORMAL HIGH (ref 3–23)

## 2022-10-17 LAB — GLUCOSE, PLEURAL OR PERITONEAL FLUID: Glucose, Fluid: 26 mg/dL

## 2022-10-17 LAB — BODY FLUID CELL COUNT WITH DIFFERENTIAL
Eos, Fluid: 0 %
Lymphs, Fluid: 12 %
Monocyte-Macrophage-Serous Fluid: 1 % — ABNORMAL LOW (ref 50–90)
Neutrophil Count, Fluid: 87 % — ABNORMAL HIGH (ref 0–25)
Total Nucleated Cell Count, Fluid: 710 cu mm (ref 0–1000)

## 2022-10-17 LAB — ALBUMIN, PLEURAL OR PERITONEAL FLUID: Albumin, Fluid: 2.4 g/dL

## 2022-10-17 LAB — PATHOLOGIST SMEAR REVIEW: Path Review: REACTIVE

## 2022-10-17 LAB — VANCOMYCIN, PEAK: Vancomycin Pk: 16 ug/mL — ABNORMAL LOW (ref 30–40)

## 2022-10-17 MED ORDER — LIDOCAINE HCL 1 % IJ SOLN
INTRAMUSCULAR | Status: AC
  Administered 2022-10-17: 12 mL
  Filled 2022-10-17: qty 20

## 2022-10-17 MED ORDER — KETOROLAC TROMETHAMINE 30 MG/ML IJ SOLN
30.0000 mg | Freq: Four times a day (QID) | INTRAMUSCULAR | Status: AC
  Administered 2022-10-17 – 2022-10-19 (×8): 30 mg via INTRAVENOUS
  Filled 2022-10-17 (×8): qty 1

## 2022-10-17 MED ORDER — HYDROMORPHONE HCL 1 MG/ML IJ SOLN
1.0000 mg | INTRAMUSCULAR | Status: DC | PRN
  Administered 2022-10-17 – 2022-10-19 (×9): 1 mg via INTRAVENOUS
  Filled 2022-10-17 (×10): qty 1

## 2022-10-17 MED ORDER — LIDOCAINE HCL 1 % IJ SOLN
INTRAMUSCULAR | Status: AC
  Filled 2022-10-17: qty 20

## 2022-10-17 NOTE — Progress Notes (Signed)
Physical Therapy Treatment Patient Details Name: Julie King MRN: [id number] DOB: Jan 17, 1991 Today's Date: 10/17/2022   History of Present Illness 32 year old with recently diagnosed lingular mass, robotic biopsy 06/2022 negative for malignancy, repeat CT scans with increasing size and hemoptysis. 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   Admitted due to L side chest pain, fever, cough and orthopnea found to have L pleural effusion.    PT Comments    Pt received sitting EOB, agreeable to therapy session and with fair participation and poor tolerance for functional mobility tasks. Pt limited due to c/o fatigue and dyspnea, HR 130-138 bpm with bed mobility (return to supine) and SpO2 desat on RA when pt doffed [place] briefly (<2 mins, pt desat to 83% and improved to >90% within 30 seconds). Pt encouraged to try stand/squat pivot OOB to recliner chair but she defers due to fatigue. Pt Supervision level for bed mobility with use of rail/bed features and HOB elevated. Reviewed IS with pt, she achieved 200-500 mL, encouraged per to perform hourly. Pt continues to benefit from PT services to progress toward functional mobility goals.    Recommendations for follow up therapy are one component of a multi-disciplinary discharge planning process, led by the attending physician.  Recommendations may be updated based on patient status, additional functional criteria and insurance authorization.  Follow Up Recommendations  Home health PT     Assistance Recommended at Discharge Intermittent Supervision/Assistance  Patient can return home with the following A little help with walking and/or transfers;Assistance with cooking/housework;Assist for transportation;Help with stairs or ramp for entrance;A little help with bathing/dressing/bathroom   Equipment Recommendations  None recommended by PT    Recommendations for Other Services       Precautions / Restrictions  Precautions Precautions: Fall Precaution Comments: watch HR, SpO2, RR Restrictions Weight Bearing Restrictions: No     Mobility  Bed Mobility Overal bed mobility: Needs Assistance Bed Mobility: Sit to Supine       Sit to supine: Supervision   General bed mobility comments: increased time, HOB elevated, PTA assist with lines and cues for pursed-lip breathing    Transfers                   General transfer comment: Offered to assist pt to squat or stand pivot OOB to chair but she defers, returned to supine from sitting EOB    Ambulation/Gait               General Gait Details: defer due to tachycardia/elevated RR sitting EOB at rest       Balance Overall balance assessment: Needs assistance Sitting-balance support: Feet supported Sitting balance-Leahy Scale: Good Sitting balance - Comments: static sitting and weight shifting no LOB       Standing balance comment: pt defers                            Cognition Arousal/Alertness: Awake/alert Behavior During Therapy: Anxious, Restless Overall Cognitive Status: Impaired/Different from baseline Area of Impairment: Orientation, Attention, Memory, Following commands, Safety/judgement, Awareness, Problem solving                 Orientation Level: Time Current Attention Level: Selective Memory: Decreased short-term memory Following Commands: Follows one step commands with increased time, Follows multi-step commands with increased time   Awareness: Emergent Problem Solving: Slow processing, Decreased initiation, Requires verbal cues, Requires tactile cues General Comments: Pt received  sitting EOB, expressed surprise when PTA told her it is Monday, pt thought it was Friday. Pt anxious due to elevated HR and defers standing trials, agreeable to limited session seated EOB and return to supine.        Exercises Other Exercises Other Exercises: using incentive x6 reps, cues for stopping to  rest and recover after each rep, able to pull ~450 mL x1 otherwise mostly ~200-300 mL. Encouraged her to perform hourly x5 reps with rest break between reps. Other Exercises: seated BLE AROM: LAQ x 10 reps ea    General Comments General comments (skin integrity, edema, etc.): HR 130-138 bpm sitting EOB and with return to supine from sitting. SpO2 >92% on 4L O2 [place] but desat when pt self-doffs [place] to wipe her nose, down to 84%. Improves once pt replaces [place] within 30 seconds to >91%. Pt reports no improvement to pain or pressure since thoracentesis performed. Pt given wipe from purewick packet and new purewick and was able to perform her own peri-care and to don new purewick once in supine (pt defers PTA to assist her with this or double check it is in place).      Pertinent Vitals/Pain Pain Assessment Pain Assessment: Faces Faces Pain Scale: Hurts even more Pain Location: L chest and back Pain Descriptors / Indicators: Discomfort, Grimacing Pain Intervention(s): Monitored during session, Repositioned, Limited activity within patient's tolerance     PT Goals (current goals can now be found in the care plan section) Acute Rehab PT Goals Patient Stated Goal: to breathe better PT Goal Formulation: With patient Time For Goal Achievement: 10/28/22 Progress towards PT goals: Progressing toward goals    Frequency    Min 3X/week      PT Plan Current plan remains appropriate       AM-PAC PT "6 Clicks" Mobility   Outcome Measure  Help needed turning from your back to your side while in a flat bed without using bedrails?: None Help needed moving from lying on your back to sitting on the side of a flat bed without using bedrails?: A Little Help needed moving to and from a bed to a chair (including a wheelchair)?: A Little Help needed standing up from a chair using your arms (e.g., wheelchair or bedside chair)?: A Little Help needed to walk in hospital room?: Total Help needed climbing 3-5  steps with a railing? : Total 6 Click Score: 15    End of Session Equipment Utilized During Treatment: Oxygen Activity Tolerance: Patient limited by fatigue;Treatment limited secondary to medical complications (Comment);Other (comment) (tachycardia and tachypnea limiting session along with pt fatigue) Patient left: in bed;with call bell/phone within reach;with bed alarm set;Other (comment) (lab technician entering room; pt requesting all 4 rails up) Nurse Communication: Mobility status PT Visit Diagnosis: Muscle weakness (generalized) (M62.81);Difficulty in walking, not elsewhere classified (R26.2)     Time: [id number] PT Time Calculation (min) (ACUTE ONLY): 15 min  Charges:  $Therapeutic Activity: 8-22 mins                     Elise Knobloch P., PTA Acute Rehabilitation Services Secure Chat Preferred 9a-5:30pm Office: [place] 10/17/2022, 5:49 PM

## 2022-10-17 NOTE — Progress Notes (Signed)
   NAME:  HALAINA SALER, MRN:  [id number], DOB:  1991/01/18, LOS: 3 ADMISSION DATE:  10/14/2022, CONSULTATION DATE:  10/14/22 REFERRING MD:  Tamala Julian CHIEF COMPLAINT:  Dyspnea   History of Present Illness:  Julie King is a 32 y.o. female who is known to our practice for lingular mass. She has had robotic biopsy at [facility] 07/05/22 which was negative for malignancy. She had repeat CT scans that showed increase in size and also developed hemoptysis; therefore, she was seen at [facility] in December 2023. She underwent bronch and biopsy again with similar results of no malignancy, just inflammatory cells. She was then referred to [facility] and ultimately had VATS with wedge resection of lingular lung mass on 2/8 (Dr. Roxan Hockey). Pathology was negative for malignancy and showed inflammation with focal necrosis, fibrosis, and pneumocyte hyperplasia. She initially did well but then around 2/14, began to experience worsening chest pain that radiated into her back along with subjective fevers, cough productive of gray-black sputum, nausea, orthopnea. She has had no drainage from her surgical site.  She presented to [facility] ED 2/15 with above symptoms. She had CTA that was negative for PE but showed post surgical changes and small to moderate L pleural effusion. EKG and troponins were negative. She was admitted by [facility] and was started on Ceftriaxone and Azithromycin.  She was seen by IR for possible thoracentesis but due to combination of increased WOB and small effusion, this was deferred.   Afternoon of 2/16, she had ongoing dyspnea despite O2 sat 98% on room air. PCCM called to see in consultation.  Pertinent  Medical History:  has Headache; Morbidly obese ([place]); History of atrial fibrillation; Essential hypertension; Family history of breast cancer; Hemoptysis; Lung mass; Mass of left lung; SIRS (systemic inflammatory response syndrome) ([place]); Healthcare-associated pneumonia; Pleural effusion; Microcytic hypochromic  anemia; Tobacco abuse; Chest pain; and Pneumonia on their problem list.  Significant Hospital Events: Including procedures, antibiotic start and stop dates in addition to other pertinent events   2/16 admit, PCCM consult  Interim History / Subjective:   Febrile to 101 yesterday. WBC stable. Went to IR had thora today.  Objective:  Blood pressure (!) 145/83, pulse (!) 138, temperature 99.6 F (37.6 C), temperature source Oral, resp. rate (!) 29, height 5' 7"$  (1.702 m), weight (!) 145 kg, last menstrual period 09/30/2022, SpO2 95 %.        Intake/Output Summary (Last 24 hours) at 10/17/2022 1557 Last data filed at 10/17/2022 0325 Gross per 24 hour  Intake --  Output 400 ml  Net -400 ml     Filed Weights   10/14/22 0152  Weight: (!) 145 kg    Examination: Deferred as in procedure  Labs/imaging personally reviewed:  CXR Appears worse, effusion larger  Assessment & Plan:  Loculated pleural effusion: With recent VATS, concern for hemothorax versus infection, high suspicion for infected  space given appearance as well as strep on lung cultures from resection, suspect infection was resected. -- Recommend [facility] consult, likely benefit from pleural drain/chest tube but defer to their expertise --Happy to assist with tube management if desired   Signature:    Lanier Clam, MD [facility] See Amion for contact info  If no response to pager , please call [phone] until 7pm After 7:00 pm call Elink  [phone] 10/17/2022, 3:57 PM

## 2022-10-17 NOTE — Progress Notes (Signed)
Asked to visit pt by patient's RN, T'Eil, to discuss her concerns with the NT. Pt wanted to advise that she felt purewick was improperly placed by the NT. Advised patient that I would change NT assignment for the remainder of this evening and would report her concerns to department leadership. She was satisfied with these actions at this time. New NT assigned to patient's care for this shift.
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
## 2022-10-17 NOTE — Progress Notes (Signed)
PT Cancellation Note  Patient Details Name: Julie King MRN: [id number] DOB: 06-19-1991   Cancelled Treatment:    Reason Eval/Treat Not Completed: (P) Patient at procedure or test/unavailable (pt off unit at IR for procedure.) Will continue efforts per PT plan of care as schedule permits.   Milana Salay M Hoyt Leanos 10/17/2022, 11:59 AM

## 2022-10-17 NOTE — Progress Notes (Signed)
      [address]       [place] [address]             [phone]      Pt well known form recent wedge resection for a lung abscess- no malignancy  Presented to ED with worsening pain on Friday- no PE but small effusion.  Returned on Saturday with pain again worsening and repeat Ct showed effusion slightly larger, still no PE.  Febrile and WBC elevated.  Admitted and started on IV antibiotics with Vancomycin and Cefepime.  Had a thoracentesis today which drained 500 ml of clear yellow fluid.  Unfortunately a drain was not placed which needs to be done.  Still tachycardic and has significant pain.    CXR slightly improved post thoracentesis but still a relatively large effusion  Recommend Continue IV antibiotics Ask IR to place a drain in the effusion. May ultimately require VATS to drain but would try pigtail first  [place]. Roxan Hockey, MD [facility] [phone]

## 2022-10-17 NOTE — Progress Notes (Signed)
PROGRESS NOTE    LORALYNN VOKES  [id number] DOB: 1991/01/26 DOA: 10/14/2022 PCP: Hattie Perch, MD    Brief Narrative:  32 year old with recently diagnosed lingular mass, robotic biopsy 06/2022 negative for malignancy, repeat CT scans with increasing size and hemoptysis.  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  Pathology negative for malignancy and showed inflammation with focal necrosis, fibrosis and pneumocyte hyperplasia.  Discharge home after surgery.  Presents back to the emergency room with 3 days of worsening left-sided chest pain, subjective fever, cough, nausea and orthopnea.  CT angiogram with postsurgical changes small left pleural effusion. Remains in the hospital.  Persistently febrile. 2/19, repeat chest x-ray with increased effusion on the left side probably amenable to diagnostic and therapeutic thoracentesis.   Assessment & Plan:   Left lower lobe pneumonia, complicated parapneumonic effusion with pleurisy, Postoperative pleural space infection. Sepsis present on admission.  She is postoperative and instrumentation on the left lungs.  On broad antibiotic coverage with azithromycin, vancomycin and cefepime.  Continues to spike fever and has leukocytosis. Continue aggressive chest physiotherapy, incentive spirometry, deep breathing exercises, sputum induction, mucolytic's and bronchodilators. Sputum cultures 2/17, rare gram-positive cocci. Blood cultures, negative so far. Surgical culture from 2/8 with Streptococcus. Supplemental oxygen to keep saturations more than 90%. Adequate pain medications, mobilize. Appreciate pulmonary follow-up. CT angiogram negative for PE, DVT studies negative. CT scan on 2/16 with left-sided pleural effusion, patient with too much motion and discomfort unable to drain. Chest x-ray 2/19 with worsening left-sided pleural effusion.  IR consulted for thoracentesis. Will send fluid studies, if obviously  infected, may need CT VS to do VATS again. CT scan was planned, since chest x-ray shows large pleural effusion, may benefit during CT scan after procedure.  Abnormal urine: Check urine culture.  1 dose of Diflucan.  Morbid obesity with BMI more than 50: Definitely she will benefit with weight loss and lifestyle modification.  Will continue to have discussion.  CT scan of the chest was delayed, I repeated a chest x-ray that shows large pleural effusion.  Explained to the patient as most possible that draining that left side of the lungs may help her [place] to make a diagnosis, symptom relief as well as to see if she has any abscess there. I told patient that thoracentesis will benefit more than CT scan now, we can do CT scan later after the drainage. Appreciate IR team to explain patient to come to thoracentesis.    DVT prophylaxis: enoxaparin (LOVENOX) injection 40 mg Start: 10/14/22 1115   Code Status: Full code Family Communication: None at the bedside.  Mother on the phone. Disposition Plan: Status is: Inpatient Remains inpatient appropriate because: Severe pleuritic chest pain, IV antibiotics.  Inpatient procedures planned.     Consultants:  Pulmonary CT surgery, will notify Dr Roxan Hockey  Procedures:  None  Antimicrobials:  Rocephin azithromycin 2/16--- Azithromycin, vancomycin and cefepime 2/17--   Subjective:  Patient seen and examined.  Overnight intermittent severe pain.  Still not taking deep breaths.  Remains tachycardic.  Low-grade fever 101.2 overnight. Chest x-ray ordered and reviewed, shown to the patient with diagrams.   Objective: Vitals:   10/16/22 2035 10/17/22 0106 10/17/22 0353 10/17/22 0755  BP: 118/63 (!) 112/93 (!) 158/71 (!) 140/85  Pulse: (!) 136 (!) 119 (!) 134 (!) 138  Resp: (!) 44 (!) 34 (!) 29 (!) 29  Temp: (!) 101.2 F (38.4 C) 99 F (37.2 C) 100 F (37.8 C) 99.6  F (37.6 C)  TempSrc: Oral Oral Oral Oral  SpO2: 95% 98% 93% 95%  Weight:       Height:        Intake/Output Summary (Last 24 hours) at 10/17/2022 1050 Last data filed at 10/17/2022 0325 Gross per 24 hour  Intake --  Output 400 ml  Net -400 ml   Filed Weights   10/14/22 0152  Weight: (!) 145 kg    Examination:  General exam: Anxious.  In moderate distress. Respiratory system: Poor inspiratory effort.  Poor air entry at the left side.  Incisions are clean and dry. SpO2: 95 % O2 Flow Rate (L/min): 5 L/min  Cardiovascular system: S1 & S2 heard, RRR.  Tachycardic.   Gastrointestinal system: Abdomen is nondistended, soft and nontender. No organomegaly or masses felt. Normal bowel sounds heard.  Obese and pendulous. Central nervous system: Alert and oriented. No focal neurological deficits. Extremities: Symmetric 5 x 5 power. Skin: No rashes, lesions or ulcers    Data Reviewed: I have personally reviewed following labs and imaging studies  CBC: Recent Labs  Lab 10/13/22 1459 10/14/22 0229 10/15/22 0116 10/16/22 0116 10/17/22 0939  WBC 15.0* 15.9* 22.5* 21.2* 19.6*  NEUTROABS  --  12.7*  --  17.1* 16.1*  HGB 9.6* 10.4* 9.5* 8.7* 7.8*  HCT 32.7* 34.5* 31.3* 28.2* 25.0*  MCV 60.2* 59.6* 59.3* 58.8* 58.1*  PLT 286 299 274 269 [id number]   Basic Metabolic Panel: Recent Labs  Lab 10/13/22 1459 10/14/22 0229 10/15/22 0116 10/16/22 0116 10/17/22 0939  NA 135 134* 134* 133* 136  K 4.3 4.2 4.2 4.3 3.8  CL 99 99 99 99 99  CO2 26 21* 25 23 23  $ GLUCOSE 89 114* 144* 119* 121*  BUN 9 11 9 8 6  $ CREATININE 0.71 0.77 0.81 0.63 0.65  CALCIUM 9.0 9.1 8.8* 8.6* 8.8*  MG  --   --   --  1.9  --    GFR: Estimated Creatinine Clearance: 152.8 mL/min (by C-G formula based on SCr of 0.65 mg/dL). Liver Function Tests: Recent Labs  Lab 10/14/22 0850 10/16/22 0116 10/17/22 0939  AST 19 17 27  $ ALT 18 18 23  $ ALKPHOS 77 81 88  BILITOT 0.6 0.6 0.7  PROT 7.2 7.1 7.2  ALBUMIN 3.1* 2.8* 2.5*   No results for input(s): "LIPASE", "AMYLASE" in the last 168  hours. No results for input(s): "AMMONIA" in the last 168 hours. Coagulation Profile: No results for input(s): "INR", "PROTIME" in the last 168 hours. Cardiac Enzymes: No results for input(s): "CKTOTAL", "CKMB", "CKMBINDEX", "TROPONINI" in the last 168 hours. BNP (last 3 results) No results for input(s): "PROBNP" in the last 8760 hours. HbA1C: No results for input(s): "HGBA1C" in the last 72 hours. CBG: No results for input(s): "GLUCAP" in the last 168 hours. Lipid Profile: No results for input(s): "CHOL", "HDL", "LDLCALC", "TRIG", "CHOLHDL", "LDLDIRECT" in the last 72 hours. Thyroid Function Tests: No results for input(s): "TSH", "T4TOTAL", "FREET4", "T3FREE", "THYROIDAB" in the last 72 hours. Anemia Panel: No results for input(s): "VITAMINB12", "FOLATE", "FERRITIN", "TIBC", "IRON", "RETICCTPCT" in the last 72 hours. Sepsis Labs: Recent Labs  Lab 10/14/22 0912 10/14/22 1709 10/15/22 0116  PROCALCITON  --  <0.10 0.15  LATICACIDVEN 1.1 0.9  --     Recent Results (from the past 240 hour(s))  Resp panel by RT-PCR (RSV, Flu A&B, Covid) Anterior Nasal Swab     Status: None   Collection Time: 10/14/22  3:32 AM   Specimen: Anterior Nasal  Swab  Result Value Ref Range Status   SARS Coronavirus 2 by RT PCR NEGATIVE NEGATIVE Final   Influenza A by PCR NEGATIVE NEGATIVE Final   Influenza B by PCR NEGATIVE NEGATIVE Final    Comment: (NOTE) The Xpert Xpress SARS-CoV-2/FLU/RSV plus assay is intended as an aid in the diagnosis of influenza from Nasopharyngeal swab specimens and should not be used as a sole basis for treatment. Nasal washings and aspirates are unacceptable for Xpert Xpress SARS-CoV-2/FLU/RSV testing.  Fact Sheet for Patients: EntrepreneurPulse.com.au  Fact Sheet for Healthcare Providers: IncredibleEmployment.be  This test is not yet approved or cleared by the [place] FDA and has been authorized for detection and/or diagnosis  of SARS-CoV-2 by FDA under an Emergency Use Authorization (EUA). This EUA will remain in effect (meaning this test can be used) for the duration of the COVID-19 declaration under Section 564(b)(1) of the Act, 21 U.S.C. section 360bbb-3(b)(1), unless the authorization is terminated or revoked.     Resp Syncytial Virus by PCR NEGATIVE NEGATIVE Final    Comment: (NOTE) Fact Sheet for Patients: EntrepreneurPulse.com.au  Fact Sheet for Healthcare Providers: IncredibleEmployment.be  This test is not yet approved or cleared by the [place] FDA and has been authorized for detection and/or diagnosis of SARS-CoV-2 by FDA under an Emergency Use Authorization (EUA). This EUA will remain in effect (meaning this test can be used) for the duration of the COVID-19 declaration under Section 564(b)(1) of the Act, 21 U.S.C. section 360bbb-3(b)(1), unless the authorization is terminated or revoked.  Performed at [facility], [place] [address] [place], [place] [address]   Blood culture (routine x 2)     Status: None (Preliminary result)   Collection Time: 10/14/22  7:11 AM   Specimen: BLOOD  Result Value Ref Range Status   Specimen Description BLOOD RIGHT ANTECUBITAL  Final   Special Requests   Final    BOTTLES DRAWN AEROBIC AND ANAEROBIC Blood Culture results may not be optimal due to an inadequate volume of blood received in culture bottles   Culture   Final    NO GROWTH 3 DAYS Performed at [facility], [place] [address] [place], [place] [address]    Report Status PENDING  Incomplete  Blood culture (routine x 2)     Status: None (Preliminary result)   Collection Time: 10/14/22  5:09 PM   Specimen: BLOOD LEFT HAND  Result Value Ref Range Status   Specimen Description BLOOD LEFT HAND  Final   Special Requests   Final    BOTTLES DRAWN AEROBIC AND ANAEROBIC Blood Culture adequate volume   Culture   Final    NO GROWTH 3 DAYS Performed at [facility], [place] [address], [place], [place] [address]    Report Status PENDING  Incomplete  MRSA Next Gen by PCR, Nasal     Status: None   Collection Time: 10/15/22  8:32 AM   Specimen: Nasal Mucosa; Nasal Swab  Result Value Ref Range Status   MRSA by PCR Next Gen NOT DETECTED NOT DETECTED Final    Comment: (NOTE) The GeneXpert MRSA Assay (FDA approved for NASAL specimens only), is one component of a comprehensive MRSA colonization surveillance program. It is not intended to diagnose MRSA infection nor to guide or monitor treatment for MRSA infections. Test performance is not FDA approved in patients less than 12 years old. Performed at [facility], [place] [address] [place], [place] [address]   Expectorated Sputum  Assessment w Gram Stain, Rflx to Resp Cult     Status: None (Preliminary result)   Collection Time: 10/15/22  8:40 PM   Specimen: Sputum  Result Value Ref Range Status   Specimen Description SPUTUM  Final   Special Requests NONE  Final   Sputum evaluation   Final    THIS SPECIMEN IS ACCEPTABLE FOR SPUTUM CULTURE Performed at [facility], [address], [place], [place] [address]    Report Status PENDING  Incomplete  Culture, Respiratory w Gram Stain     Status: None (Preliminary result)   Collection Time: 10/15/22  8:40 PM   Specimen: SPU  Result Value Ref Range Status   Specimen Description SPUTUM  Final   Special Requests NONE Reflexed from [id number]  Final   Gram Stain   Final    MODERATE WBC PRESENT,BOTH PMN AND MONONUCLEAR RARE GRAM POSITIVE COCCI IN PAIRS    Culture   Final    NO GROWTH < 12 HOURS Performed at [facility], [place] [address] [place], [place] [address]    Report Status PENDING  Incomplete         Radiology Studies: DG CHEST PORT 1 VIEW  Result Date: 10/17/2022 CLINICAL DATA:  Pneumonia EXAM: PORTABLE CHEST 1 VIEW COMPARISON:  10/14/2022 FINDINGS: Stable heart size. Large left pleural effusion, increased. Worsening  bibasilar airspace opacities. Probable small right pleural effusion. No pneumothorax. IMPRESSION: 1. Large left pleural effusion, increased. 2. Worsening bibasilar airspace opacities. Electronically Signed   By: Davina Poke D.O.   On: 10/17/2022 08:46   VAS [place] LOWER EXTREMITY VENOUS (DVT)  Result Date: 10/15/2022  Lower Venous DVT Study Patient Name:  NOTNAMED PAYNE Riel  Date of Exam:   10/15/2022 Medical Rec #: [id number]          Accession #:    [id number] Date of Birth: 01/20/1991         Patient Gender: F Patient Age:   34 years Exam Location:  [facility] Procedure:      VAS [place] LOWER EXTREMITY VENOUS (DVT) Referring Phys: Fuller Plan --------------------------------------------------------------------------------  Indications: SOB.  Comparison Study: No prior study on file Performing Technologist: Sharion Dove RVS  Examination Guidelines: A complete evaluation includes B-mode imaging, spectral Doppler, color Doppler, and power Doppler as needed of all accessible portions of each vessel. Bilateral testing is considered an integral part of a complete examination. Limited examinations for reoccurring indications may be performed as noted. The reflux portion of the exam is performed with the patient in reverse Trendelenburg.  +---------+---------------+---------+-----------+----------+--------------+ RIGHT    CompressibilityPhasicitySpontaneityPropertiesThrombus Aging +---------+---------------+---------+-----------+----------+--------------+ CFV      Full           Yes      Yes                                 +---------+---------------+---------+-----------+----------+--------------+ SFJ      Full                                                        +---------+---------------+---------+-----------+----------+--------------+ FV Prox  Full                                                        +---------+---------------+---------+-----------+----------+--------------+  FV Mid   Full                                                        +---------+---------------+---------+-----------+----------+--------------+ FV DistalFull                                                        +---------+---------------+---------+-----------+----------+--------------+ PFV      Full                                                        +---------+---------------+---------+-----------+----------+--------------+ POP      Full           Yes      Yes                                 +---------+---------------+---------+-----------+----------+--------------+ PTV      Full                                                        +---------+---------------+---------+-----------+----------+--------------+ PERO     Full                                                        +---------+---------------+---------+-----------+----------+--------------+   +---------+---------------+---------+-----------+----------+--------------+ LEFT     CompressibilityPhasicitySpontaneityPropertiesThrombus Aging +---------+---------------+---------+-----------+----------+--------------+ CFV      Full           Yes      Yes                                 +---------+---------------+---------+-----------+----------+--------------+ SFJ      Full                                                        +---------+---------------+---------+-----------+----------+--------------+ FV Prox  Full                                                        +---------+---------------+---------+-----------+----------+--------------+ FV Mid   Full                                                        +---------+---------------+---------+-----------+----------+--------------+  FV DistalFull                                                        +---------+---------------+---------+-----------+----------+--------------+ PFV      Full                                                         +---------+---------------+---------+-----------+----------+--------------+ POP      Full           Yes      Yes                                 +---------+---------------+---------+-----------+----------+--------------+ PTV      Full                                                        +---------+---------------+---------+-----------+----------+--------------+ PERO     Full                                                        +---------+---------------+---------+-----------+----------+--------------+     Summary: BILATERAL: - No evidence of deep vein thrombosis seen in the lower extremities, bilaterally. -No evidence of popliteal cyst, bilaterally.   *See table(s) above for measurements and observations. Electronically signed by Deitra Mayo MD on 10/15/2022 at 6:50:33 PM.    Final         Scheduled Meds:  enoxaparin (LOVENOX) injection  40 mg Subcutaneous Q24H   gabapentin  300 mg Oral BID   guaiFENesin  600 mg Oral BID   miconazole nitrate   Topical BID   sodium chloride flush  3 mL Intravenous Q12H   Continuous Infusions:  ceFEPime (MAXIPIME) IV 2 g (10/17/22 0532)   vancomycin 1,500 mg (10/17/22 0217)      LOS: 3 days    Time spent: 35 minutes    Barb Merino, MD [facility] Pager [phone]

## 2022-10-17 NOTE — Progress Notes (Signed)
Occupational Therapy Treatment Patient Details Name: Julie King MRN: [id number] DOB: 1990-08-30 Today's Date: 10/17/2022   History of present illness 32 year old with recently diagnosed lingular mass, robotic biopsy 06/2022 negative for malignancy, repeat CT scans with increasing size and hemoptysis.  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   Admitted due to L side chest pain, fever, cough and orthopnea found to have L pleural effusion.   OT comments  Pt with slow progression towards goals this session needing supervision for bed mobility, and set up -mod A for seated UB dressing and grooming task. Pt able to sit EOB x12mn for ADLs, HR in 130's and pt lethargic, frequently closing eyes and needing increased cues to remain attentive to task. Pt presenting with impairments listed below, will follow acutely. Continue to recommend HHOT at d/c pending progression.   Recommendations for follow up therapy are one component of a multi-disciplinary discharge planning process, led by the attending physician.  Recommendations may be updated based on patient status, additional functional criteria and insurance authorization.    Follow Up Recommendations  Home health OT     Assistance Recommended at Discharge Frequent or constant Supervision/Assistance (initially)  Patient can return home with the following  A lot of help with walking and/or transfers;A lot of help with bathing/dressing/bathroom;Direct supervision/assist for medications management;Direct supervision/assist for financial management;Assist for transportation;Help with stairs or ramp for entrance;Assistance with cooking/housework   Equipment Recommendations  BSC/3in1    Recommendations for Other Services      Precautions / Restrictions Precautions Precautions: Fall Precaution Comments: watch HR, SpO2 Restrictions Weight Bearing Restrictions: No       Mobility Bed Mobility Overal bed mobility:  Needs Assistance Bed Mobility: Supine to Sit, Sit to Supine     Supine to sit: Supervision Sit to supine: Supervision   General bed mobility comments: increased time, HOB elevated    Transfers                   General transfer comment: deferred due to HR     Balance Overall balance assessment: Needs assistance Sitting-balance support: Feet supported Sitting balance-Leahy Scale: Good                                     ADL either performed or assessed with clinical judgement   ADL Overall ADL's : Needs assistance/impaired     Grooming: Set up;Sitting;Oral care;Wash/dry face Grooming Details (indicate cue type and reason): brushing teeth and washing face sitting EOB         Upper Body Dressing : Minimal assistance;Sitting                          Extremity/Trunk Assessment Upper Extremity Assessment Upper Extremity Assessment: Generalized weakness   Lower Extremity Assessment Lower Extremity Assessment: Defer to PT evaluation        Vision   Vision Assessment?: No apparent visual deficits   Perception Perception Perception: Not tested   Praxis Praxis Praxis: Not tested    Cognition Arousal/Alertness: Lethargic Behavior During Therapy: Anxious, Restless Overall Cognitive Status: Impaired/Different from baseline Area of Impairment: Orientation, Attention, Memory, Following commands, Safety/judgement, Awareness, Problem solving                 Orientation Level: Time Current Attention Level: Selective Memory: Decreased short-term memory Following Commands: Follows one step commands  with increased time, Follows multi-step commands with increased time   Awareness: Emergent Problem Solving: Slow processing, Decreased initiation, Requires verbal cues, Requires tactile cues General Comments: frequently closing eyes during session        Exercises      Shoulder Instructions       General Comments HR in 130's  with seated ADLs    Pertinent Vitals/ Pain       Pain Assessment Pain Assessment: Faces Pain Score: 8  Faces Pain Scale: Hurts whole lot Pain Location: L chest and back Pain Descriptors / Indicators: Discomfort Pain Intervention(s): Limited activity within patient's tolerance, Monitored during session, Repositioned  Home Living                                          Prior Functioning/Environment              Frequency  Min 2X/week        Progress Toward Goals  OT Goals(current goals can now be found in the care plan section)  Progress towards OT goals: Progressing toward goals  Acute Rehab OT Goals Patient Stated Goal: none stated OT Goal Formulation: With patient Time For Goal Achievement: 10/30/22 Potential to Achieve Goals: Good ADL Goals Pt Will Perform Lower Body Bathing: with min guard assist;sit to/from stand;sitting/lateral leans;with adaptive equipment Pt Will Perform Lower Body Dressing: with min guard assist;with adaptive equipment;sitting/lateral leans;sit to/from stand Pt Will Transfer to Toilet: with min guard assist;ambulating;grab bars;regular height toilet Pt Will Perform Toileting - Clothing Manipulation and hygiene: with min guard assist;with adaptive equipment;sitting/lateral leans;sit to/from stand Additional ADL Goal #1: Patient will be able to come up with 3 strategies to manage anxiety in preparation for movement or when she is feeling short of breath.  Plan Discharge plan remains appropriate;Frequency remains appropriate    Co-evaluation                 AM-PAC OT "6 Clicks" Daily Activity     Outcome Measure   Help from another person eating meals?: A Little Help from another person taking care of personal grooming?: A Little Help from another person toileting, which includes using toliet, bedpan, or urinal?: A Lot Help from another person bathing (including washing, rinsing, drying)?: A Lot Help from another  person to put on and taking off regular upper body clothing?: A Little Help from another person to put on and taking off regular lower body clothing?: Total 6 Click Score: 14    End of Session Equipment Utilized During Treatment: Oxygen  OT Visit Diagnosis: Unsteadiness on feet (R26.81);Muscle weakness (generalized) (M62.81);Pain   Activity Tolerance Treatment limited secondary to medical complications (Comment)   Patient Left in bed;with call bell/phone within reach;with bed alarm set;with family/visitor present   Nurse Communication Mobility status;Other (comment) (HR)        Time: [id number] OT Time Calculation (min): 28 min  Charges: OT General Charges $OT Visit: 1 Visit OT Treatments $Self Care/Home Management : 23-37 mins  Renaye Rakers, OTD, OTR/L SecureChat Preferred Acute Rehab [phone]   Renaye Rakers [place]ce 10/17/2022, 2:11 PM

## 2022-10-17 NOTE — Procedures (Addendum)
PROCEDURE SUMMARY:  Successful US guided left thoracentesis. Yielded 500 ml of clear yellow fluid. Pt tolerated procedure well. No immediate complications. Please see full dictation under the imaging tab in Epic.  Specimen sent for labs. CXR ordered; no post-procedure pneumothorax identified.   EBL < 2 mL  Theresa Duty, NP 10/17/2022 1:33 PM

## 2022-10-18 ENCOUNTER — Inpatient Hospital Stay (HOSPITAL_COMMUNITY): Payer: Medicaid Other

## 2022-10-18 DIAGNOSIS — R6510 Systemic inflammatory response syndrome (SIRS) of non-infectious origin without acute organ dysfunction: Secondary | ICD-10-CM

## 2022-10-18 DIAGNOSIS — R0600 Dyspnea, unspecified: Secondary | ICD-10-CM

## 2022-10-18 LAB — CULTURE, RESPIRATORY W GRAM STAIN: Culture: NORMAL

## 2022-10-18 LAB — VANCOMYCIN, TROUGH: Vancomycin Tr: 6 ug/mL — ABNORMAL LOW (ref 15–20)

## 2022-10-18 MED ORDER — SALINE SPRAY 0.65 % NA SOLN
1.0000 | NASAL | Status: DC | PRN
  Administered 2022-10-18: 1 via NASAL
  Filled 2022-10-18: qty 44

## 2022-10-18 MED ORDER — DOCUSATE SODIUM 100 MG PO CAPS
100.0000 mg | ORAL_CAPSULE | Freq: Two times a day (BID) | ORAL | Status: DC
  Administered 2022-10-18 – 2022-10-20 (×6): 100 mg via ORAL
  Filled 2022-10-18 (×7): qty 1

## 2022-10-18 MED ORDER — HYDROMORPHONE HCL 2 MG PO TABS
2.0000 mg | ORAL_TABLET | ORAL | Status: DC | PRN
  Administered 2022-10-19: 2 mg via ORAL
  Filled 2022-10-18 (×4): qty 1

## 2022-10-18 MED ORDER — POLYETHYLENE GLYCOL 3350 17 G PO PACK
17.0000 g | PACK | Freq: Every day | ORAL | Status: DC
  Administered 2022-10-18 – 2022-10-28 (×3): 17 g via ORAL
  Filled 2022-10-18 (×7): qty 1

## 2022-10-18 MED ORDER — VANCOMYCIN HCL 1500 MG/300ML IV SOLN
1500.0000 mg | Freq: Three times a day (TID) | INTRAVENOUS | Status: DC
  Administered 2022-10-18 – 2022-10-27 (×27): 1500 mg via INTRAVENOUS
  Filled 2022-10-18 (×30): qty 300

## 2022-10-18 MED ORDER — GABAPENTIN 300 MG PO CAPS
300.0000 mg | ORAL_CAPSULE | Freq: Three times a day (TID) | ORAL | Status: DC
  Administered 2022-10-18 – 2022-10-21 (×9): 300 mg via ORAL
  Filled 2022-10-18 (×9): qty 1

## 2022-10-18 NOTE — Progress Notes (Signed)
   NAME:  Julie King, MRN:  [id number], DOB:  1991-03-28, LOS: 4 ADMISSION DATE:  10/14/2022, CONSULTATION DATE:  10/14/22 REFERRING MD:  Tamala Julian CHIEF COMPLAINT:  Dyspnea   History of Present Illness:  Julie King is a 32 y.o. female who is known to our practice for lingular mass. She has had robotic biopsy at [facility] 07/05/22 which was negative for malignancy. She had repeat CT scans that showed increase in size and also developed hemoptysis; therefore, she was seen at [facility] in December 2023. She underwent bronch and biopsy again with similar results of no malignancy, just inflammatory cells. She was then referred to [facility] and ultimately had VATS with wedge resection of lingular lung mass on 2/8 (Dr. Roxan Hockey). Pathology was negative for malignancy and showed inflammation with focal necrosis, fibrosis, and pneumocyte hyperplasia. She initially did well but then around 2/14, began to experience worsening chest pain that radiated into her back along with subjective fevers, cough productive of gray-black sputum, nausea, orthopnea. She has had no drainage from her surgical site.  She presented to [facility] ED 2/15 with above symptoms. She had CTA that was negative for PE but showed post surgical changes and small to moderate L pleural effusion. EKG and troponins were negative. She was admitted by [facility] and was started on Ceftriaxone and Azithromycin.  She was seen by IR for possible thoracentesis but due to combination of increased WOB and small effusion, this was deferred.   Afternoon of 2/16, she had ongoing dyspnea despite O2 sat 98% on room air. PCCM called to see in consultation.  Pertinent  Medical History:  has Headache; Morbidly obese ([place]); History of atrial fibrillation; Essential hypertension; Family history of breast cancer; Hemoptysis; Lung mass; Mass of left lung; SIRS (systemic inflammatory response syndrome) ([place]); Healthcare-associated pneumonia; Pleural effusion; Microcytic hypochromic  anemia; Tobacco abuse; Chest pain; and Pneumonia on their problem list.  Significant Hospital Events: Including procedures, antibiotic start and stop dates in addition to other pertinent events   2/16 admit, PCCM consult  Interim History / Subjective:   Feeling better today physically and emotionally Dyspnea improved Anxiety better  Objective:  Blood pressure 111/67, pulse (!) 114, temperature 99.5 F (37.5 C), temperature source Oral, resp. rate 20, height 5' 7"$  (1.702 m), weight (!) 145 kg, last menstrual period 09/30/2022, SpO2 97 %.        Intake/Output Summary (Last 24 hours) at 10/18/2022 1148 Last data filed at 10/17/2022 2000 Gross per 24 hour  Intake 416.26 ml  Output 850 ml  Net -433.74 ml     Filed Weights   10/14/22 0152  Weight: (!) 145 kg    Examination: Deferred as in procedure  Labs/imaging personally reviewed:  CXR Appears worse, effusion larger  Assessment & Plan:   Loculated pleural effusion: With recent VATS, concern for hemothorax versus infection, high suspicion for infected space given appearance as well as strep on lung cultures from resection, suspect infection was resected. - CVTS following, recommending to monitor effusion with CXR in the AM and decide if further interventions are necessary - In the mean time will follow pleural fluid. Neutrophil 87, Glucose 26, but initial gram stain negative. Defer to CVTS.  - I encouraged incentive spirometry and mobility.    Signature:     Georgann Housekeeper, AGACNP-BC [facility]  See Amion for personal pager PCCM on call pager [phone] until 7pm. Please call Elink 7p-7a. [phone]  10/18/2022 11:57 AM

## 2022-10-18 NOTE — Progress Notes (Addendum)
      [address]       [place] [address]             [phone]           Subjective: Patient still with pain on left;does not think any worse than before.  Objective: Vital signs in last 24 hours: Temp:  [98.8 F (37.1 C)-99.9 F (37.7 C)] 99.5 F (37.5 C) (02/20 0726) Pulse Rate:  [107-123] 114 (02/20 0726) Cardiac Rhythm: Sinus tachycardia (02/19 2000) Resp:  [20] 20 (02/20 0726) BP: (110-159)/(61-118) 111/67 (02/20 0726) SpO2:  [97 %-100 %] 97 % (02/20 0726)   Intake/Output from previous day: 02/19 0701 - 02/20 0700 In: 416.3 [P.O.:240; IV Piggyback:176.3] Out: 850 [Urine:850]   Physical Exam:  Cardiovascular: Tachycardic Pulmonary: Clear to auscultation on right and diminished left basilar breath sounds Abdomen: Soft, obese, non tender, bowel sounds present. Extremities: Mild bilateral lower extremity edema. Wounds: Clean and dry.  No erythema or signs of infection. Left chest tube wound with superficial skin edge separation.    Lab Results: CBC: Recent Labs    10/17/22 0939 10/17/22 1732  WBC 19.6* 15.5*  HGB 7.8* 8.3*  HCT 25.0* 27.0*  PLT 291 263   BMET:  Recent Labs    10/16/22 0116 10/17/22 0939  NA 133* 136  K 4.3 3.8  CL 99 99  CO2 23 23  GLUCOSE 119* 121*  BUN 8 6  CREATININE 0.63 0.65  CALCIUM 8.6* 8.8*    PT/INR: No results for input(s): "LABPROT", "INR" in the last 72 hours. ABG:  INR: Will add last result for INR, ABG once components are confirmed Will add last 4 CBG results once components are confirmed  Assessment/Plan:  1. CV - ST.  2.  Pulmonary - S/p left thoracentesis with 500 ml removed. On 4 liters of oxygen via [place]. Wean as able. Check CXR in am. Encourage incentive spirometer 3. ID-on Vancomycin and Cefepime for PNA. Gram stain left pleural fluid shows no organisms. Culture shows no growth 24 hours. 4. On Lovenox for DVT prophylaxis 5. Regarding pain control, on scheduled Neurontin tid,  Dilaudid PRN, Toradol PRN.  Leeon Makar M ZimmermanPA-C 10/18/2022,8:38 AM

## 2022-10-18 NOTE — Progress Notes (Signed)
PROGRESS NOTE    Julie King  [id number] DOB: November 14, 1990 DOA: 10/14/2022 PCP: Hattie Perch, MD    Brief Narrative:  32 year old with recently diagnosed lingular mass, robotic biopsy 06/2022 negative for malignancy, repeat CT scans with increasing size and hemoptysis.   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  Pathology negative for malignancy and showed inflammation with focal necrosis, fibrosis and pneumocyte hyperplasia.  Discharge home after surgery.    Presents back to the emergency room with 3 days of worsening left-sided chest pain, subjective fever, cough, nausea and orthopnea.  CT angiogram with postsurgical changes small left pleural effusion. Remains in the hospital.  Persistently febrile. 2/19, repeat chest x-ray with increased effusion on the left side.  IR guided thoracentesis 500 mL removed, no growth so far.  Some clinical improvement today.   Assessment & Plan:   Left lower lobe pneumonia, complicated parapneumonic effusion with pleurisy, Postoperative pleural space infection. Sepsis present on admission.  Currently on broad-spectrum antibiotics with vancomycin and cefepime.  Completed azithromycin for 3 days.   Continue aggressive chest physiotherapy, incentive spirometry, deep breathing exercises, sputum induction, mucolytic's and bronchodilators. Sputum cultures 2/17, rare gram-positive cocci. Blood cultures, negative so far. Surgical culture from 2/8 with Streptococcus. Fluid culture from 2/19, no growth so far.  Minimal leukocytes.  LDH 600. Supplemental oxygen to keep saturations more than 90%. Adequate pain medications, mobilize. Appreciate pulmonary follow-up. CT angiogram negative for PE, DVT studies negative. Pulmonary and CT surgery following. 2/20, fever curve improving, leukocytosis increasing. Repeat chest x-ray planned for tomorrow.  Abnormal urine: Urine cultures pending.  Already on broad-spectrum  antibiotics.  Morbid obesity with BMI more than 50: Definitely she will benefit with weight loss and lifestyle modification.  Will continue to have discussion.  Mobilize.  Out of bed.  Decrease injectable narcotics use.  Laxatives.  Use NSAIDs and oral pain medications.  Incentive spirometry and deep breathing exercises.   DVT prophylaxis: enoxaparin (LOVENOX) injection 40 mg Start: 10/14/22 1115   Code Status: Full code Family Communication: None at the bedside.   Disposition Plan: Status is: Inpatient Remains inpatient appropriate because: Severe pleuritic chest pain, IV antibiotics.  Inpatient procedures planned.     Consultants:  Pulmonary CT surgery,  IR  Procedures:  Thoracentesis left pleural space 2/19, 500 mL exudative fluid removed.  Antimicrobials:  Rocephin azithromycin 2/16--- Azithromycin, vancomycin and cefepime 2/17--   Subjective:  Patient seen and examined.  Looks comfortable on interview.  Emotionally labile.  She is more motivated to walk today.  Tmax 99.9.  Sinus tachycardia but much better than before.  On 4 L oxygen.   Objective: Vitals:   10/17/22 2010 10/18/22 0008 10/18/22 0500 10/18/22 0726  BP: (!) 159/118 110/61  111/67  Pulse: (!) 123 (!) 107  (!) 114  Resp: 20 20  20  $ Temp: 99.9 F (37.7 C) 98.8 F (37.1 C) 99.7 F (37.6 C) 99.5 F (37.5 C)  TempSrc: Oral Oral Oral Oral  SpO2: 100% 100%  97%  Weight:      Height:        Intake/Output Summary (Last 24 hours) at 10/18/2022 1129 Last data filed at 10/17/2022 2000 Gross per 24 hour  Intake 416.26 ml  Output 850 ml  Net -433.74 ml    Filed Weights   10/14/22 0152  Weight: (!) 145 kg    Examination:  General exam: Looks comfortable on approaching to interview.  Anxious on conversation. Respiratory system: Poor inspiratory  effort.  Poor air entry at the left side.  Incisions are clean and dry. SpO2: 97 % O2 Flow Rate (L/min): 4 L/min  Cardiovascular system: S1 & S2 heard,  RRR.  Tachycardic.   Gastrointestinal system: Abdomen is nondistended, soft and nontender. No organomegaly or masses felt. Normal bowel sounds heard.  Obese and pendulous. Central nervous system: Alert and oriented. No focal neurological deficits. Extremities: Symmetric 5 x 5 power. Skin: No rashes, lesions or ulcers    Data Reviewed: I have personally reviewed following labs and imaging studies  CBC: Recent Labs  Lab 10/14/22 0229 10/15/22 0116 10/16/22 0116 10/17/22 0939 10/17/22 1732  WBC 15.9* 22.5* 21.2* 19.6* 15.5*  NEUTROABS 12.7*  --  17.1* 16.1*  --   HGB 10.4* 9.5* 8.7* 7.8* 8.3*  HCT 34.5* 31.3* 28.2* 25.0* 27.0*  MCV 59.6* 59.3* 58.8* 58.1* 58.3*  PLT 299 274 269 291 [id number]    Basic Metabolic Panel: Recent Labs  Lab 10/13/22 1459 10/14/22 0229 10/15/22 0116 10/16/22 0116 10/17/22 0939  NA 135 134* 134* 133* 136  K 4.3 4.2 4.2 4.3 3.8  CL 99 99 99 99 99  CO2 26 21* 25 23 23  $ GLUCOSE 89 114* 144* 119* 121*  BUN 9 11 9 8 6  $ CREATININE 0.71 0.77 0.81 0.63 0.65  CALCIUM 9.0 9.1 8.8* 8.6* 8.8*  MG  --   --   --  1.9  --     GFR: Estimated Creatinine Clearance: 152.8 mL/min (by C-G formula based on SCr of 0.65 mg/dL). Liver Function Tests: Recent Labs  Lab 10/14/22 0850 10/16/22 0116 10/17/22 0939  AST 19 17 27  $ ALT 18 18 23  $ ALKPHOS 77 81 88  BILITOT 0.6 0.6 0.7  PROT 7.2 7.1 7.2  ALBUMIN 3.1* 2.8* 2.5*    No results for input(s): "LIPASE", "AMYLASE" in the last 168 hours. No results for input(s): "AMMONIA" in the last 168 hours. Coagulation Profile: No results for input(s): "INR", "PROTIME" in the last 168 hours. Cardiac Enzymes: No results for input(s): "CKTOTAL", "CKMB", "CKMBINDEX", "TROPONINI" in the last 168 hours. BNP (last 3 results) No results for input(s): "PROBNP" in the last 8760 hours. HbA1C: No results for input(s): "HGBA1C" in the last 72 hours. CBG: No results for input(s): "GLUCAP" in the last 168 hours. Lipid Profile: No  results for input(s): "CHOL", "HDL", "LDLCALC", "TRIG", "CHOLHDL", "LDLDIRECT" in the last 72 hours. Thyroid Function Tests: No results for input(s): "TSH", "T4TOTAL", "FREET4", "T3FREE", "THYROIDAB" in the last 72 hours. Anemia Panel: No results for input(s): "VITAMINB12", "FOLATE", "FERRITIN", "TIBC", "IRON", "RETICCTPCT" in the last 72 hours. Sepsis Labs: Recent Labs  Lab 10/14/22 0912 10/14/22 1709 10/15/22 0116  PROCALCITON  --  <0.10 0.15  LATICACIDVEN 1.1 0.9  --      Recent Results (from the past 240 hour(s))  Resp panel by RT-PCR (RSV, Flu A&B, Covid) Anterior Nasal Swab     Status: None   Collection Time: 10/14/22  3:32 AM   Specimen: Anterior Nasal Swab  Result Value Ref Range Status   SARS Coronavirus 2 by RT PCR NEGATIVE NEGATIVE Final   Influenza A by PCR NEGATIVE NEGATIVE Final   Influenza B by PCR NEGATIVE NEGATIVE Final    Comment: (NOTE) The Xpert Xpress SARS-CoV-2/FLU/RSV plus assay is intended as an aid in the diagnosis of influenza from Nasopharyngeal swab specimens and should not be used as a sole basis for treatment. Nasal washings and aspirates are unacceptable for Xpert Xpress SARS-CoV-2/FLU/RSV testing.  Fact Sheet for Patients: EntrepreneurPulse.com.au  Fact Sheet for Healthcare Providers: IncredibleEmployment.be  This test is not yet approved or cleared by the [place] FDA and has been authorized for detection and/or diagnosis of SARS-CoV-2 by FDA under an Emergency Use Authorization (EUA). This EUA will remain in effect (meaning this test can be used) for the duration of the COVID-19 declaration under Section 564(b)(1) of the Act, 21 U.S.C. section 360bbb-3(b)(1), unless the authorization is terminated or revoked.     Resp Syncytial Virus by PCR NEGATIVE NEGATIVE Final    Comment: (NOTE) Fact Sheet for Patients: EntrepreneurPulse.com.au  Fact Sheet for Healthcare  Providers: IncredibleEmployment.be  This test is not yet approved or cleared by the [place] FDA and has been authorized for detection and/or diagnosis of SARS-CoV-2 by FDA under an Emergency Use Authorization (EUA). This EUA will remain in effect (meaning this test can be used) for the duration of the COVID-19 declaration under Section 564(b)(1) of the Act, 21 U.S.C. section 360bbb-3(b)(1), unless the authorization is terminated or revoked.  Performed at [facility], [place] [address] [place], [place] [address]   Blood culture (routine x 2)     Status: None (Preliminary result)   Collection Time: 10/14/22  7:11 AM   Specimen: BLOOD  Result Value Ref Range Status   Specimen Description BLOOD RIGHT ANTECUBITAL  Final   Special Requests   Final    BOTTLES DRAWN AEROBIC AND ANAEROBIC Blood Culture results may not be optimal due to an inadequate volume of blood received in culture bottles   Culture   Final    NO GROWTH 4 DAYS Performed at [facility], [place] [address], [place], [place] [address]    Report Status PENDING  Incomplete  Blood culture (routine x 2)     Status: None (Preliminary result)   Collection Time: 10/14/22  5:09 PM   Specimen: BLOOD LEFT HAND  Result Value Ref Range Status   Specimen Description BLOOD LEFT HAND  Final   Special Requests   Final    BOTTLES DRAWN AEROBIC AND ANAEROBIC Blood Culture adequate volume   Culture   Final    NO GROWTH 4 DAYS Performed at [facility], [place] [address], [place], [place] [address]    Report Status PENDING  Incomplete  MRSA Next Gen by PCR, Nasal     Status: None   Collection Time: 10/15/22  8:32 AM   Specimen: Nasal Mucosa; Nasal Swab  Result Value Ref Range Status   MRSA by PCR Next Gen NOT DETECTED NOT DETECTED Final    Comment: (NOTE) The GeneXpert MRSA Assay (FDA approved for NASAL specimens only), is one component of a comprehensive MRSA colonization surveillance program. It  is not intended to diagnose MRSA infection nor to guide or monitor treatment for MRSA infections. Test performance is not FDA approved in patients less than 33 years old. Performed at [facility], [place] [address] [place], [place] [address]   Expectorated Sputum Assessment w Gram Stain, Rflx to Resp Cult     Status: None   Collection Time: 10/15/22  8:40 PM   Specimen: Sputum  Result Value Ref Range Status   Specimen Description SPUTUM  Final   Special Requests NONE  Final   Sputum evaluation   Final    THIS SPECIMEN IS ACCEPTABLE FOR SPUTUM CULTURE Performed at [facility], [place] [address], [place], [place] [address]    Report Status 10/17/2022 FINAL  Final  Culture, Respiratory w Gram Stain     Status: None   Collection Time: 10/15/22  8:40 PM   Specimen: SPU  Result Value Ref Range Status   Specimen Description SPUTUM  Final   Special Requests NONE Reflexed from [id number]  Final   Gram Stain   Final    MODERATE WBC PRESENT,BOTH PMN AND MONONUCLEAR RARE GRAM POSITIVE COCCI IN PAIRS    Culture   Final    RARE Normal respiratory flora-no Staph aureus or Pseudomonas seen Performed at [facility], [address] [place], [place] [address]    Report Status 10/18/2022 FINAL  Final  Body fluid culture w Gram Stain     Status: None (Preliminary result)   Collection Time: 10/17/22 12:32 PM   Specimen: Lung, Left; Pleural Fluid  Result Value Ref Range Status   Specimen Description FLUID  Final   Special Requests  PLEURAL , LEFT LUNG  Final   Gram Stain   Final    WBC PRESENT,BOTH PMN AND MONONUCLEAR NO ORGANISMS SEEN CYTOSPIN SMEAR    Culture   Final    NO GROWTH < 24 HOURS Performed at [facility], [place] [address] [place], [place] [address]    Report Status PENDING  Incomplete         Radiology Studies: IR THORACENTESIS ASP PLEURAL SPACE W/IMG GUIDE  Result Date: 10/17/2022 INDICATION: Patient s/p left VATS and wedge resection 10/06/22. Patient  presents today with a left pleural effusion. Interventional radiology asked to perform a diagnostic and therapeutic thoracentesis. EXAM: ULTRASOUND GUIDED THORACENTESIS MEDICATIONS: 1% lidocaine 20 mL COMPLICATIONS: SIR LEVEL B - Normal therapy, CBC/labs ordered. Spleen mistaken for pleural effusion initially. PROCEDURE: An ultrasound guided thoracentesis was thoroughly discussed with the patient and questions answered. The benefits, risks, alternatives and complications were also discussed. The patient understands and wishes to proceed with the procedure. Written consent was obtained. Ultrasound was performed to localize and mark an adequate pocket of fluid in the left chest. The area was then prepped and draped in the normal sterile fashion. 1% Lidocaine was used for local anesthesia. Under ultrasound guidance a 19 gauge 10-cm Yueh catheter was introduced inadvertently into the spleen. The catheter was removed. Ultrasound evaluation with color doppler demonstrated no evidence of surrounding hematoma or other complicating features. A more superior location in the left posterior thorax was selected for thoracentesis. Subdermal Local anesthesia was applied with 1% lidocaine. Deeper local anesthetic was administered ultrasound guidance adjacent to the pleura. Under direct ultrasound visualization, a 10 cm Yueh needle was directed into the pleural space. Thoracentesis was performed. The catheter was removed and a dressing applied. FINDINGS: A total of approximately 500 ml of clear yellow fluid was removed. Samples were sent to the laboratory as requested by the clinical team. IMPRESSION: Successful ultrasound guided left thoracentesis yielding 500 mL of pleural fluid. Read by: Soyla Dryer, NP Electronically Signed   By: Ruthann Cancer M.D.   On: 10/17/2022 13:50   DG Chest 1 View  Result Date: 10/17/2022 CLINICAL DATA:  [id number] S/P thoracentesis [id number] EXAM: CHEST  1 VIEW COMPARISON:  Radiograph 10/17/2022  FINDINGS: Unchanged cardiomegaly. Decreased but persistent left pleural effusion after thoracentesis. No evidence of pneumothorax. Slight improved aeration in the left lower lung. Persistent right lower lung airspace disease. Bones are unchanged. IMPRESSION: Decreased left pleural effusion and slight improved aeration in the left lower lung after thoracentesis. No evidence of pneumothorax. Persistent right lower lung airspace disease. Electronically Signed  By: Maurine Simmering M.D.   On: 10/17/2022 12:55   DG CHEST PORT 1 VIEW  Result Date: 10/17/2022 CLINICAL DATA:  Pneumonia EXAM: PORTABLE CHEST 1 VIEW COMPARISON:  10/14/2022 FINDINGS: Stable heart size. Large left pleural effusion, increased. Worsening bibasilar airspace opacities. Probable small right pleural effusion. No pneumothorax. IMPRESSION: 1. Large left pleural effusion, increased. 2. Worsening bibasilar airspace opacities. Electronically Signed   By: Davina Poke D.O.   On: 10/17/2022 08:46        Scheduled Meds:  docusate sodium  100 mg Oral BID   enoxaparin (LOVENOX) injection  40 mg Subcutaneous Q24H   gabapentin  300 mg Oral TID   guaiFENesin  600 mg Oral BID   ketorolac  30 mg Intravenous Q6H   miconazole nitrate   Topical BID   polyethylene glycol  17 g Oral Daily   sodium chloride flush  3 mL Intravenous Q12H   Continuous Infusions:  ceFEPime (MAXIPIME) IV 2 g (10/18/22 0606)   vancomycin 1,500 mg (10/18/22 0832)      LOS: 4 days    Time spent: 35 minutes    Barb Merino, MD [facility] Pager [phone]

## 2022-10-18 NOTE — Progress Notes (Signed)
Physical Therapy Treatment Patient Details Name: Julie King MRN: [id number] DOB: 1991-06-27 Today's Date: 10/18/2022   History of Present Illness 32 year old with recently diagnosed lingular mass, robotic biopsy 06/2022 negative for malignancy, repeat CT scans with increasing size and hemoptysis. 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   Admitted due to L side chest pain, fever, cough and orthopnea found to have L pleural effusion.    PT Comments    Pt received in supine, agreeable to therapy session with good participation and tolerance for transfer and gait training short distance into hallway. Pt Supervision for bed mobility/transfers with cues for line awareness and min guard for safety with gait. BP and SpO2 WFL on 4L O2 [place], HR to 120's bpm with exertion, ~110 bpm resting. Pt up in recliner at end of session, pt requesting chair alarm not be activated, RN notified, pt A&O today and able to demo back use of call Julie. Pt continues to benefit from PT services to progress toward functional mobility goals.    Recommendations for follow up therapy are one component of a multi-disciplinary discharge planning process, led by the attending physician.  Recommendations may be updated based on patient status, additional functional criteria and insurance authorization.  Follow Up Recommendations  Home health PT     Assistance Recommended at Discharge Intermittent Supervision/Assistance  Patient can return home with the following A little help with walking and/or transfers;Assistance with cooking/housework;Assist for transportation;Help with stairs or ramp for entrance;A little help with bathing/dressing/bathroom   Equipment Recommendations  None recommended by PT    Recommendations for Other Services       Precautions / Restrictions Precautions Precautions: Fall Precaution Comments: watch HR, SpO2, RR Restrictions Weight Bearing Restrictions: No      Mobility  Bed Mobility Overal bed mobility: Needs Assistance Bed Mobility: Supine to Sit     Supine to sit: Supervision     General bed mobility comments: assist with line mgmt    Transfers Overall transfer level: Needs assistance Equipment used: Rolling walker (2 wheels) (bariatric) Transfers: Sit to/from Stand Sit to Stand: Supervision           General transfer comment: assist with line mgmt    Ambulation/Gait Ambulation/Gait assistance: Min guard Gait Distance (Feet): 70 Feet Assistive device: Rolling walker (2 wheels) (bari) Gait Pattern/deviations: Step-through pattern, Decreased stride length Gait velocity: grossly <0.4 m/s     General Gait Details: good RW management, mild c/o dizziness toward end of trial so pt returned to room to sit up in recliner; HR 120's bpm with exertion/SpO2 WFL on 4L O2 [place].   Stairs             Wheelchair Mobility    Modified Rankin (Stroke Patients Only)       Balance Overall balance assessment: Needs assistance Sitting-balance support: Feet supported Sitting balance-Leahy Scale: Good Sitting balance - Comments: static sitting and weight shifting no LOB   Standing balance support: Bilateral upper extremity supported, During functional activity Standing balance-Leahy Scale: Fair Standing balance comment: with RW support, no LOB, pt able to stand with U UE support for static tasks                            Cognition Arousal/Alertness: Awake/alert Behavior During Therapy: WFL for tasks assessed/performed Overall Cognitive Status: Impaired/Different from baseline  Following Commands: Follows one step commands consistently Safety/Judgement: Decreased awareness of deficits Awareness: Emergent Problem Solving: Requires verbal cues General Comments: Pt received in supine, more alert and with improved activity tolerance this date.        Exercises Other  Exercises Other Exercises: using incentive spirometer x3 reps 700 mL    General Comments General comments (skin integrity, edema, etc.): SpO2 desat briefly on 3L with exertion, needed 4L O2 [place] to maintain >92%.      Pertinent Vitals/Pain Pain Assessment Pain Assessment: Faces Faces Pain Scale: Hurts little more Pain Location: L chest and back Pain Descriptors / Indicators: Discomfort, Grimacing Pain Intervention(s): Monitored during session, Repositioned    Home Living                          Prior Function            PT Goals (current goals can now be found in the care plan section) Acute Rehab PT Goals Patient Stated Goal: to feel better and go home/back to work PT Goal Formulation: With patient Time For Goal Achievement: 10/28/22 Progress towards PT goals: Progressing toward goals    Frequency    Min 3X/week      PT Plan Current plan remains appropriate    Co-evaluation              AM-PAC PT "6 Clicks" Mobility   Outcome Measure  Help needed turning from your back to your side while in a flat bed without using bedrails?: None Help needed moving from lying on your back to sitting on the side of a flat bed without using bedrails?: A Little Help needed moving to and from a bed to a chair (including a wheelchair)?: A Little Help needed standing up from a chair using your arms (e.g., wheelchair or bedside chair)?: A Little Help needed to walk in hospital room?: A Little Help needed climbing 3-5 steps with a railing? : A Lot 6 Click Score: 18    End of Session Equipment Utilized During Treatment: Oxygen Activity Tolerance: Patient tolerated treatment well Patient left: with call Julie/phone within reach;in chair;Other (comment) (pt defers chair alarm, able to demo back use of call Julie; NT notified it is not plugged in, but it is in chair in case she becomes more confused) Nurse Communication: Mobility status PT Visit Diagnosis: Muscle weakness  (generalized) (M62.81);Difficulty in walking, not elsewhere classified (R26.2)     Time: [id number] PT Time Calculation (min) (ACUTE ONLY): 39 min  Charges:  $Gait Training: 23-37 mins $Therapeutic Activity: 8-22 mins                     Ayde Record P., PTA Acute Rehabilitation Services Secure Chat Preferred 9a-5:30pm Office: [place] 10/18/2022, 5:49 PM

## 2022-10-18 NOTE — Progress Notes (Signed)
Pharmacy Antibiotic Note  Julie King is a 32 y.o. female admitted on 10/14/2022 with pneumonia.  Pharmacy has been consulted for vancomycin and cefepime dosing.  Vanc peak 16, trough 6 >> AUC 250, below goal.  Plan: Change vancomycin to 1513m IV Q8H. Continue cefepime 2g IV Q8H.  Height: 5' 7"$  (170.2 cm) Weight: (!) 145 kg (319 lb 10.7 oz) IBW/kg (Calculated) : 61.6  Temp (24hrs), Avg:99.6 F (37.6 C), Min:98.8 F (37.1 C), Max:100 F (37.8 C)  Recent Labs  Lab 10/13/22 1459 10/14/22 0229 10/14/22 0912 10/14/22 1709 10/15/22 0116 10/16/22 0116 10/17/22 0939 10/17/22 1732 10/17/22 2357  WBC 15.0* 15.9*  --   --  22.5* 21.2* 19.6* 15.5*  --   CREATININE 0.71 0.77  --   --  0.81 0.63 0.65  --   --   LATICACIDVEN  --   --  1.1 0.9  --   --   --   --   --   VANCOTROUGH  --   --   --   --   --   --   --   --  6*  VANCOPEAK  --   --   --   --   --   --   --  16*  --     Estimated Creatinine Clearance: 152.8 mL/min (by C-G formula based on SCr of 0.65 mg/dL).    Allergies  Allergen Reactions   Percocet [Oxycodone-Acetaminophen] Hives, Itching and Nausea Only   Pineapple Itching    Fresh pineapples; pineapples from a can is okay    Thank you for allowing pharmacy to be a part of this patient's care.  VWynona Neat PharmD, BCPS  10/18/2022 1:48 AM

## 2022-10-19 ENCOUNTER — Inpatient Hospital Stay (HOSPITAL_COMMUNITY): Payer: Medicaid Other

## 2022-10-19 DIAGNOSIS — R6510 Systemic inflammatory response syndrome (SIRS) of non-infectious origin without acute organ dysfunction: Secondary | ICD-10-CM | POA: Diagnosis not present

## 2022-10-19 DIAGNOSIS — J90 Pleural effusion, not elsewhere classified: Secondary | ICD-10-CM

## 2022-10-19 LAB — IRON AND TIBC
Iron: 16 ug/dL — ABNORMAL LOW (ref 28–170)
Saturation Ratios: 8 % — ABNORMAL LOW (ref 10.4–31.8)
TIBC: 209 ug/dL — ABNORMAL LOW (ref 250–450)
UIBC: 193 ug/dL

## 2022-10-19 LAB — CBC WITH DIFFERENTIAL/PLATELET
Abs Immature Granulocytes: 0.05 10*3/uL (ref 0.00–0.07)
Basophils Absolute: 0.1 10*3/uL (ref 0.0–0.1)
Basophils Relative: 1 %
Eosinophils Absolute: 0.4 10*3/uL (ref 0.0–0.5)
Eosinophils Relative: 4 %
HCT: 23.8 % — ABNORMAL LOW (ref 36.0–46.0)
Hemoglobin: 7.3 g/dL — ABNORMAL LOW (ref 12.0–15.0)
Immature Granulocytes: 1 %
Lymphocytes Relative: 21 %
Lymphs Abs: 2.2 10*3/uL (ref 0.7–4.0)
MCH: 17.9 pg — ABNORMAL LOW (ref 26.0–34.0)
MCHC: 30.7 g/dL (ref 30.0–36.0)
MCV: 58.5 fL — ABNORMAL LOW (ref 80.0–100.0)
Monocytes Absolute: 0.8 10*3/uL (ref 0.1–1.0)
Monocytes Relative: 7 %
Neutro Abs: 7.2 10*3/uL (ref 1.7–7.7)
Neutrophils Relative %: 66 %
Platelets: 244 10*3/uL (ref 150–400)
RBC: 4.07 MIL/uL (ref 3.87–5.11)
RDW: 20 % — ABNORMAL HIGH (ref 11.5–15.5)
WBC: 10.7 10*3/uL — ABNORMAL HIGH (ref 4.0–10.5)
nRBC: 0.6 % — ABNORMAL HIGH (ref 0.0–0.2)

## 2022-10-19 LAB — CULTURE, BLOOD (ROUTINE X 2)
Culture: NO GROWTH
Culture: NO GROWTH
Special Requests: ADEQUATE

## 2022-10-19 LAB — VANCOMYCIN, PEAK: Vancomycin Pk: 18 ug/mL — ABNORMAL LOW (ref 30–40)

## 2022-10-19 LAB — CYTOLOGY - NON PAP

## 2022-10-19 LAB — VITAMIN B12: Vitamin B-12: 206 pg/mL (ref 180–914)

## 2022-10-19 LAB — FERRITIN: Ferritin: 104 ng/mL (ref 11–307)

## 2022-10-19 LAB — VANCOMYCIN, TROUGH: Vancomycin Tr: 11 ug/mL — ABNORMAL LOW (ref 15–20)

## 2022-10-19 MED ORDER — LORAZEPAM 2 MG/ML IJ SOLN
0.5000 mg | Freq: Four times a day (QID) | INTRAMUSCULAR | Status: DC | PRN
  Administered 2022-10-19 – 2022-10-21 (×5): 0.5 mg via INTRAVENOUS
  Filled 2022-10-19 (×5): qty 1

## 2022-10-19 MED ORDER — HYDROMORPHONE HCL 1 MG/ML IJ SOLN
1.0000 mg | INTRAMUSCULAR | Status: DC | PRN
  Administered 2022-10-19 – 2022-10-22 (×14): 1 mg via INTRAVENOUS
  Filled 2022-10-19 (×14): qty 1

## 2022-10-19 NOTE — Progress Notes (Addendum)
      [address]       [place] [address]             [phone]           Subjective: Patient sitting in chair this am. She is wondering what the next step is.  Objective: Vital signs in last 24 hours: Temp:  [99 F (37.2 C)-99.6 F (37.6 C)] 99.5 F (37.5 C) (02/21 0405) Pulse Rate:  [92-114] 92 (02/21 0405) Cardiac Rhythm: Sinus tachycardia (02/20 2006) Resp:  [17-20] 20 (02/21 0405) BP: (111-139)/(67-86) 139/73 (02/21 0405) SpO2:  [97 %-100 %] 100 % (02/21 0405)   Intake/Output from previous day: 02/20 0701 - 02/21 0700 In: 1284.7 [IV Piggyback:1284.7] Out: -    Physical Exam:  Cardiovascular: Tachycardic Pulmonary: Diminished breath sounds on left Abdomen: Soft, obese, non tender, bowel sounds present. Extremities: Mild bilateral lower extremity edema. Wounds: Clean and dry.  No erythema or signs of infection. Left chest tube wound with superficial skin edge separation.    Lab Results: CBC: Recent Labs    10/17/22 1732 10/19/22 0149  WBC 15.5* 10.7*  HGB 8.3* 7.3*  HCT 27.0* 23.8*  PLT 263 244    BMET:  Recent Labs    10/17/22 0939  NA 136  K 3.8  CL 99  CO2 23  GLUCOSE 121*  BUN 6  CREATININE 0.65  CALCIUM 8.8*     PT/INR: No results for input(s): "LABPROT", "INR" in the last 72 hours. ABG:  INR: Will add last result for INR, ABG once components are confirmed Will add last 4 CBG results once components are confirmed  Assessment/Plan:  1. CV - ST;some of which is related to pain 2.  Pulmonary - S/p left thoracentesis with 500 ml removed. On 4 liters of oxygen via [place]. Wean as able. CXR this am shows patient rotated to the left,  Increased in left consolidation (pleural effusion) since CXR after thoracentesis. Will discuss with Dr. Roxan Hockey (?IR vs VATS). Encourage incentive spirometer and flutter valve 3. ID-on Vancomycin and Cefepime for PNA. WBC decreased to 10,700.Gram stain left pleural fluid shows no  organisms. Culture shows no growth 24 hours. 4. On Lovenox for DVT prophylaxis 5. Anemia-H and H this am decreased to 7.3 and 23.8. Per primary 6. Regarding pain control, on scheduled Neurontin tid, Dilaudid PRN, Toradol PRN.  Donielle M ZimmermanPA-C 10/19/2022,6:58 AM  Patient seen and examined, agree with above Looks better than she did a couple of days ago but SOB with minimal activity CXR stil shows large parapneumonic effusion, likely loculated. I think the best option for management of the effusion is to take her back to OR for left VATS to drain the effusion and possibly decorticate the lung.  I discussed the procedure with her and her mother (by phone).  She is agreeable.  Will plan OR Friday  Revonda Standard. Roxan Hockey, MD [facility] [phone]

## 2022-10-19 NOTE — Progress Notes (Signed)
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  Pathology negative for malignancy and showed inflammation with focal necrosis, fibrosis and pneumocyte hyperplasia.  Discharge home after surgery.    Presented back to the emergency room with 3 days of worsening left-sided chest pain, subjective fever, cough, nausea and orthopnea.  CT angiogram with postsurgical changes small left pleural effusion. Patient remained in the hospital, she was persistently febrile and leukocytosis. 2/19, repeat chest x-ray with increased effusion on the left side.  IR guided thoracentesis 500 mL removed, no growth so far.  Clinically improving with improved fever and improved leukocytosis counts today.   Assessment & plan of care:   Left lower lobe pneumonia, complicated parapneumonic effusion with pleurisy, Postoperative pleural space infection. Sepsis present on admission.  Currently on broad-spectrum antibiotics with vancomycin and cefepime.  Completed azithromycin for 3 days.   Continue aggressive chest physiotherapy, incentive spirometry, deep breathing exercises, sputum induction, mucolytic's and bronchodilators. Sputum cultures 2/17, rare gram-positive cocci. Blood cultures, negative so far. Surgical culture from 2/8 with Streptococcus. Fluid culture from 2/19, no growth so far.  Minimal leukocytes.  LDH 600. Supplemental oxygen to keep saturations more than 90%. Encourage oral pain medications and NSAIDs, will discuss IV narcotics. Appreciate pulmonary follow-up. CT angiogram negative for PE, DVT studies negative. Pulmonary and CT surgery following. Fever curve improving, leukocytosis increasing. Repeat  x-rays with persistent loculated left-sided pleural effusion, may likely need VATS.   Acute on chronic microcytic anemia: Baseline hemoglobin about 9-10.  Drop in hemoglobin secondary to multiple procedures, hospitalizations.  Will also check iron levels, B12 levels.  Replace if her iron level is low.  Continue to closely monitor.  Transfuse for less than 7 or if she has to go for the procedure.  Abnormal urine: Urine cultures pending.  Already on broad-spectrum antibiotics.  Morbid obesity with BMI more than 50: Definitely she will benefit with weight loss and lifestyle modification.  Will continue to have discussion once more medically stable.  Mobilize.  Out of bed.  Mobilize in the hallway.  Decrease injectable narcotics use.  Laxatives.  Use NSAIDs and oral pain medications.  Incentive spirometry and deep breathing exercises.   DVT prophylaxis: enoxaparin (LOVENOX) injection 40 mg Start: 10/14/22 1115   Code Status: Full code Family Communication: None at the bedside.   Disposition Plan: Status is: Inpatient Remains inpatient appropriate because: Severe pleuritic chest pain, IV antibiotics.  Inpatient procedures planned.     Consultants:  Pulmonary CT surgery,  IR  Procedures:  Thoracentesis left pleural space 2/19, 500 mL exudative fluid removed.  Antimicrobials:  Rocephin azithromycin 2/16--- Azithromycin, vancomycin and cefepime 2/17--   Subjective:  Patient seen and examined. She is worried about what can be done for remaining fluid on her lungs.  Difficult to interpret her symptoms, on interview and assessment patient does not participate much.  Remains afebrile for last 24 hours.  Leukocytosis has improved.   Objective: Vitals:   10/18/22 2031 10/19/22 0014 10/19/22 0405 10/19/22 0745  BP:  130/86 139/73 (!) 138/95  Pulse: (!) 108 (!) 107 92 98  Resp:  20 20   Temp:  99 F (37.2 C) 99.5 F (37.5 C) 99.2 F (37.3 C)  TempSrc:  Oral Oral Oral  SpO2:   100% 100% 100%  Weight:      Height:        Intake/Output Summary (Last 24 hours) at 10/19/2022 1103 Last data filed at 10/19/2022 0700 Gross per 24 hour  Intake 1400.11 ml  Output 300 ml  Net 1100.11 ml   Filed Weights   10/14/22 0152  Weight: (!) 145 kg    Examination:  General exam: Looks comfortable on approaching to interview.  Flat affect.  Avoids eye contact. Respiratory system: Poor inspiratory effort.  Poor air entry at the left side.  Incisions are clean and dry. SpO2: 100 % O2 Flow Rate (L/min): 4 L/min  Cardiovascular system: S1 & S2 heard, RRR.   Gastrointestinal system: Abdomen is nondistended, soft and nontender. No organomegaly or masses felt. Normal bowel sounds heard.  Obese and pendulous. Central nervous system: Alert and oriented. No focal neurological deficits. Extremities: Symmetric 5 x 5 power. Skin: No rashes, lesions or ulcers    Data Reviewed: I have personally reviewed following labs and imaging studies  CBC: Recent Labs  Lab 10/14/22 0229 10/15/22 0116 10/16/22 0116 10/17/22 0939 10/17/22 1732 10/19/22 0149  WBC 15.9* 22.5* 21.2* 19.6* 15.5* 10.7*  NEUTROABS 12.7*  --  17.1* 16.1*  --  7.2  HGB 10.4* 9.5* 8.7* 7.8* 8.3* 7.3*  HCT 34.5* 31.3* 28.2* 25.0* 27.0* 23.8*  MCV 59.6* 59.3* 58.8* 58.1* 58.3* 58.5*  PLT 299 274 269 291 263 [id number]   Basic Metabolic Panel: Recent Labs  Lab 10/13/22 1459 10/14/22 0229 10/15/22 0116 10/16/22 0116 10/17/22 0939  NA 135 134* 134* 133* 136  K 4.3 4.2 4.2 4.3 3.8  CL 99 99 99 99 99  CO2 26 21* 25 23 23  $ GLUCOSE 89 114* 144* 119* 121*  BUN 9 11 9 8 6  $ CREATININE 0.71 0.77 0.81 0.63 0.65  CALCIUM 9.0 9.1 8.8* 8.6* 8.8*  MG  --   --   --  1.9  --    GFR: Estimated Creatinine Clearance: 152.8 mL/min (by C-G formula based on SCr of 0.65 mg/dL). Liver Function Tests: Recent Labs  Lab 10/14/22 0850 10/16/22 0116 10/17/22 0939  AST 19 17 27  $ ALT 18 18 23  $ ALKPHOS 77 81 88  BILITOT 0.6 0.6 0.7   PROT 7.2 7.1 7.2  ALBUMIN 3.1* 2.8* 2.5*   No results for input(s): "LIPASE", "AMYLASE" in the last 168 hours. No results for input(s): "AMMONIA" in the last 168 hours. Coagulation Profile: No results for input(s): "INR", "PROTIME" in the last 168 hours. Cardiac Enzymes: No results for input(s): "CKTOTAL", "CKMB", "CKMBINDEX", "TROPONINI" in the last 168 hours. BNP (last 3 results) No results for input(s): "PROBNP" in the last 8760 hours. HbA1C: No results for input(s): "HGBA1C" in the last 72 hours. CBG: No results for input(s): "GLUCAP" in the last 168 hours. Lipid Profile: No results for input(s): "CHOL", "HDL", "LDLCALC", "TRIG", "CHOLHDL", "LDLDIRECT" in the last 72 hours. Thyroid Function Tests: No results for input(s): "TSH", "T4TOTAL", "FREET4", "T3FREE", "THYROIDAB" in the last 72 hours. Anemia Panel: No results for input(s): "VITAMINB12", "FOLATE", "FERRITIN", "TIBC", "IRON", "RETICCTPCT" in the last 72 hours. Sepsis Labs: Recent Labs  Lab 10/14/22 0912 10/14/22 1709 10/15/22 0116  PROCALCITON  --  <0.10 0.15  LATICACIDVEN 1.1 0.9  --     Recent Results (from the past 240 hour(s))  Resp panel by RT-PCR (RSV, Flu A&B, Covid) Anterior Nasal Swab     Status: None  Collection Time: 10/14/22  3:32 AM   Specimen: Anterior Nasal Swab  Result Value Ref Range Status   SARS Coronavirus 2 by RT PCR NEGATIVE NEGATIVE Final   Influenza A by PCR NEGATIVE NEGATIVE Final   Influenza B by PCR NEGATIVE NEGATIVE Final    Comment: (NOTE) The Xpert Xpress SARS-CoV-2/FLU/RSV plus assay is intended as an aid in the diagnosis of influenza from Nasopharyngeal swab specimens and should not be used as a sole basis for treatment. Nasal washings and aspirates are unacceptable for Xpert Xpress SARS-CoV-2/FLU/RSV testing.  Fact Sheet for Patients: EntrepreneurPulse.com.au  Fact Sheet for Healthcare Providers: IncredibleEmployment.be  This test is  not yet approved or cleared by the [place] FDA and has been authorized for detection and/or diagnosis of SARS-CoV-2 by FDA under an Emergency Use Authorization (EUA). This EUA will remain in effect (meaning this test can be used) for the duration of the COVID-19 declaration under Section 564(b)(1) of the Act, 21 U.S.C. section 360bbb-3(b)(1), unless the authorization is terminated or revoked.     Resp Syncytial Virus by PCR NEGATIVE NEGATIVE Final    Comment: (NOTE) Fact Sheet for Patients: EntrepreneurPulse.com.au  Fact Sheet for Healthcare Providers: IncredibleEmployment.be  This test is not yet approved or cleared by the [place] FDA and has been authorized for detection and/or diagnosis of SARS-CoV-2 by FDA under an Emergency Use Authorization (EUA). This EUA will remain in effect (meaning this test can be used) for the duration of the COVID-19 declaration under Section 564(b)(1) of the Act, 21 U.S.C. section 360bbb-3(b)(1), unless the authorization is terminated or revoked.  Performed at [facility], [place] [address] [place], [place] [address]   Blood culture (routine x 2)     Status: None   Collection Time: 10/14/22  7:11 AM   Specimen: BLOOD  Result Value Ref Range Status   Specimen Description BLOOD RIGHT ANTECUBITAL  Final   Special Requests   Final    BOTTLES DRAWN AEROBIC AND ANAEROBIC Blood Culture results may not be optimal due to an inadequate volume of blood received in culture bottles   Culture   Final    NO GROWTH 5 DAYS Performed at [facility], [place] [address], [place], [place] [address]    Report Status 10/19/2022 FINAL  Final  Blood culture (routine x 2)     Status: None   Collection Time: 10/14/22  5:09 PM   Specimen: BLOOD LEFT HAND  Result Value Ref Range Status   Specimen Description BLOOD LEFT HAND  Final   Special Requests   Final    BOTTLES DRAWN AEROBIC AND ANAEROBIC Blood Culture  adequate volume   Culture   Final    NO GROWTH 5 DAYS Performed at [facility], [place] [address], [place], [place] [address]    Report Status 10/19/2022 FINAL  Final  MRSA Next Gen by PCR, Nasal     Status: None   Collection Time: 10/15/22  8:32 AM   Specimen: Nasal Mucosa; Nasal Swab  Result Value Ref Range Status   MRSA by PCR Next Gen NOT DETECTED NOT DETECTED Final    Comment: (NOTE) The GeneXpert MRSA Assay (FDA approved for NASAL specimens only), is one component of a comprehensive MRSA colonization surveillance program. It is not intended to diagnose MRSA infection nor to guide or monitor treatment for MRSA infections. Test performance is not FDA approved in patients less than 64 years old. Performed at [facility], [place]  [address] [place], [place] [address]   Expectorated Sputum Assessment w Gram Stain, Rflx to Resp Cult     Status: None   Collection Time: 10/15/22  8:40 PM   Specimen: Sputum  Result Value Ref Range Status   Specimen Description SPUTUM  Final   Special Requests NONE  Final   Sputum evaluation   Final    THIS SPECIMEN IS ACCEPTABLE FOR SPUTUM CULTURE Performed at [facility], [place] [address], [place], [place] [address]    Report Status 10/17/2022 FINAL  Final  Culture, Respiratory w Gram Stain     Status: None   Collection Time: 10/15/22  8:40 PM   Specimen: SPU  Result Value Ref Range Status   Specimen Description SPUTUM  Final   Special Requests NONE Reflexed from [id number]  Final   Gram Stain   Final    MODERATE WBC PRESENT,BOTH PMN AND MONONUCLEAR RARE GRAM POSITIVE COCCI IN PAIRS    Culture   Final    RARE Normal respiratory flora-no Staph aureus or Pseudomonas seen Performed at [facility], [place] [address], [place], [place] [address]    Report Status 10/18/2022 FINAL  Final  Body fluid culture w Gram Stain     Status: None (Preliminary result)   Collection Time: 10/17/22 12:32 PM   Specimen: Lung, Left; Pleural Fluid   Result Value Ref Range Status   Specimen Description FLUID  Final   Special Requests  PLEURAL , LEFT LUNG  Final   Gram Stain   Final    WBC PRESENT,BOTH PMN AND MONONUCLEAR NO ORGANISMS SEEN CYTOSPIN SMEAR    Culture   Final    NO GROWTH < 24 HOURS Performed at [facility], [place] [address] [place],  [address]    Report Status PENDING  Incomplete         Radiology Studies: DG Chest 2 View  Result Date: 10/19/2022 CLINICAL DATA:  32 year old female with left pleural effusion. EXAM: CHEST - 2 VIEW COMPARISON:  10/18/2022, 10/14/2022 FINDINGS: Similar appearing mild rightward mediastinal shift with obscuration of the left aspect of the cardiomediastinal silhouette, unchanged. Similar appearance of large left pleural effusion. Similar to slightly worsening diffuse hazy bibasilar opacities. No acute osseous abnormality. IMPRESSION: 1. Similar appearing large left pleural effusion with associated left basilar atelectasis. 2. Slight interval increased hazy opacities about the right lower lobe as could be seen with worsening pneumonia, atelectasis, edema, or technical factors due to positioning. Electronically Signed   By: Ruthann Cancer M.D.   On: 10/19/2022 07:56   DG CHEST PORT 1 VIEW  Result Date: 10/18/2022 CLINICAL DATA:  Pneumonia EXAM: PORTABLE CHEST 1 VIEW COMPARISON:  10/17/2022, CT 10/14/2022 FINDINGS: Residual moderate large left effusion appears slightly increased. Worsened airspace disease at left lung base. Slightly improved aeration of right thorax. Obscured cardiomediastinal silhouette. No pneumothorax IMPRESSION: Slightly increased moderate to large left effusion with worsened airspace disease at the left lung base. Slightly improved aeration of the right thorax. Electronically Signed   By: Donavan Foil M.D.   On: 10/18/2022 17:05   IR THORACENTESIS ASP PLEURAL SPACE W/IMG GUIDE  Result Date: 10/17/2022 INDICATION: Patient s/p left VATS and wedge resection  10/06/22. Patient presents today with a left pleural effusion. Interventional radiology asked to perform a diagnostic and therapeutic thoracentesis. EXAM: ULTRASOUND GUIDED THORACENTESIS MEDICATIONS: 1% lidocaine 20 mL COMPLICATIONS: SIR LEVEL B - Normal therapy, CBC/labs ordered. Spleen mistaken for pleural effusion initially. PROCEDURE: An ultrasound guided thoracentesis  was thoroughly discussed with the patient and questions answered. The benefits, risks, alternatives and complications were also discussed. The patient understands and wishes to proceed with the procedure. Written consent was obtained. Ultrasound was performed to localize and mark an adequate pocket of fluid in the left chest. The area was then prepped and draped in the normal sterile fashion. 1% Lidocaine was used for local anesthesia. Under ultrasound guidance a 19 gauge 10-cm Yueh catheter was introduced inadvertently into the spleen. The catheter was removed. Ultrasound evaluation with color doppler demonstrated no evidence of surrounding hematoma or other complicating features. A more superior location in the left posterior thorax was selected for thoracentesis. Subdermal Local anesthesia was applied with 1% lidocaine. Deeper local anesthetic was administered ultrasound guidance adjacent to the pleura. Under direct ultrasound visualization, a 10 cm Yueh needle was directed into the pleural space. Thoracentesis was performed. The catheter was removed and a dressing applied. FINDINGS: A total of approximately 500 ml of clear yellow fluid was removed. Samples were sent to the laboratory as requested by the clinical team. IMPRESSION: Successful ultrasound guided left thoracentesis yielding 500 mL of pleural fluid. Read by: Soyla Dryer, NP Electronically Signed   By: Ruthann Cancer M.D.   On: 10/17/2022 13:50   DG Chest 1 View  Result Date: 10/17/2022 CLINICAL DATA:  [id number] S/P thoracentesis [id number] EXAM: CHEST  1 VIEW COMPARISON:  Radiograph  10/17/2022 FINDINGS: Unchanged cardiomegaly. Decreased but persistent left pleural effusion after thoracentesis. No evidence of pneumothorax. Slight improved aeration in the left lower lung. Persistent right lower lung airspace disease. Bones are unchanged. IMPRESSION: Decreased left pleural effusion and slight improved aeration in the left lower lung after thoracentesis. No evidence of pneumothorax. Persistent right lower lung airspace disease. Electronically Signed   By: Maurine Simmering M.D.   On: 10/17/2022 12:55        Scheduled Meds:  docusate sodium  100 mg Oral BID   enoxaparin (LOVENOX) injection  40 mg Subcutaneous Q24H   gabapentin  300 mg Oral TID   guaiFENesin  600 mg Oral BID   ketorolac  30 mg Intravenous Q6H   miconazole nitrate   Topical BID   polyethylene glycol  17 g Oral Daily   sodium chloride flush  3 mL Intravenous Q12H   Continuous Infusions:  ceFEPime (MAXIPIME) IV 2 g (10/19/22 0505)   vancomycin 1,500 mg (10/19/22 0804)      LOS: 5 days    Time spent: 35 minutes    Barb Merino, MD [facility] Pager [phone]

## 2022-10-19 NOTE — Progress Notes (Signed)
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   Admitted due to L side chest pain, fever, cough and orthopnea found to have L pleural effusion.   OT comments  Pt progressing towards goals this session needing supervision for LB dressing and set up A for standing grooming task at sink. Pt supervision for bed mobility, and min guard A for transfers without AD. Pt with x1 LOB episode, however able to self correct. VSS on 4L O2 throughout session. Pt presenting with impairments listed below, will follow acutely. Recommend HHOT at d/c.   Recommendations for follow up therapy are one component of a multi-disciplinary discharge planning process, led by the attending physician.  Recommendations may be updated based on patient status, additional functional criteria and insurance authorization.    Follow Up Recommendations  Home health OT     Assistance Recommended at Discharge Intermittent Supervision/Assistance  Patient can return home with the following  Direct supervision/assist for medications management;Direct supervision/assist for financial management;Assist for transportation;Help with stairs or ramp for entrance;Assistance with cooking/housework;A little help with walking and/or transfers;A little help with bathing/dressing/bathroom   Equipment Recommendations  BSC/3in1    Recommendations for Other Services      Precautions / Restrictions Precautions Precautions: Fall Precaution Comments: watch HR, SpO2, RR Restrictions Weight Bearing Restrictions: No       Mobility Bed Mobility Overal bed mobility: Needs Assistance       Supine to sit:  Supervision Sit to supine: Supervision        Transfers Overall transfer level: Needs assistance Equipment used: None Transfers: Sit to/from Stand Sit to Stand: Min guard           General transfer comment: mild LOB x1 while standing at sink, able to self correct in standing     Balance Overall balance assessment: Needs assistance Sitting-balance support: Feet supported Sitting balance-Leahy Scale: Good     Standing balance support: Bilateral upper extremity supported, During functional activity Standing balance-Leahy Scale: Fair                             ADL either performed or assessed with clinical judgement   ADL Overall ADL's : Needs assistance/impaired     Grooming: Set up;Oral care Grooming Details (indicate cue type and reason): brushing teeth standing at sink             Lower Body Dressing: Supervision/safety Lower Body Dressing Details (indicate cue type and reason): donning socks using figure 4             Functional mobility during ADLs: Min guard      Extremity/Trunk Assessment Upper Extremity Assessment Upper Extremity Assessment: Overall WFL for tasks assessed   Lower Extremity Assessment Lower Extremity Assessment: Defer to PT evaluation        Vision   Vision Assessment?: No apparent visual deficits   Perception Perception Perception: Not tested   Praxis Praxis Praxis: Not tested    Cognition Arousal/Alertness: Awake/alert Behavior During Therapy: [facility] for tasks assessed/performed  General Comments: drowsy initially, however reports she had just woken upon OT arrival        Exercises      Shoulder Instructions       General Comments VSS on 4L O2    Pertinent Vitals/ Pain       Pain Assessment Pain Assessment: No/denies pain  Home Living                                          Prior Functioning/Environment               Frequency  Min 2X/week        Progress Toward Goals  OT Goals(current goals can now be found in the care plan section)  Progress towards OT goals: Progressing toward goals  Acute Rehab OT Goals Patient Stated Goal: none stated OT Goal Formulation: With patient Time For Goal Achievement: 10/30/22 Potential to Achieve Goals: Good ADL Goals Pt Will Perform Lower Body Bathing: with min guard assist;sit to/from stand;sitting/lateral leans;with adaptive equipment Pt Will Perform Lower Body Dressing: with min guard assist;with adaptive equipment;sitting/lateral leans;sit to/from stand Pt Will Transfer to Toilet: with min guard assist;ambulating;grab bars;regular height toilet Pt Will Perform Toileting - Clothing Manipulation and hygiene: with min guard assist;with adaptive equipment;sitting/lateral leans;sit to/from stand Additional ADL Goal #1: Patient will be able to come up with 3 strategies to manage anxiety in preparation for movement or when she is feeling short of breath.  Plan Discharge plan remains appropriate;Frequency remains appropriate    Co-evaluation                 AM-PAC OT "6 Clicks" Daily Activity     Outcome Measure   Help from another person eating meals?: A Little Help from another person taking care of personal grooming?: A Little Help from another person toileting, which includes using toliet, bedpan, or urinal?: A Little Help from another person bathing (including washing, rinsing, drying)?: A Little Help from another person to put on and taking off regular upper body clothing?: A Little Help from another person to put on and taking off regular lower body clothing?: A Little 6 Click Score: 18    End of Session Equipment Utilized During Treatment: Oxygen (4L)  OT Visit Diagnosis: Unsteadiness on feet (R26.81);Muscle weakness (generalized) (M62.81);Pain Pain - Right/Left: Left   Activity Tolerance Patient tolerated treatment well   Patient  Left in bed;with call bell/phone within reach   Nurse Communication Mobility status        Time: [id number] OT Time Calculation (min): 19 min  Charges: OT General Charges $OT Visit: 1 Visit OT Treatments $Self Care/Home Management : 8-22 mins  Renaye Rakers, OTD, OTR/L SecureChat Preferred Acute Rehab [phone] - [place] 10/19/2022, 2:45 PM

## 2022-10-19 NOTE — Progress Notes (Signed)
Mobility Specialist Progress Note   10/19/22 1529  Mobility  Activity Ambulated independently to bathroom;Dangled on edge of bed  Level of Assistance Modified independent, requires aide device or extra time  Assistive Device None  Distance Ambulated (ft) 15 ft  Range of Motion/Exercises Active;All extremities  Activity Response Tolerated fair    Pre Ambulation: HR 101 During Ambulation: HR 111 Post Ambulation: HR 100,  SpO2 100% 4LO2  Patient received in supine and agreeable. Ambulated to and from bathroom mod I with steady gait. Deferred hallway ambulation secondary to fatigue and dyspnea. Patient removed O2 and oximeter prior to writers entry into room so unable to obtain saturation reading during ambulation. Returned to supine without incident but was 3/4 dyspneic, oxygen saturation reading 100%. Was left in bed with all needs met, call bell in reach.   Julie King, BS EXP Mobility Specialist Please contact via SecureChat or Rehab office at [phone]

## 2022-10-19 NOTE — H&P (View-Only) (Signed)
      [address]       [place] [address]             [phone]           Subjective: Patient sitting in chair this am. She is wondering what the next step is.  Objective: Vital signs in last 24 hours: Temp:  [99 F (37.2 C)-99.6 F (37.6 C)] 99.5 F (37.5 C) (02/21 0405) Pulse Rate:  [92-114] 92 (02/21 0405) Cardiac Rhythm: Sinus tachycardia (02/20 2006) Resp:  [17-20] 20 (02/21 0405) BP: (111-139)/(67-86) 139/73 (02/21 0405) SpO2:  [97 %-100 %] 100 % (02/21 0405)   Intake/Output from previous day: 02/20 0701 - 02/21 0700 In: 1284.7 [IV Piggyback:1284.7] Out: -    Physical Exam:  Cardiovascular: Tachycardic Pulmonary: Diminished breath sounds on left Abdomen: Soft, obese, non tender, bowel sounds present. Extremities: Mild bilateral lower extremity edema. Wounds: Clean and dry.  No erythema or signs of infection. Left chest tube wound with superficial skin edge separation.    Lab Results: CBC: Recent Labs    10/17/22 1732 10/19/22 0149  WBC 15.5* 10.7*  HGB 8.3* 7.3*  HCT 27.0* 23.8*  PLT 263 244    BMET:  Recent Labs    10/17/22 0939  NA 136  K 3.8  CL 99  CO2 23  GLUCOSE 121*  BUN 6  CREATININE 0.65  CALCIUM 8.8*     PT/INR: No results for input(s): "LABPROT", "INR" in the last 72 hours. ABG:  INR: Will add last result for INR, ABG once components are confirmed Will add last 4 CBG results once components are confirmed  Assessment/Plan:  1. CV - ST;some of which is related to pain 2.  Pulmonary - S/p left thoracentesis with 500 ml removed. On 4 liters of oxygen via [place]. Wean as able. CXR this am shows patient rotated to the left,  Increased in left consolidation (pleural effusion) since CXR after thoracentesis. Will discuss with Dr. Cachet Mccutchen (?IR vs VATS). Encourage incentive spirometer and flutter valve 3. ID-on Vancomycin and Cefepime for PNA. WBC decreased to 10,700.Gram stain left pleural fluid shows no  organisms. Culture shows no growth 24 hours. 4. On Lovenox for DVT prophylaxis 5. Anemia-H and H this am decreased to 7.3 and 23.8. Per primary 6. Regarding pain control, on scheduled Neurontin tid, Dilaudid PRN, Toradol PRN.  Donielle M ZimmermanPA-C 10/19/2022,6:58 AM  Patient seen and examined, agree with above Looks better than she did a couple of days ago but SOB with minimal activity CXR stil shows large parapneumonic effusion, likely loculated. I think the best option for management of the effusion is to take her back to OR for left VATS to drain the effusion and possibly decorticate the lung.  I discussed the procedure with her and her mother (by phone).  She is agreeable.  Will plan OR Friday  Ramonita Koenig C. Jamia Hoban, MD [facility] [phone]  

## 2022-10-19 NOTE — Progress Notes (Signed)
Sutures removed from previous chest tube per order

## 2022-10-19 NOTE — Progress Notes (Signed)
Pharmacy Antibiotic Note  Julie King is a 32 y.o. female admitted on 10/14/2022 with pneumonia.  Pharmacy has been consulted for vancomycin and cefepime dosing.  Vanc peak 16, trough 6 >> AUC 250, below goal.   Vanc peak = 18, vanc trough 11 >> AUC 410. Therapeutic so we will continue with the current dose.  Plan: Cont vancomycin 1546m IV Q8H Continue cefepime 2g IV Q8H.  Height: 5' 7"$  (170.2 cm) Weight: (!) 145 kg (319 lb 10.7 oz) IBW/kg (Calculated) : 61.6  Temp (24hrs), Avg:99.5 F (37.5 C), Min:99 F (37.2 C), Max:100 F (37.8 C)  Recent Labs  Lab 10/13/22 1459 10/14/22 0229 10/14/22 0912 10/14/22 1709 10/15/22 0116 10/16/22 0116 10/17/22 0939 10/17/22 1732 10/17/22 2357 10/19/22 0149 10/19/22 1205 10/19/22 1459  WBC 15.0* 15.9*  --   --  22.5* 21.2* 19.6* 15.5*  --  10.7*  --   --   CREATININE 0.71 0.77  --   --  0.81 0.63 0.65  --   --   --   --   --   LATICACIDVEN  --   --  1.1 0.9  --   --   --   --   --   --   --   --   VANCOTROUGH  --   --   --   --   --   --   --   --  6*  --   --  11*  VANCOPEAK  --   --   --   --   --   --   --  16*  --   --  18*  --      Estimated Creatinine Clearance: 152.8 mL/min (by C-G formula based on SCr of 0.65 mg/dL).    Allergies  Allergen Reactions   Percocet [Oxycodone-Acetaminophen] Hives, Itching and Nausea Only   Pineapple Itching    Fresh pineapples; pineapples from a can is okay    2/16 azithromycin >> (2/18) 2/16 CTX >> 2/17 2/17 vancomycin >> 2/17 cefepime >>  MOnnie Boer PharmD, BCIDP, AAHIVP, CPP Infectious Disease Pharmacist 10/19/2022 4:21 PM

## 2022-10-20 ENCOUNTER — Inpatient Hospital Stay (HOSPITAL_COMMUNITY): Payer: Medicaid Other

## 2022-10-20 DIAGNOSIS — R6510 Systemic inflammatory response syndrome (SIRS) of non-infectious origin without acute organ dysfunction: Secondary | ICD-10-CM | POA: Diagnosis not present

## 2022-10-20 LAB — CBC WITH DIFFERENTIAL/PLATELET
Abs Immature Granulocytes: 0 10*3/uL (ref 0.00–0.07)
Basophils Absolute: 0.2 10*3/uL — ABNORMAL HIGH (ref 0.0–0.1)
Basophils Relative: 2 %
Eosinophils Absolute: 0.7 10*3/uL — ABNORMAL HIGH (ref 0.0–0.5)
Eosinophils Relative: 6 %
HCT: 23.6 % — ABNORMAL LOW (ref 36.0–46.0)
Hemoglobin: 7.2 g/dL — ABNORMAL LOW (ref 12.0–15.0)
Lymphocytes Relative: 17 %
Lymphs Abs: 1.9 10*3/uL (ref 0.7–4.0)
MCH: 17.6 pg — ABNORMAL LOW (ref 26.0–34.0)
MCHC: 30.5 g/dL (ref 30.0–36.0)
MCV: 57.7 fL — ABNORMAL LOW (ref 80.0–100.0)
Monocytes Absolute: 0.3 10*3/uL (ref 0.1–1.0)
Monocytes Relative: 3 %
Neutro Abs: 8.1 10*3/uL — ABNORMAL HIGH (ref 1.7–7.7)
Neutrophils Relative %: 72 %
Platelets: 281 10*3/uL (ref 150–400)
RBC: 4.09 MIL/uL (ref 3.87–5.11)
RDW: 20.2 % — ABNORMAL HIGH (ref 11.5–15.5)
WBC: 11.3 10*3/uL — ABNORMAL HIGH (ref 4.0–10.5)
nRBC: 0.5 % — ABNORMAL HIGH (ref 0.0–0.2)
nRBC: 2 /100 WBC — ABNORMAL HIGH

## 2022-10-20 LAB — COMPREHENSIVE METABOLIC PANEL
ALT: 39 U/L (ref 0–44)
AST: 31 U/L (ref 15–41)
Albumin: 2.2 g/dL — ABNORMAL LOW (ref 3.5–5.0)
Alkaline Phosphatase: 91 U/L (ref 38–126)
Anion gap: 9 (ref 5–15)
BUN: 5 mg/dL — ABNORMAL LOW (ref 6–20)
CO2: 24 mmol/L (ref 22–32)
Calcium: 8.4 mg/dL — ABNORMAL LOW (ref 8.9–10.3)
Chloride: 105 mmol/L (ref 98–111)
Creatinine, Ser: 0.64 mg/dL (ref 0.44–1.00)
GFR, Estimated: >60 mL/min (ref >60)
Glucose, Bld: 127 mg/dL — ABNORMAL HIGH (ref 70–99)
Potassium: 3.2 mmol/L — ABNORMAL LOW (ref 3.5–5.1)
Sodium: 138 mmol/L (ref 135–145)
Total Bilirubin: 0.3 mg/dL (ref 0.3–1.2)
Total Protein: 6.4 g/dL — ABNORMAL LOW (ref 6.5–8.1)

## 2022-10-20 LAB — MAGNESIUM: Magnesium: 1.8 mg/dL (ref 1.7–2.4)

## 2022-10-20 LAB — PHOSPHORUS: Phosphorus: 3.2 mg/dL (ref 2.5–4.6)

## 2022-10-20 MED ORDER — POTASSIUM CHLORIDE CRYS ER 20 MEQ PO TBCR
40.0000 meq | EXTENDED_RELEASE_TABLET | Freq: Two times a day (BID) | ORAL | Status: DC
  Administered 2022-10-20 – 2022-10-21 (×3): 40 meq via ORAL
  Filled 2022-10-20 (×3): qty 2

## 2022-10-20 NOTE — Progress Notes (Signed)
Physical Therapy Treatment Patient Details Name: Julie King MRN: [id number] DOB: 05/24/1991 Today's Date: 10/20/2022   History of Present Illness 32 year old with recently diagnosed lingular mass, robotic biopsy 06/2022 negative for malignancy, repeat CT scans with increasing size and hemoptysis. 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   Admitted due to L side chest pain, fever, cough and orthopnea found to have L pleural effusion.    PT Comments    Pt received in recliner, agreeable to therapy session with encouragement, with emphasis on safety with transfers and gait training. Pt with good effort and able to progress to longer household distance gait trial with bari RW and 4L O2 [place], HR to 118 bpm with exertion this date. Pt reports moderate to severe pain to L anterior/posterior trunk throughout and moderate DOE 2/4. Plan for procedure next date, will continue to assess DME needs post-op. Pt continues to benefit from PT services to progress toward functional mobility goals.   Recommendations for follow up therapy are one component of a multi-disciplinary discharge planning process, led by the attending physician.  Recommendations may be updated based on patient status, additional functional criteria and insurance authorization.  Follow Up Recommendations  Home health PT     Assistance Recommended at Discharge Intermittent Supervision/Assistance  Patient can return home with the following A little help with walking and/or transfers;Assistance with cooking/housework;Assist for transportation;Help with stairs or ramp for entrance;A little help with bathing/dressing/bathroom   Equipment Recommendations  None recommended by PT (pending progress post-procedure)    Recommendations for Other Services       Precautions / Restrictions Precautions Precautions: Fall Precaution Comments: watch HR, SpO2, RR Restrictions Weight Bearing Restrictions: No      Mobility  Bed Mobility Overal bed mobility: Needs Assistance             General bed mobility comments: pt received up in recliner/remained in recliner    Transfers Overall transfer level: Needs assistance Equipment used: Rolling walker (2 wheels) (bari width with bil single wheel in front) Transfers: Sit to/from Stand Sit to Stand: Supervision           General transfer comment: from chair<>bari RW    Ambulation/Gait Ambulation/Gait assistance: Supervision Gait Distance (Feet): 200 Feet Assistive device: Rolling walker (2 wheels) (bari) Gait Pattern/deviations: Step-through pattern, Decreased stride length Gait velocity: grossly 0.2-0.4 m/s, variable     General Gait Details: good RW management, mild c/o dizziness toward end of trial. HR 100-118 bpm with exertion/SpO2 WFL on 4L O2 [place].   Stairs             Wheelchair Mobility    Modified Rankin (Stroke Patients Only)       Balance Overall balance assessment: Needs assistance Sitting-balance support: Feet supported Sitting balance-Leahy Scale: Good     Standing balance support: Bilateral upper extremity supported, During functional activity Standing balance-Leahy Scale: Fair                              Cognition Arousal/Alertness: Awake/alert Behavior During Therapy: WFL for tasks assessed/performed Overall Cognitive Status: Within Functional Limits for tasks assessed                                 General Comments: Anxious due to pain prior to participating but agreeable with encouragement.  Exercises Other Exercises Other Exercises: encouraged IS use hourly    General Comments General comments (skin integrity, edema, etc.): see gait comments      Pertinent Vitals/Pain Pain Assessment Pain Assessment: Faces Pain Score: 6  Pain Location: L chest and back Pain Descriptors / Indicators: Discomfort, Grimacing, Sharp, Stabbing, Pressure Pain  Intervention(s): Monitored during session, Premedicated before session, Repositioned     PT Goals (current goals can now be found in the care plan section) Acute Rehab PT Goals Patient Stated Goal: to feel better and go home/back to work PT Goal Formulation: With patient Time For Goal Achievement: 10/28/22 Progress towards PT goals: Progressing toward goals    Frequency    Min 3X/week      PT Plan Current plan remains appropriate       AM-PAC PT "6 Clicks" Mobility   Outcome Measure  Help needed turning from your back to your side while in a flat bed without using bedrails?: None Help needed moving from lying on your back to sitting on the side of a flat bed without using bedrails?: A Little Help needed moving to and from a bed to a chair (including a wheelchair)?: A Little Help needed standing up from a chair using your arms (e.g., wheelchair or bedside chair)?: A Little Help needed to walk in hospital room?: A Little Help needed climbing 3-5 steps with a railing? : A Lot 6 Click Score: 18    End of Session Equipment Utilized During Treatment: Oxygen Activity Tolerance: Patient tolerated treatment well Patient left: in chair;with call bell/phone within reach;Other (comment) (pt requests chair alarm off, pt A&O and instructed to use call bell prior to getting up due to multiple lines/fall risk, pt agreeable) Nurse Communication: Mobility status PT Visit Diagnosis: Muscle weakness (generalized) (M62.81);Difficulty in walking, not elsewhere classified (R26.2)     Time: [id number] PT Time Calculation (min) (ACUTE ONLY): 26 min  Charges:  $Gait Training: 23-37 mins                     Quinlynn Cuthbert P., PTA Acute Rehabilitation Services Secure Chat Preferred 9a-5:30pm Office: [place] 10/20/2022, 5:54 PM

## 2022-10-20 NOTE — Progress Notes (Signed)
PROGRESS NOTE    Julie King  [id number] DOB: 1990-10-25 DOA: 10/14/2022 PCP: Hattie Perch, MD    Brief Narrative:  32 year old with recently diagnosed lingular mass, robotic biopsy 06/2022 negative for malignancy, repeat CT scans with increasing size and hemoptysis.   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  Pathology negative for malignancy and showed inflammation with focal necrosis, fibrosis and pneumocyte hyperplasia.  Discharge home after surgery.    Presented back to the emergency room with 3 days of worsening left-sided chest pain, subjective fever, cough, nausea and orthopnea.  CT angiogram with postsurgical changes small left pleural effusion. Patient remained in the hospital, she was persistently febrile and leukocytosis. 2/19, repeat chest x-ray with increased effusion on the left side.  IR guided thoracentesis 500 mL removed, no growth so far.  Clinically improving with improved fever and improved leukocytosis counts but persistent large left-sided pleural effusion and hypoxemia.  Scheduled for VATS 2/23.   Assessment & plan of care:   Left lower lobe pneumonia, complicated parapneumonic effusion with pleurisy, Postoperative pleural space infection. Sepsis present on admission.  Currently on broad-spectrum antibiotics with vancomycin and cefepime.  Completed azithromycin for 3 days.   Continue chest physiotherapy, incentive spirometry, deep breathing exercises, sputum induction, mucolytic's and bronchodilators. Sputum cultures 2/17, respiratory flora. Blood cultures, negative so far. Surgical culture from 2/8 with Streptococcus. Fluid culture from 2/19, no growth so far.  Minimal leukocytes.  LDH 600. Supplemental oxygen to keep saturations more than 90%. Encourage oral pain medications and NSAIDs, will taper off IV narcotics. CT angiogram negative for PE, DVT studies negative. Pulmonary and CT surgery following. Fever curve improving,  leukocytosis improving. Repeat x-rays with persistent loculated left-sided pleural effusion and oxygen requirement.  Postoperative pleural effusion. For VATS tomorrow.   Acute on chronic microcytic anemia: Baseline hemoglobin about 9-10.  Drop in hemoglobin secondary to multiple procedures, hospitalizations.  B12 is adequate.  Ferritin level is adequate.   Hemoglobin 7.2.  Will benefit with 1 unit PRBC transfusion perioperative.  Will defer to surgery. Patient has consented for blood transfusion if needed.  Abnormal urine: Urine cultures collected 2/18.  Never reported.  Already on broad-spectrum antibiotics.  Hypokalemia: Replace.  Magnesium and phosphorus are adequate.  Morbid obesity with BMI more than 50: Definitely she will benefit with weight loss and lifestyle modification.  Will continue to have discussion once more medically stable.  Mobilize.  Out of bed.  Mobilize in the hallway.  Decrease injectable narcotics use.  Laxatives.  Use NSAIDs and oral pain medications.  Incentive spirometry and deep breathing exercises.   DVT prophylaxis: enoxaparin (LOVENOX) injection 40 mg Start: 10/14/22 1115   Code Status: Full code Family Communication: None at the bedside.  Multiple discussions with mother on the phone while on rounds. Disposition Plan: Status is: Inpatient Remains inpatient appropriate because: Severe pleuritic chest pain, IV antibiotics.  Inpatient procedures planned.     Consultants:  Pulmonary CT surgery,  IR  Procedures:  Thoracentesis left pleural space 2/19, 500 mL exudative fluid removed.  Antimicrobials:  Rocephin azithromycin 2/16--- Azithromycin, vancomycin and cefepime 2/17--   Subjective:  Patient seen and examined.  She had some sweating overnight with low-grade fever 99.5.  Pain is much controlled.  Heart rate is much controlled.  Overall stabilizing.  Looking forward to get rid of fluid from her lungs.  Looking forward for surgery  tomorrow.   Objective: Vitals:   10/20/22 0258 10/20/22 0300 10/20/22 0910 10/20/22  0919  BP:  131/81  (!) 140/77  Pulse:  96  (!) 108  Resp:  20  20  Temp:  99.3 F (37.4 C)  99.5 F (37.5 C)  TempSrc:  Oral  Oral  SpO2:  100% 95% 97%  Weight: (!) 145.9 kg     Height:        Intake/Output Summary (Last 24 hours) at 10/20/2022 1030 Last data filed at 10/20/2022 1000 Gross per 24 hour  Intake 1520 ml  Output 500 ml  Net 1020 ml    Filed Weights   10/14/22 0152 10/20/22 0258  Weight: (!) 145 kg (!) 145.9 kg    Examination:  General exam: Looking comfortable today.  Able to talk in full sentences.  Appropriately anxious. Respiratory system: Improved inspiratory effort.  Poor air entry at the left side.  Incisions are clean and dry. SpO2: 97 % O2 Flow Rate (L/min): 4 L/min  Cardiovascular system: S1 & S2 heard, RRR.   Gastrointestinal system: Abdomen is nondistended, soft and nontender. No organomegaly or masses felt. Normal bowel sounds heard.  Obese and pendulous. Central nervous system: Alert and oriented. No focal neurological deficits. Extremities: Symmetric 5 x 5 power. Skin: No rashes, lesions or ulcers    Data Reviewed: I have personally reviewed following labs and imaging studies  CBC: Recent Labs  Lab 10/14/22 0229 10/15/22 0116 10/16/22 0116 10/17/22 0939 10/17/22 1732 10/19/22 0149 10/20/22 0716  WBC 15.9*   < > 21.2* 19.6* 15.5* 10.7* 11.3*  NEUTROABS 12.7*  --  17.1* 16.1*  --  7.2 PENDING  HGB 10.4*   < > 8.7* 7.8* 8.3* 7.3* 7.2*  HCT 34.5*   < > 28.2* 25.0* 27.0* 23.8* 23.6*  MCV 59.6*   < > 58.8* 58.1* 58.3* 58.5* 57.7*  PLT 299   < > 269 291 263 244 281   < > = values in this interval not displayed.    Basic Metabolic Panel: Recent Labs  Lab 10/14/22 0229 10/15/22 0116 10/16/22 0116 10/17/22 0939 10/20/22 0716  NA 134* 134* 133* 136 138  K 4.2 4.2 4.3 3.8 3.2*  CL 99 99 99 99 105  CO2 21* 25 23 23 24  $ GLUCOSE 114* 144* 119*  121* 127*  BUN 11 9 8 6 $ 5*  CREATININE 0.77 0.81 0.63 0.65 0.64  CALCIUM 9.1 8.8* 8.6* 8.8* 8.4*  MG  --   --  1.9  --  1.8  PHOS  --   --   --   --  3.2    GFR: Estimated Creatinine Clearance: 153.3 mL/min (by C-G formula based on SCr of 0.64 mg/dL). Liver Function Tests: Recent Labs  Lab 10/14/22 0850 10/16/22 0116 10/17/22 0939 10/20/22 0716  AST 19 17 27 31  $ ALT 18 18 23 $ 39  ALKPHOS 77 81 88 91  BILITOT 0.6 0.6 0.7 0.3  PROT 7.2 7.1 7.2 6.4*  ALBUMIN 3.1* 2.8* 2.5* 2.2*    No results for input(s): "LIPASE", "AMYLASE" in the last 168 hours. No results for input(s): "AMMONIA" in the last 168 hours. Coagulation Profile: No results for input(s): "INR", "PROTIME" in the last 168 hours. Cardiac Enzymes: No results for input(s): "CKTOTAL", "CKMB", "CKMBINDEX", "TROPONINI" in the last 168 hours. BNP (last 3 results) No results for input(s): "PROBNP" in the last 8760 hours. HbA1C: No results for input(s): "HGBA1C" in the last 72 hours. CBG: No results for input(s): "GLUCAP" in the last 168 hours. Lipid Profile: No results for input(s): "CHOL", "HDL", "  [place]", "TRIG", "CHOLHDL", "LDLDIRECT" in the last 72 hours. Thyroid Function Tests: No results for input(s): "TSH", "T4TOTAL", "FREET4", "T3FREE", "THYROIDAB" in the last 72 hours. Anemia Panel: Recent Labs    10/19/22 1205  VITAMINB12 206  FERRITIN 104  TIBC 209*  IRON 16*   Sepsis Labs: Recent Labs  Lab 10/14/22 0912 10/14/22 1709 10/15/22 0116  PROCALCITON  --  <0.10 0.15  LATICACIDVEN 1.1 0.9  --      Recent Results (from the past 240 hour(s))  Resp panel by RT-PCR (RSV, Flu A&B, Covid) Anterior Nasal Swab     Status: None   Collection Time: 10/14/22  3:32 AM   Specimen: Anterior Nasal Swab  Result Value Ref Range Status   SARS Coronavirus 2 by RT PCR NEGATIVE NEGATIVE Final   Influenza A by PCR NEGATIVE NEGATIVE Final   Influenza B by PCR NEGATIVE NEGATIVE Final    Comment: (NOTE) The Xpert Xpress  SARS-CoV-2/FLU/RSV plus assay is intended as an aid in the diagnosis of influenza from Nasopharyngeal swab specimens and should not be used as a sole basis for treatment. Nasal washings and aspirates are unacceptable for Xpert Xpress SARS-CoV-2/FLU/RSV testing.  Fact Sheet for Patients: EntrepreneurPulse.com.au  Fact Sheet for Healthcare Providers: IncredibleEmployment.be  This test is not yet approved or cleared by the [place] FDA and has been authorized for detection and/or diagnosis of SARS-CoV-2 by FDA under an Emergency Use Authorization (EUA). This EUA will remain in effect (meaning this test can be used) for the duration of the COVID-19 declaration under Section 564(b)(1) of the Act, 21 U.S.C. section 360bbb-3(b)(1), unless the authorization is terminated or revoked.     Resp Syncytial Virus by PCR NEGATIVE NEGATIVE Final    Comment: (NOTE) Fact Sheet for Patients: EntrepreneurPulse.com.au  Fact Sheet for Healthcare Providers: IncredibleEmployment.be  This test is not yet approved or cleared by the [place] FDA and has been authorized for detection and/or diagnosis of SARS-CoV-2 by FDA under an Emergency Use Authorization (EUA). This EUA will remain in effect (meaning this test can be used) for the duration of the COVID-19 declaration under Section 564(b)(1) of the Act, 21 U.S.C. section 360bbb-3(b)(1), unless the authorization is terminated or revoked.  Performed at [facility], [place] [address], [place], [place] [address]   Blood culture (routine x 2)     Status: None   Collection Time: 10/14/22  7:11 AM   Specimen: BLOOD  Result Value Ref Range Status   Specimen Description BLOOD RIGHT ANTECUBITAL  Final   Special Requests   Final    BOTTLES DRAWN AEROBIC AND ANAEROBIC Blood Culture results may not be optimal due to an inadequate volume of blood received in culture bottles    Culture   Final    NO GROWTH 5 DAYS Performed at [facility], [place] [address], [place], [place] [address]    Report Status 10/19/2022 FINAL  Final  Blood culture (routine x 2)     Status: None   Collection Time: 10/14/22  5:09 PM   Specimen: BLOOD LEFT HAND  Result Value Ref Range Status   Specimen Description BLOOD LEFT HAND  Final   Special Requests   Final    BOTTLES DRAWN AEROBIC AND ANAEROBIC Blood Culture adequate volume   Culture   Final    NO GROWTH 5 DAYS Performed at [facility], [place] [address], [place], [place] [address]    Report Status 10/19/2022 FINAL  Final  MRSA Next  Gen by PCR, Nasal     Status: None   Collection Time: 10/15/22  8:32 AM   Specimen: Nasal Mucosa; Nasal Swab  Result Value Ref Range Status   MRSA by PCR Next Gen NOT DETECTED NOT DETECTED Final    Comment: (NOTE) The GeneXpert MRSA Assay (FDA approved for NASAL specimens only), is one component of a comprehensive MRSA colonization surveillance program. It is not intended to diagnose MRSA infection nor to guide or monitor treatment for MRSA infections. Test performance is not FDA approved in patients less than 11 years old. Performed at [facility], [place] [address], [place], [place] [address]   Expectorated Sputum Assessment w Gram Stain, Rflx to Resp Cult     Status: None   Collection Time: 10/15/22  8:40 PM   Specimen: Sputum  Result Value Ref Range Status   Specimen Description SPUTUM  Final   Special Requests NONE  Final   Sputum evaluation   Final    THIS SPECIMEN IS ACCEPTABLE FOR SPUTUM CULTURE Performed at [facility], [place] [address] [place], [place] [address]    Report Status 10/17/2022 FINAL  Final  Culture, Respiratory w Gram Stain     Status: None   Collection Time: 10/15/22  8:40 PM   Specimen: SPU  Result Value Ref Range Status   Specimen Description SPUTUM  Final   Special Requests NONE Reflexed from [id number]  Final   Gram Stain   Final    MODERATE  WBC PRESENT,BOTH PMN AND MONONUCLEAR RARE GRAM POSITIVE COCCI IN PAIRS    Culture   Final    RARE Normal respiratory flora-no Staph aureus or Pseudomonas seen Performed at [facility], [place] [address], [place], [place] [address]    Report Status 10/18/2022 FINAL  Final  Fungus Culture With Stain     Status: None (Preliminary result)   Collection Time: 10/17/22 12:32 PM   Specimen: Lung, Left; Pleural Fluid  Result Value Ref Range Status   Fungus Stain Final report  Final    Comment: (NOTE) Performed At: [facility] [place], [place] [id number] Rush Farmer MD [id number]    Fungus (Mycology) Culture PENDING  Incomplete   Fungal Source FLUID  Final    Comment: Performed at [facility], [place] [address] [place], [place] [address]  Body fluid culture w Gram Stain     Status: None (Preliminary result)   Collection Time: 10/17/22 12:32 PM   Specimen: Lung, Left; Pleural Fluid  Result Value Ref Range Status   Specimen Description FLUID  Final   Special Requests  PLEURAL , LEFT LUNG  Final   Gram Stain   Final    WBC PRESENT,BOTH PMN AND MONONUCLEAR NO ORGANISMS SEEN CYTOSPIN SMEAR    Culture   Final    NO GROWTH 3 DAYS Performed at [facility], [place] [address] [place], [place] [address]    Report Status PENDING  Incomplete  Fungus Culture Result     Status: None   Collection Time: 10/17/22 12:32 PM  Result Value Ref Range Status   Result 1 Comment  Final    Comment: (NOTE) KOH/Calcofluor preparation:  no fungus observed. Performed At: [facility] [place], [place] [id number] Rush Farmer MD [id number]          Radiology Studies: DG Chest 2 View  Result Date: 10/20/2022 CLINICAL DATA:  Pleural effusion EXAM: CHEST - 2 VIEW COMPARISON:  None Available. FINDINGS: Enlarged cardiac silhouette. The LEFT heart border is obscured by large LEFT pleural effusion. No change in effusion volume. Central  venous congestion in the RIGHT lung. No pneumothorax. IMPRESSION: 1. No change in large LEFT pleural effusion. 2. Central venous congestion. 3. No interval change. Electronically Signed   By: Suzy Bouchard M.D.   On: 10/20/2022 08:32   DG Chest 2 View  Result Date: 10/19/2022 CLINICAL DATA:  32 year old female with left pleural effusion. EXAM: CHEST - 2 VIEW COMPARISON:  10/18/2022, 10/14/2022 FINDINGS: Similar appearing mild rightward mediastinal shift with obscuration of the left aspect of the cardiomediastinal silhouette, unchanged. Similar appearance of large left pleural effusion. Similar to slightly worsening diffuse hazy bibasilar opacities. No acute osseous abnormality. IMPRESSION: 1. Similar appearing large left pleural effusion with associated left basilar atelectasis. 2. Slight interval increased hazy opacities about the right lower lobe as could be seen with worsening pneumonia, atelectasis, edema, or technical factors due to positioning. Electronically Signed   By: Ruthann Cancer M.D.   On: 10/19/2022 07:56   DG CHEST PORT 1 VIEW  Result Date: 10/18/2022 CLINICAL DATA:  Pneumonia EXAM: PORTABLE CHEST 1 VIEW COMPARISON:  10/17/2022, CT 10/14/2022 FINDINGS: Residual moderate large left effusion appears slightly increased. Worsened airspace disease at left lung base. Slightly improved aeration of right thorax. Obscured cardiomediastinal silhouette. No pneumothorax IMPRESSION: Slightly increased moderate to large left effusion with worsened airspace disease at the left lung base. Slightly improved aeration of the right thorax. Electronically Signed   By: Donavan Foil M.D.   On: 10/18/2022 17:05        Scheduled Meds:  docusate sodium  100 mg Oral BID   enoxaparin (LOVENOX) injection  40 mg Subcutaneous Q24H   gabapentin  300 mg Oral TID   guaiFENesin  600 mg Oral BID   miconazole nitrate   Topical BID   polyethylene glycol  17 g Oral Daily   potassium chloride  40 mEq Oral BID    sodium chloride flush  3 mL Intravenous Q12H   Continuous Infusions:  ceFEPime (MAXIPIME) IV 2 g (10/20/22 0629)   vancomycin 1,500 mg (10/20/22 0759)      LOS: 6 days    Time spent: 35 minutes    Barb Merino, MD [facility] Pager [phone]

## 2022-10-20 NOTE — Progress Notes (Addendum)
      [address]       [place] [address]             [phone]           Subjective: Patient asked to see this am's CXR so I pulled it up on computer in room and we discussed findings.  Objective: Vital signs in last 24 hours: Temp:  [99 F (37.2 C)-100 F (37.8 C)] 99.3 F (37.4 C) (02/22 0300) Pulse Rate:  [96-108] 96 (02/22 0300) Cardiac Rhythm: Sinus tachycardia (02/21 1930) Resp:  [15-24] 20 (02/22 0300) BP: (104-140)/(52-95) 131/81 (02/22 0300) SpO2:  [100 %] 100 % (02/22 0300) Weight:  [145.9 kg] 145.9 kg (02/22 0258)   Intake/Output from previous day: 02/21 0701 - 02/22 0700 In: 1280 [P.O.:480; IV Piggyback:800] Out: 500 [Urine:500]   Physical Exam:  Cardiovascular: RRR Pulmonary: Diminished breath sounds on left Abdomen: Soft, obese, non tender, bowel sounds present. Extremities: Mild bilateral lower extremity edema. Wounds: Clean and dry.  No erythema or signs of infection.    Lab Results: CBC: Recent Labs    10/17/22 1732 10/19/22 0149  WBC 15.5* 10.7*  HGB 8.3* 7.3*  HCT 27.0* 23.8*  PLT 263 244    BMET:  Recent Labs    10/17/22 0939  NA 136  K 3.8  CL 99  CO2 23  GLUCOSE 121*  BUN 6  CREATININE 0.65  CALCIUM 8.8*     PT/INR: No results for input(s): "LABPROT", "INR" in the last 72 hours. ABG:  INR: Will add last result for INR, ABG once components are confirmed Will add last 4 CBG results once components are confirmed  Assessment/Plan:  1. CV - ST;some of which is related to pain 2.  Pulmonary - S/p left thoracentesis with 500 ml removed. On 4 liters of oxygen via [place]. Wean as able. CXR this am again shows patient with large left effusion/atelectasis. To OR in am for left VATS, drain effusion, possible decortication. Encourage incentive spirometer and flutter valve 3. ID-on Vancomycin and Cefepime for PNA. Last WBC decreased to 10,700.Gram stain left pleural fluid shows no organisms. Culture shows no  growth 3 days. 4. On Lovenox for DVT prophylaxis 5. Anemia-Last H and H  7.3 and 23.8. Per primary   Lylliana Kitamura M ZimmermanPA-C 10/20/2022,7:00 AM

## 2022-10-20 NOTE — Progress Notes (Signed)
Patient requesting Ativan so she can go back to sleep. Enc. Patient to order breakfast.

## 2022-10-20 NOTE — Progress Notes (Signed)
Mobility Specialist - Progress Note   10/20/22 1100  Mobility  Activity Refused mobility    Pt declined mobility, no reason specified. Stated "maybe a little later." Will follow up as able.   [place] Specialist Please contact via SecureChat or Rehab office at [phone]

## 2022-10-21 ENCOUNTER — Other Ambulatory Visit: Payer: Self-pay

## 2022-10-21 ENCOUNTER — Inpatient Hospital Stay (HOSPITAL_COMMUNITY): Payer: Medicaid Other | Admitting: Certified Registered"

## 2022-10-21 ENCOUNTER — Inpatient Hospital Stay (HOSPITAL_COMMUNITY): Payer: Medicaid Other

## 2022-10-21 ENCOUNTER — Encounter (HOSPITAL_COMMUNITY)
Admission: EM | Disposition: A | Discharge status: Home / Self Care | Payer: Self-pay | Source: Home / Self Care | Attending: Internal Medicine

## 2022-10-21 ENCOUNTER — Encounter (HOSPITAL_COMMUNITY): Payer: Self-pay | Admitting: Internal Medicine

## 2022-10-21 DIAGNOSIS — Z6841 Body Mass Index (BMI) 40.0 and over, adult: Secondary | ICD-10-CM

## 2022-10-21 DIAGNOSIS — Z87891 Personal history of nicotine dependence: Secondary | ICD-10-CM

## 2022-10-21 DIAGNOSIS — I10 Essential (primary) hypertension: Secondary | ICD-10-CM | POA: Diagnosis not present

## 2022-10-21 DIAGNOSIS — J918 Pleural effusion in other conditions classified elsewhere: Secondary | ICD-10-CM

## 2022-10-21 DIAGNOSIS — D649 Anemia, unspecified: Secondary | ICD-10-CM

## 2022-10-21 DIAGNOSIS — R6510 Systemic inflammatory response syndrome (SIRS) of non-infectious origin without acute organ dysfunction: Secondary | ICD-10-CM | POA: Diagnosis not present

## 2022-10-21 DIAGNOSIS — J90 Pleural effusion, not elsewhere classified: Secondary | ICD-10-CM

## 2022-10-21 DIAGNOSIS — J181 Lobar pneumonia, unspecified organism: Secondary | ICD-10-CM

## 2022-10-21 DIAGNOSIS — J188 Other pneumonia, unspecified organism: Secondary | ICD-10-CM

## 2022-10-21 HISTORY — PX: DECORTICATION: SHX5101

## 2022-10-21 HISTORY — PX: VIDEO ASSISTED THORACOSCOPY: SHX5073

## 2022-10-21 HISTORY — PX: PLEURAL EFFUSION DRAINAGE: SHX5099

## 2022-10-21 LAB — BODY FLUID CULTURE W GRAM STAIN: Culture: NO GROWTH

## 2022-10-21 LAB — CBC WITH DIFFERENTIAL/PLATELET
Abs Immature Granulocytes: 0.18 10*3/uL — ABNORMAL HIGH (ref 0.00–0.07)
Basophils Absolute: 0.1 10*3/uL (ref 0.0–0.1)
Basophils Relative: 1 %
Eosinophils Absolute: 0.5 10*3/uL (ref 0.0–0.5)
Eosinophils Relative: 4 %
HCT: 24.1 % — ABNORMAL LOW (ref 36.0–46.0)
Hemoglobin: 7.1 g/dL — ABNORMAL LOW (ref 12.0–15.0)
Immature Granulocytes: 1 %
Lymphocytes Relative: 17 %
Lymphs Abs: 2.2 10*3/uL (ref 0.7–4.0)
MCH: 17.4 pg — ABNORMAL LOW (ref 26.0–34.0)
MCHC: 29.5 g/dL — ABNORMAL LOW (ref 30.0–36.0)
MCV: 59.1 fL — ABNORMAL LOW (ref 80.0–100.0)
Monocytes Absolute: 0.8 10*3/uL (ref 0.1–1.0)
Monocytes Relative: 6 %
Neutro Abs: 9.2 10*3/uL — ABNORMAL HIGH (ref 1.7–7.7)
Neutrophils Relative %: 71 %
Platelets: 309 10*3/uL (ref 150–400)
RBC: 4.08 MIL/uL (ref 3.87–5.11)
RDW: 20.5 % — ABNORMAL HIGH (ref 11.5–15.5)
WBC: 13 10*3/uL — ABNORMAL HIGH (ref 4.0–10.5)
nRBC: 0.6 % — ABNORMAL HIGH (ref 0.0–0.2)

## 2022-10-21 LAB — PREPARE RBC (CROSSMATCH)

## 2022-10-21 SURGERY — VIDEO ASSISTED THORACOSCOPY
Anesthesia: General | Site: Chest | Laterality: Left

## 2022-10-21 MED ORDER — ONDANSETRON HCL 4 MG/2ML IJ SOLN
INTRAMUSCULAR | Status: AC
  Filled 2022-10-21: qty 2

## 2022-10-21 MED ORDER — FENTANYL CITRATE (PF) 100 MCG/2ML IJ SOLN
INTRAMUSCULAR | Status: AC
  Administered 2022-10-21: 100 ug via INTRAVENOUS
  Filled 2022-10-21: qty 2

## 2022-10-21 MED ORDER — SODIUM CHLORIDE 0.45 % IV SOLN: INTRAVENOUS | Status: DC

## 2022-10-21 MED ORDER — SODIUM CHLORIDE (PF) 0.9 % IJ SOLN
INTRAMUSCULAR | Status: AC
  Filled 2022-10-21: qty 20

## 2022-10-21 MED ORDER — ACETAMINOPHEN 10 MG/ML IV SOLN
1000.0000 mg | Freq: Once | INTRAVENOUS | Status: AC
  Administered 2022-10-21: 1000 mg via INTRAVENOUS

## 2022-10-21 MED ORDER — ROCURONIUM BROMIDE 10 MG/ML (PF) SYRINGE
PREFILLED_SYRINGE | INTRAVENOUS | Status: AC
  Filled 2022-10-21: qty 10

## 2022-10-21 MED ORDER — SUFENTANIL CITRATE 50 MCG/ML IV SOLN
INTRAVENOUS | Status: DC | PRN
  Administered 2022-10-21 (×3): 10 ug via INTRAVENOUS
  Administered 2022-10-21: 20 ug via INTRAVENOUS

## 2022-10-21 MED ORDER — CHLORHEXIDINE GLUCONATE CLOTH 2 % EX PADS
6.0000 | MEDICATED_PAD | Freq: Every day | CUTANEOUS | Status: DC
  Administered 2022-10-21 – 2022-10-28 (×8): 6 via TOPICAL

## 2022-10-21 MED ORDER — MIDAZOLAM HCL 2 MG/2ML IJ SOLN
INTRAMUSCULAR | Status: AC
  Administered 2022-10-21: 2 mg via INTRAVENOUS
  Filled 2022-10-21: qty 2

## 2022-10-21 MED ORDER — LACTATED RINGERS IV SOLN: INTRAVENOUS | Status: DC

## 2022-10-21 MED ORDER — 0.9 % SODIUM CHLORIDE (POUR BTL) OPTIME
TOPICAL | Status: DC | PRN
  Administered 2022-10-21: 3000 mL

## 2022-10-21 MED ORDER — PROPOFOL 10 MG/ML IV BOLUS
INTRAVENOUS | Status: DC | PRN
  Administered 2022-10-21: 200 mg via INTRAVENOUS

## 2022-10-21 MED ORDER — GABAPENTIN 300 MG PO CAPS: 300.0000 mg | ORAL_CAPSULE | Freq: Two times a day (BID) | ORAL | Status: DC

## 2022-10-21 MED ORDER — MIDAZOLAM HCL 2 MG/2ML IJ SOLN: 2.0000 mg | Freq: Once | INTRAMUSCULAR | Status: AC

## 2022-10-21 MED ORDER — ONDANSETRON HCL 4 MG/2ML IJ SOLN
INTRAMUSCULAR | Status: DC | PRN
  Administered 2022-10-21: 4 mg via INTRAVENOUS

## 2022-10-21 MED ORDER — ORAL CARE MOUTH RINSE: 15.0000 mL | Freq: Once | OROMUCOSAL | Status: AC

## 2022-10-21 MED ORDER — GABAPENTIN 300 MG PO CAPS
300.0000 mg | ORAL_CAPSULE | Freq: Every day | ORAL | Status: DC
  Administered 2022-10-21: 300 mg via ORAL
  Filled 2022-10-21: qty 1

## 2022-10-21 MED ORDER — SENNOSIDES-DOCUSATE SODIUM 8.6-50 MG PO TABS
1.0000 | ORAL_TABLET | Freq: Every day | ORAL | Status: DC
  Administered 2022-10-21 – 2022-10-26 (×5): 1 via ORAL
  Filled 2022-10-21 (×7): qty 1

## 2022-10-21 MED ORDER — MIDAZOLAM HCL 2 MG/2ML IJ SOLN
INTRAMUSCULAR | Status: AC
  Filled 2022-10-21: qty 2

## 2022-10-21 MED ORDER — KETAMINE HCL 50 MG/5ML IJ SOSY
PREFILLED_SYRINGE | INTRAMUSCULAR | Status: AC
  Filled 2022-10-21: qty 5

## 2022-10-21 MED ORDER — FENTANYL CITRATE (PF) 100 MCG/2ML IJ SOLN: 100.0000 ug | Freq: Once | INTRAMUSCULAR | Status: AC

## 2022-10-21 MED ORDER — PANTOPRAZOLE SODIUM 40 MG PO TBEC
40.0000 mg | DELAYED_RELEASE_TABLET | Freq: Every day | ORAL | Status: DC
  Administered 2022-10-22 – 2022-10-28 (×7): 40 mg via ORAL
  Filled 2022-10-21 (×7): qty 1

## 2022-10-21 MED ORDER — SODIUM CHLORIDE 0.9 % IV SOLN: INTRAVENOUS | Status: DC | PRN

## 2022-10-21 MED ORDER — SUGAMMADEX SODIUM 500 MG/5ML IV SOLN
INTRAVENOUS | Status: DC | PRN
  Administered 2022-10-21: 500 mg via INTRAVENOUS

## 2022-10-21 MED ORDER — MIDAZOLAM HCL 2 MG/2ML IJ SOLN
INTRAMUSCULAR | Status: DC | PRN
  Administered 2022-10-21: 2 mg via INTRAVENOUS

## 2022-10-21 MED ORDER — ONDANSETRON HCL 4 MG/2ML IJ SOLN: 4.0000 mg | Freq: Four times a day (QID) | INTRAMUSCULAR | Status: DC | PRN

## 2022-10-21 MED ORDER — KETAMINE HCL 10 MG/ML IJ SOLN
INTRAMUSCULAR | Status: DC | PRN
  Administered 2022-10-21 (×2): 10 mg via INTRAVENOUS

## 2022-10-21 MED ORDER — SUFENTANIL CITRATE 50 MCG/ML IV SOLN
INTRAVENOUS | Status: AC
  Filled 2022-10-21: qty 1

## 2022-10-21 MED ORDER — ACETAMINOPHEN 10 MG/ML IV SOLN
INTRAVENOUS | Status: AC
  Filled 2022-10-21: qty 100

## 2022-10-21 MED ORDER — ALBUTEROL SULFATE (2.5 MG/3ML) 0.083% IN NEBU
2.5000 mg | INHALATION_SOLUTION | RESPIRATORY_TRACT | Status: DC
  Administered 2022-10-21: 2.5 mg via RESPIRATORY_TRACT
  Filled 2022-10-21: qty 3

## 2022-10-21 MED ORDER — LIDOCAINE 2% (20 MG/ML) 5 ML SYRINGE
INTRAMUSCULAR | Status: AC
  Filled 2022-10-21: qty 5

## 2022-10-21 MED ORDER — OXYCODONE HCL 5 MG PO TABS
5.0000 mg | ORAL_TABLET | ORAL | Status: DC | PRN
  Administered 2022-10-21 – 2022-10-26 (×6): 10 mg via ORAL
  Filled 2022-10-21 (×6): qty 2

## 2022-10-21 MED ORDER — MIDAZOLAM HCL 2 MG/2ML IJ SOLN: 2.0000 mg | Freq: Once | INTRAMUSCULAR | Status: DC

## 2022-10-21 MED ORDER — DEXAMETHASONE SODIUM PHOSPHATE 10 MG/ML IJ SOLN
INTRAMUSCULAR | Status: AC
  Filled 2022-10-21: qty 1

## 2022-10-21 MED ORDER — HYDROMORPHONE HCL 1 MG/ML IJ SOLN
INTRAMUSCULAR | Status: AC
  Filled 2022-10-21: qty 1

## 2022-10-21 MED ORDER — DEXAMETHASONE SODIUM PHOSPHATE 10 MG/ML IJ SOLN
INTRAMUSCULAR | Status: DC | PRN
  Administered 2022-10-21: 10 mg via INTRAVENOUS

## 2022-10-21 MED ORDER — HYDROMORPHONE HCL 1 MG/ML IJ SOLN
0.2500 mg | INTRAMUSCULAR | Status: DC | PRN
  Administered 2022-10-21 (×3): 0.5 mg via INTRAVENOUS

## 2022-10-21 MED ORDER — CHLORHEXIDINE GLUCONATE 0.12 % MT SOLN
15.0000 mL | Freq: Once | OROMUCOSAL | Status: AC
  Administered 2022-10-21: 15 mL via OROMUCOSAL

## 2022-10-21 MED ORDER — SUGAMMADEX SODIUM 500 MG/5ML IV SOLN
INTRAVENOUS | Status: AC
  Filled 2022-10-21: qty 5

## 2022-10-21 MED ORDER — SODIUM CHLORIDE FLUSH 0.9 % IV SOLN
INTRAVENOUS | Status: DC | PRN
  Administered 2022-10-21: 100 mL

## 2022-10-21 MED ORDER — ROCURONIUM BROMIDE 10 MG/ML (PF) SYRINGE
PREFILLED_SYRINGE | INTRAVENOUS | Status: DC | PRN
  Administered 2022-10-21: 70 mg via INTRAVENOUS
  Administered 2022-10-21: 10 mg via INTRAVENOUS
  Administered 2022-10-21 (×2): 20 mg via INTRAVENOUS

## 2022-10-21 MED ORDER — PROPOFOL 10 MG/ML IV BOLUS
INTRAVENOUS | Status: AC
  Filled 2022-10-21: qty 20

## 2022-10-21 MED ORDER — BISACODYL 5 MG PO TBEC
10.0000 mg | DELAYED_RELEASE_TABLET | Freq: Every day | ORAL | Status: DC
  Administered 2022-10-24 – 2022-10-28 (×4): 10 mg via ORAL
  Filled 2022-10-21 (×6): qty 2

## 2022-10-21 MED ORDER — KETOROLAC TROMETHAMINE 15 MG/ML IJ SOLN
15.0000 mg | Freq: Four times a day (QID) | INTRAMUSCULAR | Status: DC
  Administered 2022-10-21: 15 mg via INTRAVENOUS
  Filled 2022-10-21 (×4): qty 1

## 2022-10-21 MED ORDER — ACETAMINOPHEN 500 MG PO TABS
1000.0000 mg | ORAL_TABLET | Freq: Four times a day (QID) | ORAL | Status: AC
  Administered 2022-10-22 – 2022-10-26 (×18): 1000 mg via ORAL
  Filled 2022-10-21 (×20): qty 2

## 2022-10-21 MED ORDER — ACETAMINOPHEN 160 MG/5ML PO SOLN: 1000.0000 mg | Freq: Four times a day (QID) | ORAL | Status: AC

## 2022-10-21 MED ORDER — FENTANYL CITRATE (PF) 100 MCG/2ML IJ SOLN: 100.0000 ug | Freq: Once | INTRAMUSCULAR | Status: DC

## 2022-10-21 MED ORDER — BUPIVACAINE HCL (PF) 0.5 % IJ SOLN
INTRAMUSCULAR | Status: AC
  Filled 2022-10-21: qty 30

## 2022-10-21 MED ORDER — SODIUM CHLORIDE 0.9% IV SOLUTION: Freq: Once | INTRAVENOUS | Status: DC

## 2022-10-21 MED ORDER — BUPIVACAINE LIPOSOME 1.3 % IJ SUSP
INTRAMUSCULAR | Status: AC
  Filled 2022-10-21: qty 20

## 2022-10-21 SURGICAL SUPPLY — 89 items
ADH SKN CLS APL DERMABOND .7 (GAUZE/BANDAGES/DRESSINGS) ×2
APL SWBSTK 6 STRL LF DISP (MISCELLANEOUS)
APPLICATOR COTTON TIP 6 STRL (MISCELLANEOUS) IMPLANT
APPLICATOR COTTON TIP 6IN STRL (MISCELLANEOUS) IMPLANT
APPLIER CLIP ROT 10 11.4 M/L (STAPLE)
APR CLP MED LRG 11.4X10 (STAPLE)
BLADE CLIPPER SURG (BLADE) ×2 IMPLANT
CANISTER SUCT 3000ML PPV (MISCELLANEOUS) ×2 IMPLANT
CATH THORACIC 28FR RT ANG (CATHETERS) IMPLANT
CATH THORACIC 36FR (CATHETERS) IMPLANT
CATH THORACIC 36FR RT ANG (CATHETERS) IMPLANT
CLIP APPLIE ROT 10 11.4 M/L (STAPLE) IMPLANT
CLIP VESOCCLUDE MED 6/CT (CLIP) IMPLANT
CNTNR URN SCR LID CUP LEK RST (MISCELLANEOUS) ×4 IMPLANT
CONN Y 3/8X3/8X3/8  BEN (MISCELLANEOUS) ×2
CONN Y 3/8X3/8X3/8 BEN (MISCELLANEOUS) ×2 IMPLANT
CONNECTOR BLAKE 2:1 CARIO BLK (MISCELLANEOUS) IMPLANT
CONT SPEC 4OZ STRL OR WHT (MISCELLANEOUS) ×8
COVER SURGICAL LIGHT HANDLE (MISCELLANEOUS) ×2 IMPLANT
DERMABOND ADVANCED .7 DNX12 (GAUZE/BANDAGES/DRESSINGS) IMPLANT
DRAIN CHANNEL 28F RND 3/8 FF (WOUND CARE) IMPLANT
DRAIN CHANNEL 32F RND 10.7 FF (WOUND CARE) IMPLANT
DRAPE CV SPLIT W-CLR ANES SCRN (DRAPES) ×2 IMPLANT
DRAPE ORTHO SPLIT 77X108 STRL (DRAPES) ×2
DRAPE SURG ORHT 6 SPLT 77X108 (DRAPES) ×2 IMPLANT
DRAPE WARM FLUID 44X44 (DRAPES) ×2 IMPLANT
ELECT BLADE 6.5 EXT (BLADE) ×2 IMPLANT
ELECT REM PT RETURN 9FT ADLT (ELECTROSURGICAL) ×2
ELECTRODE REM PT RTRN 9FT ADLT (ELECTROSURGICAL) ×2 IMPLANT
GAUZE 4X4 16PLY ~~LOC~~+RFID DBL (SPONGE) ×2 IMPLANT
GAUZE SPONGE 4X4 12PLY STRL (GAUZE/BANDAGES/DRESSINGS) ×2 IMPLANT
GLOVE SS BIOGEL STRL SZ 7.5 (GLOVE) ×2 IMPLANT
GLOVE SURG MICRO LTX SZ7.5 (GLOVE) ×2 IMPLANT
GLOVE SURG SIGNA 7.5 PF LTX (GLOVE) ×4 IMPLANT
GOWN STRL REUS W/ TWL LRG LVL3 (GOWN DISPOSABLE) ×4 IMPLANT
GOWN STRL REUS W/ TWL XL LVL3 (GOWN DISPOSABLE) ×2 IMPLANT
GOWN STRL REUS W/TWL LRG LVL3 (GOWN DISPOSABLE) ×4
GOWN STRL REUS W/TWL XL LVL3 (GOWN DISPOSABLE) ×2
HEMOSTAT SURGICEL 2X14 (HEMOSTASIS) IMPLANT
IV CATH 22GX1 FEP (IV SOLUTION) IMPLANT
KIT BASIN OR (CUSTOM PROCEDURE TRAY) ×2 IMPLANT
KIT SUCTION CATH 14FR (SUCTIONS) ×2 IMPLANT
KIT TURNOVER KIT B (KITS) ×2 IMPLANT
NDL HYPO 25GX1X1/2 BEV (NEEDLE) ×2 IMPLANT
NEEDLE HYPO 25GX1X1/2 BEV (NEEDLE) ×2 IMPLANT
NS IRRIG 1000ML POUR BTL (IV SOLUTION) ×4 IMPLANT
PACK CHEST (CUSTOM PROCEDURE TRAY) ×2 IMPLANT
PAD ARMBOARD 7.5X6 YLW CONV (MISCELLANEOUS) ×4 IMPLANT
POUCH ENDO CATCH II 15MM (MISCELLANEOUS) IMPLANT
SEALANT PROGEL (MISCELLANEOUS) IMPLANT
SEALANT SURG COSEAL 4ML (VASCULAR PRODUCTS) IMPLANT
SEALANT SURG COSEAL 8ML (VASCULAR PRODUCTS) IMPLANT
SOL ANTI FOG 6CC (MISCELLANEOUS) ×2 IMPLANT
SPECIMEN JAR MEDIUM (MISCELLANEOUS) ×2 IMPLANT
SPONGE INTESTINAL PEANUT (DISPOSABLE) IMPLANT
SPONGE T-LAP 18X18 ~~LOC~~+RFID (SPONGE) ×8 IMPLANT
SPONGE T-LAP 4X18 ~~LOC~~+RFID (SPONGE) ×2 IMPLANT
SPONGE TONSIL 1 RF SGL (DISPOSABLE) ×2 IMPLANT
STOPCOCK 4 WAY LG BORE MALE ST (IV SETS) ×2 IMPLANT
SUT PROLENE 4 0 RB 1 (SUTURE)
SUT PROLENE 4-0 RB1 .5 CRCL 36 (SUTURE) IMPLANT
SUT SILK  1 MH (SUTURE) ×4
SUT SILK 1 MH (SUTURE) ×4 IMPLANT
SUT SILK 2 0SH CR/8 30 (SUTURE) IMPLANT
SUT SILK 3 0SH CR/8 30 (SUTURE) IMPLANT
SUT VIC AB 1 CTX 36 (SUTURE) ×2
SUT VIC AB 1 CTX36XBRD ANBCTR (SUTURE) IMPLANT
SUT VIC AB 2-0 CTX 36 (SUTURE) IMPLANT
SUT VIC AB 2-0 UR6 27 (SUTURE) IMPLANT
SUT VIC AB 3-0 MH 27 (SUTURE) IMPLANT
SUT VIC AB 3-0 X1 27 (SUTURE) ×2 IMPLANT
SUT VICRYL 2 TP 1 (SUTURE) IMPLANT
SYR 10ML LL (SYRINGE) ×2 IMPLANT
SYR 20ML LL LF (SYRINGE) IMPLANT
SYR 50ML LL SCALE MARK (SYRINGE) ×2 IMPLANT
SYS BAG RETRIEVAL 10MM (BASKET)
SYSTEM BAG RETRIEVAL 10MM (BASKET) IMPLANT
SYSTEM SAHARA CHEST DRAIN ATS (WOUND CARE) ×2 IMPLANT
TAPE CLOTH 4X10 WHT NS (GAUZE/BANDAGES/DRESSINGS) ×2 IMPLANT
TAPE CLOTH SURG 4X10 WHT LF (GAUZE/BANDAGES/DRESSINGS) IMPLANT
TIP APPLICATOR SPRAY EXTEND 16 (VASCULAR PRODUCTS) IMPLANT
TOWEL GREEN STERILE (TOWEL DISPOSABLE) ×2 IMPLANT
TOWEL GREEN STERILE FF (TOWEL DISPOSABLE) ×2 IMPLANT
TRAP SPECIMEN MUCUS 40CC (MISCELLANEOUS) IMPLANT
TRAY FOLEY MTR SLVR 16FR STAT (SET/KITS/TRAYS/PACK) ×2 IMPLANT
TROCAR XCEL BLADELESS 5X75MML (TROCAR) ×2 IMPLANT
TROCAR XCEL NON-BLD 5MMX100MML (ENDOMECHANICALS) IMPLANT
TUBING EXTENTION W/L.L. (IV SETS) ×2 IMPLANT
WATER STERILE IRR 1000ML POUR (IV SOLUTION) ×4 IMPLANT

## 2022-10-21 NOTE — Anesthesia Procedure Notes (Signed)
Procedure Name: Intubation Date/Time: 10/21/2022 12:08 PM  Performed by: Moshe Salisbury, CRNAPre-anesthesia Checklist: Patient identified, Emergency Drugs available, Suction available and Patient being monitored Patient Re-evaluated:Patient Re-evaluated prior to induction Oxygen Delivery Method: Circle System Utilized Preoxygenation: Pre-oxygenation with 100% oxygen Induction Type: IV induction Ventilation: Mask ventilation without difficulty Laryngoscope Size: Glidescope and 3 Grade View: Grade II Tube type: Oral Endobronchial tube: Left, Double lumen EBT, EBT position confirmed by auscultation and EBT position confirmed by fiberoptic bronchoscope and 37 Fr Number of attempts: 5 or more Airway Equipment and Method: Stylet and Video-laryngoscopy (Grade II view with abundance or redundant tissue around cords with Mac 3, Unable to pass EBT. Mask vent, Laryngoscopy repeated with no better view or access to vc, 2 handed mask vent, DL by EF, mask vent, glide scope with EBT placed on 2nd att.) Placement Confirmation: ETT inserted through vocal cords under direct vision, positive ETCO2 and breath sounds checked- equal and bilateral Secured at: 27 cm Tube secured with: Tape Dental Injury: Teeth and Oropharynx as per pre-operative assessment  Difficulty Due To: Difficulty was unanticipated and Difficult Airway- due to large tongue Future Recommendations: Recommend- induction with short-acting agent, and alternative techniques readily available

## 2022-10-21 NOTE — H&P (Incomplete)
Patient educated on pain medication regimen and options. Pt requesting MD bedside. MD notified. Awaiting response.

## 2022-10-21 NOTE — Anesthesia Preprocedure Evaluation (Addendum)
Anesthesia Evaluation  Patient identified by MRN, date of birth, ID band Patient awake    Reviewed: Allergy & Precautions, H&P , NPO status , Patient's Chart, lab work & pertinent test results  History of Anesthesia Complications (+) PONV and history of anesthetic complications  Airway Mallampati: I  TM Distance: >3 FB Neck ROM: Full    Dental no notable dental hx. (+) Teeth Intact, Dental Advisory Given   Pulmonary Patient abstained from smoking., former smoker   Pulmonary exam normal breath sounds clear to auscultation       Cardiovascular hypertension, + dysrhythmias  Rhythm:Regular Rate:Normal     Neuro/Psych  Headaches  negative psych ROS   GI/Hepatic negative GI ROS, Neg liver ROS,,,  Endo/Other    Morbid obesity  Renal/GU negative Renal ROS  negative genitourinary   Musculoskeletal   Abdominal   Peds  Hematology  (+) Blood dyscrasia, anemia   Anesthesia Other Findings   Reproductive/Obstetrics negative OB ROS                             Anesthesia Physical Anesthesia Plan  ASA: 3  Anesthesia Plan: General   Post-op Pain Management: Ofirmev IV (intra-op)*   Induction: Intravenous  PONV Risk Score and Plan: 4 or greater and Ondansetron, Dexamethasone and Midazolam  Airway Management Planned: Double Lumen EBT  Additional Equipment: Arterial line, CVP and Ultrasound Guidance Line Placement  Intra-op Plan:   Post-operative Plan: Extubation in OR  Informed Consent: I have reviewed the patients History and Physical, chart, labs and discussed the procedure including the risks, benefits and alternatives for the proposed anesthesia with the patient or authorized representative who has indicated his/her understanding and acceptance.     Dental advisory given  Plan Discussed with: CRNA  Anesthesia Plan Comments:        Anesthesia Quick Evaluation

## 2022-10-21 NOTE — Brief Op Note (Signed)
10/21/2022  3:55 PM  PATIENT:  Julie King  32 y.o. female  PRE-OPERATIVE DIAGNOSIS:  LEFT PARAPNEUMONIC EFFUSION  POST-OPERATIVE DIAGNOSIS:  LEFT PARAPNEUMONIC EFFUSION  PROCEDURE:  Procedure(s): VIDEO ASSISTED THORACOSCOPY (Left) DRAINAGE OF PLEURAL EFFUSION (Left) DECORTICATION (Left)  SURGEON:  Surgeon(s) and Role:    * Melrose Nakayama, MD - Primary  PHYSICIAN ASSISTANT: Wynelle Beckmann PA-C  ASSISTANTS: none   ANESTHESIA:   local and general  EBL:  150 mL   BLOOD ADMINISTERED: 1 unit CC PRBC  DRAINS:  2 left pleural chest tubes    LOCAL MEDICATIONS USED:  OTHER exparel  SPECIMEN:  Source of Specimen:  Pleural peel, pleural fluid  DISPOSITION OF SPECIMEN:   Culture, gram stain, pathology  COUNTS CORRECT:  YES  DICTATION: .Dragon Dictation  PLAN OF CARE: Admit to inpatient   PATIENT DISPOSITION:  PACU - hemodynamically stable.   Delay start of Pharmacological VTE agent (>24hrs) due to surgical blood loss or risk of bleeding: yes

## 2022-10-21 NOTE — Op Note (Signed)
NAMEALGENE, HOLLANDER MEDICAL RECORD NO: [id number] ACCOUNT NO: [id number] DATE OF BIRTH: 21-Dec-1990 FACILITY: [facility] LOCATION: [facility] PHYSICIAN: Revonda Standard. Roxan Hockey, MD  Operative Report   DATE OF PROCEDURE: 10/21/2022  PREOPERATIVE DIAGNOSIS:  Left lower lobe pneumonia with parapneumonic effusion.  POSTOPERATIVE DIAGNOSIS:  Left lower lobe pneumonia with parapneumonic effusion.  PROCEDURE:  Left VATS drainage of parapneumonic effusion and decortication.  SURGEON:  Revonda Standard. Roxan Hockey, MD  ASSISTANT:  Wynelle Beckmann, PA  ANESTHESIA:  General.  FINDINGS: Complex loculated effusion.  Peel over and inferior upper lobe and majority of the lower lobe.  Good reexpansion of both lobes post-decortication.  CLINICAL NOTE:  Ms. 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  She presented back with a pneumonia with a parapneumonic effusion.  She had a thoracentesis.  Cultures showed no growth, but the fluid rapidly recurred.  She was advised to undergo VATS for drainage of the effusion and decortication, placement of drains.  The indications, risks, benefits, and alternatives were discussed in detail with the patient.  She understood and accepted the risks and agreed to proceed.  DESCRIPTION OF PROCEDURE:  Ms. Brull was brought to the preoperative holding area on 10/21/2022.  Anesthesia placed an arterial blood pressure monitoring line and a central venous catheter.  She was taken to the operating room, anesthetized, and intubated with a double lumen endotracheal tube.  This was difficult due to body habitus.  Ultimately there was good placement of the tube.  Intravenous antibiotics were already being given on a scheduled dosage.  A Foley catheter was placed.  Sequential compression devices were in place for DVT prophylaxis.  She was placed in a right lateral decubitus position.  A Bair Hugger was placed for  active warming.  The left chest was prepped and draped in the usual sterile fashion.  A timeout was performed.  A solution containing 20 mL of liposomal bupivacaine, 30 mL of 0.5% bupivacaine and 50 mL of saline was prepared.  This was used for local at the incision sites.  =One of the previous eighth interspace incisions was reopened and a 5 mm port was inserted.  The 5 mm thoracoscope was advanced into the chest.  There was a murky fluid, the fluid was evacuated and sent for cultures.  A view was very limited due to adhesions and inflammatory exudate.  A 5 cm working incision was made in  approximately the sixth interspace anterolaterally.  A sucker then was used to break up adhesions and evacuate fluid.  This allowed better visualization with the scope and then the lung was mobilized circumferentially and the fissure was also opened.  There was a loculated fluid in the fissure as well.  There was an inflammatory peel on the lingula as well as the lower lobe. Posteriorly there were some whitish punctate spots on the peel.  The peel was debrided off the lingula until there was a good reexpansion with positive pressure ventilation.  The peel was more difficult to manage on the lower lobe, but a plane was developed in several different sites and the majority of the peel was removed.  Posteriorly, there were these punctate areas.  Part of that was sent for culture and the remainder for pathology.  Working inferiorly the peel was stripped near the diaphragm.  There were some areas of purulent drainage from the lung itself that also was sent for cultures.  The lower  lobe was periodically reinflated with positive pressure ventilation to inspect for reexpansion.  When there appeared to be good reexpansion, the parietal pleural peel was decorticated.  There was very minimal peel on the parietal surface.  Two 28-French Blake drains were placed through separate incisions, one was through the eighth interspace incision  that had previously been used and the other was anterior to that.  Dual lung ventilation was resumed.  The working incision was closed in standard fashion.  The chest tube was placed to a Pleur-Evac on suction.  The patient was extubated in the operating room and taken to the postanesthetic care unit in good condition.  All sponge, needle and instrument counts were correct at the end of the procedure.  Experienced assistance was necessary for this case.  Wynelle Beckmann, served as the Environmental consultant providing assistance with camera management, tube placement, retraction and wound closure.   PUS D: 10/21/2022 4:14:45 pm T: 10/21/2022 5:06:00 pm  JOB: [id number]

## 2022-10-21 NOTE — Progress Notes (Signed)
PROGRESS NOTE    Julie King  [id number] DOB: 1991/07/29 DOA: 10/14/2022 PCP: Hattie Perch, MD    Brief Narrative:  32 year old with recently diagnosed lingular mass, robotic biopsy 06/2022 negative for malignancy, repeat CT scans with increasing size and hemoptysis.   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  Pathology negative for malignancy and showed inflammation with focal necrosis, fibrosis and pneumocyte hyperplasia.  Discharge home after surgery.    Presented back to the emergency room with 3 days of worsening left-sided chest pain, subjective fever, cough, nausea and orthopnea.  CT angiogram with postsurgical changes small left pleural effusion. Patient remained in the hospital, she was persistently febrile and leukocytosis. 2/19, repeat chest x-ray with increased effusion on the left side.  IR guided thoracentesis 500 mL removed, no growth so far.  Clinically improving with improved fever and improved leukocytosis counts but persistent large left-sided pleural effusion and hypoxemia.  Scheduled for VATS 2/23.   Assessment & plan of care:   Left lower lobe pneumonia, complicated parapneumonic effusion with pleurisy, Postoperative pleural space infection. Sepsis present on admission.  Currently on broad-spectrum antibiotics with vancomycin and cefepime.  Completed azithromycin for 3 days.   Continue chest physiotherapy, incentive spirometry, deep breathing exercises, sputum induction, mucolytic's and bronchodilators. Sputum cultures 2/17, respiratory flora. Blood cultures, negative so far. Surgical culture from 2/8 with Streptococcus. Fluid culture from 2/19, no growth so far.  Minimal leukocytes.  LDH 600. Supplemental oxygen to keep saturations more than 90%. Encourage oral pain medications and NSAIDs, will taper off IV narcotics. CT angiogram negative for PE, DVT studies negative. Pulmonary and CT surgery following. Fever curve improving,  leukocytosis improving. Repeat x-rays with persistent loculated left-sided pleural effusion and oxygen requirement.  Postoperative pleural effusion. For VATS today.    Acute on chronic microcytic anemia: Baseline hemoglobin about 9-10.  Drop in hemoglobin secondary to multiple procedures, hospitalizations.  B12 is adequate.  Ferritin level is adequate.   Hemoglobin 7.1  Will benefit with 1 unit PRBC transfusion perioperative.  Will defer to surgery. Patient has consented for blood transfusion if needed.  Abnormal urine: Urine cultures collected 2/18.  Never reported.  Already on broad-spectrum antibiotics.  Hypokalemia: Replace.  Magnesium and phosphorus are adequate.  Morbid obesity with BMI more than 50: Definitely she will benefit with weight loss and lifestyle modification.  Will continue to have discussion once more medically stable.   DVT prophylaxis: enoxaparin (LOVENOX) injection 40 mg Start: 10/14/22 1115   Code Status: Full code Family Communication: None at the bedside.  Multiple discussions with mother on the phone Disposition Plan: Status is: Inpatient Remains inpatient appropriate because: Severe pleuritic chest pain, IV antibiotics.  Inpatient procedures planned.     Consultants:  Pulmonary CT surgery,  IR  Procedures:  Thoracentesis left pleural space 2/19, 500 mL exudative fluid removed.  Antimicrobials:  Rocephin azithromycin 2/16--- Azithromycin, vancomycin and cefepime 2/17--   Subjective:  Patient seen and examined in the morning rounds around 9 AM.  No overnight events.  Afebrile.  Heart rate controlled.  Breathing much better but anxious about procedure.   Objective: Vitals:   10/21/22 1055 10/21/22 1103 10/21/22 1106 10/21/22 1112  BP:  (!) 145/89 132/78 (!) 145/82  Pulse:  99 100 98  Resp:  (!) 25 19 (!) 24  Temp:   99.1 F (37.3 C)   TempSrc:   Oral   SpO2: 100% 100% 99% 100%  Weight:  Height:        Intake/Output Summary (Last  24 hours) at 10/21/2022 1126 Last data filed at 10/21/2022 [id number] Gross per 24 hour  Intake 1560 ml  Output 2125 ml  Net -565 ml    Filed Weights   10/14/22 0152 10/20/22 0258 10/21/22 1026  Weight: (!) 145 kg (!) 145.9 kg (!) 145.2 kg    Examination:  General: She looks very comfortable today on room air.  Able to talk in complete sentences. Cardiovascular: S1-S2 normal.  Regular rhythm. Respiratory: Good air entry on the right side.  Poor air entry on the left side.  Better inspiratory effort but is still less than 500 on incentive spirometry. Gastrointestinal: Soft.  Obese and pendulous. Ext: No edema. Neuro: Alert oriented x 4.  Neurologically intact. Musculoskeletal: No deformities.     Data Reviewed: I have personally reviewed following labs and imaging studies  CBC: Recent Labs  Lab 10/16/22 0116 10/17/22 0939 10/17/22 1732 10/19/22 0149 10/20/22 0716 10/21/22 0629  WBC 21.2* 19.6* 15.5* 10.7* 11.3* 13.0*  NEUTROABS 17.1* 16.1*  --  7.2 8.1* 9.2*  HGB 8.7* 7.8* 8.3* 7.3* 7.2* 7.1*  HCT 28.2* 25.0* 27.0* 23.8* 23.6* 24.1*  MCV 58.8* 58.1* 58.3* 58.5* 57.7* 59.1*  PLT 269 291 263 244 281 [id number]    Basic Metabolic Panel: Recent Labs  Lab 10/15/22 0116 10/16/22 0116 10/17/22 0939 10/20/22 0716  NA 134* 133* 136 138  K 4.2 4.3 3.8 3.2*  CL 99 99 99 105  CO2 '25 23 23 24  '$ GLUCOSE 144* 119* 121* 127*  BUN '9 8 6 '$ 5*  CREATININE 0.81 0.63 0.65 0.64  CALCIUM 8.8* 8.6* 8.8* 8.4*  MG  --  1.9  --  1.8  PHOS  --   --   --  3.2    GFR: Estimated Creatinine Clearance: 152.8 mL/min (by C-G formula based on SCr of 0.64 mg/dL). Liver Function Tests: Recent Labs  Lab 10/16/22 0116 10/17/22 0939 10/20/22 0716  AST '17 27 31  '$ ALT 18 23 39  ALKPHOS 81 88 91  BILITOT 0.6 0.7 0.3  PROT 7.1 7.2 6.4*  ALBUMIN 2.8* 2.5* 2.2*    No results for input(s): "LIPASE", "AMYLASE" in the last 168 hours. No results for input(s): "AMMONIA" in the last 168 hours. Coagulation  Profile: No results for input(s): "INR", "PROTIME" in the last 168 hours. Cardiac Enzymes: No results for input(s): "CKTOTAL", "CKMB", "CKMBINDEX", "TROPONINI" in the last 168 hours. BNP (last 3 results) No results for input(s): "PROBNP" in the last 8760 hours. HbA1C: No results for input(s): "HGBA1C" in the last 72 hours. CBG: No results for input(s): "GLUCAP" in the last 168 hours. Lipid Profile: No results for input(s): "CHOL", "HDL", "LDLCALC", "TRIG", "CHOLHDL", "LDLDIRECT" in the last 72 hours. Thyroid Function Tests: No results for input(s): "TSH", "T4TOTAL", "FREET4", "T3FREE", "THYROIDAB" in the last 72 hours. Anemia Panel: Recent Labs    10/19/22 1205  VITAMINB12 206  FERRITIN 104  TIBC 209*  IRON 16*    Sepsis Labs: Recent Labs  Lab 10/14/22 1709 10/15/22 0116  PROCALCITON <0.10 0.15  LATICACIDVEN 0.9  --      Recent Results (from the past 240 hour(s))  Resp panel by RT-PCR (RSV, Flu A&B, Covid) Anterior Nasal Swab     Status: None   Collection Time: 10/14/22  3:32 AM   Specimen: Anterior Nasal Swab  Result Value Ref Range Status   SARS Coronavirus 2 by RT PCR NEGATIVE NEGATIVE Final  Influenza A by PCR NEGATIVE NEGATIVE Final   Influenza B by PCR NEGATIVE NEGATIVE Final    Comment: (NOTE) The Xpert Xpress SARS-CoV-2/FLU/RSV plus assay is intended as an aid in the diagnosis of influenza from Nasopharyngeal swab specimens and should not be used as a sole basis for treatment. Nasal washings and aspirates are unacceptable for Xpert Xpress SARS-CoV-2/FLU/RSV testing.  Fact Sheet for Patients: EntrepreneurPulse.com.au  Fact Sheet for Healthcare Providers: IncredibleEmployment.be  This test is not yet approved or cleared by the [place] FDA and has been authorized for detection and/or diagnosis of SARS-CoV-2 by FDA under an Emergency Use Authorization (EUA). This EUA will remain in effect (meaning this test can be  used) for the duration of the COVID-19 declaration under Section 564(b)(1) of the Act, 21 U.S.C. section 360bbb-3(b)(1), unless the authorization is terminated or revoked.     Resp Syncytial Virus by PCR NEGATIVE NEGATIVE Final    Comment: (NOTE) Fact Sheet for Patients: EntrepreneurPulse.com.au  Fact Sheet for Healthcare Providers: IncredibleEmployment.be  This test is not yet approved or cleared by the [place] FDA and has been authorized for detection and/or diagnosis of SARS-CoV-2 by FDA under an Emergency Use Authorization (EUA). This EUA will remain in effect (meaning this test can be used) for the duration of the COVID-19 declaration under Section 564(b)(1) of the Act, 21 U.S.C. section 360bbb-3(b)(1), unless the authorization is terminated or revoked.  Performed at [facility], [place] [address] [place], [place] [address]   Blood culture (routine x 2)     Status: None   Collection Time: 10/14/22  7:11 AM   Specimen: BLOOD  Result Value Ref Range Status   Specimen Description BLOOD RIGHT ANTECUBITAL  Final   Special Requests   Final    BOTTLES DRAWN AEROBIC AND ANAEROBIC Blood Culture results may not be optimal due to an inadequate volume of blood received in culture bottles   Culture   Final    NO GROWTH 5 DAYS Performed at [facility], [place] [address], [place], [place] [address]    Report Status 10/19/2022 FINAL  Final  Blood culture (routine x 2)     Status: None   Collection Time: 10/14/22  5:09 PM   Specimen: BLOOD LEFT HAND  Result Value Ref Range Status   Specimen Description BLOOD LEFT HAND  Final   Special Requests   Final    BOTTLES DRAWN AEROBIC AND ANAEROBIC Blood Culture adequate volume   Culture   Final    NO GROWTH 5 DAYS Performed at [facility], [place] [address] [place], [place] [address]    Report Status 10/19/2022 FINAL  Final  MRSA Next Gen by PCR, Nasal     Status: None   Collection  Time: 10/15/22  8:32 AM   Specimen: Nasal Mucosa; Nasal Swab  Result Value Ref Range Status   MRSA by PCR Next Gen NOT DETECTED NOT DETECTED Final    Comment: (NOTE) The GeneXpert MRSA Assay (FDA approved for NASAL specimens only), is one component of a comprehensive MRSA colonization surveillance program. It is not intended to diagnose MRSA infection nor to guide or monitor treatment for MRSA infections. Test performance is not FDA approved in patients less than 73 years old. Performed at [facility], [place] [address], [place],  [address]   Expectorated Sputum Assessment w Gram Stain, Rflx to Resp Cult     Status: None   Collection Time: 10/15/22  8:40 PM  Specimen: Sputum  Result Value Ref Range Status   Specimen Description SPUTUM  Final   Special Requests NONE  Final   Sputum evaluation   Final    THIS SPECIMEN IS ACCEPTABLE FOR SPUTUM CULTURE Performed at [facility], [address], [place], [place] [address]    Report Status 10/17/2022 FINAL  Final  Culture, Respiratory w Gram Stain     Status: None   Collection Time: 10/15/22  8:40 PM   Specimen: SPU  Result Value Ref Range Status   Specimen Description SPUTUM  Final   Special Requests NONE Reflexed from [id number]  Final   Gram Stain   Final    MODERATE WBC PRESENT,BOTH PMN AND MONONUCLEAR RARE GRAM POSITIVE COCCI IN PAIRS    Culture   Final    RARE Normal respiratory flora-no Staph aureus or Pseudomonas seen Performed at [facility], [place] [address] [place], [place] [address]    Report Status 10/18/2022 FINAL  Final  Fungus Culture With Stain     Status: None (Preliminary result)   Collection Time: 10/17/22 12:32 PM   Specimen: Lung, Left; Pleural Fluid  Result Value Ref Range Status   Fungus Stain Final report  Final    Comment: (NOTE) Performed At: [facility] [place], [place] [id number] Rush Farmer MD [id number]    Fungus (Mycology) Culture PENDING   Incomplete   Fungal Source FLUID  Final    Comment: Performed at [facility], [place] [address], [place], [place] [address]  Body fluid culture w Gram Stain     Status: None   Collection Time: 10/17/22 12:32 PM   Specimen: Lung, Left; Pleural Fluid  Result Value Ref Range Status   Specimen Description FLUID  Final   Special Requests  PLEURAL , LEFT LUNG  Final   Gram Stain   Final    WBC PRESENT,BOTH PMN AND MONONUCLEAR NO ORGANISMS SEEN CYTOSPIN SMEAR    Culture   Final    NO GROWTH 3 DAYS Performed at [facility], [place] [address], [place], [place] [address]    Report Status 10/21/2022 FINAL  Final  Fungus Culture Result     Status: None   Collection Time: 10/17/22 12:32 PM  Result Value Ref Range Status   Result 1 Comment  Final    Comment: (NOTE) KOH/Calcofluor preparation:  no fungus observed. Performed At: [facility] [place], [place] [id number] Rush Farmer MD [id number]          Radiology Studies: DG Chest 2 View  Result Date: 10/20/2022 CLINICAL DATA:  Pleural effusion EXAM: CHEST - 2 VIEW COMPARISON:  None Available. FINDINGS: Enlarged cardiac silhouette. The LEFT heart border is obscured by large LEFT pleural effusion. No change in effusion volume. Central venous congestion in the RIGHT lung. No pneumothorax. IMPRESSION: 1. No change in large LEFT pleural effusion. 2. Central venous congestion. 3. No interval change. Electronically Signed   By: Suzy Bouchard M.D.   On: 10/20/2022 08:32        Scheduled Meds:  [id number] Hold] sodium chloride   Intravenous Once   [MAR Hold] docusate sodium  100 mg Oral BID   [MAR Hold] enoxaparin (LOVENOX) injection  40 mg Subcutaneous Q24H   [MAR Hold] gabapentin  300 mg Oral TID   [MAR Hold] guaiFENesin  600 mg Oral BID   [MAR Hold] miconazole nitrate   Topical BID   [MAR Hold] polyethylene glycol  17 g Oral Daily   [MAR Hold] potassium chloride  40 mEq Oral BID   [MAR Hold] sodium  chloride flush  3 mL Intravenous Q12H   Continuous Infusions:  [MAR Hold] ceFEPime (MAXIPIME) IV Stopped (10/21/22 [id number])   lactated ringers     [MAR Hold] vancomycin 1,500 mg (10/21/22 0957)      LOS: 7 days    Time spent: 35 minutes    Barb Merino, MD [facility] Pager [phone]

## 2022-10-21 NOTE — Anesthesia Postprocedure Evaluation (Signed)
Anesthesia Post Note  Patient: Julie King  Procedure(s) Performed: VIDEO ASSISTED THORACOSCOPY (Left: Chest) DRAINAGE OF PLEURAL EFFUSION (Left) DECORTICATION (Left)     Patient location during evaluation: PACU Anesthesia Type: General Level of consciousness: awake and alert Pain management: pain level controlled Vital Signs Assessment: post-procedure vital signs reviewed and stable Respiratory status: spontaneous breathing, nonlabored ventilation, respiratory function stable and patient connected to nasal cannula oxygen Cardiovascular status: blood pressure returned to baseline Postop Assessment: no apparent nausea or vomiting Anesthetic complications: yes   Encounter Notable Events  Notable Event Outcome Phase Comment  Difficult to intubate - unexpected  Intraprocedure Filed from anesthesia note documentation.    Last Vitals:  Vitals:   10/21/22 1600 10/21/22 1615  BP: 123/64 123/69  Pulse: 99 98  Resp: (!) 28 (!) 27  Temp:  37.4 C  SpO2: 91% 92%    Last Pain:  Vitals:   10/21/22 1600  TempSrc:   PainSc: Asleep                 Marthenia Rolling

## 2022-10-21 NOTE — Interval H&P Note (Signed)
History and Physical Interval Note: Antibodies on T&S.  I do not anticipate major blood loss.  Will proceed.  10/21/2022 10:51 AM  [name]  has presented today for surgery, with the diagnosis of LARGE PARAPNEUMONIC EFFUSION.  The various methods of treatment have been discussed with the patient and family. After consideration of risks, benefits and other options for treatment, the patient has consented to  Procedure(s): VIDEO ASSISTED THORACOSCOPY (Left) DRAINAGE OF PLEURAL EFFUSION (Left) possible DECORTICATION (Left) as a surgical intervention.  The patient's history has been reviewed, patient examined, no change in status, stable for surgery.  I have reviewed the patient's chart and labs.  Questions were answered to the patient's satisfaction.     Julie King

## 2022-10-21 NOTE — Progress Notes (Signed)
OT Cancellation Note  Patient Details Name: Julie King MRN: [id number] DOB: 19-Dec-1990   Cancelled Treatment:    Reason Eval/Treat Not Completed: Patient at procedure or test/ unavailable.  Will check back as able.   Clearnce Sorrel Lorraine-COTA/L 10/21/2022, 10:24 AM

## 2022-10-21 NOTE — Transfer of Care (Signed)
Immediate Anesthesia Transfer of Care Note  Patient: Julie King  Procedure(s) Performed: VIDEO ASSISTED THORACOSCOPY (Left: Chest) DRAINAGE OF PLEURAL EFFUSION (Left) DECORTICATION (Left)  Patient Location: PACU  Anesthesia Type:General  Level of Consciousness: drowsy and patient cooperative  Airway & Oxygen Therapy: Patient Spontanous Breathing and Patient connected to nasal cannula oxygen  Post-op Assessment: Report given to RN and Post -op Vital signs reviewed and stable  Post vital signs: Reviewed and stable  Last Vitals:  Vitals Value Taken Time  BP 170/84 10/21/22 1437  Temp    Pulse 111 10/21/22 1443  Resp 35 10/21/22 1443  SpO2 92 % 10/21/22 1443  Vitals shown include unvalidated device data.  Last Pain:  Vitals:   10/21/22 1106  TempSrc: Oral  PainSc:       Patients Stated Pain Goal: 2 (Q000111Q Q000111Q)  Complications:  Encounter Notable Events  Notable Event Outcome Phase Comment  Difficult to intubate - unexpected  Intraprocedure Filed from anesthesia note documentation.

## 2022-10-21 NOTE — Anesthesia Procedure Notes (Signed)
Central Venous Catheter Insertion Performed by: Roderic Palau, MD, anesthesiologist Start/End2/23/2024 10:45 AM, 10/21/2022 11:00 AM Patient location: Pre-op. Preanesthetic checklist: patient identified, IV checked, site marked, risks and benefits discussed, surgical consent, monitors and equipment checked, pre-op evaluation, timeout performed and anesthesia consent Position: Trendelenburg Lidocaine 1% used for infiltration and patient sedated Hand hygiene performed , maximum sterile barriers used  and Seldinger technique used Catheter size: 8 Fr Total catheter length 16. Central line was placed.Double lumen Procedure performed using ultrasound guided technique. Ultrasound Notes:anatomy identified, needle tip was noted to be adjacent to the nerve/plexus identified, no ultrasound evidence of intravascular and/or intraneural injection and image(s) printed for medical record Attempts: 1 Following insertion, dressing applied, line sutured and Biopatch. Post procedure assessment: blood return through all ports  Patient tolerated the procedure well with no immediate complications.

## 2022-10-21 NOTE — Anesthesia Procedure Notes (Signed)
Arterial Line Insertion Start/End2/23/2024 10:45 AM, 10/21/2022 10:55 AM Performed by: Rande Brunt, CRNA  Patient location: Pre-op. Preanesthetic checklist: patient identified, IV checked, site marked, risks and benefits discussed, surgical consent, monitors and equipment checked, pre-op evaluation, timeout performed and anesthesia consent Lidocaine 1% used for infiltration Left, radial was placed Catheter size: 20 G Hand hygiene performed  and maximum sterile barriers used   Attempts: 1 Procedure performed without using ultrasound guided technique. Following insertion, dressing applied and Biopatch. Post procedure assessment: normal and unchanged  Patient tolerated the procedure well with no immediate complications.

## 2022-10-22 ENCOUNTER — Inpatient Hospital Stay (HOSPITAL_COMMUNITY): Payer: Medicaid Other

## 2022-10-22 ENCOUNTER — Encounter (HOSPITAL_COMMUNITY): Payer: Self-pay | Admitting: Thoracic Surgery (Cardiothoracic Vascular Surgery)

## 2022-10-22 DIAGNOSIS — R072 Precordial pain: Secondary | ICD-10-CM

## 2022-10-22 DIAGNOSIS — J90 Pleural effusion, not elsewhere classified: Secondary | ICD-10-CM

## 2022-10-22 DIAGNOSIS — R6510 Systemic inflammatory response syndrome (SIRS) of non-infectious origin without acute organ dysfunction: Secondary | ICD-10-CM | POA: Diagnosis not present

## 2022-10-22 LAB — BASIC METABOLIC PANEL
Anion gap: 7 (ref 5–15)
BUN: 9 mg/dL (ref 6–20)
CO2: 26 mmol/L (ref 22–32)
Calcium: 8.5 mg/dL — ABNORMAL LOW (ref 8.9–10.3)
Chloride: 103 mmol/L (ref 98–111)
Creatinine, Ser: 0.71 mg/dL (ref 0.44–1.00)
GFR, Estimated: >60 mL/min (ref >60)
Glucose, Bld: 109 mg/dL — ABNORMAL HIGH (ref 70–99)
Potassium: 4.6 mmol/L (ref 3.5–5.1)
Sodium: 136 mmol/L (ref 135–145)

## 2022-10-22 LAB — CBC
HCT: 24.4 % — ABNORMAL LOW (ref 36.0–46.0)
Hemoglobin: 7.6 g/dL — ABNORMAL LOW (ref 12.0–15.0)
MCH: 18.5 pg — ABNORMAL LOW (ref 26.0–34.0)
MCHC: 31.1 g/dL (ref 30.0–36.0)
MCV: 59.4 fL — ABNORMAL LOW (ref 80.0–100.0)
Platelets: 319 10*3/uL (ref 150–400)
RBC: 4.11 MIL/uL (ref 3.87–5.11)
RDW: 22.2 % — ABNORMAL HIGH (ref 11.5–15.5)
WBC: 17.1 10*3/uL — ABNORMAL HIGH (ref 4.0–10.5)
nRBC: 0.8 % — ABNORMAL HIGH (ref 0.0–0.2)

## 2022-10-22 LAB — GLUCOSE, CAPILLARY
Glucose-Capillary: 133 mg/dL — ABNORMAL HIGH (ref 70–99)
Glucose-Capillary: 203 mg/dL — ABNORMAL HIGH (ref 70–99)

## 2022-10-22 MED ORDER — GABAPENTIN 300 MG PO CAPS
300.0000 mg | ORAL_CAPSULE | Freq: Two times a day (BID) | ORAL | Status: DC
  Administered 2022-10-22: 300 mg via ORAL
  Filled 2022-10-22: qty 1

## 2022-10-22 MED ORDER — HYDROMORPHONE HCL 1 MG/ML IJ SOLN
1.0000 mg | INTRAMUSCULAR | Status: DC | PRN
  Administered 2022-10-22 – 2022-10-26 (×27): 1 mg via INTRAVENOUS
  Filled 2022-10-22 (×27): qty 1

## 2022-10-22 MED ORDER — GABAPENTIN 300 MG PO CAPS
300.0000 mg | ORAL_CAPSULE | Freq: Three times a day (TID) | ORAL | Status: DC
  Administered 2022-10-22 – 2022-10-28 (×18): 300 mg via ORAL
  Filled 2022-10-22 (×18): qty 1

## 2022-10-22 MED ORDER — ALBUTEROL SULFATE (2.5 MG/3ML) 0.083% IN NEBU
2.5000 mg | INHALATION_SOLUTION | Freq: Two times a day (BID) | RESPIRATORY_TRACT | Status: DC
  Administered 2022-10-22: 2.5 mg via RESPIRATORY_TRACT
  Filled 2022-10-22 (×2): qty 3

## 2022-10-22 MED ORDER — KETOROLAC TROMETHAMINE 15 MG/ML IJ SOLN
30.0000 mg | Freq: Four times a day (QID) | INTRAMUSCULAR | Status: DC
  Administered 2022-10-22 – 2022-10-23 (×4): 30 mg via INTRAVENOUS
  Filled 2022-10-22 (×4): qty 2

## 2022-10-22 MED ORDER — ENOXAPARIN SODIUM 40 MG/0.4ML IJ SOSY
40.0000 mg | PREFILLED_SYRINGE | Freq: Every day | INTRAMUSCULAR | Status: DC
  Administered 2022-10-22 – 2022-10-28 (×7): 40 mg via SUBCUTANEOUS
  Filled 2022-10-22 (×7): qty 0.4

## 2022-10-22 NOTE — Progress Notes (Signed)
Pharmacy Antibiotic Note  Julie King is a 32 y.o. female admitted on 10/14/2022 with pneumonia.  Now s/p VATS decortication per CVTS on 2/23 for parapneumonic effusion. Surgical cultures are currently no growth. Pharmacy has been consulted for vancomycin and cefepime dosing.    Last Vancomycin levels 2/21 and renal function has remained stable since that time. Patient is currently afebrile, but with small increase in WBC, which may be reactive post-op. Oxygen requirement has remained stable.  Plan: Cont vancomycin '1500mg'$  IV Q12H Continue cefepime 2g IV Q8H F/U C/S, renal function, vancomycin levels as needed   Height: '5\' 7"'$  (170.2 cm) Weight: (!) 145.2 kg (320 lb) IBW/kg (Calculated) : 61.6  Temp (24hrs), Avg:98.9 F (37.2 C), Min:97.7 F (36.5 C), Max:99.3 F (37.4 C)  Recent Labs  Lab 10/16/22 0116 10/17/22 0939 10/17/22 1732 10/17/22 2357 10/19/22 0149 10/19/22 1205 10/19/22 1459 10/20/22 0716 10/21/22 0629 10/22/22 0436  WBC 21.2* 19.6* 15.5*  --  10.7*  --   --  11.3* 13.0* 17.1*  CREATININE 0.63 0.65  --   --   --   --   --  0.64  --  0.71  VANCOTROUGH  --   --   --  6*  --   --  11*  --   --   --   VANCOPEAK  --   --  16*  --   --  18*  --   --   --   --      Estimated Creatinine Clearance: 152.8 mL/min (by C-G formula based on SCr of 0.71 mg/dL).    Allergies  Allergen Reactions   Percocet [Oxycodone-Acetaminophen] Hives, Itching and Nausea Only   Pineapple Itching    Fresh pineapples; pineapples from a can is okay    2/16 azithromycin >> 2/18 2/16 CTX >> 2/17 2/17 vancomycin >> 2/17 cefepime >>  Adria Dill, PharmD PGY-2 Infectious Diseases Resident  10/22/2022 2:26 PM

## 2022-10-22 NOTE — Progress Notes (Addendum)
Physical Therapy Treatment Patient Details Name: Julie King MRN: [id number] DOB: 1991-03-22 Today's Date: 10/22/2022   History of Present Illness 32 year old with recently diagnosed lingular mass, robotic biopsy 06/2022 negative for malignancy, repeat CT scans with increasing size and hemoptysis. 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   Admitted due to L side chest pain, fever, cough and orthopnea found to have L pleural effusion.  Underwent VATS and L chest tube placement 2/23.    PT Comments    Pt received in bed, reporting pain. Pt underwent VATS with L chest tube placement yesterday. With encouragement, agreeable to participation in therapy. She required min assist bed mobility, min assist sit to stand with RW, and min guard assist sidestepping with RW bedside. Pt tolerating sitting EOB x 15 minutes prior to return to supine. SpO2 97-100% on 6L. CT on wall suction. Bed linens changed due to drainage from chest tube noted after sitting up. RN notified. Pt supine in bed at end of session.    Recommendations for follow up therapy are one component of a multi-disciplinary discharge planning process, led by the attending physician.  Recommendations may be updated based on patient status, additional functional criteria and insurance authorization.  Follow Up Recommendations  Home health PT     Assistance Recommended at Discharge Intermittent Supervision/Assistance  Patient can return home with the following A little help with walking and/or transfers;Assistance with cooking/housework;Assist for transportation;Help with stairs or ramp for entrance;A little help with bathing/dressing/bathroom   Equipment Recommendations  None recommended by PT (pending progress, may need RW vs cane)    Recommendations for Other Services       Precautions / Restrictions Precautions Precautions: Other (comment) Precaution Comments: watch vitals, L chest tube (2 insertion  sites), central line     Mobility  Bed Mobility Overal bed mobility: Needs Assistance Bed Mobility: Supine to Sit, Rolling, Sit to Sidelying Rolling: Min assist   Supine to sit: Min assist, HOB elevated   Sit to sidelying: Min assist General bed mobility comments: +rail, increased time, cues for sequencing, assist to elevate trunk, assist with BLE back to bed    Transfers Overall transfer level: Needs assistance Equipment used: Rolling walker (2 wheels) Transfers: Sit to/from Stand Sit to Stand: Min assist, From elevated surface                Ambulation/Gait Ambulation/Gait assistance: Min guard   Assistive device: Rolling walker (2 wheels)         General Gait Details: sidestepping with RW bedside prior to return to bed   Stairs             Wheelchair Mobility    Modified Rankin (Stroke Patients Only)       Balance Overall balance assessment: Needs assistance Sitting-balance support: Feet supported, No upper extremity supported Sitting balance-Leahy Scale: Good Sitting balance - Comments: Pt sat EOB x 15 minutes supervision   Standing balance support: Bilateral upper extremity supported, During functional activity Standing balance-Leahy Scale: Fair                              Cognition Arousal/Alertness: Awake/alert Behavior During Therapy: WFL for tasks assessed/performed Overall Cognitive Status: Within Functional Limits for tasks assessed  General Comments: anxious due to pain        Exercises      General Comments General comments (skin integrity, edema, etc.): SpO2 97-100% on 6L      Pertinent Vitals/Pain Pain Assessment Pain Assessment: 0-10 Pain Score: 8  Pain Location: L flank and back Pain Descriptors / Indicators: Discomfort, Grimacing, Sharp, Stabbing, Pressure Pain Intervention(s): Monitored during session, Repositioned, Limited activity within patient's  tolerance, Premedicated before session    Home Living                          Prior Function            PT Goals (current goals can now be found in the care plan section) Acute Rehab PT Goals Patient Stated Goal: decrease pain Progress towards PT goals: Not progressing toward goals - comment (increased pain follow yesterday's procedure)    Frequency    Min 3X/week      PT Plan Current plan remains appropriate    Co-evaluation              AM-PAC PT "6 Clicks" Mobility   Outcome Measure  Help needed turning from your back to your side while in a flat bed without using bedrails?: A Little Help needed moving from lying on your back to sitting on the side of a flat bed without using bedrails?: A Little Help needed moving to and from a bed to a chair (including a wheelchair)?: A Little Help needed standing up from a chair using your arms (e.g., wheelchair or bedside chair)?: A Little Help needed to walk in hospital room?: A Little Help needed climbing 3-5 steps with a railing? : A Lot 6 Click Score: 17    End of Session Equipment Utilized During Treatment: Oxygen Activity Tolerance: Patient tolerated treatment well Patient left: in bed;with call bell/phone within reach;with family/visitor present Nurse Communication: Mobility status PT Visit Diagnosis: Muscle weakness (generalized) (M62.81);Difficulty in walking, not elsewhere classified (R26.2)     Time: [id number] PT Time Calculation (min) (ACUTE ONLY): 30 min  Charges:  $Therapeutic Activity: 23-37 mins                     Gloriann Loan., PT  Office # [phone]    Lorriane Shire 10/22/2022, 1:41 PM

## 2022-10-22 NOTE — Progress Notes (Signed)
[facility]                                                                              Julie King, is a 32 y.o. female, DOB - [birth year], [id number] Admit date - 10/14/2022    Outpatient Primary MD for the patient is Songer, Alda Berthold, MD  LOS - 8  days  Chief Complaint  Patient presents with   Chest Pain   Back Pain       Brief summary   32 year old with recently diagnosed lingular mass, robotic biopsy 06/2022 negative for malignancy, repeat CT scans with increasing size and hemoptysis.   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  Pathology negative for malignancy and showed inflammation with focal necrosis, fibrosis and pneumocyte hyperplasia.  Discharge home after surgery.     Presented back to ED with 3 days of worsening left-sided chest pain, subjective fever, cough, nausea and orthopnea. CTA with postsurgical changes small left pleural effusion. Due to persistent leukocytosis and febrile, chest x-ray was repeated on 2/19 which showed increased effusion on the left side. IR guided thoracentesis 500 mL removed, no growth so far.  Underwent VATS on 2/23   Assessment & Plan    Principal Problem: Sepsis present on admission, left lower lobe pneumonia with complicated parapneumonic effusion with pleurisy Postop pleural space infection -Completed Zithromax for 3 days, continue broad-spectrum antibiotics including vancomycin and cefepime -Blood cultures NTD, sputum cultures 2/17 showed respiratory flora.  Surgical cultures from 2/8 with Streptococcus. -Fluid culture from thoracentesis 2/19 showed no growth. -CTA negative for PE, Doppler ultrasound negative for DVT -CT surgery following, underwent VATS and drainage of left pleural effusion on 2/23 -Chest tubes in place, on 6 L O2 via [place], wean O2 as tolerated, management per CT surgery. -Continue incentive spirometry, flutter valve -Complaining of significant pain, increased Neurontin,  continue Toradol, scheduled, continue IV Dilaudid, oxy as needed   Active problems   Acute on chronic microcytic anemia, iron deficiency  - Baseline hemoglobin about 9-10.  Iron 16, ferritin 104, percent saturation ratio 8 -Hemoglobin 7.6, improving from 7.1 yesterday -Follow H&H closely, transfuse for hemoglobin less than 7.5  Leukocytosis -Current broad-spectrum antibiotics, follow cultures -Obtain procalcitonin, differential, follow for any fevers    Morbid obesity Estimated body mass index is 50.12 kg/m as calculated from the following:   Height as of this encounter: '5\' 7"'$  (1.702 m).   Weight as of this encounter: 145.2 kg.  Code Status: Full code DVT Prophylaxis:  enoxaparin (LOVENOX) injection 40 mg Start: 10/22/22 1400 SCD's Start: 10/21/22 1721   Level of Care: Level of care: Telemetry Medical Family Communication: Updated patient Disposition Plan:      Remains inpatient appropriate:     Procedures:   VIDEO ASSISTED THORACOSCOPY (Left) DRAINAGE OF PLEURAL EFFUSION (Left) DECORTICATION (Left)  Consultants:   Cardiothoracic surgery  Antimicrobials:   Anti-infectives (From admission, onward)    Start     Dose/Rate Route Frequency Ordered Stop   10/18/22 0800  vancomycin (VANCOREADY) IVPB 1500 mg/300 mL        1,500 mg 150 mL/hr over 120 Minutes Intravenous  Every 8 hours 10/18/22 0147     10/16/22 1230  fluconazole (DIFLUCAN) tablet 200 mg        200 mg Oral  Once 10/16/22 1135 10/16/22 1235   10/16/22 1000  azithromycin (ZITHROMAX) tablet 500 mg        500 mg Oral Daily 10/15/22 1233 10/16/22 0919   10/16/22 0130  vancomycin (VANCOREADY) IVPB 1500 mg/300 mL  Status:  Discontinued        1,500 mg 150 mL/hr over 120 Minutes Intravenous Every 12 hours 10/15/22 1245 10/18/22 0147   10/15/22 1330  vancomycin (VANCOREADY) IVPB 2000 mg/400 mL        2,000 mg 200 mL/hr over 120 Minutes Intravenous  Once 10/15/22 1233 10/15/22 1609   10/15/22 1330  ceFEPIme  (MAXIPIME) 2 g in sodium chloride 0.9 % 100 mL IVPB        2 g 200 mL/hr over 30 Minutes Intravenous Every 8 hours 10/15/22 1233     10/15/22 1000  azithromycin (ZITHROMAX) 500 mg in sodium chloride 0.9 % 250 mL IVPB  Status:  Discontinued        500 mg 250 mL/hr over 60 Minutes Intravenous Every 24 hours 10/14/22 1110 10/15/22 1208   10/15/22 0600  cefTRIAXone (ROCEPHIN) 2 g in sodium chloride 0.9 % 100 mL IVPB  Status:  Discontinued        2 g 200 mL/hr over 30 Minutes Intravenous Every 24 hours 10/14/22 1110 10/15/22 1208   10/14/22 0645  azithromycin (ZITHROMAX) 500 mg in sodium chloride 0.9 % 250 mL IVPB        500 mg 250 mL/hr over 60 Minutes Intravenous  Once 10/14/22 0633 10/14/22 0900   10/14/22 0515  cefTRIAXone (ROCEPHIN) 2 g in sodium chloride 0.9 % 100 mL IVPB        2 g 200 mL/hr over 30 Minutes Intravenous  Once 10/14/22 0506 10/14/22 0622          Medications  acetaminophen  1,000 mg Oral Q6H   Or   acetaminophen (TYLENOL) oral liquid 160 mg/5 mL  1,000 mg Oral Q6H   albuterol  2.5 mg Nebulization BID   bisacodyl  10 mg Oral Daily   Chlorhexidine Gluconate Cloth  6 each Topical Daily   enoxaparin (LOVENOX) injection  40 mg Subcutaneous Daily   gabapentin  300 mg Oral TID   guaiFENesin  600 mg Oral BID   ketorolac  30 mg Intravenous Q6H   miconazole nitrate   Topical BID   pantoprazole  40 mg Oral Daily   polyethylene glycol  17 g Oral Daily   senna-docusate  1 tablet Oral QHS      Subjective:   Julie King was seen and examined today.  Tearful, complaining of pain, 10/10 on her left chest.  No nausea, vomiting, fevers or abdominal pain.  .    Objective:   Vitals:   10/21/22 2334 10/22/22 0225 10/22/22 0820 10/22/22 0933  BP: 119/72 130/81 132/81   Pulse: 93 90 90 (!) 109  Resp: '20 20 20 20  '$ Temp: 99 F (37.2 C)  97.7 F (36.5 C)   TempSrc: Oral Oral Oral   SpO2: 98% 90% 100% 100%  Weight:      Height:        Intake/Output Summary (Last  24 hours) at 10/22/2022 1137 Last data filed at 10/22/2022 0615 Gross per 24 hour  Intake 1915 ml  Output 2110 ml  Net -195 ml  Wt Readings from Last 3 Encounters:  10/21/22 (!) 145.2 kg  10/13/22 (!) 145.2 kg  10/04/22 (!) 145.5 kg     Exam General: Alert and oriented x 3, NAD, tearful Cardiovascular: S1 S2 auscultated,  RRR Respiratory: Diminished left base + chest tube Gastrointestinal: Soft, nontender, nondistended, + bowel sounds Ext: no pedal edema bilaterally Neuro: moving all 4 extremities spontaneously Psych: anxious    Data Reviewed:  I have personally reviewed following labs    CBC Lab Results  Component Value Date   WBC 17.1 (H) 10/22/2022   RBC 4.11 10/22/2022   HGB 7.6 (L) 10/22/2022   HCT 24.4 (L) 10/22/2022   MCV 59.4 (L) 10/22/2022   MCH 18.5 (L) 10/22/2022   PLT 319 10/22/2022   MCHC 31.1 10/22/2022   RDW 22.2 (H) 10/22/2022   LYMPHSABS 2.2 10/21/2022   MONOABS 0.8 10/21/2022   EOSABS 0.5 10/21/2022   BASOSABS 0.1 [id number]     Last metabolic panel Lab Results  Component Value Date   NA 136 10/22/2022   K 4.6 10/22/2022   CL 103 10/22/2022   CO2 26 10/22/2022   BUN 9 10/22/2022   CREATININE 0.71 10/22/2022   GLUCOSE 109 (H) 10/22/2022   GFRNONAA >60 10/22/2022   GFRAA >60 02/04/2020   CALCIUM 8.5 (L) 10/22/2022   PHOS 3.2 10/20/2022   PROT 6.4 (L) 10/20/2022   ALBUMIN 2.2 (L) 10/20/2022   LABGLOB 3.2 01/12/2022   AGRATIO 1.3 01/12/2022   BILITOT 0.3 10/20/2022   ALKPHOS 91 10/20/2022   AST 31 10/20/2022   ALT 39 10/20/2022   ANIONGAP 7 10/22/2022    CBG (last 3)  Recent Labs    10/22/22 1136  GLUCAP 133*      Coagulation Profile: No results for input(s): "INR", "PROTIME" in the last 168 hours.   Radiology Studies: I have personally reviewed the imaging studies  DG Chest Port 1 View  Result Date: 10/22/2022 CLINICAL DATA:  Follow-up left pneumothorax. EXAM: PORTABLE CHEST 1 VIEW COMPARISON:  10/21/2022  FINDINGS: Two left-sided chest tubes remain in place as well as right jugular central venous catheter. No pneumothorax is visualized. Left lung subsegmental atelectasis is again seen as well as probable tiny left pleural effusion. Minimal atelectasis noted in the right lung base, also without change. IMPRESSION: No pneumothorax visualized. Left chest tubes remain in place. Stable left lung subsegmental atelectasis and probable tiny left pleural effusion. Electronically Signed   By: Marlaine Hind M.D.   On: 10/22/2022 09:07   DG Chest Port 1 View  Result Date: 10/21/2022 CLINICAL DATA:  Pneumothorax. Right central line. EXAM: PORTABLE CHEST 1 VIEW COMPARISON:  Radiographs yesterday. CT 10/14/2022 FINDINGS: Two left-sided chest tubes have been placed. Decreased left pleural effusion with residual [place] opacity in the left mid lower lung zone, likely residual fusion. No convincing pneumothorax. There is subcutaneous emphysema in the left lateral chest wall. Right internal jugular central venous catheter tip overlies the mid SVC. There is no right pneumothorax. Ill-defined opacity at the right lung base. No large right pleural effusion. Grossly stable heart size and mediastinal contours. IMPRESSION: 1. Placement of 2 left-sided chest tubes with decreased left pleural effusion. Residual hazy opacity in the left mid and lower lung zone. No convincing pneumothorax. Subcutaneous emphysema in the left lateral chest wall. 2. Right internal jugular central venous catheter tip overlies the SVC. No right-sided pneumothorax. 3. Ill-defined opacity at the right lung base, similar to recent CT. Electronically Signed   By: Threasa Beards  [place] M.D.   On: 10/21/2022 17:38       Cari Vandeberg M.D. [facility] 10/22/2022, 11:37 AM  Available via Epic secure chat 7am-7pm After 7 pm, please refer to night coverage provider listed on amion.

## 2022-10-22 NOTE — Discharge Instructions (Signed)
racoscopy, Care After The following information offers guidance on how to care for yourself after your procedure. Your health care provider may also give you more specific instructions. If you have problems or questions, contact your health care provider. What can I expect after the procedure? After the procedure, it is common to have pain and soreness in the surgical area. Follow these instructions at home: Incision care  Follow instructions from your health care provider about how to take care of your incisions. Make sure you: Wash your hands with soap and water for at least 20 seconds before and after you change your bandage (dressing). If soap and water are not available, use hand sanitizer. Change your dressing as told by your health care provider. Leave stitches (sutures), staples, skin glue, or adhesive strips in place. These skin closures may need to stay in place for 2 weeks or longer. If adhesive strip edges start to loosen and curl up, you may trim the loose edges. Do not remove adhesive strips completely unless your health care provider tells you to do that. Check your incision areas every day for signs of infection. Check for: More redness, swelling, or pain. Fluid or blood. Warmth. Pus or a bad smell. Do not take baths, swim, or use a hot tub until your health care provider approves. You may take showers. Medicines Take over-the-counter and prescription medicines only as told by your health care provider. If you were prescribed an antibiotic medicine, take it as told by your health care provider. Do not stop taking the antibiotic even if you start to feel better. Ask your health care provider if the medicine prescribed to you: Requires you to avoid driving or using machinery. Can cause constipation. You may need to take these actions to prevent or treat constipation: Drink enough fluid to keep your urine pale yellow. Take over-the-counter or prescription medicines. Eat foods  that are high in fiber, such as beans, whole grains, and fresh fruits and vegetables. Limit foods that are high in fat and processed sugars, such as fried or sweet foods. Preventing lung infection To prevent pneumonia and to keep your lungs healthy: Try to cough often. If it hurts to cough, hold a pillow against your chest as you cough. Take deep breaths or do breathing exercises as told by your health care provider. If you were given an incentive spirometer, use it as told by your health care provider. General instructions You may have to avoid lifting. Ask your health care provider how much you can safely lift. Do not use any products that contain nicotine or tobacco. These products include cigarettes, chewing tobacco, and vaping devices, such as e-cigarettes. These can delay healing after the procedure. If you need help quitting, ask your health care provider. Avoid driving until your health care provider approves. If you have a chest drainage tube, care for it as told by your health care provider. Do not travel by airplane after the chest drainage tube is removed until your health care provider approves. Keep all follow-up visits. This is important. Contact a health care provider if: You have a fever. Pain medicines do not ease your pain. You have more redness, swelling, or pain around your incision area. You have fluid or blood coming from your incision area. An incision feels warm to the touch. You develop a cough that does not go away, or you are coughing up mucus that is yellow or green. You have pus or a bad smell coming from an incision or  dressing. Get help right away if: You cough up blood. You develop light-headedness, or you feel faint. You have difficulty breathing or an increased shortness of breath. You develop chest pain. Your heartbeat feels irregular or very fast. These symptoms may be an emergency. Get help right away. Call 911. Do not wait to see if the symptoms will  go away. Do not drive yourself to the hospital. Summary Follow instructions from your health care provider about how to take care of your incisions. Ask your health care provider if the medicine prescribed to you requires you to avoid driving or using machinery. Leave stitches (sutures), staples, skin glue, or adhesive strips in place. Check your incision areas every day for signs of infection. This information is not intended to replace advice given to you by your health care provider. Make sure you discuss any questions you have with your health care provider. Document Revised: 03/10/2021 Document Reviewed: 03/10/2021 Elsevier Patient Education  [employer].

## 2022-10-22 NOTE — Progress Notes (Addendum)
      [address]       [place] [address]             [phone]       1 Day Post-Op Procedure(s) (LRB): VIDEO ASSISTED THORACOSCOPY (Left) DRAINAGE OF PLEURAL EFFUSION (Left) DECORTICATION (Left)  Subjective: Patient with a fair amount of incisional/chest tube pain.  Objective: Vital signs in last 24 hours: Temp:  [98.6 F (37 C)-99.6 F (37.6 C)] 99 F (37.2 C) (02/23 2334) Pulse Rate:  [90-111] 90 (02/24 0225) Cardiac Rhythm: Sinus tachycardia (02/23 2045) Resp:  [19-38] 20 (02/24 0225) BP: (112-170)/(58-96) 130/81 (02/24 0225) SpO2:  [90 %-100 %] 90 % (02/24 0225) Arterial Line BP: (128-191)/(74-123) 149/74 (02/23 1832) Weight:  [145.2 kg] 145.2 kg (02/23 1026)    Intake/Output from previous day: 02/23 0701 - 02/24 0700 In: 3115 [I.V.:1600; Blood:315; IV Piggyback:1200] Out: [id number] [Urine:1425; Blood:150; Chest Tube:140]   Physical Exam:  Cardiovascular: RRR Pulmonary: Clear to auscultation on right and diminished left base Abdomen: Soft, non tender, bowel sounds present. Extremities: SCDs in place Wounds: Clean and dry.  No erythema or signs of infection. Sero sanguinous drainage around chest tube wounds Chest Tubes: to suction, no air leak  Lab Results: CBC: Recent Labs    10/21/22 0629 10/22/22 0436  WBC 13.0* 17.1*  HGB 7.1* 7.6*  HCT 24.1* 24.4*  PLT 309 319   BMET:  Recent Labs    10/20/22 0716 10/22/22 0436  NA 138 136  K 3.2* 4.6  CL 105 103  CO2 24 26  GLUCOSE 127* 109*  BUN 5* 9  CREATININE 0.64 0.71  CALCIUM 8.4* 8.5*    PT/INR: No results for input(s): "LABPROT", "INR" in the last 72 hours. ABG:  INR: Will add last result for INR, ABG once components are confirmed Will add last 4 CBG results once components are confirmed  Assessment/Plan:  1. CV - SR. 2.  Pulmonary - On 3 liters of oxygen via [place]. Wean as able. Chest tubes with 150 cc of output last 24 hours. Chest tubes are to suction, no air leak. CXR  this am appears to show left base atelectasis/small effusion. Chest tubes to water seal today;? Remove anterior tube or wait until am. Gram stain showed no organisms and culture shows no growth < 24 hours. Encourage incentive spirometer and flutter valve. 3. ID-on Vancomycin and Cefepime. WBC this am increased to 17,100 4. Anemia-H and H this am stable at 7.5 and 25.1 5. On Lovenox for DVT prophylaxis 6. Will increase Neurontin and Toradol for pain control.    Lenin Kuhnle M ZimmermanPA-C 10/22/2022,7:39 AM

## 2022-10-23 ENCOUNTER — Inpatient Hospital Stay (HOSPITAL_COMMUNITY): Payer: Medicaid Other

## 2022-10-23 DIAGNOSIS — D509 Iron deficiency anemia, unspecified: Secondary | ICD-10-CM

## 2022-10-23 DIAGNOSIS — R6510 Systemic inflammatory response syndrome (SIRS) of non-infectious origin without acute organ dysfunction: Secondary | ICD-10-CM | POA: Diagnosis not present

## 2022-10-23 DIAGNOSIS — J90 Pleural effusion, not elsewhere classified: Secondary | ICD-10-CM | POA: Diagnosis not present

## 2022-10-23 DIAGNOSIS — R072 Precordial pain: Secondary | ICD-10-CM | POA: Diagnosis not present

## 2022-10-23 LAB — CBC WITH DIFFERENTIAL/PLATELET
Abs Immature Granulocytes: 0.07 10*3/uL (ref 0.00–0.07)
Basophils Absolute: 0.1 10*3/uL (ref 0.0–0.1)
Basophils Relative: 1 %
Eosinophils Absolute: 0.4 10*3/uL (ref 0.0–0.5)
Eosinophils Relative: 4 %
HCT: 25.1 % — ABNORMAL LOW (ref 36.0–46.0)
Hemoglobin: 7.5 g/dL — ABNORMAL LOW (ref 12.0–15.0)
Immature Granulocytes: 1 %
Lymphocytes Relative: 20 %
Lymphs Abs: 2.1 10*3/uL (ref 0.7–4.0)
MCH: 18.3 pg — ABNORMAL LOW (ref 26.0–34.0)
MCHC: 29.9 g/dL — ABNORMAL LOW (ref 30.0–36.0)
MCV: 61.2 fL — ABNORMAL LOW (ref 80.0–100.0)
Monocytes Absolute: 0.7 10*3/uL (ref 0.1–1.0)
Monocytes Relative: 6 %
Neutro Abs: 7.3 10*3/uL (ref 1.7–7.7)
Neutrophils Relative %: 68 %
Platelets: 322 10*3/uL (ref 150–400)
RBC: 4.1 MIL/uL (ref 3.87–5.11)
RDW: 22.5 % — ABNORMAL HIGH (ref 11.5–15.5)
WBC: 10.6 10*3/uL — ABNORMAL HIGH (ref 4.0–10.5)
nRBC: 1 % — ABNORMAL HIGH (ref 0.0–0.2)

## 2022-10-23 LAB — COMPREHENSIVE METABOLIC PANEL
ALT: 25 U/L (ref 0–44)
AST: 15 U/L (ref 15–41)
Albumin: 2.1 g/dL — ABNORMAL LOW (ref 3.5–5.0)
Alkaline Phosphatase: 75 U/L (ref 38–126)
Anion gap: 8 (ref 5–15)
BUN: <5 mg/dL — ABNORMAL LOW (ref 6–20)
CO2: 25 mmol/L (ref 22–32)
Calcium: 8.4 mg/dL — ABNORMAL LOW (ref 8.9–10.3)
Chloride: 106 mmol/L (ref 98–111)
Creatinine, Ser: 0.57 mg/dL (ref 0.44–1.00)
GFR, Estimated: >60 mL/min (ref >60)
Glucose, Bld: 87 mg/dL (ref 70–99)
Potassium: 4.1 mmol/L (ref 3.5–5.1)
Sodium: 139 mmol/L (ref 135–145)
Total Bilirubin: 0.6 mg/dL (ref 0.3–1.2)
Total Protein: 6 g/dL — ABNORMAL LOW (ref 6.5–8.1)

## 2022-10-23 LAB — PROCALCITONIN: Procalcitonin: <0.1 ng/mL

## 2022-10-23 MED ORDER — KETOROLAC TROMETHAMINE 15 MG/ML IJ SOLN
30.0000 mg | Freq: Four times a day (QID) | INTRAMUSCULAR | Status: AC
  Administered 2022-10-23 – 2022-10-25 (×8): 30 mg via INTRAVENOUS
  Filled 2022-10-23 (×9): qty 2

## 2022-10-23 NOTE — Progress Notes (Signed)
[facility]                                                                              Julie King, is a 32 y.o. 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  Pathology negative for malignancy and showed inflammation with focal necrosis, fibrosis and pneumocyte hyperplasia.  Discharge home after surgery.     Presented back to ED with 3 days of worsening left-sided chest pain, subjective fever, cough, nausea and orthopnea. CTA with postsurgical changes small left pleural effusion. Due to persistent leukocytosis and febrile, chest x-ray was repeated on 2/19 which showed increased effusion on the left side. IR guided thoracentesis 500 mL removed, no growth so far.  Underwent VATS on 2/23   Assessment & Plan    Principal Problem: Sepsis present on admission, left lower lobe pneumonia with complicated parapneumonic effusion with pleurisy Postop pleural space infection -Completed Zithromax for 3 days, continue broad-spectrum antibiotics including vancomycin and cefepime -Blood cultures NTD, sputum cultures 2/17 showed respiratory flora.  Surgical cultures from 2/8 with Streptococcus. -Fluid culture from thoracentesis 2/19 showed no growth. -CTA negative for PE, Doppler ultrasound negative for DVT -CT surgery following, underwent VATS and drainage of left pleural effusion on 2/23, cultures negative so far -Chest tubes in place, pain better controlled today, O2 weaned down to 3 L  -Continue I-S, flutter valve -Pain better controlled today  Active problems   Acute  on chronic microcytic anemia, iron deficiency  - Baseline hemoglobin about 9-10.  Iron 16, ferritin 104, percent saturation ratio 8 -Hemoglobin 7.5, follow H&H in a.m., transfuse if less than 7.5  Leukocytosis -Current broad-spectrum antibiotics, follow cultures -Procalcitonin less than 0.1, WBCs improving    Morbid obesity Estimated body mass index is 50.12 kg/m as calculated from the following:   Height as of this encounter: '5\' 7"'$  (1.702 m).   Weight as of this encounter: 145.2 kg.  Code Status: Full code DVT Prophylaxis:  enoxaparin (LOVENOX) injection 40 mg Start: 10/22/22 1400 SCD's Start: 10/21/22 1721   Level of Care: Level of care: Telemetry Medical Family Communication: Updated patient Disposition Plan:      Remains inpatient appropriate:     Procedures:   VIDEO ASSISTED THORACOSCOPY (Left) DRAINAGE OF PLEURAL EFFUSION (Left) DECORTICATION (Left)  Consultants:   Cardiothoracic surgery  Antimicrobials:   Anti-infectives (From admission, onward)    Start     Dose/Rate Route Frequency Ordered Stop   10/18/22 0800  vancomycin (VANCOREADY) IVPB 1500 mg/300 mL        1,500 mg 150 mL/hr over 120 Minutes Intravenous Every 8 hours 10/18/22 0147     10/16/22 1230  fluconazole (DIFLUCAN) tablet 200 mg  200 mg Oral  Once 10/16/22 1135 10/16/22 1235   10/16/22 1000  azithromycin (ZITHROMAX) tablet 500 mg        500 mg Oral Daily 10/15/22 1233 10/16/22 0919   10/16/22 0130  vancomycin (VANCOREADY) IVPB 1500 mg/300 mL  Status:  Discontinued        1,500 mg 150 mL/hr over 120 Minutes Intravenous Every 12 hours 10/15/22 1245 10/18/22 0147   10/15/22 1330  vancomycin (VANCOREADY) IVPB 2000 mg/400 mL        2,000 mg 200 mL/hr over 120 Minutes Intravenous  Once 10/15/22 1233 10/15/22 1609   10/15/22 1330  ceFEPIme (MAXIPIME) 2 g in sodium chloride 0.9 % 100 mL IVPB        2 g 200 mL/hr over 30 Minutes Intravenous Every 8 hours 10/15/22 1233     10/15/22 1000   azithromycin (ZITHROMAX) 500 mg in sodium chloride 0.9 % 250 mL IVPB  Status:  Discontinued        500 mg 250 mL/hr over 60 Minutes Intravenous Every 24 hours 10/14/22 1110 10/15/22 1208   10/15/22 0600  cefTRIAXone (ROCEPHIN) 2 g in sodium chloride 0.9 % 100 mL IVPB  Status:  Discontinued        2 g 200 mL/hr over 30 Minutes Intravenous Every 24 hours 10/14/22 1110 10/15/22 1208   10/14/22 0645  azithromycin (ZITHROMAX) 500 mg in sodium chloride 0.9 % 250 mL IVPB        500 mg 250 mL/hr over 60 Minutes Intravenous  Once 10/14/22 0633 10/14/22 0900   10/14/22 0515  cefTRIAXone (ROCEPHIN) 2 g in sodium chloride 0.9 % 100 mL IVPB        2 g 200 mL/hr over 30 Minutes Intravenous  Once 10/14/22 0506 10/14/22 0622          Medications  acetaminophen  1,000 mg Oral Q6H   Or   acetaminophen (TYLENOL) oral liquid 160 mg/5 mL  1,000 mg Oral Q6H   bisacodyl  10 mg Oral Daily   Chlorhexidine Gluconate Cloth  6 each Topical Daily   enoxaparin (LOVENOX) injection  40 mg Subcutaneous Daily   gabapentin  300 mg Oral TID   guaiFENesin  600 mg Oral BID   ketorolac  30 mg Intravenous Q6H   miconazole nitrate   Topical BID   pantoprazole  40 mg Oral Daily   polyethylene glycol  17 g Oral Daily   senna-docusate  1 tablet Oral QHS      Subjective:   Julie King was seen and examined today.  Pain improving today, 6/10.  No acute complaints.  Tolerating diet.  No fevers or chills, nausea vomiting or shortness of breath.    Objective:   Vitals:   10/22/22 2320 10/23/22 0323 10/23/22 0847 10/23/22 1009  BP: 134/83 131/78    Pulse: 90 88    Resp: 20 (!) 23  (!) 22  Temp: 98.7 F (37.1 C) 98.8 F (37.1 C) 99.1 F (37.3 C)   TempSrc: Oral Oral Oral   SpO2: 97% 99%    Weight:      Height:        Intake/Output Summary (Last 24 hours) at 10/23/2022 1033 Last data filed at 10/23/2022 0710 Gross per 24 hour  Intake 1775.74 ml  Output 1525 ml  Net 250.74 ml     Wt Readings from  Last 3 Encounters:  10/21/22 (!) 145.2 kg  10/13/22 (!) 145.2 kg  10/04/22 (!) 145.5 kg  Physical Exam General: Alert and oriented x 3, NAD Cardiovascular: S1 S2 clear, RRR.  Respiratory: Diminished left base, + chest tube Gastrointestinal: Soft, nontender, nondistended, NBS Ext: no pedal edema bilaterally Neuro: no new deficits Psych: Normal affect    Data Reviewed:  I have personally reviewed following labs    CBC Lab Results  Component Value Date   WBC 10.6 (H) 10/23/2022   RBC 4.10 10/23/2022   HGB 7.5 (L) 10/23/2022   HCT 25.1 (L) 10/23/2022   MCV 61.2 (L) 10/23/2022   MCH 18.3 (L) 10/23/2022   PLT 322 10/23/2022   MCHC 29.9 (L) 10/23/2022   RDW 22.5 (H) 10/23/2022   LYMPHSABS 2.1 10/23/2022   MONOABS 0.7 10/23/2022   EOSABS 0.4 10/23/2022   BASOSABS 0.1 [id number]     Last metabolic panel Lab Results  Component Value Date   NA 139 10/23/2022   K 4.1 10/23/2022   CL 106 10/23/2022   CO2 25 10/23/2022   BUN <5 (L) 10/23/2022   CREATININE 0.57 10/23/2022   GLUCOSE 87 10/23/2022   GFRNONAA >60 10/23/2022   GFRAA >60 02/04/2020   CALCIUM 8.4 (L) 10/23/2022   PHOS 3.2 10/20/2022   PROT 6.0 (L) 10/23/2022   ALBUMIN 2.1 (L) 10/23/2022   LABGLOB 3.2 01/12/2022   AGRATIO 1.3 01/12/2022   BILITOT 0.6 10/23/2022   ALKPHOS 75 10/23/2022   AST 15 10/23/2022   ALT 25 10/23/2022   ANIONGAP 8 10/23/2022    CBG (last 3)  Recent Labs    10/22/22 1136 10/22/22 1612  GLUCAP 133* 203*      Coagulation Profile: No results for input(s): "INR", "PROTIME" in the last 168 hours.   Radiology Studies: I have personally reviewed the imaging studies  DG Chest Port 1 View  Result Date: 10/22/2022 CLINICAL DATA:  Follow-up left pneumothorax. EXAM: PORTABLE CHEST 1 VIEW COMPARISON:  10/21/2022 FINDINGS: Two left-sided chest tubes remain in place as well as right jugular central venous catheter. No pneumothorax is visualized. Left lung subsegmental atelectasis is  again seen as well as probable tiny left pleural effusion. Minimal atelectasis noted in the right lung base, also without change. IMPRESSION: No pneumothorax visualized. Left chest tubes remain in place. Stable left lung subsegmental atelectasis and probable tiny left pleural effusion. Electronically Signed   By: Marlaine Hind M.D.   On: 10/22/2022 09:07   DG Chest Port 1 View  Result Date: 10/21/2022 CLINICAL DATA:  Pneumothorax. Right central line. EXAM: PORTABLE CHEST 1 VIEW COMPARISON:  Radiographs yesterday. CT 10/14/2022 FINDINGS: Two left-sided chest tubes have been placed. Decreased left pleural effusion with residual [place] opacity in the left mid lower lung zone, likely residual fusion. No convincing pneumothorax. There is subcutaneous emphysema in the left lateral chest wall. Right internal jugular central venous catheter tip overlies the mid SVC. There is no right pneumothorax. Ill-defined opacity at the right lung base. No large right pleural effusion. Grossly stable heart size and mediastinal contours. IMPRESSION: 1. Placement of 2 left-sided chest tubes with decreased left pleural effusion. Residual hazy opacity in the left mid and lower lung zone. No convincing pneumothorax. Subcutaneous emphysema in the left lateral chest wall. 2. Right internal jugular central venous catheter tip overlies the SVC. No right-sided pneumothorax. 3. Ill-defined opacity at the right lung base, similar to recent CT. Electronically Signed   By: Keith Rake M.D.   On: 10/21/2022 17:38       Julie King M.D. [facility] 10/23/2022, 10:33 AM  Available via  Epic secure chat 7am-7pm After 7 pm, please refer to night coverage provider listed on amion.

## 2022-10-23 NOTE — Progress Notes (Addendum)
      [address]       [place] [address]             [phone]       2 Days Post-Op Procedure(s) (LRB): VIDEO ASSISTED THORACOSCOPY (Left) DRAINAGE OF PLEURAL EFFUSION (Left) DECORTICATION (Left)  Subjective: Patient states pain is somewhat better today than yesterday. Per nurse, drainage around chest tube wounds yesterday.   Objective: Vital signs in last 24 hours: Temp:  [98.1 F (36.7 C)-99 F (37.2 C)] 98.8 F (37.1 C) (02/25 0323) Pulse Rate:  [88-109] 88 (02/25 0323) Cardiac Rhythm: Normal sinus rhythm (02/24 1950) Resp:  [20-23] 23 (02/25 0323) BP: (131-146)/(77-86) 131/78 (02/25 0323) SpO2:  [95 %-100 %] 99 % (02/25 0323)    Intake/Output from previous day: 02/24 0701 - 02/25 0700 In: 1912.3 [P.O.:480; I.V.:1432.3] Out: 1525 [Urine:1375; Chest Tube:150]   Physical Exam: Cardiovascular: RRR Pulmonary: Clear to auscultation on right and diminished left base Abdomen: Soft, non tender, bowel sounds present. Extremities: SCDs in place Wounds: Clean and dry.  No erythema or signs of infection. Sero sanguinous drainage around chest tube wounds Chest Tubes: to suction, no air leak    Lab Results: CBC: Recent Labs    10/22/22 0436 10/23/22 0435  WBC 17.1* 10.6*  HGB 7.6* 7.5*  HCT 24.4* 25.1*  PLT 319 322   BMET:  Recent Labs    10/22/22 0436 10/23/22 0435  NA 136 139  K 4.6 4.1  CL 103 106  CO2 26 25  GLUCOSE 109* 87  BUN 9 <5*  CREATININE 0.71 0.57  CALCIUM 8.5* 8.4*    PT/INR: No results for input(s): "LABPROT", "INR" in the last 72 hours. ABG:  INR: Will add last result for INR, ABG once components are confirmed Will add last 4 CBG results once components are confirmed  Assessment/Plan: Assessment/Plan:   1. CV - SR. 2.  Pulmonary - On 3 liters of oxygen via [place]. Wean as able. Chest tubes with 150 cc of output last 24 hours. Chest tubes are to suction, no air leak. CXR this am appears to show left base  atelectasis/small effusion. Chest tubes to water seal today;? Remove anterior tube or wait until am. Gram stain showed no organisms and culture shows no growth < 24 hours. Encourage incentive spirometer and flutter valve. 3. ID-on Vancomycin and Cefepime. WBC this am decreased to 10,600  4. Anemia-H and H this am stable at 7.5 and 25.1 5. On Lovenox for DVT prophylaxis 6. Chest tube dressing changes as ordered     Donielle M ZimmermanPA-C 10/22/2022,7:39 AM   Donielle M ZimmermanPA-C 10/23/2022,8:34 AM  Agree with above Encouraging ambulation Will keep tubes for now  Julie King O Julie King

## 2022-10-23 NOTE — Progress Notes (Signed)
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   Admitted due to L side chest pain, fever, cough and orthopnea found to have L pleural effusion.  Underwent VATS and L chest tube placement 2/23.   OT comments  Pt. Seen for skilled OT treatment session.  Eager for b/d and toileting in the b.room vs. 3n1.  Pt. Able to complete bed mobility but required heavy reliance on bed rails and hob elevation.  Max a for peri care secondary to pain associated with chest tube and movements required to reach buttocks.  Pt. Completed full ub/lb bathing and ambulation to/from b.room for toileting.  Making good gains.  Agree with current d/c recommendations.     Recommendations for follow up therapy are one component of a multi-disciplinary discharge planning process, led by the attending physician.  Recommendations may be updated based on patient status, additional functional criteria and insurance authorization.    Follow Up Recommendations  Home health OT     Assistance Recommended at Discharge Intermittent Supervision/Assistance  Patient can return home with the following  Direct supervision/assist for medications management;Direct supervision/assist for financial management;Assist for transportation;Help with stairs or ramp for entrance;Assistance with cooking/housework;A little help with walking and/or transfers;A little help with bathing/dressing/bathroom   Equipment Recommendations  BSC/3in1    Recommendations for Other Services      Precautions / Restrictions Precautions Precaution Comments: watch vitals, L chest tube (2 insertion sites), central line       Mobility Bed  Mobility Overal bed mobility: Needs Assistance Bed Mobility: Supine to Sit, Sit to Supine     Supine to sit: Min assist, HOB elevated Sit to supine: Supervision   General bed mobility comments: pt. requires rail use and would not attempt other ways without rails for prep for home.  hob elevated for oob and into bed    Transfers Overall transfer level: Needs assistance Equipment used: Rolling walker (2 wheels) Transfers: Sit to/from Stand, Bed to chair/wheelchair/BSC Sit to Stand: Min assist, From elevated surface Stand pivot transfers: Min guard               Balance                                           ADL either performed or assessed with clinical judgement   ADL Overall ADL's : Needs assistance/impaired     Grooming: Applying deodorant;Minimal assistance;Bed level Grooming Details (indicate cue type and reason): difficulty using LUE Upper Body Bathing: Set up;Sitting   Lower Body Bathing: Maximal assistance;Sit to/from stand Lower Body Bathing Details (indicate cue type and reason): able to reach all areas but required assistance with buttocks. difficulty reaching due to chest tube causing pain with twisting motion Upper Body Dressing : Minimal assistance;Sitting       Toilet Transfer: Min guard;Ambulation;Regular Toilet;Rolling walker (2 wheels);Grab bars   Toileting- Clothing Manipulation and Hygiene: Maximal assistance;Sit to/from stand Toileting - Clothing Manipulation Details (indicate cue type and reason): assistance cleaning after BM     Functional mobility during ADLs: Min guard General ADL Comments: pt.moving well throughout session. no lob or sob noted.  full b/d adl with toileting in b.room    Extremity/Trunk Assessment              Vision       Perception     Praxis      Cognition Arousal/Alertness: Awake/alert Behavior During Therapy: WFL for tasks assessed/performed Overall Cognitive Status: Within Functional  Limits for tasks assessed                                 General Comments: difficulty expressing her needs and what she wants.  makes faces of disapointment or frustration leaning to that she is not happy about something but will not express what it is without encouragement to vocalize her needs/wants.  reviewed we need to know so we can help her and do our job better. she was receptive and appreciative to this        Exercises      Shoulder Instructions       General Comments  Upon entry to room pt. Expressed feeling upset/ frustrated that she wanted a bath and linen change.  Feeling like "non one ever comes to help her".  I provided emotional support and told her together we would get all of these things done for her.  Reviewed it was also a part of her ot goals so she would be helping with the adls.  Pt. Able to start bathing while I changed the bed for her.  Pt. Also requesting bed be wiped down "with the purple top".  Cleaned bed per pt. Request before remaking it.  Assisted with portions of bath she could not.  Encouraged her to express her needs and wishes and we would help her.  Room organized to pts. Satisfaction.  Pt. Was comfortable and happy by the end of session. Smiling and laughing.  Updated rn on pts. Request for meds including pain meds.  Reviewed with pt. He states he is on his way to the room.  All needs met. Pt. Expressing gratitude and wanting me to have a voucher for the cafeteria.  She had a stack of them on her tray table signed with her signature and intials.  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  Politely declined multiple times and she said "take it".  I thanked her and took it per her request.      Pertinent Vitals/ Pain       Pain Assessment Pain Assessment: Faces Faces Pain Scale: Hurts little more Pain Location: L flank and back Pain Descriptors / Indicators: Aching, Discomfort Pain Intervention(s):  Patient requesting pain meds-RN notified, Limited activity within patient's tolerance, Repositioned  Home Living                                          Prior Functioning/Environment              Frequency  Min 2X/week        Progress Toward Goals  OT Goals(current goals can now be found in the care plan section)  Progress towards OT goals: Progressing toward goals     Plan Discharge plan remains appropriate;Frequency remains appropriate    Co-evaluation                 AM-PAC OT "6  Clicks" Daily Activity     Outcome Measure   Help from another person eating meals?: A Little Help from another person taking care of personal grooming?: A Little Help from another person toileting, which includes using toliet, bedpan, or urinal?: A Little Help from another person bathing (including washing, rinsing, drying)?: A Little Help from another person to put on and taking off regular upper body clothing?: A Little Help from another person to put on and taking off regular lower body clothing?: A Little 6 Click Score: 18    End of Session Equipment Utilized During Treatment: Oxygen  OT Visit Diagnosis: Unsteadiness on feet (R26.81);Muscle weakness (generalized) (M62.81);Pain Pain - Right/Left: Left   Activity Tolerance Patient tolerated treatment well   Patient Left in bed;with call bell/phone within reach   Nurse Communication Other (comment) (alerted rn pt. ready for morning meds including pain meds)        Time: [id number] OT Time Calculation (min): 73 min  Charges: OT General Charges $OT Visit: 1 Visit OT Treatments $Self Care/Home Management : 68-82 mins  Sonia Baller, COTA/L Acute Rehabilitation [phone]   Clearnce Sorrel Lorraine-COTA/L 10/23/2022, 11:43 AM

## 2022-10-24 ENCOUNTER — Inpatient Hospital Stay (HOSPITAL_COMMUNITY): Payer: Medicaid Other

## 2022-10-24 DIAGNOSIS — J90 Pleural effusion, not elsewhere classified: Secondary | ICD-10-CM | POA: Diagnosis not present

## 2022-10-24 DIAGNOSIS — R072 Precordial pain: Secondary | ICD-10-CM | POA: Diagnosis not present

## 2022-10-24 DIAGNOSIS — R6510 Systemic inflammatory response syndrome (SIRS) of non-infectious origin without acute organ dysfunction: Secondary | ICD-10-CM | POA: Diagnosis not present

## 2022-10-24 DIAGNOSIS — D509 Iron deficiency anemia, unspecified: Secondary | ICD-10-CM | POA: Diagnosis not present

## 2022-10-24 LAB — CBC WITH DIFFERENTIAL/PLATELET
Abs Immature Granulocytes: 0.07 10*3/uL (ref 0.00–0.07)
Basophils Absolute: 0.1 10*3/uL (ref 0.0–0.1)
Basophils Relative: 1 %
Eosinophils Absolute: 0.5 10*3/uL (ref 0.0–0.5)
Eosinophils Relative: 5 %
HCT: 25.6 % — ABNORMAL LOW (ref 36.0–46.0)
Hemoglobin: 7.8 g/dL — ABNORMAL LOW (ref 12.0–15.0)
Immature Granulocytes: 1 %
Lymphocytes Relative: 19 %
Lymphs Abs: 1.8 10*3/uL (ref 0.7–4.0)
MCH: 18.4 pg — ABNORMAL LOW (ref 26.0–34.0)
MCHC: 30.5 g/dL (ref 30.0–36.0)
MCV: 60.2 fL — ABNORMAL LOW (ref 80.0–100.0)
Monocytes Absolute: 0.4 10*3/uL (ref 0.1–1.0)
Monocytes Relative: 5 %
Neutro Abs: 6.5 10*3/uL (ref 1.7–7.7)
Neutrophils Relative %: 69 %
Platelets: 326 10*3/uL (ref 150–400)
RBC: 4.25 MIL/uL (ref 3.87–5.11)
RDW: 23.3 % — ABNORMAL HIGH (ref 11.5–15.5)
WBC: 9.2 10*3/uL (ref 4.0–10.5)
nRBC: 0.8 % — ABNORMAL HIGH (ref 0.0–0.2)

## 2022-10-24 LAB — ACID FAST SMEAR (AFB, MYCOBACTERIA)
Acid Fast Smear: NEGATIVE
Acid Fast Smear: NEGATIVE
Acid Fast Smear: NEGATIVE

## 2022-10-24 LAB — COMPREHENSIVE METABOLIC PANEL
ALT: 22 U/L (ref 0–44)
AST: 18 U/L (ref 15–41)
Albumin: 2.1 g/dL — ABNORMAL LOW (ref 3.5–5.0)
Alkaline Phosphatase: 72 U/L (ref 38–126)
Anion gap: 8 (ref 5–15)
BUN: 9 mg/dL (ref 6–20)
CO2: 25 mmol/L (ref 22–32)
Calcium: 8.3 mg/dL — ABNORMAL LOW (ref 8.9–10.3)
Chloride: 104 mmol/L (ref 98–111)
Creatinine, Ser: 0.59 mg/dL (ref 0.44–1.00)
GFR, Estimated: >60 mL/min (ref >60)
Glucose, Bld: 128 mg/dL — ABNORMAL HIGH (ref 70–99)
Potassium: 3.6 mmol/L (ref 3.5–5.1)
Sodium: 137 mmol/L (ref 135–145)
Total Bilirubin: 0.5 mg/dL (ref 0.3–1.2)
Total Protein: 5.9 g/dL — ABNORMAL LOW (ref 6.5–8.1)

## 2022-10-24 LAB — TYPE AND SCREEN
ABO/RH(D): A POS
Antibody Screen: POSITIVE

## 2022-10-24 MED ORDER — POTASSIUM CHLORIDE CRYS ER 20 MEQ PO TBCR
40.0000 meq | EXTENDED_RELEASE_TABLET | Freq: Once | ORAL | Status: AC
  Administered 2022-10-24: 40 meq via ORAL
  Filled 2022-10-24: qty 2

## 2022-10-24 MED ORDER — MELATONIN 5 MG PO TABS
5.0000 mg | ORAL_TABLET | Freq: Every day | ORAL | Status: DC
  Administered 2022-10-24 – 2022-10-27 (×4): 5 mg via ORAL
  Filled 2022-10-24 (×4): qty 1

## 2022-10-24 NOTE — Progress Notes (Signed)
PT Cancellation Note  Patient Details Name: Julie King MRN: [id number] DOB: 1991/07/08   Cancelled Treatment:    Reason Eval/Treat Not Completed: (P) Other (comment) (pt on bed rest 1 hour s/p anterior chest tube removal.) Will continue efforts per PT plan of care as schedule permits later in the day.    Eirik Schueler M Kealani Leckey 10/24/2022, 10:02 AM

## 2022-10-24 NOTE — Progress Notes (Signed)
[facility]                                                                              Julie King, is a 32 y.o. 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  Pathology negative for malignancy and showed inflammation with focal necrosis, fibrosis and pneumocyte hyperplasia.  Discharge home after surgery.     Presented back to ED with 3 days of worsening left-sided chest pain, subjective fever, cough, nausea and orthopnea. CTA with postsurgical changes small left pleural effusion. Due to persistent leukocytosis and febrile, chest x-ray was repeated on 2/19 which showed increased effusion on the left side. IR guided thoracentesis 500 mL removed, no growth so far.  Underwent VATS on 2/23   Assessment & Plan    Principal Problem: Sepsis present on admission, left lower lobe pneumonia with complicated parapneumonic effusion with pleurisy Postop pleural space infection -Completed Zithromax for 3 days, continue IV vancomycin and cefepime -Blood cultures NTD, sputum cultures 2/17 showed respiratory flora.  Surgical cultures from 2/8 with Streptococcus. -Fluid culture from thoracentesis 2/19 showed no growth. -CTA negative for PE, Doppler ultrasound negative for DVT -CT surgery following, underwent VATS and drainage of left pleural effusion on 2/23, cultures negative so far -Chest tube in place, pain better controlled, did not sleep well last night. -O2 sats 100% on 3 L, wean O2 as tolerated Active problems   Acute on chronic microcytic anemia, iron deficiency  -  Baseline hemoglobin about 9-10.  Iron 16, ferritin 104, percent saturation ratio 8 -Hemoglobin 7.8, continue to follow, transfuse if less than 7.5  Leukocytosis -Current broad-spectrum antibiotics -Procalcitonin less than 0.1, WBCs improving    Morbid obesity Estimated body mass index is 50.12 kg/m as calculated from the following:   Height as of this encounter: '5\' 7"'$  (1.702 m).   Weight as of this encounter: 145.2 kg.  Code Status: Full code DVT Prophylaxis:  enoxaparin (LOVENOX) injection 40 mg Start: 10/22/22 1400 SCD's Start: 10/21/22 1721   Level of Care: Level of care: Telemetry Medical Family Communication: Updated patient Disposition Plan:      Remains inpatient appropriate: Once chest tubes are removed, cleared by CT surgery   Procedures:   VIDEO ASSISTED THORACOSCOPY (Left) DRAINAGE OF PLEURAL EFFUSION (Left) DECORTICATION (Left)  Consultants:   Cardiothoracic surgery  Antimicrobials:   Anti-infectives (From admission, onward)    Start     Dose/Rate King Frequency Ordered Stop   10/18/22 0800  vancomycin (VANCOREADY) IVPB 1500 mg/300 mL        1,500 mg 150 mL/hr over 120 Minutes Intravenous Every 8 hours 10/18/22 0147     10/16/22 1230  fluconazole (DIFLUCAN) tablet 200  mg        200 mg Oral  Once 10/16/22 1135 10/16/22 1235   10/16/22 1000  azithromycin (ZITHROMAX) tablet 500 mg        500 mg Oral Daily 10/15/22 1233 10/16/22 0919   10/16/22 0130  vancomycin (VANCOREADY) IVPB 1500 mg/300 mL  Status:  Discontinued        1,500 mg 150 mL/hr over 120 Minutes Intravenous Every 12 hours 10/15/22 1245 10/18/22 0147   10/15/22 1330  vancomycin (VANCOREADY) IVPB 2000 mg/400 mL        2,000 mg 200 mL/hr over 120 Minutes Intravenous  Once 10/15/22 1233 10/15/22 1609   10/15/22 1330  ceFEPIme (MAXIPIME) 2 g in sodium chloride 0.9 % 100 mL IVPB        2 g 200 mL/hr over 30 Minutes Intravenous Every 8 hours 10/15/22 1233     10/15/22 1000  azithromycin (ZITHROMAX)  500 mg in sodium chloride 0.9 % 250 mL IVPB  Status:  Discontinued        500 mg 250 mL/hr over 60 Minutes Intravenous Every 24 hours 10/14/22 1110 10/15/22 1208   10/15/22 0600  cefTRIAXone (ROCEPHIN) 2 g in sodium chloride 0.9 % 100 mL IVPB  Status:  Discontinued        2 g 200 mL/hr over 30 Minutes Intravenous Every 24 hours 10/14/22 1110 10/15/22 1208   10/14/22 0645  azithromycin (ZITHROMAX) 500 mg in sodium chloride 0.9 % 250 mL IVPB        500 mg 250 mL/hr over 60 Minutes Intravenous  Once 10/14/22 0633 10/14/22 0900   10/14/22 0515  cefTRIAXone (ROCEPHIN) 2 g in sodium chloride 0.9 % 100 mL IVPB        2 g 200 mL/hr over 30 Minutes Intravenous  Once 10/14/22 0506 10/14/22 0622          Medications  acetaminophen  1,000 mg Oral Q6H   Or   acetaminophen (TYLENOL) oral liquid 160 mg/5 mL  1,000 mg Oral Q6H   bisacodyl  10 mg Oral Daily   Chlorhexidine Gluconate Cloth  6 each Topical Daily   enoxaparin (LOVENOX) injection  40 mg Subcutaneous Daily   gabapentin  300 mg Oral TID   guaiFENesin  600 mg Oral BID   ketorolac  30 mg Intravenous Q6H   melatonin  5 mg Oral QHS   miconazole nitrate   Topical BID   pantoprazole  40 mg Oral Daily   polyethylene glycol  17 g Oral Daily   senna-docusate  1 tablet Oral QHS      Subjective:   Julie King was seen and examined today.  Did not sleep well last night, pain better.  No acute shortness of breath, nausea vomiting, abdominal pain.  No fevers  Objective:   Vitals:   10/23/22 2317 10/24/22 0332 10/24/22 1000 10/24/22 1040  BP: 133/85 129/78  (!) 137/99  Pulse: 83 94 73 69  Resp: '18 17 18 17  '$ Temp: 98.5 F (36.9 C) 99 F (37.2 C)  98.7 F (37.1 C)  TempSrc: Oral Oral  Oral  SpO2: 100% 100% 100% 100%  Weight:      Height:        Intake/Output Summary (Last 24 hours) at 10/24/2022 1411 Last data filed at 10/24/2022 1337 Gross per 24 hour  Intake 1299.86 ml  Output 2610 ml  Net -1310.14 ml     Wt Readings  from Last 3 Encounters:  10/21/22 (!) 145.2 kg  10/13/22 (!) 145.2 kg  10/04/22 (!) 145.5 kg   Physical Exam General: Alert and oriented x 3, NAD Cardiovascular: S1 S2 clear, RRR.  Respiratory: Diminished left base, CTA right lung, chest tube+ Gastrointestinal: Soft, nontender, nondistended, NBS Ext: no pedal edema bilaterally Psych: Normal affect     Data Reviewed:  I have personally reviewed following labs    CBC Lab Results  Component Value Date   WBC 9.2 10/24/2022   RBC 4.25 10/24/2022   HGB 7.8 (L) 10/24/2022   HCT 25.6 (L) 10/24/2022   MCV 60.2 (L) 10/24/2022   MCH 18.4 (L) 10/24/2022   PLT 326 10/24/2022   MCHC 30.5 10/24/2022   RDW 23.3 (H) 10/24/2022   LYMPHSABS 1.8 10/24/2022   MONOABS 0.4 10/24/2022   EOSABS 0.5 10/24/2022   BASOSABS 0.1 0000000     Last metabolic panel Lab Results  Component Value Date   NA 137 10/24/2022   K 3.6 10/24/2022   CL 104 10/24/2022   CO2 25 10/24/2022   BUN 9 10/24/2022   CREATININE 0.59 10/24/2022   GLUCOSE 128 (H) 10/24/2022   GFRNONAA >60 10/24/2022   GFRAA >60 02/04/2020   CALCIUM 8.3 (L) 10/24/2022   PHOS 3.2 10/20/2022   PROT 5.9 (L) 10/24/2022   ALBUMIN 2.1 (L) 10/24/2022   LABGLOB 3.2 01/12/2022   AGRATIO 1.3 01/12/2022   BILITOT 0.5 10/24/2022   ALKPHOS 72 10/24/2022   AST 18 10/24/2022   ALT 22 10/24/2022   ANIONGAP 8 10/24/2022    CBG (last 3)  Recent Labs    10/22/22 1136 10/22/22 1612  GLUCAP 133* 203*      Coagulation Profile: No results for input(s): "INR", "PROTIME" in the last 168 hours.   Radiology Studies: I have personally reviewed the imaging studies  DG CHEST PORT 1 VIEW  Result Date: 10/24/2022 CLINICAL DATA:  Left chest tube EXAM: PORTABLE CHEST 1 VIEW COMPARISON:  10/23/2022 FINDINGS: Right internal jugular central line is unchanged with its tip in the SVC at the azygos level. Two left chest tubes remain in place. No pleural air. Abnormal density persists in the left  chest consistent with a combination of residual pleural fluid and pulmonary atelectasis or infiltrate. Mild volume loss seen at the right lung base. IMPRESSION: No significant change. No pleural air. Persistent abnormal density in the left chest consistent with a combination of pleural fluid and pulmonary atelectasis or infiltrate. Electronically Signed   By: Nelson Chimes M.D.   On: 10/24/2022 08:03   DG CHEST PORT 1 VIEW  Result Date: 10/23/2022 CLINICAL DATA:  Pleural effusion EXAM: PORTABLE CHEST 1 VIEW COMPARISON:  10/22/2022 FINDINGS: Left-sided chest tubes are stable in positioning. Right IJ central venous catheter terminates at the level of the distal SVC. Stable heart size. Low lung volumes with elevation of the left hemidiaphragm. Persistent small left pleural effusion. Left greater than right bibasilar opacities. No pneumothorax. IMPRESSION: Persistent small left pleural effusion with left greater than right bibasilar opacities, not significantly changed from prior. No pneumothorax. Electronically Signed   By: Davina Poke D.O.   On: 10/23/2022 10:57       Julie King M.D. [facility] 10/24/2022, 2:11 PM  Available via Epic secure chat 7am-7pm After 7 pm, please refer to night coverage provider listed on amion.

## 2022-10-24 NOTE — Progress Notes (Addendum)
      [address]       [place] [address]             [phone]       3 Days Post-Op Procedure(s) (LRB): VIDEO ASSISTED THORACOSCOPY (Left) DRAINAGE OF PLEURAL EFFUSION (Left) DECORTICATION (Left)  Subjective: Patient in tears this am because of pain.  Objective: Vital signs in last 24 hours: Temp:  [98.5 F (36.9 C)-99.1 F (37.3 C)] 99 F (37.2 C) (02/26 0332) Pulse Rate:  [78-94] 94 (02/26 0332) Cardiac Rhythm: Normal sinus rhythm (02/25 2100) Resp:  [17-22] 17 (02/26 0332) BP: (129-150)/(66-86) 129/78 (02/26 0332) SpO2:  [90 %-100 %] 100 % (02/26 0332)    Intake/Output from previous day: 02/25 0701 - 02/26 0700 In: 656.7 [P.O.:480; I.V.:176.7] Out: 1960 [Urine:1900; Chest Tube:60]   Physical Exam: Cardiovascular: RRR Pulmonary: Clear to auscultation on right and diminished left base Abdomen: Soft, non tender, bowel sounds present. Extremities: SCDs in place Wounds: Clean and dry.  No erythema or signs of infection. Dressing around chest tubes is clean and dry Chest Tubes: to water seal, no air leak    Lab Results: CBC: Recent Labs    10/23/22 0435 10/24/22 0517  WBC 10.6* 9.2  HGB 7.5* 7.8*  HCT 25.1* 25.6*  PLT 322 326    BMET:  Recent Labs    10/23/22 0435 10/24/22 0517  NA 139 137  K 4.1 3.6  CL 106 104  CO2 25 25  GLUCOSE 87 128*  BUN <5* 9  CREATININE 0.57 0.59  CALCIUM 8.4* 8.3*     PT/INR: No results for input(s): "LABPROT", "INR" in the last 72 hours. ABG:  INR: Will add last result for INR, ABG once components are confirmed Will add last 4 CBG results once components are confirmed  Assessment/Plan: Assessment/Plan:   1. CV - SR. 2.  Pulmonary - On 3 liters of oxygen via [place]. Wean as able. Chest tubes with 60 cc of output last242 hours. Chest tubes are to water seal, no air leak. CXR this am appears to show left base atelectasis/small effusion (slowly improving). As discussed with Dr. Kipp Brood, will  remove anterior tube today.Gram stain showed no organisms and culture shows no growth 2 days. Encourage incentive spirometer and flutter valve. 3. ID-on Vancomycin and Cefepime. WBC this am decreased to 10,600  4. Anemia-H and H this am stable at 7.8 and 25.6 5. On Lovenox for DVT prophylaxis 6. Chest tube dressing changes as ordered 7. Supplement potassium 8. Regarding pain control, on Neurontin 300 tid, Toradol 30 IV Q 6, Oxy PRN, Dilaudid IV PRN. Hopefully, will lessen once chest tube removed.  Gerado Nabers M ZimmermanPA-C 10/24/2022,7:22 AM

## 2022-10-24 NOTE — Progress Notes (Signed)
Mobility Specialist Progress Note  Patient declined OOB activity despite encouragement. Stated the she just received pain medication and was drowsy. Also, just got back in bed with nursing staff. Asked to come back at a later time. Will follow up as time permits.   Julie King, BS EXP Mobility Specialist Please contact via SecureChat or Rehab office at [phone]

## 2022-10-25 ENCOUNTER — Ambulatory Visit: Payer: Medicaid Other | Admitting: Thoracic Surgery (Cardiothoracic Vascular Surgery)

## 2022-10-25 ENCOUNTER — Inpatient Hospital Stay (HOSPITAL_COMMUNITY): Payer: Medicaid Other

## 2022-10-25 DIAGNOSIS — J90 Pleural effusion, not elsewhere classified: Secondary | ICD-10-CM | POA: Diagnosis not present

## 2022-10-25 DIAGNOSIS — R072 Precordial pain: Secondary | ICD-10-CM | POA: Diagnosis not present

## 2022-10-25 DIAGNOSIS — R6510 Systemic inflammatory response syndrome (SIRS) of non-infectious origin without acute organ dysfunction: Secondary | ICD-10-CM | POA: Diagnosis not present

## 2022-10-25 LAB — TYPE AND SCREEN
ABO/RH(D): A POS
Antibody Screen: POSITIVE
DAT, IgG: NEGATIVE
Unit division: 0
Unit division: 0
Unit division: 0
Unit division: 0

## 2022-10-25 LAB — CBC WITH DIFFERENTIAL/PLATELET
Abs Immature Granulocytes: 0 10*3/uL (ref 0.00–0.07)
Basophils Absolute: 0.1 10*3/uL (ref 0.0–0.1)
Basophils Relative: 1 %
Eosinophils Absolute: 0.5 10*3/uL (ref 0.0–0.5)
Eosinophils Relative: 5 %
HCT: 26.8 % — ABNORMAL LOW (ref 36.0–46.0)
Hemoglobin: 7.9 g/dL — ABNORMAL LOW (ref 12.0–15.0)
Lymphocytes Relative: 20 %
Lymphs Abs: 2 10*3/uL (ref 0.7–4.0)
MCH: 18 pg — ABNORMAL LOW (ref 26.0–34.0)
MCHC: 29.5 g/dL — ABNORMAL LOW (ref 30.0–36.0)
MCV: 61 fL — ABNORMAL LOW (ref 80.0–100.0)
Monocytes Absolute: 0.5 10*3/uL (ref 0.1–1.0)
Monocytes Relative: 5 %
Neutro Abs: 6.8 10*3/uL (ref 1.7–7.7)
Neutrophils Relative %: 69 %
Platelets: 362 10*3/uL (ref 150–400)
RBC: 4.39 MIL/uL (ref 3.87–5.11)
RDW: 23.3 % — ABNORMAL HIGH (ref 11.5–15.5)
WBC: 9.9 10*3/uL (ref 4.0–10.5)
nRBC: 0.5 % — ABNORMAL HIGH (ref 0.0–0.2)
nRBC: 1 /100 WBC — ABNORMAL HIGH

## 2022-10-25 LAB — BPAM RBC
Blood Product Expiration Date: 202403022359
Blood Product Expiration Date: 202403162359
Blood Product Expiration Date: 202403162359
Blood Product Expiration Date: 202403172359
ISSUE DATE / TIME: 202402231059
ISSUE DATE / TIME: 202402231219
Unit Type and Rh: 6200
Unit Type and Rh: 6200
Unit Type and Rh: 6200
Unit Type and Rh: 6200

## 2022-10-25 LAB — SURGICAL PATHOLOGY

## 2022-10-25 LAB — BASIC METABOLIC PANEL
Anion gap: 5 (ref 5–15)
BUN: 8 mg/dL (ref 6–20)
CO2: 28 mmol/L (ref 22–32)
Calcium: 8.4 mg/dL — ABNORMAL LOW (ref 8.9–10.3)
Chloride: 105 mmol/L (ref 98–111)
Creatinine, Ser: 0.5 mg/dL (ref 0.44–1.00)
GFR, Estimated: >60 mL/min (ref >60)
Glucose, Bld: 88 mg/dL (ref 70–99)
Potassium: 3.9 mmol/L (ref 3.5–5.1)
Sodium: 138 mmol/L (ref 135–145)

## 2022-10-25 LAB — VANCOMYCIN, TROUGH: Vancomycin Tr: 10 ug/mL — ABNORMAL LOW (ref 15–20)

## 2022-10-25 LAB — VANCOMYCIN, PEAK: Vancomycin Pk: 21 ug/mL — ABNORMAL LOW (ref 30–40)

## 2022-10-25 NOTE — Progress Notes (Signed)
PT Cancellation Note  Patient Details Name: GILDA UDELL MRN: [id number] DOB: 08-07-1991   Cancelled Treatment:    Reason Eval/Treat Not Completed: (P) Patient declined, no reason specified (pt states she is in a meeting on her laptop for work (pt works here at [employer] prior to admission).) Will continue efforts per PT plan of care as schedule permits later in day.   Amron Guerrette M Kristi Norment 10/25/2022, 1:20 PM

## 2022-10-25 NOTE — Progress Notes (Addendum)
Occupational Therapy Treatment Patient Details Name: Julie King MRN: [id number] DOB: 1990-12-30 Today's Date: 10/25/2022   History of present illness 32 year old with recently diagnosed lingular mass, robotic biopsy 06/2022 negative for malignancy, repeat CT scans with increasing size and hemoptysis. 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   Admitted due to L side chest pain, fever, cough and orthopnea found to have L pleural effusion.  Underwent VATS and L chest tube placement 2/23.   OT comments  Pt progressing well towards goals this session, seen post chest tube removal. Pt needing set up A for bathing at sink, min A for UB dressing, and min guard overall for ambulation in room and hallway with x1 seated rest break. SpO2 in mid 90's throughout session on 3L O2. Pt with LUE pain, however able to put her hair up sitting in front of mirror with BUE. Pt stating purewick has leaked in the past, encouraged pt to get up to [facility] vs purewick and BSC placed next to bed for toileting. Pt presenting with impairments listed below, will follow acutely. Continue to recommend HHOT at d/c.   Recommendations for follow up therapy are one component of a multi-disciplinary discharge planning process, led by the attending physician.  Recommendations may be updated based on patient status, additional functional criteria and insurance authorization.    Follow Up Recommendations  Home health OT     Assistance Recommended at Discharge Intermittent Supervision/Assistance  Patient can return home with the following  Direct supervision/assist for medications management;Direct supervision/assist for financial management;Assist for transportation;Help with stairs or ramp for entrance;Assistance with cooking/housework;A little help with walking and/or transfers;A little help with bathing/dressing/bathroom   Equipment Recommendations  BSC/3in1    Recommendations for Other Services       Precautions / Restrictions Precautions Precautions: Other (comment) Precaution Comments: watch vitals Restrictions Weight Bearing Restrictions: No       Mobility Bed Mobility               General bed mobility comments: bathing at sink upon arrival, left seated EOB    Transfers Overall transfer level: Needs assistance Equipment used: Rolling walker (2 wheels) Transfers: Sit to/from Stand, Bed to chair/wheelchair/BSC Sit to Stand: Supervision                 Balance Overall balance assessment: Needs assistance Sitting-balance support: Feet supported, No upper extremity supported Sitting balance-Leahy Scale: Good     Standing balance support: Bilateral upper extremity supported, During functional activity Standing balance-Leahy Scale: Fair Standing balance comment: static stands and can walk short distances without RW                           ADL either performed or assessed with clinical judgement   ADL Overall ADL's : Needs assistance/impaired     Grooming:  (putting hair up in bun) Grooming Details (indicate cue type and reason): pt reporting painful LUE Upper Body Bathing: Set up;Sitting   Lower Body Bathing: Set up;Sitting/lateral leans   Upper Body Dressing : Minimal assistance;Sitting Upper Body Dressing Details (indicate cue type and reason): assist due to tele lines     Toilet Transfer: Min guard;Ambulation;Regular Toilet;Rolling walker (2 wheels);Grab bars Toilet Transfer Details (indicate cue type and reason): simulated via funcitonal mobility;encouraged use of BSC vs purewick as pt reports it has leaked in the past         Functional mobility during ADLs: Min  guard General ADL Comments: pt  performing IADL task cleaning items up around room with x1 seated rest break    Extremity/Trunk Assessment Upper Extremity Assessment Upper Extremity Assessment: Overall WFL for tasks assessed   Lower Extremity Assessment Lower  Extremity Assessment: Defer to PT evaluation        Vision   Vision Assessment?: No apparent visual deficits   Perception Perception Perception: Not tested   Praxis Praxis Praxis: Not tested    Cognition Arousal/Alertness: Awake/alert Behavior During Therapy: WFL for tasks assessed/performed Overall Cognitive Status: Within Functional Limits for tasks assessed                         Following Commands: Follows one step commands consistently   Awareness: Emergent            Exercises      Shoulder Instructions       General Comments SpO2 in mid 90's on 3L during session    Pertinent Vitals/ Pain       Pain Assessment Pain Assessment: Faces Pain Score: 4  Faces Pain Scale: Hurts little more Pain Location: L flank and back Pain Descriptors / Indicators: Aching, Discomfort Pain Intervention(s): Monitored during session, Limited activity within patient's tolerance, Repositioned, Patient requesting pain meds-RN notified  Home Living                                          Prior Functioning/Environment              Frequency  Min 2X/week        Progress Toward Goals  OT Goals(current goals can now be found in the care plan section)  Progress towards OT goals: Progressing toward goals  Acute Rehab OT Goals Patient Stated Goal: none stated OT Goal Formulation: With patient Time For Goal Achievement: 10/30/22 Potential to Achieve Goals: Good ADL Goals Pt Will Perform Lower Body Bathing: with min guard assist;sit to/from stand;sitting/lateral leans;with adaptive equipment Pt Will Perform Lower Body Dressing: with min guard assist;with adaptive equipment;sitting/lateral leans;sit to/from stand Pt Will Transfer to Toilet: with min guard assist;ambulating;grab bars;regular height toilet Pt Will Perform Toileting - Clothing Manipulation and hygiene: with min guard assist;with adaptive equipment;sitting/lateral leans;sit  to/from stand Additional ADL Goal #1: Patient will be able to come up with 3 strategies to manage anxiety in preparation for movement or when she is feeling short of breath.  Plan Discharge plan remains appropriate;Frequency remains appropriate    Co-evaluation                 AM-PAC OT "6 Clicks" Daily Activity     Outcome Measure   Help from another person eating meals?: None Help from another person taking care of personal grooming?: None Help from another person toileting, which includes using toliet, bedpan, or urinal?: A Little Help from another person bathing (including washing, rinsing, drying)?: A Little Help from another person to put on and taking off regular upper body clothing?: A Little Help from another person to put on and taking off regular lower body clothing?: A Little 6 Click Score: 20    End of Session Equipment Utilized During Treatment: Oxygen (3L)  OT Visit Diagnosis: Unsteadiness on feet (R26.81);Muscle weakness (generalized) (M62.81);Pain Pain - Right/Left: Left   Activity Tolerance Patient tolerated treatment well   Patient Left in bed;with call bell/phone within  reach   Nurse Communication Patient requests pain meds;Mobility status        Time: 1500-1536 OT Time Calculation (min): 36 min  Charges: OT General Charges $OT Visit: 1 Visit OT Treatments $Self Care/Home Management : 8-22 mins $Therapeutic Activity: 8-22 mins  Renaye Rakers, OTD, OTR/L SecureChat Preferred Acute Rehab [phone]   Ulla Gallo 10/25/2022, 4:07 PM

## 2022-10-25 NOTE — Progress Notes (Signed)
Pharmacy Antibiotic Note  Julie King is a 32 y.o. female admitted on 10/14/2022 with pneumonia.  Pharmacy has been consulted for vancomycin and cefepime dosing.  Vanc peak 16, trough 6 >> AUC 250, below goal.  Last Vancomycin levels 2/21 and now 2/27 along with renal function has remained stable since that time. Patient is currently afebrile. Oxygen requirement has remained stable.   Confirmed with RN, vanc trough was drawn before next vancomycin bag was started. Vanc peak = 21, vanc trough 10 >> AUC ~410. Therapeutic so we will continue with the current dose.  Plan: Cont vancomycin '1500mg'$  IV Q8H Continue cefepime 2g IV Q8H  Height: '5\' 7"'$  (170.2 cm) Weight: (!) 145.2 kg (320 lb) IBW/kg (Calculated) : 61.6  Temp (24hrs), Avg:98.7 F (37.1 C), Min:98.5 F (36.9 C), Max:98.9 F (37.2 C)  Recent Labs  Lab 10/19/22 1205 10/19/22 1459 10/20/22 0716 10/21/22 0629 10/22/22 0436 10/23/22 0435 10/24/22 0517 10/25/22 0600 10/25/22 0613 10/25/22 1500 10/25/22 2028  WBC  --   --  11.3* 13.0* 17.1* 10.6* 9.2 9.9  --   --   --   CREATININE  --   --  0.64  --  0.71 0.57 0.59  --  0.50  --   --   VANCOTROUGH  --  11*  --   --   --   --   --   --   --   --  10*  VANCOPEAK 18*  --   --   --   --   --   --   --   --  21*  --     Estimated Creatinine Clearance: 152.8 mL/min (by C-G formula based on SCr of 0.5 mg/dL).    Allergies  Allergen Reactions   Percocet [Oxycodone-Acetaminophen] Hives, Itching and Nausea Only   Pineapple Itching    Fresh pineapples; pineapples from a can is okay    2/16 azithromycin >> (2/18) 2/16 CTX >> 2/17 2/17 vancomycin >> 2/17 cefepime >>  Thank you for allowing pharmacy to be a part of this patient's care.  Ardyth Harps, PharmD Clinical Pharmacist

## 2022-10-25 NOTE — Progress Notes (Signed)
[facility]                                                                              Julie King, is a 32 y.o. 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  Pathology negative for malignancy and showed inflammation with focal necrosis, fibrosis and pneumocyte hyperplasia.  Discharge home after surgery.     Presented back to ED with 3 days of worsening left-sided chest pain, subjective fever, cough, nausea and orthopnea. CTA with postsurgical changes small left pleural effusion. Due to persistent leukocytosis and febrile, chest x-ray was repeated on 2/19 which showed increased effusion on the left side. IR guided thoracentesis 500 mL removed, no growth so far.  Underwent VATS on 2/23   Assessment & Plan    Principal Problem: Sepsis present on admission, left lower lobe pneumonia with complicated parapneumonic effusion with pleurisy Postop pleural space infection -Blood cultures NTD, sputum cultures 2/17 showed respiratory flora.  Surgical cultures from 2/8 with Streptococcus. -Fluid culture from thoracentesis 2/19 showed no growth. -CTA negative for PE, Doppler ultrasound negative for DVT - underwent VATS and drainage of left pleural effusion on 2/23, cultures NTD -Appreciate CT surgery recommendation, plan for DC chest tube today -Recommended at least 2 weeks of IV antibiotics, on vancomycin and cefepime day 12, and 6 weeks total. -Pain controlled   Active problems  Acute on chronic microcytic anemia, iron deficiency  - Baseline hemoglobin about 9-10.   Iron 16, ferritin 104, percent saturation ratio 8 -H&H improving, 7.9, transfuse for hemoglobin less than 7.5  Leukocytosis -Current broad-spectrum antibiotics -Procalcitonin less than 0.1 -Resolved    Morbid obesity Estimated body mass index is 50.12 kg/m as calculated from the following:   Height as of this encounter: '5\' 7"'$  (1.702 m).   Weight as of this encounter: 145.2 kg.  Code Status: Full code DVT Prophylaxis:  enoxaparin (LOVENOX) injection 40 mg Start: 10/22/22 1400 SCD's Start: 10/21/22 1721   Level of Care: Level of care: Telemetry Medical Family Communication: Updated patient Disposition Plan:      Remains inpatient appropriate: Once chest tubes are removed, cleared by CT surgery, need 2 more days of IV antibiotics and 6 weeks total   Procedures:   VIDEO ASSISTED THORACOSCOPY (Left) DRAINAGE OF PLEURAL EFFUSION (Left) DECORTICATION (Left)  Consultants:   Cardiothoracic surgery  Antimicrobials:   Anti-infectives (From admission, onward)    Start     Dose/Rate Route Frequency Ordered Stop   10/18/22 0800  vancomycin (VANCOREADY) IVPB 1500 mg/300 mL        1,500 mg 150 mL/hr over 120 Minutes Intravenous Every 8 hours 10/18/22 0147     10/16/22 1230  fluconazole (  DIFLUCAN) tablet 200 mg        200 mg Oral  Once 10/16/22 1135 10/16/22 1235   10/16/22 1000  azithromycin (ZITHROMAX) tablet 500 mg        500 mg Oral Daily 10/15/22 1233 10/16/22 0919   10/16/22 0130  vancomycin (VANCOREADY) IVPB 1500 mg/300 mL  Status:  Discontinued        1,500 mg 150 mL/hr over 120 Minutes Intravenous Every 12 hours 10/15/22 1245 10/18/22 0147   10/15/22 1330  vancomycin (VANCOREADY) IVPB 2000 mg/400 mL        2,000 mg 200 mL/hr over 120 Minutes Intravenous  Once 10/15/22 1233 10/15/22 1609   10/15/22 1330  ceFEPIme (MAXIPIME) 2 g in sodium chloride 0.9 % 100 mL IVPB        2 g 200 mL/hr over 30 Minutes Intravenous Every 8 hours 10/15/22 1233     10/15/22 1000  azithromycin  (ZITHROMAX) 500 mg in sodium chloride 0.9 % 250 mL IVPB  Status:  Discontinued        500 mg 250 mL/hr over 60 Minutes Intravenous Every 24 hours 10/14/22 1110 10/15/22 1208   10/15/22 0600  cefTRIAXone (ROCEPHIN) 2 g in sodium chloride 0.9 % 100 mL IVPB  Status:  Discontinued        2 g 200 mL/hr over 30 Minutes Intravenous Every 24 hours 10/14/22 1110 10/15/22 1208   10/14/22 0645  azithromycin (ZITHROMAX) 500 mg in sodium chloride 0.9 % 250 mL IVPB        500 mg 250 mL/hr over 60 Minutes Intravenous  Once 10/14/22 0633 10/14/22 0900   10/14/22 0515  cefTRIAXone (ROCEPHIN) 2 g in sodium chloride 0.9 % 100 mL IVPB        2 g 200 mL/hr over 30 Minutes Intravenous  Once 10/14/22 0506 10/14/22 0622          Medications  acetaminophen  1,000 mg Oral Q6H   Or   acetaminophen (TYLENOL) oral liquid 160 mg/5 mL  1,000 mg Oral Q6H   bisacodyl  10 mg Oral Daily   Chlorhexidine Gluconate Cloth  6 each Topical Daily   enoxaparin (LOVENOX) injection  40 mg Subcutaneous Daily   gabapentin  300 mg Oral TID   guaiFENesin  600 mg Oral BID   melatonin  5 mg Oral QHS   miconazole nitrate   Topical BID   pantoprazole  40 mg Oral Daily   polyethylene glycol  17 g Oral Daily   senna-docusate  1 tablet Oral QHS      Subjective:   Julie King was seen and examined today.  Hoping to get the chest tube removed today and will be able to sleep better.  Pain is controlled.  No fevers, cough, nausea vomiting, abdominal pain.   Objective:   Vitals:   10/24/22 2345 10/25/22 0330 10/25/22 0740 10/25/22 1120  BP: 134/88 129/79 132/80 139/81  Pulse:  83 73 88  Resp:  '13 14 16  '$ Temp: 98.7 F (37.1 C) 98.5 F (36.9 C) 98.7 F (37.1 C) 98.7 F (37.1 C)  TempSrc: Oral Oral Oral Oral  SpO2:  99% 99% 100%  Weight:      Height:        Intake/Output Summary (Last 24 hours) at 10/25/2022 1325 Last data filed at 10/25/2022 0500 Gross per 24 hour  Intake 934.98 ml  Output 20 ml  Net 914.98 ml      Wt Readings from Last 3 Encounters:  10/21/22 Marland Kitchen)  145.2 kg  10/13/22 (!) 145.2 kg  10/04/22 (!) 145.5 kg    Physical Exam General: Alert and oriented x 3, NAD Cardiovascular: S1 S2 clear, RRR.  Respiratory: Right lung clear, left base diminished, chest tube + Gastrointestinal: Soft, nontender, nondistended, NBS Ext: no pedal edema bilaterally Neuro: no new deficits Psych: Normal affect    Data Reviewed:  I have personally reviewed following labs    CBC Lab Results  Component Value Date   WBC 9.9 10/25/2022   RBC 4.39 10/25/2022   HGB 7.9 (L) 10/25/2022   HCT 26.8 (L) 10/25/2022   MCV 61.0 (L) 10/25/2022   MCH 18.0 (L) 10/25/2022   PLT 362 10/25/2022   MCHC 29.5 (L) 10/25/2022   RDW 23.3 (H) 10/25/2022   LYMPHSABS 2.0 10/25/2022   MONOABS 0.5 10/25/2022   EOSABS 0.5 10/25/2022   BASOSABS 0.1 123XX123     Last metabolic panel Lab Results  Component Value Date   NA 138 10/25/2022   K 3.9 10/25/2022   CL 105 10/25/2022   CO2 28 10/25/2022   BUN 8 10/25/2022   CREATININE 0.50 10/25/2022   GLUCOSE 88 10/25/2022   GFRNONAA >60 10/25/2022   GFRAA >60 02/04/2020   CALCIUM 8.4 (L) 10/25/2022   PHOS 3.2 10/20/2022   PROT 5.9 (L) 10/24/2022   ALBUMIN 2.1 (L) 10/24/2022   LABGLOB 3.2 01/12/2022   AGRATIO 1.3 01/12/2022   BILITOT 0.5 10/24/2022   ALKPHOS 72 10/24/2022   AST 18 10/24/2022   ALT 22 10/24/2022   ANIONGAP 5 10/25/2022    CBG (last 3)  Recent Labs    10/22/22 1612  GLUCAP 203*      Coagulation Profile: No results for input(s): "INR", "PROTIME" in the last 168 hours.   Radiology Studies: I have personally reviewed the imaging studies  DG CHEST PORT 1 VIEW  Result Date: 10/25/2022 CLINICAL DATA:  Pleural effusion EXAM: PORTABLE CHEST 1 VIEW COMPARISON:  Portable exam 0546 hours compared to 10/24/2022 FINDINGS: RIGHT jugular line with tip projecting over SVC. LEFT thoracostomy tube, second thoracostomy tube removed in interval since  prior study. Enlargement of cardiac silhouette with stable mediastinal contours. Persistent volume loss in LEFT hemithorax with elevation of diaphragm. Persistent small LEFT pleural effusion and LEFT basilar atelectasis. Improved atelectasis RIGHT base. No pneumothorax. IMPRESSION: LEFT chest tube with persistent small LEFT pleural effusion and basilar atelectasis. Electronically Signed   By: Lavonia Dana M.D.   On: 10/25/2022 08:26   DG CHEST PORT 1 VIEW  Result Date: 10/24/2022 CLINICAL DATA:  Left chest tube EXAM: PORTABLE CHEST 1 VIEW COMPARISON:  10/23/2022 FINDINGS: Right internal jugular central line is unchanged with its tip in the SVC at the azygos level. Two left chest tubes remain in place. No pleural air. Abnormal density persists in the left chest consistent with a combination of residual pleural fluid and pulmonary atelectasis or infiltrate. Mild volume loss seen at the right lung base. IMPRESSION: No significant change. No pleural air. Persistent abnormal density in the left chest consistent with a combination of pleural fluid and pulmonary atelectasis or infiltrate. Electronically Signed   By: Nelson Chimes M.D.   On: 10/24/2022 08:03       Letonya Mangels M.D. [facility] 10/25/2022, 1:25 PM  Available via Epic secure chat 7am-7pm After 7 pm, please refer to night coverage provider listed on amion.

## 2022-10-25 NOTE — Progress Notes (Addendum)
      [address]       [place] [address]             [phone]       4 Days Post-Op Procedure(s) (LRB): VIDEO ASSISTED THORACOSCOPY (Left) DRAINAGE OF PLEURAL EFFUSION (Left) DECORTICATION (Left)  Subjective: Patient is not in as much pain today as yesterday. She asked to see her CXR and we reviewed it this am.  Objective: Vital signs in last 24 hours: Temp:  [97.9 F (36.6 C)-98.8 F (37.1 C)] 98.5 F (36.9 C) (02/27 0330) Pulse Rate:  [69-83] 83 (02/27 0330) Cardiac Rhythm: Normal sinus rhythm (02/26 1935) Resp:  [13-18] 13 (02/27 0330) BP: (129-142)/(78-99) 129/79 (02/27 0330) SpO2:  [99 %-100 %] 99 % (02/27 0330)    Intake/Output from previous day: 02/26 0701 - 02/27 0700 In: 1561.6 [P.O.:960; I.V.:201.6; IV Piggyback:400] Out: 670 [Urine:650; Chest Tube:20]   Physical Exam: Cardiovascular: RRR Pulmonary: Clear to auscultation on right and diminished left base Abdomen: Soft, non tender, bowel sounds present. Extremities: SCDs in place Wounds: Clean and dry.  No erythema or signs of infection. Dressing around chest tubes is clean and dry Chest Tube: to water seal, no air leak    Lab Results: CBC: Recent Labs    10/24/22 0517 10/25/22 0600  WBC 9.2 9.9  HGB 7.8* 7.9*  HCT 25.6* 26.8*  PLT 326 362    BMET:  Recent Labs    10/23/22 0435 10/24/22 0517  NA 139 137  K 4.1 3.6  CL 106 104  CO2 25 25  GLUCOSE 87 128*  BUN <5* 9  CREATININE 0.57 0.59  CALCIUM 8.4* 8.3*     PT/INR: No results for input(s): "LABPROT", "INR" in the last 72 hours. ABG:  INR: Will add last result for INR, ABG once components are confirmed Will add last 4 CBG results once components are confirmed  Assessment/Plan: Assessment/Plan:   1. CV - SR. 2.  Pulmonary - On 3 liters of oxygen via [place]. Wean as able. Chest tubes with 20 cc of output last242 hours. Chest tubes are to water seal, no air leak. CXR this am appears stable (left base  atelectasis/small effusion (slowly improving). Hope to remove remaining chest tube. Gram stain showed no organisms and culture shows no growth 2 days. Encourage incentive spirometer and flutter valve. 3. ID-on Vancomycin and Cefepime. WBC this am 9900 4. Anemia-H and H this am stable at 7.9 and 26.8 5. On Lovenox for DVT prophylaxis   Donielle M ZimmermanPA-C 10/25/2022,6:55 AM  Patient seen and examined, agree with above CXR stable, will take weeks to resolve Dc chest tube On Vanco and Cefepime Day 12.  I think she should have at least 2 weeks of IV antibiotics and 6 weeks total.  Original OR cultures grew strep intermedius from lung abscess.  Most recent OR cultures negative. [place] Roxan Hockey, MD [facility] [phone]

## 2022-10-25 NOTE — Progress Notes (Signed)
Chest tube removed per order. 

## 2022-10-26 ENCOUNTER — Inpatient Hospital Stay (HOSPITAL_COMMUNITY): Payer: Medicaid Other

## 2022-10-26 DIAGNOSIS — J90 Pleural effusion, not elsewhere classified: Secondary | ICD-10-CM | POA: Diagnosis not present

## 2022-10-26 DIAGNOSIS — R072 Precordial pain: Secondary | ICD-10-CM | POA: Diagnosis not present

## 2022-10-26 DIAGNOSIS — D72829 Elevated white blood cell count, unspecified: Secondary | ICD-10-CM

## 2022-10-26 DIAGNOSIS — R6510 Systemic inflammatory response syndrome (SIRS) of non-infectious origin without acute organ dysfunction: Secondary | ICD-10-CM | POA: Diagnosis not present

## 2022-10-26 LAB — CBC
HCT: 27.8 % — ABNORMAL LOW (ref 36.0–46.0)
Hemoglobin: 8.3 g/dL — ABNORMAL LOW (ref 12.0–15.0)
MCH: 18.1 pg — ABNORMAL LOW (ref 26.0–34.0)
MCHC: 29.9 g/dL — ABNORMAL LOW (ref 30.0–36.0)
MCV: 60.6 fL — ABNORMAL LOW (ref 80.0–100.0)
Platelets: 370 10*3/uL (ref 150–400)
RBC: 4.59 MIL/uL (ref 3.87–5.11)
RDW: 23.6 % — ABNORMAL HIGH (ref 11.5–15.5)
WBC: 10.6 10*3/uL — ABNORMAL HIGH (ref 4.0–10.5)
nRBC: 0.6 % — ABNORMAL HIGH (ref 0.0–0.2)

## 2022-10-26 LAB — AEROBIC/ANAEROBIC CULTURE W GRAM STAIN (SURGICAL/DEEP WOUND)
Culture: NO GROWTH
Culture: NO GROWTH
Gram Stain: NONE SEEN

## 2022-10-26 LAB — BASIC METABOLIC PANEL
Anion gap: 11 (ref 5–15)
BUN: 8 mg/dL (ref 6–20)
CO2: 23 mmol/L (ref 22–32)
Calcium: 8.9 mg/dL (ref 8.9–10.3)
Chloride: 103 mmol/L (ref 98–111)
Creatinine, Ser: 0.63 mg/dL (ref 0.44–1.00)
GFR, Estimated: >60 mL/min (ref >60)
Glucose, Bld: 83 mg/dL (ref 70–99)
Potassium: 4.1 mmol/L (ref 3.5–5.1)
Sodium: 137 mmol/L (ref 135–145)

## 2022-10-26 LAB — TROPONIN I (HIGH SENSITIVITY): Troponin I (High Sensitivity): 3 ng/L (ref <18)

## 2022-10-26 MED ORDER — MORPHINE SULFATE (PF) 2 MG/ML IV SOLN
2.0000 mg | INTRAVENOUS | Status: DC | PRN
  Administered 2022-10-26 – 2022-10-28 (×7): 2 mg via INTRAVENOUS
  Filled 2022-10-26 (×8): qty 1

## 2022-10-26 MED ORDER — HYDROMORPHONE HCL 2 MG PO TABS
2.0000 mg | ORAL_TABLET | ORAL | Status: DC | PRN
  Administered 2022-10-26 – 2022-10-28 (×9): 2 mg via ORAL
  Filled 2022-10-26 (×9): qty 1

## 2022-10-26 MED ORDER — KETOROLAC TROMETHAMINE 15 MG/ML IJ SOLN
15.0000 mg | Freq: Four times a day (QID) | INTRAMUSCULAR | Status: DC | PRN
  Administered 2022-10-26 – 2022-10-28 (×5): 15 mg via INTRAVENOUS
  Filled 2022-10-26 (×5): qty 1

## 2022-10-26 MED ORDER — HYDROMORPHONE HCL 1 MG/ML IJ SOLN
0.5000 mg | INTRAMUSCULAR | Status: DC | PRN
  Administered 2022-10-26: 0.5 mg via INTRAVENOUS
  Filled 2022-10-26: qty 0.5

## 2022-10-26 NOTE — Progress Notes (Incomplete)
[facility]                                                                              Julie King, is a 32 y.o. 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  Pathology negative for malignancy and showed inflammation with focal necrosis, fibrosis and pneumocyte hyperplasia.  Discharge home after surgery.     Presented back to ED with 3 days of worsening left-sided chest pain, subjective fever, cough, nausea and orthopnea. CTA with postsurgical changes small left pleural effusion. Due to persistent leukocytosis and febrile, chest x-ray was repeated on 2/19 which showed increased effusion on the left side. IR guided thoracentesis 500 mL removed, no growth so far.  Underwent VATS on 2/23   Assessment & Plan    Principal Problem: Sepsis present on admission, left lower lobe pneumonia with complicated parapneumonic effusion with pleurisy Postop pleural space infection -Blood cultures NTD, sputum cultures 2/17 showed respiratory flora.  Surgical cultures from 2/8 showed Streptococcus intermedius -Fluid culture from thoracentesis 2/19 showed no growth. -CTA negative for PE, Doppler ultrasound negative for DVT - underwent VATS and drainage of left pleural effusion on 2/23, cultures NTD - chest tubes now dc'ed  -CT surgery following, recommended at least 2 weeks of IV antibiotics, on IV cefepime and vancomycin, then 4 weeks of oral antibiotics.  Will discuss with ID regarding oral antibiotics for dc planning. - Pain    Active problems  Acute on chronic microcytic  anemia, iron deficiency  - Baseline hemoglobin about 9-10.  Iron 16, ferritin 104, percent saturation ratio 8 -H&H improving, 8.3  Leukocytosis -Current broad-spectrum antibiotics -Procalcitonin less than 0.1 -WBC slightly trended up to 10.6 today    Morbid obesity Estimated body mass index is 50.12 kg/m as calculated from the following:   Height as of this encounter: '5\' 7"'$  (1.702 m).   Weight as of this encounter: 145.2 kg.  Code Status: Full code DVT Prophylaxis:  enoxaparin (LOVENOX) injection 40 mg Start: 10/22/22 1400 SCD's Start: 10/21/22 1721   Level of Care: Level of care: Telemetry Medical Family Communication: Updated patient Disposition Plan:      Remains inpatient appropriate: Possible DC home in a.m.   Procedures:   VIDEO ASSISTED THORACOSCOPY (Left) DRAINAGE OF PLEURAL EFFUSION (Left) DECORTICATION (Left)  Consultants:   Cardiothoracic surgery ID  Antimicrobials:   Anti-infectives (From admission, onward)    Start     Dose/Rate Route Frequency Ordered Stop   10/18/22 0800  vancomycin (VANCOREADY) IVPB 1500 mg/300 mL        1,500 mg 150 mL/hr over 120 Minutes Intravenous Every 8 hours 10/18/22 0147     10/16/22 1230  fluconazole (DIFLUCAN)  tablet 200 mg        200 mg Oral  Once 10/16/22 1135 10/16/22 1235   10/16/22 1000  azithromycin (ZITHROMAX) tablet 500 mg        500 mg Oral Daily 10/15/22 1233 10/16/22 0919   10/16/22 0130  vancomycin (VANCOREADY) IVPB 1500 mg/300 mL  Status:  Discontinued        1,500 mg 150 mL/hr over 120 Minutes Intravenous Every 12 hours 10/15/22 1245 10/18/22 0147   10/15/22 1330  vancomycin (VANCOREADY) IVPB 2000 mg/400 mL        2,000 mg 200 mL/hr over 120 Minutes Intravenous  Once 10/15/22 1233 10/15/22 1609   10/15/22 1330  ceFEPIme (MAXIPIME) 2 g in sodium chloride 0.9 % 100 mL IVPB        2 g 200 mL/hr over 30 Minutes Intravenous Every 8 hours 10/15/22 1233     10/15/22 1000  azithromycin (ZITHROMAX) 500 mg in  sodium chloride 0.9 % 250 mL IVPB  Status:  Discontinued        500 mg 250 mL/hr over 60 Minutes Intravenous Every 24 hours 10/14/22 1110 10/15/22 1208   10/15/22 0600  cefTRIAXone (ROCEPHIN) 2 g in sodium chloride 0.9 % 100 mL IVPB  Status:  Discontinued        2 g 200 mL/hr over 30 Minutes Intravenous Every 24 hours 10/14/22 1110 10/15/22 1208   10/14/22 0645  azithromycin (ZITHROMAX) 500 mg in sodium chloride 0.9 % 250 mL IVPB        500 mg 250 mL/hr over 60 Minutes Intravenous  Once 10/14/22 0633 10/14/22 0900   10/14/22 0515  cefTRIAXone (ROCEPHIN) 2 g in sodium chloride 0.9 % 100 mL IVPB        2 g 200 mL/hr over 30 Minutes Intravenous  Once 10/14/22 0506 10/14/22 0622          Medications  acetaminophen  1,000 mg Oral Q6H   Or   acetaminophen (TYLENOL) oral liquid 160 mg/5 mL  1,000 mg Oral Q6H   bisacodyl  10 mg Oral Daily   Chlorhexidine Gluconate Cloth  6 each Topical Daily   enoxaparin (LOVENOX) injection  40 mg Subcutaneous Daily   gabapentin  300 mg Oral TID   guaiFENesin  600 mg Oral BID   melatonin  5 mg Oral QHS   miconazole nitrate   Topical BID   pantoprazole  40 mg Oral Daily   polyethylene glycol  17 g Oral Daily   senna-docusate  1 tablet Oral QHS      Subjective:   Julie King was seen and examined today.  Continues to complain of pain in the left chest.   No fevers, cough, nausea vomiting, abdominal pain.   Objective:   Vitals:   10/25/22 1951 10/26/22 0003 10/26/22 0424 10/26/22 0800  BP: 139/67 (!) 121/49 125/75 (!) 147/79  Pulse: 84 84 68 96  Resp: '20 14 20 17  '$ Temp: 98.6 F (37 C) 99.3 F (37.4 C) 98.1 F (36.7 C)   TempSrc: Oral Oral Oral   SpO2: 97% 100% 100% 100%  Weight:      Height:        Intake/Output Summary (Last 24 hours) at 10/26/2022 0824 Last data filed at 10/26/2022 0200 Gross per 24 hour  Intake 301 ml  Output 2000 ml  Net -1699 ml     Wt Readings from Last 3 Encounters:  10/21/22 (!) 145.2 kg  10/13/22  (!) 145.2 kg  10/04/22 Marland Kitchen)  145.5 kg    Physical Exam General: Alert and oriented x 3, NAD Cardiovascular: S1 S2 clear, RRR.  Respiratory: Right lung clear, left base diminished, chest tube + Gastrointestinal: Soft, nontender, nondistended, NBS Ext: no pedal edema bilaterally Neuro: no new deficits Psych: Normal affect    Data Reviewed:  I have personally reviewed following labs    CBC Lab Results  Component Value Date   WBC 10.6 (H) 10/26/2022   RBC 4.59 10/26/2022   HGB 8.3 (L) 10/26/2022   HCT 27.8 (L) 10/26/2022   MCV 60.6 (L) 10/26/2022   MCH 18.1 (L) 10/26/2022   PLT 370 10/26/2022   MCHC 29.9 (L) 10/26/2022   RDW 23.6 (H) 10/26/2022   LYMPHSABS 2.0 10/25/2022   MONOABS 0.5 10/25/2022   EOSABS 0.5 10/25/2022   BASOSABS 0.1 123XX123     Last metabolic panel Lab Results  Component Value Date   NA 137 10/26/2022   K 4.1 10/26/2022   CL 103 10/26/2022   CO2 23 10/26/2022   BUN 8 10/26/2022   CREATININE 0.63 10/26/2022   GLUCOSE 83 10/26/2022   GFRNONAA >60 10/26/2022   GFRAA >60 02/04/2020   CALCIUM 8.9 10/26/2022   PHOS 3.2 10/20/2022   PROT 5.9 (L) 10/24/2022   ALBUMIN 2.1 (L) 10/24/2022   LABGLOB 3.2 01/12/2022   AGRATIO 1.3 01/12/2022   BILITOT 0.5 10/24/2022   ALKPHOS 72 10/24/2022   AST 18 10/24/2022   ALT 22 10/24/2022   ANIONGAP 11 10/26/2022    CBG (last 3)  No results for input(s): "GLUCAP" in the last 72 hours.     Coagulation Profile: No results for input(s): "INR", "PROTIME" in the last 168 hours.   Radiology Studies: I have personally reviewed the imaging studies  DG CHEST PORT 1 VIEW  Result Date: 10/25/2022 CLINICAL DATA:  Pleural effusion EXAM: PORTABLE CHEST 1 VIEW COMPARISON:  Portable exam 0546 hours compared to 10/24/2022 FINDINGS: RIGHT jugular line with tip projecting over SVC. LEFT thoracostomy tube, second thoracostomy tube removed in interval since prior study. Enlargement of cardiac silhouette with stable  mediastinal contours. Persistent volume loss in LEFT hemithorax with elevation of diaphragm. Persistent small LEFT pleural effusion and LEFT basilar atelectasis. Improved atelectasis RIGHT base. No pneumothorax. IMPRESSION: LEFT chest tube with persistent small LEFT pleural effusion and basilar atelectasis. Electronically Signed   By: Lavonia Dana M.D.   On: 10/25/2022 08:26       Alecsander Hattabaugh M.D. [facility] 10/26/2022, 8:24 AM  Available via Epic secure chat 7am-7pm After 7 pm, please refer to night coverage provider listed on amion.

## 2022-10-26 NOTE — Progress Notes (Addendum)
      [address]       [place] [address]             [phone]       5 Days Post-Op Procedure(s) (LRB): VIDEO ASSISTED THORACOSCOPY (Left) DRAINAGE OF PLEURAL EFFUSION (Left) DECORTICATION (Left)  Subjective: Patient with complaints of pain across her chest (more so on the left).  Objective: Vital signs in last 24 hours: Temp:  [98.1 F (36.7 C)-99.3 F (37.4 C)] 98.1 F (36.7 C) (02/28 0424) Pulse Rate:  [68-88] 68 (02/28 0424) Cardiac Rhythm: Normal sinus rhythm (02/27 1900) Resp:  [14-24] 20 (02/28 0424) BP: (121-139)/(49-81) 125/75 (02/28 0424) SpO2:  [97 %-100 %] 100 % (02/28 0424)    Intake/Output from previous day: 02/27 0701 - 02/28 0700 In: 301 [IV Piggyback:301] Out: 2000 [Urine:2000]   Physical Exam: Cardiovascular: RRR Pulmonary: Clear to auscultation on right and slightly diminished left base Abdomen: Soft, non tender, bowel sounds present. Extremities: No calf tenderness Wounds: Clean and dry.  No erythema or signs of infection. Dressing around chest tubes is clean and dry     Lab Results: CBC: Recent Labs    10/25/22 0600 10/26/22 0519  WBC 9.9 10.6*  HGB 7.9* 8.3*  HCT 26.8* 27.8*  PLT 362 370    BMET:  Recent Labs    10/25/22 0613 10/26/22 0519  NA 138 137  K 3.9 4.1  CL 105 103  CO2 28 23  GLUCOSE 88 83  BUN 8 8  CREATININE 0.50 0.63  CALCIUM 8.4* 8.9     PT/INR: No results for input(s): "LABPROT", "INR" in the last 72 hours. ABG:  INR: Will add last result for INR, ABG once components are confirmed Will add last 4 CBG results once components are confirmed  Assessment/Plan: Assessment/Plan:   1. CV - SR. 2.  Pulmonary - On 3 liters of oxygen via [place]. Wean as able. CXR this am appears stable (left base atelectasis/small effusion (slowly improving)).  Previous culture showed Strep Intermedius. Recent OR culture shows no growth 3 days. Sputum gram stain shows gram positive cocci, no Staph or  Pseudomonas in culture. Encourage incentive spirometer and flutter valve. 3. ID-on Vancomycin and Cefepime. WBC this am 10,600. Per Dr. Leonarda Salon recommendations, transition to oral antibiotic for 4 weeks (total treatment with antibiotics 6 weeks: 2 weeks IV antibiotic followed by  oral for 4 weeks.) 4. Anemia-H and H this am stable at 8.3 and 27.8 5. On Lovenox for DVT prophylaxis 6. Regarding pain control, she is on scheduled Neurontin 300 mg tid, Oxy PRN, Dilaudid IV PRN. I offered to titrate medication but she shook her head no.  Donielle M ZimmermanPA-C 10/26/2022,6:56 AM  Patient seen and examined, agree with above CXR looks good for a post decortication film Emphasized the need to ambulate  Remo Lipps C. Roxan Hockey, MD [facility] [phone]

## 2022-10-26 NOTE — Progress Notes (Signed)
SATURATION QUALIFICATIONS: (This note is used to comply with regulatory documentation for home oxygen)  Patient Saturations on Room Air at Rest = 98%  Patient Saturations on Room Air while Ambulating = 88%  Patient Saturations on 2 Liters of oxygen while Ambulating = 92%  Please briefly explain why patient needs home oxygen:

## 2022-10-26 NOTE — Progress Notes (Signed)
Mobility Specialist Progress Note   10/26/22 1215  Mobility  Activity Ambulated with assistance in hallway;Dangled on edge of bed  Level of Assistance Modified independent, requires aide device or extra time  Assistive Device None  Distance Ambulated (ft) 320 ft  Range of Motion/Exercises Active;All extremities  Activity Response Tolerated well   Pre Mobility:  HR 105, SpO2 100% 2LO2 During Mobility: HR 125+/-,  SpO2 92% 2LO2 (88% on RA) Post Mobility: HR 101, SpO2 97% 2LO2  Patient received in supine and agreeable to participate. Prior to ambulation, completed education  with receptive teach back on energy conservation techniques, pursed lip breathing, and exercise/activity frequency. Ambulated independently with slow steady gait. Did become slightly lightheaded and SOB. Oxygen desaturated on RA requiring 2LO2 to maintain a saturation >92%. Returned to room without incident. Was left dangling EOB with all needs met, call bell in reach.   [place] Cambree Hendrix, BS EXP Mobility Specialist Please contact via SecureChat or Rehab office at [phone]

## 2022-10-26 NOTE — Consult Note (Incomplete)
[facility] for Infectious [facility]    Date of Admission:  10/14/2022     Total days of antibiotics 12   Vancomycin 2/17 >> c  Cefepime 2/17 >> c                 Reason for Consult: Empyema     Referring Provider: Roxan Hockey Primary Care Provider: Hattie Perch, MD   Assessment: Julie King is a 32 y.o. 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 She developed post op pneumonia with parapneumonic effusion requiring further surgical intervention to clean out pleural space on Feb 23rd.  So far the only new culture data reveals rare growth of staph epidermidis from pleural tissue with susceptibilities pending. Sputum did not reveal and staph aureus or pseudomonas. May be able to do vancomycin monotherapy to get the staph and strep previously recovered. Further decisions pending micro data.   OK to place PICC line when CT surgery team ready to transition from [employer]. Sounds like she should be ready for this.   Agree with plan for 2 weeks parenteral therapy from source control followed by 4 weeks PO.    Plan: Vancomycin + ceftriaxone for now (does not need pseudomonal coverage)  OK to place PICC line (defer timing of central line removal to TCTS) Follow pending micro data for further recommendations.     Principal Problem:   SIRS (systemic inflammatory response syndrome) (HCC) Active Problems:   Morbidly obese (HCC)   Lung mass   Healthcare-associated pneumonia   Pleural effusion   Microcytic hypochromic anemia   Tobacco abuse   Chest pain   Pneumonia    acetaminophen  1,000 mg Oral Q6H   Or   acetaminophen (TYLENOL) oral liquid 160 mg/5 mL  1,000 mg Oral Q6H   bisacodyl  10 mg Oral Daily   Chlorhexidine Gluconate Cloth  6 each Topical Daily   enoxaparin (LOVENOX) injection  40 mg Subcutaneous Daily   gabapentin  300 mg Oral TID    guaiFENesin  600 mg Oral BID   melatonin  5 mg Oral QHS   miconazole nitrate   Topical BID   pantoprazole  40 mg Oral Daily   polyethylene glycol  17 g Oral Daily   senna-docusate  1 tablet Oral QHS    HPI: Julie King is a 31 y.o. female with history of tobacco abuse, obesity, pAF, HTN.    She has history of chronic hemoptysis since Oct 2023 where left mass was identified. She has been working with Dr. Roxan Hockey since that time to further characterize and arrive at diagnosis (malignancy vs sarcoid vs infectious). On Feb 8th she underwent Robotic assisted L VATS wedge resection of lung nodule involving the upper and lower lobes. Pathology was negative for malignancy and concluded possible resolving infarction and abscess. Streptotoccus intermedius grew out at that time (PCN MIC < 0.06)  She was readmitted to the hospital on Feb 16th for fever, increased left sided chest pains, SOB, hypoxia.  She was started on vancomycin + cefepime empirically. Underwent VATS 2/23 for further drainage of pleural space dt complex loculated effusion - "murky fluid was evacuated and sent for cultures, increased inflammatory exudate and adhesions present, white spots on the peel; peel was debrided off the lingula until good reexpansion."   Has central line in R IJ in place since 2/23.  All chest tubes have been removed. CXR's look acceptable to surgery team.   Cultures from recent OR trip Pleural Tissue growing rare staphylococcus epidermidis susceptibilities pending.  Evacuated pleural fluid no growth at 4 days.  Respiratory cultures negative   All collected AFB smears are negative with cultures pending ('@5d'$ ).    Review of Systems: ROS  Past Medical History:  Diagnosis Date   Atrial fibrillation ([place]) [id number]   Complication of anesthesia    wakes up emotional   Family history of adverse reaction to anesthesia    mother has n & v   Family history of breast cancer    Headache 07/25/2017    Hypertension    Irritable bowel syndrome    Morbidly obese ([place]) 07/25/2017   PONV (postoperative nausea and vomiting)    QT prolongation    T wave inversion in EKG     Social History   Tobacco Use   Smoking status: Former    Packs/day: 0.50    Years: 10.00    Total pack years: 5.00    Types: Cigarettes   Smokeless tobacco: Never   Tobacco comments:    Smokes about 1/2 pack per day 10/04/22 ep  Vaping Use   Vaping Use: Never used  Substance Use Topics   Alcohol use: Yes    Comment: rarely   Drug use: No    Family History  Problem Relation Age of Onset   Hypertension Other    Diabetes Other    Crohn's disease Mother    Hypertension Mother    Thyroid disease Father    Diabetes Father    Breast cancer Maternal Aunt 57   Breast cancer Paternal Grandmother        dx under 32   Breast cancer Other 80       MGMs sister, currently has stage IV   Allergies  Allergen Reactions   Percocet [Oxycodone-Acetaminophen] Hives, Itching and Nausea Only   Pineapple Itching    Fresh pineapples; pineapples from a can is okay    OBJECTIVE: Blood pressure (!) 147/79, pulse 96, temperature 98.1 F (36.7 C), temperature source Oral, resp. rate 17, height '5\' 7"'$  (1.702 m), weight (!) 145.2 kg, last menstrual period 09/30/2022, SpO2 100 %.  Physical Exam  Lab Results Lab Results  Component Value Date   WBC 10.6 (H) 10/26/2022   HGB 8.3 (L) 10/26/2022   HCT 27.8 (L) 10/26/2022   MCV 60.6 (L) 10/26/2022   PLT 370 10/26/2022    Lab Results  Component Value Date   CREATININE 0.63 10/26/2022   BUN 8 10/26/2022   NA 137 10/26/2022   K 4.1 10/26/2022   CL 103 10/26/2022   CO2 23 10/26/2022    Lab Results  Component Value Date   ALT 22 10/24/2022   AST 18 10/24/2022   ALKPHOS 72 10/24/2022   BILITOT 0.5 10/24/2022     Microbiology: Recent Results (from the past 240 hour(s))  Fungus Culture With Stain     Status: None (Preliminary result)   Collection Time: 10/17/22 12:32  PM   Specimen: Lung, Left; Pleural Fluid  Result Value Ref Range Status   Fungus Stain Final report  Final    Comment: (NOTE) Performed At: [facility] [address] [place], [place] [id number] Rush Farmer MD [id number]    Fungus (Mycology) Culture PENDING  Incomplete   Fungal Source FLUID  Final    Comment: Performed at [facility], [place] [address] [place], [place]  [address]  Body fluid culture w Gram Stain     Status: None   Collection Time: 10/17/22 12:32 PM   Specimen: Lung, Left; Pleural Fluid  Result Value Ref Range Status   Specimen Description FLUID  Final   Special Requests  PLEURAL , LEFT LUNG  Final   Gram Stain   Final    WBC PRESENT,BOTH PMN AND MONONUCLEAR NO ORGANISMS SEEN CYTOSPIN SMEAR    Culture   Final    NO GROWTH 3 DAYS Performed at [facility], [place] [address] [place], [place] [address]    Report Status 10/21/2022 FINAL  Final  Fungus Culture Result     Status: None   Collection Time: 10/17/22 12:32 PM  Result Value Ref Range Status   Result 1 Comment  Final    Comment: (NOTE) KOH/Calcofluor preparation:  no fungus observed. Performed At: [facility] [place], [place] [id number] Rush Farmer MD [id number]   Aerobic/Anaerobic Culture w Gram Stain (surgical/deep wound)     Status: None (Preliminary result)   Collection Time: 10/21/22 12:34 PM   Specimen: PATH Respiratory resection; Body Fluid  Result Value Ref Range Status   Specimen Description PLEURAL FLUID LEFT  Final   Special Requests PT ON CEFEPIME VANC  Final   Gram Stain NO WBC SEEN NO ORGANISMS SEEN   Final   Culture   Final    NO GROWTH 4 DAYS NO ANAEROBES ISOLATED; CULTURE IN PROGRESS FOR 5 DAYS Performed at [facility], [address], [place], [place] [address]    Report Status PENDING  Incomplete  Acid Fast Smear (AFB)     Status: None   Collection Time: 10/21/22 12:34 PM   Specimen: PATH Respiratory resection; Body  Fluid  Result Value Ref Range Status   AFB Specimen Processing Concentration  Final   Acid Fast Smear Negative  Final    Comment: (NOTE) Performed At: [facility] [place], [place] [id number] Rush Farmer MD [id number]    Source (AFB) PLEURAL  Corrected    Comment: LEFT FLUID Performed at [facility], [place] [address] [place], [place] [address] CORRECTED ON 02/23 AT 1551: PREVIOUSLY REPORTED AS TISSUE LEFT ANKLE   Aerobic/Anaerobic Culture w Gram Stain (surgical/deep wound)     Status: None (Preliminary result)   Collection Time: 10/21/22 12:50 PM   Specimen: PATH Respiratory resection; Tissue  Result Value Ref Range Status   Specimen Description TISSUE PLEURAL  Final   Special Requests  PEEL PT ON VANC CEFEPIME  Final   Gram Stain   Final    RARE WBC PRESENT, PREDOMINANTLY PMN NO ORGANISMS SEEN Performed at [facility], [address], [place], [place] [address]    Culture   Final    RARE STAPHYLOCOCCUS EPIDERMIDIS CULTURE REINCUBATED FOR BETTER GROWTH NO ANAEROBES ISOLATED; CULTURE IN PROGRESS FOR 5 DAYS    Report Status PENDING  Incomplete  Acid Fast Smear (AFB)     Status: None   Collection Time: 10/21/22 12:50 PM   Specimen: PATH Respiratory resection; Tissue  Result Value Ref Range Status   AFB Specimen Processing Comment  Final    Comment: Tissue Grinding and Digestion/Decontamination   Acid Fast Smear Negative  Final    Comment: (NOTE) Performed At: [facility] [place], [place] [id number] Rush Farmer MD [id number]    Source (AFB) TISSUE  Final    Comment: PLEURAL PEEL Performed at [facility], [place] [address] [place], [place] [address]   Aerobic/Anaerobic Culture w Gram Stain (surgical/deep wound)     Status: None (Preliminary result)   Collection Time: 10/21/22  1:22 PM   Specimen: PATH Respiratory resection; Tissue  Result Value Ref Range Status   Specimen Description TISSUE  PLEURAL  Final   Special Requests PEEL PT ON CEFEPIME VANC  Final   Gram Stain   Final    FEW WBC PRESENT,BOTH PMN AND MONONUCLEAR NO ORGANISMS SEEN    Culture   Final    NO GROWTH 3 DAYS NO ANAEROBES ISOLATED; CULTURE IN PROGRESS FOR 5 DAYS Performed at [facility], [place] [address], [place], [place] [address]    Report Status PENDING  Incomplete  Acid Fast Smear (AFB)     Status: None   Collection Time: 10/21/22  1:22 PM   Specimen: PATH Respiratory resection; Tissue  Result Value Ref Range Status   AFB Specimen Processing Comment  Final    Comment: Tissue Grinding and Digestion/Decontamination   Acid Fast Smear Negative  Final    Comment: (NOTE) Performed At: [facility] [place], [place] [id number] Rush Farmer MD [id number]    Source (AFB) TISSUE  Final    Comment: PLEURAL PEEL NO 2 Performed at [facility], [place] [address] [place], [place] [address]     Janene Madeira, MSN, NP-C [facility] for Infectious [place] [facility] Pager: [phone]  10/26/2022 8:57 AM

## 2022-10-26 NOTE — Progress Notes (Signed)
[facility]                                                                              Julie King, is a 32 y.o. female, DOB - Sep 04, 1990, [id number] Admit date - 10/14/2022    Outpatient Primary MD for the patient is Songer, Alda Berthold, MD  LOS - 11  days  Chief Complaint  Patient presents with   Chest Pain   Back Pain       Brief summary   32 year old with recently diagnosed lingular mass, robotic biopsy 06/2022 negative for malignancy, repeat CT scans with increasing size and hemoptysis.   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  Pathology negative for malignancy and showed inflammation with focal necrosis, fibrosis and pneumocyte hyperplasia.  Discharge home after surgery.     Presented back to ED with 3 days of worsening left-sided chest pain, subjective fever, cough, nausea and orthopnea. CTA with postsurgical changes small left pleural effusion. Due to persistent leukocytosis and febrile, chest x-ray was repeated on 2/19 which showed increased effusion on the left side. IR guided thoracentesis 500 mL removed, no growth so far.  Underwent VATS on 2/23   Assessment & Plan    Principal Problem: Sepsis present on admission, left lower lobe pneumonia with complicated parapneumonic effusion with pleurisy Postop pleural space infection -Blood cultures NTD, sputum cultures 2/17 showed respiratory flora.  Surgical cultures from 2/8 with Streptococcus. -Fluid culture from thoracentesis 2/19 showed no growth. -CTA negative for PE, Doppler ultrasound negative for DVT - underwent VATS and drainage of left pleural effusion on 2/23, cultures NTD -Chest tubes removed -ID consulted, will await recommendations regarding the antibiotics, duration for disposition -DC'd IV Dilaudid, placed back on oral Dilaudid per home dose, IV Toradol as needed for breakthrough pain, bowel regimen  Active problems  Acute on chronic microcytic anemia, iron  deficiency  - Baseline hemoglobin about 9-10.  Iron 16, ferritin 104, percent saturation ratio 8 -H&H improving, 8.3  Leukocytosis -Current broad-spectrum antibiotics -Procalcitonin less than 0.1 -Slightly trended up today, ID consulted    Morbid obesity Estimated body mass index is 50.12 kg/m as calculated from the following:   Height as of this encounter: '5\' 7"'$  (1.702 m).   Weight as of this encounter: 145.2 kg.  Code Status: Full code DVT Prophylaxis:  enoxaparin (LOVENOX) injection 40 mg Start: 10/22/22 1400 SCD's Start: 10/21/22 1721   Level of Care: Level of care: Telemetry Medical Family Communication: Updated patient Disposition Plan:      Remains inpatient appropriate: Once cleared by CT surgery and awaiting ID recommendations,  Procedures:   VIDEO ASSISTED THORACOSCOPY (Left) DRAINAGE OF PLEURAL EFFUSION (Left) DECORTICATION (Left)  Consultants:   Cardiothoracic surgery  Antimicrobials:   Anti-infectives (From admission, onward)    Start     Dose/Rate Route Frequency Ordered Stop   10/18/22 0800  vancomycin (VANCOREADY) IVPB 1500 mg/300 mL        1,500 mg 150 mL/hr over 120 Minutes Intravenous Every 8 hours 10/18/22 0147     10/16/22 1230  fluconazole (DIFLUCAN) tablet 200 mg  200 mg Oral  Once 10/16/22 1135 10/16/22 1235   10/16/22 1000  azithromycin (ZITHROMAX) tablet 500 mg        500 mg Oral Daily 10/15/22 1233 10/16/22 0919   10/16/22 0130  vancomycin (VANCOREADY) IVPB 1500 mg/300 mL  Status:  Discontinued        1,500 mg 150 mL/hr over 120 Minutes Intravenous Every 12 hours 10/15/22 1245 10/18/22 0147   10/15/22 1330  vancomycin (VANCOREADY) IVPB 2000 mg/400 mL        2,000 mg 200 mL/hr over 120 Minutes Intravenous  Once 10/15/22 1233 10/15/22 1609   10/15/22 1330  ceFEPIme (MAXIPIME) 2 g in sodium chloride 0.9 % 100 mL IVPB        2 g 200 mL/hr over 30 Minutes Intravenous Every 8 hours 10/15/22 1233     10/15/22 1000  azithromycin  (ZITHROMAX) 500 mg in sodium chloride 0.9 % 250 mL IVPB  Status:  Discontinued        500 mg 250 mL/hr over 60 Minutes Intravenous Every 24 hours 10/14/22 1110 10/15/22 1208   10/15/22 0600  cefTRIAXone (ROCEPHIN) 2 g in sodium chloride 0.9 % 100 mL IVPB  Status:  Discontinued        2 g 200 mL/hr over 30 Minutes Intravenous Every 24 hours 10/14/22 1110 10/15/22 1208   10/14/22 0645  azithromycin (ZITHROMAX) 500 mg in sodium chloride 0.9 % 250 mL IVPB        500 mg 250 mL/hr over 60 Minutes Intravenous  Once 10/14/22 0633 10/14/22 0900   10/14/22 0515  cefTRIAXone (ROCEPHIN) 2 g in sodium chloride 0.9 % 100 mL IVPB        2 g 200 mL/hr over 30 Minutes Intravenous  Once 10/14/22 0506 10/14/22 0622          Medications  acetaminophen  1,000 mg Oral Q6H   Or   acetaminophen (TYLENOL) oral liquid 160 mg/5 mL  1,000 mg Oral Q6H   bisacodyl  10 mg Oral Daily   Chlorhexidine Gluconate Cloth  6 each Topical Daily   enoxaparin (LOVENOX) injection  40 mg Subcutaneous Daily   gabapentin  300 mg Oral TID   guaiFENesin  600 mg Oral BID   melatonin  5 mg Oral QHS   miconazole nitrate   Topical BID   pantoprazole  40 mg Oral Daily   polyethylene glycol  17 g Oral Daily   senna-docusate  1 tablet Oral QHS      Subjective:   Julie King was seen and examined today.  Complaining of chest pain, chest tubes removed yesterday.  No fevers, cough, nausea vomiting or abdominal pain.  Objective:   Vitals:   10/24/22 2345 10/25/22 0330 10/25/22 0740 10/25/22 1120  BP: 134/88 129/79 132/80 139/81  Pulse:  83 73 88  Resp:  '13 14 16  '$ Temp: 98.7 F (37.1 C) 98.5 F (36.9 C) 98.7 F (37.1 C) 98.7 F (37.1 C)  TempSrc: Oral Oral Oral Oral  SpO2:  99% 99% 100%  Weight:      Height:        Intake/Output Summary (Last 24 hours) at 10/25/2022 1325 Last data filed at 10/25/2022 0500 Gross per 24 hour  Intake 934.98 ml  Output 20 ml  Net 914.98 ml     Wt Readings from Last 3  Encounters:  10/21/22 (!) 145.2 kg  10/13/22 (!) 145.2 kg  10/04/22 (!) 145.5 kg   Physical Exam General: Alert and oriented x  3, NAD Cardiovascular: S1 S2 clear, RRR.  Respiratory: Slightly diminished at the left base Gastrointestinal: Soft, nontender, nondistended, NBS Ext: no pedal edema bilaterally Neuro: no new deficits Psych: Normal affect   Data Reviewed:  I have personally reviewed following labs    CBC Lab Results  Component Value Date   WBC 9.9 10/25/2022   RBC 4.39 10/25/2022   HGB 7.9 (L) 10/25/2022   HCT 26.8 (L) 10/25/2022   MCV 61.0 (L) 10/25/2022   MCH 18.0 (L) 10/25/2022   PLT 362 10/25/2022   MCHC 29.5 (L) 10/25/2022   RDW 23.3 (H) 10/25/2022   LYMPHSABS 2.0 10/25/2022   MONOABS 0.5 10/25/2022   EOSABS 0.5 10/25/2022   BASOSABS 0.1 [id number]     Last metabolic panel Lab Results  Component Value Date   NA 138 10/25/2022   K 3.9 10/25/2022   CL 105 10/25/2022   CO2 28 10/25/2022   BUN 8 10/25/2022   CREATININE 0.50 10/25/2022   GLUCOSE 88 10/25/2022   GFRNONAA >60 10/25/2022   GFRAA >60 02/04/2020   CALCIUM 8.4 (L) 10/25/2022   PHOS 3.2 10/20/2022   PROT 5.9 (L) 10/24/2022   ALBUMIN 2.1 (L) 10/24/2022   LABGLOB 3.2 01/12/2022   AGRATIO 1.3 01/12/2022   BILITOT 0.5 10/24/2022   ALKPHOS 72 10/24/2022   AST 18 10/24/2022   ALT 22 10/24/2022   ANIONGAP 5 10/25/2022    CBG (last 3)  Recent Labs    10/22/22 1612  GLUCAP 203*      Coagulation Profile: No results for input(s): "INR", "PROTIME" in the last 168 hours.   Radiology Studies: I have personally reviewed the imaging studies  DG CHEST PORT 1 VIEW  Result Date: 10/25/2022 CLINICAL DATA:  Pleural effusion EXAM: PORTABLE CHEST 1 VIEW COMPARISON:  Portable exam 0546 hours compared to 10/24/2022 FINDINGS: RIGHT jugular line with tip projecting over SVC. LEFT thoracostomy tube, second thoracostomy tube removed in interval since prior study. Enlargement of cardiac silhouette with  stable mediastinal contours. Persistent volume loss in LEFT hemithorax with elevation of diaphragm. Persistent small LEFT pleural effusion and LEFT basilar atelectasis. Improved atelectasis RIGHT base. No pneumothorax. IMPRESSION: LEFT chest tube with persistent small LEFT pleural effusion and basilar atelectasis. Electronically Signed   By: Lavonia Dana M.D.   On: 10/25/2022 08:26   DG CHEST PORT 1 VIEW  Result Date: 10/24/2022 CLINICAL DATA:  Left chest tube EXAM: PORTABLE CHEST 1 VIEW COMPARISON:  10/23/2022 FINDINGS: Right internal jugular central line is unchanged with its tip in the SVC at the azygos level. Two left chest tubes remain in place. No pleural air. Abnormal density persists in the left chest consistent with a combination of residual pleural fluid and pulmonary atelectasis or infiltrate. Mild volume loss seen at the right lung base. IMPRESSION: No significant change. No pleural air. Persistent abnormal density in the left chest consistent with a combination of pleural fluid and pulmonary atelectasis or infiltrate. Electronically Signed   By: Nelson Chimes M.D.   On: 10/24/2022 08:03       Liliane Mallis M.D. [facility] 10/25/2022, 1:25 PM  Available via Epic secure chat 7am-7pm After 7 pm, please refer to night coverage provider listed on amion.

## 2022-10-26 NOTE — Consult Note (Signed)
[name]    Date of Admission:  10/14/2022   Total days of inpatient antibiotics 11        Reason for Consult: Empyema    Principal Problem:   SIRS (systemic inflammatory response syndrome) (HCC) Active Problems:   Morbidly obese (HCC)   Lung mass   Healthcare-associated pneumonia   Pleural effusion   Microcytic hypochromic anemia   Tobacco abuse   Chest pain   Pneumonia   Assessment: 32 year old female with history of tobacco abuse who recently underwent wedge resection for lung abscess involving the lingula and left lower lobe admitted for pneumonia found to have empyema.   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  Workup pressure lingula nodule across the fissure involves the lower lobe.  PET/CT showed high-grade activity in the lingular nodule and moderate to the hilar node.  On 2/8 she underwent bronchoscopy, VATS, wedge resection of lung nodule involving the upper and lower lobes.  Path showed focal necrosis, fibrosis, pneumocyte hyperplasia.  No malignancy. -Patient developed worsening chest pain, she states it started on Super Bowl Sunday.   -She had presented with temp 100.6 WBC 15.9 K.  CT chest showed worsening atelectasis and moderate left pleural effusion.  Started on vancomycin and cefepime.  On 2/17 sputum cultures growing rare normal respiratory flora. - Underwent thoracentesis on 2/19 with negative cultures.  Due to fluid rapidly recurred and patient underwent VATS for drainage effusion decortication.  Findings showed complex loculated effusion, peel over anterior upper lobe and majority of lower lobe.  Tissue cultures growing 1/3 rare staph epi. Recommendations:  - Continue Vanc and ctx - Follow AFB cultures - Transition to  PO regimen pending final Cx tomorrow(likely augmetin bid as cultures have only grown strep intermedius, clinical significance  staph epi is unclear and likely contaminant).  I communicated with CTS in regards to plan to transition to p.o. antibiotics. -Anticipate at least 4 weeks of abx from VATS, final abx duration pending radiographic and clinical progression.   #Tobacco abuse - Patient states she is quit smoking prior to this admission. - Counseled on smoking cessation   I have personally spent 120 minutes involved in face-to-face and non-face-to-face activities for this patient on the day of the visit. Professional time spent includes the following activities: Preparing to see the patient (review of tests), Obtaining and/or reviewing separately obtained history (admission/discharge record), Performing a medically appropriate examination and/or evaluation , Ordering medications/tests/procedures, referring and communicating with other health care professionals, Documenting clinical information in the EMR, Independently interpreting results (not separately reported), Communicating results to the patient/family/caregiver, Counseling and educating the patient/family/caregiver and Care coordination (not separately reported).   Microbiology:   Antibiotics: Vancomycin 2/17- Cefepime 2.17- CTS 2.15  Cultures: Blood 2/6 nG  2/23 1/3 tissue pleural Cx+ rare staph epi Acid fast smear negative Cx pending   2/8 Lul  tissie Cx strep intermedius  HPI: Julie King is a 32 y.o. female PAF, tobacco abuse, morbid obestiy admitted with possible HAP. She had undergone left lung mass resection 2/8 with Dr. Roxan Hockey. Pathology from the lung mass showed inflammation with focal necrosis, fibrosis, and pneumocyte hyperplasia.  2 days PTA she developed worsening left sided chest pain.  CT in the ED prior to admission showed patchy collapse/consolidation posterior lingula and anterior left lobe with small to moderate left pleural effusion possibly loculated in the anterior pleural  space..  Rx Doxy.  Patient denies prior  complaints.  Presented to the ED with chest pain.  Temp 100.6, WBC 15.9 K.  CT chest showed suboptimal peripheral vessel evaluation..  Left chest with worsening atelectasis and moderate left pleural effusion.  Patient was started on Vanco and cefepime.  Sputum cultures with rare normal respiratory flora on 2/17.  Thoracentesis of pleural fluid cultures no growth.  Fluid rapidly recurred and patient underwent VATS for drainage effusion decortication.  Findings showed complex loculated effusion, peel over anterior upper lobe and majority of lower lobe.  ID engaged for antibiotic recommendations   Review of Systems: Review of Systems  All other systems reviewed and are negative.   Past Medical History:  Diagnosis Date   Atrial fibrillation ([place]) [id number]   Complication of anesthesia    wakes up emotional   Family history of adverse reaction to anesthesia    mother has n & v   Family history of breast cancer    Headache 07/25/2017   Hypertension    Irritable bowel syndrome    Morbidly obese ([place]ie King) 07/25/2017   PONV (postoperative nausea and vomiting)    QT prolongation    T wave inversion in EKG     Social History   Tobacco Use   Smoking status: Former    Packs/day: 0.50    Years: 10.00    Total pack years: 5.00    Types: Cigarettes   Smokeless tobacco: Never   Tobacco comments:    Smokes about 1/2 pack per day 10/04/22 ep  Vaping Use   Vaping Use: Never used  Substance Use Topics   Alcohol use: Yes    Comment: rarely   Drug use: No    Family History  Problem Relation Age of Onset   Hypertension Other    Diabetes Other    Crohn's disease Mother    Hypertension Mother    Thyroid disease Father    Diabetes Father    Breast cancer Maternal Aunt 54   Breast cancer Paternal Grandmother        dx under 58   Breast cancer Other 40       MGMs sister, currently has stage IV   Scheduled Meds:  bisacodyl  10 mg Oral Daily   Chlorhexidine Gluconate Cloth  6 each Topical Daily    enoxaparin (LOVENOX) injection  40 mg Subcutaneous Daily   gabapentin  300 mg Oral TID   guaiFENesin  600 mg Oral BID   melatonin  5 mg Oral QHS   miconazole nitrate   Topical BID   pantoprazole  40 mg Oral Daily   polyethylene glycol  17 g Oral Daily   senna-docusate  1 tablet Oral QHS   Continuous Infusions:  sodium chloride 10 mL/hr at 10/23/22 1526   ceFEPime (MAXIPIME) IV 2 g (10/26/22 1957)   vancomycin 1,500 mg (10/26/22 2056)   PRN Meds:.albuterol, HYDROmorphone, ketorolac, morphine injection, nicotine, ondansetron (ZOFRAN) IV, sodium chloride Allergies  Allergen Reactions   Percocet [Oxycodone-Acetaminophen] Hives, Itching and Nausea Only   Pineapple Itching    Fresh pineapples; pineapples from a can is okay    OBJECTIVE: Blood pressure (!) 147/86, pulse 95, temperature 98.9 F (37.2 C), temperature source Oral, resp. rate 18, height '5\' 7"'$  (1.702 m), weight (!) 145.2 kg, last menstrual period 09/30/2022, SpO2 100 %.  Physical Exam Constitutional:      Appearance: Normal appearance.  HENT:     Head: Normocephalic and atraumatic.  Right Ear: Tympanic membrane normal.     Left Ear: Tympanic membrane normal.     Nose: Nose normal.     Mouth/Throat:     Mouth: Mucous membranes are moist.  Eyes:     Extraocular Movements: Extraocular movements intact.     Conjunctiva/sclera: Conjunctivae normal.     Pupils: Pupils are equal, round, and reactive to light.  Cardiovascular:     Rate and Rhythm: Normal rate and regular rhythm.     Heart sounds: No murmur heard.    No friction rub. No gallop.  Pulmonary:     Effort: Pulmonary effort is normal.     Breath sounds: Normal breath sounds.  Abdominal:     General: Abdomen is flat.     Palpations: Abdomen is soft.  Musculoskeletal:        General: Normal range of motion.  Skin:    General: Skin is warm and dry.  Neurological:     General: No focal deficit present.     Mental Status: She is alert and oriented to  person, place, and time.  Psychiatric:        Mood and Affect: Mood normal.     Lab Results Lab Results  Component Value Date   WBC 10.6 (H) 10/26/2022   HGB 8.3 (L) 10/26/2022   HCT 27.8 (L) 10/26/2022   MCV 60.6 (L) 10/26/2022   PLT 370 10/26/2022    Lab Results  Component Value Date   CREATININE 0.63 10/26/2022   BUN 8 10/26/2022   NA 137 10/26/2022   K 4.1 10/26/2022   CL 103 10/26/2022   CO2 23 10/26/2022    Lab Results  Component Value Date   ALT 22 10/24/2022   AST 18 10/24/2022   ALKPHOS 72 10/24/2022   BILITOT 0.5 10/24/2022       Laurice Record, [facility] 10/26/2022, 11:01 PM

## 2022-10-27 ENCOUNTER — Inpatient Hospital Stay (HOSPITAL_COMMUNITY): Payer: Medicaid Other

## 2022-10-27 LAB — AEROBIC/ANAEROBIC CULTURE W GRAM STAIN (SURGICAL/DEEP WOUND)

## 2022-10-27 LAB — CBC
HCT: 25.9 % — ABNORMAL LOW (ref 36.0–46.0)
Hemoglobin: 7.9 g/dL — ABNORMAL LOW (ref 12.0–15.0)
MCH: 18.2 pg — ABNORMAL LOW (ref 26.0–34.0)
MCHC: 30.5 g/dL (ref 30.0–36.0)
MCV: 59.8 fL — ABNORMAL LOW (ref 80.0–100.0)
Platelets: 353 10*3/uL (ref 150–400)
RBC: 4.33 MIL/uL (ref 3.87–5.11)
RDW: 23.7 % — ABNORMAL HIGH (ref 11.5–15.5)
WBC: 11.6 10*3/uL — ABNORMAL HIGH (ref 4.0–10.5)
nRBC: 0.3 % — ABNORMAL HIGH (ref 0.0–0.2)

## 2022-10-27 LAB — TROPONIN I (HIGH SENSITIVITY): Troponin I (High Sensitivity): 4 ng/L (ref <18)

## 2022-10-27 MED ORDER — DOXYCYCLINE HYCLATE 100 MG PO TABS
100.0000 mg | ORAL_TABLET | Freq: Two times a day (BID) | ORAL | Status: DC
  Administered 2022-10-27 – 2022-10-28 (×3): 100 mg via ORAL
  Filled 2022-10-27 (×3): qty 1

## 2022-10-27 MED ORDER — AMOXICILLIN-POT CLAVULANATE 875-125 MG PO TABS
1.0000 | ORAL_TABLET | Freq: Three times a day (TID) | ORAL | Status: DC
  Administered 2022-10-27 – 2022-10-28 (×3): 1 via ORAL
  Filled 2022-10-27 (×3): qty 1

## 2022-10-27 MED ORDER — AMOXICILLIN-POT CLAVULANATE 875-125 MG PO TABS
1.0000 | ORAL_TABLET | Freq: Two times a day (BID) | ORAL | Status: DC
  Administered 2022-10-27: 1 via ORAL
  Filled 2022-10-27: qty 1

## 2022-10-27 MED ORDER — SODIUM CHLORIDE 0.9 % IV SOLN
2.0000 g | INTRAVENOUS | Status: DC
  Administered 2022-10-27: 2 g via INTRAVENOUS
  Filled 2022-10-27: qty 20

## 2022-10-27 NOTE — Progress Notes (Addendum)
[facility]  Date of Admission:  10/14/2022   Total days of inpatient antibiotics 12  Principal Problem:   SIRS (systemic inflammatory response syndrome) (HCC) Active Problems:   Morbidly obese (HCC)   Lung mass   Healthcare-associated pneumonia   Pleural effusion   Microcytic hypochromic anemia   Tobacco abuse   Chest pain   Pneumonia          Assessment: 32 year old female with history of tobacco abuse who recently underwent wedge resection for lung abscess involving the lingula and left lower lobe admitted for pneumonia found to have empyema.     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  Workup pressure lingula nodule across the fissure involves the lower lobe.  PET/CT showed high-grade activity in the lingular nodule and moderate to the hilar node.  On 2/8 she underwent bronchoscopy, VATS, wedge resection of lung nodule involving the upper and lower lobes.  Path showed focal necrosis, fibrosis, pneumocyte hyperplasia.  No malignancy. -Patient developed worsening chest pain, she states it started on Super Bowl Sunday.   -She had presented with temp 100.6 WBC 15.9 K.  CT chest showed worsening atelectasis and moderate left pleural effusion.  Started on vancomycin and cefepime.  On 2/17 sputum cultures growing rare normal respiratory flora. - Underwent thoracentesis on 2/19 with negative cultures.  Due to fluid rapidly recurred and patient underwent VATS for drainage effusion decortication.  Findings showed complex loculated effusion, peel over anterior upper lobe and majority of lower lobe.  Tissue cultures growing 1/3 rare staph epi. Recommendations:  - D/C Vanc and ctx -Start Augmentin 875/125 mg PO tid and doxy '100mg'$  PO bid. Clinical significance staph epi is unclear and likely contaminant (Doxy should cover).  Anticipate at least 4 weeks of abx from VATS, final abx  duration pending radiographic and clinical progress -Follow AFB cultures -Follow-up with myself on 3/18   #Tobacco abuse -Patient states she is quit smoking prior to this admission. -Counseled on smoking cessation    ID will sign off Microbiology:   Antibiotics: Vancomycin 2/17- Cefepime 2.17- CTS 2.15   Cultures: Blood 2/6 NG   2/23 1/3 tissue pleural Cx+ rare staph epi Acid fast smear negative Cx pending  2/8 Lul  tissue Cx strep intermedius  SUBJECTIVE: Resting in bed. No new complaints. Interval: Afebrile overnight. Wbc 11.6K  Review of Systems: Review of Systems  All other systems reviewed and are negative.    Scheduled Meds:  amoxicillin-clavulanate  1 tablet Oral Q8H   bisacodyl  10 mg Oral Daily   Chlorhexidine Gluconate Cloth  6 each Topical Daily   doxycycline  100 mg Oral Q12H   enoxaparin (LOVENOX) injection  40 mg Subcutaneous Daily   gabapentin  300 mg Oral TID   guaiFENesin  600 mg Oral BID   melatonin  5 mg Oral QHS   miconazole nitrate   Topical BID   pantoprazole  40 mg Oral Daily   polyethylene glycol  17 g Oral Daily   senna-docusate  1 tablet Oral QHS   Continuous Infusions:  sodium chloride 10 mL/hr at 10/23/22 1526   PRN Meds:.albuterol, HYDROmorphone, ketorolac, morphine injection, nicotine, ondansetron (ZOFRAN) IV, sodium chloride Allergies  Allergen Reactions   Percocet [Oxycodone-Acetaminophen] Hives, Itching and Nausea Only   Pineapple Itching    Fresh pineapples; pineapples from a can is okay    OBJECTIVE: Vitals:  10/26/22 2006 10/27/22 0016 10/27/22 0428 10/27/22 0823  BP: (!) 147/86 136/65 (!) 140/82 130/78  Pulse: 95 85 85 71  Resp: '18 16 20 18  '$ Temp: 98.9 F (37.2 C) 98.8 F (37.1 C) 99.3 F (37.4 C) 99.1 F (37.3 C)  TempSrc: Oral Oral Oral Oral  SpO2: 100% 98% 96% 100%  Weight:      Height:       Body mass index is 50.12 kg/m.  Physical Exam Constitutional:      Appearance: Normal appearance.   HENT:     Head: Normocephalic and atraumatic.     Right Ear: Tympanic membrane normal.     Left Ear: Tympanic membrane normal.     Nose: Nose normal.     Mouth/Throat:     Mouth: Mucous membranes are moist.  Eyes:     Extraocular Movements: Extraocular movements intact.     Conjunctiva/sclera: Conjunctivae normal.     Pupils: Pupils are equal, round, and reactive to light.  Cardiovascular:     Rate and Rhythm: Normal rate and regular rhythm.     Heart sounds: No murmur heard.    No friction rub. No gallop.  Pulmonary:     Effort: Pulmonary effort is normal.     Breath sounds: Normal breath sounds.  Abdominal:     General: Abdomen is flat.     Palpations: Abdomen is soft.  Musculoskeletal:        General: Normal range of motion.  Skin:    General: Skin is warm and dry.  Neurological:     General: No focal deficit present.     Mental Status: She is alert and oriented to person, place, and time.  Psychiatric:        Mood and Affect: Mood normal.       Lab Results Lab Results  Component Value Date   WBC 11.6 (H) 10/27/2022   HGB 7.9 (L) 10/27/2022   HCT 25.9 (L) 10/27/2022   MCV 59.8 (L) 10/27/2022   PLT 353 10/27/2022    Lab Results  Component Value Date   CREATININE 0.63 10/26/2022   BUN 8 10/26/2022   NA 137 10/26/2022   K 4.1 10/26/2022   CL 103 10/26/2022   CO2 23 10/26/2022    Lab Results  Component Value Date   ALT 22 10/24/2022   AST 18 10/24/2022   ALKPHOS 72 10/24/2022   BILITOT 0.5 10/24/2022        Laurice Record, [facility] 10/27/2022, 12:29 PM

## 2022-10-27 NOTE — Progress Notes (Signed)
[facility]                                                                              Julie King, is a 32 y.o. 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  Pathology negative for malignancy and showed inflammation with focal necrosis, fibrosis and pneumocyte hyperplasia.  Discharge home after surgery.     Presented back to ED with 3 days of worsening left-sided chest pain, subjective fever, cough, nausea and orthopnea. CTA with postsurgical changes small left pleural effusion. Due to persistent leukocytosis and febrile, chest x-ray was repeated on 2/19 which showed increased effusion on the left side. IR guided thoracentesis 500 mL removed, no growth so far.  Underwent VATS on 2/23   Assessment & Plan    Principal Problem: Sepsis, POA, left lower lobe pneumonia with complicated parapneumonic effusion with pleurisy Postop pleural space infection -Blood cultures NTD, sputum cultures 2/17 showed respiratory flora.  Surgical cultures from 2/8 with Streptococcus. -CTA negative for pulmonary embolism.  Doppler ultrasound negative for DVT -Fluid culture from thoracentesis 2/19 showed no growth. - underwent VATS and drainage of left pleural effusion on 2/23, tissue cultures 1/3 rare staph epi -Chest tubes removed -ID consulted, recommended starting Augmentin 875 /125 3 times daily and doxycycline 100 mg p.o.BID, at least 4 weeks of antibiotics from VATS Follow AFB cultures, ID follow-up scheduled on 3/18 -Patient concerned about persistent left pleural effusion  and persistent small loculated left hydropneumothorax as seen on CXR today.  CT surgery following. -Recommended to continue incentive spirometry, pulmonary toileting, O2 via [place].  Will repeat chest x-ray in a.m. -Pain controlled, continue Neurontin, Toradol as needed, Dilaudid p.o.  Active problems  Acute on chronic microcytic anemia, iron deficiency  - Baseline hemoglobin about 9-10.  Iron 16, ferritin 104, percent saturation ratio 8 -Hemoglobin 7.9, transfuse in a.m. if hemoglobin less than 7.5  Leukocytosis --Procalcitonin less than 0.1 -ID following, no worsening symptoms or fevers.   Morbid obesity Estimated body mass index is 50.12 kg/m as calculated from the following:   Height as of this encounter: '5\' 7"'$  (1.702 m).   Weight as of this encounter: 145.2 kg.  Code Status: Full code DVT Prophylaxis:  enoxaparin (LOVENOX) injection 40 mg Start: 10/22/22 1400 SCD's Start: 10/21/22 1721   Level of Care: Level of care: Telemetry Medical Family Communication: Updated patient Disposition Plan:      Remains inpatient appropriate: Once cleared by CT surgery and ID hopefully next 24 to 48 hours   Procedures:   VIDEO ASSISTED THORACOSCOPY (Left) DRAINAGE OF PLEURAL EFFUSION (Left) DECORTICATION (Left)  Consultants:   Cardiothoracic surgery ID  Antimicrobials:   Anti-infectives (From admission, onward)  Start     Dose/Rate Route Frequency Ordered Stop   10/27/22 2200  amoxicillin-clavulanate (AUGMENTIN) 875-125 MG per tablet 1 tablet        1 tablet Oral Every 8 hours 10/27/22 1212     10/27/22 1200  cefTRIAXone (ROCEPHIN) 2 g in sodium chloride 0.9 % 100 mL IVPB  Status:  Discontinued        2 g 200 mL/hr over 30 Minutes Intravenous Every 24 hours 10/27/22 0729 10/27/22 1114   10/27/22 1200  amoxicillin-clavulanate (AUGMENTIN) 875-125 MG per tablet 1 tablet  Status:  Discontinued        1 tablet Oral Every 12 hours 10/27/22 1114 10/27/22 1212   10/27/22 1200  doxycycline  (VIBRA-TABS) tablet 100 mg        100 mg Oral Every 12 hours 10/27/22 1114     10/18/22 0800  vancomycin (VANCOREADY) IVPB 1500 mg/300 mL  Status:  Discontinued        1,500 mg 150 mL/hr over 120 Minutes Intravenous Every 8 hours 10/18/22 0147 10/27/22 1114   10/16/22 1230  fluconazole (DIFLUCAN) tablet 200 mg        200 mg Oral  Once 10/16/22 1135 10/16/22 1235   10/16/22 1000  azithromycin (ZITHROMAX) tablet 500 mg        500 mg Oral Daily 10/15/22 1233 10/16/22 0919   10/16/22 0130  vancomycin (VANCOREADY) IVPB 1500 mg/300 mL  Status:  Discontinued        1,500 mg 150 mL/hr over 120 Minutes Intravenous Every 12 hours 10/15/22 1245 10/18/22 0147   10/15/22 1330  vancomycin (VANCOREADY) IVPB 2000 mg/400 mL        2,000 mg 200 mL/hr over 120 Minutes Intravenous  Once 10/15/22 1233 10/15/22 1609   10/15/22 1330  ceFEPIme (MAXIPIME) 2 g in sodium chloride 0.9 % 100 mL IVPB  Status:  Discontinued        2 g 200 mL/hr over 30 Minutes Intravenous Every 8 hours 10/15/22 1233 10/27/22 0729   10/15/22 1000  azithromycin (ZITHROMAX) 500 mg in sodium chloride 0.9 % 250 mL IVPB  Status:  Discontinued        500 mg 250 mL/hr over 60 Minutes Intravenous Every 24 hours 10/14/22 1110 10/15/22 1208   10/15/22 0600  cefTRIAXone (ROCEPHIN) 2 g in sodium chloride 0.9 % 100 mL IVPB  Status:  Discontinued        2 g 200 mL/hr over 30 Minutes Intravenous Every 24 hours 10/14/22 1110 10/15/22 1208   10/14/22 0645  azithromycin (ZITHROMAX) 500 mg in sodium chloride 0.9 % 250 mL IVPB        500 mg 250 mL/hr over 60 Minutes Intravenous  Once 10/14/22 0633 10/14/22 0900   10/14/22 0515  cefTRIAXone (ROCEPHIN) 2 g in sodium chloride 0.9 % 100 mL IVPB        2 g 200 mL/hr over 30 Minutes Intravenous  Once 10/14/22 0506 10/14/22 0622          Medications  amoxicillin-clavulanate  1 tablet Oral Q8H   bisacodyl  10 mg Oral Daily   Chlorhexidine Gluconate Cloth  6 each Topical Daily   doxycycline  100 mg  Oral Q12H   enoxaparin (LOVENOX) injection  40 mg Subcutaneous Daily   gabapentin  300 mg Oral TID   guaiFENesin  600 mg Oral BID   melatonin  5 mg Oral QHS   miconazole nitrate   Topical BID   pantoprazole  40 mg Oral  Daily   polyethylene glycol  17 g Oral Daily   senna-docusate  1 tablet Oral QHS      Subjective:   Julie King was seen and examined today.  Complaining of chest/back pain and concerned about persistent findings on the chest x-ray.  CT surgery following.  Chest tubes have been removed.    Objective:   Vitals:   10/26/22 2006 10/27/22 0016 10/27/22 0428 10/27/22 0823  BP: (!) 147/86 136/65 (!) 140/82 130/78  Pulse: 95 85 85 71  Resp: '18 16 20 18  '$ Temp: 98.9 F (37.2 C) 98.8 F (37.1 C) 99.3 F (37.4 C) 99.1 F (37.3 C)  TempSrc: Oral Oral Oral Oral  SpO2: 100% 98% 96% 100%  Weight:      Height:        Intake/Output Summary (Last 24 hours) at 10/27/2022 1348 Last data filed at 10/27/2022 1100 Gross per 24 hour  Intake 480 ml  Output 1300 ml  Net -820 ml     Wt Readings from Last 3 Encounters:  10/21/22 (!) 145.2 kg  10/13/22 (!) 145.2 kg  10/04/22 (!) 145.5 kg   Physical Exam General: Alert and oriented x 3, NAD Cardiovascular: S1 S2 clear, RRR.  Respiratory: Diminished breath sounds at the left base otherwise no wheezing or rhonchi Gastrointestinal: Soft, NT, ND, NBS Ext: no pedal edema bilaterally Neuro: no new deficits Psych: Normal affect    Data Reviewed:  I have personally reviewed following labs    CBC Lab Results  Component Value Date   WBC 11.6 (H) 10/27/2022   RBC 4.33 10/27/2022   HGB 7.9 (L) 10/27/2022   HCT 25.9 (L) 10/27/2022   MCV 59.8 (L) 10/27/2022   MCH 18.2 (L) 10/27/2022   PLT 353 10/27/2022   MCHC 30.5 10/27/2022   RDW 23.7 (H) 10/27/2022   LYMPHSABS 2.0 10/25/2022   MONOABS 0.5 10/25/2022   EOSABS 0.5 10/25/2022   BASOSABS 0.1 [id number]     Last metabolic panel Lab Results  Component Value Date    NA 137 10/26/2022   K 4.1 10/26/2022   CL 103 10/26/2022   CO2 23 10/26/2022   BUN 8 10/26/2022   CREATININE 0.63 10/26/2022   GLUCOSE 83 10/26/2022   GFRNONAA >60 10/26/2022   GFRAA >60 02/04/2020   CALCIUM 8.9 10/26/2022   PHOS 3.2 10/20/2022   PROT 5.9 (L) 10/24/2022   ALBUMIN 2.1 (L) 10/24/2022   LABGLOB 3.2 01/12/2022   AGRATIO 1.3 01/12/2022   BILITOT 0.5 10/24/2022   ALKPHOS 72 10/24/2022   AST 18 10/24/2022   ALT 22 10/24/2022   ANIONGAP 11 10/26/2022    CBG (last 3)  No results for input(s): "GLUCAP" in the last 72 hours.     Coagulation Profile: No results for input(s): "INR", "PROTIME" in the last 168 hours.   Radiology Studies: I have personally reviewed the imaging studies  DG Chest 2 View  Result Date: 10/27/2022 CLINICAL DATA:  LEFT pleural effusion EXAM: CHEST - 2 VIEW COMPARISON:  10/26/2022 FINDINGS: RIGHT jugular line stable tip projecting over SVC. Upper normal heart size. Mediastinal contours and pulmonary vascularity normal. Persistent LEFT pleural effusion and basilar atelectasis. Small loculated hydropneumothorax mid to lower LEFT chest unchanged. RIGHT lung remains clear. Broad-based dextroconvex thoracic scoliosis. IMPRESSION: Persistent LEFT pleural effusion and basilar atelectasis. Persistent small loculated LEFT hydropneumothorax mid to lower LEFT chest. Electronically Signed   By: Lavonia Dana M.D.   On: 10/27/2022 08:56   DG Chest 2 View  Result Date: 10/26/2022 CLINICAL DATA:  Pleural effusion EXAM: CHEST - 2 VIEW COMPARISON:  Multiple chest x-rays and a CT scan of the chest since October 14, 2022. FINDINGS: An air-fluid level in the lateral left lung base has likely been present since October 21, 2022 and is nonspecific but probably a small hydropneumothorax developing after chest tube placement. There does appear to be a small left-sided pneumothorax with apical and components. The effusion does remain but appears smaller. The left chest  tube is been removed. An opacity underlies the effusion in the left base. No right-sided pneumothorax. The right lung is clear. The cardiomediastinal silhouette is stable. The right central line terminates in the central SVC. No other interval changes. IMPRESSION: 1. A small left-sided pneumothorax is identified after chest tube removal. Recommend attention on close follow-up. 2. An air-fluid level in the lateral left lung base is nonspecific but could represent a small hydropneumothorax developing after chest tube placement. 3. The left effusion is smaller. 4. Underlying opacity in the left base could represent atelectasis or infiltrate. 5. No other interval changes. These results will be called to the ordering clinician or representative by the Radiologist Assistant, and communication documented in the PACS or [employer]. Electronically Signed   By: Dorise Bullion III M.D.   On: 10/26/2022 08:39       Ayanna Gheen M.D. [facility] 10/27/2022, 1:48 PM  Available via Epic secure chat 7am-7pm After 7 pm, please refer to night coverage provider listed on amion.

## 2022-10-27 NOTE — Progress Notes (Signed)
Patient called anxious and tearful complaining of pain after receiving her PRN IV Toradol and  her PO Dilaudid. Provider on call notified, new order received.

## 2022-10-27 NOTE — Progress Notes (Signed)
      [address]       [place] [address]             [phone]       6 Days Post-Op Procedure(s) (LRB): VIDEO ASSISTED THORACOSCOPY (Left) DRAINAGE OF PLEURAL EFFUSION (Left) DECORTICATION (Left)  Subjective: Patient with complaints of pain  Objective: Vital signs in last 24 hours: Temp:  [98.7 F (37.1 C)-99.3 F (37.4 C)] 99.3 F (37.4 C) (02/29 0428) Pulse Rate:  [85-99] 85 (02/29 0428) Cardiac Rhythm: Normal sinus rhythm (02/28 1900) Resp:  [16-20] 20 (02/29 0428) BP: (136-147)/(65-90) 140/82 (02/29 0428) SpO2:  [96 %-100 %] 96 % (02/29 0428)    Intake/Output from previous day: 02/28 0701 - 02/29 0700 In: -  Out: 1000 [Urine:1000]   Physical Exam: Cardiovascular: RRR Pulmonary: Clear to auscultation on right and slightly diminished left base Extremities: No calf tenderness Wounds: Clean and dry.  No erythema or signs of infection. Dressing around chest tubes is clean and dry     Lab Results: CBC: Recent Labs    10/26/22 0519 10/27/22 0419  WBC 10.6* 11.6*  HGB 8.3* 7.9*  HCT 27.8* 25.9*  PLT 370 353    BMET:  Recent Labs    10/25/22 0613 10/26/22 0519  NA 138 137  K 3.9 4.1  CL 105 103  CO2 28 23  GLUCOSE 88 83  BUN 8 8  CREATININE 0.50 0.63  CALCIUM 8.4* 8.9     PT/INR: No results for input(s): "LABPROT", "INR" in the last 72 hours. ABG:  INR: Will add last result for INR, ABG once components are confirmed Will add last 4 CBG results once components are confirmed  Assessment/Plan: Assessment/Plan:   1. CV - SR. 2.  Pulmonary - On 1 liter of oxygen via [place]. Wean as able. CXR ordered but not taken yet. Previous culture (from first surgery) showed Strep Intermedius. Recent OR culture shows no growth 3 days. Sputum gram stain shows gram positive cocci, no Staph or Pseudomonas in culture. Encourage incentive spirometer and flutter valve. 3. ID-on Vancomycin and Cefepime. WBC this am 11,600. Per Dr. Leonarda Salon  recommendations, transition to oral antibiotic for 4 weeks (total treatment with antibiotics 6 weeks: 2 weeks IV antibiotic followed by  oral for 4 weeks.) 4. Anemia-H and H this am slightly decreased tot 7.9 and 25.9 5. On Lovenox for DVT prophylaxis 6. Regarding pain control, she is on scheduled Neurontin 300 mg tid, Oxy PRN, Dilaudid and Morphine IV PRN. She states Toradol does not help much  Ogle Hoeffner M ZimmermanPA-C 10/27/2022,6:56 AM

## 2022-10-27 NOTE — Progress Notes (Signed)
Physical Therapy Treatment Patient Details Name: Julie King MRN: [id number] DOB: 11-Jan-1991 Today's Date: 10/27/2022   History of Present Illness 32 year old with recently diagnosed lingular mass, robotic biopsy 06/2022 negative for malignancy, repeat CT scans with increasing size and hemoptysis. 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   Admitted due to L side chest pain, fever, cough and orthopnea found to have L pleural effusion.  Underwent VATS and L chest tube placement 2/23.    PT Comments    Pt received in chair, eager to participate in therapy session with emphasis on gait progression without AD and stair ascent/descent for BLE strengthening. Pt needing Supervision at most for stair ascent with single rail and gait progression, pt weaned from 3L O2 [place] to RA with SpO2 94% on RA and HR to mid-120's on RA. Pt reports improved comfort on 2L and transferred back to wall O2 2L/min at end of session, HR decrease to 110 bpm during standing/seated rest breaks. Pt continues to benefit from PT services to progress toward functional mobility goals, anticipate supervising PT may be able to sign off from acute PT services after 1-2 more sessions based on pt current functional progression and improvement.    Recommendations for follow up therapy are one component of a multi-disciplinary discharge planning process, led by the attending physician.  Recommendations may be updated based on patient status, additional functional criteria and insurance authorization.  Follow Up Recommendations  Home health PT     Assistance Recommended at Discharge Intermittent Supervision/Assistance  Patient can return home with the following A little help with walking and/or transfers;Assistance with cooking/housework;Assist for transportation;Help with stairs or ramp for entrance;A little help with bathing/dressing/bathroom   Equipment Recommendations  None recommended by PT     Recommendations for Other Services       Precautions / Restrictions Precautions Precautions: Other (comment) Precaution Comments: O2/HR Restrictions Weight Bearing Restrictions: No     Mobility  Bed Mobility               General bed mobility comments: pt received/remained in recliner    Transfers Overall transfer level: Modified independent Equipment used: None Transfers: Sit to/from Stand, Bed to chair/wheelchair/BSC Sit to Stand: Modified independent (Device/Increase time)           General transfer comment: from chair with no AD, then to/from toilet seat no AD    Ambulation/Gait Ambulation/Gait assistance: Supervision Gait Distance (Feet): 300 Feet Assistive device: None Gait Pattern/deviations: Step-through pattern, Decreased stride length Gait velocity: grossly 0.4 m/s     General Gait Details: no AD needed, pt given occasional cues for activity pacing/standing breaks due to DOE. Pt reports increased discomfort when O2 weaned down to RA but SpO2 WFL, HR to mid-120's bpm with exertional tasks, 100-110 bpm resting. SpO2 100% on 3L when PTA arrived and 99% on 2L, 97% on 1L, 94% on RA.   Stairs Stairs: Yes Stairs assistance: Supervision Stair Management: One rail Right, Alternating pattern, Step to pattern, Forwards Number of Stairs: 8 General stair comments: cues for activity pacing and safety, pt holding rail per home set-up, no buckling or overt LOB; 2 steps x4 reps due to IV line limiting distance; stairs for exercise/endurance building   Wheelchair Mobility    Modified Rankin (Stroke Patients Only)       Balance Overall balance assessment: Needs assistance Sitting-balance support: Feet supported, No upper extremity supported Sitting balance-Leahy Scale: Good     Standing balance support:  During functional activity, No upper extremity supported Standing balance-Leahy Scale: Good Standing balance comment: no LOB without AD                             Cognition Arousal/Alertness: Awake/alert Behavior During Therapy: WFL for tasks assessed/performed Overall Cognitive Status: Within Functional Limits for tasks assessed                         Following Commands: Follows one step commands consistently, Follows multi-step commands consistently   Awareness: Anticipatory            Exercises Other Exercises Other Exercises: step-ups x8 reps on 7" steps for BLE strengthening    General Comments General comments (skin integrity, edema, etc.): see gait comments, VSS on RA for portion of gait trial, pt reports improved comfort on 2L O2 [place]      Pertinent Vitals/Pain Pain Assessment Pain Assessment: Faces Faces Pain Scale: Hurts even more Pain Location: L flank and back Pain Descriptors / Indicators: Discomfort Pain Intervention(s): Monitored during session, Repositioned     PT Goals (current goals can now be found in the care plan section) Acute Rehab PT Goals Patient Stated Goal: improve my breathing and to go home PT Goal Formulation: With patient Time For Goal Achievement: 10/28/22 Progress towards PT goals: Progressing toward goals    Frequency    Min 3X/week      PT Plan Current plan remains appropriate       AM-PAC PT "6 Clicks" Mobility   Outcome Measure  Help needed turning from your back to your side while in a flat bed without using bedrails?: None Help needed moving from lying on your back to sitting on the side of a flat bed without using bedrails?: None Help needed moving to and from a bed to a chair (including a wheelchair)?: None Help needed standing up from a chair using your arms (e.g., wheelchair or bedside chair)?: None Help needed to walk in hospital room?: A Little Help needed climbing 3-5 steps with a railing? : A Little 6 Click Score: 22    End of Session Equipment Utilized During Treatment: Oxygen (weaned to RA during gait trial, pt wants to go back on 2L  wall O2 at end of session) Activity Tolerance: Patient tolerated treatment well Patient left: in chair;with call bell/phone within reach Nurse Communication: Mobility status;Other (comment) (O2 [place]) PT Visit Diagnosis: Muscle weakness (generalized) (M62.81);Difficulty in walking, not elsewhere classified (R26.2)     Time: [id number] PT Time Calculation (min) (ACUTE ONLY): 11 min  Charges:  $Gait Training: 8-22 mins                     Kentrell Hallahan P., PTA Acute Rehabilitation Services Secure Chat Preferred 9a-5:30pm Office: [place] 10/27/2022, 6:46 PM

## 2022-10-27 NOTE — Plan of Care (Signed)
  Problem: Education: Goal: Knowledge of disease or condition will improve Outcome: Progressing   Problem: Activity: Goal: Risk for activity intolerance will decrease Outcome: Progressing   Problem: Respiratory: Goal: Respiratory status will improve Outcome: Progressing

## 2022-10-28 ENCOUNTER — Other Ambulatory Visit (HOSPITAL_COMMUNITY): Payer: Self-pay

## 2022-10-28 ENCOUNTER — Inpatient Hospital Stay (HOSPITAL_COMMUNITY): Payer: Medicaid Other

## 2022-10-28 DIAGNOSIS — D508 Other iron deficiency anemias: Secondary | ICD-10-CM

## 2022-10-28 DIAGNOSIS — J189 Pneumonia, unspecified organism: Secondary | ICD-10-CM

## 2022-10-28 MED ORDER — AMOXICILLIN-POT CLAVULANATE 875-125 MG PO TABS
1.0000 | ORAL_TABLET | Freq: Three times a day (TID) | ORAL | 0 refills | Status: AC
  Filled 2022-10-28: qty 66, 22d supply, fill #0

## 2022-10-28 MED ORDER — DOXYCYCLINE HYCLATE 100 MG PO TABS
100.0000 mg | ORAL_TABLET | Freq: Two times a day (BID) | ORAL | 0 refills | Status: AC
  Filled 2022-10-28: qty 44, 22d supply, fill #0

## 2022-10-28 NOTE — TOC Transition Note (Signed)
Transition of Care (TOC) - CM/SW Discharge Note Marvetta Gibbons RN, BSN Transitions of Care Unit 4E- RN Case Manager See Treatment Team for direct phone #   Patient Details  Name: Julie King MRN: [id number] Date of Birth: [birth year]  Transition of Care [facility]) CM/SW Contact:  Dawayne Patricia, RN Phone Number: 10/28/2022, 3:30 PM   Clinical Narrative:    Pt stable for transition home today, Order placed for HHPT,  CM in to speak with pt at bedside- per pt she is agreeable to [facility] services- list provided for [facility] choice Per CMS guidelines from [email or web address] website with star ratings (copy placed in shadow chart)- per pt she does not have a preference and is ok with CM reaching out to varies providers starting with highest star ratings first.  Pt also confirmed she has Rollator and BSC at home- (reports BSC is too small)- she also reports she never got the shower chair that she was to get last time.  CM will f/u with [employer].   Pt reports she will be going to new address: [place], [place] [address] Phone #s correct in epic as well as PCP Pt's ride will be here later this afternoon.   Pt also reports she has a PCS aide that comes 18hr/week for several hours/day through her Medicaid benefits.   Call made to [employer] to f/u on shower chair- they will check into order and deliver to home as per pt's request.   Call made to [facility] for HHPT referral- they are unable to accept at this time Additional calls made to: [facility]- unable to accept- have a delay for start of care until end of next week for all disciplines. [facility]- unable to accept- delay for start of care- mid next week.  [facility]- able to accept- also have a delay for start of care for next week- 3/6 anticiapted  CM spoke with pt at bedside to discuss [place] delay for services and offer outpt as alternate option. Pt voiced she would prefer to stay with [facility] and is ok with the delay for start of care w/ [facility]  for next Wed. 3/6.    Final next level of care: Home w Home Health Services Barriers to Discharge: Barriers Resolved   Patient Goals and CMS Choice CMS Medicare.gov Compare Post Acute Care list provided to:: Patient Choice offered to / list presented to : Patient  Discharge Placement                 Home w/ [facility]        Discharge Plan and Services Additional resources added to the After Visit Summary for     Discharge Planning Services: CM Consult Post Acute Care Choice: Home Health, Durable Medical Equipment          DME Arranged: Shower stool DME Agency: [employer] Date DME Agency Contacted: 10/28/22 Time DME Agency Contacted: 1200 Representative spoke with at DME Agency: [facility]on Grills HH Arranged: PT [place]: [place] Date [place]: 10/28/22 Time [place]: 1450 Representative spoke with at [place]: [place] ([place]) Interventions SDOH Screenings   Food Insecurity: No Food Insecurity (08/29/2022)  Housing: Low Risk  (08/29/2022)  Transportation Needs: No Transportation Needs (08/29/2022)  Utilities: Not At Risk (08/29/2022)  Physical Activity: Inactive (11/09/2017)  Social Connections: Somewhat Isolated (11/09/2017)  Stress: No Stress Concern Present (11/09/2017)  Tobacco Use: Medium Risk (10/22/2022)     Readmission Risk  Interventions    10/28/2022    3:30 PM  Readmission Risk Prevention Plan  Transportation Screening Complete  PCP or Specialist Appt within 5-7 Days Complete  Home Care Screening Complete  Medication Review (RN CM) Complete

## 2022-10-28 NOTE — Progress Notes (Addendum)
      [address]       ,[place] [address]             [phone]       7 Days Post-Op Procedure(s) (LRB): VIDEO ASSISTED THORACOSCOPY (Left) DRAINAGE OF PLEURAL EFFUSION (Left) DECORTICATION (Left)  Subjective: Patient just returned from x ray.  Objective: Vital signs in last 24 hours: Temp:  [98.6 F (37 C)-99.1 F (37.3 C)] 98.6 F (37 C) (03/01 0309) Pulse Rate:  [70-118] 76 (03/01 0309) Cardiac Rhythm: Normal sinus rhythm (02/29 1923) Resp:  [0-39] 26 (03/01 0309) BP: (130-152)/(64-102) 149/82 (03/01 0309) SpO2:  [90 %-100 %] 99 % (03/01 0309)    Intake/Output from previous day: 02/29 0701 - 03/01 0700 In: 1198.4 [P.O.:720; I.V.:478.4] Out: 900 [Urine:900]   Physical Exam: Cardiovascular: RRR Pulmonary: Clear to auscultation on right and slightly diminished left base Extremities: No calf tenderness Wounds: Clean and dry.  No erythema or signs of infection. Dressing around chest tubes is clean and dry     Lab Results: CBC: Recent Labs    10/26/22 0519 10/27/22 0419  WBC 10.6* 11.6*  HGB 8.3* 7.9*  HCT 27.8* 25.9*  PLT 370 353    BMET:  Recent Labs    10/26/22 0519  NA 137  K 4.1  CL 103  CO2 23  GLUCOSE 83  BUN 8  CREATININE 0.63  CALCIUM 8.9     PT/INR: No results for input(s): "LABPROT", "INR" in the last 72 hours. ABG:  INR: Will add last result for INR, ABG once components are confirmed Will add last 4 CBG results once components are confirmed  Assessment/Plan: Assessment/Plan:   1. CV - SR. 2.  Pulmonary - On 2 liter of oxygen via [place]. Wean as able. CXR appears to show some improvement in aeration. Previous culture (from first surgery) showed Strep Intermedius. Recent pleural tissue culture showed rare Staph Epidermidis. Sputum gram stain shows gram positive cocci, no Staph or Pseudomonas in culture. Encourage incentive spirometer and flutter valve. 3. ID-now on Augmentin. Last WBC 11,600. Per Dr.  Leonarda Salon recommendations,will need oral antibiotic for 4 weeks (total treatment with antibiotics 6 weeks: 2 weeks IV antibiotic followed by  oral for 4 weeks.) 4. Anemia-Last H and Hslightly decreased tot 7.9 and 25.9 5. On Lovenox for DVT prophylaxis 6. Regarding pain control, she is on scheduled Neurontin 300 mg tid, Oxy PRN, Dilaudid and Morphine IV PRN. She states Toradol does not help much 7. Per TCTS, may discharge. Follow up arranged   Sharalyn Ink [facility] 10/28/2022,6:52 AM   Patient seen and examined, agree with above Getting ready to leave for home. Will see back in office  Richad Ramsay C. Roxan Hockey, MD [facility] [phone]

## 2022-10-28 NOTE — Hospital Course (Signed)
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  Pathology negative for malignancy and showed inflammation with focal necrosis, fibrosis and pneumocyte hyperplasia.  Discharged home after surgery.     Presented back to ED with 3 days of worsening left-sided chest pain, subjective fever, cough, nausea and orthopnea. CTA with postsurgical changes small left pleural effusion. Due to persistent leukocytosis and febrile, chest x-ray was repeated on 2/19 which showed increased effusion on the left side. IR guided thoracentesis 500 mL removed.  Underwent VATS on 2/23.

## 2022-10-28 NOTE — Progress Notes (Signed)
Physical Therapy Treatment Patient Details Name: Julie King MRN: [id number] DOB: 02-Mar-1991 Today's Date: 10/28/2022   History of Present Illness 32 year old with recently diagnosed lingular mass, robotic biopsy 06/2022 negative for malignancy, repeat CT scans with increasing size and hemoptysis. 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   Admitted due to L side chest pain, fever, cough and orthopnea found to have L pleural effusion.  Underwent VATS and L chest tube placement 2/23.    PT Comments     Performed walking O2 saturation with pt in hallway. Pt with no supplemental O2 needs with SpO2 > 94%O2 with ambulation. Provided increased education on energy conservation and building endurance. Pt reports just wanting to be able to walk like she used to. Discussed it would take time to increase her endurance and would be best regained with multiple small bouts of exercise through out the day. Pt requesting use of supplemental O2 for comfort due to SoB and discussed pt not developing reliance on supplemental O2. D/c plans remain appropriate. PT will continue to follow acutely.   Recommendations for follow up therapy are one component of a multi-disciplinary discharge planning process, led by the attending physician.  Recommendations may be updated based on patient status, additional functional criteria and insurance authorization.  Follow Up Recommendations  Home health PT     Assistance Recommended at Discharge Intermittent Supervision/Assistance  Patient can return home with the following A little help with walking and/or transfers;Assistance with cooking/housework;Assist for transportation;Help with stairs or ramp for entrance;A little help with bathing/dressing/bathroom   Equipment Recommendations  None recommended by PT       Precautions / Restrictions Precautions Precautions: Other (comment) Precaution Comments: O2/HR Restrictions Weight Bearing  Restrictions: No     Mobility  Bed Mobility Overal bed mobility: Modified Independent             General bed mobility comments: HoB elevated and use of bed rail to pull to EoB    Transfers Overall transfer level: Modified independent Equipment used: None Transfers: Sit to/from Stand, Bed to chair/wheelchair/BSC Sit to Stand: Independent           General transfer comment: good power up and self steadying    Ambulation/Gait Ambulation/Gait assistance: Supervision Gait Distance (Feet): 300 Feet Assistive device: None Gait Pattern/deviations: Step-through pattern, Decreased stride length Gait velocity: grossly 0.4 m/s     General Gait Details: no AD needed, although requests to push O2 trolley, very poor understanding of short bouts of ambulation versus one long bout of ambulation and when longer distance ambulation is required to take frequent rest breaks        Balance Overall balance assessment: Needs assistance Sitting-balance support: Feet supported, No upper extremity supported Sitting balance-Leahy Scale: Good     Standing balance support: During functional activity, No upper extremity supported Standing balance-Leahy Scale: Good Standing balance comment: no LOB without AD                            Cognition Arousal/Alertness: Awake/alert Behavior During Therapy: WFL for tasks assessed/performed Overall Cognitive Status: Within Functional Limits for tasks assessed                         Following Commands: Follows one step commands consistently, Follows multi-step commands consistently   Awareness: Anticipatory   General Comments: pt with poor understanding of her disease process, provided  education on her need for oxygen vs her comfort level with supplemental O2, provided education on energy conservation and building endurance, poor understanding of her energy reserve           General Comments General comments (skin  integrity, edema, etc.): able to ambulate on RA, and maintain SpO2 >94%O2, HR max noted 132 bpm, pt requires increased cuing encouragement to take standing rest breaks to keep HR more regulated and practice energy conservation, pt requests being placed back on O2 with return to room for comfort, educated on not needing supplemental O2 and not developing reliance on it. pt reports MD requested she be on supplemental O2 until she discharges      Pertinent Vitals/Pain Pain Assessment Pain Assessment: Faces Faces Pain Scale: Hurts a little bit Pain Location: L chest Pain Descriptors / Indicators: Discomfort Pain Intervention(s): Monitored during session     PT Goals (current goals can now be found in the care plan section) Acute Rehab PT Goals Patient Stated Goal: improve my breathing and to go home PT Goal Formulation: With patient Time For Goal Achievement: 10/28/22 Potential to Achieve Goals: Good Progress towards PT goals: Progressing toward goals    Frequency    Min 3X/week      PT Plan Current plan remains appropriate       AM-PAC PT "6 Clicks" Mobility   Outcome Measure  Help needed turning from your back to your side while in a flat bed without using bedrails?: None Help needed moving from lying on your back to sitting on the side of a flat bed without using bedrails?: None Help needed moving to and from a bed to a chair (including a wheelchair)?: None Help needed standing up from a chair using your arms (e.g., wheelchair or bedside chair)?: None Help needed to walk in hospital room?: A Little Help needed climbing 3-5 steps with a railing? : A Little 6 Click Score: 22    End of Session Equipment Utilized During Treatment: Gait belt;Oxygen Activity Tolerance: Patient tolerated treatment well Patient left: with call bell/phone within reach;in bed Nurse Communication: Mobility status;Other (comment) (O2 sats) PT Visit Diagnosis: Muscle weakness (generalized)  (M62.81);Difficulty in walking, not elsewhere classified (R26.2)     Time: [id number] PT Time Calculation (min) (ACUTE ONLY): 30 min  Charges:  $Therapeutic Exercise: 23-37 mins                     Julie King B. Migdalia Dk PT, DPT Acute Rehabilitation Services Please use secure chat or  Call Office [phone]    [place] 10/28/2022, 12:33 PM

## 2022-10-28 NOTE — Discharge Summary (Signed)
Physician Discharge Summary   ICY CHAMPIGNY [id number] DOB: 1991/05/22 DOA: 10/14/2022  PCP: Hattie Perch, MD  Admit date: 10/14/2022 Discharge date: 10/28/2022  Admitted From: Home Disposition:  Home Discharging physician: Dwyane Dee, MD Barriers to discharge:   Recommendations for Outpatient Follow-up:  Follow up with CTS Follow up with pulmonology Follow up with ID    Discharge Condition: stable CODE STATUS: Full Diet recommendation:  Diet Orders (From admission, onward)     Start     Ordered   10/28/22 0000  Diet general        10/28/22 1150   10/21/22 1848  Diet regular Room service appropriate? Yes; Fluid consistency: Thin  Diet effective now       Question Answer Comment  Room service appropriate? Yes   Fluid consistency: Thin      10/21/22 1847            Hospital Course: Ms. 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  Pathology negative for malignancy and showed inflammation with focal necrosis, fibrosis and pneumocyte hyperplasia.  Discharged home after surgery.     Presented back to ED with 3 days of worsening left-sided chest pain, subjective fever, cough, nausea and orthopnea. CTA with postsurgical changes small left pleural effusion. Due to persistent leukocytosis and febrile, chest x-ray was repeated on 2/19 which showed increased effusion on the left side. IR guided thoracentesis 500 mL removed.  Underwent VATS on 2/23.     Assessment and Plan:  Sepsis, POA, left lower lobe pneumonia with complicated parapneumonic effusion with pleurisy Postop pleural space infection -Blood cultures NTD, sputum cultures 2/17 showed respiratory flora.  Surgical cultures from 2/8 with Streptococcus. -CTA negative for pulmonary embolism.  Doppler ultrasound negative  for DVT -Fluid culture from thoracentesis 2/19 showed no growth. - underwent VATS and drainage of left pleural effusion on 2/23, tissue cultures rare staph epi -Chest tubes removed -ID consulted, recommended starting Augmentin 875 /125 3 times daily and doxycycline 100 mg p.o.BID, at least 4 weeks of antibiotics from VATS Follow AFB cultures, ID follow-up scheduled on 3/18 -stable persistent left pleural effusion and persistent small loculated left hydropneumothorax; stable for discharge, discussed with CTS   Acute on chronic microcytic anemia, iron deficiency  - Baseline hemoglobin about 9-10.  Iron 16, ferritin 104, percent saturation ratio 8   Leukocytosis --Procalcitonin less than 0.1 -ID following, no worsening symptoms or fevers.   The patient's chronic medical conditions were treated accordingly per the patient's home medication regimen except as noted.  On day of discharge, patient was felt deemed stable for discharge. Patient/family member advised to call PCP or come back to ER if needed.   Principal Diagnosis: SIRS (systemic inflammatory response syndrome) ([place])  Discharge Diagnoses: Active Hospital Problems   Diagnosis Date Noted   SIRS (systemic inflammatory response syndrome) ([place]) 10/14/2022    Priority: 2.   Healthcare-associated pneumonia 10/14/2022    Priority: 1.   Pleural effusion 10/14/2022    Priority: 1.   Chest pain 10/14/2022    Priority: 3.   Microcytic hypochromic anemia 10/14/2022    Priority: 4.   Tobacco abuse 10/14/2022    Priority: 5.   Lung mass 08/28/2022    Priority: 7.   Morbidly obese ([place]) 07/25/2017    Priority: 8.   Pneumonia 10/15/2022  Resolved Hospital Problems  No resolved problems to display.     Discharge Instructions     Diet general   Complete by: As directed    Increase activity slowly   Complete by: As directed       Allergies as of 10/28/2022       Reactions   Percocet [oxycodone-acetaminophen] Hives, Itching,  Nausea Only   Pineapple Itching   Fresh pineapples; pineapples from a can is okay        Medication List     STOP taking these medications    chlorpheniramine-HYDROcodone 10-8 MG/5ML Commonly known as: TUSSIONEX   diclofenac Sodium 1 % Gel Commonly known as: VOLTAREN   doxycycline 100 MG capsule Commonly known as: VIBRAMYCIN Replaced by: doxycycline 100 MG tablet   fluticasone 50 MCG/ACT nasal spray Commonly known as: FLONASE   HYDROmorphone 2 MG tablet Commonly known as: DILAUDID   ibuprofen 200 MG tablet Commonly known as: ADVIL   ibuprofen 600 MG tablet Commonly known as: ADVIL   nicotine 14 mg/24hr patch Commonly known as: NICODERM CQ - dosed in mg/24 hours       TAKE these medications    acetaminophen 500 MG tablet Commonly known as: TYLENOL Take 1,000 mg by mouth 2 (two) times daily as needed for mild pain, moderate pain, headache or fever.   albuterol 108 (90 Base) MCG/ACT inhaler Commonly known as: VENTOLIN HFA Inhale 2 puffs into the lungs every 6 (six) hours as needed for wheezing or shortness of breath (Cough).   amoxicillin-clavulanate 875-125 MG tablet Commonly known as: AUGMENTIN Take 1 tablet by mouth every 8 (eight) hours for 22 days.   benzonatate 200 MG capsule Commonly known as: TESSALON Take 1 capsule (200 mg total) by mouth 3 (three) times daily.   doxycycline 100 MG tablet Commonly known as: VIBRA-TABS Take 1 tablet (100 mg total) by mouth every 12 (twelve) hours for 22 days. Replaces: doxycycline 100 MG capsule   gabapentin 300 MG capsule Commonly known as: NEURONTIN Take 1 capsule (300 mg total) by mouth 3 (three) times daily. What changed: when to take this   ipratropium-albuterol 0.5-2.5 (3) MG/3ML Soln Commonly known as: DUONEB Take 3 mLs by nebulization 4 (four) times daily. What changed:  when to take this reasons to take this   mometasone-formoterol 200-5 MCG/ACT Aero Commonly known as: DULERA Inhale 2 puffs  into the lungs 2 (two) times daily.        Follow-up Information     [facility]. Go on 11/08/2022.   Why: PA/LAT CXR to be taken on 03/18. Please arrive by 2:30 pm Contact information: [place]        Melrose Nakayama, MD. Go on 11/14/2022.   Specialty: Cardiothoracic Surgery Why: Appointment time is at 4:00 pm Contact information: [place] [id number] [phone]         [facility] Follow up.   Why: shower chair arranged- they will deliver to the home Contact information: [phone]               Allergies  Allergen Reactions   Percocet [Oxycodone-Acetaminophen] Hives, Itching and Nausea Only   Pineapple Itching    Fresh pineapples; pineapples from a can is okay    Consultations: Pulmonology CTS ID  Procedures: 10/21/22: Left VATS drainage of parapneumonic effusion and decortication   Discharge Exam: BP (!) 153/97 (BP Location: Right Wrist)   Pulse 98   Temp  98.5 F (36.9 C) (Oral)   Resp 15   Ht '5\' 7"'$  (1.702 m)   Wt (!) 145.2 kg   LMP 09/30/2022 (Exact Date)   SpO2 98%   BMI 50.12 kg/m  Physical Exam Constitutional:      Appearance: She is well-developed.  HENT:     Head: Normocephalic and atraumatic.     Mouth/Throat:     Mouth: Mucous membranes are moist.  Eyes:     Extraocular Movements: Extraocular movements intact.  Cardiovascular:     Rate and Rhythm: Normal rate and regular rhythm.  Pulmonary:     Effort: Pulmonary effort is normal. No respiratory distress.     Breath sounds: No wheezing.  Abdominal:     General: Bowel sounds are normal. There is no distension.     Palpations: Abdomen is soft.     Tenderness: There is no abdominal tenderness.  Musculoskeletal:        General: Normal range of motion.     Cervical back: Normal range of motion and neck supple.  Skin:    General: Skin is warm and dry.  Neurological:     General: No  focal deficit present.     Mental Status: She is alert.  Psychiatric:        Mood and Affect: Mood normal.        Behavior: Behavior normal.      The results of significant diagnostics from this hospitalization (including imaging, microbiology, ancillary and laboratory) are listed below for reference.   Microbiology: Recent Results (from the past 240 hour(s))  Aerobic/Anaerobic Culture w Gram Stain (surgical/deep wound)     Status: None   Collection Time: 10/21/22 12:34 PM   Specimen: PATH Respiratory resection; Body Fluid  Result Value Ref Range Status   Specimen Description PLEURAL FLUID LEFT  Final   Special Requests PT ON CEFEPIME VANC  Final   Gram Stain NO WBC SEEN NO ORGANISMS SEEN   Final   Culture   Final    No growth aerobically or anaerobically. Performed at [facility], [place] [address], [place], [place] [address]    Report Status 10/26/2022 FINAL  Final  Acid Fast Smear (AFB)     Status: None   Collection Time: 10/21/22 12:34 PM   Specimen: PATH Respiratory resection; Body Fluid  Result Value Ref Range Status   AFB Specimen Processing Concentration  Final   Acid Fast Smear Negative  Final    Comment: (NOTE) Performed At: [facility] [place], [place] [id number] Rush Farmer MD [id number]    Source (AFB) PLEURAL  Corrected    Comment: LEFT FLUID Performed at [facility], [place] [address], [place], [place] [address] CORRECTED ON 02/23 AT 1551: PREVIOUSLY REPORTED AS TISSUE LEFT ANKLE   Aerobic/Anaerobic Culture w Gram Stain (surgical/deep wound)     Status: None   Collection Time: 10/21/22 12:50 PM   Specimen: PATH Respiratory resection; Tissue  Result Value Ref Range Status   Specimen Description TISSUE PLEURAL  Final   Special Requests  PEEL PT ON VANC CEFEPIME  Final   Gram Stain   Final    RARE WBC PRESENT, PREDOMINANTLY PMN NO ORGANISMS SEEN    Culture   Final    RARE STAPHYLOCOCCUS EPIDERMIDIS NO ANAEROBES  ISOLATED Performed at [facility], [address] [place], [place] [address]    Report Status 10/27/2022 FINAL  Final   Organism ID, Bacteria STAPHYLOCOCCUS EPIDERMIDIS  Final      Susceptibility   Staphylococcus epidermidis - MIC*    CIPROFLOXACIN >=8 RESISTANT Resistant     ERYTHROMYCIN >=8 RESISTANT Resistant     GENTAMICIN <=0.5 SENSITIVE Sensitive     OXACILLIN >=4 RESISTANT Resistant     TETRACYCLINE 2 SENSITIVE Sensitive     VANCOMYCIN 1 SENSITIVE Sensitive     TRIMETH/SULFA 80 RESISTANT Resistant     CLINDAMYCIN >=8 RESISTANT Resistant     RIFAMPIN <=0.5 SENSITIVE Sensitive     Inducible Clindamycin NEGATIVE Sensitive     * RARE STAPHYLOCOCCUS EPIDERMIDIS  Acid Fast Smear (AFB)     Status: None   Collection Time: 10/21/22 12:50 PM   Specimen: PATH Respiratory resection; Tissue  Result Value Ref Range Status   AFB Specimen Processing Comment  Final    Comment: Tissue Grinding and Digestion/Decontamination   Acid Fast Smear Negative  Final    Comment: (NOTE) Performed At: [facility] [place], [place] [id number] Rush Farmer MD [id number]    Source (AFB) TISSUE  Final    Comment: PLEURAL PEEL Performed at [facility], [place] [address], [place], [place] [address]   Aerobic/Anaerobic Culture w Gram Stain (surgical/deep wound)     Status: None   Collection Time: 10/21/22  1:22 PM   Specimen: PATH Respiratory resection; Tissue  Result Value Ref Range Status   Specimen Description TISSUE PLEURAL  Final   Special Requests PEEL PT ON CEFEPIME VANC  Final   Gram Stain   Final    FEW WBC PRESENT,BOTH PMN AND MONONUCLEAR NO ORGANISMS SEEN    Culture   Final    No growth aerobically or anaerobically. Performed at [facility], [place] [address], [place], [place] [address]    Report Status 10/26/2022 FINAL  Final  Acid Fast Smear (AFB)     Status: None   Collection Time: 10/21/22  1:22 PM   Specimen: PATH Respiratory resection;  Tissue  Result Value Ref Range Status   AFB Specimen Processing Comment  Final    Comment: Tissue Grinding and Digestion/Decontamination   Acid Fast Smear Negative  Final    Comment: (NOTE) Performed At: [facility] [place], [place] [id number] Rush Farmer MD [id number]    Source (AFB) TISSUE  Final    Comment: PLEURAL PEEL NO 2 Performed at [facility], [place] [address] [place], [place] [address]      Labs: BNP (last 3 results) Recent Labs    08/29/22 1848 10/14/22 0229  BNP 27.7 [id number]   Basic Metabolic Panel: Recent Labs  Lab 10/22/22 0436 10/23/22 0435 10/24/22 0517 10/25/22 0613 10/26/22 0519  NA 136 139 137 138 137  K 4.6 4.1 3.6 3.9 4.1  CL 103 106 104 105 103  CO2 '26 25 25 28 23  '$ GLUCOSE 109* 87 128* 88 83  BUN 9 <5* '9 8 8  '$ CREATININE 0.71 0.57 0.59 0.50 0.63  CALCIUM 8.5* 8.4* 8.3* 8.4* 8.9   Liver Function Tests: Recent Labs  Lab 10/23/22 0435 10/24/22 0517  AST 15 18  ALT 25 22  ALKPHOS 75 72  BILITOT 0.6 0.5  PROT 6.0* 5.9*  ALBUMIN 2.1* 2.1*   No results for input(s): "LIPASE", "AMYLASE" in the last 168 hours. No results for input(s): "AMMONIA" in the last 168 hours. CBC: Recent Labs  Lab 10/23/22 0435 10/24/22 0517 10/25/22 0600 10/26/22 0519 10/27/22 0419  WBC 10.6* 9.2 9.9 10.6* 11.6*  NEUTROABS 7.3 6.5 6.8  --   --   HGB 7.5* 7.8* 7.9* 8.3* 7.9*  HCT 25.1* 25.6* 26.8* 27.8* 25.9*  MCV 61.2* 60.2* 61.0* 60.6* 59.8*  PLT 322 326 362 370 353   Cardiac Enzymes: No results for input(s): "CKTOTAL", "CKMB", "CKMBINDEX", "TROPONINI" in the last 168 hours. BNP: Invalid input(s): "POCBNP" CBG: Recent Labs  Lab 10/22/22 1136 10/22/22 1612  GLUCAP 133* 203*   D-Dimer No results for input(s): "DDIMER" in the last 72 hours. Hgb A1c No results for input(s): "HGBA1C" in the last 72 hours. Lipid Profile No results for input(s): "CHOL", "HDL", "LDLCALC", "TRIG", "CHOLHDL", "LDLDIRECT" in the last 72  hours. Thyroid function studies No results for input(s): "TSH", "T4TOTAL", "T3FREE", "THYROIDAB" in the last 72 hours.  Invalid input(s): "FREET3" Anemia work up No results for input(s): "VITAMINB12", "FOLATE", "FERRITIN", "TIBC", "IRON", "RETICCTPCT" in the last 72 hours. Urinalysis    Component Value Date/Time   COLORURINE YELLOW 10/04/2022 1400   APPEARANCEUR CLEAR 10/04/2022 1400   LABSPEC 1.024 10/04/2022 1400   PHURINE 6.0 10/04/2022 1400   GLUCOSEU NEGATIVE 10/04/2022 1400   HGBUR NEGATIVE 10/04/2022 1400   BILIRUBINUR NEGATIVE 10/04/2022 1400   KETONESUR NEGATIVE 10/04/2022 1400   PROTEINUR NEGATIVE 10/04/2022 1400   NITRITE NEGATIVE 10/04/2022 1400   LEUKOCYTESUR NEGATIVE 10/04/2022 1400   Sepsis Labs Recent Labs  Lab 10/24/22 0517 10/25/22 0600 10/26/22 0519 10/27/22 0419  WBC 9.2 9.9 10.6* 11.6*   Microbiology Recent Results (from the past 240 hour(s))  Aerobic/Anaerobic Culture w Gram Stain (surgical/deep wound)     Status: None   Collection Time: 10/21/22 12:34 PM   Specimen: PATH Respiratory resection; Body Fluid  Result Value Ref Range Status   Specimen Description PLEURAL FLUID LEFT  Final   Special Requests PT ON CEFEPIME VANC  Final   Gram Stain NO WBC SEEN NO ORGANISMS SEEN   Final   Culture   Final    No growth aerobically or anaerobically. Performed at [facility], [place] [address], [place], [place] [address]    Report Status 10/26/2022 FINAL  Final  Acid Fast Smear (AFB)     Status: None   Collection Time: 10/21/22 12:34 PM   Specimen: PATH Respiratory resection; Body Fluid  Result Value Ref Range Status   AFB Specimen Processing Concentration  Final   Acid Fast Smear Negative  Final    Comment: (NOTE) Performed At: [facility] [place], [place] [id number] Rush Farmer MD [id number]    Source (AFB) PLEURAL  Corrected    Comment: LEFT FLUID Performed at [facility], [place] [address]  [place], [place] [address] CORRECTED ON 02/23 AT 1551: PREVIOUSLY REPORTED AS TISSUE LEFT ANKLE   Aerobic/Anaerobic Culture w Gram Stain (surgical/deep wound)     Status: None   Collection Time: 10/21/22 12:50 PM   Specimen: PATH Respiratory resection; Tissue  Result Value Ref Range Status   Specimen Description TISSUE PLEURAL  Final   Special Requests  PEEL PT ON VANC CEFEPIME  Final   Gram Stain   Final    RARE WBC PRESENT, PREDOMINANTLY PMN NO ORGANISMS SEEN    Culture   Final    RARE STAPHYLOCOCCUS EPIDERMIDIS NO ANAEROBES ISOLATED Performed at [facility], [address], [place],  [address]    Report Status 10/27/2022 FINAL  Final   Organism ID, Bacteria STAPHYLOCOCCUS EPIDERMIDIS  Final      Susceptibility   Staphylococcus epidermidis - MIC*  CIPROFLOXACIN >=8 RESISTANT Resistant     ERYTHROMYCIN >=8 RESISTANT Resistant     GENTAMICIN <=0.5 SENSITIVE Sensitive     OXACILLIN >=4 RESISTANT Resistant     TETRACYCLINE 2 SENSITIVE Sensitive     VANCOMYCIN 1 SENSITIVE Sensitive     TRIMETH/SULFA 80 RESISTANT Resistant     CLINDAMYCIN >=8 RESISTANT Resistant     RIFAMPIN <=0.5 SENSITIVE Sensitive     Inducible Clindamycin NEGATIVE Sensitive     * RARE STAPHYLOCOCCUS EPIDERMIDIS  Acid Fast Smear (AFB)     Status: None   Collection Time: 10/21/22 12:50 PM   Specimen: PATH Respiratory resection; Tissue  Result Value Ref Range Status   AFB Specimen Processing Comment  Final    Comment: Tissue Grinding and Digestion/Decontamination   Acid Fast Smear Negative  Final    Comment: (NOTE) Performed At: [facility] [place], [place] [id number] Rush Farmer MD [id number]    Source (AFB) TISSUE  Final    Comment: PLEURAL PEEL Performed at [facility], [place] [address], [place], [place] [address]   Aerobic/Anaerobic Culture w Gram Stain (surgical/deep wound)     Status: None   Collection Time: 10/21/22  1:22 PM   Specimen: PATH  Respiratory resection; Tissue  Result Value Ref Range Status   Specimen Description TISSUE PLEURAL  Final   Special Requests PEEL PT ON CEFEPIME VANC  Final   Gram Stain   Final    FEW WBC PRESENT,BOTH PMN AND MONONUCLEAR NO ORGANISMS SEEN    Culture   Final    No growth aerobically or anaerobically. Performed at [facility], [place] [address], [place], [place] [address]    Report Status 10/26/2022 FINAL  Final  Acid Fast Smear (AFB)     Status: None   Collection Time: 10/21/22  1:22 PM   Specimen: PATH Respiratory resection; Tissue  Result Value Ref Range Status   AFB Specimen Processing Comment  Final    Comment: Tissue Grinding and Digestion/Decontamination   Acid Fast Smear Negative  Final    Comment: (NOTE) Performed At: [facility] [place], [place] [id number] Rush Farmer MD [id number]    Source (AFB) TISSUE  Final    Comment: PLEURAL PEEL NO 2 Performed at [facility], [place] [address] [place],  [address]     Procedures/Studies: DG Chest 2 View  Result Date: 10/28/2022 CLINICAL DATA:  Pleural effusion. EXAM: CHEST - 2 VIEW COMPARISON:  Multiple priors including most recent chest radiograph October 27, 2022 FINDINGS: Right IJ CVC with tip near the superior cavoatrial junction. The heart size and mediastinal contours are partially obscured but unchanged. Similar layering moderate left hydropneumothorax with adjacent airspace consolidation. Right lung is clear. The visualized skeletal structures are unchanged. IMPRESSION: Similar layering moderate left hydropneumothorax with adjacent airspace consolidation. Electronically Signed   By: Dahlia Bailiff M.D.   On: 10/28/2022 08:18   DG Chest 2 View  Result Date: 10/27/2022 CLINICAL DATA:  LEFT pleural effusion EXAM: CHEST - 2 VIEW COMPARISON:  10/26/2022 FINDINGS: RIGHT jugular line stable tip projecting over SVC. Upper normal heart size. Mediastinal contours and pulmonary  vascularity normal. Persistent LEFT pleural effusion and basilar atelectasis. Small loculated hydropneumothorax mid to lower LEFT chest unchanged. RIGHT lung remains clear. Broad-based dextroconvex thoracic scoliosis. IMPRESSION: Persistent LEFT pleural effusion and basilar atelectasis. Persistent small loculated LEFT hydropneumothorax mid to lower LEFT chest. Electronically Signed   By: Crist Infante.D.  On: 10/27/2022 08:56   DG Chest 2 View  Result Date: 10/26/2022 CLINICAL DATA:  Pleural effusion EXAM: CHEST - 2 VIEW COMPARISON:  Multiple chest x-rays and a CT scan of the chest since October 14, 2022. FINDINGS: An air-fluid level in the lateral left lung base has likely been present since October 21, 2022 and is nonspecific but probably a small hydropneumothorax developing after chest tube placement. There does appear to be a small left-sided pneumothorax with apical and components. The effusion does remain but appears smaller. The left chest tube is been removed. An opacity underlies the effusion in the left base. No right-sided pneumothorax. The right lung is clear. The cardiomediastinal silhouette is stable. The right central line terminates in the central SVC. No other interval changes. IMPRESSION: 1. A small left-sided pneumothorax is identified after chest tube removal. Recommend attention on close follow-up. 2. An air-fluid level in the lateral left lung base is nonspecific but could represent a small hydropneumothorax developing after chest tube placement. 3. The left effusion is smaller. 4. Underlying opacity in the left base could represent atelectasis or infiltrate. 5. No other interval changes. These results will be called to the ordering clinician or representative by the Radiologist Assistant, and communication documented in the PACS or [employer]. Electronically Signed   By: Dorise Bullion III M.D.   On: 10/26/2022 08:39   DG CHEST PORT 1 VIEW  Result Date: 10/25/2022 CLINICAL  DATA:  Pleural effusion EXAM: PORTABLE CHEST 1 VIEW COMPARISON:  Portable exam 0546 hours compared to 10/24/2022 FINDINGS: RIGHT jugular line with tip projecting over SVC. LEFT thoracostomy tube, second thoracostomy tube removed in interval since prior study. Enlargement of cardiac silhouette with stable mediastinal contours. Persistent volume loss in LEFT hemithorax with elevation of diaphragm. Persistent small LEFT pleural effusion and LEFT basilar atelectasis. Improved atelectasis RIGHT base. No pneumothorax. IMPRESSION: LEFT chest tube with persistent small LEFT pleural effusion and basilar atelectasis. Electronically Signed   By: Lavonia Dana M.D.   On: 10/25/2022 08:26   DG CHEST PORT 1 VIEW  Result Date: 10/24/2022 CLINICAL DATA:  Left chest tube EXAM: PORTABLE CHEST 1 VIEW COMPARISON:  10/23/2022 FINDINGS: Right internal jugular central line is unchanged with its tip in the SVC at the azygos level. Two left chest tubes remain in place. No pleural air. Abnormal density persists in the left chest consistent with a combination of residual pleural fluid and pulmonary atelectasis or infiltrate. Mild volume loss seen at the right lung base. IMPRESSION: No significant change. No pleural air. Persistent abnormal density in the left chest consistent with a combination of pleural fluid and pulmonary atelectasis or infiltrate. Electronically Signed   By: Nelson Chimes M.D.   On: 10/24/2022 08:03   DG CHEST PORT 1 VIEW  Result Date: 10/23/2022 CLINICAL DATA:  Pleural effusion EXAM: PORTABLE CHEST 1 VIEW COMPARISON:  10/22/2022 FINDINGS: Left-sided chest tubes are stable in positioning. Right IJ central venous catheter terminates at the level of the distal SVC. Stable heart size. Low lung volumes with elevation of the left hemidiaphragm. Persistent small left pleural effusion. Left greater than right bibasilar opacities. No pneumothorax. IMPRESSION: Persistent small left pleural effusion with left greater than  right bibasilar opacities, not significantly changed from prior. No pneumothorax. Electronically Signed   By: Davina Poke D.O.   On: 10/23/2022 10:57   DG Chest Port 1 View  Result Date: 10/22/2022 CLINICAL DATA:  Follow-up left pneumothorax. EXAM: PORTABLE CHEST 1 VIEW COMPARISON:  10/21/2022 FINDINGS: Two  left-sided chest tubes remain in place as well as right jugular central venous catheter. No pneumothorax is visualized. Left lung subsegmental atelectasis is again seen as well as probable tiny left pleural effusion. Minimal atelectasis noted in the right lung base, also without change. IMPRESSION: No pneumothorax visualized. Left chest tubes remain in place. Stable left lung subsegmental atelectasis and probable tiny left pleural effusion. Electronically Signed   By: Marlaine Hind M.D.   On: 10/22/2022 09:07   DG Chest Port 1 View  Result Date: 10/21/2022 CLINICAL DATA:  Pneumothorax. Right central line. EXAM: PORTABLE CHEST 1 VIEW COMPARISON:  Radiographs yesterday. CT 10/14/2022 FINDINGS: Two left-sided chest tubes have been placed. Decreased left pleural effusion with residual [place] opacity in the left mid lower lung zone, likely residual fusion. No convincing pneumothorax. There is subcutaneous emphysema in the left lateral chest wall. Right internal jugular central venous catheter tip overlies the mid SVC. There is no right pneumothorax. Ill-defined opacity at the right lung base. No large right pleural effusion. Grossly stable heart size and mediastinal contours. IMPRESSION: 1. Placement of 2 left-sided chest tubes with decreased left pleural effusion. Residual hazy opacity in the left mid and lower lung zone. No convincing pneumothorax. Subcutaneous emphysema in the left lateral chest wall. 2. Right internal jugular central venous catheter tip overlies the SVC. No right-sided pneumothorax. 3. Ill-defined opacity at the right lung base, similar to recent CT. Electronically Signed   By: Keith Rake M.D.   On: 10/21/2022 17:38   DG Chest 2 View  Result Date: 10/20/2022 CLINICAL DATA:  Pleural effusion EXAM: CHEST - 2 VIEW COMPARISON:  None Available. FINDINGS: Enlarged cardiac silhouette. The LEFT heart border is obscured by large LEFT pleural effusion. No change in effusion volume. Central venous congestion in the RIGHT lung. No pneumothorax. IMPRESSION: 1. No change in large LEFT pleural effusion. 2. Central venous congestion. 3. No interval change. Electronically Signed   By: Suzy Bouchard M.D.   On: 10/20/2022 08:32   DG Chest 2 View  Result Date: 10/19/2022 CLINICAL DATA:  32 year old female with left pleural effusion. EXAM: CHEST - 2 VIEW COMPARISON:  10/18/2022, 10/14/2022 FINDINGS: Similar appearing mild rightward mediastinal shift with obscuration of the left aspect of the cardiomediastinal silhouette, unchanged. Similar appearance of large left pleural effusion. Similar to slightly worsening diffuse hazy bibasilar opacities. No acute osseous abnormality. IMPRESSION: 1. Similar appearing large left pleural effusion with associated left basilar atelectasis. 2. Slight interval increased hazy opacities about the right lower lobe as could be seen with worsening pneumonia, atelectasis, edema, or technical factors due to positioning. Electronically Signed   By: Ruthann Cancer M.D.   On: 10/19/2022 07:56   DG CHEST PORT 1 VIEW  Result Date: 10/18/2022 CLINICAL DATA:  Pneumonia EXAM: PORTABLE CHEST 1 VIEW COMPARISON:  10/17/2022, CT 10/14/2022 FINDINGS: Residual moderate large left effusion appears slightly increased. Worsened airspace disease at left lung base. Slightly improved aeration of right thorax. Obscured cardiomediastinal silhouette. No pneumothorax IMPRESSION: Slightly increased moderate to large left effusion with worsened airspace disease at the left lung base. Slightly improved aeration of the right thorax. Electronically Signed   By: Donavan Foil M.D.   On: 10/18/2022  17:05   IR THORACENTESIS ASP PLEURAL SPACE W/IMG GUIDE  Result Date: 10/17/2022 INDICATION: Patient s/p left VATS and wedge resection 10/06/22. Patient presents today with a left pleural effusion. Interventional radiology asked to perform a diagnostic and therapeutic thoracentesis. EXAM: ULTRASOUND GUIDED THORACENTESIS MEDICATIONS: 1% lidocaine 20 mL  COMPLICATIONS: SIR LEVEL B - Normal therapy, CBC/labs ordered. Spleen mistaken for pleural effusion initially. PROCEDURE: An ultrasound guided thoracentesis was thoroughly discussed with the patient and questions answered. The benefits, risks, alternatives and complications were also discussed. The patient understands and wishes to proceed with the procedure. Written consent was obtained. Ultrasound was performed to localize and mark an adequate pocket of fluid in the left chest. The area was then prepped and draped in the normal sterile fashion. 1% Lidocaine was used for local anesthesia. Under ultrasound guidance a 19 gauge 10-cm Yueh catheter was introduced inadvertently into the spleen. The catheter was removed. Ultrasound evaluation with color doppler demonstrated no evidence of surrounding hematoma or other complicating features. A more superior location in the left posterior thorax was selected for thoracentesis. Subdermal Local anesthesia was applied with 1% lidocaine. Deeper local anesthetic was administered ultrasound guidance adjacent to the pleura. Under direct ultrasound visualization, a 10 cm Yueh needle was directed into the pleural space. Thoracentesis was performed. The catheter was removed and a dressing applied. FINDINGS: A total of approximately 500 ml of clear yellow fluid was removed. Samples were sent to the laboratory as requested by the clinical team. IMPRESSION: Successful ultrasound guided left thoracentesis yielding 500 mL of pleural fluid. Read by: Soyla Dryer, NP Electronically Signed   By: Ruthann Cancer M.D.   On: 10/17/2022 13:50    DG Chest 1 View  Result Date: 10/17/2022 CLINICAL DATA:  [id number] S/P thoracentesis [id number] EXAM: CHEST  1 VIEW COMPARISON:  Radiograph 10/17/2022 FINDINGS: Unchanged cardiomegaly. Decreased but persistent left pleural effusion after thoracentesis. No evidence of pneumothorax. Slight improved aeration in the left lower lung. Persistent right lower lung airspace disease. Bones are unchanged. IMPRESSION: Decreased left pleural effusion and slight improved aeration in the left lower lung after thoracentesis. No evidence of pneumothorax. Persistent right lower lung airspace disease. Electronically Signed   By: Maurine Simmering M.D.   On: 10/17/2022 12:55   DG CHEST PORT 1 VIEW  Result Date: 10/17/2022 CLINICAL DATA:  Pneumonia EXAM: PORTABLE CHEST 1 VIEW COMPARISON:  10/14/2022 FINDINGS: Stable heart size. Large left pleural effusion, increased. Worsening bibasilar airspace opacities. Probable small right pleural effusion. No pneumothorax. IMPRESSION: 1. Large left pleural effusion, increased. 2. Worsening bibasilar airspace opacities. Electronically Signed   By: Davina Poke D.O.   On: 10/17/2022 08:46   VAS [place] LOWER EXTREMITY VENOUS (DVT)  Result Date: 10/15/2022  Lower Venous DVT Study Patient Name:  TASHONNA SILLIMAN Golaszewski  Date of Exam:   10/15/2022 Medical Rec #: [id number]          Accession #:    [id number] Date of Birth: 1991-06-13         Patient Gender: F Patient Age:   48 years Exam Location:  [facility] Procedure:      VAS [place] LOWER EXTREMITY VENOUS (DVT) Referring Phys: Fuller Plan --------------------------------------------------------------------------------  Indications: SOB.  Comparison Study: No prior study on file Performing Technologist: Sharion Dove RVS  Examination Guidelines: A complete evaluation includes B-mode imaging, spectral Doppler, color Doppler, and power Doppler as needed of all accessible portions of each vessel. Bilateral testing is considered an integral part of a  complete examination. Limited examinations for reoccurring indications may be performed as noted. The reflux portion of the exam is performed with the patient in reverse Trendelenburg.  +---------+---------------+---------+-----------+----------+--------------+ RIGHT    CompressibilityPhasicitySpontaneityPropertiesThrombus Aging +---------+---------------+---------+-----------+----------+--------------+ CFV      Full           Yes  Yes                                 +---------+---------------+---------+-----------+----------+--------------+ SFJ      Full                                                        +---------+---------------+---------+-----------+----------+--------------+ FV Prox  Full                                                        +---------+---------------+---------+-----------+----------+--------------+ FV Mid   Full                                                        +---------+---------------+---------+-----------+----------+--------------+ FV DistalFull                                                        +---------+---------------+---------+-----------+----------+--------------+ PFV      Full                                                        +---------+---------------+---------+-----------+----------+--------------+ POP      Full           Yes      Yes                                 +---------+---------------+---------+-----------+----------+--------------+ PTV      Full                                                        +---------+---------------+---------+-----------+----------+--------------+ PERO     Full                                                        +---------+---------------+---------+-----------+----------+--------------+   +---------+---------------+---------+-----------+----------+--------------+ LEFT     CompressibilityPhasicitySpontaneityPropertiesThrombus Aging  +---------+---------------+---------+-----------+----------+--------------+ CFV      Full           Yes      Yes                                 +---------+---------------+---------+-----------+----------+--------------+ SFJ      Full                                                        +---------+---------------+---------+-----------+----------+--------------+  FV Prox  Full                                                        +---------+---------------+---------+-----------+----------+--------------+ FV Mid   Full                                                        +---------+---------------+---------+-----------+----------+--------------+ FV DistalFull                                                        +---------+---------------+---------+-----------+----------+--------------+ PFV      Full                                                        +---------+---------------+---------+-----------+----------+--------------+ POP      Full           Yes      Yes                                 +---------+---------------+---------+-----------+----------+--------------+ PTV      Full                                                        +---------+---------------+---------+-----------+----------+--------------+ PERO     Full                                                        +---------+---------------+---------+-----------+----------+--------------+     Summary: BILATERAL: - No evidence of deep vein thrombosis seen in the lower extremities, bilaterally. -No evidence of popliteal cyst, bilaterally.   *See table(s) above for measurements and observations. Electronically signed by Deitra Mayo MD on 10/15/2022 at 6:50:33 PM.    Final    CT Angio Chest PE W and/or Wo Contrast  Result Date: 10/14/2022 CLINICAL DATA:  Pulmonary embolism suspected, high probability EXAM: CT ANGIOGRAPHY CHEST WITH CONTRAST TECHNIQUE: Multidetector CT imaging  of the chest was performed using the standard protocol during bolus administration of intravenous contrast. Multiplanar CT image reconstructions and MIPs were obtained to evaluate the vascular anatomy. RADIATION DOSE REDUCTION: This exam was performed according to the departmental dose-optimization program which includes automated exposure control, adjustment of the mA and/or kV according to patient size and/or use of iterative reconstruction technique. CONTRAST:  4m OMNIPAQUE IOHEXOL 350 MG/ML SOLN COMPARISON:  Yesterday FINDINGS: Cardiovascular: Satisfactory pulmonary artery timing but limited detail of subsegmental vessels due to body habitus primarily. There is also  complicating motion artifact. No visible pulmonary emboli. Normal heart size without pericardial effusion. Normal aorta. Mediastinum/Nodes: Negative for adenopathy or mass Lungs/Pleura: Worsening opacity at the lung bases, especially on the left with volume loss. Moderate left pleural effusion. No pneumothorax. Upper Abdomen: No acute finding. Musculoskeletal: No acute finding Review of the MIP images confirms the above findings. IMPRESSION: 1. No visible pulmonary embolism. Suboptimal peripheral vessel evaluation due to motion and body habitus. 2. Postoperative left chest with worsening atelectasis and moderate left pleural effusion. Electronically Signed   By: Jorje Guild M.D.   On: 10/14/2022 06:28   DG Chest Portable 1 View  Result Date: 10/14/2022 CLINICAL DATA:  Dyspnea EXAM: PORTABLE CHEST 1 VIEW COMPARISON:  10/13/2022 FINDINGS: Lung volumes are small. Left basilar consolidation and small left pleural effusion again identified with slight interval increase in left pleural fluid. Right basilar discoid atelectasis again noted. No pneumothorax. No pleural effusion on the right. Cardiac size is at the upper limits of normal. IMPRESSION: 1. Slight interval increase in left pleural fluid. Stable left basilar consolidation. 2. Pulmonary  hypoinflation. Electronically Signed   By: Fidela Salisbury M.D.   On: 10/14/2022 02:30   CT Angio Chest PE W/Cm &/Or Wo Cm  Result Date: 10/13/2022 CLINICAL DATA:  Status post left VATS and wedge resection of lung nodule involving upper and lower lobes left lung. EXAM: CT ANGIOGRAPHY CHEST WITH CONTRAST TECHNIQUE: Multidetector CT imaging of the chest was performed using the standard protocol during bolus administration of intravenous contrast. Multiplanar CT image reconstructions and MIPs were obtained to evaluate the vascular anatomy. RADIATION DOSE REDUCTION: This exam was performed according to the departmental dose-optimization program which includes automated exposure control, adjustment of the mA and/or kV according to patient size and/or use of iterative reconstruction technique. CONTRAST:  63m OMNIPAQUE IOHEXOL 350 MG/ML SOLN COMPARISON:  Chest CTA 09/30/2022 FINDINGS: Cardiovascular: Heart size upper normal. No thoracic aortic aneurysm. No large central pulmonary embolus in the main pulmonary arteries or lobar/segmental pulmonary arteries to either lung. Subsegmental pulmonary arteries in the lower lobes are not reliably evaluated due to motion artifact. Mediastinum/Nodes: No mediastinal lymphadenopathy. No hilar lymphadenopathy. The esophagus has normal imaging features. There is no axillary lymphadenopathy. Lungs/Pleura: Subsegmental atelectasis noted right lower lobe. There is patchy collapse/consolidation in the posterior lingula and anterior left lower lobe, in proximity to the staple line from wedge resection. Dependent atelectasis noted in the posterior left lower lobe. No right pleural effusion with small to moderate left pleural effusion evident including some fluid probably loculated in the anterior pleural space (image 191/7). Upper Abdomen: Visualized portion of the upper abdomen is unremarkable. Musculoskeletal: No worrisome lytic or sclerotic osseous abnormality. Review of the MIP images  confirms the above findings. IMPRESSION: 1. No large central pulmonary embolus in the main pulmonary arteries or lobar/segmental pulmonary arteries to either lung. Subsegmental pulmonary arteries in the lower lobes are not reliably evaluated due to motion artifact. 2. Patchy collapse/consolidation in the posterior lingula and anterior left lower lobe, in proximity to the staple line from recent wedge resection. 3. Subsegmental atelectasis noted right lower lobe and dependent left lower lobe. 4. Small to moderate left pleural effusion including some fluid probably loculated in the anterior pleural space. Electronically Signed   By: EMisty StanleyM.D.   On: 10/13/2022 18:06   DG Chest Port 1 View  Result Date: 10/13/2022 CLINICAL DATA:  Chest pain, status post left wedge resection EXAM: PORTABLE CHEST 1 VIEW COMPARISON:  10/08/2022 FINDINGS: Interval  removal of a previously seen right neck vascular catheter. The heart size and mediastinal contours are within normal limits. Heterogeneous airspace opacity of the left lung base with elevation of the left hemidiaphragm. Bandlike scarring or atelectasis of the right lung base. The visualized skeletal structures are unremarkable. IMPRESSION: 1. Heterogeneous airspace opacity of the left lung base with elevation of the left hemidiaphragm, appearance in keeping with reported left wedge resection. 2. Bandlike scarring or atelectasis of the right lung base. Electronically Signed   By: Delanna Ahmadi M.D.   On: 10/13/2022 14:21   DG Chest 2 View  Result Date: 10/08/2022 CLINICAL DATA:  Status post wedge resection of lung nodule involving the LEFT UPPER and LOWER lobes. EXAM: CHEST - 2 VIEW COMPARISON:  10/07/2022 and prior studies FINDINGS: A RIGHT IJ central venous catheter with tip overlying the SVC again noted. The cardiomediastinal silhouette is grossly unchanged. LEFT surgical changes are again noted with continued bilateral mid-lower lung opacities/atelectasis.  There is no evidence of pneumothorax. Little significant change is noted since the prior study. IMPRESSION: Unchanged appearance of the chest with continued bilateral mid-lower lung opacities/atelectasis. No evidence of pneumothorax. Electronically Signed   By: Margarette Canada M.D.   On: 10/08/2022 08:23   DG Chest 1V REPEAT Same Day  Result Date: 10/07/2022 CLINICAL DATA:  Encounter for chest tube removal. EXAM: CHEST - 1 VIEW SAME DAY COMPARISON:  10/07/2022. FINDINGS: 1157 hours. Interval removal of the left sided chest tube. No pneumothorax. Unchanged right IJ approach central venous catheter with tip projecting over the mid SVC. Similar linear retrocardiac right mid lung opacities, consistent with atelectasis. Persistent mild blunting of the left costophrenic sulcus. Stable cardiac and mediastinal contours. IMPRESSION: Interval removal of the left-sided chest tube.  No pneumothorax. Electronically Signed   By: Emmit Alexanders M.D.   On: 10/07/2022 12:29   DG Chest Port 1 View  Result Date: 10/07/2022 CLINICAL DATA:  Mass of left lung and post resection. EXAM: PORTABLE CHEST 1 VIEW COMPARISON:  10/06/2022 FINDINGS: Right jugular central line with the tip in the upper SVC region. Heart size is stable. Patchy perihilar and basilar densities most likely represent atelectasis. Poorly visualized left chest tube appears to be stable. Question a tiny left apical pneumothorax. No evidence for a large pneumothorax. IMPRESSION: 1. Stable position of the left chest tube. Difficult to exclude a tiny left apical pneumothorax. 2. Patchy densities in both lungs are most compatible with atelectasis. Electronically Signed   By: Markus Daft M.D.   On: 10/07/2022 07:39   DG Chest Port 1 View  Result Date: 10/06/2022 CLINICAL DATA:  Left lung mass. EXAM: PORTABLE CHEST 1 VIEW COMPARISON:  10/04/2022 FINDINGS: Interval placement of right IJ central venous catheter with tip over the SVC. Lungs are hypoinflated with bilateral  perihilar opacification likely atelectasis and less likely infection. No significant effusion. No pneumothorax. Again noted is patient's lingular mass unchanged. Cardiomediastinal silhouette and remainder of the exam is unchanged. IMPRESSION: 1. Hypoinflation with bilateral perihilar opacification likely atelectasis and less likely infection. 2. Stable lingular mass. 3. Right IJ central venous catheter with tip over the SVC. Electronically Signed   By: Marin Olp M.D.   On: 10/06/2022 11:51   NM PET Image Initial (PI) Skull Base To Thigh  Result Date: 10/05/2022 CLINICAL DATA:  Initial treatment strategy for lung mass. Recent biopsy of LEFT lung mass. EXAM: NUCLEAR MEDICINE PET SKULL BASE TO THIGH TECHNIQUE: 14.9 mCi F-18 FDG was injected intravenously. Full-ring PET imaging  was performed from the skull base to thigh after the radiotracer. CT data was obtained and used for attenuation correction and anatomic localization. Fasting blood glucose: 106 mg/dl COMPARISON:  CT chest 09/30/2022, CT chest 08/28/2022 and CT chest 06/28/2021 FINDINGS: Mediastinal blood pool activity: SUV max 2.37 Liver activity: SUV max NA NECK: No hypermetabolic lymph nodes in the neck. Incidental CT findings: None. CHEST: Within the lingula, 3.5 x 3.5 cm mass with central cavitation again noted and not changed from CT several days prior. Lesion at the site of previous smaller nodule on CT 08/28/2022 measuring 2.5 x 2.6 cm. This nodule was biopsied via bronchoscopy. The lingular mass is intensely hypermetabolic with SUV max equal 7.3 (image 82). Single hypermetabolic LEFT mediastinal/hilar node measures 8 mm adjacent to the LEFT main pulmonary artery (image 69) with fairly intense metabolic activity (SUV max equal 6.6). Incidental CT findings: None. ABDOMEN/PELVIS: No abnormal hypermetabolic activity within the liver, pancreas, adrenal glands, or spleen. No hypermetabolic lymph nodes in the abdomen or pelvis. Hypermetabolic focus within  the skin of the RIGHT inguinal region with SUV max equal 11.9 on image 192. There is subtle skin thickening associated with the metabolic activity. Incidental CT findings: None. SKELETON: No focal hypermetabolic activity to suggest skeletal metastasis. Incidental CT findings: None. IMPRESSION: 1. Intense metabolic activity associated with the biopsied lingular mass. Differential would include bronchogenic carcinoma versus pulmonary infection given the negative fine-needle aspiration and patient's young age. 2. Single hypermetabolic LEFT mediastinal/hilar lymph node is indeterminate. 3. Hypermetabolic cutaneous skin thickening in the RIGHT groin region is favored a benign cutaneous infection or inflammation. Electronically Signed   By: Suzy Bouchard M.D.   On: 10/05/2022 16:01   DG Chest 2 View  Result Date: 10/04/2022 CLINICAL DATA:  Cavitary lingula mass, preoperative evaluation EXAM: CHEST - 2 VIEW COMPARISON:  09/30/2022 FINDINGS: Stable lingula known cavitary mass along the left cardiac border. Right lung remains clear. No new airspace disease, collapse or consolidation. No edema, effusion or pneumothorax. Trachea midline. Normal heart size and vascularity. No acute osseous finding. IMPRESSION: 1. Stable lingula cavitary mass. 2. No interval change or acute process by plain radiography. Electronically Signed   By: Jerilynn Mages.  Shick M.D.   On: 10/04/2022 15:06   CT Angio Chest PE W/Cm &/Or Wo Cm  Result Date: 09/30/2022 CLINICAL DATA:  Left spasm in chest. History of left lung mass. Pulmonary embolism suspected. EXAM: CT ANGIOGRAPHY CHEST WITH CONTRAST TECHNIQUE: Multidetector CT imaging of the chest was performed using the standard protocol during bolus administration of intravenous contrast. Multiplanar CT image reconstructions and MIPs were obtained to evaluate the vascular anatomy. The initial contrast bolus timing was suboptimal, greater attenuation within the aorta than main pulmonary artery. Therefore,  the scan was repeated RADIATION DOSE REDUCTION: This exam was performed according to the departmental dose-optimization program which includes automated exposure control, adjustment of the mA and/or kV according to patient size and/or use of iterative reconstruction technique. CONTRAST:  185m OMNIPAQUE IOHEXOL 350 MG/ML SOLN, 1036mOMNIPAQUE IOHEXOL 350 MG/ML SOLN COMPARISON:  Chest two views 09/30/2022, 09/13/2022, AP chest 08/30/2022, chest two views 06/14/2022; CTA chest 08/28/2022 and 06/28/2021 FINDINGS: Cardiovascular: On the repeat series with the better opacification of the main pulmonary artery, the main pulmonary artery is opacified up to 151 Hounsfield units. This remains suboptimal for evaluation for pulmonary embolism. Within this limitation, no large central pulmonary embolism is seen within the main, left main, or right main pulmonary arteries. Portions of the lobar and more distal  pulmonary arteries are not opacified due to contrast bolus timing, and this study is not diagnostic beyond the flat right main pulmonary artery levels. Heart size is at the upper limits of normal. No pericardial effusion. No thoracic aortic aneurysm. Mediastinum/Nodes: No axillary, mediastinal, or hilar pathologically enlarged lymph nodes by CT criteria. The visualized thyroid is unremarkable. The esophagus follows a normal course and normal caliber. Lungs/Pleura: The central airways are patent. On the prior 08/28/2022 CT there was a spiculated mass within the border of the lingula and anterior segment of left hepatic lobe, measuring up to approximately 3.1 x 3.1 x 2.3 cm (transverse by AP by craniocaudal). This was biopsied on 08/30/2022 reportedly negative. There is now a moderate central cavitary portion with likely layering fluid level. There is increased soft tissue density extending from the lung mass towards the anterior intercostal space between the fifth and sixth ribs, likely from the prior biopsy approach. This  now measures up to approximately 3.3 x 4.2 x 2.6 cm (transverse AP craniocaudal, noting the greater AP dimension appears to be secondary to post biopsy changes anteriorly. There are scattered bilateral ground-glass opacities, likely subsegmental atelectasis. Upper Abdomen: Cholecystectomy clips. Musculoskeletal: No acute skeletal abnormality. Review of the MIP images confirms the above findings. IMPRESSION: 1. Despite the repeat of this study, there is evaluation for pulmonary embolism secondary to contrast bolus timing. No large central pulmonary embolism is seen, however evaluation of the lobar and more distal vessels is limited. 2. Interval biopsy of left lung mass compared to the prior 08/28/2022 CT. Reportedly this biopsy was benign. Interval development of a moderate central cavitary portion within this mass, possibly from the prior biopsy. Additionally, there is increased soft tissue extending from the lung mass there towards the anterior intercostal space between the fifth and sixth ribs, likely from the prior biopsy approach. Electronically Signed   By: Yvonne Kendall M.D.   On: 09/30/2022 16:30   DG Chest 2 View  Result Date: 09/30/2022 CLINICAL DATA:  SOB EXAM: CHEST - 2 VIEW COMPARISON:  Chest x-ray 09/13/2022. FINDINGS: Interval decrease in size of a lingular mass, which now demonstrates gas suggestive of cavitation/necrosis. Otherwise, lungs are clear. Cardiomediastinal silhouette is within normal limits. IMPRESSION: 1. Interval decrease in size of a lingular mass, which now demonstrates gas suggestive of cavitation/necrosis. CT chest with contrast could further evaluate if clinically warranted. 2. Otherwise, no acute cardiopulmonary disease. Electronically Signed   By: Margaretha Sheffield M.D.   On: 09/30/2022 14:34     Time coordinating discharge: Over 30 minutes    Dwyane Dee, MD  [facility] 10/28/2022, 2:14 PM

## 2022-10-28 NOTE — Progress Notes (Signed)
TOC medications delivered to room.

## 2022-10-31 ENCOUNTER — Telehealth: Payer: Self-pay | Admitting: *Deleted

## 2022-10-31 NOTE — Telephone Encounter (Signed)
Patient contacted the office over the weekend with concerns regarding back pain. Per patient her pain was similar to when she was admitted for pleural effusion. Patient states she spoke with on call provider. States her breathing is good, no concerns at this time.

## 2022-11-02 ENCOUNTER — Encounter: Payer: Self-pay | Admitting: Thoracic Surgery (Cardiothoracic Vascular Surgery)

## 2022-11-02 ENCOUNTER — Telehealth: Payer: Medicaid Other | Admitting: Physician Assistant

## 2022-11-02 ENCOUNTER — Encounter: Payer: Self-pay | Admitting: *Deleted

## 2022-11-02 ENCOUNTER — Other Ambulatory Visit: Payer: Self-pay | Admitting: Physician Assistant

## 2022-11-02 DIAGNOSIS — T3695XA Adverse effect of unspecified systemic antibiotic, initial encounter: Secondary | ICD-10-CM | POA: Diagnosis not present

## 2022-11-02 DIAGNOSIS — B379 Candidiasis, unspecified: Secondary | ICD-10-CM

## 2022-11-02 MED ORDER — HYDROMORPHONE HCL 2 MG PO TABS: 2.0000 mg | ORAL_TABLET | Freq: Three times a day (TID) | ORAL | 0 refills | Status: AC | PRN

## 2022-11-02 MED ORDER — FLUCONAZOLE 150 MG PO TABS: 150.0000 mg | ORAL_TABLET | ORAL | 0 refills | Status: DC | PRN

## 2022-11-02 NOTE — Progress Notes (Signed)
E-Visit for Vaginal Symptoms  We are sorry that you are not feeling well. Here is how we plan to help! Based on what you shared with me it looks like you: May have a yeast vaginosis  Vaginosis is an inflammation of the vagina that can result in discharge, itching and pain. The cause is usually a change in the normal balance of vaginal bacteria or an infection. Vaginosis can also result from reduced estrogen levels after menopause.  The most common causes of vaginosis are:   Bacterial vaginosis which results from an overgrowth of one on several organisms that are normally present in your vagina.   Yeast infections which are caused by a naturally occurring fungus called candida.   Vaginal atrophy (atrophic vaginosis) which results from the thinning of the vagina from reduced estrogen levels after menopause.   Trichomoniasis which is caused by a parasite and is commonly transmitted by sexual intercourse.  Factors that increase your risk of developing vaginosis include: Medications, such as antibiotics and steroids Uncontrolled diabetes Use of hygiene products such as bubble bath, vaginal spray or vaginal deodorant Douching Wearing damp or tight-fitting clothing Using an intrauterine device (IUD) for birth control Hormonal changes, such as those associated with pregnancy, birth control pills or menopause Sexual activity Having a sexually transmitted infection  Your treatment plan is Diflucan (fluconazole) '150mg'$  tablet once, may repeat in 72 hours if needed.  I have electronically sent this prescription into the pharmacy that you have chosen.  Be sure to take all of the medication as directed. Stop taking any medication if you develop a rash, tongue swelling or shortness of breath. Mothers who are breast feeding should consider pumping and discarding their breast milk while on these antibiotics. However, there is no consensus that infant exposure at these doses would be harmful.  Remember  that medication creams can weaken latex condoms. Marland Kitchen   HOME CARE:  Good hygiene may prevent some types of vaginosis from recurring and may relieve some symptoms:  Avoid baths, hot tubs and whirlpool spas. Rinse soap from your outer genital area after a shower, and dry the area well to prevent irritation. Don't use scented or harsh soaps, such as those with deodorant or antibacterial action. Avoid irritants. These include scented tampons and pads. Wipe from front to back after using the toilet. Doing so avoids spreading fecal bacteria to your vagina.  Other things that may help prevent vaginosis include:  Don't douche. Your vagina doesn't require cleansing other than normal bathing. Repetitive douching disrupts the normal organisms that reside in the vagina and can actually increase your risk of vaginal infection. Douching won't clear up a vaginal infection. Use a latex condom. Both female and female latex condoms may help you avoid infections spread by sexual contact. Wear cotton underwear. Also wear pantyhose with a cotton crotch. If you feel comfortable without it, skip wearing underwear to bed. Yeast thrives in [employer] Your symptoms should improve in the next day or two.  GET HELP RIGHT AWAY IF:  You have pain in your lower abdomen ( pelvic area or over your ovaries) You develop nausea or vomiting You develop a fever Your discharge changes or worsens You have persistent pain with intercourse You develop shortness of breath, a rapid pulse, or you faint.  These symptoms could be signs of problems or infections that need to be evaluated by a medical provider now.  MAKE SURE YOU   Understand these instructions. Will watch your condition. Will get help right  away if you are not doing well or get worse.  Thank you for choosing an e-visit.  Your e-visit answers were reviewed by a board certified advanced clinical practitioner to complete your personal care plan. Depending  upon the condition, your plan could have included both over the counter or prescription medications.  Please review your pharmacy choice. Make sure the pharmacy is open so you can pick up prescription now. If there is a problem, you may contact your provider through [employer] and have the prescription routed to another pharmacy.  Your safety is important to [place]. If you have drug allergies check your prescription carefully.   For the next 24 hours you can use MyChart to ask questions about today's visit, request a non-urgent call back, or ask for a work or school excuse. You will get an email in the next two days asking about your experience. I hope that your e-visit has been valuable and will speed your recovery.  I have spent 5 minutes in review of e-visit questionnaire, review and updating patient chart, medical decision making and response to patient.   Mar Daring, PA-C

## 2022-11-02 NOTE — Progress Notes (Unsigned)
Called office reporting persistent chest incision pain. Renewed her prescription for Dilaudid 2 mg but reduced the dose to one 2 mg tablet every 8 hours as needed severe pain.  #21 dispensed.  Macarthur Critchley, PA-C

## 2022-11-03 ENCOUNTER — Emergency Department (HOSPITAL_BASED_OUTPATIENT_CLINIC_OR_DEPARTMENT_OTHER)
Admission: EM | Admit: 2022-11-03 | Discharge: 2022-11-03 | Disposition: A | Discharge status: Home / Self Care | Payer: Medicaid Other | Attending: Emergency Medicine | Admitting: Emergency Medicine

## 2022-11-03 ENCOUNTER — Encounter (HOSPITAL_BASED_OUTPATIENT_CLINIC_OR_DEPARTMENT_OTHER): Payer: Self-pay | Admitting: Emergency Medicine

## 2022-11-03 ENCOUNTER — Other Ambulatory Visit: Payer: Self-pay

## 2022-11-03 DIAGNOSIS — R112 Nausea with vomiting, unspecified: Secondary | ICD-10-CM | POA: Diagnosis present

## 2022-11-03 DIAGNOSIS — T402X5A Adverse effect of other opioids, initial encounter: Secondary | ICD-10-CM | POA: Diagnosis not present

## 2022-11-03 DIAGNOSIS — R450 Nervousness: Secondary | ICD-10-CM | POA: Insufficient documentation

## 2022-11-03 DIAGNOSIS — T50905A Adverse effect of unspecified drugs, medicaments and biological substances, initial encounter: Secondary | ICD-10-CM

## 2022-11-03 LAB — MISC LABCORP TEST (SEND OUT): Labcorp test code: 9985

## 2022-11-03 MED ORDER — ONDANSETRON 4 MG PO TBDP
4.0000 mg | ORAL_TABLET | Freq: Once | ORAL | Status: AC
  Administered 2022-11-03: 4 mg via ORAL
  Filled 2022-11-03: qty 1

## 2022-11-03 NOTE — ED Triage Notes (Signed)
Pt reports she was given the wrong medication at the pharmacy. Pt reports after taking the medication she felt lightheaded and dizzy. Pt unsure what medication she took.

## 2022-11-03 NOTE — Discharge Instructions (Signed)
You have been monitored in the ED without any worsening of symptoms.  Please resume taking your normal medication as previously prescribed.  Return to the ER if you develop tongue swelling, lip swelling, trouble breathing, abdominal cramping, or if you have other concerns.

## 2022-11-03 NOTE — ED Provider Notes (Signed)
[facility] Provider Note   CSN: [id number] Arrival date & time: 11/03/22  1512     History  Chief Complaint  Patient presents with   Medication Reaction    Julie King is a 32 y.o. female.  The history is provided by the patient and medical records. No language interpreter was used.     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  Patient went to her pharmacy to get her Dilaudid refill this morning.  She was giving a bottle for which she took home, took 1 pill around 945 this morning and approximately an hour later she felt funny.  She described as feeling nauseous, vomited once, felt jittery and different from the sensation she normally gets when taking Dilaudid.  She inspected the pill bottle and also it listed the medication as hydromorphone 2 mg, the name of the patient was a different name and the drug prescription is also different as well.  There also several area that was marked out with black pen.  She can reach out to the pharmacy who request for the medication to be brought back to the facility.  Patient states her symptoms do persist prompting this ER visit.  Since wearing the ER, she did notice improvement of her symptoms.  She does not endorse any fever chills shortness of breath chest pain rash tongue swelling or trouble breathing.  Home Medications Prior to Admission medications   Medication Sig Start Date End Date Taking? Authorizing Provider  acetaminophen (TYLENOL) 500 MG tablet Take 1,000 mg by mouth 2 (two) times daily as needed for mild pain, moderate pain, headache or fever.    [provider]  albuterol (VENTOLIN HFA) 108 (90 Base) MCG/ACT inhaler Inhale 2 puffs into the lungs every 6 (six) hours as needed for wheezing or shortness of breath (Cough). 06/14/22   Lynden Oxford Scales, PA-C   amoxicillin-clavulanate (AUGMENTIN) 875-125 MG tablet Take 1 tablet by mouth every 8 (eight) hours for 22 days. 10/28/22 11/19/22  Dwyane Dee, MD  benzonatate (TESSALON) 200 MG capsule Take 1 capsule (200 mg total) by mouth 3 (three) times daily. 09/01/22   Minor, Grace Bushy, NP  doxycycline (VIBRA-TABS) 100 MG tablet Take 1 tablet (100 mg total) by mouth every 12 (twelve) hours for 22 days. 10/28/22 11/19/22  Dwyane Dee, MD  fluconazole (DIFLUCAN) 150 MG tablet Take 1 tablet (150 mg total) by mouth every 3 (three) days as needed. 11/02/22   Mar Daring, PA-C  gabapentin (NEURONTIN) 300 MG capsule Take 1 capsule (300 mg total) by mouth 3 (three) times daily. Patient taking differently: Take 300 mg by mouth 2 (two) times daily. 10/09/22   Gold, Wilder Glade, PA-C  HYDROmorphone (DILAUDID) 2 MG tablet Take 1 tablet (2 mg total) by mouth every 8 (eight) hours as needed for up to 7 days for severe pain. 11/02/22 11/09/22  Antony Odea, PA-C  ipratropium-albuterol (DUONEB) 0.5-2.5 (3) MG/3ML SOLN Take 3 mLs by nebulization 4 (four) times daily. Patient taking differently: Take 3 mLs by nebulization every 6 (six) hours as needed (shortness of breath). 09/01/22   Minor, Grace Bushy, NP  mometasone-formoterol (DULERA) 200-5 MCG/ACT AERO Inhale 2 puffs into the lungs 2 (two) times daily. Patient not taking: Reported on 10/14/2022 09/01/22   Minor, Grace Bushy, NP      Allergies    Percocet [oxycodone-acetaminophen] and Pineapple  Review of Systems   Review of Systems  All other systems reviewed and are negative.   Physical Exam Updated Vital Signs BP (!) 162/80   Pulse 92   Temp 98.4 F (36.9 C) (Oral)   Resp 18   SpO2 100%  Physical Exam Vitals and nursing note reviewed.  Constitutional:      General: She is not in acute distress.    Appearance: She is well-developed.  HENT:     Head: Atraumatic.     Mouth/Throat:     Mouth: Mucous membranes are moist.     Comments: No tongue swelling  no angioedema. Eyes:     Conjunctiva/sclera: Conjunctivae normal.  Cardiovascular:     Rate and Rhythm: Normal rate and regular rhythm.  Pulmonary:     Effort: Pulmonary effort is normal.     Comments: Decreased breath sounds without wheezes rales or rhonchi heard. Musculoskeletal:     Cervical back: Neck supple.  Skin:    Findings: No rash.  Neurological:     Mental Status: She is alert.  Psychiatric:        Mood and Affect: Mood normal.     ED Results / Procedures / Treatments   Labs (all labs ordered are listed, but only abnormal results are displayed) Labs Reviewed - No data to display  EKG EKG Interpretation  Date/Time:  Thursday November 03 2022 16:12:49 EST Ventricular Rate:  89 PR Interval:  128 QRS Duration: 99 QT Interval:  382 QTC Calculation: 465 R Axis:   45 Text Interpretation: Sinus rhythm Borderline T wave abnormalities since last tracing no significant change Confirmed by Malvin Johns [phone]) on 11/03/2022 5:13:24 PM  Radiology No results found.  Procedures Procedures    Medications Ordered in ED Medications  ondansetron (ZOFRAN-ODT) disintegrating tablet 4 mg (4 mg Oral Given 11/03/22 1611)    ED Course/ Medical Decision Making/ A&P                             Medical Decision Making Amount and/or Complexity of Data Reviewed ECG/medicine tests: ordered.  Risk Prescription drug management.   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  Patient went to her pharmacy to get her Dilaudid refill this morning.  She was giving a bottle for which she took home, took 1 pill around 945 this morning and approximately an hour later she felt funny.  She described as feeling nauseous, vomited once, felt jittery and different from the sensation she normally gets when  taking Dilaudid.  She inspected the pill bottle and also it listed the medication as hydromorphone 2 mg, the name of the patient was a different name and the drug prescription is also different as well.  There also several area that was marked out with black pen.  She can reach out to the pharmacy who request for the medication to be brought back to the facility.  Patient states her symptoms do persist prompting this ER visit.  Since wearing the ER, she did notice improvement of her symptoms.  She does not endorse any fever chills shortness of breath chest pain rash tongue swelling or trouble breathing.  On exam this is an obese female resting comfortably in  bed appears to be in no acute discomfort.  Heart with normal rate and rhythm, lungs with decreased breath sounds but no obvious wheezes, rales, or rhonchi heard.  No skin rash.  No tongue swelling or lip swelling.  No abdominal tenderness.  Vital signs reviewed and are remarkable for elevated blood pressure of 162/80.  Patient is afebrile no hypoxia.  DDX: medication reaction, anaphylaxis, anxiety  5:43 PM Patient has been monitored in the ED for the past 2 hours and 30 minutes without any worsening of symptoms.  At this time I felt patient is stable to be discharged home.  She may return if symptoms worsen.  Hospital admission considered but with improvement of symptoms patient is stable for discharge.        Final Clinical Impression(s) / ED Diagnoses Final diagnoses:  Medication reaction, initial encounter    Rx / DC Orders ED Discharge Orders     None         Domenic Moras, PA-C 11/03/22 1745    Malvin Johns, MD 11/03/22 1859

## 2022-11-07 ENCOUNTER — Other Ambulatory Visit (HOSPITAL_COMMUNITY): Payer: Self-pay | Admitting: Student

## 2022-11-07 LAB — FUNGUS CULTURE WITH STAIN

## 2022-11-07 LAB — FUNGUS CULTURE RESULT

## 2022-11-07 LAB — FUNGAL ORGANISM REFLEX

## 2022-11-10 ENCOUNTER — Ambulatory Visit: Payer: Medicaid Other | Admitting: Pulmonary Disease

## 2022-11-10 ENCOUNTER — Ambulatory Visit (INDEPENDENT_AMBULATORY_CARE_PROVIDER_SITE_OTHER): Payer: Medicaid Other | Admitting: Nurse Practitioner

## 2022-11-10 ENCOUNTER — Encounter: Payer: Self-pay | Admitting: Nurse Practitioner

## 2022-11-10 ENCOUNTER — Ambulatory Visit (INDEPENDENT_AMBULATORY_CARE_PROVIDER_SITE_OTHER): Payer: Medicaid Other

## 2022-11-10 VITALS — BP 132/84 | HR 97 | Ht 67.0 in | Wt 316.5 lb

## 2022-11-10 DIAGNOSIS — R918 Other nonspecific abnormal finding of lung field: Secondary | ICD-10-CM | POA: Diagnosis not present

## 2022-11-10 DIAGNOSIS — J90 Pleural effusion, not elsewhere classified: Secondary | ICD-10-CM

## 2022-11-10 NOTE — Assessment & Plan Note (Signed)
Persistent effusion on imaging but overall slowly improving. She likely has some associated pleurisy. We discussed possible course of steroids but given she is improving on her own, we will hold for now. Recommended OTC NSAID use. Side effect profile reviewed. She understands to call if pain does not continue to improve or if respiratory symptoms worsen. See above plan.

## 2022-11-10 NOTE — Patient Instructions (Addendum)
Continue Albuterol inhaler 2 puffs or 3 mL neb every 6 hours as needed for shortness of breath or wheezing. Notify if symptoms persist despite rescue inhaler/neb use.  Continue dulera 2 puffs Twice daily. Brush tongue and rinse mouth afterwards Continue augmentin three times a day until complete. Take with food Continue doxycycline 1 tab Twice daily until complete. Take with food and large glass of water. Wear sunscreen when outside as this can increase risk for hyperpigmentation/burns  Start daily probiotic while on antibiotics Call infectious disease about your diarrhea. I am concerned you could have contracted an infection called c diff due to your antibiotic use. They will need to obtain a stool specimen   Try over the counter aleve or ibuprofen for pain. Take with food. Let me know if the discomfort doesn't continue to improve   I will call you if there is any new information from the radiologist regarding your chest x ray   Follow up in 4 weeks with Dr. Silas Flood. If symptoms do not improve or worsen, please contact office for sooner follow up or seek emergency care.

## 2022-11-10 NOTE — Progress Notes (Signed)
$'@Patient'O$  ID: Julie King, female    DOB: [birth year], 32 y.o.   MRN: [id number]  Chief Complaint  Patient presents with   Follow-up    Pt HFU for a pleural effusion she verbalized that her breathing is slightly better her incisions is still painful. Denies anymore hemoptysis     Referring provider: Songer, Alda Berthold, MD  HPI: 32 year old female, never smoker followed for lung mass.   Mass was initially found and worked up at [facility].  She had a robotic biopsy there in November 2023 which was negative for malignancy.  She was followed with expectant management.  She presented to [facility] ED in December 2023 with hemoptysis and CT imaging showed increase size of the lung last to 3 cm.  She underwent repeat bronchoscopy and biopsy, again with similar results of no malignancy, just inflammatory cells.  She was then referred to TCTS and ultimately had VATS with wedge resection of lingular mass on 2/8 with Dr. Roxan Hockey.  Pathology negative for malignancy and showed inflammation with focal necrosis, fibrosis and pneumocyte hyperplasia.   She initially did well but then around 2/14, began to experiencing worsening chest pain that radiated to her back along with subjective fevers, productive cough of gray-black sputum, nausea and orthopnea.  She re-presented to [facility] ED on 2/15 and CTA showed a small moderate left pleural effusion.  Suspected that this was hemothorax versus infection, high suspicion for infected space given appearance as well as strep on lung cultures from resection.  Suspect infection was resected.  Infectious disease, PCCM and TCTS were all consulted.  She underwent repeat left-sided VATS, drainage of pleural effusion and decortication on 10/21/2022 with Dr. Roxan Hockey.  She was treated with IV vancomycin and cefepime with plans to transition to oral antibiotics for 4 weeks (total being 6 weeks).  She had clinical improvement and was discharged home on 3/1 with Augmentin 875 3 times  daily and doxycycline 100 mg twice daily. Plans to follow up with ID 3/18.   TEST/EVENTS:  09/05/2022 PFT: FVC 93, FEV1 85, ratio 81, TLC 94, DLCOcor 122. No BD 10/05/2022 PET: 3.5x3.5 cm mass with central cavitation. Intensely hypermetabolic. Single hypermetabolic left mediastinal/hilar node, 8 mm. Hypermetaboloic cutaneous skin thickening in the right groin, favored a benign cutaneous infection or inflammation  10/14/2022 CTA chest: no evidence of PE. Worsening opacity at lung bases and moderate left effusion. Postoperative left chest.   11/10/2022: Today - follow up Patient presents today for hospital follow up. She is doing better. Still having some discomfort in her upper back with deep breaths or certain movements. She is having some dyspnea with more strenuous activities or when the pain hits, but overall, this has improved. She denies any cough, recurrent hemoptysis, fevers, chills, night sweats. She is eating and drinking well. She is taking her antibiotics; has missed a few days but trying to be more diligent with this. She did start having multiple watery bowel movements over the past week. No bloody stools, abd pain, N/V. She has an appointment with ID and CT surgery on Monday.   Allergies  Allergen Reactions   Percocet [Oxycodone-Acetaminophen] Hives, Itching and Nausea Only   Pineapple Itching    Fresh pineapples; pineapples from a can is okay    Immunization History  Administered Date(s) Administered   Influenza Split 06/03/2022   Tdap 12/26/2019    Past Medical History:  Diagnosis Date   Atrial fibrillation ([place]) [id number]   Complication of anesthesia  wakes up emotional   Family history of adverse reaction to anesthesia    mother has n & v   Family history of breast cancer    Headache 07/25/2017   Hypertension    Irritable bowel syndrome    Morbidly obese ([place]) 07/25/2017   PONV (postoperative nausea and vomiting)    QT prolongation    T wave inversion in EKG      Tobacco History: Social History   Tobacco Use  Smoking Status Former   Packs/day: 0.50   Years: 10.00   Additional pack years: 0.00   Total pack years: 5.00   Types: Cigarettes  Smokeless Tobacco Never  Tobacco Comments   Quit smoking on 10/05/22 -- 11/10/2022 [facility]   Counseling given: Not Answered Tobacco comments: Quit smoking on 10/05/22 -- 11/10/2022 [facility]   Outpatient Medications Prior to Visit  Medication Sig Dispense Refill   acetaminophen (TYLENOL) 500 MG tablet Take 1,000 mg by mouth 2 (two) times daily as needed for mild pain, moderate pain, headache or fever.     albuterol (VENTOLIN HFA) 108 (90 Base) MCG/ACT inhaler Inhale 2 puffs into the lungs every 6 (six) hours as needed for wheezing or shortness of breath (Cough). 18 g 5   amoxicillin-clavulanate (AUGMENTIN) 875-125 MG tablet Take 1 tablet by mouth every 8 (eight) hours for 22 days. 66 tablet 0   benzonatate (TESSALON) 200 MG capsule Take 1 capsule (200 mg total) by mouth 3 (three) times daily. 20 capsule 0   doxycycline (VIBRA-TABS) 100 MG tablet Take 1 tablet (100 mg total) by mouth every 12 (twelve) hours for 22 days. 44 tablet 0   fluconazole (DIFLUCAN) 150 MG tablet Take 1 tablet (150 mg total) by mouth every 3 (three) days as needed. 2 tablet 0   gabapentin (NEURONTIN) 300 MG capsule Take 1 capsule (300 mg total) by mouth 3 (three) times daily. (Patient taking differently: Take 300 mg by mouth 2 (two) times daily.) 90 capsule 1   ipratropium-albuterol (DUONEB) 0.5-2.5 (3) MG/3ML SOLN Take 3 mLs by nebulization 4 (four) times daily. (Patient taking differently: Take 3 mLs by nebulization every 6 (six) hours as needed (shortness of breath).) 360 mL 1   mometasone-formoterol (DULERA) 200-5 MCG/ACT AERO Inhale 2 puffs into the lungs 2 (two) times daily. 1 each 3   No facility-administered medications prior to visit.     Review of Systems:   Constitutional: No weight loss or gain, night sweats, fevers, chills, or  lassitude. +fatigue (improving) HEENT: No headaches, difficulty swallowing, tooth/dental problems, or sore throat. No sneezing, itching, ear ache, nasal congestion, or post nasal drip CV:  No chest pain, orthopnea, PND, swelling in lower extremities, anasarca, dizziness, palpitations, syncope Resp: +shortness of breath with exertion (improving); pleuritic pain (improving). No excess mucus or change in color of mucus. No productive or non-productive. No hemoptysis. No wheezing.  No chest wall deformity GI:  +diarrhea. No heartburn, indigestion, abdominal pain, nausea, vomiting, change in bowel habits, loss of appetite, bloody stools.  GU: No dysuria, change in color of urine, urgency or frequency.  Skin: No rash, lesions, ulcerations MSK:  No joint pain or swelling.  Neuro: No dizziness or lightheadedness.  Psych: No depression or anxiety. Mood stable.     Physical Exam:  BP 132/84   Pulse 97   Ht '5\' 7"'$  (1.702 m)   Wt (!) 316 lb 8 oz (143.6 kg)   SpO2 100%   BMI 49.57 kg/m   GEN: Pleasant, interactive, well-appearing; morbidly  obese; in no acute distress HEENT:  Normocephalic and atraumatic. PERRLA. Sclera white. Nasal turbinates pink, moist and patent bilaterally. No rhinorrhea present. Oropharynx pink and moist, without exudate or edema. No lesions, ulcerations, or postnasal drip.  NECK:  Supple w/ fair ROM. No JVD present. Normal carotid impulses w/o bruits. Thyroid symmetrical with no goiter or nodules palpated. No lymphadenopathy.   CV: RRR, no m/r/g, no peripheral edema. Pulses intact, +2 bilaterally. No cyanosis, pallor or clubbing. PULMONARY:  Unlabored, regular breathing. Diminished left lung posteriorly otherwise clear bilaterally A&P w/o wheezes/rales/rhonchi. No accessory muscle use.  GI: BS present and normoactive. Soft, non-tender to palpation. No organomegaly or masses detected.  MSK: No erythema, warmth or tenderness. Cap refil <2 sec all extrem. No deformities or joint  swelling noted.  Neuro: A/Ox3. No focal deficits noted.   Skin: Warm, no lesions or rashe Psych: Normal affect and behavior. Judgement and thought content appropriate.     Lab Results:  CBC    Component Value Date/Time   WBC 11.6 (H) 10/27/2022 0419   RBC 4.33 10/27/2022 0419   HGB 7.9 (L) 10/27/2022 0419   HGB CANCELED 01/12/2022 1506   HCT 25.9 (L) 10/27/2022 0419   HCT CANCELED 01/12/2022 1506   PLT 353 10/27/2022 0419   PLT CANCELED 01/12/2022 1506   MCV 59.8 (L) 10/27/2022 0419   MCH 18.2 (L) 10/27/2022 0419   MCHC 30.5 10/27/2022 0419   RDW 23.7 (H) 10/27/2022 0419   LYMPHSABS 2.0 10/25/2022 0600   LYMPHSABS CANCELED 01/12/2022 1506   MONOABS 0.5 10/25/2022 0600   EOSABS 0.5 10/25/2022 0600   EOSABS CANCELED 01/12/2022 1506   BASOSABS 0.1 10/25/2022 0600   BASOSABS CANCELED 01/12/2022 1506    BMET    Component Value Date/Time   NA 137 10/26/2022 0519   NA 142 01/12/2022 1506   K 4.1 10/26/2022 0519   CL 103 10/26/2022 0519   CO2 23 10/26/2022 0519   GLUCOSE 83 10/26/2022 0519   BUN 8 10/26/2022 0519   BUN 10 01/12/2022 1506   CREATININE 0.63 10/26/2022 0519   CALCIUM 8.9 10/26/2022 0519   GFRNONAA >60 10/26/2022 0519   GFRAA >60 02/04/2020 1820    BNP    Component Value Date/Time   BNP 52.8 10/14/2022 0229     Imaging:  DG Chest 2 View  Result Date: 11/10/2022 CLINICAL DATA:  Pleural effusion follow up. EXAM: CHEST - 2 VIEW COMPARISON:  Chest radiographs 10/28/2022 and 10/27/2022; CT chest 10/14/2022 FINDINGS: Interval removal of the prior right internal jugular central venous catheter. Cardiac silhouette and mediastinal contours are unchanged and within normal limits. Right lung is clear. No significant change in left midlung horizontal linear opacity adjacent to surgical sutures from prior wedge resection. No significant change in mild-to-moderate left pleural effusion. Mildly improved adjacent left basilar lung aeration with persistent mild  heterogeneous airspace opacity. No definite extrapleural air is visualized. No acute skeletal abnormality. IMPRESSION: 1. No significant change in mild-to-moderate left pleural effusion. 2. Mildly improved adjacent left basilar lung aeration with persistent mild heterogeneous airspace opacity, likely atelectasis. Electronically Signed   By: Yvonne Kendall M.D.   On: 11/10/2022 11:53   DG Chest 2 View  Result Date: 10/28/2022 CLINICAL DATA:  Pleural effusion. EXAM: CHEST - 2 VIEW COMPARISON:  Multiple priors including most recent chest radiograph October 27, 2022 FINDINGS: Right IJ CVC with tip near the superior cavoatrial junction. The heart size and mediastinal contours are partially obscured but unchanged. Similar layering moderate  left hydropneumothorax with adjacent airspace consolidation. Right lung is clear. The visualized skeletal structures are unchanged. IMPRESSION: Similar layering moderate left hydropneumothorax with adjacent airspace consolidation. Electronically Signed   By: Dahlia Bailiff M.D.   On: 10/28/2022 08:18   DG Chest 2 View  Result Date: 10/27/2022 CLINICAL DATA:  LEFT pleural effusion EXAM: CHEST - 2 VIEW COMPARISON:  10/26/2022 FINDINGS: RIGHT jugular line stable tip projecting over SVC. Upper normal heart size. Mediastinal contours and pulmonary vascularity normal. Persistent LEFT pleural effusion and basilar atelectasis. Small loculated hydropneumothorax mid to lower LEFT chest unchanged. RIGHT lung remains clear. Broad-based dextroconvex thoracic scoliosis. IMPRESSION: Persistent LEFT pleural effusion and basilar atelectasis. Persistent small loculated LEFT hydropneumothorax mid to lower LEFT chest. Electronically Signed   By: Lavonia Dana M.D.   On: 10/27/2022 08:56   DG Chest 2 View  Result Date: 10/26/2022 CLINICAL DATA:  Pleural effusion EXAM: CHEST - 2 VIEW COMPARISON:  Multiple chest x-rays and a CT scan of the chest since October 14, 2022. FINDINGS: An air-fluid level  in the lateral left lung base has likely been present since October 21, 2022 and is nonspecific but probably a small hydropneumothorax developing after chest tube placement. There does appear to be a small left-sided pneumothorax with apical and components. The effusion does remain but appears smaller. The left chest tube is been removed. An opacity underlies the effusion in the left base. No right-sided pneumothorax. The right lung is clear. The cardiomediastinal silhouette is stable. The right central line terminates in the central SVC. No other interval changes. IMPRESSION: 1. A small left-sided pneumothorax is identified after chest tube removal. Recommend attention on close follow-up. 2. An air-fluid level in the lateral left lung base is nonspecific but could represent a small hydropneumothorax developing after chest tube placement. 3. The left effusion is smaller. 4. Underlying opacity in the left base could represent atelectasis or infiltrate. 5. No other interval changes. These results will be called to the ordering clinician or representative by the Radiologist Assistant, and communication documented in the PACS or [employer]. Electronically Signed   By: Dorise Bullion III M.D.   On: 10/26/2022 08:39   DG CHEST PORT 1 VIEW  Result Date: 10/25/2022 CLINICAL DATA:  Pleural effusion EXAM: PORTABLE CHEST 1 VIEW COMPARISON:  Portable exam 0546 hours compared to 10/24/2022 FINDINGS: RIGHT jugular line with tip projecting over SVC. LEFT thoracostomy tube, second thoracostomy tube removed in interval since prior study. Enlargement of cardiac silhouette with stable mediastinal contours. Persistent volume loss in LEFT hemithorax with elevation of diaphragm. Persistent small LEFT pleural effusion and LEFT basilar atelectasis. Improved atelectasis RIGHT base. No pneumothorax. IMPRESSION: LEFT chest tube with persistent small LEFT pleural effusion and basilar atelectasis. Electronically Signed   By: Lavonia Dana M.D.   On: 10/25/2022 08:26   DG CHEST PORT 1 VIEW  Result Date: 10/24/2022 CLINICAL DATA:  Left chest tube EXAM: PORTABLE CHEST 1 VIEW COMPARISON:  10/23/2022 FINDINGS: Right internal jugular central line is unchanged with its tip in the SVC at the azygos level. Two left chest tubes remain in place. No pleural air. Abnormal density persists in the left chest consistent with a combination of residual pleural fluid and pulmonary atelectasis or infiltrate. Mild volume loss seen at the right lung base. IMPRESSION: No significant change. No pleural air. Persistent abnormal density in the left chest consistent with a combination of pleural fluid and pulmonary atelectasis or infiltrate. Electronically Signed   By: Jan Fireman.D.  On: 10/24/2022 08:03   DG CHEST PORT 1 VIEW  Result Date: 10/23/2022 CLINICAL DATA:  Pleural effusion EXAM: PORTABLE CHEST 1 VIEW COMPARISON:  10/22/2022 FINDINGS: Left-sided chest tubes are stable in positioning. Right IJ central venous catheter terminates at the level of the distal SVC. Stable heart size. Low lung volumes with elevation of the left hemidiaphragm. Persistent small left pleural effusion. Left greater than right bibasilar opacities. No pneumothorax. IMPRESSION: Persistent small left pleural effusion with left greater than right bibasilar opacities, not significantly changed from prior. No pneumothorax. Electronically Signed   By: Davina Poke D.O.   On: 10/23/2022 10:57   DG Chest Port 1 View  Result Date: 10/22/2022 CLINICAL DATA:  Follow-up left pneumothorax. EXAM: PORTABLE CHEST 1 VIEW COMPARISON:  10/21/2022 FINDINGS: Two left-sided chest tubes remain in place as well as right jugular central venous catheter. No pneumothorax is visualized. Left lung subsegmental atelectasis is again seen as well as probable tiny left pleural effusion. Minimal atelectasis noted in the right lung base, also without change. IMPRESSION: No pneumothorax visualized. Left  chest tubes remain in place. Stable left lung subsegmental atelectasis and probable tiny left pleural effusion. Electronically Signed   By: Marlaine Hind M.D.   On: 10/22/2022 09:07   DG Chest Port 1 View  Result Date: 10/21/2022 CLINICAL DATA:  Pneumothorax. Right central line. EXAM: PORTABLE CHEST 1 VIEW COMPARISON:  Radiographs yesterday. CT 10/14/2022 FINDINGS: Two left-sided chest tubes have been placed. Decreased left pleural effusion with residual [place] opacity in the left mid lower lung zone, likely residual fusion. No convincing pneumothorax. There is subcutaneous emphysema in the left lateral chest wall. Right internal jugular central venous catheter tip overlies the mid SVC. There is no right pneumothorax. Ill-defined opacity at the right lung base. No large right pleural effusion. Grossly stable heart size and mediastinal contours. IMPRESSION: 1. Placement of 2 left-sided chest tubes with decreased left pleural effusion. Residual hazy opacity in the left mid and lower lung zone. No convincing pneumothorax. Subcutaneous emphysema in the left lateral chest wall. 2. Right internal jugular central venous catheter tip overlies the SVC. No right-sided pneumothorax. 3. Ill-defined opacity at the right lung base, similar to recent CT. Electronically Signed   By: Keith Rake M.D.   On: 10/21/2022 17:38   DG Chest 2 View  Result Date: 10/20/2022 CLINICAL DATA:  Pleural effusion EXAM: CHEST - 2 VIEW COMPARISON:  None Available. FINDINGS: Enlarged cardiac silhouette. The LEFT heart border is obscured by large LEFT pleural effusion. No change in effusion volume. Central venous congestion in the RIGHT lung. No pneumothorax. IMPRESSION: 1. No change in large LEFT pleural effusion. 2. Central venous congestion. 3. No interval change. Electronically Signed   By: Suzy Bouchard M.D.   On: 10/20/2022 08:32   DG Chest 2 View  Result Date: 10/19/2022 CLINICAL DATA:  32 year old female with left pleural  effusion. EXAM: CHEST - 2 VIEW COMPARISON:  10/18/2022, 10/14/2022 FINDINGS: Similar appearing mild rightward mediastinal shift with obscuration of the left aspect of the cardiomediastinal silhouette, unchanged. Similar appearance of large left pleural effusion. Similar to slightly worsening diffuse hazy bibasilar opacities. No acute osseous abnormality. IMPRESSION: 1. Similar appearing large left pleural effusion with associated left basilar atelectasis. 2. Slight interval increased hazy opacities about the right lower lobe as could be seen with worsening pneumonia, atelectasis, edema, or technical factors due to positioning. Electronically Signed   By: Ruthann Cancer M.D.   On: 10/19/2022 07:56   DG CHEST PORT  1 VIEW  Result Date: 10/18/2022 CLINICAL DATA:  Pneumonia EXAM: PORTABLE CHEST 1 VIEW COMPARISON:  10/17/2022, CT 10/14/2022 FINDINGS: Residual moderate large left effusion appears slightly increased. Worsened airspace disease at left lung base. Slightly improved aeration of right thorax. Obscured cardiomediastinal silhouette. No pneumothorax IMPRESSION: Slightly increased moderate to large left effusion with worsened airspace disease at the left lung base. Slightly improved aeration of the right thorax. Electronically Signed   By: Donavan Foil M.D.   On: 10/18/2022 17:05   IR THORACENTESIS ASP PLEURAL SPACE W/IMG GUIDE  Result Date: 10/17/2022 INDICATION: Patient s/p left VATS and wedge resection 10/06/22. Patient presents today with a left pleural effusion. Interventional radiology asked to perform a diagnostic and therapeutic thoracentesis. EXAM: ULTRASOUND GUIDED THORACENTESIS MEDICATIONS: 1% lidocaine 20 mL COMPLICATIONS: SIR LEVEL B - Normal therapy, CBC/labs ordered. Spleen mistaken for pleural effusion initially. PROCEDURE: An ultrasound guided thoracentesis was thoroughly discussed with the patient and questions answered. The benefits, risks, alternatives and complications were also discussed.  The patient understands and wishes to proceed with the procedure. Written consent was obtained. Ultrasound was performed to localize and mark an adequate pocket of fluid in the left chest. The area was then prepped and draped in the normal sterile fashion. 1% Lidocaine was used for local anesthesia. Under ultrasound guidance a 19 gauge 10-cm Yueh catheter was introduced inadvertently into the spleen. The catheter was removed. Ultrasound evaluation with color doppler demonstrated no evidence of surrounding hematoma or other complicating features. A more superior location in the left posterior thorax was selected for thoracentesis. Subdermal Local anesthesia was applied with 1% lidocaine. Deeper local anesthetic was administered ultrasound guidance adjacent to the pleura. Under direct ultrasound visualization, a 10 cm Yueh needle was directed into the pleural space. Thoracentesis was performed. The catheter was removed and a dressing applied. FINDINGS: A total of approximately 500 ml of clear yellow fluid was removed. Samples were sent to the laboratory as requested by the clinical team. IMPRESSION: Successful ultrasound guided left thoracentesis yielding 500 mL of pleural fluid. Read by: Soyla Dryer, NP Electronically Signed   By: Ruthann Cancer M.D.   On: 10/17/2022 13:50   DG Chest 1 View  Result Date: 10/17/2022 CLINICAL DATA:  [id number] S/P thoracentesis [id number] EXAM: CHEST  1 VIEW COMPARISON:  Radiograph 10/17/2022 FINDINGS: Unchanged cardiomegaly. Decreased but persistent left pleural effusion after thoracentesis. No evidence of pneumothorax. Slight improved aeration in the left lower lung. Persistent right lower lung airspace disease. Bones are unchanged. IMPRESSION: Decreased left pleural effusion and slight improved aeration in the left lower lung after thoracentesis. No evidence of pneumothorax. Persistent right lower lung airspace disease. Electronically Signed   By: Maurine Simmering M.D.   On: 10/17/2022  12:55   DG CHEST PORT 1 VIEW  Result Date: 10/17/2022 CLINICAL DATA:  Pneumonia EXAM: PORTABLE CHEST 1 VIEW COMPARISON:  10/14/2022 FINDINGS: Stable heart size. Large left pleural effusion, increased. Worsening bibasilar airspace opacities. Probable small right pleural effusion. No pneumothorax. IMPRESSION: 1. Large left pleural effusion, increased. 2. Worsening bibasilar airspace opacities. Electronically Signed   By: Davina Poke D.O.   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  Comparison Study: No prior study on file Performing Technologist: Sharion Dove RVS  Examination Guidelines: A complete evaluation includes B-mode imaging, spectral Doppler, color Doppler, and power Doppler as needed of all accessible portions of each vessel. Bilateral testing is considered an integral part of a complete examination. Limited examinations for reoccurring indications may be performed as noted. The reflux portion of the exam is performed with the patient in reverse Trendelenburg.  +---------+---------------+---------+-----------+----------+--------------+ RIGHT    CompressibilityPhasicitySpontaneityPropertiesThrombus Aging +---------+---------------+---------+-----------+----------+--------------+ CFV      Full           Yes      Yes                                 +---------+---------------+---------+-----------+----------+--------------+ SFJ      Full                                                         +---------+---------------+---------+-----------+----------+--------------+ FV Prox  Full                                                        +---------+---------------+---------+-----------+----------+--------------+ FV Mid   Full                                                        +---------+---------------+---------+-----------+----------+--------------+ FV DistalFull                                                        +---------+---------------+---------+-----------+----------+--------------+ PFV      Full                                                        +---------+---------------+---------+-----------+----------+--------------+ POP      Full           Yes      Yes                                 +---------+---------------+---------+-----------+----------+--------------+ PTV      Full                                                        +---------+---------------+---------+-----------+----------+--------------+ PERO  Full                                                        +---------+---------------+---------+-----------+----------+--------------+   +---------+---------------+---------+-----------+----------+--------------+ LEFT     CompressibilityPhasicitySpontaneityPropertiesThrombus Aging +---------+---------------+---------+-----------+----------+--------------+ CFV      Full           Yes      Yes                                 +---------+---------------+---------+-----------+----------+--------------+ SFJ      Full                                                        +---------+---------------+---------+-----------+----------+--------------+ FV Prox  Full                                                        +---------+---------------+---------+-----------+----------+--------------+ FV Mid   Full                                                         +---------+---------------+---------+-----------+----------+--------------+ FV DistalFull                                                        +---------+---------------+---------+-----------+----------+--------------+ PFV      Full                                                        +---------+---------------+---------+-----------+----------+--------------+ POP      Full           Yes      Yes                                 +---------+---------------+---------+-----------+----------+--------------+ PTV      Full                                                        +---------+---------------+---------+-----------+----------+--------------+ PERO     Full                                                        +---------+---------------+---------+-----------+----------+--------------+  Summary: BILATERAL: - No evidence of deep vein thrombosis seen in the lower extremities, bilaterally. -No evidence of popliteal cyst, bilaterally.   *See table(s) above for measurements and observations. Electronically signed by Deitra Mayo MD on 10/15/2022 at 6:50:33 PM.    Final    CT Angio Chest PE W and/or Wo Contrast  Result Date: 10/14/2022 CLINICAL DATA:  Pulmonary embolism suspected, high probability EXAM: CT ANGIOGRAPHY CHEST WITH CONTRAST TECHNIQUE: Multidetector CT imaging of the chest was performed using the standard protocol during bolus administration of intravenous contrast. Multiplanar CT image reconstructions and MIPs were obtained to evaluate the vascular anatomy. RADIATION DOSE REDUCTION: This exam was performed according to the departmental dose-optimization program which includes automated exposure control, adjustment of the mA and/or kV according to patient size and/or use of iterative reconstruction technique. CONTRAST:  56m OMNIPAQUE IOHEXOL 350 MG/ML SOLN COMPARISON:  Yesterday FINDINGS: Cardiovascular: Satisfactory pulmonary artery timing but limited  detail of subsegmental vessels due to body habitus primarily. There is also complicating motion artifact. No visible pulmonary emboli. Normal heart size without pericardial effusion. Normal aorta. Mediastinum/Nodes: Negative for adenopathy or mass Lungs/Pleura: Worsening opacity at the lung bases, especially on the left with volume loss. Moderate left pleural effusion. No pneumothorax. Upper Abdomen: No acute finding. Musculoskeletal: No acute finding Review of the MIP images confirms the above findings. IMPRESSION: 1. No visible pulmonary embolism. Suboptimal peripheral vessel evaluation due to motion and body habitus. 2. Postoperative left chest with worsening atelectasis and moderate left pleural effusion. Electronically Signed   By: JJorje GuildM.D.   On: 10/14/2022 06:28   DG Chest Portable 1 View  Result Date: 10/14/2022 CLINICAL DATA:  Dyspnea EXAM: PORTABLE CHEST 1 VIEW COMPARISON:  10/13/2022 FINDINGS: Lung volumes are small. Left basilar consolidation and small left pleural effusion again identified with slight interval increase in left pleural fluid. Right basilar discoid atelectasis again noted. No pneumothorax. No pleural effusion on the right. Cardiac size is at the upper limits of normal. IMPRESSION: 1. Slight interval increase in left pleural fluid. Stable left basilar consolidation. 2. Pulmonary hypoinflation. Electronically Signed   By: AFidela SalisburyM.D.   On: 10/14/2022 02:30   CT Angio Chest PE W/Cm &/Or Wo Cm  Result Date: 10/13/2022 CLINICAL DATA:  Status post left VATS and wedge resection of lung nodule involving upper and lower lobes left lung. EXAM: CT ANGIOGRAPHY CHEST WITH CONTRAST TECHNIQUE: Multidetector CT imaging of the chest was performed using the standard protocol during bolus administration of intravenous contrast. Multiplanar CT image reconstructions and MIPs were obtained to evaluate the vascular anatomy. RADIATION DOSE REDUCTION: This exam was performed according  to the departmental dose-optimization program which includes automated exposure control, adjustment of the mA and/or kV according to patient size and/or use of iterative reconstruction technique. CONTRAST:  876mOMNIPAQUE IOHEXOL 350 MG/ML SOLN COMPARISON:  Chest CTA 09/30/2022 FINDINGS: Cardiovascular: Heart size upper normal. No thoracic aortic aneurysm. No large central pulmonary embolus in the main pulmonary arteries or lobar/segmental pulmonary arteries to either lung. Subsegmental pulmonary arteries in the lower lobes are not reliably evaluated due to motion artifact. Mediastinum/Nodes: No mediastinal lymphadenopathy. No hilar lymphadenopathy. The esophagus has normal imaging features. There is no axillary lymphadenopathy. Lungs/Pleura: Subsegmental atelectasis noted right lower lobe. There is patchy collapse/consolidation in the posterior lingula and anterior left lower lobe, in proximity to the staple line from wedge resection. Dependent atelectasis noted in the posterior left lower lobe. No right pleural effusion with small to moderate left pleural  effusion evident including some fluid probably loculated in the anterior pleural space (image 191/7). Upper Abdomen: Visualized portion of the upper abdomen is unremarkable. Musculoskeletal: No worrisome lytic or sclerotic osseous abnormality. Review of the MIP images confirms the above findings. IMPRESSION: 1. No large central pulmonary embolus in the main pulmonary arteries or lobar/segmental pulmonary arteries to either lung. Subsegmental pulmonary arteries in the lower lobes are not reliably evaluated due to motion artifact. 2. Patchy collapse/consolidation in the posterior lingula and anterior left lower lobe, in proximity to the staple line from recent wedge resection. 3. Subsegmental atelectasis noted right lower lobe and dependent left lower lobe. 4. Small to moderate left pleural effusion including some fluid probably loculated in the anterior pleural  space. Electronically Signed   By: Misty Stanley M.D.   On: 10/13/2022 18:06   DG Chest Port 1 View  Result Date: 10/13/2022 CLINICAL DATA:  Chest pain, status post left wedge resection EXAM: PORTABLE CHEST 1 VIEW COMPARISON:  10/08/2022 FINDINGS: Interval removal of a previously seen right neck vascular catheter. The heart size and mediastinal contours are within normal limits. Heterogeneous airspace opacity of the left lung base with elevation of the left hemidiaphragm. Bandlike scarring or atelectasis of the right lung base. The visualized skeletal structures are unremarkable. IMPRESSION: 1. Heterogeneous airspace opacity of the left lung base with elevation of the left hemidiaphragm, appearance in keeping with reported left wedge resection. 2. Bandlike scarring or atelectasis of the right lung base. Electronically Signed   By: Delanna Ahmadi M.D.   On: 10/13/2022 14:21         Latest Ref Rng & Units 09/05/2022    1:22 PM 09/10/2018    3:00 PM  PFT Results  FVC-Pre L 3.82  4.07  P  FVC-Predicted Pre % 93  113  P  FVC-Post L 3.83  3.36  P  FVC-Predicted Post % 93  93  P  Pre FEV1/FVC % % 76  77  P  Post FEV1/FCV % % 81  83  P  FEV1-Pre L 2.91  3.14  P  FEV1-Predicted Pre % 85  102  P  FEV1-Post L 3.11  2.78  P  DLCO uncorrected ml/min/mmHg 24.54  27.28  P  DLCO UNC% % 100  96  P  DLCO corrected ml/min/mmHg 29.82    DLCO COR %Predicted % 122    DLVA Predicted % 139  101  P  TLC L 5.12  8.15  P  TLC % Predicted % 94  148  P  RV % Predicted % 103  288  P    P Preliminary result    No results found for: "NITRICOXIDE"      Assessment & Plan:   Lung mass Previously followed with expectant management at [facility] following negative robotic biopsy in November 2023. Presented to [facility] ED in December due to hemoptysis and CT imaging revealed increased size. She had an additional bronchoscopy that was negative for malignancy, only inflammatory cells. Autoimmune workup was negative; HIV  negative. She then had PET scan 2/7, highly metabolic cavitary lung mass, followed by VATS with wedge resection 10/06/2022. Presented to ED on 2/14 and found to have loculated effusion, felt to be infectious due to strep on lung cultures from resection. She did not improve so underwent repeat VATS, drainage of pleural fluid and decortication on 2/23 with TCTS. She was started on IV abx and transitioned oral augmentin and doxycycline upon discharge to complete 6 week course. She is  clinically improving with improved aeration on imaging today. Plan to repeat at follow up. Strict return/ED precautions. Continue mucociliary clearance therapies. She has missed a few doses of her abx; reviewed the importance of compliance with these and recommended she obtain a pill box to help her keep up with these. She has developed watery BM's - concerned for possible c diff vs adverse reaction to abx. Advised her to contact ID to collect stool samples. Recommended she start probiotic in the interim and push fluids.   Patient Instructions  Continue Albuterol inhaler 2 puffs or 3 mL neb every 6 hours as needed for shortness of breath or wheezing. Notify if symptoms persist despite rescue inhaler/neb use.  Continue dulera 2 puffs Twice daily. Brush tongue and rinse mouth afterwards Continue augmentin three times a day until complete. Take with food Continue doxycycline 1 tab Twice daily until complete. Take with food and large glass of water. Wear sunscreen when outside as this can increase risk for hyperpigmentation/burns  Start daily probiotic while on antibiotics Call infectious disease about your diarrhea. I am concerned you could have contracted an infection called c diff due to your antibiotic use. They will need to obtain a stool specimen   Try over the counter aleve or ibuprofen for pain. Take with food. Let me know if the discomfort doesn't continue to improve   I will call you if there is any new information from  the radiologist regarding your chest x ray   Follow up in 4 weeks with Dr. Silas Flood. If symptoms do not improve or worsen, please contact office for sooner follow up or seek emergency care.    Pleural effusion Persistent effusion on imaging but overall slowly improving. She likely has some associated pleurisy. We discussed possible course of steroids but given she is improving on her own, we will hold for now. Recommended OTC NSAID use. Side effect profile reviewed. She understands to call if pain does not continue to improve or if respiratory symptoms worsen. See above plan.    I spent 45 minutes of dedicated to the care of this patient on the date of this encounter to include pre-visit review of records, face-to-face time with the patient discussing conditions above, post visit ordering of testing, clinical documentation with the electronic health record, making appropriate referrals as documented, and communicating necessary findings to members of the patients care team.  Clayton Bibles, NP 11/10/2022  Pt aware and understands NP's role.

## 2022-11-10 NOTE — Assessment & Plan Note (Addendum)
Previously followed with expectant management at [facility] following negative robotic biopsy in November 2023. Presented to [facility] ED in December due to hemoptysis and CT imaging revealed increased size. She had an additional bronchoscopy that was negative for malignancy, only inflammatory cells. Autoimmune workup was negative; HIV negative. She then had PET scan 2/7, highly metabolic cavitary lung mass, followed by VATS with wedge resection 10/06/2022. Presented to ED on 2/14 and found to have loculated effusion, felt to be infectious due to strep on lung cultures from resection. She did not improve so underwent repeat VATS, drainage of pleural fluid and decortication on 2/23 with TCTS. She was started on IV abx and transitioned oral augmentin and doxycycline upon discharge to complete 6 week course. She is clinically improving with improved aeration on imaging today. Plan to repeat at follow up. Strict return/ED precautions. Continue mucociliary clearance therapies. She has missed a few doses of her abx; reviewed the importance of compliance with these and recommended she obtain a pill box to help her keep up with these. She has developed watery BM's - concerned for possible c diff vs adverse reaction to abx. Advised her to contact ID to collect stool samples. Recommended she start probiotic in the interim and push fluids.   Patient Instructions  Continue Albuterol inhaler 2 puffs or 3 mL neb every 6 hours as needed for shortness of breath or wheezing. Notify if symptoms persist despite rescue inhaler/neb use.  Continue dulera 2 puffs Twice daily. Brush tongue and rinse mouth afterwards Continue augmentin three times a day until complete. Take with food Continue doxycycline 1 tab Twice daily until complete. Take with food and large glass of water. Wear sunscreen when outside as this can increase risk for hyperpigmentation/burns  Start daily probiotic while on antibiotics Call infectious disease about your  diarrhea. I am concerned you could have contracted an infection called c diff due to your antibiotic use. They will need to obtain a stool specimen   Try over the counter aleve or ibuprofen for pain. Take with food. Let me know if the discomfort doesn't continue to improve   I will call you if there is any new information from the radiologist regarding your chest x ray   Follow up in 4 weeks with Dr. Silas Flood. If symptoms do not improve or worsen, please contact office for sooner follow up or seek emergency care.

## 2022-11-14 ENCOUNTER — Ambulatory Visit: Payer: Medicaid Other | Admitting: Thoracic Surgery (Cardiothoracic Vascular Surgery)

## 2022-11-14 ENCOUNTER — Encounter: Payer: Self-pay | Admitting: Internal Medicine

## 2022-11-14 ENCOUNTER — Other Ambulatory Visit: Payer: Self-pay

## 2022-11-14 ENCOUNTER — Other Ambulatory Visit: Payer: Self-pay | Admitting: Thoracic Surgery (Cardiothoracic Vascular Surgery)

## 2022-11-14 ENCOUNTER — Ambulatory Visit (INDEPENDENT_AMBULATORY_CARE_PROVIDER_SITE_OTHER): Payer: Medicaid Other | Admitting: Internal Medicine

## 2022-11-14 VITALS — BP 153/91 | HR 90 | Temp 97.7°F | Ht 67.0 in | Wt 318.0 lb

## 2022-11-14 DIAGNOSIS — R197 Diarrhea, unspecified: Secondary | ICD-10-CM

## 2022-11-14 DIAGNOSIS — J869 Pyothorax without fistula: Secondary | ICD-10-CM

## 2022-11-14 NOTE — Progress Notes (Addendum)
Patient: Julie King  DOB: 18-Sep-1990 MRN: [id number] PCP: Hattie Perch, MD    Patient Active Problem List   Diagnosis Date Noted   Pneumonia 10/15/2022   Healthcare-associated pneumonia 10/14/2022   Pleural effusion 10/14/2022   Microcytic hypochromic anemia 10/14/2022   Tobacco abuse 10/14/2022   Mass of left lung 10/06/2022   Hemoptysis 08/28/2022   Lung mass 08/28/2022   Family history of breast cancer    Essential hypertension 03/16/2020   History of atrial fibrillation 11/28/2019   Headache 07/25/2017   Morbidly obese ([place]) 07/25/2017     Subjective:  Julie King is a 32 year old female with past medical history as below including tobacco abuse presents for hospital follow-up of 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  She was admitted 2/16 - 3/1 after presenting with hemoptysis.  CT showed high-grade activity in lingular nodule and moderate hilar node.  Underwent bronchoscopy/VATS/wedge resection on 2/8.  Path showed focal necrosis, fibrosis, pneumocyte hyperplasia.  No malignancy.  She states that she had chest pain that worsened around Super Bowl Sunday.  Presented with temp 100.6, WBC 15.9 K.  Started on Vanco and cefepime.  Sputum thoracentesis cultures were negative.  Tissue cultures are growing rare staph epi which is likely contaminant.  She was discharged on Augmentin and Doxy to complete 4 weeks of antibiotics and ID follow-up. CTS: Appt with Dr Roxan Hockey tomorrow Today 11/14/22: Pt stoopeed taking abx 10 days ago. She reports she had 4 watery stools a day since discharge, with  abdominal chaest.No  fever, chills, abdominal pain.  Review of Systems  All other systems reviewed and are negative.   Past Medical History:  Diagnosis Date   Atrial fibrillation ([place]) [id number]   Complication of anesthesia    wakes up emotional   Family history of adverse reaction to anesthesia    mother has n & v   Family history of  breast cancer    Headache 07/25/2017   Hypertension    Irritable bowel syndrome    Morbidly obese ([place]) 07/25/2017   PONV (postoperative nausea and vomiting)    QT prolongation    T wave inversion in EKG     Outpatient Medications Prior to Visit  Medication Sig Dispense Refill   acetaminophen (TYLENOL) 500 MG tablet Take 1,000 mg by mouth 2 (two) times daily as needed for mild pain, moderate pain, headache or fever.     albuterol (VENTOLIN HFA) 108 (90 Base) MCG/ACT inhaler Inhale 2 puffs into the lungs every 6 (six) hours as needed for wheezing or shortness of breath (Cough). 18 g 5   amoxicillin-clavulanate (AUGMENTIN) 875-125 MG tablet Take 1 tablet by mouth every 8 (eight) hours for 22 days. 66 tablet 0   benzonatate (TESSALON) 200 MG capsule Take 1 capsule (200 mg total) by mouth 3 (three) times daily. 20 capsule 0   doxycycline (VIBRA-TABS) 100 MG tablet Take 1 tablet (100 mg total) by mouth every 12 (twelve) hours for 22 days. 44 tablet 0   fluconazole (DIFLUCAN) 150 MG tablet Take 1 tablet (150 mg total) by mouth every 3 (three) days as needed. 2 tablet 0   gabapentin (NEURONTIN) 300 MG capsule Take 1 capsule (300 mg total) by mouth 3 (three) times daily. (Patient taking differently: Take 300 mg by mouth 2 (two) times daily.) 90 capsule 1   ipratropium-albuterol (DUONEB) 0.5-2.5 (3) MG/3ML SOLN Take 3 mLs by nebulization 4 (four) times daily. (Patient taking differently:  Take 3 mLs by nebulization every 6 (six) hours as needed (shortness of breath).) 360 mL 1   mometasone-formoterol (DULERA) 200-5 MCG/ACT AERO Inhale 2 puffs into the lungs 2 (two) times daily. 1 each 3   No facility-administered medications prior to visit.     Allergies  Allergen Reactions   Percocet [Oxycodone-Acetaminophen] Hives, Itching and Nausea Only   Pineapple Itching    Fresh pineapples; pineapples from a can is okay    Social History   Tobacco Use   Smoking status: Former    Packs/day: 0.50     Years: 10.00    Additional pack years: 0.00    Total pack years: 5.00    Types: Cigarettes   Smokeless tobacco: Never   Tobacco comments:    Quit smoking on 10/05/22 -- 11/10/2022 [facility]  Vaping Use   Vaping Use: Never used  Substance Use Topics   Alcohol use: Yes    Comment: rarely   Drug use: No    Family History  Problem Relation Age of Onset   Hypertension Other    Diabetes Other    Crohn's disease Mother    Hypertension Mother    Thyroid disease Father    Diabetes Father    Breast cancer Maternal Aunt 28   Breast cancer Paternal Grandmother        dx under 68   Breast cancer Other 71       MGMs sister, currently has stage IV    Objective:  There were no vitals filed for this visit. There is no height or weight on file to calculate BMI.  Physical Exam Constitutional:      Appearance: Normal appearance.  HENT:     Head: Normocephalic and atraumatic.     Right Ear: Tympanic membrane normal.     Left Ear: Tympanic membrane normal.     Nose: Nose normal.     Mouth/Throat:     Mouth: Mucous membranes are moist.  Eyes:     Extraocular Movements: Extraocular movements intact.     Conjunctiva/sclera: Conjunctivae normal.     Pupils: Pupils are equal, round, and reactive to light.  Cardiovascular:     Rate and Rhythm: Normal rate and regular rhythm.     Heart sounds: No murmur heard.    No friction rub. No gallop.  Pulmonary:     Effort: Pulmonary effort is normal.     Breath sounds: Normal breath sounds.  Abdominal:     General: Abdomen is flat.     Palpations: Abdomen is soft.  Musculoskeletal:        General: Normal range of motion.  Skin:    General: Skin is warm and dry.  Neurological:     General: No focal deficit present.     Mental Status: She is alert and oriented to person, place, and time.  Psychiatric:        Mood and Affect: Mood normal.     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 Admitted 2/16-3/01 with pulmonary abscess complicated by empyema, peel. She was initially started on Vancomycin and cefepime. Underwent thora on 2/19 with NG and resp sputum Cx on 2/17 NG. Underwent VATS(CTS) on 2/23 cx NG except for MRSE which was likely contaminant(doxy Sens). -ID engaged and pt discharged on Augmetin tid(BMI based) and doxy. She stopped taking antibiotics a few days after discharge. She states she had trouble remembering to take them. Also note 4 watery stools a day since discharge. -Seen by Fatima Sanger Marland Kitchen NP on 3/14). X-ray on 3/14 showed mildly improved left basilar lung aeration, persistent mild heterogenous airspace opacity likely atelectasis. Noted to be overall improving with likley associated pleurisy. -Today she had no respiratory symptoms Plan: -Labs today -Continue abx(Augmenitn q 8 hours+ doxy 100mg  pO bid) pending c diff results while awaiting CT results -CTS appt tomorrow -F/U in 3 weeks   Laurice Record, MD [facility] [place]   I have personally spent 97 minutes involved in face-to-face and non-face-to-face activities for this patient on the day of the visit. Professional time spent includes the following activities: Preparing to see the patient (review of tests), Obtaining and/or reviewing separately obtained history (admission/discharge record),  Performing a medically appropriate examination and/or evaluation , Ordering medications/tests/procedures, referring and communicating with other health care professionals, Documenting clinical information in the EMR, Independently interpreting results (not separately reported), Communicating results to the patient/family/caregiver, Counseling and educating the patient/family/caregiver and Care coordination (not separately reported).    11/14/22  8:37 AM

## 2022-11-15 ENCOUNTER — Other Ambulatory Visit: Payer: Self-pay

## 2022-11-15 ENCOUNTER — Ambulatory Visit (INDEPENDENT_AMBULATORY_CARE_PROVIDER_SITE_OTHER): Payer: Self-pay | Admitting: Thoracic Surgery (Cardiothoracic Vascular Surgery)

## 2022-11-15 ENCOUNTER — Ambulatory Visit
Admission: RE | Admit: 2022-11-15 | Discharge: 2022-11-15 | Disposition: A | Discharge status: Home / Self Care | Payer: Medicaid Other | Source: Ambulatory Visit | Attending: Thoracic Surgery (Cardiothoracic Vascular Surgery) | Admitting: Thoracic Surgery (Cardiothoracic Vascular Surgery)

## 2022-11-15 ENCOUNTER — Other Ambulatory Visit: Payer: Medicaid Other

## 2022-11-15 ENCOUNTER — Encounter: Payer: Self-pay | Admitting: Thoracic Surgery (Cardiothoracic Vascular Surgery)

## 2022-11-15 VITALS — BP 137/94 | HR 101 | Resp 20 | Ht 67.0 in | Wt 319.0 lb

## 2022-11-15 DIAGNOSIS — Z9889 Other specified postprocedural states: Secondary | ICD-10-CM

## 2022-11-15 DIAGNOSIS — R197 Diarrhea, unspecified: Secondary | ICD-10-CM

## 2022-11-15 DIAGNOSIS — J869 Pyothorax without fistula: Secondary | ICD-10-CM

## 2022-11-15 DIAGNOSIS — J90 Pleural effusion, not elsewhere classified: Secondary | ICD-10-CM

## 2022-11-15 LAB — COMPLETE METABOLIC PANEL WITH GFR
AG Ratio: 1.2 (calc) (ref 1.0–2.5)
ALT: 15 U/L (ref 6–29)
AST: 10 U/L (ref 10–30)
Albumin: 4.2 g/dL (ref 3.6–5.1)
Alkaline phosphatase (APISO): 87 U/L (ref 31–125)
BUN: 7 mg/dL (ref 7–25)
CO2: 24 mmol/L (ref 20–32)
Calcium: 9.5 mg/dL (ref 8.6–10.2)
Chloride: 107 mmol/L (ref 98–110)
Creat: 0.6 mg/dL (ref 0.50–0.97)
Globulin: 3.6 g/dL (calc) (ref 1.9–3.7)
Glucose, Bld: 89 mg/dL (ref 65–99)
Potassium: 4 mmol/L (ref 3.5–5.3)
Sodium: 141 mmol/L (ref 135–146)
Total Bilirubin: 0.3 mg/dL (ref 0.2–1.2)
Total Protein: 7.8 g/dL (ref 6.1–8.1)
eGFR: 123 mL/min/{1.73_m2} (ref >=60)

## 2022-11-15 LAB — FUNGUS CULTURE WITH STAIN

## 2022-11-15 LAB — CBC WITH DIFFERENTIAL/PLATELET
Absolute Monocytes: 568 cells/uL (ref 200–950)
Basophils Absolute: 29 cells/uL (ref 0–200)
Basophils Relative: 0.3 %
Eosinophils Absolute: 265 cells/uL (ref 15–500)
Eosinophils Relative: 2.7 %
HCT: 37.7 % (ref 35.0–45.0)
Hemoglobin: 10.7 g/dL — ABNORMAL LOW (ref 11.7–15.5)
Lymphs Abs: 2773 cells/uL (ref 850–3900)
MCH: 17.4 pg — ABNORMAL LOW (ref 27.0–33.0)
MCHC: 28.4 g/dL — ABNORMAL LOW (ref 32.0–36.0)
MCV: 61.3 fL — ABNORMAL LOW (ref 80.0–100.0)
Monocytes Relative: 5.8 %
Neutro Abs: 6164 cells/uL (ref 1500–7800)
Neutrophils Relative %: 62.9 %
Platelets: 341 10*3/uL (ref 140–400)
RBC: 6.15 10*6/uL — ABNORMAL HIGH (ref 3.80–5.10)
RDW: 24.3 % — ABNORMAL HIGH (ref 11.0–15.0)
Total Lymphocyte: 28.3 %
WBC: 9.8 10*3/uL (ref 3.8–10.8)

## 2022-11-15 LAB — SEDIMENTATION RATE: Sed Rate: 11 mm/h (ref 0–20)

## 2022-11-15 LAB — FUNGUS CULTURE RESULT

## 2022-11-15 LAB — FUNGAL ORGANISM REFLEX

## 2022-11-15 LAB — CBC MORPHOLOGY

## 2022-11-15 LAB — C-REACTIVE PROTEIN: CRP: 12.6 mg/L — ABNORMAL HIGH (ref <8.0)

## 2022-11-15 NOTE — Progress Notes (Signed)
[address]       [place] [address]             [phone]      HPI: Julie King returns for follow-up after recent surgeries.  Julie King is a 32 year old woman with a history of tobacco abuse, morbid obesity, paroxysmal atrial fibrillation, hypertension, lung abscess, and pneumonia complicated by empyema.  She presented with hemoptysis and shortness of breath in October.  She was initially evaluated at [facility] showed a left lung mass and PET/CT showed the mass was hypermetabolic and there also was a hypermetabolic lymph node in the hilum.  She underwent bronchoscopy twice which showed only inflammation.  She continued to have hemoptysis.  She presented to the emergency room with recurrent hemoptysis and was admitted.  CT findings were suspicious for a malignancy.  She underwent another bronchoscopy which showed inflammation.  She was treated empirically with antibiotics during this timeframe.  Ultimately she underwent a robotic assisted wedge resection on 10/06/2022.  The nodule turned out to be an abscess and grew strep intermedius.  She presented back to the emergency room a couple of weeks postoperatively with increasing pleuritic chest pain and was found to have pneumonia with a parapneumonic effusion/empyema.  She required a VATS for drainage and decortication on 10/21/2022.  Cultures of the fluid came back negative.  She went home on postoperative day #8.  She had a lot of pain initially.  She got her prescription for Dilaudid refill but says they gave her the wrong dose and she stopped taking it.  She currently is taking gabapentin 300 mg 3 times a day.  Has not used narcotics in a week or 2.  Occasionally feels short of breath but it usually resolves within very brief period time.  She has been on Augmentin but that is currently on hold as she is being worked up for possible C. difficile colitis.  Past Medical History:  Diagnosis Date   Atrial  fibrillation ([place]) [id number]   Complication of anesthesia    wakes up emotional   Family history of adverse reaction to anesthesia    mother has n & v   Family history of breast cancer    Headache 07/25/2017   Hypertension    Irritable bowel syndrome    Morbidly obese ([place]) 07/25/2017   PONV (postoperative nausea and vomiting)    QT prolongation    T wave inversion in EKG     Current Outpatient Medications  Medication Sig Dispense Refill   acetaminophen (TYLENOL) 500 MG tablet Take 1,000 mg by mouth 2 (two) times daily as needed for mild pain, moderate pain, headache or fever.     albuterol (VENTOLIN HFA) 108 (90 Base) MCG/ACT inhaler Inhale 2 puffs into the lungs every 6 (six) hours as needed for wheezing or shortness of breath (Cough). 18 g 5   benzonatate (TESSALON) 200 MG capsule Take 1 capsule (200 mg total) by mouth 3 (three) times daily. 20 capsule 0   doxycycline (VIBRA-TABS) 100 MG tablet Take 1 tablet (100 mg total) by mouth every 12 (twelve) hours for 22 days. 44 tablet 0   gabapentin (NEURONTIN) 300 MG capsule Take 1 capsule (300 mg total) by mouth 3 (three) times daily. (Patient taking differently: Take 300 mg by mouth 2 (two) times daily.) 90 capsule 1   ipratropium-albuterol (DUONEB) 0.5-2.5 (3) MG/3ML SOLN Take 3 mLs by nebulization 4 (four) times daily. (Patient taking differently: Take 3  mLs by nebulization every 6 (six) hours as needed (shortness of breath).) 360 mL 1   mometasone-formoterol (DULERA) 200-5 MCG/ACT AERO Inhale 2 puffs into the lungs 2 (two) times daily. 1 each 3   nicotine (NICODERM CQ - DOSED IN MG/24 HOURS) 14 mg/24hr patch Place onto the skin.     amoxicillin-clavulanate (AUGMENTIN) 875-125 MG tablet Take 1 tablet by mouth every 8 (eight) hours for 22 days. (Patient not taking: Reported on 11/15/2022) 66 tablet 0   fluconazole (DIFLUCAN) 150 MG tablet Take 1 tablet (150 mg total) by mouth every 3 (three) days as needed. (Patient not taking: Reported on  11/15/2022) 2 tablet 0   No current facility-administered medications for this visit.    Physical Exam BP (!) 137/94 (BP Location: Right Arm, Patient Position: Sitting)   Pulse (!) 101   Resp 20   Ht 5\' 7"  (1.702 m)   Wt (!) 319 lb (144.7 kg)   SpO2 98% Comment: RA  BMI 49.33 kg/m  32 year old woman in no acute distress Alert and oriented x 3 with no focal deficits Incisions clean dry and intact Lungs diminished at left base but otherwise clear  Diagnostic Tests: I personally reviewed her chest x-ray images which show postoperative changes on the left.  Overall excellent result post decortication.  Impression: Julie King is a 32 year old woman with a history of tobacco abuse, morbid obesity, paroxysmal atrial fibrillation, hypertension, lung abscess, and pneumonia complicated by empyema.  Lung abscess-strep intermedius.  Resected.  Treated with antibiotics.  Pneumonia complicated by empyema postoperatively-required VATS for drainage and decortication.  Excellent radiographic result although obviously not a normal appearing chest x-ray.  Again being treated with long-term oral antibiotics but on hold for workup of C. difficile.  Postoperative pain-currently on gabapentin 300 mg 3 times daily.  Not taking narcotics at this point.  Advised her to stay on the gabapentin for couple more weeks and then if she would like she could drop it down to 300 mg twice daily.  If her pain remains stable over the next couple of weeks then she can drop down to 300 mg once daily.  Cautioned her not to stop the medication all at once.  Plan: Return in 2 months with PA and lateral chest x-ray  Melrose Nakayama, MD [facility] [phone]

## 2022-11-15 NOTE — Addendum Note (Signed)
Addended by: Caffie Pinto on: 11/15/2022 09:44 AM   Modules accepted: Orders

## 2022-11-17 LAB — GASTROINTESTINAL PATHOGEN PNL
CampyloBacter Group: NOT DETECTED
Norovirus GI/GII: NOT DETECTED
Rotavirus A: NOT DETECTED
Salmonella species: NOT DETECTED
Shiga Toxin 1: NOT DETECTED
Shiga Toxin 2: NOT DETECTED
Shigella Species: NOT DETECTED
Vibrio Group: NOT DETECTED
Yersinia enterocolitica: NOT DETECTED

## 2022-11-17 LAB — C. DIFFICILE GDH AND TOXIN A/B
GDH ANTIGEN: NOT DETECTED
MICRO NUMBER:: 14716091
SPECIMEN QUALITY:: ADEQUATE
TOXIN A AND B: NOT DETECTED

## 2022-11-19 LAB — ACID FAST CULTURE WITH REFLEXED SENSITIVITIES (MYCOBACTERIA): Acid Fast Culture: NEGATIVE

## 2022-11-23 ENCOUNTER — Telehealth: Payer: Self-pay

## 2022-11-23 NOTE — Telephone Encounter (Signed)
STD form completed and faxed to [facility] @ [phone]. Beginning LOA 09/28/22 through approx. 01/23/23./ DOS 10/06/22 and 10/16/22. ( Pulm is keeping this patient out of work til 1/31 and then [facility] told me he will take over and keep her out from 1/31 til recovered.  I will send her a letter with that.  She is working on her STD and [employer] and will send soon. )

## 2022-11-30 ENCOUNTER — Ambulatory Visit (HOSPITAL_COMMUNITY): Payer: Medicaid Other | Attending: Internal Medicine

## 2022-12-06 LAB — ACID FAST CULTURE WITH REFLEXED SENSITIVITIES (MYCOBACTERIA)
Acid Fast Culture: NEGATIVE
Acid Fast Culture: NEGATIVE
Acid Fast Culture: NEGATIVE

## 2022-12-08 ENCOUNTER — Ambulatory Visit: Payer: Medicaid Other | Admitting: Pulmonary Disease

## 2022-12-12 NOTE — Progress Notes (Signed)
RTW note in mychart/

## 2023-01-16 ENCOUNTER — Other Ambulatory Visit: Payer: Self-pay | Admitting: Thoracic Surgery (Cardiothoracic Vascular Surgery)

## 2023-01-16 DIAGNOSIS — J90 Pleural effusion, not elsewhere classified: Secondary | ICD-10-CM

## 2023-01-17 ENCOUNTER — Ambulatory Visit: Payer: Medicaid Other | Admitting: Thoracic Surgery (Cardiothoracic Vascular Surgery)

## 2023-01-17 ENCOUNTER — Telehealth: Payer: Self-pay | Admitting: Gastroenterology

## 2023-01-17 ENCOUNTER — Encounter: Payer: Self-pay | Admitting: Thoracic Surgery (Cardiothoracic Vascular Surgery)

## 2023-01-17 ENCOUNTER — Telehealth: Payer: Medicaid Other | Admitting: Physician Assistant

## 2023-01-17 DIAGNOSIS — K644 Residual hemorrhoidal skin tags: Secondary | ICD-10-CM

## 2023-01-17 MED ORDER — HYDROCORTISONE ACETATE 25 MG RE SUPP: 25.0000 mg | Freq: Two times a day (BID) | RECTAL | 0 refills | Status: DC

## 2023-01-17 NOTE — Telephone Encounter (Signed)
Good morning Dr. Marlene Lard Provider 01/17/23 AM    We received a call from this patient, wishing to schedule an appointment for rectal bleeding. This patient has history with [facility] in 2022, had a telemedicine appointment. Patient since then has changed insurances which they now are not in network with. Records from previous consultation are in Care Everywhere for your review. Would you please advise on scheduling?  Thank you.

## 2023-01-17 NOTE — Progress Notes (Signed)

## 2023-01-17 NOTE — Telephone Encounter (Signed)
Next available new patient with any provider-physician or APP   - HD

## 2023-01-19 ENCOUNTER — Encounter: Payer: Self-pay | Admitting: Physician Assistant

## 2023-01-20 ENCOUNTER — Ambulatory Visit (HOSPITAL_COMMUNITY): Payer: Medicaid Other

## 2023-01-26 ENCOUNTER — Telehealth: Payer: Medicaid Other | Admitting: Nurse Practitioner

## 2023-01-26 DIAGNOSIS — J029 Acute pharyngitis, unspecified: Secondary | ICD-10-CM

## 2023-01-26 NOTE — Progress Notes (Signed)
  E-Visit for Sore Throat  We are sorry that you are not feeling well.  Here is how we plan to help!  Your symptoms indicate a likely viral infection (Pharyngitis).   Pharyngitis is inflammation in the back of the throat which can cause a sore throat, scratchiness and sometimes difficulty swallowing.   Pharyngitis is typically caused by a respiratory virus and will just run its course.  Please keep in mind that your symptoms could last up to 10 days.  For throat pain, we recommend over the counter oral pain relief medications such as acetaminophen or aspirin, or anti-inflammatory medications such as ibuprofen or naproxen sodium.  Topical treatments such as oral throat lozenges or sprays may be used as needed.  Avoid close contact with loved ones, especially the very young and elderly.  Remember to wash your hands thoroughly throughout the day as this is the number one way to prevent the spread of infection and wipe down door knobs and counters with disinfectant.  After careful review of your answers, I would not recommend an antibiotic for your condition.  Antibiotics should not be used to treat conditions that we suspect are caused by viruses like the virus that causes the common cold or flu. However, some people can have Strep with atypical symptoms. You may need formal testing in clinic or office to confirm if your symptoms continue or worsen.  Providers prescribe antibiotics to treat infections caused by bacteria. Antibiotics are very powerful in treating bacterial infections when they are used properly.  To maintain their effectiveness, they should be used only when necessary.  Overuse of antibiotics has resulted in the development of super bugs that are resistant to treatment!    Home Care: Only take medications as instructed by your medical team. Do not drink alcohol while taking these medications. A steam or ultrasonic humidifier can help congestion.  You can place a towel over your head and  breathe in the steam from hot water coming from a faucet. Avoid close contacts especially the very young and the elderly. Cover your mouth when you cough or sneeze. Always remember to wash your hands.  Get Help Right Away If: You develop worsening fever or throat pain. You develop a severe head ache or visual changes. Your symptoms persist after you have completed your treatment plan.  Make sure you Understand these instructions. Will watch your condition. Will get help right away if you are not doing well or get worse.   Thank you for choosing an e-visit.  Your e-visit answers were reviewed by a board certified advanced clinical practitioner to complete your personal care plan. Depending upon the condition, your plan could have included both over the counter or prescription medications.  Please review your pharmacy choice. Make sure the pharmacy is open so you can pick up prescription now. If there is a problem, you may contact your provider through MyChart messaging and have the prescription routed to another pharmacy.  Your safety is important to us. If you have drug allergies check your prescription carefully.   For the next 24 hours you can use MyChart to ask questions about today's visit, request a non-urgent call back, or ask for a work or school excuse. You will get an email in the next two days asking about your experience. I hope that your e-visit has been valuable and will speed your recovery.   I spent approximately 5 minutes reviewing the patient's history, current symptoms and coordinating their care today.   

## 2023-02-09 ENCOUNTER — Ambulatory Visit (HOSPITAL_COMMUNITY): Admission: RE | Admit: 2023-02-09 | Payer: Medicaid Other | Source: Ambulatory Visit

## 2023-03-07 ENCOUNTER — Ambulatory Visit
Admission: EM | Admit: 2023-03-07 | Discharge: 2023-03-07 | Disposition: A | Discharge status: Home / Self Care | Payer: Medicaid Other | Attending: Internal Medicine | Admitting: Internal Medicine

## 2023-03-07 ENCOUNTER — Ambulatory Visit (INDEPENDENT_AMBULATORY_CARE_PROVIDER_SITE_OTHER): Payer: Medicaid Other

## 2023-03-07 DIAGNOSIS — R051 Acute cough: Secondary | ICD-10-CM

## 2023-03-07 NOTE — ED Triage Notes (Signed)
Pt presents with c/o a cough. States she had a mass removed from her lung in February.   States she is consistently coughing and has a rash.

## 2023-03-07 NOTE — ED Provider Notes (Signed)
[facility]    CSN: [id number] Arrival date & time: 03/07/23  4098      History   Chief Complaint Chief Complaint  Patient presents with   Cough    HPI Julie King is a 32 y.o. female presents for evaluation of cough.  Patient reports 1 week of a persistent dry cough.  She denies any shortness of breath, congestion, fevers or chills, ear pain, body aches, shortness of breath.  No hemoptysis.  No asthma history but she does have a smoking history.  Skin October 2023 she presented to an ER with hemoptysis and shortness of breath.  CT showed a left lung mass.  She underwent a bronchoscopy twice which only showed inflammation.  She was initially admitted to the hospital after persistent hemoptysis and additional CT testing was suspicious for malignancy.  She was treated with antibiotics at that time.  On October 06, 2022 she underwent went a robotic assisted wedge resection of the mass which turned out to be an abscess that grew strep intermedius.  She represented back to the emergency room a couple weeks after this with increasing pleuritic chest pain and was diagnosed with pneumonia with a parapneumonic effusion/empyema.  She required VATS for drainage and decortication on 10/21/2022.  She was admitted for 8 days and finally discharged.  She reports the cough she is currently presenting with is similar to when she had the other symptoms.  She also states she has a rash on her left breast which she also had prior to the mass/abscess/empyema.  She states the rash resolved once her symptoms of that resolved.  She contacted her pulmonologist who states they are not able to see her until August and recommend she go to urgent care.  She has not been taking any OTC medications for her symptoms.  No other concerns at this time   Cough   Past Medical History:  Diagnosis Date   Atrial fibrillation ([facility]) 2020   Complication of anesthesia    wakes up emotional   Family history of adverse  reaction to anesthesia    mother has n & v   Family history of breast cancer    Headache 07/25/2017   Hypertension    Irritable bowel syndrome    Morbidly obese ([facility]) 07/25/2017   PONV (postoperative nausea and vomiting)    QT prolongation    T wave inversion in EKG     Patient Active Problem List   Diagnosis Date Noted   Pneumonia 10/15/2022   Healthcare-associated pneumonia 10/14/2022   Pleural effusion 10/14/2022   Microcytic hypochromic anemia 10/14/2022   Tobacco abuse 10/14/2022   Mass of left lung 10/06/2022   Hemoptysis 08/28/2022   Lung mass 08/28/2022   Family history of breast cancer    Essential hypertension 03/16/2020   History of atrial fibrillation 11/28/2019   Headache 07/25/2017   Morbidly obese ([facility]) 07/25/2017    Past Surgical History:  Procedure Laterality Date   BRONCHIAL BIOPSY  08/30/2022   Procedure: BRONCHIAL BIOPSIES;  Surgeon: Josephine Igo, DO;  Location: [facility];  Service: Pulmonary;;   BRONCHIAL BRUSHINGS  08/30/2022   Procedure: BRONCHIAL BRUSHINGS;  Surgeon: Josephine Igo, DO;  Location: [facility];  Service: Pulmonary;;   BRONCHIAL NEEDLE ASPIRATION BIOPSY  08/30/2022   Procedure: BRONCHIAL NEEDLE ASPIRATION BIOPSIES;  Surgeon: Josephine Igo, DO;  Location: [facility];  Service: Pulmonary;;   CESAREAN SECTION     CESAREAN SECTION N/A 01/31/2020   Procedure: CESAREAN  SECTION;  Surgeon: Hermina Staggers, MD;  Location: [facility];  Service: Obstetrics;  Laterality: N/A;   CHOLECYSTECTOMY     DECORTICATION Left 10/21/2022   Procedure: DECORTICATION;  Surgeon: Loreli Slot, MD;  Location[facility];  Service: Thoracic;  Laterality: Left;   HERNIA REPAIR     INTERCOSTAL NERVE BLOCK Left 10/06/2022   Procedure: INTERCOSTAL NERVE BLOCK;  Surgeon: Loreli Slot, MD;  Location: [facility];  Service: Thoracic;  Laterality: Left;   IR THORACENTESIS ASP PLEURAL SPACE W/IMG GUIDE  10/17/2022   PLEURAL EFFUSION DRAINAGE Left 10/21/2022    Procedure: DRAINAGE OF PLEURAL EFFUSION;  Surgeon: Loreli Slot, MD;  Location: [facility] OR;  Service: Thoracic;  Laterality: Left;   TONSILLECTOMY     VIDEO ASSISTED THORACOSCOPY Left 10/21/2022   Procedure: VIDEO ASSISTED THORACOSCOPY;  Surgeon: Loreli Slot, MD;  Location: [facility] OR;  Service: Thoracic;  Laterality: Left;   VIDEO BRONCHOSCOPY WITH ENDOBRONCHIAL ULTRASOUND N/A 10/06/2022   Procedure: VIDEO BRONCHOSCOPY WITH ENDOBRONCHIAL ULTRASOUND;  Surgeon: Loreli Slot, MD;  Location: [facility] OR;  Service: Thoracic;  Laterality: N/A;    OB History     Gravida  2   Para  2   Term  1   Preterm  1   AB      Living  2      SAB      IAB      Ectopic      Multiple  0   Live Births  2            Home Medications    Prior to Admission medications   Medication Sig Start Date End Date Taking? Authorizing Provider  acetaminophen (TYLENOL) 500 MG tablet Take 1,000 mg by mouth 2 (two) times daily as needed for mild pain, moderate pain, headache or fever.    [provider]  albuterol (VENTOLIN HFA) 108 (90 Base) MCG/ACT inhaler Inhale 2 puffs into the lungs every 6 (six) hours as needed for wheezing or shortness of breath (Cough). 06/14/22   Theadora Rama Scales, PA-C  benzonatate (TESSALON) 200 MG capsule Take 1 capsule (200 mg total) by mouth 3 (three) times daily. 09/01/22   Minor, Vilinda Blanks, NP  fluconazole (DIFLUCAN) 150 MG tablet Take 1 tablet (150 mg total) by mouth every 3 (three) days as needed. Patient not taking: Reported on 11/15/2022 11/02/22   Margaretann Loveless, PA-C  gabapentin (NEURONTIN) 300 MG capsule Take 1 capsule (300 mg total) by mouth 3 (three) times daily. Patient taking differently: Take 300 mg by mouth 2 (two) times daily. 10/09/22   Gold, Glenice Laine, PA-C  hydrocortisone (ANUSOL-HC) 25 MG suppository Place 1 suppository (25 mg total) rectally 2 (two) times daily. 01/17/23   Margaretann Loveless, PA-C  ipratropium-albuterol (DUONEB)  0.5-2.5 (3) MG/3ML SOLN Take 3 mLs by nebulization 4 (four) times daily. Patient taking differently: Take 3 mLs by nebulization every 6 (six) hours as needed (shortness of breath). 09/01/22   Minor, Vilinda Blanks, NP  mometasone-formoterol (DULERA) 200-5 MCG/ACT AERO Inhale 2 puffs into the lungs 2 (two) times daily. 09/01/22   Minor, Vilinda Blanks, NP  nicotine (NICODERM CQ - DOSED IN MG/24 HOURS) 14 mg/24hr patch Place onto the skin. 09/06/22   [provider]    Family History Family History  Problem Relation Age of Onset   Hypertension Other    Diabetes Other    Crohn's disease Mother    Hypertension Mother  Thyroid disease Father    Diabetes Father    Breast cancer Maternal Aunt 30   Breast cancer Paternal Grandmother        dx under 76   Breast cancer Other 60       MGMs sister, currently has stage IV    Social History Social History   Tobacco Use   Smoking status: Former    Packs/day: 0.50    Years: 10.00    Additional pack years: 0.00    Total pack years: 5.00    Types: Cigarettes   Smokeless tobacco: Never   Tobacco comments:    Quit smoking on 10/05/22 -- 11/10/2022 [facility]  Vaping Use   Vaping Use: Never used  Substance Use Topics   Alcohol use: Yes    Comment: rarely   Drug use: No     Allergies   Percocet [oxycodone-acetaminophen] and Pineapple   Review of Systems Review of Systems  Respiratory:  Positive for cough.      Physical Exam Triage Vital Signs ED Triage Vitals  Enc Vitals Group     BP 03/07/23 0833 137/82     Pulse Rate 03/07/23 0832 77     Resp 03/07/23 0832 17     Temp 03/07/23 0832 98.5 F (36.9 C)     Temp Source 03/07/23 0832 Oral     SpO2 03/07/23 0832 95 %     Weight --      Height --      Head Circumference --      Peak Flow --      Pain Score 03/07/23 0831 0     Pain Loc --      Pain Edu? --      Excl. in GC? --    No data found.  Updated Vital Signs BP 137/82   Pulse 77   Temp 98.5 F (36.9 C) (Oral)   Resp 17    LMP 02/16/2023 (Exact Date)   SpO2 95%   Visual Acuity Right Eye Distance:   Left Eye Distance:   Bilateral Distance:    Right Eye Near:   Left Eye Near:    Bilateral Near:     Physical Exam Vitals and nursing note reviewed.  Constitutional:      General: She is not in acute distress.    Appearance: She is well-developed. She is not ill-appearing.  HENT:     Head: Normocephalic and atraumatic.     Right Ear: Tympanic membrane and ear canal normal.     Left Ear: Tympanic membrane and ear canal normal.     Nose: No congestion.     Mouth/Throat:     Mouth: Mucous membranes are moist.     Pharynx: Oropharynx is clear. Uvula midline. No posterior oropharyngeal erythema.     Tonsils: No tonsillar exudate or tonsillar abscesses.  Eyes:     Conjunctiva/sclera: Conjunctivae normal.     Pupils: Pupils are equal, round, and reactive to light.  Cardiovascular:     Rate and Rhythm: Normal rate and regular rhythm.     Heart sounds: Normal heart sounds.  Pulmonary:     Effort: Pulmonary effort is normal.     Breath sounds: Normal breath sounds.  Musculoskeletal:     Cervical back: Normal range of motion and neck supple.  Lymphadenopathy:     Cervical: No cervical adenopathy.  Skin:    General: Skin is warm and dry.          Comments: Dry  slightly scaling rash to the right breast.  No swelling or drainage.  Nontender to palpation.  No vesicles or lesions.  Neurological:     General: No focal deficit present.     Mental Status: She is alert and oriented to person, place, and time.  Psychiatric:        Mood and Affect: Mood normal.        Behavior: Behavior normal.      UC Treatments / Results  Labs (all labs ordered are listed, but only abnormal results are displayed) Labs Reviewed - No data to display  EKG   Radiology DG Chest 2 View  Result Date: 03/07/2023 CLINICAL DATA:  cough x 1 month, hx od empyema in Feb EXAM: CHEST - 2 VIEW COMPARISON:  Multiple priors  FINDINGS: Cardiomediastinal silhouette was remains within normal limits. No right effusion. Similar elevation/blunting of the left costophrenic angle. Streaky linear densities in the left mid to lower lung appears slightly improved. Surgical staple line is present. The right lung is clear. No acute osseous abnormality. IMPRESSION: Postsurgical changes in the left hemithorax with similar elevation/blunting of the left costophrenic angle likely representing a combination of the volume loss and possible small effusion. Atelectatic densities in the left mid to lower lung appears slightly improved on today's exam. Electronically Signed   By: Olive Bass M.D.   On: 03/07/2023 10:00    Procedures Procedures (including critical care time)  Medications Ordered in UC Medications - No data to display  Initial Impression / Assessment and Plan / UC Course  I have reviewed the triage vital signs and the nursing notes.  Pertinent labs & imaging results that were available during my care of the patient were reviewed by me and considered in my medical decision making (see chart for details).     Reviewed exam and symptoms with patient.  Patient is well-appearing and in no acute distress.  Chest x-ray appears stable from previous checks esterase, no signs of acute pneumonia or new masses.  Reviewed this with patient.  Given length of cough discussed antibiotics but she would like to follow-up with her pulmonologist first.  Advised to contact them today to make an appointment to be seen ASAP.  Also advised following up with PCP in a few days for recheck.  Strict ER precautions reviewed and patient verbalized understanding Final Clinical Impressions(s) / UC Diagnoses   Final diagnoses:  Acute cough     Discharge Instructions      Your x-ray seems unchanged from your previous x-rays.  No new signs of infection or masses.  Please follow-up with your pulmonologist as soon as possible for further evaluation of  your cough.  I would also recommend you follow-up with your PCP in a few days for recheck as well.  Please go to the emergency room if you develop any worsening symptoms.  I do hope you feel better soon!     ED Prescriptions   None    PDMP not reviewed this encounter.   Radford Pax, NP 03/07/23 1008

## 2023-03-07 NOTE — Discharge Instructions (Addendum)
Your x-ray seems unchanged from your previous x-rays.  No new signs of infection or masses.  Please follow-up with your pulmonologist as soon as possible for further evaluation of your cough.  I would also recommend you follow-up with your PCP in a few days for recheck as well.  Please go to the emergency room if you develop any worsening symptoms.  I do hope you feel better soon!

## 2023-03-16 ENCOUNTER — Telehealth: Payer: Medicaid Other | Admitting: Nurse Practitioner

## 2023-03-16 ENCOUNTER — Telehealth: Payer: Medicaid Other | Admitting: Physician Assistant

## 2023-03-16 DIAGNOSIS — B9689 Other specified bacterial agents as the cause of diseases classified elsewhere: Secondary | ICD-10-CM | POA: Diagnosis not present

## 2023-03-16 DIAGNOSIS — N760 Acute vaginitis: Secondary | ICD-10-CM

## 2023-03-16 DIAGNOSIS — R3989 Other symptoms and signs involving the genitourinary system: Secondary | ICD-10-CM

## 2023-03-16 MED ORDER — METRONIDAZOLE 500 MG PO TABS: 500.0000 mg | ORAL_TABLET | Freq: Two times a day (BID) | ORAL | 0 refills | Status: AC

## 2023-03-16 NOTE — Progress Notes (Signed)
E-Visit for Vaginal Symptoms  We are sorry that you are not feeling well. Here is how we plan to help! Based on what you shared with me it looks like you: May have a vaginosis due to bacteria  Vaginosis is an inflammation of the vagina that can result in discharge, itching and pain. The cause is usually a change in the normal balance of vaginal bacteria or an infection. Vaginosis can also result from reduced estrogen levels after menopause.  The most common causes of vaginosis are:   Bacterial vaginosis which results from an overgrowth of one on several organisms that are normally present in your vagina.   Yeast infections which are caused by a naturally occurring fungus called candida.   Vaginal atrophy (atrophic vaginosis) which results from the thinning of the vagina from reduced estrogen levels after menopause.   Trichomoniasis which is caused by a parasite and is commonly transmitted by sexual intercourse.  Factors that increase your risk of developing vaginosis include: Medications, such as antibiotics and steroids Uncontrolled diabetes Use of hygiene products such as bubble bath, vaginal spray or vaginal deodorant Douching Wearing damp or tight-fitting clothing Using an intrauterine device (IUD) for birth control Hormonal changes, such as those associated with pregnancy, birth control pills or menopause Sexual activity Having a sexually transmitted infection  Your treatment plan is Metronidazole or Flagyl 500mg twice a day for 7 days.  I have electronically sent this prescription into the pharmacy that you have chosen.  Be sure to take all of the medication as directed. Stop taking any medication if you develop a rash, tongue swelling or shortness of breath. Mothers who are breast feeding should consider pumping and discarding their breast milk while on these antibiotics. However, there is no consensus that infant exposure at these doses would be harmful.  Remember that  medication creams can weaken latex condoms. .   HOME CARE:  Good hygiene may prevent some types of vaginosis from recurring and may relieve some symptoms:  Avoid baths, hot tubs and whirlpool spas. Rinse soap from your outer genital area after a shower, and dry the area well to prevent irritation. Don't use scented or harsh soaps, such as those with deodorant or antibacterial action. Avoid irritants. These include scented tampons and pads. Wipe from front to back after using the toilet. Doing so avoids spreading fecal bacteria to your vagina.  Other things that may help prevent vaginosis include:  Don't douche. Your vagina doesn't require cleansing other than normal bathing. Repetitive douching disrupts the normal organisms that reside in the vagina and can actually increase your risk of vaginal infection. Douching won't clear up a vaginal infection. Use a latex condom. Both female and female latex condoms may help you avoid infections spread by sexual contact. Wear cotton underwear. Also wear pantyhose with a cotton crotch. If you feel comfortable without it, skip wearing underwear to bed. Yeast thrives in moist environments Your symptoms should improve in the next day or two.  GET HELP RIGHT AWAY IF:  You have pain in your lower abdomen ( pelvic area or over your ovaries) You develop nausea or vomiting You develop a fever Your discharge changes or worsens You have persistent pain with intercourse You develop shortness of breath, a rapid pulse, or you faint.  These symptoms could be signs of problems or infections that need to be evaluated by a medical provider now.  MAKE SURE YOU   Understand these instructions. Will watch your condition. Will get help right   away if you are not doing well or get worse.  Thank you for choosing an e-visit.  Your e-visit answers were reviewed by a board certified advanced clinical practitioner to complete your personal care plan. Depending upon the  condition, your plan could have included both over the counter or prescription medications.  Please review your pharmacy choice. Make sure the pharmacy is open so you can pick up prescription now. If there is a problem, you may contact your provider through MyChart messaging and have the prescription routed to another pharmacy.  Your safety is important to us. If you have drug allergies check your prescription carefully.   For the next 24 hours you can use MyChart to ask questions about today's visit, request a non-urgent call back, or ask for a work or school excuse. You will get an email in the next two days asking about your experience. I hope that your e-visit has been valuable and will speed your recovery.  I have spent 5 minutes in review of e-visit questionnaire, review and updating patient chart, medical decision making and response to patient.   Jennifer M Burnette, PA-C  

## 2023-03-17 MED ORDER — NITROFURANTOIN MONOHYD MACRO 100 MG PO CAPS: 100.0000 mg | ORAL_CAPSULE | Freq: Two times a day (BID) | ORAL | 0 refills | Status: AC

## 2023-03-17 NOTE — Progress Notes (Signed)
E-Visit for Urinary Problems  We are sorry that you are not feeling well.  Here is how we plan to help!  Given your combination of symptoms, if you do not have improvement in the next 24 hours, or with any new or worsening symptoms we will recommend in person evaluation and testing.   Based on what you shared with me it looks like you most likely have a simple urinary tract infection.  A UTI (Urinary Tract Infection) is a bacterial infection of the bladder.  Most cases of urinary tract infections are simple to treat but a key part of your care is to encourage you to drink plenty of fluids and watch your symptoms carefully.  I have prescribed MacroBid 100 mg twice a day for 5 days.  Your symptoms should gradually improve. Call us if the burning in your urine worsens, you develop worsening fever, back pain or pelvic pain or if your symptoms do not resolve after completing the antibiotic.  Urinary tract infections can be prevented by drinking plenty of water to keep your body hydrated.  Also be sure when you wipe, wipe from front to back and don't hold it in!  If possible, empty your bladder every 4 hours.  HOME CARE Drink plenty of fluids Compete the full course of the antibiotics even if the symptoms resolve Remember, when you need to go.go. Holding in your urine can increase the likelihood of getting a UTI! GET HELP RIGHT AWAY IF: You cannot urinate You get a high fever Worsening back pain occurs You see blood in your urine You feel sick to your stomach or throw up You feel like you are going to pass out  MAKE SURE YOU  Understand these instructions. Will watch your condition. Will get help right away if you are not doing well or get worse.   Thank you for choosing an e-visit.  Your e-visit answers were reviewed by a board certified advanced clinical practitioner to complete your personal care plan. Depending upon the condition, your plan could have included both over the counter  or prescription medications.  Please review your pharmacy choice. Make sure the pharmacy is open so you can pick up prescription now. If there is a problem, you may contact your provider through [employer] and have the prescription routed to another pharmacy.  Your safety is important to [place]. If you have drug allergies check your prescription carefully.   For the next 24 hours you can use MyChart to ask questions about today's visit, request a non-urgent call back, or ask for a work or school excuse. You will get an email in the next two days asking about your experience. I hope that your e-visit has been valuable and will speed your recovery.   Meds ordered this encounter  Medications   nitrofurantoin, macrocrystal-monohydrate, (MACROBID) 100 MG capsule    Sig: Take 1 capsule (100 mg total) by mouth 2 (two) times daily for 5 days.    Dispense:  10 capsule    Refill:  0    I spent approximately 5 minutes reviewing the patient's history, current symptoms and coordinating their care today.

## 2023-04-04 NOTE — Progress Notes (Deleted)
04/04/2023 RAYLI OQUENDO [id number] [birth year]  Referring provider: Harvel Quale, MD Primary GI doctor: {acdocs:27040}  ASSESSMENT AND PLAN:   There are no diagnoses linked to this encounter.   Patient Care Team: Songer, Delice Bison, MD as PCP - General (Student)  HISTORY OF PRESENT ILLNESS: 32 y.o. female with a past medical history of asthma, morbid obesity, tobacco use, elevated blood pressure, proximal atrial fibrillation, migraine headaches intermittently on Goody powders, cholecystectomy 2016, IBS/bile induced diarrhea, chronic recurrent anemia since 2009, hemorrhoids and others listed below presents for evaluation of rectal bleeding.   Patient had previous history at [facility] with [facility] for rectal bleeding and diarrhea. EGD/colonoscopy 06/2016 unremarkable, normal TI, normal random colon biopsies other than internal hemorrhoids. 2018 repeat colon for continuing rectal bleeding with anal fissure,   which she required PICC line for IV access - again unremarkable other than internal hemorrhoids. Recall colonoscopy at age 47 Patient did do a trial of Xifaxan for diarrhea 2017. 2018 CT A/P fluid in pelvis from ruptured left ovarian cyst otherwise unremarkable, normal liver, status post cholecystectomy normal pancreas normal bile ducts normal GI tract 09/10/2020 patient started on colestipol for bile acid diarrhea given Proctofoam for hemorrhoids   Patient {Actions; denies-reports:120008} family history of colon cancer or other gastrointestinal malignancies.   She {Actions; denies-reports:120008} blood thinner use.  She {Actions; denies-reports:120008} NSAID use.  She {Actions; denies-reports:120008} ETOH use.   She {Actions; denies-reports:120008} tobacco use.  She {Actions; denies-reports:120008} drug use.    She  reports that she has quit smoking. Her smoking use included cigarettes. She has a 5 pack-year smoking history. She has never used smokeless tobacco. She reports  current alcohol use. She reports that she does not use drugs.  RELEVANT LABS AND IMAGING: CBC    Component Value Date/Time   WBC 9.8 11/14/2022 0959   RBC 6.15 (H) 11/14/2022 0959   HGB 10.7 (L) 11/14/2022 0959   HGB CANCELED 01/12/2022 1506   HCT 37.7 11/14/2022 0959   HCT CANCELED 01/12/2022 1506   PLT 341 11/14/2022 0959   PLT CANCELED 01/12/2022 1506   MCV 61.3 (L) 11/14/2022 0959   MCH 17.4 (L) 11/14/2022 0959   MCHC 28.4 (L) 11/14/2022 0959   RDW 24.3 (H) 11/14/2022 0959   LYMPHSABS 2,773 11/14/2022 0959   LYMPHSABS CANCELED 01/12/2022 1506   MONOABS 0.5 10/25/2022 0600   EOSABS 265 11/14/2022 0959   EOSABS CANCELED 01/12/2022 1506   BASOSABS 29 11/14/2022 0959   BASOSABS CANCELED 01/12/2022 1506   Recent Labs    10/19/22 0149 10/20/22 0716 10/21/22 0629 10/22/22 0436 10/23/22 0435 10/24/22 0517 10/25/22 0600 10/26/22 0519 10/27/22 0419 11/14/22 0959  HGB 7.3* 7.2* 7.1* 7.6* 7.5* 7.8* 7.9* 8.3* 7.9* 10.7*    CMP     Component Value Date/Time   NA 141 11/14/2022 0959   NA 142 01/12/2022 1506   K 4.0 11/14/2022 0959   CL 107 11/14/2022 0959   CO2 24 11/14/2022 0959   GLUCOSE 89 11/14/2022 0959   BUN 7 11/14/2022 0959   BUN 10 01/12/2022 1506   CREATININE 0.60 11/14/2022 0959   CALCIUM 9.5 11/14/2022 0959   PROT 7.8 11/14/2022 0959   PROT 7.2 01/12/2022 1506   ALBUMIN 2.1 (L) 10/24/2022 0517   ALBUMIN 4.0 01/12/2022 1506   AST 10 11/14/2022 0959   ALT 15 11/14/2022 0959   ALKPHOS 72 10/24/2022 0517   BILITOT 0.3 11/14/2022 0959   BILITOT 0.2 01/12/2022 1506   GFRNONAA >60  10/26/2022 0519   GFRAA >60 02/04/2020 1820      Latest Ref Rng & Units 11/14/2022    9:59 AM 10/24/2022    5:17 AM 10/23/2022    4:35 AM  Hepatic Function  Total Protein 6.1 - 8.1 g/dL 7.8  5.9  6.0   Albumin 3.5 - 5.0 g/dL  2.1  2.1   AST 10 - 30 U/L 10  18  15    ALT 6 - 29 U/L 15  22  25    Alk Phosphatase 38 - 126 U/L  72  75   Total Bilirubin 0.2 - 1.2 mg/dL 0.3  0.5   0.6       Current Medications:     Current Outpatient Medications (Respiratory):    albuterol (VENTOLIN HFA) 108 (90 Base) MCG/ACT inhaler, Inhale 2 puffs into the lungs every 6 (six) hours as needed for wheezing or shortness of breath (Cough).   benzonatate (TESSALON) 200 MG capsule, Take 1 capsule (200 mg total) by mouth 3 (three) times daily.   ipratropium-albuterol (DUONEB) 0.5-2.5 (3) MG/3ML SOLN, Take 3 mLs by nebulization 4 (four) times daily. (Patient taking differently: Take 3 mLs by nebulization every 6 (six) hours as needed (shortness of breath).)   mometasone-formoterol (DULERA) 200-5 MCG/ACT AERO, Inhale 2 puffs into the lungs 2 (two) times daily.  Current Outpatient Medications (Analgesics):    acetaminophen (TYLENOL) 500 MG tablet, Take 1,000 mg by mouth 2 (two) times daily as needed for mild pain, moderate pain, headache or fever.   Current Outpatient Medications (Other):    fluconazole (DIFLUCAN) 150 MG tablet, Take 1 tablet (150 mg total) by mouth every 3 (three) days as needed. (Patient not taking: Reported on 11/15/2022)   gabapentin (NEURONTIN) 300 MG capsule, Take 1 capsule (300 mg total) by mouth 3 (three) times daily. (Patient taking differently: Take 300 mg by mouth 2 (two) times daily.)   hydrocortisone (ANUSOL-HC) 25 MG suppository, Place 1 suppository (25 mg total) rectally 2 (two) times daily.   nicotine (NICODERM CQ - DOSED IN MG/24 HOURS) 14 mg/24hr patch, Place onto the skin.  Medical History:  Past Medical History:  Diagnosis Date   Atrial fibrillation (HCC) 2020   Complication of anesthesia    wakes up emotional   Family history of adverse reaction to anesthesia    mother has n & v   Family history of breast cancer    Headache 07/25/2017   Hypertension    Irritable bowel syndrome    Morbidly obese (HCC) 07/25/2017   PONV (postoperative nausea and vomiting)    QT prolongation    T wave inversion in EKG    Allergies:  Allergies  Allergen  Reactions   Percocet [Oxycodone-Acetaminophen] Hives, Itching and Nausea Only   Pineapple Itching    Fresh pineapples; pineapples from a can is okay     Surgical History:  She  has a past surgical history that includes Cesarean section; Hernia repair; Tonsillectomy; Cholecystectomy; Cesarean section (N/A, 01/31/2020); Bronchial biopsy (08/30/2022); Bronchial needle aspiration biopsy (08/30/2022); Bronchial brushings (08/30/2022); Video bronchoscopy with endobronchial ultrasound (N/A, 10/06/2022); Intercostal nerve block (Left, 10/06/2022); IR THORACENTESIS ASP PLEURAL SPACE W/IMG GUIDE (10/17/2022); Video assisted thoracoscopy (Left, 10/21/2022); Pleural effusion drainage (Left, 10/21/2022); and Decortication (Left, 10/21/2022). Family History:  Her family history includes Breast cancer in her paternal grandmother; Breast cancer (age of onset: 25) in her maternal aunt; Breast cancer (age of onset: 36) in an other family member; Crohn's disease in her mother; Diabetes in her father  and another family member; Hypertension in her mother and another family member; Thyroid disease in her father.  REVIEW OF SYSTEMS  : All other systems reviewed and negative except where noted in the History of Present Illness.  PHYSICAL EXAM: LMP 02/16/2023 (Exact Date)  General Appearance: Well nourished, in no apparent distress. Head:   Normocephalic and atraumatic. Eyes:  sclerae anicteric,conjunctive pink  Respiratory: Respiratory effort normal, BS equal bilaterally without rales, rhonchi, wheezing. Cardio: RRR with no MRGs. Peripheral pulses intact.  Abdomen: Soft,  {BlankSingle:19197::"Flat","Obese","Non-distended"} ,active bowel sounds. {actendernessAB:27319} tenderness {anatomy; site abdomen:5010}. {BlankMultiple:19196::"Without guarding","With guarding","Without rebound","With rebound"}. No masses. Rectal: {acrectalexam:27461} Musculoskeletal: Full ROM, {PSY - GAIT AND STATION:22860} gait. {With/Without:304960234}  edema. Skin:  Dry and intact without significant lesions or rashes Neuro: Alert and  oriented x4;  No focal deficits. Psych:  Cooperative. Normal mood and affect.    Doree Albee, PA-C 9:29 AM

## 2023-04-05 ENCOUNTER — Ambulatory Visit: Payer: Medicaid Other | Admitting: Physician Assistant

## 2023-05-20 ENCOUNTER — Telehealth: Payer: Medicaid Other | Admitting: Nurse Practitioner

## 2023-05-20 DIAGNOSIS — H109 Unspecified conjunctivitis: Secondary | ICD-10-CM | POA: Diagnosis not present

## 2023-05-20 MED ORDER — POLYMYXIN B-TRIMETHOPRIM 10000-0.1 UNIT/ML-% OP SOLN: 1.0000 [drp] | OPHTHALMIC | 0 refills | Status: DC

## 2023-05-20 NOTE — Progress Notes (Signed)
E-Visit for Pink Eye ? ? ?We are sorry that you are not feeling well.  Here is how we plan to help! ? ?Based on what you have shared with me it looks like you have conjunctivitis.  Conjunctivitis is a common inflammatory or infectious condition of the eye that is often referred to as "pink eye".  In most cases it is contagious (viral or bacterial). However, not all conjunctivitis requires antibiotics (ex. Allergic).  We have made appropriate suggestions for you based upon your presentation. ? ?I have prescribed Polytrim Ophthalmic drops 1-2 drops 4 times a day times 5 days ? ?Pink eye can be highly contagious.  It is typically spread through direct contact with secretions, or contaminated objects or surfaces that one may have touched.  Strict handwashing is suggested with soap and water is urged.  If not available, use alcohol based had sanitizer.  Avoid unnecessary touching of the eye.  If you wear contact lenses, you will need to refrain from wearing them until you see no white discharge from the eye for at least 24 hours after being on medication.  You should see symptom improvement in 1-2 days after starting the medication regimen.  Call us if symptoms are not improved in 1-2 days. ? ?Home Care: ?Wash your hands often! ?Do not wear your contacts until you complete your treatment plan. ?Avoid sharing towels, bed linen, personal items with a person who has pink eye. ?See attention for anyone in your home with similar symptoms. ? ?Get Help Right Away If: ?Your symptoms do not improve. ?You develop blurred or loss of vision. ?Your symptoms worsen (increased discharge, pain or redness) ? ? ?Thank you for choosing an e-visit. ? ?Your e-visit answers were reviewed by a board certified advanced clinical practitioner to complete your personal care plan. Depending upon the condition, your plan could have included both over the counter or prescription medications. ? ?Please review your pharmacy choice. Make sure the  pharmacy is open so you can pick up prescription now. If there is a problem, you may contact your provider through [employer] and have the prescription routed to another pharmacy.  Your safety is important to [place]. If you have drug allergies check your prescription carefully.  ? ?For the next 24 hours you can use MyChart to ask questions about today's visit, request a non-urgent call back, or ask for a work or school excuse. ?You will get an email in the next two days asking about your experience. I hope that your e-visit has been valuable and will speed your recovery. ? ?

## 2023-05-20 NOTE — Progress Notes (Signed)
I have spent 5 minutes in review of e-visit questionnaire, review and updating patient chart, medical decision making and response to patient.  ° °Jerrell Mangel W Secilia Apps, NP ° °  °

## 2023-05-24 ENCOUNTER — Telehealth: Payer: Medicaid Other | Admitting: Physician Assistant

## 2023-05-24 DIAGNOSIS — L989 Disorder of the skin and subcutaneous tissue, unspecified: Secondary | ICD-10-CM

## 2023-05-24 NOTE — Progress Notes (Signed)
Hi Sheniqua,  Because it is hard to distinguish exactly what this is without a physical exam, I feel your condition warrants further evaluation and I recommend that you be seen for a face to face visit. This is most likely a plantar wart which would need to be frozen (can be done in office) or a similar lesion, but we need to make sure in order to treat you properly. Please contact your primary care physician practice to be seen. Many offices offer virtual options to be seen via video if you are not comfortable going in person to a medical facility at this time.  NOTE: You will NOT be charged for this eVisit.  If you do not have a PCP, [facility] offers a free physician referral service available at [phone]. Our trained staff has the experience, knowledge and resources to put you in touch with a physician who is right for you.    If you are having a true medical emergency please call 911.   Your e-visit answers were reviewed by a board certified advanced clinical practitioner to complete your personal care plan.  Thank you for using e-Visits.

## 2023-06-29 ENCOUNTER — Telehealth: Payer: Medicaid Other | Admitting: Physician Assistant

## 2023-06-29 DIAGNOSIS — K921 Melena: Secondary | ICD-10-CM

## 2023-06-29 NOTE — Progress Notes (Signed)
Because of black stool which could indicate blood and need for examination, I feel your condition warrants further evaluation and I recommend that you be seen in a face to face visit.   NOTE: There will be NO CHARGE for this eVisit   If you are having a true medical emergency please call 911.      For an urgent face to face visit, [facility] has eight urgent care centers for your convenience:   NEW!! [facility] at [facility] Get Driving Directions [phone] [address], [address] [place], [address]    [facility] at [facility] Get Driving Directions [phone] [address] [place], [place] [address]   [facility]) Get Driving Directions [phone] [address] [place], [place] [address]  [facility] Center [facility]) Get Driving Directions [phone] [address] [place],  [place]  [address]  [facility] Health Urgent Care Center [facility] - at [employer]  [phone] [place],  [place] [address]   [facility] Urgent Care at [facility] Get Driving Directions [phone] [address] [place], [place] [address]   [facility] Health Urgent Care at [facility] Get Driving Directions  [phone] [address] [place], [place] [address]   [facility] Urgent Care at [facility] An Directions [phone] [address], [address] [place], [place] [address]  Your MyChart E-visit questionnaire answers were reviewed by a board certified advanced clinical practitioner to complete your personal care plan based on your specific symptoms.  Thank you for using e-Visits.

## 2023-07-12 ENCOUNTER — Telehealth: Payer: Self-pay

## 2023-07-12 ENCOUNTER — Other Ambulatory Visit: Payer: Self-pay

## 2023-07-12 DIAGNOSIS — R234 Changes in skin texture: Secondary | ICD-10-CM

## 2023-07-12 NOTE — Telephone Encounter (Signed)
Patient called and stated that she needs a pap smear and also is has a red rash on her right breast. Patient stated that she had an abnormal pap smear at [facility] on 03/09/2021 (LSIL), then on 03/24/2021-benign colposcopy, and has never did a follow-up pap smear. Patient states she is now having abnormal bleeding between menstrual cycles, with sexual intercourse, and menstrual cycles have become so heavy until blood is running down her legs, also has lower back pain. She does have a history of fibroids, but has never followed up and insurance(medicaid) ended 06/29/2023. Patient also has a dark red, scaly rash on right breast, that is scaly, itchy, and is raised with redness at times x 9 months that resembles eczema, but is not clearing. Patient scheduled to be seen in [facility] on 07/24/2023.

## 2023-07-17 ENCOUNTER — Other Ambulatory Visit: Payer: Self-pay

## 2023-07-17 ENCOUNTER — Ambulatory Visit: Payer: Self-pay | Attending: Hematology and Oncology | Admitting: Hematology and Oncology

## 2023-07-17 VITALS — BP 159/81 | Wt 327.2 lb

## 2023-07-17 DIAGNOSIS — R234 Changes in skin texture: Secondary | ICD-10-CM

## 2023-07-17 DIAGNOSIS — Z01419 Encounter for gynecological examination (general) (routine) without abnormal findings: Secondary | ICD-10-CM

## 2023-07-17 NOTE — Progress Notes (Signed)
[name] is a 32 y.o. G54P1102 female who presents to [facility] clinic today with complaint of scaly patch of skin noted on right breast.    Pap Smear: Pap smear completed today. Last Pap smear was 03/09/2021 at [facility] clinic and was abnormal - LSIL . Per patient has no history of an abnormal Pap smear. Last Pap smear result is available in Epic. 03/24/2021 - Focal koilocytic atypia (consistent with HPV effect)/ Rare benign squamous and endocervical cells.    Physical exam: Breasts Breasts symmetrical. No skin abnormalities left breast. Right breast with a 4 cm area of darkened and scaly skin noted. No nipple retraction bilateral breasts. No nipple discharge bilateral breasts. No lymphadenopathy. No lumps palpated bilateral breasts.       Pelvic/Bimanual Ext Genitalia No lesions, no swelling and no discharge observed on external genitalia.        Vagina Vagina pink and normal texture. No lesions or discharge observed in vagina.        Cervix Cervix is present. Cervix pink and of normal texture. No discharge observed.    Uterus Uterus is present and palpable. Uterus in normal position and normal size.        Adnexae Bilateral ovaries present and palpable. No tenderness on palpation.         Rectovaginal No rectal exam completed today since patient had no rectal complaints. No skin abnormalities observed on exam.     Smoking History: Patient is a current smoker and was referred to quit line.    Patient Navigation: Patient education provided. Access to services provided for patient through [facility] program. No interpreter provided. No transportation provided   Colorectal Cancer Screening: Per patient has never had colonoscopy completed No complaints today.    Breast and Cervical Cancer Risk Assessment: Patient does not have family history of breast cancer, known genetic mutations, or radiation treatment to the chest before age 4. Patient has history of cervical  dysplasia, immunocompromised, or DES exposure in-utero.  Risk Assessment   No risk assessment data       A: BCCCP exam with pap smear Complaint of right breast skin changes. Has had an area to her right breast that comes and goes. She describes this area as dry and itchy. She states this has been present since at least February or March 2024 as she remembers it being treated with an ointment during a hospitalization for sepsis. She states the area has never completely gone away and gets bigger and smaller. She has not had antibiotics recently and the area does not appear infected today. It does appear dry and scaly, approximately 4 cm.   P: Referred patient to the [facility] for a diagnostic mammogram. Appointment scheduled 07/24/2023.  Julie Lux, NP 07/17/2023 2:54 PM

## 2023-07-17 NOTE — Patient Instructions (Addendum)
Taught Julie King about self breast awareness and gave educational materials to take home. Patient did need a Pap smear today due to last Pap smear was in 03/09/2021 per patient.  Let her know [facility] will cover Pap smears every 5 years unless has a history of abnormal Pap smears. Referred patient to the [facility] for diagnostic mammogram. Appointment scheduled for 07/24/2023. Patient aware of appointment and will be there. Let patient know will follow up with her within the next couple weeks with results. Julie King verbalized understanding.  Pascal Lux, NP 2:46 PM

## 2023-07-20 LAB — CYTOLOGY - PAP
Adequacy: ABSENT
Comment: NEGATIVE
High risk HPV: NEGATIVE

## 2023-07-21 ENCOUNTER — Telehealth: Payer: Self-pay

## 2023-07-21 NOTE — Telephone Encounter (Signed)
Left detailed message on identifying voicemail with results and does need to be scheduled for colposcopy, Christy(Scheduler) will call to schedule.

## 2023-07-24 ENCOUNTER — Ambulatory Visit: Payer: Medicaid Other

## 2023-07-24 ENCOUNTER — Ambulatory Visit
Admission: RE | Admit: 2023-07-24 | Discharge: 2023-07-24 | Disposition: A | Discharge status: Home / Self Care | Payer: Self-pay | Source: Ambulatory Visit | Attending: Obstetrics and Gynecology | Admitting: Obstetrics and Gynecology

## 2023-07-24 DIAGNOSIS — R234 Changes in skin texture: Secondary | ICD-10-CM

## 2023-07-26 ENCOUNTER — Telehealth: Payer: Self-pay | Admitting: Physician Assistant

## 2023-07-26 ENCOUNTER — Emergency Department
Admission: EM | Admit: 2023-07-26 | Discharge: 2023-07-27 | Disposition: A | Discharge status: Home / Self Care | Payer: Self-pay | Attending: Emergency Medicine | Admitting: Emergency Medicine

## 2023-07-26 ENCOUNTER — Other Ambulatory Visit: Payer: Self-pay

## 2023-07-26 DIAGNOSIS — K922 Gastrointestinal hemorrhage, unspecified: Secondary | ICD-10-CM | POA: Insufficient documentation

## 2023-07-26 DIAGNOSIS — K625 Hemorrhage of anus and rectum: Secondary | ICD-10-CM

## 2023-07-26 DIAGNOSIS — I10 Essential (primary) hypertension: Secondary | ICD-10-CM | POA: Insufficient documentation

## 2023-07-26 DIAGNOSIS — F17200 Nicotine dependence, unspecified, uncomplicated: Secondary | ICD-10-CM | POA: Insufficient documentation

## 2023-07-26 LAB — COMPREHENSIVE METABOLIC PANEL
ALT: 16 U/L (ref 0–44)
AST: 15 U/L (ref 15–41)
Albumin: 3.8 g/dL (ref 3.5–5.0)
Alkaline Phosphatase: 72 U/L (ref 38–126)
Anion gap: 8 (ref 5–15)
BUN: 11 mg/dL (ref 6–20)
CO2: 24 mmol/L (ref 22–32)
Calcium: 8.8 mg/dL — ABNORMAL LOW (ref 8.9–10.3)
Chloride: 105 mmol/L (ref 98–111)
Creatinine, Ser: 0.61 mg/dL (ref 0.44–1.00)
GFR, Estimated: >60 mL/min (ref >60)
Glucose, Bld: 86 mg/dL (ref 70–99)
Potassium: 4.2 mmol/L (ref 3.5–5.1)
Sodium: 137 mmol/L (ref 135–145)
Total Bilirubin: 0.4 mg/dL (ref <1.2)
Total Protein: 7.7 g/dL (ref 6.5–8.1)

## 2023-07-26 LAB — CBC
HCT: 33.6 % — ABNORMAL LOW (ref 36.0–46.0)
Hemoglobin: 10.1 g/dL — ABNORMAL LOW (ref 12.0–15.0)
MCH: 17.5 pg — ABNORMAL LOW (ref 26.0–34.0)
MCHC: 30.1 g/dL (ref 30.0–36.0)
MCV: 58.3 fL — ABNORMAL LOW (ref 80.0–100.0)
Platelets: 277 10*3/uL (ref 150–400)
RBC: 5.76 MIL/uL — ABNORMAL HIGH (ref 3.87–5.11)
RDW: 19.9 % — ABNORMAL HIGH (ref 11.5–15.5)
WBC: 12.8 10*3/uL — ABNORMAL HIGH (ref 4.0–10.5)
nRBC: 0 % (ref 0.0–0.2)

## 2023-07-26 LAB — POC URINE PREG, ED: Preg Test, Ur: NEGATIVE

## 2023-07-26 NOTE — Progress Notes (Signed)
Because of rectal bleeding and especially giving its duration and intensity, I feel your condition warrants further evaluation and I recommend that you be seen in a face to face visit. If you are having any lightheadedness, dizziness or severe rectal pain with this, you need an ER evaluation. Otherwise, you need to be seen in person with your PCP or at local Urgent Care (see below) for a detailed examination and further evaluation so we can make sure you get proper diagnosis and treatment.    NOTE: There will be NO CHARGE for this eVisit   If you are having a true medical emergency please call 911.      For an urgent face to face visit, [place] has eight urgent care centers for your convenience:   NEW!! [facility] Urgent Care Center at [facility] Get Driving Directions [phone] [address] [place], [address]    [facility] Health Urgent Care Center at [facility] Get Driving Directions [phone] [address] [place], [place] [address]   [facility] Health Urgent Care Center [facility]) Get Driving Directions [phone] [address] [place], [place] [address]  [facility] Health Urgent Care Center [facility]) Get Driving Directions [phone] [address] [place],  [place]  [address]  [facility] Health Urgent Care Center [facility] - at [employer]  [phone] [address] [place],  [place] [address]   [facility] Health Urgent Care at [facility] Get Driving Directions [phone] [address] [place], [place] [address]   [facility] Urgent Care at [facility] Get Driving Directions  [phone] [address] [place], [place] [address]   [facility] Health Urgent Care at [facility] Directions [phone] [address], [address] [place], [place] [address]  Your MyChart E-visit questionnaire answers were reviewed by a board  certified advanced clinical practitioner to complete your personal care plan based on your specific symptoms.  Thank you for using e-Visits.

## 2023-07-26 NOTE — ED Triage Notes (Signed)
Pt states has been having dark red rectal bleeding. Pt states is passing blood clots from rectum. Pt states has been going on for a week.

## 2023-07-26 NOTE — ED Provider Notes (Signed)
[facility] Provider Note    Event Date/Time   First MD Initiated Contact with Patient 07/26/23 2325     (approximate)   History   Rectal Bleeding   HPI  Julie King is a 32 y.o. female who presents to the ED for evaluation of Rectal Bleeding   Review of CT surgery clinic visit from March.  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  Underwent robotic assisted wedge resection in February, subsequent VATS.  No documented anticoagulation.  Patient presents with chronic rectal bleeding.  She reports at least 1 year of bleeding that seems to be heavier in the past 2 weeks.  No other bleeding diatheses, syncopal episodes, chest pain.  Reports a known history of hemorrhoids but has not seen a gastroenterologist in many years   Physical Exam   Triage Vital Signs: ED Triage Vitals  Encounter Vitals Group     BP 07/26/23 1905 (!) 145/95     Systolic BP Percentile --      Diastolic BP Percentile --      Pulse Rate 07/26/23 1905 83     Resp 07/26/23 1905 20     Temp 07/26/23 1905 98.4 F (36.9 C)     Temp Source 07/26/23 1905 Oral     SpO2 07/26/23 1905 100 %     Weight 07/26/23 1904 (!) 330 lb (149.7 kg)     Height 07/26/23 1904 5\' 7"  (1.702 m)     Head Circumference --      Peak Flow --      Pain Score 07/26/23 1904 3     Pain Loc --      Pain Education --      Exclude from Growth Chart --     Most recent vital signs: Vitals:   07/26/23 1905 07/26/23 2335  BP: (!) 145/95 (!) 141/88  Pulse: 83 96  Resp: 20 10  Temp: 98.4 F (36.9 C) 98.2 F (36.8 C)  SpO2: 100% 99%    General: Awake, no distress.  CV:  Good peripheral perfusion.  Resp:  Normal effort.  Abd:  No distention.  Soft and benign throughout MSK:  No deformity noted.  Neuro:  No focal deficits appreciated. Other:  Rectal exam offered but deferred by the patient   ED Results / Procedures /  Treatments   Labs (all labs ordered are listed, but only abnormal results are displayed) Labs Reviewed  COMPREHENSIVE METABOLIC PANEL - Abnormal; Notable for the following components:      Result Value   Calcium 8.8 (*)    All other components within normal limits  CBC - Abnormal; Notable for the following components:   WBC 12.8 (*)    RBC 5.76 (*)    Hemoglobin 10.1 (*)    HCT 33.6 (*)    MCV 58.3 (*)    MCH 17.5 (*)    RDW 19.9 (*)    All other components within normal limits  POC URINE PREG, ED  POC OCCULT BLOOD, ED  TYPE AND SCREEN    EKG   RADIOLOGY   Official radiology report(s): No results found.  PROCEDURES and INTERVENTIONS:  Procedures  Medications - No data to display   IMPRESSION / MDM / ASSESSMENT AND PLAN / ED COURSE  I reviewed the triage vital signs and the nursing notes.  Differential diagnosis includes, but is not limited to, hemorrhoids, internal or external, diverticulitis, hematuria  or vaginal bleeding, diverticular bleed  {Patient presents with symptoms of an acute illness or injury that is potentially life-threatening.  Patient presents with acute on chronic lower GI bleeding with a stable hemoglobin and suitable for outpatient management with GI follow-up.  Look systemically well with reassuring vital signs and stable hemoglobin from comparison 8 months ago.  Normal metabolic panel.  Benign abdominal exam.  Offered a rectal exam but she declines.  I considered observation admission for this patient but I believe she would be suitable for trial of outpatient management with GI follow-up.      FINAL CLINICAL IMPRESSION(S) / ED DIAGNOSES   Final diagnoses:  Rectal bleeding  Lower GI bleed     Rx / DC Orders   ED Discharge Orders     None        Note:  This document was prepared using Dragon voice recognition software and may include unintentional dictation errors.   Delton Prairie, MD 07/27/23 [phone]

## 2023-07-27 NOTE — ED Notes (Signed)
Pt upset regarding MD evaluation. Pt provided with management telephone number.

## 2023-07-29 ENCOUNTER — Emergency Department (HOSPITAL_BASED_OUTPATIENT_CLINIC_OR_DEPARTMENT_OTHER)
Admission: EM | Admit: 2023-07-29 | Discharge: 2023-07-29 | Disposition: A | Discharge status: Home / Self Care | Payer: Self-pay | Attending: Emergency Medicine | Admitting: Emergency Medicine

## 2023-07-29 DIAGNOSIS — D72829 Elevated white blood cell count, unspecified: Secondary | ICD-10-CM | POA: Insufficient documentation

## 2023-07-29 DIAGNOSIS — K644 Residual hemorrhoidal skin tags: Secondary | ICD-10-CM | POA: Insufficient documentation

## 2023-07-29 DIAGNOSIS — K625 Hemorrhage of anus and rectum: Secondary | ICD-10-CM

## 2023-07-29 LAB — LIPASE, BLOOD: Lipase: 35 U/L (ref 11–51)

## 2023-07-29 LAB — CBC
HCT: 35.8 % — ABNORMAL LOW (ref 36.0–46.0)
Hemoglobin: 10.6 g/dL — ABNORMAL LOW (ref 12.0–15.0)
MCH: 17.3 pg — ABNORMAL LOW (ref 26.0–34.0)
MCHC: 29.6 g/dL — ABNORMAL LOW (ref 30.0–36.0)
MCV: 58.6 fL — ABNORMAL LOW (ref 80.0–100.0)
Platelets: 287 10*3/uL (ref 150–400)
RBC: 6.11 MIL/uL — ABNORMAL HIGH (ref 3.87–5.11)
RDW: 20.6 % — ABNORMAL HIGH (ref 11.5–15.5)
WBC: 12.3 10*3/uL — ABNORMAL HIGH (ref 4.0–10.5)
nRBC: 0 % (ref 0.0–0.2)

## 2023-07-29 LAB — COMPREHENSIVE METABOLIC PANEL
ALT: 11 U/L (ref 0–44)
AST: 12 U/L — ABNORMAL LOW (ref 15–41)
Albumin: 4.1 g/dL (ref 3.5–5.0)
Alkaline Phosphatase: 69 U/L (ref 38–126)
Anion gap: 9 (ref 5–15)
BUN: 10 mg/dL (ref 6–20)
CO2: 24 mmol/L (ref 22–32)
Calcium: 9.1 mg/dL (ref 8.9–10.3)
Chloride: 105 mmol/L (ref 98–111)
Creatinine, Ser: 0.8 mg/dL (ref 0.44–1.00)
GFR, Estimated: >60 mL/min (ref >60)
Glucose, Bld: 80 mg/dL (ref 70–99)
Potassium: 4.1 mmol/L (ref 3.5–5.1)
Sodium: 138 mmol/L (ref 135–145)
Total Bilirubin: 0.4 mg/dL (ref <1.2)
Total Protein: 7.9 g/dL (ref 6.5–8.1)

## 2023-07-29 LAB — URINALYSIS, ROUTINE W REFLEX MICROSCOPIC
Bilirubin Urine: NEGATIVE
Glucose, UA: NEGATIVE mg/dL
Hgb urine dipstick: NEGATIVE
Leukocytes,Ua: NEGATIVE
Nitrite: NEGATIVE
Protein, ur: 30 mg/dL — AB
Specific Gravity, Urine: 1.04 — ABNORMAL HIGH (ref 1.005–1.030)
pH: 5.5 (ref 5.0–8.0)

## 2023-07-29 LAB — OCCULT BLOOD X 1 CARD TO LAB, STOOL: Fecal Occult Bld: NEGATIVE

## 2023-07-29 LAB — PREGNANCY, URINE: Preg Test, Ur: NEGATIVE

## 2023-07-29 NOTE — ED Notes (Signed)
Pt given 117 ml of shasta cola and a bag of popcorn.

## 2023-07-29 NOTE — ED Notes (Signed)
Pt still unable to void

## 2023-07-29 NOTE — ED Provider Notes (Signed)
[place] EMERGENCY DEPARTMENT AT [facility] Provider Note   CSN: [id number] Arrival date & time: 07/29/23  1300     History  Chief Complaint  Patient presents with   Rectal Bleeding   Abdominal Pain    Julie King is a 32 y.o. female who presents with rectal bleeding x 1 year that has worsened in the past 2 weeks.  She states her bleeding has been predominantly bright red however recently she noted dark tarry stool with clots.  He does have a history of hemorrhoids.  She denies any significant abdominal pain fevers chills, nausea vomiting or diarrhea.  She was seen at [facility] on 11/27.  CBC showed stable hemoglobin at 10.1 and was referred to GI outpatient.  Of rectal exam was not performed at that time.  With persistent symptoms patient presented today for further evaluation.   Rectal Bleeding Associated symptoms: abdominal pain   Abdominal Pain Associated symptoms: hematochezia        Home Medications Prior to Admission medications   Medication Sig Start Date End Date Taking? Authorizing Provider  acetaminophen (TYLENOL) 500 MG tablet Take 1,000 mg by mouth 2 (two) times daily as needed for mild pain, moderate pain, headache or fever.    [provider]  albuterol (VENTOLIN HFA) 108 (90 Base) MCG/ACT inhaler Inhale 2 puffs into the lungs every 6 (six) hours as needed for wheezing or shortness of breath (Cough). 06/14/22   Theadora Rama Scales, PA-C  fluconazole (DIFLUCAN) 150 MG tablet Take 1 tablet (150 mg total) by mouth every 3 (three) days as needed. Patient not taking: Reported on 11/15/2022 11/02/22   Margaretann Loveless, PA-C  hydrocortisone (ANUSOL-HC) 25 MG suppository Place 1 suppository (25 mg total) rectally 2 (two) times daily. 01/17/23   Margaretann Loveless, PA-C  ipratropium-albuterol (DUONEB) 0.5-2.5 (3) MG/3ML SOLN Take 3 mLs by nebulization 4 (four) times daily. Patient taking differently: Take 3 mLs by nebulization every 6 (six)  hours as needed (shortness of breath). 09/01/22   Minor, Vilinda Blanks, NP  mometasone-formoterol (DULERA) 200-5 MCG/ACT AERO Inhale 2 puffs into the lungs 2 (two) times daily. 09/01/22   Minor, Vilinda Blanks, NP  nicotine (NICODERM CQ - DOSED IN MG/24 HOURS) 14 mg/24hr patch Place onto the skin. 09/06/22   [provider]      Allergies    Percocet [oxycodone-acetaminophen] and Pineapple    Review of Systems   Review of Systems  Gastrointestinal:  Positive for abdominal pain and hematochezia.    Physical Exam Updated Vital Signs BP 137/71   Pulse 77   Temp 98.5 F (36.9 C) (Oral)   Resp 17   LMP 06/27/2023 (Exact Date)   SpO2 100%  Physical Exam Vitals and nursing note reviewed. Exam conducted with a chaperone present.  Constitutional:      General: She is not in acute distress.    Appearance: She is well-developed.  HENT:     Head: Normocephalic and atraumatic.  Eyes:     Conjunctiva/sclera: Conjunctivae normal.  Cardiovascular:     Rate and Rhythm: Normal rate and regular rhythm.     Heart sounds: No murmur heard. Pulmonary:     Effort: Pulmonary effort is normal. No respiratory distress.     Breath sounds: Normal breath sounds.  Abdominal:     General: Bowel sounds are normal. There is no distension.     Palpations: Abdomen is soft.     Comments: Mild epigastric tenderness.  No peritoneal signs.  No rebound or guarding.  No left lower quadrant tenderness  Genitourinary:    Rectum: Guaiac result negative. No tenderness.     Comments: Small nontender external hemorrhoid, not thrombosed Musculoskeletal:        General: No swelling.     Cervical back: Neck supple.  Skin:    General: Skin is warm and dry.     Capillary Refill: Capillary refill takes less than 2 seconds.  Neurological:     Mental Status: She is alert.  Psychiatric:        Mood and Affect: Mood normal.     ED Results / Procedures / Treatments   Labs (all labs ordered are listed, but only abnormal  results are displayed) Labs Reviewed  COMPREHENSIVE METABOLIC PANEL - Abnormal; Notable for the following components:      Result Value   AST 12 (*)    All other components within normal limits  CBC - Abnormal; Notable for the following components:   WBC 12.3 (*)    RBC 6.11 (*)    Hemoglobin 10.6 (*)    HCT 35.8 (*)    MCV 58.6 (*)    MCH 17.3 (*)    MCHC 29.6 (*)    RDW 20.6 (*)    All other components within normal limits  URINALYSIS, ROUTINE W REFLEX MICROSCOPIC - Abnormal; Notable for the following components:   Specific Gravity, Urine 1.040 (*)    Ketones, ur TRACE (*)    Protein, ur 30 (*)    Bacteria, UA RARE (*)    All other components within normal limits  LIPASE, BLOOD  PREGNANCY, URINE  OCCULT BLOOD X 1 CARD TO LAB, STOOL    EKG None  Radiology No results found.  Procedures Procedures    Medications Ordered in ED Medications - No data to display  ED Course/ Medical Decision Making/ A&P  EMERGENCY DEPARTMENT  US GUIDANCE EXAM Emergency Ultrasound:  US Guidance for Needle Guidance  INDICATIONS: Difficult vascular access Linear probe used in real-time to visualize location of needle entry through skin.   PERFORMED BY: Myself IMAGES ARCHIVED?: No LIMITATIONS: None VIEWS USED: Transverse INTERPRETATION: Right arm  Click here for ABCD2, HEART and other calculatorsREFRESH Note before signing :1}                              Medical Decision Making Amount and/or Complexity of Data Reviewed Labs: ordered.   This patient presents to the ED with chief complaint(s) of rectal bleeding with pertinent past medical history of hemorrhoids.  The complaint involves an extensive differential diagnosis and also carries with it a high risk of complications and morbidity.    The differential diagnosis includes  Upper vs Lower GI bleed, hemorrhoids, diverticulitis.   The initial plan is to  Will obtain basic labs, hemoccult  Additional history obtained: No  additional historians Records reviewed previous admission documents  Initial Assessment:   Patient is nontoxic-appearing, with a nonfocal abdominal exam.  Rectal bleeding could be multifactorial as she has known hemorrhoids, however she has been passing clots which is more concerning for GI bleed.  No left lower quadrant tenderness, fever or significant leukocytosis to suggest diverticulitis.   Independent ECG interpretation:  None  Independent labs interpretation:  The following labs were independently interpreted:  CBC demonstrates stable hemoglobin, downtrending mild leukocytosis Negative Hemoccult  Independent visualization and interpretation of imaging: No imaging indicated  Treatment and Reassessment: No medications  administered during visit  Consultations obtained:   none  Disposition:   Patient will be discharged home with close GI follow-up. The patient has been appropriately medically screened and/or stabilized in the ED. I have low suspicion for any other emergent medical condition which would require further screening, evaluation or treatment in the ED or require inpatient management. At time of discharge the patient is hemodynamically stable and in no acute distress. I have discussed work-up results and diagnosis with patient and answered all questions. Patient is agreeable with discharge plan. We discussed strict return precautions for returning to the emergency department and they verbalized understanding.     Social Determinants of Health:   none  This note was dictated with voice recognition software.  Despite best efforts at proofreading, errors may have occurred which can change the documentation meaning.          Final Clinical Impression(s) / ED Diagnoses Final diagnoses:  Rectal bleeding    Rx / DC Orders ED Discharge Orders     None         Halford Decamp, PA-C 07/29/23 1603    Rondel Baton, MD 08/02/23 [phone]

## 2023-07-29 NOTE — ED Notes (Signed)
 RN reviewed discharge instructions with pt. Pt verbalized understanding and had no further questions. VSS upon discharge.  

## 2023-07-29 NOTE — Discharge Instructions (Addendum)
It was a pleasure taking care of you this afternoon.  You were evaluated in the emergency room for rectal bleeding.  A rectal exam was performed which did demonstrate external hemorrhoid.  However as discussed I do not believe this to be the primary cause of your bleeding. You were provided resources for [facility] in assisting with finding a PCP.  You are additionally provided contact information for a gastroenterologist in [place].  Please contact us to see if they have any options for patients without medical insurance.  If you experience any new or worsening symptoms included lightheadedness or worsening bleeding please return to the emergency room.

## 2023-07-29 NOTE — ED Triage Notes (Signed)
Pt reports h/o rectal bleeding with hemmrhoids, and states bleeding has worsened in past 2 weeks, along with dull midline abdominal pain.

## 2023-07-31 LAB — TYPE AND SCREEN
ABO/RH(D): A POS
Antibody Screen: POSITIVE

## 2023-08-01 ENCOUNTER — Encounter: Payer: Self-pay | Admitting: Nurse Practitioner

## 2023-08-01 ENCOUNTER — Telehealth: Payer: Self-pay

## 2023-08-01 ENCOUNTER — Ambulatory Visit (INDEPENDENT_AMBULATORY_CARE_PROVIDER_SITE_OTHER): Payer: Self-pay | Admitting: Nurse Practitioner

## 2023-08-01 ENCOUNTER — Ambulatory Visit: Payer: Self-pay

## 2023-08-01 VITALS — BP 130/74 | HR 81 | Temp 97.4°F | Wt 325.8 lb

## 2023-08-01 DIAGNOSIS — K625 Hemorrhage of anus and rectum: Secondary | ICD-10-CM

## 2023-08-01 DIAGNOSIS — Z9889 Other specified postprocedural states: Secondary | ICD-10-CM

## 2023-08-01 DIAGNOSIS — Z09 Encounter for follow-up examination after completed treatment for conditions other than malignant neoplasm: Secondary | ICD-10-CM

## 2023-08-01 DIAGNOSIS — K644 Residual hemorrhoidal skin tags: Secondary | ICD-10-CM

## 2023-08-01 DIAGNOSIS — F17200 Nicotine dependence, unspecified, uncomplicated: Secondary | ICD-10-CM

## 2023-08-01 DIAGNOSIS — K649 Unspecified hemorrhoids: Secondary | ICD-10-CM | POA: Insufficient documentation

## 2023-08-01 DIAGNOSIS — D649 Anemia, unspecified: Secondary | ICD-10-CM

## 2023-08-01 MED ORDER — HYDROCORTISONE ACETATE 25 MG RE SUPP: 25.0000 mg | Freq: Two times a day (BID) | RECTAL | 0 refills | Status: AC

## 2023-08-01 NOTE — Assessment & Plan Note (Addendum)
 -   hydrocortisone (ANUSOL-HC) 25 MG suppository; Place 1 suppository (25 mg total) rectally 2 (two) times daily for 6 days.  Dispense: 12 suppository; Refill: 0 - CBC  Educational material for hemorrhoids provided Patient referred to GI

## 2023-08-01 NOTE — Assessment & Plan Note (Signed)
Hospital chart reviewed, including discharge summary Medications reconciled and reviewed with the patient in detail 

## 2023-08-01 NOTE — Assessment & Plan Note (Addendum)
Lab Results  Component Value Date   IRON 16 (L) 10/19/2022   TIBC 209 (L) 10/19/2022   FERRITIN 104 10/19/2022  Checking CBC, iron panel

## 2023-08-01 NOTE — Assessment & Plan Note (Signed)
Patient referred to GI Will most likely need a colonoscopy reChecking CBC Lab Results  Component Value Date   WBC 12.3 (H) 07/29/2023   HGB 10.6 (L) 07/29/2023   HCT 35.8 (L) 07/29/2023   MCV 58.6 (L) 07/29/2023   PLT 287 07/29/2023

## 2023-08-01 NOTE — Assessment & Plan Note (Signed)
Smokes half pack of cigarettes daily Smoking cessation encouraged

## 2023-08-01 NOTE — Telephone Encounter (Signed)
Patient left message on 07/31/2023, requesting return call. Left message on voicemail requesting a return call.

## 2023-08-01 NOTE — Patient Instructions (Signed)
1. Rectal bleeding  - Ambulatory referral to Gastroenterology  2. Encounter for examination following treatment at hospital   3. Tobacco use disorder  4. Anemia, unspecified type  - Iron, TIBC and Ferritin Panel  5. External hemorrhoid, bleeding  - hydrocortisone (ANUSOL-HC) 25 MG suppository; Place 1 suppository (25 mg total) rectally 2 (two) times daily for 6 days.  Dispense: 12 suppository; Refill: 0 - CBC    It is important that you exercise regularly at least 30 minutes 5 times a week as tolerated  Think about what you will eat, plan ahead. Choose " clean, green, fresh or frozen" over canned, processed or packaged foods which are more sugary, salty and fatty. 70 to 75% of food eaten should be vegetables and fruit. Three meals at set times with snacks allowed between meals, but they must be fruit or vegetables. Aim to eat over a 12 hour period , example 7 am to 7 pm, and STOP after  your last meal of the day. Drink water,generally about 64 ounces per day, no other drink is as healthy. Fruit juice is best enjoyed in a healthy way, by EATING the fruit.  Thanks for choosing [facility] we consider it a privelige to serve you.

## 2023-08-01 NOTE — Assessment & Plan Note (Signed)
Had VATS for drainage and decortication in February 2024 for parapneumonic effusion/empyema She reported using albuterol inhaler and DuoNeb as needed Reported chronic shortness of breath, wheezing, cough Smoking cessation encouraged

## 2023-08-01 NOTE — Progress Notes (Signed)
New Patient Office Visit  Subjective:  Patient ID: Julie King, female    DOB: 24-May-1991  Age: 32 y.o. MRN: [id number]  CC:  Chief Complaint  Patient presents with   Hospitalization Follow-up   Establish Care    HPI Julie King is a 32 y.o. female  has a past medical history of Atrial fibrillation (HCC) (2020), Complication of anesthesia, Family history of adverse reaction to anesthesia, Family history of breast cancer, Headache (07/25/2017), Hypertension, Irritable bowel syndrome, Morbidly obese (HCC) (07/25/2017), PONV (postoperative nausea and vomiting), QT prolongation, T wave inversion in EKG, and Tobacco abuse (10/14/2022).   Patient presents for hospitalization follow-up.  Patient was at the emergency room on 07/29/2023 for complaints of rectal bleeding for a year that has worsened in the past 2 weeks.  Small nontender external hemorrhoid not thrombosed was found on examination.  There was a concern for GI bleed because she stated that she has been passing clots, she was referred to [facility] but they cannot see her until January 2025.  She would like referral to GI specialist that can see her sooner.   Past Medical History:  Diagnosis Date   Atrial fibrillation (HCC) 2020   Complication of anesthesia    wakes up emotional   Family history of adverse reaction to anesthesia    mother has n & v   Family history of breast cancer    Headache 07/25/2017   Hypertension    Irritable bowel syndrome    Morbidly obese (HCC) 07/25/2017   PONV (postoperative nausea and vomiting)    QT prolongation    T wave inversion in EKG    Tobacco abuse 10/14/2022    Past Surgical History:  Procedure Laterality Date   BRONCHIAL BIOPSY  08/30/2022   Procedure: BRONCHIAL BIOPSIES;  Surgeon: Josephine Igo, DO;  Location: [facility];  Service: Pulmonary;;   BRONCHIAL BRUSHINGS  08/30/2022   Procedure: BRONCHIAL BRUSHINGS;  Surgeon: Josephine Igo, DO;  Location: [facility];   Service: Pulmonary;;   BRONCHIAL NEEDLE ASPIRATION BIOPSY  08/30/2022   Procedure: BRONCHIAL NEEDLE ASPIRATION BIOPSIES;  Surgeon: Josephine Igo, DO;  Location: [facility];  Service: Pulmonary;;   CESAREAN SECTION     CESAREAN SECTION N/A 01/31/2020   Procedure: CESAREAN SECTION;  Surgeon: Hermina Staggers, MD;  Location: [facility] ORS;  Service: Obstetrics;  Laterality: N/A;   CHOLECYSTECTOMY     DECORTICATION Left 10/21/2022   Procedure: DECORTICATION;  Surgeon: Loreli Slot, MD;  Location: [facility] OR;  Service: Thoracic;  Laterality: Left;   HERNIA REPAIR     INTERCOSTAL NERVE BLOCK Left 10/06/2022   Procedure: INTERCOSTAL NERVE BLOCK;  Surgeon: Loreli Slot, MD;  Location: [facility] OR;  Service: Thoracic;  Laterality: Left;   IR THORACENTESIS ASP PLEURAL SPACE W/IMG GUIDE  10/17/2022   PLEURAL EFFUSION DRAINAGE Left 10/21/2022   Procedure: DRAINAGE OF PLEURAL EFFUSION;  Surgeon: Loreli Slot, MD;  Location: [facility] OR;  Service: Thoracic;  Laterality: Left;   TONSILLECTOMY     VIDEO ASSISTED THORACOSCOPY Left 10/21/2022   Procedure: VIDEO ASSISTED THORACOSCOPY;  Surgeon: Loreli Slot, MD;  Location: [facility] OR;  Service: Thoracic;  Laterality: Left;   VIDEO BRONCHOSCOPY WITH ENDOBRONCHIAL ULTRASOUND N/A 10/06/2022   Procedure: VIDEO BRONCHOSCOPY WITH ENDOBRONCHIAL ULTRASOUND;  Surgeon: Loreli Slot, MD;  Location: [facility] OR;  Service: Thoracic;  Laterality: N/A;    Family History  Problem Relation Age of Onset   Hypertension Other  Diabetes Other    Crohn's disease Mother    Hypertension Mother    Thyroid disease Father    Diabetes Father    Breast cancer Maternal Aunt 30   Breast cancer Paternal Grandmother        dx under 20   Breast cancer Other 20       MGMs sister, currently has stage IV    Social History   Socioeconomic History   Marital status: Single    Spouse name: Not on file   Number of children: 2   Years of education: Not on file   Highest  education level: High school graduate  Occupational History   Occupation: [employer]  Tobacco Use   Smoking status: Every Day    Current packs/day: 0.50    Average packs/day: 0.5 packs/day for 10.0 years (5.0 ttl pk-yrs)    Types: Cigarettes   Smokeless tobacco: Never   Tobacco comments:    Quit smoking on 10/05/22 -- 11/10/2022 [facility]  Vaping Use   Vaping status: Never Used  Substance and Sexual Activity   Alcohol use: Yes    Comment: rarely   Drug use: No   Sexual activity: Yes    Comment: with female  Other Topics Concern   Not on file  Social History Narrative   Lives with partner   Caffeine use: Coffee daily   Soda sometimes   Right handed    Has one daughter   Social Determinants of Health   Financial Resource Strain: Not on file  Food Insecurity: No Food Insecurity (07/17/2023)   Hunger Vital Sign    Worried About Running Out of Food in the Last Year: Never true    Ran Out of Food in the Last Year: Never true  Transportation Needs: No Transportation Needs (07/17/2023)   PRAPARE - Administrator, Civil Service (Medical): No    Lack of Transportation (Non-Medical): No  Physical Activity: Inactive (11/09/2017)   Exercise Vital Sign    Days of Exercise per Week: 0 days    Minutes of Exercise per Session: 0 min  Stress: Stress Concern Present (07/05/2022)   Received from [facility] - Occupational Stress Questionnaire    Feeling of Stress : Very much  Social Connections: Unknown (01/06/2022)   [facility] from [facility]   Social Network    Social Network: Not on file  Intimate Partner Violence: Low Risk  (09/21/2022)   Received from [facility] visits prior to 10/29/2022., [facility] visits prior to 10/29/2022.   Safety    How often does anyone, including family and friends, physically hurt you?: Never    How often does anyone,  including family and friends, insult or talk down to you?: Never    How often does anyone, including family and friends, threaten you with harm?: Never    How often does anyone, including family and friends, scream or curse at you?: Never    ROS Review of Systems  Constitutional:  Negative for appetite change, chills, fatigue and fever.  HENT:  Negative for congestion, postnasal drip, rhinorrhea and sneezing.   Respiratory:  Positive for cough, shortness of breath and wheezing.        Chronic  Cardiovascular:  Negative for chest pain, palpitations and leg swelling.  Gastrointestinal:  Positive for anal bleeding. Negative for abdominal pain, constipation, nausea and vomiting.  Genitourinary:  Negative  for difficulty urinating, dysuria, flank pain and frequency.  Musculoskeletal:  Negative for arthralgias, back pain, joint swelling and myalgias.  Skin:  Negative for color change, pallor, rash and wound.  Neurological:  Negative for dizziness, facial asymmetry, weakness, numbness and headaches.  Psychiatric/Behavioral:  Negative for behavioral problems, confusion, self-injury and suicidal ideas.     Objective:   Today's Vitals: BP 130/74   Pulse 81   Temp (!) 97.4 F (36.3 C)   Wt (!) 325 lb 12.8 oz (147.8 kg)   LMP 06/27/2023 (Exact Date)   SpO2 100%   BMI 51.03 kg/m   Physical Exam Vitals and nursing note reviewed.  Constitutional:      General: She is not in acute distress.    Appearance: Normal appearance. She is obese. She is not ill-appearing, toxic-appearing or diaphoretic.  HENT:     Mouth/Throat:     Mouth: Mucous membranes are moist.     Pharynx: Oropharynx is clear. No oropharyngeal exudate or posterior oropharyngeal erythema.  Eyes:     General: No scleral icterus.       Right eye: No discharge.        Left eye: No discharge.     Extraocular Movements: Extraocular movements intact.     Conjunctiva/sclera: Conjunctivae normal.  Cardiovascular:     Rate and  Rhythm: Normal rate and regular rhythm.     Pulses: Normal pulses.     Heart sounds: Normal heart sounds. No murmur heard.    No friction rub. No gallop.  Pulmonary:     Effort: Pulmonary effort is normal. No respiratory distress.     Breath sounds: Normal breath sounds. No stridor. No wheezing, rhonchi or rales.  Chest:     Chest wall: No tenderness.  Abdominal:     General: There is no distension.     Palpations: Abdomen is soft.     Tenderness: There is no abdominal tenderness. There is no right CVA tenderness, left CVA tenderness or guarding.  Musculoskeletal:        General: No swelling, tenderness, deformity or signs of injury.     Right lower leg: No edema.     Left lower leg: No edema.  Skin:    General: Skin is warm and dry.     Capillary Refill: Capillary refill takes less than 2 seconds.     Coloration: Skin is not jaundiced or pale.     Findings: No bruising, erythema or lesion.  Neurological:     Mental Status: She is alert and oriented to person, place, and time.     Motor: No weakness.     Coordination: Coordination normal.     Gait: Gait normal.  Psychiatric:        Mood and Affect: Mood normal.        Behavior: Behavior normal.        Thought Content: Thought content normal.        Judgment: Judgment normal.     Assessment & Plan:   Problem List Items Addressed This Visit       Cardiovascular and Mediastinum   External hemorrhoid, bleeding      - hydrocortisone (ANUSOL-HC) 25 MG suppository; Place 1 suppository (25 mg total) rectally 2 (two) times daily for 6 days.  Dispense: 12 suppository; Refill: 0 - CBC  Educational material for hemorrhoids provided Patient referred to GI      Relevant Medications   aspirin EC 325 MG tablet   hydrocortisone (ANUSOL-HC) 25 MG  suppository   Other Relevant Orders   CBC     Digestive   Rectal bleeding - Primary    Patient referred to GI Will most likely need a colonoscopy reChecking CBC Lab Results   Component Value Date   WBC 12.3 (H) 07/29/2023   HGB 10.6 (L) 07/29/2023   HCT 35.8 (L) 07/29/2023   MCV 58.6 (L) 07/29/2023   PLT 287 07/29/2023         Relevant Orders   Ambulatory referral to Gastroenterology     Other   Tobacco use disorder    Smokes half pack of cigarettes daily Smoking cessation encouraged      Encounter for examination following treatment at hospital    Hospital chart reviewed, including discharge summary Medications reconciled and reviewed with the patient in detail       Anemia    Lab Results  Component Value Date   IRON 16 (L) 10/19/2022   TIBC 209 (L) 10/19/2022   FERRITIN 104 10/19/2022  Checking CBC, iron panel      Relevant Orders   Iron, TIBC and Ferritin Panel   S/P robot-assisted surgical procedure    Had VATS for drainage and decortication in February 2024 for parapneumonic effusion/empyema She reported using albuterol inhaler and DuoNeb as needed Reported chronic shortness of breath, wheezing, cough Smoking cessation encouraged       Outpatient Encounter Medications as of 08/01/2023  Medication Sig   acetaminophen (TYLENOL) 500 MG tablet Take 1,000 mg by mouth 2 (two) times daily as needed for mild pain, moderate pain, headache or fever.   albuterol (VENTOLIN HFA) 108 (90 Base) MCG/ACT inhaler Inhale 2 puffs into the lungs every 6 (six) hours as needed for wheezing or shortness of breath (Cough).   ipratropium-albuterol (DUONEB) 0.5-2.5 (3) MG/3ML SOLN Take 3 mLs by nebulization 4 (four) times daily. (Patient taking differently: Take 3 mLs by nebulization every 6 (six) hours as needed (shortness of breath).)   aspirin EC 325 MG tablet Take 325 mg by mouth every 4 (four) hours as needed. (Patient not taking: Reported on 08/01/2023)   fluticasone (FLONASE) 50 MCG/ACT nasal spray Place 2 sprays into both nostrils. (Patient not taking: Reported on 08/01/2023)   hydrocortisone (ANUSOL-HC) 25 MG suppository Place 1 suppository (25 mg  total) rectally 2 (two) times daily for 6 days.   mometasone-formoterol (DULERA) 200-5 MCG/ACT AERO Inhale 2 puffs into the lungs 2 (two) times daily. (Patient not taking: Reported on 08/01/2023)   nicotine (NICODERM CQ - DOSED IN MG/24 HOURS) 14 mg/24hr patch Place onto the skin. (Patient not taking: Reported on 08/01/2023)   [DISCONTINUED] fluconazole (DIFLUCAN) 150 MG tablet Take 1 tablet (150 mg total) by mouth every 3 (three) days as needed. (Patient not taking: Reported on 11/15/2022)   [DISCONTINUED] hydrocortisone (ANUSOL-HC) 25 MG suppository Place 1 suppository (25 mg total) rectally 2 (two) times daily. (Patient not taking: Reported on 08/01/2023)   No facility-administered encounter medications on file as of 08/01/2023.    Follow-up: Return in about 4 weeks (around 08/29/2023) for ANEMIA.   Donell Beers, FNP

## 2023-08-01 NOTE — Telephone Encounter (Signed)
  Chief Complaint: Rectal bleeding Symptoms: Rectal bleeding Frequency: 1 year worsening the past few weeks Pertinent Negatives: Patient denies  Disposition: [x] ED /[] Urgent Care (no appt availability in office) / [] Appointment(In office/virtual)/ []  [place] [facility]/ [] Home Care/ [] Refused Recommended Disposition /[] [place] [facility]/ []  Follow-up with PCP Additional Notes: Pt has been seen at ED 2x for this and released. She was advised to follow up with [facility]. First new pt appt is January. Pt does not have insurance and has concerns about financial responsibility. Gave pt phone number for financial office. Called other community clinics and was able to connect with [facility] - transferred call to [facility] for scheduling.  Advised pt to return to ED for care today.     Reason for Disposition  [1] MODERATE rectal bleeding (small blood clots, passing blood without stool, or toilet water turns red) AND [2] more than once a day  Answer Assessment - Initial Assessment Questions 1. APPEARANCE of BLOOD: "What color is it?" "Is it passed separately, on the surface of the stool, or mixed in with the stool?"      Hard to say 2. AMOUNT: "How much blood was passed?"      A good amount 3. FREQUENCY: "How many times has blood been passed with the stools?"      All the time 4. ONSET: "When was the blood first seen in the stools?" (Days or weeks)      1 year 5. DIARRHEA: "Is there also some diarrhea?" If Yes, ask: "How many diarrhea stools in the past 24 hours?"      Yes - thin stools 6. CONSTIPATION: "Do you have constipation?" If Yes, ask: "How bad is it?"     At times 7. RECURRENT SYMPTOMS: "Have you had blood in your stools before?" If Yes, ask: "When was the last time?" and "What happened that time?"      yes 8. BLOOD THINNERS: "Do you take any blood thinners?" (e.g., Coumadin/warfarin, Pradaxa/dabigatran, aspirin)     no 9. OTHER SYMPTOMS: "Do you have any other  symptoms?"  (e.g., abdomen pain, vomiting, dizziness, fever)     Black tarry stool - as well as dark red blood 10. PREGNANCY: "Is there any chance you are pregnant?" "When was your last menstrual period?"       no  Protocols used: Rectal Bleeding-A-AH

## 2023-08-02 ENCOUNTER — Other Ambulatory Visit (HOSPITAL_COMMUNITY): Payer: Self-pay | Admitting: Nurse Practitioner

## 2023-08-02 DIAGNOSIS — D500 Iron deficiency anemia secondary to blood loss (chronic): Secondary | ICD-10-CM

## 2023-08-02 LAB — IRON,TIBC AND FERRITIN PANEL
Ferritin: 34 ng/mL (ref 15–150)
Iron Saturation: 10 % — ABNORMAL LOW (ref 15–55)
Iron: 25 ug/dL — ABNORMAL LOW (ref 27–159)
Total Iron Binding Capacity: 257 ug/dL (ref 250–450)
UIBC: 232 ug/dL (ref 131–425)

## 2023-08-02 LAB — CBC
Hematocrit: 35.8 % (ref 34.0–46.6)
Hemoglobin: 9.9 g/dL — ABNORMAL LOW (ref 11.1–15.9)
MCH: 17.2 pg — ABNORMAL LOW (ref 26.6–33.0)
MCHC: 27.7 g/dL — ABNORMAL LOW (ref 31.5–35.7)
MCV: 62 fL — ABNORMAL LOW (ref 79–97)
Platelets: 364 10*3/uL (ref 150–450)
RBC: 5.75 x10E6/uL — ABNORMAL HIGH (ref 3.77–5.28)
RDW: 21.2 % — ABNORMAL HIGH (ref 11.7–15.4)
WBC: 11.7 10*3/uL — ABNORMAL HIGH (ref 3.4–10.8)

## 2023-08-02 MED ORDER — FERROUS SULFATE 325 (65 FE) MG PO TBEC: 325.0000 mg | DELAYED_RELEASE_TABLET | Freq: Every day | ORAL | 3 refills | Status: DC

## 2023-08-07 ENCOUNTER — Ambulatory Visit: Payer: Self-pay | Admitting: Nurse Practitioner

## 2023-08-07 NOTE — Telephone Encounter (Addendum)
Call was disconnected, attempted call back.  Copied from CRM [id number]. Topic: Clinical - Red Word Triage >> Aug 07, 2023 12:47 PM Julie King wrote: Red Word that prompted transfer to Nurse Triage: Rectal bleeding, blood clots started a month. Feb will have colonoscopy and EGD.  Now having pain on R rib radiating to the back started 2 days ago, the pain comes and go. Wants to speak with the nurse [phone]

## 2023-08-07 NOTE — Telephone Encounter (Signed)
  Chief Complaint: rectal bleeding w/ clots, chest pain Symptoms: rectal bleeding, 4/10 right upper quad pain, mid chest pain, SOB that has increased Frequency: ongoing for a while now - pt has been scheduled for colonoscopy and endoscopy Pertinent Negatives: Patient denies n/v Disposition: [x] ED /[] Urgent Care (no appt availability in office) / [] Appointment(In office/virtual)/ []  [place] [facility]/ [] Home Care/ [] Refused Recommended Disposition /[] [place] [facility]/ []  Follow-up with PCP Additional Notes: pt stated bleeding when pooping sometimes enough to change the color in toilet and saturates the tissue, 4/10 upper right quad pain that radiates to back, mid chest pain, SOB that has increased, pt states history of Afib, Nurse recommended,  ED: pt verbalized understanding.

## 2023-08-07 NOTE — Telephone Encounter (Signed)
Reason for Disposition  SEVERE chest pain  [1] Chest pain lasts > 5 minutes AND [2] occurred in past 3 days (72 hours) (Exception: Feels exactly the same as previously diagnosed heartburn and has accompanying sour taste in mouth.)  Answer Assessment - Initial Assessment Questions 1. LOCATION: "Where does it hurt?"   Rectal bleeding and blood clots, upper right quad pain, mid chest pain, SOB 2. RADIATION: "Does the pain go anywhere else?" (e.g., into neck, jaw, arms, back)     4/10 Upper right quad pain that radiates to back 3. ONSET: "When did the chest pain begin?" (Minutes, hours or days)      Mid chest pain ongoing since left chest mass removed   4. PATTERN: "Does the pain come and go, or has it been constant since it started?"  "Does it get worse with exertion?"      Mid chest pain ongoing and right quad pain ongoing 5. DURATION: "How long does it last" (e.g., seconds, minutes, hours)     N/a 6. SEVERITY: "How bad is the pain?"  (e.g., Scale 1-10; mild, moderate, or severe)    - MILD (1-3): doesn't interfere with normal activities     - MODERATE (4-7): interferes with normal activities or awakens from sleep    - SEVERE (8-10): excruciating pain, unable to do any normal activities       4/10 7. CARDIAC RISK FACTORS: "Do you have any history of heart problems or risk factors for heart disease?" (e.g., angina, prior heart attack; diabetes, high blood pressure, high cholesterol, smoker, or strong family history of heart disease)     Afib 8. PULMONARY RISK FACTORS: "Do you have any history of lung disease?"  (e.g., blood clots in lung, asthma, emphysema, birth control pills)     N/a 9. CAUSE: "What do you think is causing the chest pain?"     unknown 10. OTHER SYMPTOMS: "Do you have any other symptoms?" (e.g., dizziness, nausea, vomiting, sweating, fever, difficulty breathing, cough)       SOB and SOB has increased recently 11. PREGNANCY: "Is there any chance you are pregnant?" "When was  your last menstrual period?"       unknown  Protocols used: Chest Pain-A-AH

## 2023-08-10 NOTE — Telephone Encounter (Signed)
No answer lvm for pt to call back to make an appt if she would like. KH

## 2023-08-29 ENCOUNTER — Ambulatory Visit: Payer: Self-pay | Admitting: Nurse Practitioner

## 2023-09-05 ENCOUNTER — Ambulatory Visit: Payer: Self-pay

## 2023-09-07 ENCOUNTER — Other Ambulatory Visit (HOSPITAL_COMMUNITY)
Admission: RE | Admit: 2023-09-07 | Discharge: 2023-09-07 | Disposition: A | Discharge status: Home / Self Care | Payer: Self-pay | Source: Ambulatory Visit | Attending: Hematology and Oncology | Admitting: Hematology and Oncology

## 2023-09-07 ENCOUNTER — Ambulatory Visit: Payer: Self-pay | Attending: Hematology and Oncology | Admitting: Hematology and Oncology

## 2023-09-07 VITALS — BP 124/81 | Wt 328.0 lb

## 2023-09-07 DIAGNOSIS — R87612 Low grade squamous intraepithelial lesion on cytologic smear of cervix (LGSIL): Secondary | ICD-10-CM | POA: Insufficient documentation

## 2023-09-07 DIAGNOSIS — Z01812 Encounter for preprocedural laboratory examination: Secondary | ICD-10-CM

## 2023-09-07 NOTE — Progress Notes (Signed)
 [facility]   GYNECOLOGY CLINIC COLPOSCOPY PROCEDURE NOTE  Julie King is a 33 y.o. [phone] here for colposcopy for  LGSIL/ HPV-  pap smear on 07/17/2023. Discussed role for HPV in cervical dysplasia, need for surveillance.  Patient given informed consent, signed copy in the chart, time out was performed.  Placed in lithotomy position. Cervix viewed with speculum and colposcope after application of acetic acid.   Colposcopy adequate? Yes  no visible lesions.  ECC specimen obtained. All specimens were labelled and sent to pathology.  Patient was given post procedure instructions.  Will follow up pathology and manage accordingly.  Routine preventative health maintenance measures emphasized.   Harl Setter A, NP 09/07/2023 1:15 PM

## 2023-09-13 ENCOUNTER — Telehealth: Payer: Self-pay

## 2023-09-13 LAB — SURGICAL PATHOLOGY

## 2023-09-13 NOTE — Telephone Encounter (Signed)
 Pt has returned my call and been advised of her results and follow-up recommendation. Pt expressed understanding of this information.

## 2023-09-13 NOTE — Telephone Encounter (Signed)
 I have left pt a detailed message with her results and advising to repeat her PAP in one year. She was invited to contact [facility] at that time should she continue meet the qualifications for the program. Pt was also advised if she has any questions/concerns regarding her results, she can contact us . Phone number was provided.  Also, a MyChart message has been sent to pt as well.

## 2023-11-09 ENCOUNTER — Telehealth: Payer: Self-pay | Admitting: Physician Assistant

## 2023-11-09 DIAGNOSIS — K047 Periapical abscess without sinus: Secondary | ICD-10-CM

## 2023-11-09 MED ORDER — CLINDAMYCIN HCL 300 MG PO CAPS: 300.0000 mg | ORAL_CAPSULE | Freq: Three times a day (TID) | ORAL | 0 refills | Status: AC

## 2023-11-09 MED ORDER — NAPROXEN 500 MG PO TABS: 500.0000 mg | ORAL_TABLET | Freq: Two times a day (BID) | ORAL | 0 refills | Status: DC

## 2023-11-09 NOTE — Progress Notes (Signed)
 I have spent 5 minutes in review of e-visit questionnaire, review and updating patient chart, medical decision making and response to patient.   Piedad Climes, PA-C

## 2023-11-09 NOTE — Progress Notes (Signed)

## 2024-01-15 ENCOUNTER — Ambulatory Visit: Payer: Self-pay

## 2024-01-15 ENCOUNTER — Encounter: Payer: Self-pay | Admitting: Emergency Medicine

## 2024-01-15 ENCOUNTER — Ambulatory Visit
Admission: EM | Admit: 2024-01-15 | Discharge: 2024-01-15 | Disposition: A | Discharge status: Home / Self Care | Payer: Self-pay | Attending: Emergency Medicine | Admitting: Emergency Medicine

## 2024-01-15 ENCOUNTER — Telehealth: Payer: Self-pay | Admitting: Emergency Medicine

## 2024-01-15 DIAGNOSIS — R051 Acute cough: Secondary | ICD-10-CM

## 2024-01-15 MED ORDER — BENZONATATE 100 MG PO CAPS: 100.0000 mg | ORAL_CAPSULE | Freq: Three times a day (TID) | ORAL | 0 refills | Status: DC

## 2024-01-15 MED ORDER — AMOXICILLIN-POT CLAVULANATE 875-125 MG PO TABS
1.0000 | ORAL_TABLET | Freq: Two times a day (BID) | ORAL | 0 refills | Status: DC
Start: 2024-01-15 — End: 2024-03-07

## 2024-01-15 NOTE — ED Provider Notes (Signed)
 Julie King    CSN: [id number] Arrival date & time: 01/15/24  1411      History   Chief Complaint Chief Complaint  Patient presents with   Cough    HPI Julie King is a 33 y.o. female.   Patient presents for evaluation of intermittent productive cough present for 1 month.  Over the past 2 weeks symptoms have worsened with associated shortness of breath at rest and wheezing.  Endorses today there was blood within sputum.  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  Has been using nebulizer and inhaler without improvement.  Denies fever, URI symptoms.  Past Medical History:  Diagnosis Date   Atrial fibrillation (HCC) 2020   Complication of anesthesia    wakes up emotional   Family history of adverse reaction to anesthesia    mother has n & v   Family history of breast cancer    Headache 07/25/2017   Hypertension    Irritable bowel syndrome    Morbidly obese (HCC) 07/25/2017   PONV (postoperative nausea and vomiting)    QT prolongation    T wave inversion in EKG    Tobacco abuse 10/14/2022    Patient Active Problem List   Diagnosis Date Noted   Hemorrhoids 08/01/2023   Rectal bleeding 08/01/2023   Encounter for examination following treatment at hospital 08/01/2023   Anemia 08/01/2023   External hemorrhoid, bleeding 08/01/2023   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   Pneumonia 10/15/2022   Healthcare-associated pneumonia 10/14/2022   Pleural effusion 10/14/2022   Microcytic hypochromic anemia 10/14/2022   Tobacco use disorder 10/14/2022   Mass of left lung 10/06/2022   Hemoptysis 08/28/2022   Lung mass 08/28/2022   Family history of breast cancer    Essential hypertension 03/16/2020   History of atrial fibrillation 11/28/2019   Headache 07/25/2017   Morbidly obese (HCC) 07/25/2017    Past Surgical History:  Procedure Laterality Date   BRONCHIAL BIOPSY   08/30/2022   Procedure: BRONCHIAL BIOPSIES;  Surgeon: Prudy Brownie, DO;  Location: [facility];  Service: Pulmonary;;   BRONCHIAL BRUSHINGS  08/30/2022   Procedure: BRONCHIAL BRUSHINGS;  Surgeon: Prudy Brownie, DO;  Location: [facility];  Service: Pulmonary;;   BRONCHIAL NEEDLE ASPIRATION BIOPSY  08/30/2022   Procedure: BRONCHIAL NEEDLE ASPIRATION BIOPSIES;  Surgeon: Prudy Brownie, DO;  Location: [facility];  Service: Pulmonary;;   CESAREAN SECTION     CESAREAN SECTION N/A 01/31/2020   Procedure: CESAREAN SECTION;  Surgeon: Othelia Blinks, MD;  Location: [facility];  Service: Obstetrics;  Laterality: N/A;   CHOLECYSTECTOMY     DECORTICATION Left 10/21/2022   Procedure: DECORTICATION;  Surgeon: Zelphia Higashi, MD;  Location: [facility] OR;  Service: Thoracic;  Laterality: Left;   HERNIA REPAIR     INTERCOSTAL NERVE BLOCK Left 10/06/2022   Procedure: INTERCOSTAL NERVE BLOCK;  Surgeon: Zelphia Higashi, MD;  Location: [facility] OR;  Service: Thoracic;  Laterality: Left;   IR THORACENTESIS ASP PLEURAL SPACE W/IMG GUIDE  10/17/2022   PLEURAL EFFUSION DRAINAGE Left 10/21/2022   Procedure: DRAINAGE OF PLEURAL EFFUSION;  Surgeon: Zelphia Higashi, MD;  Location: [facility] OR;  Service: Thoracic;  Laterality: Left;   TONSILLECTOMY     VIDEO ASSISTED THORACOSCOPY Left 10/21/2022   Procedure: VIDEO ASSISTED THORACOSCOPY;  Surgeon: Zelphia Higashi, MD;  Location: [facility] OR;  Service: Thoracic;  Laterality: Left;   VIDEO BRONCHOSCOPY WITH  ENDOBRONCHIAL ULTRASOUND N/A 10/06/2022   Procedure: VIDEO BRONCHOSCOPY WITH ENDOBRONCHIAL ULTRASOUND;  Surgeon: Zelphia Higashi, MD;  Location: [facility] OR;  Service: Thoracic;  Laterality: N/A;    OB History     Gravida  2   Para  2   Term  1   Preterm  1   AB      Living  2      SAB      IAB      Ectopic      Multiple  0   Live Births  2            Home Medications    Prior to Admission medications   Medication Sig Start Date End Date  Taking? Authorizing Provider  acetaminophen  (TYLENOL ) 500 MG tablet Take 1,000 mg by mouth 2 (two) times daily as needed for mild pain, moderate pain, headache or fever.    [provider]  albuterol  (VENTOLIN  HFA) 108 (90 Base) MCG/ACT inhaler Inhale 2 puffs into the lungs every 6 (six) hours as needed for wheezing or shortness of breath (Cough). 06/14/22   Eloise Hake Scales, PA-C  aspirin  EC 325 MG tablet Take 325 mg by mouth every 4 (four) hours as needed. Patient not taking: Reported on 09/07/2023 04/28/19   [provider]  ferrous sulfate  325 (65 FE) MG EC tablet Take 1 tablet (325 mg total) by mouth daily. Patient not taking: Reported on 09/07/2023 08/02/23   Paseda, Folashade R, FNP  ipratropium-albuterol  (DUONEB) 0.5-2.5 (3) MG/3ML SOLN Take 3 mLs by nebulization 4 (four) times daily. Patient taking differently: Take 3 mLs by nebulization every 6 (six) hours as needed (shortness of breath). 09/01/22   Minor, Lavelle Posey, NP  naproxen  (NAPROSYN ) 500 MG tablet Take 1 tablet (500 mg total) by mouth 2 (two) times daily with a meal. 11/09/23   Farris Hong, PA-C    Family History Family History  Problem Relation Age of Onset   Crohn's disease Mother    Hypertension Mother    Thyroid  disease Father    Diabetes Father    Breast cancer Maternal Aunt 30   Breast cancer Paternal Grandmother        dx under 50   Hypertension Other    Diabetes Other    Breast cancer Other 57       MGMs sister, currently has stage IV    Social History Social History   Tobacco Use   Smoking status: Every Day    Current packs/day: 0.50    Average packs/day: 0.5 packs/day for 10.0 years (5.0 ttl pk-yrs)    Types: Cigarettes   Smokeless tobacco: Never   Tobacco comments:    Quit smoking on 10/05/22 -- 11/10/2022 [facility]  Vaping Use   Vaping status: Some Days  Substance Use Topics   Alcohol use: Yes    Comment: rarely   Drug use: No     Allergies   Percocet [oxycodone -acetaminophen ]  and Pineapple   Review of Systems Review of Systems   Physical Exam Triage Vital Signs ED Triage Vitals  Encounter Vitals Group     BP 01/15/24 1522 136/86     Systolic BP Percentile --      Diastolic BP Percentile --      Pulse Rate 01/15/24 1522 96     Resp 01/15/24 1522 18     Temp 01/15/24 1522 97.6 F (36.4 C)     Temp Source 01/15/24 1522 Temporal     SpO2  01/15/24 1522 98 %     Weight --      Height --      Head Circumference --      Peak Flow --      Pain Score 01/15/24 1520 0     Pain Loc --      Pain Education --      Exclude from Growth Chart --    No data found.  Updated Vital Signs BP 136/86 (BP Location: Right Arm)   Pulse 96   Temp 97.6 F (36.4 C) (Temporal)   Resp 18   LMP 01/03/2024 (Approximate)   SpO2 98%   Visual Acuity Right Eye Distance:   Left Eye Distance:   Bilateral Distance:    Right Eye Near:   Left Eye Near:    Bilateral Near:     Physical Exam Constitutional:      Appearance: Normal appearance.  Eyes:     Extraocular Movements: Extraocular movements intact.  Cardiovascular:     Rate and Rhythm: Normal rate and regular rhythm.     Pulses: Normal pulses.     Heart sounds: Normal heart sounds.  Pulmonary:     Effort: Pulmonary effort is normal.     Breath sounds: Normal breath sounds.  Neurological:     Mental Status: She is alert and oriented to person, place, and time. Mental status is at baseline.      UC Treatments / Results  Labs (all labs ordered are listed, but only abnormal results are displayed) Labs Reviewed - No data to display  EKG   Radiology No results found.  Procedures Procedures (including critical care time)  Medications Ordered in UC Medications - No data to display  Initial Impression / Assessment and Plan / UC Course  I have reviewed the triage vital signs and the nursing notes.  Pertinent labs & imaging results that were available during my care of the patient were reviewed by me  and considered in my medical decision making (see chart for details).  Acute cough  Lungs clear, O2 saturation 98% on room air, no signs of distress nontoxic-appearing, prescribed Tessalon  at this time as x-rays pending, will make adjustments to treatment based on results, if concern for mass today patient will be sent to the nearest emergency department for higher level imaging and further evaluation Final Clinical Impressions(s) / UC Diagnoses   Final diagnoses:  Acute cough   Discharge Instructions   None    ED Prescriptions   None    PDMP not reviewed this encounter.   Reena Canning, NP 01/15/24 1545

## 2024-01-15 NOTE — Discharge Instructions (Signed)
 Is pending, you will be notified of results via telephone and additional treatment as discussed  In the meantime you may use Tessalon  every 8 hours as needed to help calm cough, may use over-the-counter Delsym  in addition to this  For cough: honey 1/2 to 1 teaspoon (you can dilute the honey in water or another fluid).  You can also use guaifenesin  and dextromethorphan  for cough. You can use a humidifier for chest congestion and cough.  If you don't have a humidifier, you can sit in the bathroom with the hot shower running.      For sore throat: try warm salt water gargles, cepacol lozenges, throat spray, warm tea or water with lemon/honey, popsicles or ice, or OTC cold relief medicine for throat discomfort.   For congestion: take a daily anti-histamine like Zyrtec , Claritin, and a oral decongestant, such as pseudoephedrine .  You can also use Flonase  1-2 sprays in each nostril daily.   It is important to stay hydrated: drink plenty of fluids (water, gatorade/powerade/pedialyte, juices, or teas) to keep your throat moisturized and help further relieve irritation/discomfort.

## 2024-01-15 NOTE — ED Triage Notes (Signed)
 Pt had cough for a month that is a mixture of dry and productive. Today coughed up some blood. Reports that when this happened before (09/2022) had lung mass. 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 Reports will get spasms on left side and "voice sounds weird".  Hasn't taken anything for cough

## 2024-01-15 NOTE — Telephone Encounter (Signed)
 Reported x-ray results via telephone, 2 patient identifiers use, negative, adding on antibiotic as symptoms have been present for 1 month

## 2024-01-16 ENCOUNTER — Ambulatory Visit (HOSPITAL_COMMUNITY): Payer: Self-pay

## 2024-03-07 ENCOUNTER — Other Ambulatory Visit: Payer: Self-pay

## 2024-03-07 ENCOUNTER — Emergency Department (HOSPITAL_BASED_OUTPATIENT_CLINIC_OR_DEPARTMENT_OTHER)
Admission: EM | Admit: 2024-03-07 | Discharge: 2024-03-07 | Disposition: A | Discharge status: Home / Self Care | Attending: Emergency Medicine | Admitting: Emergency Medicine

## 2024-03-07 ENCOUNTER — Emergency Department (HOSPITAL_BASED_OUTPATIENT_CLINIC_OR_DEPARTMENT_OTHER)

## 2024-03-07 ENCOUNTER — Other Ambulatory Visit (HOSPITAL_BASED_OUTPATIENT_CLINIC_OR_DEPARTMENT_OTHER): Payer: Self-pay

## 2024-03-07 ENCOUNTER — Encounter (HOSPITAL_BASED_OUTPATIENT_CLINIC_OR_DEPARTMENT_OTHER): Payer: Self-pay | Admitting: Emergency Medicine

## 2024-03-07 DIAGNOSIS — R109 Unspecified abdominal pain: Secondary | ICD-10-CM

## 2024-03-07 DIAGNOSIS — F17210 Nicotine dependence, cigarettes, uncomplicated: Secondary | ICD-10-CM | POA: Diagnosis not present

## 2024-03-07 DIAGNOSIS — K922 Gastrointestinal hemorrhage, unspecified: Secondary | ICD-10-CM | POA: Insufficient documentation

## 2024-03-07 DIAGNOSIS — K0889 Other specified disorders of teeth and supporting structures: Secondary | ICD-10-CM

## 2024-03-07 DIAGNOSIS — I10 Essential (primary) hypertension: Secondary | ICD-10-CM | POA: Diagnosis not present

## 2024-03-07 DIAGNOSIS — D649 Anemia, unspecified: Secondary | ICD-10-CM | POA: Diagnosis not present

## 2024-03-07 DIAGNOSIS — K0381 Cracked tooth: Secondary | ICD-10-CM | POA: Insufficient documentation

## 2024-03-07 DIAGNOSIS — K029 Dental caries, unspecified: Secondary | ICD-10-CM | POA: Insufficient documentation

## 2024-03-07 DIAGNOSIS — K644 Residual hemorrhoidal skin tags: Secondary | ICD-10-CM | POA: Insufficient documentation

## 2024-03-07 DIAGNOSIS — K625 Hemorrhage of anus and rectum: Secondary | ICD-10-CM | POA: Diagnosis present

## 2024-03-07 DIAGNOSIS — R000 Tachycardia, unspecified: Secondary | ICD-10-CM | POA: Insufficient documentation

## 2024-03-07 LAB — COMPREHENSIVE METABOLIC PANEL WITH GFR
ALT: 10 U/L (ref 0–44)
AST: 13 U/L — ABNORMAL LOW (ref 15–41)
Albumin: 4.1 g/dL (ref 3.5–5.0)
Alkaline Phosphatase: 85 U/L (ref 38–126)
Anion gap: 11 (ref 5–15)
BUN: 9 mg/dL (ref 6–20)
CO2: 23 mmol/L (ref 22–32)
Calcium: 9.3 mg/dL (ref 8.9–10.3)
Chloride: 107 mmol/L (ref 98–111)
Creatinine, Ser: 0.74 mg/dL (ref 0.44–1.00)
GFR, Estimated: >60 mL/min (ref >60)
Glucose, Bld: 98 mg/dL (ref 70–99)
Potassium: 4 mmol/L (ref 3.5–5.1)
Sodium: 140 mmol/L (ref 135–145)
Total Bilirubin: 0.2 mg/dL (ref 0.0–1.2)
Total Protein: 7.7 g/dL (ref 6.5–8.1)

## 2024-03-07 LAB — CBC WITH DIFFERENTIAL/PLATELET
Abs Immature Granulocytes: 0.04 K/uL (ref 0.00–0.07)
Basophils Absolute: 0.1 K/uL (ref 0.0–0.1)
Basophils Relative: 0 %
Eosinophils Absolute: 0.1 K/uL (ref 0.0–0.5)
Eosinophils Relative: 1 %
HCT: 30.6 % — ABNORMAL LOW (ref 36.0–46.0)
Hemoglobin: 9.1 g/dL — ABNORMAL LOW (ref 12.0–15.0)
Immature Granulocytes: 0 %
Lymphocytes Relative: 25 %
Lymphs Abs: 3 K/uL (ref 0.7–4.0)
MCH: 16.5 pg — ABNORMAL LOW (ref 26.0–34.0)
MCHC: 29.7 g/dL — ABNORMAL LOW (ref 30.0–36.0)
MCV: 55.6 fL — ABNORMAL LOW (ref 80.0–100.0)
Monocytes Absolute: 0.6 K/uL (ref 0.1–1.0)
Monocytes Relative: 5 %
Neutro Abs: 8.1 K/uL — ABNORMAL HIGH (ref 1.7–7.7)
Neutrophils Relative %: 69 %
Platelets: 332 K/uL (ref 150–400)
RBC: 5.5 MIL/uL — ABNORMAL HIGH (ref 3.87–5.11)
RDW: 21.6 % — ABNORMAL HIGH (ref 11.5–15.5)
WBC: 11.8 K/uL — ABNORMAL HIGH (ref 4.0–10.5)
nRBC: 0.3 % — ABNORMAL HIGH (ref 0.0–0.2)

## 2024-03-07 LAB — URINALYSIS, ROUTINE W REFLEX MICROSCOPIC
Bacteria, UA: NONE SEEN
Bilirubin Urine: NEGATIVE
Glucose, UA: NEGATIVE mg/dL
Hgb urine dipstick: NEGATIVE
Ketones, ur: NEGATIVE mg/dL
Leukocytes,Ua: NEGATIVE
Nitrite: NEGATIVE
Protein, ur: 100 mg/dL — AB
Specific Gravity, Urine: 1.042 — ABNORMAL HIGH (ref 1.005–1.030)
pH: 7 (ref 5.0–8.0)

## 2024-03-07 LAB — LIPASE, BLOOD: Lipase: 30 U/L (ref 11–51)

## 2024-03-07 LAB — ACETAMINOPHEN LEVEL: Acetaminophen (Tylenol), Serum: <10 ug/mL — ABNORMAL LOW (ref 10–30)

## 2024-03-07 LAB — PROTIME-INR
INR: 1 (ref 0.8–1.2)
Prothrombin Time: 14 s (ref 11.4–15.2)

## 2024-03-07 LAB — OCCULT BLOOD X 1 CARD TO LAB, STOOL: Fecal Occult Bld: NEGATIVE

## 2024-03-07 LAB — PREGNANCY, URINE: Preg Test, Ur: NEGATIVE

## 2024-03-07 MED ORDER — SODIUM CHLORIDE 0.9 % IV BOLUS
1000.0000 mL | Freq: Once | INTRAVENOUS | Status: AC
  Administered 2024-03-07: 1000 mL via INTRAVENOUS

## 2024-03-07 MED ORDER — PANTOPRAZOLE SODIUM 40 MG PO TBEC
40.0000 mg | DELAYED_RELEASE_TABLET | Freq: Every day | ORAL | 0 refills | Status: DC
  Filled 2024-03-07: qty 14, 14d supply, fill #0

## 2024-03-07 MED ORDER — ONDANSETRON HCL 4 MG PO TABS
4.0000 mg | ORAL_TABLET | ORAL | 0 refills | Status: DC | PRN
  Filled 2024-03-07: qty 10, 2d supply, fill #0

## 2024-03-07 MED ORDER — ONDANSETRON HCL 4 MG/2ML IJ SOLN
4.0000 mg | Freq: Once | INTRAMUSCULAR | Status: AC
  Administered 2024-03-07: 4 mg via INTRAVENOUS
  Filled 2024-03-07: qty 2

## 2024-03-07 MED ORDER — MORPHINE SULFATE (PF) 4 MG/ML IV SOLN
4.0000 mg | Freq: Once | INTRAVENOUS | Status: AC
  Administered 2024-03-07: 4 mg via INTRAVENOUS
  Filled 2024-03-07: qty 1

## 2024-03-07 MED ORDER — IOHEXOL 350 MG/ML SOLN
100.0000 mL | Freq: Once | INTRAVENOUS | Status: AC | PRN
  Administered 2024-03-07: 100 mL via INTRAVENOUS

## 2024-03-07 MED ORDER — PANTOPRAZOLE SODIUM 40 MG IV SOLR
40.0000 mg | Freq: Once | INTRAVENOUS | Status: AC
  Administered 2024-03-07: 40 mg via INTRAVENOUS
  Filled 2024-03-07: qty 10

## 2024-03-07 MED ORDER — AMOXICILLIN-POT CLAVULANATE 875-125 MG PO TABS
1.0000 | ORAL_TABLET | Freq: Two times a day (BID) | ORAL | 0 refills | Status: DC
  Filled 2024-03-07: qty 14, 7d supply, fill #0

## 2024-03-07 MED ORDER — HYDROCODONE-ACETAMINOPHEN 5-325 MG PO TABS
1.0000 | ORAL_TABLET | ORAL | 0 refills | Status: DC | PRN
  Filled 2024-03-07: qty 15, 3d supply, fill #0

## 2024-03-07 NOTE — Discharge Instructions (Addendum)
 It was a pleasure caring for you today in the emergency department.  Please follow-up with dentist.  In the meantime please gargle with salt water 3 times daily, gargle Listerine 3 times daily.  You can take over-the-counter acetaminophen  or ibuprofen  per instructions on the box.  You can also use topical oragel per manufacturer's instructions  Please return to the emergency department for any worsening or worrisome symptoms.  Call your gastroenterologist,  Dorise Alm Lennox, MD, tomorrow and schedule an appointment as soon as possible.  Only drink clear liquids overnight and start your Protonix  as prescribed.

## 2024-03-07 NOTE — ED Provider Notes (Signed)
 Bright red GI bleed with abdominal pain.  Patient been taking ibuprofen  and Tylenol .  CT pending labs pending.  Final disposition per results Physical Exam  BP (!) 130/97 (BP Location: Right Arm)   Pulse 96   Temp (!) 97 F (36.1 C) (Temporal)   Resp 16   Wt (!) 142.4 kg   SpO2 100%   BMI 49.18 kg/m   Physical Exam  Procedures  Procedures  ED Course / MDM   Clinical Course as of 03/07/24 1728  Thu Mar 07, 2024  1418 No palpable stool in rectal vault on exam, external hemorrhoid noted that is not actively bleeding [SG]  1528 Hemoglobin(!): 9.1 Mildly reduced compared to prior [SG]    Clinical Course User Index [SG] Elnor Jayson LABOR, DO   Medical Decision Making Amount and/or Complexity of Data Reviewed Labs: ordered. Decision-making details documented in ED Course. Radiology: ordered.  Risk Prescription drug management. Decision regarding hospitalization.  Patient denies any vomiting.  She reports she was taking a lot of ibuprofen  and Tylenol  for dental pain.  Blood has been bright red she showed a picture of normal stool with a large amount of bright red semiclotted blood in the toilet.    CT bleeding scan does not show any active bleeding or other acute intra-abdominal abnormality.  White count 11.8 hemoglobin 9.1 acetaminophen  less than 10 lipase 30 normal comprehensive metabolic panel GFR greater than 60 urinalysis negative.  Hemoccult test negative at this time.  Last baseline hemoglobin was 9.9.  Consult: Reviewed with Dr. Rosalie gastroenterology.  We reviewed bleeding scan results, patient's hemoglobin, patient's history was ibuprofen  consumption abdominal pain.,  Patient's prior endoscopies December 2024.  At this time with negative bleeding scan, bleeding consistent with lower GI bleed and endoscopies without other identified significant abnormality, Dr. Rosalie felt patient was stable for discharge, obviously discontinuing all ibuprofen , clear liquids overnight and  contacting her gastroenterology group for follow-up.  Return precautions for CT bleeding and return to the emergency department.  Clinically patient is well in appearance.  Normal sinus rhythm heart rate in the 70s blood pressure 139/80.  Patient is concerned for rectal bleeding and would like admission at this time however I did discuss this with Dr. Rosalie and given above diagnostic results and history, he did not think emergent procedures would be indicated.  Recommendation is for clear liquids overnight, starting a PPI and contacting GI in the morning with careful return precautions.      Armenta Canning, MD 03/07/24 [phone]

## 2024-03-07 NOTE — ED Notes (Signed)
 Patient asking for additional pain medication, pain level going back up after pain medications. Provider notified

## 2024-03-07 NOTE — ED Triage Notes (Signed)
 Pt has an abscess tooth pain, has been taking tylenol  a lot of tylenol , can't say how much. She took a half of a small bottle of tylenol  in one day. She took a lot of ibuprofen  as well. This has been for 2 days. Now with stomach pain ,epigastric area. Bowel movement was thick dark red blood.

## 2024-03-07 NOTE — ED Notes (Signed)
 Reviewed AVS/discharge instruction with patient. Time allotted for and all questions answered. Patient is agreeable for d/c and escorted to ed exit by staff.

## 2024-03-07 NOTE — ED Provider Notes (Addendum)
 [facility] AT [facility] Provider Note  CSN: [id number] Arrival date & time: 03/07/24 1218  Chief Complaint(s) GI Bleeding  HPI Julie King is a 33 y.o. female with past medical history as below, significant for obesity, hypertension, IBS, hemorrhoids who presents to the ED with complaint of dental pain, rectal bleeding  Pt reports dental pain over the last few days, she does not f/w dentist regularly. Pain primarily to left sided of her jaw, no dysphagia or dyspnea, tolerating her own secretions. She has been taking high amounts of nsaid's and tylenol  and etoh to help treat her pain. She has been having epigastric pain which has since progressed to generalized. Having brbpr and passing some clots, hx hemorrhoids but not having any pain w/ defecation. Reports recent endoscopy/colonscopy that was o/w normal other than hemorrhoids. No thinners.   Past Medical History Past Medical History:  Diagnosis Date   Atrial fibrillation (HCC) 2020   Complication of anesthesia    wakes up emotional   Family history of adverse reaction to anesthesia    mother has n & v   Family history of breast cancer    Headache 07/25/2017   Hypertension    Irritable bowel syndrome    Morbidly obese (HCC) 07/25/2017   PONV (postoperative nausea and vomiting)    QT prolongation    T wave inversion in EKG    Tobacco abuse 10/14/2022   Patient Active Problem List   Diagnosis Date Noted   Hemorrhoids 08/01/2023   Rectal bleeding 08/01/2023   Encounter for examination following treatment at hospital 08/01/2023   Anemia 08/01/2023   External hemorrhoid, bleeding 08/01/2023   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 Authorizing Provider  amoxicillin -clavulanate (AUGMENTIN ) 875-125 MG tablet Take 1 tablet by mouth every 12 (twelve) hours. 03/07/24  Yes Elnor Jayson DELENA, DO  HYDROcodone -acetaminophen  (NORCO/VICODIN) 5-325 MG tablet Take 1 tablet by mouth every 4 (four) hours as needed. 03/07/24  Yes Elnor Jayson A, DO  ondansetron  (ZOFRAN ) 4 MG tablet Take 1 tablet (4 mg total) by mouth every 4 (four) hours as needed for nausea or vomiting. 03/07/24  Yes Elnor Jayson A, DO  pantoprazole  (PROTONIX ) 40 MG tablet Take 1 tablet (40 mg total) by mouth daily for 14 days. 03/07/24 03/21/24 Yes Elnor Jayson A, DO  acetaminophen  (TYLENOL ) 500 MG tablet Take 1,000 mg by mouth 2 (two) times daily as needed for mild pain, moderate pain, headache or fever.    [provider]  albuterol  (VENTOLIN  HFA) 108 (90 Base) MCG/ACT inhaler Inhale 2 puffs into the lungs every 6 (six) hours as needed for wheezing or shortness of breath (Cough). 06/14/22   Joesph Shaver Scales, PA-C  benzonatate  (TESSALON ) 100 MG capsule Take 1 capsule (100 mg total) by mouth every 8 (eight) hours. 01/15/24   White, Shelba SAUNDERS, NP  ferrous sulfate  325 (65 FE) MG EC tablet Take 1 tablet (325 mg total) by mouth daily. Patient not taking: Reported on 09/07/2023  08/02/23   Paseda, Folashade R, FNP  ipratropium-albuterol  (DUONEB) 0.5-2.5 (3) MG/3ML SOLN Take 3 mLs by nebulization 4 (four) times daily. Patient taking differently: Take 3 mLs by nebulization every 6 (six) hours as needed (shortness of breath). 09/01/22   Minor, Elsie RAMAN, NP  naproxen  (NAPROSYN ) 500 MG tablet Take 1 tablet (500 mg total) by mouth 2 (two) times daily with a meal. 11/09/23   Gladis Elsie BROCKS, PA-C                                                                                                                                     Past Surgical History Past Surgical History:  Procedure Laterality Date   BRONCHIAL BIOPSY  08/30/2022   Procedure: BRONCHIAL BIOPSIES;  Surgeon: Brenna Adine CROME, DO;  Location: [facility];  Service: Pulmonary;;   BRONCHIAL BRUSHINGS  08/30/2022   Procedure: BRONCHIAL BRUSHINGS;  Surgeon: Brenna Adine CROME, DO;  Location: [facility];  Service: Pulmonary;;   BRONCHIAL NEEDLE ASPIRATION BIOPSY  08/30/2022   Procedure: BRONCHIAL NEEDLE ASPIRATION BIOPSIES;  Surgeon: Brenna Adine CROME, DO;  Location: [facility];  Service: Pulmonary;;   CESAREAN SECTION     CESAREAN SECTION N/A 01/31/2020   Procedure: CESAREAN SECTION;  Surgeon: Lorence Ozell CROME, MD;  Location: [facility] ORS;  Service: Obstetrics;  Laterality: N/A;   CHOLECYSTECTOMY     DECORTICATION Left 10/21/2022   Procedure: DECORTICATION;  Surgeon: Kerrin Elspeth BROCKS, MD;  Location: [facility] OR;  Service: Thoracic;  Laterality: Left;   HERNIA REPAIR     INTERCOSTAL NERVE BLOCK Left 10/06/2022   Procedure: INTERCOSTAL NERVE BLOCK;  Surgeon: Kerrin Elspeth BROCKS, MD;  Location: [facility] OR;  Service: Thoracic;  Laterality: Left;   IR THORACENTESIS ASP PLEURAL SPACE W/IMG GUIDE  10/17/2022   PLEURAL EFFUSION DRAINAGE Left 10/21/2022   Procedure: DRAINAGE OF PLEURAL EFFUSION;  Surgeon: Kerrin Elspeth BROCKS, MD;  Location: [facility] OR;  Service: Thoracic;  Laterality: Left;   TONSILLECTOMY     VIDEO ASSISTED THORACOSCOPY Left 10/21/2022   Procedure: VIDEO ASSISTED THORACOSCOPY;  Surgeon: Kerrin Elspeth BROCKS, MD;  Location: [facility] OR;  Service: Thoracic;  Laterality: Left;   VIDEO BRONCHOSCOPY WITH ENDOBRONCHIAL ULTRASOUND N/A 10/06/2022   Procedure: VIDEO BRONCHOSCOPY WITH ENDOBRONCHIAL ULTRASOUND;  Surgeon: Kerrin Elspeth BROCKS, MD;  Location: [facility] OR;  Service: Thoracic;  Laterality: N/A;   Family History Family History  Problem Relation Age of  Onset   Crohn's disease Mother    Hypertension Mother    Thyroid  disease Father    Diabetes Father    Breast cancer Maternal Aunt 30   Breast cancer Paternal Grandmother        dx under 50   Hypertension Other    Diabetes Other    Breast cancer Other 77       MGMs sister, currently has stage IV    Social History Social History   Tobacco Use   Smoking  status: Every Day    Current packs/day: 0.50    Average packs/day: 0.5 packs/day for 10.0 years (5.0 ttl pk-yrs)    Types: Cigarettes   Smokeless tobacco: Never   Tobacco comments:    Quit smoking on 10/05/22 -- 11/10/2022 [facility]  Vaping Use   Vaping status: Some Days  Substance Use Topics   Alcohol use: Yes    Comment: rarely   Drug use: No   Allergies Percocet [oxycodone -acetaminophen ] and Pineapple  Review of Systems A thorough review of systems was obtained and all systems are negative except as noted in the HPI and PMH.   Physical Exam Vital Signs  I have reviewed the triage vital signs BP 139/73   Pulse 83   Temp 98 F (36.7 C)   Resp 16   Wt (!) 142.4 kg   SpO2 100%   BMI 49.18 kg/m  Physical Exam Vitals and nursing note reviewed. Exam conducted with a chaperone present.  Constitutional:      General: She is not in acute distress.    Appearance: Normal appearance. She is obese.  HENT:     Head: Normocephalic and atraumatic.     Right Ear: External ear normal.     Left Ear: External ear normal.     Nose: Nose normal.     Mouth/Throat:     Mouth: Mucous membranes are moist.     Pharynx: Oropharynx is clear. Uvula midline.      Comments: No evidence of Ludwig's angina, uvula is midline, no drooling stridor or trismus Eyes:     General: No scleral icterus.       Right eye: No discharge.        Left eye: No discharge.  Cardiovascular:     Rate and Rhythm: Regular rhythm. Tachycardia present.     Pulses: Normal pulses.     Heart sounds: Normal heart sounds.  Pulmonary:     Effort: Pulmonary effort is  normal. No respiratory distress.     Breath sounds: Normal breath sounds. No stridor.  Abdominal:     General: Abdomen is flat. There is no distension.     Palpations: Abdomen is soft.     Tenderness: There is generalized abdominal tenderness and tenderness in the epigastric area. There is no guarding or rebound.  Genitourinary:    Comments: External hemorrhoid non bleeding  Musculoskeletal:     Cervical back: No rigidity.     Right lower leg: No edema.     Left lower leg: No edema.  Skin:    General: Skin is warm and dry.     Capillary Refill: Capillary refill takes less than 2 seconds.  Neurological:     Mental Status: She is alert.  Psychiatric:        Mood and Affect: Mood normal.        Behavior: Behavior normal. Behavior is cooperative.     ED Results and Treatments Labs (all labs ordered are listed, but only abnormal results are displayed) Labs Reviewed  CBC WITH DIFFERENTIAL/PLATELET - Abnormal; Notable for the following components:      Result Value   WBC 11.8 (*)    RBC 5.50 (*)    Hemoglobin 9.1 (*)    HCT 30.6 (*)    MCV 55.6 (*)    MCH 16.5 (*)    MCHC 29.7 (*)    RDW 21.6 (*)    nRBC 0.3 (*)    Neutro Abs 8.1 (*)    All other  components within normal limits  COMPREHENSIVE METABOLIC PANEL WITH GFR - Abnormal; Notable for the following components:   AST 13 (*)    All other components within normal limits  URINALYSIS, ROUTINE W REFLEX MICROSCOPIC - Abnormal; Notable for the following components:   Specific Gravity, Urine 1.042 (*)    Protein, ur 100 (*)    All other components within normal limits  ACETAMINOPHEN  LEVEL - Abnormal; Notable for the following components:   Acetaminophen  (Tylenol ), Serum <10 (*)    All other components within normal limits  PROTIME-INR  PREGNANCY, URINE  LIPASE, BLOOD  OCCULT BLOOD X 1 CARD TO LAB, STOOL                                                                                                                           Radiology CT ANGIO GI BLEED Result Date: 03/07/2024 CLINICAL DATA:  Concern for GI bleed. EXAM: CTA ABDOMEN AND PELVIS WITHOUT AND WITH CONTRAST TECHNIQUE: Multidetector CT imaging of the abdomen and pelvis was performed using the standard protocol during bolus administration of intravenous contrast. Multiplanar reconstructed images and MIPs were obtained and reviewed to evaluate the vascular anatomy. RADIATION DOSE REDUCTION: This exam was performed according to the departmental dose-optimization program which includes automated exposure control, adjustment of the mA and/or kV according to patient size and/or use of iterative reconstruction technique. CONTRAST:  OMNIPAQUE  IOHEXOL  350 MG/ML SOLN COMPARISON:  None Available. FINDINGS: VASCULAR Aorta: Normal caliber aorta without aneurysm, dissection, vasculitis or significant stenosis. Celiac: Patent without evidence of aneurysm, dissection, vasculitis or significant stenosis. SMA: Patent without evidence of aneurysm, dissection, vasculitis or significant stenosis. Renals: Both renal arteries are patent without evidence of aneurysm, dissection, vasculitis, fibromuscular dysplasia or significant stenosis. IMA: Patent without evidence of aneurysm, dissection, vasculitis or significant stenosis. Inflow: Patent without evidence of aneurysm, dissection, vasculitis or significant stenosis. Proximal Outflow: The visualized proximal of fluid is patent. Veins: The IVC is unremarkable.  No portal venous gas. Review of the MIP images confirms the above findings. NON-VASCULAR Lower chest: The visualized lung bases are clear. No intra-abdominal free air or free fluid. Hepatobiliary: The liver is unremarkable. Mild biliary dilatation, post cholecystectomy. No retained calcified stone noted in the central CBD. Pancreas: Unremarkable. No pancreatic ductal dilatation or surrounding inflammatory changes. Spleen: Normal in size without focal abnormality. Adrenals/Urinary  Tract: The adrenal glands unremarkable. There is no hydronephrosis or nephrolithiasis on either side. The visualized ureters and urinary bladder appear unremarkable. Stomach/Bowel: There is no bowel obstruction or active inflammation. No evidence of active GI bleed. The appendix is normal. Lymphatic: No adenopathy. Reproductive: The uterus is grossly unremarkable no suspicious adnexal masses. Other: None Musculoskeletal: No acute or significant osseous findings. IMPRESSION: No acute intra-abdominal or pelvic pathology. No evidence of active GI bleed. Electronically Signed   By: Vanetta Chou M.D.   On: 03/07/2024 16:06    Pertinent labs & imaging results that were available during my care of the patient  were reviewed by me and considered in my medical decision making (see MDM for details).  Medications Ordered in ED Medications  sodium chloride  0.9 % bolus 1,000 mL (0 mLs Intravenous Stopped 03/07/24 1538)  pantoprazole  (PROTONIX ) injection 40 mg (40 mg Intravenous Given 03/07/24 1415)  ondansetron  (ZOFRAN ) injection 4 mg (4 mg Intravenous Given 03/07/24 1415)  morphine  (PF) 4 MG/ML injection 4 mg (4 mg Intravenous Given 03/07/24 1415)  iohexol  (OMNIPAQUE ) 350 MG/ML injection 100 mL (100 mLs Intravenous Contrast Given 03/07/24 1535)  morphine  (PF) 4 MG/ML injection 4 mg (4 mg Intravenous Given 03/07/24 1652)                                                                                                                                     Procedures Procedures  (including critical care time)  Medical Decision Making / ED Course    Medical Decision Making:    Julie King is a 33 y.o. female  with past medical history as below, significant for obesity, hypertension, IBS, hemorrhoids who presents to the ED with complaint of dental pain, rectal bleeding. The complaint involves an extensive differential diagnosis and also carries with it a high risk of complications and morbidity.  Serious  etiology was considered. Ddx includes but is not limited to: Differential diagnosis includes but is not exclusive to acute cholecystitis, intrathoracic causes for epigastric abdominal pain, gastritis, duodenitis, pancreatitis, small bowel or large bowel obstruction, abdominal aortic aneurysm, hernia, gastritis, gastrointestinal hemorrhage, hemorrhoid, diverticulosis, AVM, bleeding ulcer etc.   Complete initial physical exam performed, notably the patient was in no acute distress, tachycardic , HDS.    Reviewed and confirmed nursing documentation for past medical history, family history, social history.  Vital signs reviewed.    Abd pain Rectal bleeding> - Recent heavy NSAID and alcohol use - Bright red blood - no thinners - CTA  Dental pain> -poor dentition -airway intact, tolerating secretions, concern possible odontogenic infection    Clinical Course as of 03/08/24 0715  Thu Mar 07, 2024  1418 No palpable stool in rectal vault on exam, external hemorrhoid noted that is not actively bleeding [SG]  1528 Hemoglobin(!): 9.1 Mildly reduced compared to prior [SG]    Clinical Course User Index [SG] Elnor Jayson DELENA, DO    >> handoff dr armenta pending ct and recheck                Additional history obtained: -Additional history obtained from na -External records from outside source obtained and reviewed including: Chart review including previous notes, labs, imaging, consultation notes including  Home medications, prior labs   Lab Tests: -I ordered, reviewed, and interpreted labs.   The pertinent results include:   Labs Reviewed  CBC WITH DIFFERENTIAL/PLATELET - Abnormal; Notable for the following components:      Result Value   WBC 11.8 (*)    RBC 5.50 (*)    Hemoglobin  9.1 (*)    HCT 30.6 (*)    MCV 55.6 (*)    MCH 16.5 (*)    MCHC 29.7 (*)    RDW 21.6 (*)    nRBC 0.3 (*)    Neutro Abs 8.1 (*)    All other components within normal limits   COMPREHENSIVE METABOLIC PANEL WITH GFR - Abnormal; Notable for the following components:   AST 13 (*)    All other components within normal limits  URINALYSIS, ROUTINE W REFLEX MICROSCOPIC - Abnormal; Notable for the following components:   Specific Gravity, Urine 1.042 (*)    Protein, ur 100 (*)    All other components within normal limits  ACETAMINOPHEN  LEVEL - Abnormal; Notable for the following components:   Acetaminophen  (Tylenol ), Serum <10 (*)    All other components within normal limits  PROTIME-INR  PREGNANCY, URINE  LIPASE, BLOOD  OCCULT BLOOD X 1 CARD TO LAB, STOOL    Notable for hgb mild drop but similar to prior   EKG   EKG Interpretation Date/Time:    Ventricular Rate:    PR Interval:    QRS Duration:    QT Interval:    QTC Calculation:   R Axis:      Text Interpretation:           Imaging Studies ordered: I ordered imaging studies including cta gi bleed >> pending   Medicines ordered and prescription drug management: Meds ordered this encounter  Medications   sodium chloride  0.9 % bolus 1,000 mL   pantoprazole  (PROTONIX ) injection 40 mg   ondansetron  (ZOFRAN ) injection 4 mg   morphine  (PF) 4 MG/ML injection 4 mg   iohexol  (OMNIPAQUE ) 350 MG/ML injection 100 mL   amoxicillin -clavulanate (AUGMENTIN ) 875-125 MG tablet    Sig: Take 1 tablet by mouth every 12 (twelve) hours.    Dispense:  14 tablet    Refill:  0   HYDROcodone -acetaminophen  (NORCO/VICODIN) 5-325 MG tablet    Sig: Take 1 tablet by mouth every 4 (four) hours as needed.    Dispense:  15 tablet    Refill:  0   ondansetron  (ZOFRAN ) 4 MG tablet    Sig: Take 1 tablet (4 mg total) by mouth every 4 (four) hours as needed for nausea or vomiting.    Dispense:  10 tablet    Refill:  0   pantoprazole  (PROTONIX ) 40 MG tablet    Sig: Take 1 tablet (40 mg total) by mouth daily for 14 days.    Dispense:  14 tablet    Refill:  0   morphine  (PF) 4 MG/ML injection 4 mg    -I have reviewed the  patients home medicines and have made adjustments as needed   Consultations Obtained: na   Cardiac Monitoring: Continuous pulse oximetry interpreted by myself, 100% on ra.    Social Determinants of Health:  Diagnosis or treatment significantly limited by social determinants of health: current smoker and obesity Counseled patient for approximately 3 minutes regarding smoking cessation. Discussed risks of smoking and how they applied and affected their visit here today. Patient not ready to quit at this time, however will follow up with their primary doctor when they are.   CPT code: 00593: intermediate counseling for smoking cessation     Reevaluation: After the interventions noted above, I reevaluated the patient and found that they have improved  Co morbidities that complicate the patient evaluation  Past Medical History:  Diagnosis Date   Atrial fibrillation (HCC) 2020  Complication of anesthesia    wakes up emotional   Family history of adverse reaction to anesthesia    mother has n & v   Family history of breast cancer    Headache 07/25/2017   Hypertension    Irritable bowel syndrome    Morbidly obese (HCC) 07/25/2017   PONV (postoperative nausea and vomiting)    QT prolongation    T wave inversion in EKG    Tobacco abuse 10/14/2022      Dispostion: Disposition decision including need for hospitalization was considered, and patient disposition pending at time of sign out.    Final Clinical Impression(s) / ED Diagnoses Final diagnoses:  Pain, dental  Gastrointestinal hemorrhage, unspecified gastrointestinal hemorrhage type  Abdominal pain, unspecified abdominal location        Elnor Jayson LABOR, DO 03/08/24 0714    Elnor Jayson LABOR, DO 03/08/24 0715

## 2024-03-12 ENCOUNTER — Telehealth: Payer: Self-pay

## 2024-03-12 NOTE — Transitions of Care (Post Inpatient/ED Visit) (Signed)
   03/12/2024  Name: Julie King MRN: [id number] DOB: 1991-06-26  Today's TOC FU Call Status:   Patient's Name and Date of Birth confirmed.  Transition Care Management Follow-up Telephone Call Date of Discharge: 03/07/24 Discharge Facility: [facility] ([facility]) Type of Discharge: Emergency Department How have you been since you were released from the hospital?: Same Any questions or concerns?: No  Items Reviewed: Did you receive and understand the discharge instructions provided?: Yes Medications obtained,verified, and reconciled?: Yes (Medications Reviewed) Any new allergies since your discharge?: No Dietary orders reviewed?: NA Do you have support at home?: Yes  Medications Reviewed Today: Medications Reviewed Today     Reviewed by Starlene Charlynn BIRCH, CMA (Certified Medical Assistant) on 03/12/24 at 1114  Med List Status: <None>   Medication Order Taking? Sig Documenting Provider Last Dose Status Informant  acetaminophen  (TYLENOL ) 500 MG tablet [id number] Yes Take 1,000 mg by mouth 2 (two) times daily as needed for mild pain, moderate pain, headache or fever. [provider]  Active Self  albuterol  (VENTOLIN  HFA) 108 (90 Base) MCG/ACT inhaler [id number] Yes Inhale 2 puffs into the lungs every 6 (six) hours as needed for wheezing or shortness of breath (Cough). Joesph Shaver Scales, PA-C  Active Self, Pharmacy Records  amoxicillin -clavulanate (AUGMENTIN ) 875-125 MG tablet [id number] Yes Take 1 tablet by mouth every 12 (twelve) hours. Elnor Jayson DELENA, DO  Active   benzonatate  (TESSALON ) 100 MG capsule [id number] Yes Take 1 capsule (100 mg total) by mouth every 8 (eight) hours. Teresa Shelba SAUNDERS, NP  Active   ferrous sulfate  325 (65 FE) MG EC tablet [id number]  Take 1 tablet (325 mg total) by mouth daily.  Patient not taking: Reported on 09/07/2023   Paseda, Folashade R, FNP  Active   HYDROcodone -acetaminophen  (NORCO/VICODIN) 5-325 MG tablet [id number] Yes Take 1 tablet  by mouth every 4 (four) hours as needed. Elnor Jayson DELENA, DO  Active   ipratropium-albuterol  (DUONEB) 0.5-2.5 (3) MG/3ML SOLN [id number] Yes Take 3 mLs by nebulization 4 (four) times daily. Minor, Elsie RAMAN, NP  Active Self, Pharmacy Records  naproxen  (NAPROSYN ) 500 MG tablet [id number] Yes Take 1 tablet (500 mg total) by mouth 2 (two) times daily with a meal. Gladis Elsie BROCKS, PA-C  Active   ondansetron  (ZOFRAN ) 4 MG tablet [id number] Yes Take 1 tablet (4 mg total) by mouth every 4 (four) hours as needed for nausea or vomiting. Elnor Jayson A, DO  Active   pantoprazole  (PROTONIX ) 40 MG tablet [id number] Yes Take 1 tablet (40 mg total) by mouth daily for 14 days. Elnor Jayson DELENA, DO  Active             Home Care and Equipment/Supplies: Were Home Health Services Ordered?: NA Any new equipment or medical supplies ordered?: NA  Functional Questionnaire: Do you need assistance with bathing/showering or dressing?: No Do you need assistance with meal preparation?: No Do you need assistance with eating?: No Do you have difficulty maintaining continence: No Do you need assistance with getting out of bed/getting out of a chair/moving?: No Do you have difficulty managing or taking your medications?: No  Follow up appointments reviewed: PCP Follow-up appointment confirmed?: Yes Date of PCP follow-up appointment?: 03/26/24 Specialist Hospital Follow-up appointment confirmed?: NA Do you need transportation to your follow-up appointment?: No Do you understand care options if your condition(s) worsen?: Yes-patient verbalized understanding    SIGNATURE Kenzee Bassin, RMA

## 2024-03-15 ENCOUNTER — Other Ambulatory Visit (HOSPITAL_BASED_OUTPATIENT_CLINIC_OR_DEPARTMENT_OTHER): Payer: Self-pay

## 2024-03-18 ENCOUNTER — Other Ambulatory Visit (HOSPITAL_BASED_OUTPATIENT_CLINIC_OR_DEPARTMENT_OTHER): Payer: Self-pay

## 2024-03-26 ENCOUNTER — Inpatient Hospital Stay: Payer: Self-pay | Admitting: Nurse Practitioner

## 2024-04-03 ENCOUNTER — Telehealth: Admitting: Physician Assistant

## 2024-04-03 DIAGNOSIS — K047 Periapical abscess without sinus: Secondary | ICD-10-CM | POA: Diagnosis not present

## 2024-04-04 MED ORDER — AMOXICILLIN-POT CLAVULANATE 875-125 MG PO TABS
1.0000 | ORAL_TABLET | Freq: Two times a day (BID) | ORAL | 0 refills | Status: DC
Start: 2024-04-04 — End: 2024-07-23

## 2024-04-04 NOTE — Progress Notes (Signed)
 I have spent 5 minutes in review of e-visit questionnaire, review and updating patient chart, medical decision making and response to patient.   Laure Kidney, PA-C

## 2024-04-04 NOTE — Progress Notes (Signed)

## 2024-04-22 ENCOUNTER — Ambulatory Visit: Payer: Self-pay

## 2024-04-22 ENCOUNTER — Telehealth: Payer: Self-pay

## 2024-04-22 NOTE — Telephone Encounter (Signed)
 FYI Only or Action Required?: FYI only for provider.  Patient was last seen in primary care on 08/01/2023 by Paseda, Folashade R, FNP.  Called Nurse Triage reporting Breast Mass.  Symptoms began several weeks ago.  Interventions attempted: Nothing.  Symptoms are: unchanged.  Triage Disposition: See PCP When Office is Open (Within 3 Days)  Patient/caregiver understands and will follow disposition?: Yes       Copied from CRM #[id number]. Topic: Clinical - Red Word Triage >> Apr 22, 2024  9:31 AM Rosaria BRAVO wrote: Red Word that prompted transfer to Nurse Triage: Found lump on breast, seeking appt nothing available soon enough    ----------------------------------------------------------------------- From previous Reason for Contact - Scheduling: Patient/patient representative is calling to schedule an appointment. Refer to attachments for appointment information. Reason for Disposition  Breast lump  Answer Assessment - Initial Assessment Questions 1. SYMPTOM: What's the main symptom you're concerned about?  (e.g., lump, nipple discharge, pain, rash)     Lump x 2 2. LOCATION: Where is the lump located?     L breast 3. ONSET: When did sx  start?     2 weeks ago - initially thought hormonal, but has not gone away 4. PRIOR HISTORY: Do you have any history of prior problems with your breasts? (e.g., breast cancer, breast implant, fibrocystic breast disease)     Endorses hx of breast cancer in family - reports needs scanning q6 mos 5. CAUSE: What do you think is causing this symptom?     unknown 6. OTHER SYMPTOMS: Do you have any other symptoms? (e.g., breast pain, fever, nipple discharge, redness or rash)     Painful with palpation Denies other sx 7. PREGNANCY-BREASTFEEDING: Is there any chance you are pregnant? When was your last menstrual period? Are you breastfeeding?     Denies, LMP 2 weeks ago    Pt endorses that she has a HFU with PCP tomorrow and  inquiring if concern can be addressed at same visit. Triager called PCP CAL to confirm that pt can be seen for multiple concerns tomorrow. Jackolyn confirmed that request can be accommodated and to make notation in visit note.  Protocols used: Breast Symptoms-A-AH

## 2024-04-22 NOTE — Telephone Encounter (Signed)
 Patient called at 9:00 am today and stated her girlfriend felt a lump in her (patient's) left breast-inner x 2 weeks ago. Patient stated lump feels hard, increased in size, and she felt another lump in the same breast-small, hard, pebble-like, mobile, not able to currently find smaller lump. Patient denies any nipple inversion, breast discharge, or skin changes. Per Scheduler patient has [facility], ineligible for [facility], but does have appointment with pcp on 04/23/2024.

## 2024-04-23 ENCOUNTER — Encounter: Payer: Self-pay | Admitting: Nurse Practitioner

## 2024-04-23 ENCOUNTER — Ambulatory Visit (INDEPENDENT_AMBULATORY_CARE_PROVIDER_SITE_OTHER): Payer: Self-pay | Admitting: Nurse Practitioner

## 2024-04-23 VITALS — BP 133/58 | HR 81 | Temp 97.5°F | Wt 314.0 lb

## 2024-04-23 DIAGNOSIS — N6320 Unspecified lump in the left breast, unspecified quadrant: Secondary | ICD-10-CM | POA: Insufficient documentation

## 2024-04-23 DIAGNOSIS — D649 Anemia, unspecified: Secondary | ICD-10-CM

## 2024-04-23 DIAGNOSIS — K625 Hemorrhage of anus and rectum: Secondary | ICD-10-CM | POA: Diagnosis not present

## 2024-04-23 NOTE — Assessment & Plan Note (Signed)
 Followed by GI

## 2024-04-23 NOTE — Patient Instructions (Addendum)
 1. Mass of left breast, unspecified quadrant (Primary)  - MM 3D DIAGNOSTIC MAMMOGRAM UNILATERAL LEFT BREAST; Future - US  LIMITED ULTRASOUND INCLUDING AXILLA LEFT BREAST ; Future     It is important that you exercise regularly at least 30 minutes 5 times a week as tolerated  Think about what you will eat, plan ahead. Choose  clean, green, fresh or frozen over canned, processed or packaged foods which are more sugary, salty and fatty. 70 to 75% of food eaten should be vegetables and fruit. Three meals at set times with snacks allowed between meals, but they must be fruit or vegetables. Aim to eat over a 12 hour period , example 7 am to 7 pm, and STOP after  your last meal of the day. Drink water,generally about 64 ounces per day, no other drink is as healthy. Fruit juice is best enjoyed in a healthy way, by EATING the fruit.  Thanks for choosing [facility] we consider it a privelige to serve you.

## 2024-04-23 NOTE — Assessment & Plan Note (Signed)
 Lab Results  Component Value Date   WBC 11.8 (H) 03/07/2024   HGB 9.1 (L) 03/07/2024   HCT 30.6 (L) 03/07/2024   MCV 55.6 (L) 03/07/2024   PLT 332 03/07/2024  Has been referred to hematologist by GI Encouraged to keep the appointment

## 2024-04-23 NOTE — Progress Notes (Signed)
 Acute Office Visit  Subjective:     Patient ID: Julie King, female    DOB: Dec 19, 1990, 33 y.o.   MRN: [id number]  Chief Complaint  Patient presents with   Hospitalization Follow-up   Breast Mass    Left  first notice two weeks ago. Maternal aunt has breast cancer    HPI Discussed the use of AI scribe software for clinical note transcription with the patient, who gave verbal consent to proceed.  History of Present Illness [name] is a 33 year old female  has a past medical history of Atrial fibrillation (HCC) (2020), Complication of anesthesia, Family history of adverse reaction to anesthesia, Family history of breast cancer, Headache (07/25/2017), Hypertension, Irritable bowel syndrome, Morbidly obese (HCC) (07/25/2017), PONV (postoperative nausea and vomiting), QT prolongation, T wave inversion in EKG, and Tobacco abuse (10/14/2022).  who presents with a lump in her left breast.  Approximately two weeks ago, she discovered a lump in her left breast, initially found by her girlfriend. The lump is non-mobile and becomes uncomfortable with excessive manipulation, though it is not severely painful. She notes dimpling on the left breast when lifting her arms, described as two dots.  She has a family history of breast cancer, with her aunt and great grandmother having had the disease. Stated that she is supposed to undergo mammograms and ultrasounds every six months due to this family history, though she does not recall the date of her last screening. She describes a previous issue with discoloration on the right breast, noted during her last imaging at [facility] at [facility].  Additionally, she describes another spot in the breast that feels like a 'pointy pebble,' which is small and sharp but harder to locate, having found it only twice.  She has a history of rectal bleeding and has seen a gastroenterologist for this issue. A sigmoidoscopy was attempted  but was incomplete due to retained stool. She has been referred to a hematologist/oncologist for further evaluation.  She is not currently taking any medications.  Assessment and Plan Assessment & Plan     Review of Systems  Constitutional:  Negative for appetite change, chills, fatigue and fever.  HENT:  Negative for congestion, postnasal drip, rhinorrhea and sneezing.   Respiratory:  Negative for cough, shortness of breath and wheezing.   Cardiovascular:  Negative for chest pain, palpitations and leg swelling.  Gastrointestinal:  Positive for anal bleeding. Negative for abdominal pain, constipation, nausea and vomiting.  Genitourinary:  Negative for difficulty urinating, dysuria, flank pain and frequency.  Musculoskeletal:  Negative for arthralgias, back pain, joint swelling and myalgias.  Skin:  Negative for color change, pallor, rash and wound.  Neurological:  Negative for dizziness, facial asymmetry, weakness, numbness and headaches.  Psychiatric/Behavioral:  Negative for behavioral problems, confusion, self-injury and suicidal ideas.         Objective:    BP (!) 133/58   Pulse 81   Temp (!) 97.5 F (36.4 C)   Wt (!) 314 lb (142.4 kg)   SpO2 100%   BMI 49.18 kg/m    Physical Exam Vitals and nursing note reviewed. Exam conducted with a chaperone present.  Constitutional:      General: She is not in acute distress.    Appearance: Normal appearance. She is obese. She is not ill-appearing, toxic-appearing or diaphoretic.  HENT:     Mouth/Throat:     Mouth: Mucous membranes are moist.     Pharynx: Oropharynx is clear.  No oropharyngeal exudate or posterior oropharyngeal erythema.  Eyes:     General: No scleral icterus.       Right eye: No discharge.        Left eye: No discharge.     Extraocular Movements: Extraocular movements intact.     Conjunctiva/sclera: Conjunctivae normal.  Cardiovascular:     Rate and Rhythm: Normal rate and regular rhythm.     Pulses:  Normal pulses.     Heart sounds: Normal heart sounds. No murmur heard.    No friction rub. No gallop.  Pulmonary:     Effort: Pulmonary effort is normal. No respiratory distress.     Breath sounds: Normal breath sounds. No stridor. No wheezing, rhonchi or rales.  Chest:     Chest wall: No mass, lacerations, deformity, swelling or tenderness.  Breasts:    Tanner Score is 5.     Right: No swelling, bleeding, inverted nipple, mass, nipple discharge, skin change or tenderness.     Left: No swelling, bleeding, inverted nipple, mass, nipple discharge, skin change or tenderness.     Comments: Dense breast  tissues bilaterally  Abdominal:     General: There is no distension.     Palpations: Abdomen is soft.     Tenderness: There is no abdominal tenderness. There is no right CVA tenderness, left CVA tenderness or guarding.  Musculoskeletal:        General: No swelling, tenderness, deformity or signs of injury.     Right lower leg: No edema.     Left lower leg: No edema.  Lymphadenopathy:     Upper Body:     Right upper body: No supraclavicular, axillary or pectoral adenopathy.     Left upper body: No supraclavicular, axillary or pectoral adenopathy.  Skin:    General: Skin is warm and dry.     Capillary Refill: Capillary refill takes less than 2 seconds.     Coloration: Skin is not jaundiced or pale.     Findings: No bruising, erythema or lesion.  Neurological:     Mental Status: She is alert and oriented to person, place, and time.     Motor: No weakness.     Coordination: Coordination normal.     Gait: Gait normal.  Psychiatric:        Mood and Affect: Mood normal.        Behavior: Behavior normal.        Thought Content: Thought content normal.        Judgment: Judgment normal.     No results found for any visits on 04/23/24.      Assessment & Plan:   Problem List Items Addressed This Visit       Digestive   Rectal bleeding   Followed by GI        Other   Anemia    Lab Results  Component Value Date   WBC 11.8 (H) 03/07/2024   HGB 9.1 (L) 03/07/2024   HCT 30.6 (L) 03/07/2024   MCV 55.6 (L) 03/07/2024   PLT 332 03/07/2024  Has been referred to hematologist by GI Encouraged to keep the appointment      Mass of left breast - Primary   Has dense breat tissue , no discrete mass palpated. Family history of breast cancer. Bilateral mammogram done last year was normal with recommendation to repeat mammogram starting at age 7 - Ordered mammogram and ultrasound at [facility].  Relevant Orders   MM 3D DIAGNOSTIC MAMMOGRAM UNILATERAL LEFT BREAST   US  LIMITED ULTRASOUND INCLUDING AXILLA LEFT BREAST     No orders of the defined types were placed in this encounter.   Return in about 4 months (around 08/23/2024) for CPE.  Vista Sawatzky R Judithe Keetch, FNP

## 2024-04-23 NOTE — Assessment & Plan Note (Signed)
 Has dense breat tissue , no discrete mass palpated. Family history of breast cancer. Bilateral mammogram done last year was normal with recommendation to repeat mammogram starting at age 33 - Ordered mammogram and ultrasound at [facility] at [facility].

## 2024-04-26 ENCOUNTER — Ambulatory Visit
Admission: RE | Admit: 2024-04-26 | Discharge: 2024-04-26 | Disposition: A | Discharge status: Home / Self Care | Payer: Self-pay | Source: Ambulatory Visit | Attending: Nurse Practitioner | Admitting: Nurse Practitioner

## 2024-04-26 DIAGNOSIS — N6320 Unspecified lump in the left breast, unspecified quadrant: Secondary | ICD-10-CM | POA: Insufficient documentation

## 2024-07-22 ENCOUNTER — Other Ambulatory Visit: Payer: Self-pay

## 2024-07-22 ENCOUNTER — Emergency Department (HOSPITAL_BASED_OUTPATIENT_CLINIC_OR_DEPARTMENT_OTHER): Payer: Self-pay | Admitting: Radiology

## 2024-07-22 DIAGNOSIS — F17210 Nicotine dependence, cigarettes, uncomplicated: Secondary | ICD-10-CM | POA: Insufficient documentation

## 2024-07-22 DIAGNOSIS — M542 Cervicalgia: Secondary | ICD-10-CM | POA: Insufficient documentation

## 2024-07-22 DIAGNOSIS — M546 Pain in thoracic spine: Secondary | ICD-10-CM | POA: Insufficient documentation

## 2024-07-22 DIAGNOSIS — R0609 Other forms of dyspnea: Secondary | ICD-10-CM | POA: Insufficient documentation

## 2024-07-22 DIAGNOSIS — I10 Essential (primary) hypertension: Secondary | ICD-10-CM | POA: Insufficient documentation

## 2024-07-22 DIAGNOSIS — R002 Palpitations: Secondary | ICD-10-CM | POA: Insufficient documentation

## 2024-07-22 DIAGNOSIS — R079 Chest pain, unspecified: Secondary | ICD-10-CM | POA: Insufficient documentation

## 2024-07-22 LAB — PREGNANCY, URINE: Preg Test, Ur: NEGATIVE

## 2024-07-22 NOTE — ED Triage Notes (Signed)
 Pt POV reporting chest pain, SOB and palpitations x1 week, worse today.  Hx afib, no longer taking daily ASA.

## 2024-07-23 ENCOUNTER — Emergency Department (INDEPENDENT_AMBULATORY_CARE_PROVIDER_SITE_OTHER): Payer: Self-pay | Admitting: Physician Assistant

## 2024-07-23 ENCOUNTER — Telehealth (HOSPITAL_COMMUNITY): Payer: Self-pay | Admitting: Licensed Clinical Social Worker

## 2024-07-23 ENCOUNTER — Ambulatory Visit: Payer: Self-pay

## 2024-07-23 ENCOUNTER — Emergency Department (HOSPITAL_BASED_OUTPATIENT_CLINIC_OR_DEPARTMENT_OTHER)
Admission: EM | Admit: 2024-07-23 | Discharge: 2024-07-23 | Disposition: A | Discharge status: Home / Self Care | Payer: Self-pay | Attending: Emergency Medicine | Admitting: Emergency Medicine

## 2024-07-23 ENCOUNTER — Encounter: Payer: Self-pay | Admitting: Physician Assistant

## 2024-07-23 ENCOUNTER — Emergency Department (HOSPITAL_BASED_OUTPATIENT_CLINIC_OR_DEPARTMENT_OTHER): Payer: Self-pay

## 2024-07-23 VITALS — BP 124/74 | HR 90 | Ht 67.0 in | Wt 318.2 lb

## 2024-07-23 DIAGNOSIS — R079 Chest pain, unspecified: Secondary | ICD-10-CM

## 2024-07-23 DIAGNOSIS — R002 Palpitations: Secondary | ICD-10-CM

## 2024-07-23 DIAGNOSIS — R0789 Other chest pain: Secondary | ICD-10-CM

## 2024-07-23 DIAGNOSIS — R0602 Shortness of breath: Secondary | ICD-10-CM

## 2024-07-23 DIAGNOSIS — R0609 Other forms of dyspnea: Secondary | ICD-10-CM

## 2024-07-23 DIAGNOSIS — R0683 Snoring: Secondary | ICD-10-CM

## 2024-07-23 DIAGNOSIS — I480 Paroxysmal atrial fibrillation: Secondary | ICD-10-CM

## 2024-07-23 LAB — BASIC METABOLIC PANEL WITH GFR
Anion gap: 11 (ref 5–15)
BUN: 11 mg/dL (ref 6–20)
CO2: 25 mmol/L (ref 22–32)
Calcium: 9.5 mg/dL (ref 8.9–10.3)
Chloride: 106 mmol/L (ref 98–111)
Creatinine, Ser: 0.68 mg/dL (ref 0.44–1.00)
GFR, Estimated: >60 mL/min (ref >60)
Glucose, Bld: 96 mg/dL (ref 70–99)
Potassium: 4.1 mmol/L (ref 3.5–5.1)
Sodium: 142 mmol/L (ref 135–145)

## 2024-07-23 LAB — CBC
HCT: 30.8 % — ABNORMAL LOW (ref 36.0–46.0)
Hemoglobin: 8.7 g/dL — ABNORMAL LOW (ref 12.0–15.0)
MCH: 15.5 pg — ABNORMAL LOW (ref 26.0–34.0)
MCHC: 28.2 g/dL — ABNORMAL LOW (ref 30.0–36.0)
MCV: 54.9 fL — ABNORMAL LOW (ref 80.0–100.0)
Platelets: 353 K/uL (ref 150–400)
RBC: 5.61 MIL/uL — ABNORMAL HIGH (ref 3.87–5.11)
RDW: 22.3 % — ABNORMAL HIGH (ref 11.5–15.5)
WBC: 13.6 K/uL — ABNORMAL HIGH (ref 4.0–10.5)
nRBC: 0.2 % (ref 0.0–0.2)

## 2024-07-23 LAB — TROPONIN T, HIGH SENSITIVITY
Troponin T High Sensitivity: <15 ng/L (ref 0–19)
Troponin T High Sensitivity: <15 ng/L (ref 0–19)

## 2024-07-23 MED ORDER — IOHEXOL 350 MG/ML SOLN
100.0000 mL | Freq: Once | INTRAVENOUS | Status: AC | PRN
  Administered 2024-07-23: 100 mL via INTRAVENOUS

## 2024-07-23 MED ORDER — KETOROLAC TROMETHAMINE 15 MG/ML IJ SOLN
15.0000 mg | Freq: Once | INTRAMUSCULAR | Status: AC
  Administered 2024-07-23: 15 mg via INTRAVENOUS
  Filled 2024-07-23: qty 1

## 2024-07-23 NOTE — ED Notes (Signed)
 Patient transported to CT

## 2024-07-23 NOTE — Progress Notes (Unsigned)
 Cardiology Office Note   Date:  07/25/2024  ID:  PENNI PENADO, DOB 1991-06-10, MRN [id number] PCP: Paseda, Folashade R, FNP  [facility] Providers Cardiologist:  [facility] - Dr. Santo   History of Present Illness Julie King is a 33 y.o. female with past medical history of atrial fibrillation in 2020, family history of breast cancer, hypertension, irritable bowel syndrome, morbid obesity and tobacco abuse.  Previous echocardiogram obtained on 05/02/2017 showed EF 55 to 60%, normal wall motion, no significant valve issue.  Echocardiogram obtained at [facility] on 12/05/2019 showed EF 55 to 60%, trace MR.  30-day heart monitor was also obtained at that time, however I am unable to see the results.  She presented at [facility] in October 2023 with hemoptysis and shortness of breath.  CT showed a left lung mass.  PET CT showed mass was hypermetabolic with lymph node in the hilum.  She underwent bronchoscopy twice which showed only inflammation.  She continued to have hemoptysis.  CT finding were suspicious for malignancy.  She ultimately underwent robotic assisted wedge resection on 10/06/2022.  Nodule turned out to be abscess and grew strep intermedius.  She later presented back to the ED several weeks after the procedure for increasing pleuritic chest pain and found to have pneumonia and a parapneumonic effusion/empyema.  She required VATS for drainage and decortication on 10/21/2022.  Culture of the fluid came back negative.  She was last seen by Dr. Kerrin in March 2024.  She had normal EGD in December 2024.  Colonoscopy only showed internal hemorrhoid.  She was evaluated in July 2025 by [facility] service for microcytic anemia.  Proctoscopy procedure performed on 03/13/2024 was normal.  Patient was seen in the emergency room early this morning was chest pain and neck pain and right-sided thoracic back pain worse with deep inspiration.  White blood cell count was elevated at 13.6,  hemoglobin 8.7.  Hematocrit 30.8, RDW elevated at 22.3.  Urine pregnancy test negative.  Troponin negative x 2.  Chest x-ray showed chronic postoperative changes of the left base but no acute finding.  Patient was treated with Toradol .  CTA of the chest showed no PE.  Patient was subsequently referred to cardiology service for further evaluation.  Patient presents today for her first clinic evaluation of chest pain, palpitation and enlarged heart seen on the CTA of the chest this morning.  Chest pain appears to be chronic for at least several months.  She gets chest pain with exertion but chest pain also has a clear pleuritic feature and worse with deep inspiration.  She complains of worsening palpitation in the past several weeks.  She does drink soda however has switched to juice recently.  I recommended a heart monitor.  Given the enlarged heart on the CT of the chest, I will also order echocardiogram.  She does complain of snoring, I believe she would benefit from a sleep study in the future after echocardiogram come back.  Otherwise, she can establish with Dr. Arnetha in 2 to 3 months.    ROS:   Patient complains no chest pain, palpitation and snoring.  She has no lower extremity edema, orthopnea or PND.  Studies Reviewed      Cardiac Studies & Procedures   ______________________________________________________________________________________________     ECHOCARDIOGRAM  ECHOCARDIOGRAM COMPLETE 05/02/2017  Narrative *[place]* *[facility]* [address] [place], [place] [address] [phone]  ------------------------------------------------------------------- Transthoracic Echocardiography  Patient:    Indea, Dearman MR #:  [id number] Study Date: 05/02/2017 Gender:     F Age:        25 Height:     170.2 cm Weight:     133.7 kg BSA:        2.59 m^2 Pt. Status: Room:  ATTENDING    Dow Arland JAYSON TISA Dow Arland JAYSON REFERRING     Dow Arland C SONOGRAPHER  Vernell Saba PERFORMING   [facility], Outpatient  cc:  ------------------------------------------------------------------- LV EF: 55% -   60%  ------------------------------------------------------------------- Indications:      Atrial fibrillation - 427.31.  ------------------------------------------------------------------- Study Conclusions  - Left ventricle: The cavity size was normal. Wall thickness was normal. Systolic function was normal. The estimated ejection fraction was in the range of 55% to 60%. Wall motion was normal; there were no regional wall motion abnormalities. Left ventricular diastolic function parameters were normal.  Impressions:  - Normal LV systolic and diastolic function.  ------------------------------------------------------------------- Study data:  No prior study was available for comparison.  Study status:  Routine.  Procedure:  The patient reported no pain pre or post test. Transthoracic echocardiography. Image quality was adequate.  Study completion:  There were no complications. Transthoracic echocardiography.  M-mode, complete 2D, spectral Doppler, and color Doppler.  Birthdate:  Patient birthdate: [birth year].  Age:  Patient is 33 yr old.  Sex:  Gender: female. BMI: 46.2 kg/m^2.  Blood pressure:     124/70  Patient status: Inpatient.  Study date:  Study date: 05/02/2017. Study time: 11:31 AM.  -------------------------------------------------------------------  ------------------------------------------------------------------- Left ventricle:  The cavity size was normal. Wall thickness was normal. Systolic function was normal. The estimated ejection fraction was in the range of 55% to 60%. Wall motion was normal; there were no regional wall motion abnormalities. Left ventricular diastolic function parameters were normal.  ------------------------------------------------------------------- Aortic valve:    Trileaflet; normal thickness leaflets. Mobility was not restricted.  Doppler:  Transvalvular velocity was within the normal range. There was no stenosis. There was no regurgitation.  ------------------------------------------------------------------- Aorta:  Aortic root: The aortic root was normal in size.  ------------------------------------------------------------------- Mitral valve:   Structurally normal valve.   Mobility was not restricted.  Doppler:  Transvalvular velocity was within the normal range. There was no evidence for stenosis. There was no regurgitation.    Peak gradient (D): 3 mm Hg.  ------------------------------------------------------------------- Left atrium:  The atrium was normal in size.  ------------------------------------------------------------------- Right ventricle:  The cavity size was normal. Systolic function was normal.  ------------------------------------------------------------------- Pulmonic valve:    Doppler:  Transvalvular velocity was within the normal range. There was no evidence for stenosis.  ------------------------------------------------------------------- Tricuspid valve:   Structurally normal valve.    Doppler: Transvalvular velocity was within the normal range. There was trivial regurgitation.  ------------------------------------------------------------------- Right atrium:  The atrium was normal in size.  ------------------------------------------------------------------- Pericardium:  There was no pericardial effusion.  ------------------------------------------------------------------- Systemic veins: Inferior vena cava: The vessel was normal in size.  ------------------------------------------------------------------- Measurements  Left ventricle                            Value        Reference LV ID, ED, PLAX chordal                   48.4  mm     43 - 52 LV ID, ES, PLAX chordal  34.6  mm     23 -  38 LV fx shortening, PLAX chordal            29    %      >=29 LV PW thickness, ED                       9.74  mm     ---------- IVS/LV PW ratio, ED                       0.98         <=1.3 Stroke volume, 2D                         66    ml     ---------- Stroke volume/bsa, 2D                     26    ml/m^2 ---------- LV e&', lateral                            10.8  cm/s   ---------- LV E/e&', lateral                          8.04         ---------- LV e&', medial                             8.81  cm/s   ---------- LV E/e&', medial                           9.85         ---------- LV e&', average                            9.81  cm/s   ---------- LV E/e&', average                          8.85         ----------  Ventricular septum                        Value        Reference IVS thickness, ED                         9.55  mm     ----------  LVOT                                      Value        Reference LVOT ID, S                                20    mm     ---------- LVOT area                                 3.14  cm^2   ----------  LVOT peak velocity, S                     107   cm/s   ---------- LVOT mean velocity, S                     69.7  cm/s   ---------- LVOT VTI, S                               21.1  cm     ---------- LVOT peak gradient, S                     5     mm Hg  ----------  Aorta                                     Value        Reference Aortic root ID, ED                        25    mm     ----------  Left atrium                               Value        Reference LA ID, A-P, ES                            38    mm     ---------- LA ID/bsa, A-P                            1.47  cm/m^2 <=2.2 LA volume, S                              51.4  ml     ---------- LA volume/bsa, S                          19.9  ml/m^2 ---------- LA volume, ES, 1-p A4C                    58.7  ml     ---------- LA volume/bsa, ES, 1-p A4C                22.7  ml/m^2 ---------- LA  volume, ES, 1-p A2C                    45.5  ml     ---------- LA volume/bsa, ES, 1-p A2C                17.6  ml/m^2 ----------  Mitral valve                              Value        Reference Mitral E-wave peak velocity               86.8  cm/s   ---------- Mitral A-wave peak velocity  51.4  cm/s   ---------- Mitral deceleration time        (H)       285   ms     150 - 230 Mitral peak gradient, D                   3     mm Hg  ---------- Mitral E/A ratio, peak                    1.7          ----------  Right atrium                              Value        Reference RA ID, S-I, ES, A4C                       46.4  mm     34 - 49 RA area, ES, A4C                          19.4  cm^2   8.3 - 19.5 RA volume, ES, A/L                        61    ml     ---------- RA volume/bsa, ES, A/L                    23.6  ml/m^2 ----------  Right ventricle                           Value        Reference RV ID, minor axis, ED, A4C base           32.7  mm     ---------- TAPSE                                     21.4  mm     ---------- RV s&', lateral, S                         13.1  cm/s   ----------  Legend: (L)  and  (H)  mark values outside specified reference range.  ------------------------------------------------------------------- Prepared and Electronically Authenticated by  Redell Shallow 2018-09-04T12:57:52    MONITORS  CARDIAC EVENT MONITOR 05/31/2017  Narrative Sinus rhythm No atrial fibrillation No sustained arrhythmias Manual symptomatic transmissions appear to correspond to sinus rhythm or sinus tachycardia       ______________________________________________________________________________________________      Risk Assessment/Calculations  CHA2DS2-VASc Score = 2   This indicates a 2.2% annual risk of stroke. The patient's score is based upon: CHF History: 0 HTN History: 1 Diabetes History: 0 Stroke History: 0 Vascular Disease History: 0 Age Score:  0 Gender Score: 1            Physical Exam VS:  BP 124/74   Pulse 90   Ht 5' 7 (1.702 m)   Wt (!) 318 lb 3.2 oz (144.3 kg)   LMP 06/25/2024 (Exact Date)   SpO2 99%   BMI 49.84 kg/m        Wt Readings from Last 3 Encounters:  07/23/24 (!) 318 lb 3.2 oz (144.3 kg)  07/22/24 (!) 312 lb (141.5 kg)  04/23/24 (!) 314 lb (142.4 kg)    GEN: Well nourished, well developed in no acute distress NECK: No JVD; No carotid bruits CARDIAC: RRR, no murmurs, rubs, gallops RESPIRATORY:  Clear to auscultation without rales, wheezing or rhonchi  ABDOMEN: Soft, non-tender, non-distended EXTREMITIES:  No edema; No deformity   ASSESSMENT AND PLAN  Shortness of breath: CTA manage obtained last night in the emergency room showed enlarged heart.  I will obtain echocardiogram to further assess  Palpitation: Obtain heart monitor  Atypical chest pain: Symptom has been intermittent for several months and is more pronounced with deep inspiration.  Pending echocardiogram.  Given her morbid obesity, she is not a good candidate for Myoview.  Her symptom is atypical, I do not think she need a urgent cardiac catheterization.  Will continue observation for now unless symptom changes  Snoring: She likely has obstructive sleep apnea.  Will need to consider a sleep study on the next visit.  She is self-pay, therefore cost may be a concern.  PAF: She reportedly had a prior history of atrial fibrillation in 2020, I am unable to find this record.  She has been holding sinus rhythm ever since.  Her CHA2DS2-Vasc score is 2 points given female and hypertension, however despite documented history of hypertension, she is not on any blood pressure medication.  I think her CHA2DS2-Vasc score should really be just 1 point.  I do not think she need anticoagulation at this time unless A-fib recurs.       Dispo: Follow-up with Dr. Arnetha in 2 to 3 months  Signed, Janavia Rottman, [place]

## 2024-07-23 NOTE — Telephone Encounter (Signed)
 H&V Care Navigation CSW Progress Note  Clinical Social Worker consulted to speak with pt regarding lack of insurance.  Pt reports she lost her job about 4 months ago and had insurance through her employer so has lost that as well.  Has no source of income and no assets so should qualify for Medicaid and has had Medicaid in the past.  Provided her with [facility] worker information and encouraged her to go to meet with them prior to her appointment today.  Also discussed other assistance if she is not eligible for Medicaid.  Emailed copy of [employer] and [employer] application to help with medication cost.  Patient states she has not been taking medications due to cost- encouraged her to discuss with provider today and work with patient advocates on finding most affordable options for medications.  SDOH Screenings   Food Insecurity: No Food Insecurity (09/07/2023)  Housing: Low Risk  (08/02/2023)   Received from [facility]  Transportation Needs: No Transportation Needs (09/07/2023)  Utilities: Low Risk  (08/02/2023)   Received from [facility]  Depression (PHQ2-9): Low Risk  (04/23/2024)  Financial Resource Strain: High Risk (07/23/2024)  Physical Activity: Inactive (11/09/2017)  Social Connections: Unknown (01/06/2022)   Received from [facility]  Stress: Stress Concern Present (07/05/2022)   Received from [facility]  Tobacco Use: High Risk (04/23/2024)    Julie HILARIO Leech, LCSW Clinical Social Worker [facility] Desk#: [phone] Cell#: [phone]

## 2024-07-23 NOTE — Progress Notes (Unsigned)
 Enrolled patient for a 14 day Zio XT monitor to be mailed to patients home  Julie King to read

## 2024-07-23 NOTE — ED Provider Notes (Signed)
 [facility] Emergency Department Provider Note MRN:  [id number]  Arrival date & time: 07/23/24     Chief Complaint   Chest Pain   History of Present Illness   Julie King is a 33 y.o. year-old female with a history of A-fib presenting to the ED with chief complaint of chest pain.  Chest pain and neck pain and right sided thoracic back pain that is worse with deep breaths.  Also feeling intermittent palpitations.  Review of Systems  A thorough review of systems was obtained and all systems are negative except as noted in the HPI and PMH.   Patient's Health History    Past Medical History:  Diagnosis Date   Atrial fibrillation (HCC) 2020   Complication of anesthesia    wakes up emotional   Family history of adverse reaction to anesthesia    mother has n & v   Family history of breast cancer    Headache 07/25/2017   Hypertension    Irritable bowel syndrome    Morbidly obese (HCC) 07/25/2017   PONV (postoperative nausea and vomiting)    QT prolongation    T wave inversion in EKG    Tobacco abuse 10/14/2022    Past Surgical History:  Procedure Laterality Date   BRONCHIAL BIOPSY  08/30/2022   Procedure: BRONCHIAL BIOPSIES;  Surgeon: Brenna Adine CROME, DO;  Location: [facility] ENDOSCOPY;  Service: Pulmonary;;   BRONCHIAL BRUSHINGS  08/30/2022   Procedure: BRONCHIAL BRUSHINGS;  Surgeon: Brenna Adine CROME, DO;  Location: [facility] ENDOS[facility];  Service: Pulmonary;;   BRONCHIAL NEEDLE ASPIRATION BIOPSY  08/30/2022   Procedure: BRONCHIAL NEEDLE ASPIRATION BIOPSIES;  Surgeon: Brenna Adine CROME, DO;  Location: [facility] ENDOSCOPY;  Service: Pulmonary;;   CESAREAN SECTION     CESAREAN SECTION N/A 01/31/2020   Procedure: CESAREAN SECTION;  Surgeon: Lorence Ozell CROME, MD;  Location: [facility] LD ORS;  Service: Obstetrics;  Laterality: N/A;   CHOLECYSTECTOMY     DECORTICATION Left 10/21/2022   Procedure: DECORTICATION;  Surgeon: Kerrin Elspeth BROCKS, MD;  Location: [facility] OR;  Service: Thoracic;   Laterality: Left;   HERNIA REPAIR     INTERCOSTAL NERVE BLOCK Left 10/06/2022   Procedure: INTERCOSTAL NERVE BLOCK;  Surgeon: Kerrin Elspeth BROCKS, MD;  Location: [facility] OR;  Service: Thoracic;  Laterality: Left;   IR THORACENTESIS ASP PLEURAL SPACE W/IMG GUIDE  10/17/2022   PLEURAL EFFUSION DRAINAGE Left 10/21/2022   Procedure: DRAINAGE OF PLEURAL EFFUSION;  Surgeon: Kerrin Elspeth BROCKS, MD;  Location: [facility] OR;  Service: Thoracic;  Laterality: Left;   TONSILLECTOMY     VIDEO ASSISTED THORACOSCOPY Left 10/21/2022   Procedure: VIDEO ASSISTED THORACOSCOPY;  Surgeon: Kerrin Elspeth BROCKS, MD;  Location: [facility] OR;  Service: Thoracic;  Laterality: Left;   VIDEO BRONCHOSCOPY WITH ENDOBRONCHIAL ULTRASOUND N/A 10/06/2022   Procedure: VIDEO BRONCHOSCOPY WITH ENDOBRONCHIAL ULTRASOUND;  Surgeon: Kerrin Elspeth BROCKS, MD;  Location: [facility] OR;  Service: Thoracic;  Laterality: N/A;    Family History  Problem Relation Age of Onset   Crohn's disease Mother    Hypertension Mother    Thyroid  disease Father    Diabetes Father    Breast cancer Maternal Aunt 30   Breast cancer Paternal Grandmother        dx under 50   Hypertension Other    Diabetes Other    Breast cancer Other 61       MGMs sister, currently has stage IV    Social History   Socioeconomic History   Marital status:  Single    Spouse name: Not on file   Number of children: 2   Years of education: Not on file   Highest education level: High school graduate  Occupational History   Occupation: [employer]  Tobacco Use   Smoking status: Every Day    Current packs/day: 0.50    Average packs/day: 0.5 packs/day for 10.0 years (5.0 ttl pk-yrs)    Types: Cigarettes   Smokeless tobacco: Never   Tobacco comments:    Quit smoking on 10/05/22 -- 11/10/2022 [facility]  Vaping Use   Vaping status: Some Days  Substance and Sexual Activity   Alcohol use: Yes    Comment: rarely   Drug use: No   Sexual activity: Yes    Comment: with female  Other  Topics Concern   Not on file  Social History Narrative   Lives with partner   Caffeine  use: Coffee rarely   Soda sometimes   Right handed    Has one daughter   Social Drivers of Corporate Investment Banker Strain: Not on file  Food Insecurity: No Food Insecurity (09/07/2023)   Hunger Vital Sign    Worried About Running Out of Food in the Last Year: Never true    Ran Out of Food in the Last Year: Never true  Transportation Needs: No Transportation Needs (09/07/2023)   PRAPARE - Administrator, Civil Service (Medical): No    Lack of Transportation (Non-Medical): No  Physical Activity: Inactive (11/09/2017)   Exercise Vital Sign    Days of Exercise per Week: 0 days    Minutes of Exercise per Session: 0 min  Stress: Stress Concern Present (07/05/2022)   Received from [facility] - Occupational Stress Questionnaire    Feeling of Stress : Very much  Social Connections: Unknown (01/06/2022)   Received from [facility]   Social Network    Social Network: Not on file  Intimate Partner Violence: Low Risk  (09/21/2022)   Received from [facility] visits prior to 10/29/2022.   Safety    How often does anyone, including family and friends, physically hurt you?: Never    How often does anyone, including family and friends, insult or talk down to you?: Never    How often does anyone, including family and friends, threaten you with harm?: Never    How often does anyone, including family and friends, scream or curse at you?: Never     Physical Exam   Vitals:   07/23/24 0200 07/23/24 0215  BP: 127/63 (!) 124/55  Pulse: 91 79  Resp: 17 11  Temp:    SpO2: 100% 100%    CONSTITUTIONAL: Well-appearing, NAD NEURO/PSYCH:  Alert and oriented x 3, no focal deficits EYES:  eyes equal and reactive ENT/NECK:  no LAD, no JVD CARDIO: Regular rate, well-perfused, normal S1 and S2 PULM:  CTAB no wheezing or rhonchi GI/GU:   non-distended, non-tender MSK/SPINE:  No gross deformities, no edema SKIN:  no rash, atraumatic   *Additional and/or pertinent findings included in MDM below  Diagnostic and Interventional Summary    EKG Interpretation Date/Time:  Monday July 22 2024 22:45:37 EST Ventricular Rate:  90 PR Interval:  127 QRS Duration:  104 QT Interval:  355 QTC Calculation: 435 R Axis:   50  Text Interpretation: Sinus rhythm Borderline repolarization abnormality No significant change since last tracing Confirmed by Randol Simmonds [phone]) on 07/22/2024 10:54:28 PM  Labs Reviewed  CBC - Abnormal; Notable for the following components:      Result Value   WBC 13.6 (*)    RBC 5.61 (*)    Hemoglobin 8.7 (*)    HCT 30.8 (*)    MCV 54.9 (*)    MCH 15.5 (*)    MCHC 28.2 (*)    RDW 22.3 (*)    All other components within normal limits  BASIC METABOLIC PANEL WITH GFR  PREGNANCY, URINE  TROPONIN T, HIGH SENSITIVITY  TROPONIN T, HIGH SENSITIVITY    CT Angio Chest Pulmonary Embolism (PE) W or WO Contrast  Final Result    DG Chest 2 View  Final Result      Medications  ketorolac  (TORADOL ) 15 MG/ML injection 15 mg (15 mg Intravenous Given 07/23/24 0237)  iohexol  (OMNIPAQUE ) 350 MG/ML injection 100 mL (100 mLs Intravenous Contrast Given 07/23/24 0305)     Procedures  /  Critical Care Procedures  ED Course and Medical Decision Making  Initial Impression and Ddx Suspicion for PE given patient's description of symptoms, has a history of lung mass, pleural effusion as well.  Awaiting CT.  Past medical/surgical history that increases complexity of ED encounter: History of lung mass, history of pleural effusion  Interpretation of Diagnostics I personally reviewed the EKG and my interpretation is as follows: Sinus rhythm without concerning ischemic findings  No significant blood count or electrolyte disturbance.  Troponin negative.  Patient Reassessment and Ultimate  Disposition/Management     CTA PE study is normal.  Patient resting comfortably, appropriate for discharge.  Patient management required discussion with the following services or consulting groups:  None  Complexity of Problems Addressed Acute illness or injury that poses threat of life of bodily function  Additional Data Reviewed and Analyzed Further history obtained from: Prior labs/imaging results  Additional Factors Impacting ED Encounter Risk Consideration of hospitalization  Ozell HERO. Theadore, MD [facility] Emergency Medicine [facility] [email or web address]  Final Clinical Impressions(s) / ED Diagnoses     ICD-10-CM   1. Palpitations  R00.2 Ambulatory referral to Cardiology    2. Dyspnea on exertion  R06.09 Ambulatory referral to Cardiology    3. Chest pain, unspecified type  R07.9 Ambulatory referral to Cardiology      ED Discharge Orders          Ordered    Ambulatory referral to Cardiology        07/23/24 0342             Discharge Instructions Discussed with and Provided to Patient:     Discharge Instructions      You were evaluated in the Emergency Department and after careful evaluation, we did not find any emergent condition requiring admission or further testing in the hospital.  Your exam/testing today is overall reassuring.  Recommend follow-up with cardiology to discuss her symptoms.  Please return to the Emergency Department if you experience any worsening of your condition.   Thank you for allowing us  to be a part of your care.       Theadore Ozell HERO, MD 07/23/24 [phone]

## 2024-07-23 NOTE — Patient Instructions (Addendum)
 Medication Instructions:  Your physician recommends that you continue on your current medications as directed. Please refer to the Current Medication list given to you today.  *If you need a refill on your cardiac medications before your next appointment, please call your pharmacy*  Lab Work: None ordered  If you have labs (blood work) drawn today and your tests are completely normal, you will receive your results only by: MyChart Message (if you have MyChart) OR A paper copy in the mail If you have any lab test that is abnormal or we need to change your treatment, we will call you to review the results.  Testing/Procedures: Your physician has requested that you have an echocardiogram. Echocardiography is a painless test that uses sound waves to create images of your heart. It provides your doctor with information about the size and shape of your heart and how well your heart's chambers and valves are working. This procedure takes approximately one hour. There are no restrictions for this procedure. Please do NOT wear cologne, perfume, aftershave, or lotions (deodorant is allowed). Please arrive 15 minutes prior to your appointment time.  Please note: We ask at that you not bring children with you during ultrasound (echo/ vascular) testing. Due to room size and safety concerns, children are not allowed in the ultrasound rooms during exams. Our front office staff cannot provide observation of children in our lobby area while testing is being conducted. An adult accompanying a patient to their appointment will only be allowed in the ultrasound room at the discretion of the ultrasound technician under special circumstances. We apologize for any inconvenience.   ZIO XT- Long Term Monitor Instructions  Your physician has requested you wear a ZIO patch monitor for 14 days.  This is a single patch monitor. [employer] supplies one patch monitor per enrollment. Additional stickers are not available.  Please do not apply patch if you will be having a Nuclear Stress Test,  Echocardiogram, Cardiac CT, MRI, or Chest Xray during the period you would be wearing the  monitor. The patch cannot be worn during these tests. You cannot remove and re-apply the  ZIO XT patch monitor.  Your ZIO patch monitor will be mailed 3 day USPS to your address on file. It may take 3-5 days  to receive your monitor after you have been enrolled.  Once you have received your monitor, please review the enclosed instructions. Your monitor  has already been registered assigning a specific monitor serial # to you.  Billing and Patient Assistance Program Information  We have supplied [employer] with any of your insurance information on file for billing purposes. [employer] offers a sliding scale Patient Assistance Program for patients that do not have  insurance, or whose insurance does not completely cover the cost of the ZIO monitor.  You must apply for the Patient Assistance Program to qualify for this discounted rate.  To apply, please call [employer] at [phone], select option 4, select option 2, ask to apply for  Patient Assistance Program. Meredeth will ask your household income, and how many people  are in your household. They will quote your out-of-pocket cost based on that information.  [employer] will also be able to set up a 60-month, interest-free payment plan if needed.  Applying the monitor   Shave hair from upper left chest.  Hold abrader disc by orange tab. Rub abrader in 40 strokes over the upper left chest as  indicated in your monitor instructions.  Clean area with 4 enclosed alcohol  pads. Let dry.  Apply patch as indicated in monitor instructions. Patch will be placed under collarbone on left  side of chest with arrow pointing upward.  Rub patch adhesive wings for 2 minutes. Remove white label marked 1. Remove the white  label marked 2. Rub patch adhesive wings for 2 additional minutes.  While  looking in a mirror, press and release button in center of patch. A small green light will  flash 3-4 times. This will be your only indicator that the monitor has been turned on.  Do not shower for the first 24 hours. You may shower after the first 24 hours.  Press the button if you feel a symptom. You will hear a small click. Record Date, Time and  Symptom in the Patient Logbook.  When you are ready to remove the patch, follow instructions on the last 2 pages of Patient  Logbook. Stick patch monitor onto the last page of Patient Logbook.  Place Patient Logbook in the blue and white box. Use locking tab on box and tape box closed  securely. The blue and white box has prepaid postage on it. Please place it in the mailbox as  soon as possible. Your physician should have your test results approximately 7 days after the  monitor has been mailed back to [facility].  Call [facility] Customer Care at [phone] if you have questions regarding  your ZIO XT patch monitor. Call them immediately if you see an orange light blinking on your  monitor.  If your monitor falls off in less than 4 days, contact our Monitor department at [phone].  If your monitor becomes loose or falls off after 4 days call [employer] at [phone] for  suggestions on securing your monitor   Follow-Up: At [facility], you and your health needs are our priority.  As part of our continuing mission to provide you with exceptional heart care, our providers are all part of one team.  This team includes your primary Cardiologist (physician) and Advanced Practice Providers or APPs (Physician Assistants and Nurse Practitioners) who all work together to provide you with the care you need, when you need it.  Your next appointment:   2-3 month(s)  USE A HEART FIRST FOLLOW UP SLOT   Provider:   Stanly DELENA Leavens, MD    We recommend signing up for the patient portal called MyChart.  Sign up information  is provided on this After Visit Summary.  MyChart is used to connect with patients for Virtual Visits (Telemedicine).  Patients are able to view lab/test results, encounter notes, upcoming appointments, etc.  Non-urgent messages can be sent to your provider as well.   To learn more about what you can do with MyChart, go to [email or web address].   Other Instructions

## 2024-07-23 NOTE — Discharge Instructions (Addendum)
 You were evaluated in the Emergency Department and after careful evaluation, we did not find any emergent condition requiring admission or further testing in the hospital.  Your exam/testing today is overall reassuring.  Recommend follow-up with cardiology to discuss her symptoms.  Please return to the Emergency Department if you experience any worsening of your condition.   Thank you for allowing [place] to be a part of your care.

## 2024-08-09 ENCOUNTER — Telehealth (HOSPITAL_COMMUNITY): Payer: Self-pay | Admitting: Licensed Clinical Social Worker

## 2024-08-09 NOTE — Telephone Encounter (Signed)
 H&V Care Navigation CSW Progress Note  Clinical Social Worker called pt to check in to see if she applied for medicaid or had any questions about [place] [employer] and [employer] apps that were sent- unable to reach- left VM requesting return call if in need of assistance  Andriette HILARIO Leech, LCSW Clinical Social Worker [facility] Desk#: [phone] Cell#: [phone]

## 2024-08-30 ENCOUNTER — Ambulatory Visit (HOSPITAL_COMMUNITY): Admission: RE | Admit: 2024-08-30 | Payer: Self-pay | Source: Ambulatory Visit

## 2024-09-04 ENCOUNTER — Telehealth (HOSPITAL_COMMUNITY): Payer: Self-pay | Admitting: Licensed Clinical Social Worker

## 2024-09-04 NOTE — Telephone Encounter (Signed)
 CSW attempted to call pt to follow regarding Medicaid/[place] [employer]FA- unable to reach- left VM requesting return call  Andriette HILARIO Leech, LCSW Clinical Social Worker [facility] Desk#: [phone] Cell#: [phone]

## 2024-09-23 ENCOUNTER — Ambulatory Visit: Payer: Self-pay

## 2024-09-23 NOTE — Telephone Encounter (Signed)
 FYI Only or Action Required?: FYI only for provider: ED advised.  Patient was last seen in primary care on 04/23/2024 by Paseda, Folashade R, FNP.  Called Nurse Triage reporting Rectal Bleeding.  Symptoms began several days ago.  Interventions attempted: Nothing.  Symptoms are: gradually worsening.  Triage Disposition: Go to ED Now (Notify PCP)  Patient/caregiver understands and will follow disposition?: Unsure        Reason for Disposition  SEVERE rectal bleeding (e.g., large blood clots; constant or on and off bleeding)  Answer Assessment - Initial Assessment Questions This RN recommended pt be examined in hospital asap. 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 Advised get to ED right away, pt verbalized understanding.     Bleeding for past few days and passed a clot Definitely bigger than a quarter and half-dollar, pretty big  4. ONSET: When was the blood first seen in the stools? (Days or weeks)      3-4 days ago  7. RECURRENT SYMPTOMS: Have you had blood in your stools before? If Yes, ask: When was the last time? and What happened that time?      Have had blood clot before and been seen for that, but nowhere near as big Sigmoidoscopy in past for this but was incomplete since still had some stool in bowel Internal hemorrhoids  9. OTHER SYMPTOMS: Do you have any other symptoms?  (e.g., abdomen pain, vomiting, dizziness, fever)     Wincing in pain on phone - pt confirms pain in back really really sharp throbbing pain lower back body jerks with every throb Abdominal cramping right before BM Some dizziness - hx anemia Chills  Denies: Too weak to stand Cold/clammy skin  Protocols used: Rectal Bleeding-A-AH

## 2024-09-23 NOTE — Telephone Encounter (Signed)
 Spoke with patient. Advised if symptoms worsen to report to ED. She states that she didn't go to ED due to road conditions, but if worse will call clinic back to be worked in. CB.

## 2024-10-02 ENCOUNTER — Ambulatory Visit: Payer: Self-pay | Admitting: Internal Medicine

## 2024-10-09 ENCOUNTER — Ambulatory Visit (HOSPITAL_COMMUNITY): Payer: Self-pay

## 2024-10-18 ENCOUNTER — Ambulatory Visit: Payer: Self-pay | Admitting: Internal Medicine

## 2024-10-31 ENCOUNTER — Ambulatory Visit: Payer: Self-pay
